# Patient Record
Sex: Female | Born: 1940 | Race: White | Hispanic: No
Health system: Southern US, Community
[De-identification: ages and names within clinical notes are randomized; demographics above are authoritative.]

## PROBLEM LIST (undated history)

## (undated) DIAGNOSIS — T4145XA Adverse effect of unspecified anesthetic, initial encounter: Secondary | ICD-10-CM

## (undated) DIAGNOSIS — I4891 Unspecified atrial fibrillation: Secondary | ICD-10-CM

## (undated) DIAGNOSIS — F419 Anxiety disorder, unspecified: Secondary | ICD-10-CM

## (undated) DIAGNOSIS — I10 Essential (primary) hypertension: Secondary | ICD-10-CM

## (undated) DIAGNOSIS — E538 Deficiency of other specified B group vitamins: Secondary | ICD-10-CM

## (undated) DIAGNOSIS — E785 Hyperlipidemia, unspecified: Secondary | ICD-10-CM

## (undated) DIAGNOSIS — E039 Hypothyroidism, unspecified: Secondary | ICD-10-CM

## (undated) DIAGNOSIS — F329 Major depressive disorder, single episode, unspecified: Secondary | ICD-10-CM

## (undated) DIAGNOSIS — T8859XA Other complications of anesthesia, initial encounter: Secondary | ICD-10-CM

## (undated) DIAGNOSIS — K219 Gastro-esophageal reflux disease without esophagitis: Secondary | ICD-10-CM

## (undated) DIAGNOSIS — G4730 Sleep apnea, unspecified: Secondary | ICD-10-CM

## (undated) DIAGNOSIS — F32A Depression, unspecified: Secondary | ICD-10-CM

## (undated) DIAGNOSIS — J069 Acute upper respiratory infection, unspecified: Secondary | ICD-10-CM

## (undated) DIAGNOSIS — I509 Heart failure, unspecified: Secondary | ICD-10-CM

## (undated) DIAGNOSIS — E271 Primary adrenocortical insufficiency: Secondary | ICD-10-CM

## (undated) DIAGNOSIS — G43909 Migraine, unspecified, not intractable, without status migrainosus: Secondary | ICD-10-CM

## (undated) DIAGNOSIS — M1990 Unspecified osteoarthritis, unspecified site: Secondary | ICD-10-CM

## (undated) DIAGNOSIS — R002 Palpitations: Secondary | ICD-10-CM

## (undated) DIAGNOSIS — I2510 Atherosclerotic heart disease of native coronary artery without angina pectoris: Secondary | ICD-10-CM

## (undated) DIAGNOSIS — R0602 Shortness of breath: Secondary | ICD-10-CM

## (undated) DIAGNOSIS — N390 Urinary tract infection, site not specified: Secondary | ICD-10-CM

## (undated) DIAGNOSIS — Z933 Colostomy status: Secondary | ICD-10-CM

## (undated) DIAGNOSIS — J42 Unspecified chronic bronchitis: Secondary | ICD-10-CM

## (undated) DIAGNOSIS — M5416 Radiculopathy, lumbar region: Secondary | ICD-10-CM

## (undated) DIAGNOSIS — M549 Dorsalgia, unspecified: Secondary | ICD-10-CM

## (undated) DIAGNOSIS — E559 Vitamin D deficiency, unspecified: Secondary | ICD-10-CM

## (undated) HISTORY — PX: TONSILLECTOMY: SUR1361

## (undated) HISTORY — DX: Unspecified osteoarthritis, unspecified site: M19.90

## (undated) HISTORY — PX: JOINT REPLACEMENT: SHX530

## (undated) HISTORY — PX: REVISION TOTAL KNEE ARTHROPLASTY: SUR1280

## (undated) HISTORY — PX: TOTAL KNEE ARTHROPLASTY: SHX125

## (undated) HISTORY — PX: CATARACT EXTRACTION, BILATERAL: SHX1313

## (undated) HISTORY — DX: Deficiency of other specified B group vitamins: E53.8

## (undated) HISTORY — DX: Unspecified atrial fibrillation: I48.91

## (undated) HISTORY — PX: CARPAL TUNNEL RELEASE: SHX101

## (undated) HISTORY — DX: Heart failure, unspecified: I50.9

## (undated) HISTORY — DX: Vitamin D deficiency, unspecified: E55.9

## (undated) HISTORY — PX: LAPAROSCOPIC CHOLECYSTECTOMY: SUR755

## (undated) HISTORY — DX: Dorsalgia, unspecified: M54.9

## (undated) HISTORY — DX: Radiculopathy, lumbar region: M54.16

## (undated) HISTORY — DX: Palpitations: R00.2

## (undated) HISTORY — PX: KNEE ARTHROSCOPY: SHX127

## (undated) HISTORY — DX: Shortness of breath: R06.02

## (undated) HISTORY — PX: COLON SURGERY: SHX602

## (undated) HISTORY — PX: BACK SURGERY: SHX140

## (undated) HISTORY — PX: DIAGNOSTIC LAPAROSCOPY: SUR761

## (undated) HISTORY — PX: DILATION AND CURETTAGE OF UTERUS: SHX78

---

## 1978-04-03 HISTORY — PX: ANTERIOR LUMBAR FUSION: SHX1170

## 1997-12-03 ENCOUNTER — Ambulatory Visit (HOSPITAL_COMMUNITY): Admission: RE | Admit: 1997-12-03 | Discharge: 1997-12-03 | Payer: Self-pay | Admitting: Orthopedic Surgery

## 1998-05-26 ENCOUNTER — Ambulatory Visit (HOSPITAL_COMMUNITY): Admission: RE | Admit: 1998-05-26 | Discharge: 1998-05-26 | Payer: Self-pay | Admitting: Cardiology

## 1998-05-26 ENCOUNTER — Encounter: Payer: Self-pay | Admitting: Cardiology

## 1998-10-14 ENCOUNTER — Ambulatory Visit (HOSPITAL_BASED_OUTPATIENT_CLINIC_OR_DEPARTMENT_OTHER): Admission: RE | Admit: 1998-10-14 | Discharge: 1998-10-14 | Payer: Self-pay | Admitting: Orthopedic Surgery

## 1999-05-10 ENCOUNTER — Ambulatory Visit (HOSPITAL_BASED_OUTPATIENT_CLINIC_OR_DEPARTMENT_OTHER): Admission: RE | Admit: 1999-05-10 | Discharge: 1999-05-10 | Payer: Self-pay | Admitting: Orthopedic Surgery

## 1999-05-11 ENCOUNTER — Encounter: Payer: Self-pay | Admitting: Cardiology

## 1999-05-11 ENCOUNTER — Encounter: Admission: RE | Admit: 1999-05-11 | Discharge: 1999-05-11 | Payer: Self-pay | Admitting: Cardiology

## 1999-06-20 ENCOUNTER — Other Ambulatory Visit: Admission: RE | Admit: 1999-06-20 | Discharge: 1999-06-20 | Payer: Self-pay | Admitting: Obstetrics and Gynecology

## 1999-06-27 ENCOUNTER — Ambulatory Visit (HOSPITAL_COMMUNITY): Admission: RE | Admit: 1999-06-27 | Discharge: 1999-06-27 | Payer: Self-pay | Admitting: Neurosurgery

## 1999-06-27 ENCOUNTER — Encounter: Payer: Self-pay | Admitting: Neurosurgery

## 2000-06-26 ENCOUNTER — Other Ambulatory Visit: Admission: RE | Admit: 2000-06-26 | Discharge: 2000-06-26 | Payer: Self-pay | Admitting: Obstetrics and Gynecology

## 2000-07-04 ENCOUNTER — Ambulatory Visit (HOSPITAL_COMMUNITY): Admission: RE | Admit: 2000-07-04 | Discharge: 2000-07-04 | Payer: Self-pay | Admitting: Obstetrics and Gynecology

## 2000-07-04 ENCOUNTER — Encounter: Payer: Self-pay | Admitting: Obstetrics and Gynecology

## 2000-07-10 ENCOUNTER — Ambulatory Visit (HOSPITAL_BASED_OUTPATIENT_CLINIC_OR_DEPARTMENT_OTHER): Admission: RE | Admit: 2000-07-10 | Discharge: 2000-07-10 | Payer: Self-pay | Admitting: Plastic Surgery

## 2000-07-10 ENCOUNTER — Encounter (INDEPENDENT_AMBULATORY_CARE_PROVIDER_SITE_OTHER): Payer: Self-pay | Admitting: Specialist

## 2001-07-17 ENCOUNTER — Other Ambulatory Visit: Admission: RE | Admit: 2001-07-17 | Discharge: 2001-07-17 | Payer: Self-pay | Admitting: Obstetrics and Gynecology

## 2001-09-08 ENCOUNTER — Emergency Department (HOSPITAL_COMMUNITY): Admission: EM | Admit: 2001-09-08 | Discharge: 2001-09-08 | Payer: Self-pay

## 2002-12-17 ENCOUNTER — Encounter: Payer: Self-pay | Admitting: Specialist

## 2002-12-19 ENCOUNTER — Inpatient Hospital Stay (HOSPITAL_COMMUNITY): Admission: RE | Admit: 2002-12-19 | Discharge: 2002-12-25 | Payer: Self-pay | Admitting: Specialist

## 2002-12-21 ENCOUNTER — Encounter: Payer: Self-pay | Admitting: Specialist

## 2003-12-28 ENCOUNTER — Encounter: Admission: RE | Admit: 2003-12-28 | Discharge: 2003-12-28 | Payer: Self-pay | Admitting: Internal Medicine

## 2004-10-18 ENCOUNTER — Ambulatory Visit (HOSPITAL_COMMUNITY): Admission: RE | Admit: 2004-10-18 | Discharge: 2004-10-18 | Payer: Self-pay | Admitting: Orthopedic Surgery

## 2004-10-18 ENCOUNTER — Ambulatory Visit (HOSPITAL_BASED_OUTPATIENT_CLINIC_OR_DEPARTMENT_OTHER): Admission: RE | Admit: 2004-10-18 | Discharge: 2004-10-19 | Payer: Self-pay | Admitting: Orthopedic Surgery

## 2005-04-14 ENCOUNTER — Inpatient Hospital Stay (HOSPITAL_COMMUNITY): Admission: RE | Admit: 2005-04-14 | Discharge: 2005-04-19 | Payer: Self-pay | Admitting: Orthopedic Surgery

## 2005-04-14 ENCOUNTER — Encounter (INDEPENDENT_AMBULATORY_CARE_PROVIDER_SITE_OTHER): Payer: Self-pay | Admitting: Specialist

## 2005-04-28 ENCOUNTER — Ambulatory Visit (HOSPITAL_COMMUNITY): Admission: RE | Admit: 2005-04-28 | Discharge: 2005-04-28 | Payer: Self-pay | Admitting: Internal Medicine

## 2005-05-23 ENCOUNTER — Ambulatory Visit (HOSPITAL_COMMUNITY): Admission: RE | Admit: 2005-05-23 | Discharge: 2005-05-23 | Payer: Self-pay | Admitting: Internal Medicine

## 2005-11-22 ENCOUNTER — Ambulatory Visit (HOSPITAL_COMMUNITY): Admission: RE | Admit: 2005-11-22 | Discharge: 2005-11-22 | Payer: Self-pay | Admitting: Neurosurgery

## 2006-03-07 ENCOUNTER — Emergency Department (HOSPITAL_COMMUNITY): Admission: EM | Admit: 2006-03-07 | Discharge: 2006-03-07 | Payer: Self-pay | Admitting: Emergency Medicine

## 2007-01-16 ENCOUNTER — Ambulatory Visit: Payer: Self-pay | Admitting: Gastroenterology

## 2007-01-30 ENCOUNTER — Ambulatory Visit: Payer: Self-pay | Admitting: Gastroenterology

## 2007-01-30 ENCOUNTER — Encounter: Payer: Self-pay | Admitting: Gastroenterology

## 2007-03-22 ENCOUNTER — Encounter
Admission: RE | Admit: 2007-03-22 | Discharge: 2007-03-22 | Payer: Self-pay | Admitting: Physical Medicine and Rehabilitation

## 2007-04-30 ENCOUNTER — Encounter: Admission: RE | Admit: 2007-04-30 | Discharge: 2007-04-30 | Payer: Self-pay | Admitting: Endocrinology

## 2007-05-30 ENCOUNTER — Encounter: Admission: RE | Admit: 2007-05-30 | Discharge: 2007-05-30 | Payer: Self-pay | Admitting: Internal Medicine

## 2008-10-21 ENCOUNTER — Inpatient Hospital Stay (HOSPITAL_COMMUNITY): Admission: RE | Admit: 2008-10-21 | Discharge: 2008-10-26 | Payer: Self-pay | Admitting: Orthopedic Surgery

## 2008-11-17 DIAGNOSIS — I10 Essential (primary) hypertension: Secondary | ICD-10-CM | POA: Insufficient documentation

## 2008-11-17 DIAGNOSIS — E785 Hyperlipidemia, unspecified: Secondary | ICD-10-CM | POA: Insufficient documentation

## 2008-11-18 ENCOUNTER — Ambulatory Visit: Payer: Self-pay | Admitting: Critical Care Medicine

## 2008-11-18 DIAGNOSIS — R918 Other nonspecific abnormal finding of lung field: Secondary | ICD-10-CM | POA: Insufficient documentation

## 2008-11-18 DIAGNOSIS — M1990 Unspecified osteoarthritis, unspecified site: Secondary | ICD-10-CM | POA: Insufficient documentation

## 2008-11-22 ENCOUNTER — Encounter: Payer: Self-pay | Admitting: Critical Care Medicine

## 2009-07-31 ENCOUNTER — Emergency Department (HOSPITAL_COMMUNITY): Admission: EM | Admit: 2009-07-31 | Discharge: 2009-07-31 | Payer: Self-pay | Admitting: Family Medicine

## 2009-08-25 ENCOUNTER — Ambulatory Visit (HOSPITAL_COMMUNITY): Admission: RE | Admit: 2009-08-25 | Discharge: 2009-08-26 | Payer: Self-pay | Admitting: Orthopedic Surgery

## 2009-11-07 ENCOUNTER — Emergency Department (HOSPITAL_COMMUNITY): Admission: EM | Admit: 2009-11-07 | Discharge: 2009-11-07 | Payer: Self-pay | Admitting: Emergency Medicine

## 2009-11-07 ENCOUNTER — Emergency Department (HOSPITAL_COMMUNITY): Admission: EM | Admit: 2009-11-07 | Discharge: 2009-11-07 | Payer: Self-pay | Admitting: Family Medicine

## 2009-11-29 ENCOUNTER — Encounter: Payer: Self-pay | Admitting: Gastroenterology

## 2010-05-03 NOTE — Letter (Signed)
Summary: Colonoscopy Letter  [facility]  [address] [place], [place] [address]   Phone: [phone]  Fax: [phone]      November 29, 2009 MRN: [id number]   [name] [address] [place], [place]  [address]   Dear Ms. [name],   According to your medical record, it is time for you to schedule a Colonoscopy. The American Cancer Society recommends this procedure as a method to detect early colon cancer. Patients with a family history of colon cancer, or a personal history of colon polyps or inflammatory bowel disease are at increased risk.  This letter has been generated based on the recommendations made at the time of your procedure. If you feel that in your particular situation this may no longer apply, please contact our office.  Please call our office at [phone] to schedule this appointment or to update your records at your earliest convenience.  Thank you for cooperating with [place] to provide you with the very best care possible.   Sincerely,  Rachael Fee, M.D.  [facility] [phone]

## 2010-06-17 LAB — CBC
HCT: 37 % (ref 36.0–46.0)
MCV: 88.7 fL (ref 78.0–100.0)
Platelets: 223 10*3/uL (ref 150–400)
RBC: 4.17 MIL/uL (ref 3.87–5.11)
RDW: 12.5 % (ref 11.5–15.5)
WBC: 7.8 10*3/uL (ref 4.0–10.5)

## 2010-06-17 LAB — URINALYSIS, ROUTINE W REFLEX MICROSCOPIC
Bilirubin Urine: NEGATIVE
Glucose, UA: NEGATIVE mg/dL
Hgb urine dipstick: NEGATIVE
Ketones, ur: NEGATIVE mg/dL
Nitrite: NEGATIVE
Protein, ur: NEGATIVE mg/dL
Specific Gravity, Urine: 1.02 (ref 1.005–1.030)
Urobilinogen, UA: 1 mg/dL (ref 0.0–1.0)
pH: 6 (ref 5.0–8.0)

## 2010-06-17 LAB — DIFFERENTIAL
Basophils Absolute: 0 10*3/uL (ref 0.0–0.1)
Basophils Relative: 0 % (ref 0–1)
Eosinophils Absolute: 0.3 10*3/uL (ref 0.0–0.7)
Monocytes Absolute: 0.8 10*3/uL (ref 0.1–1.0)

## 2010-06-17 LAB — URINE MICROSCOPIC-ADD ON

## 2010-06-17 LAB — POCT CARDIAC MARKERS
CKMB, poc: <1 ng/mL — ABNORMAL LOW (ref 1.0–8.0)
Myoglobin, poc: 38.6 ng/mL (ref 12–200)
Troponin i, poc: <0.05 ng/mL (ref 0.00–0.09)

## 2010-06-17 LAB — COMPREHENSIVE METABOLIC PANEL
ALT: 20 U/L (ref 0–35)
Alkaline Phosphatase: 74 U/L (ref 39–117)
CO2: 25 mEq/L (ref 19–32)
Calcium: 9.3 mg/dL (ref 8.4–10.5)
Glucose, Bld: 105 mg/dL — ABNORMAL HIGH (ref 70–99)
Potassium: 4 mEq/L (ref 3.5–5.1)

## 2010-06-20 LAB — COMPREHENSIVE METABOLIC PANEL
Albumin: 3.4 g/dL — ABNORMAL LOW (ref 3.5–5.2)
BUN: 11 mg/dL (ref 6–23)
Creatinine, Ser: 0.78 mg/dL (ref 0.4–1.2)
Total Bilirubin: 0.6 mg/dL (ref 0.3–1.2)
Total Protein: 6.5 g/dL (ref 6.0–8.3)

## 2010-06-20 LAB — URINALYSIS, ROUTINE W REFLEX MICROSCOPIC
Bilirubin Urine: NEGATIVE
Hgb urine dipstick: NEGATIVE
Ketones, ur: NEGATIVE mg/dL
Protein, ur: NEGATIVE mg/dL
Urobilinogen, UA: 1 mg/dL (ref 0.0–1.0)
pH: 5 (ref 5.0–8.0)

## 2010-06-20 LAB — CBC
HCT: 37.5 % (ref 36.0–46.0)
MCV: 92.8 fL (ref 78.0–100.0)
Platelets: 253 10*3/uL (ref 150–400)
RDW: 12.8 % (ref 11.5–15.5)

## 2010-06-20 LAB — PROTIME-INR
INR: 1.01 (ref 0.00–1.49)
Prothrombin Time: 13.2 seconds (ref 11.6–15.2)

## 2010-06-20 LAB — APTT: aPTT: 26 seconds (ref 24–37)

## 2010-06-21 LAB — POCT I-STAT, CHEM 8
BUN: 11 mg/dL (ref 6–23)
Calcium, Ion: 1.09 mmol/L — ABNORMAL LOW (ref 1.12–1.32)
Chloride: 99 mEq/L (ref 96–112)
Potassium: 4 mEq/L (ref 3.5–5.1)
Sodium: 135 mEq/L (ref 135–145)

## 2010-06-21 LAB — CBC
Hemoglobin: 13.8 g/dL (ref 12.0–15.0)
RBC: 4.25 MIL/uL (ref 3.87–5.11)
RDW: 12.1 % (ref 11.5–15.5)

## 2010-06-21 LAB — DIFFERENTIAL
Basophils Absolute: 0 10*3/uL (ref 0.0–0.1)
Lymphocytes Relative: 14 % (ref 12–46)
Monocytes Absolute: 1.1 10*3/uL — ABNORMAL HIGH (ref 0.1–1.0)
Neutro Abs: 7.6 10*3/uL (ref 1.7–7.7)
Neutrophils Relative %: 75 % (ref 43–77)

## 2010-07-10 LAB — CBC
HCT: 30.2 % — ABNORMAL LOW (ref 36.0–46.0)
HCT: 32.3 % — ABNORMAL LOW (ref 36.0–46.0)
Hemoglobin: 10.3 g/dL — ABNORMAL LOW (ref 12.0–15.0)
Hemoglobin: 11.1 g/dL — ABNORMAL LOW (ref 12.0–15.0)
Hemoglobin: 13.5 g/dL (ref 12.0–15.0)
MCHC: 34.1 g/dL (ref 30.0–36.0)
MCHC: 35 g/dL (ref 30.0–36.0)
MCHC: 34.5 g/dL (ref 30.0–36.0)
MCV: 93 fL (ref 78.0–100.0)
MCV: 93.1 fL (ref 78.0–100.0)
MCV: 93.4 fL (ref 78.0–100.0)
Platelets: 214 10*3/uL (ref 150–400)
Platelets: 243 10*3/uL (ref 150–400)
Platelets: 260 10*3/uL (ref 150–400)
RBC: 3.23 MIL/uL — ABNORMAL LOW (ref 3.87–5.11)
RDW: 12.4 % (ref 11.5–15.5)
RDW: 12.5 % (ref 11.5–15.5)
RDW: 12.3 % (ref 11.5–15.5)
WBC: 15.9 10*3/uL — ABNORMAL HIGH (ref 4.0–10.5)

## 2010-07-10 LAB — URINE MICROSCOPIC-ADD ON

## 2010-07-10 LAB — COMPREHENSIVE METABOLIC PANEL
ALT: 20 U/L (ref 0–35)
AST: 26 U/L (ref 0–37)
Albumin: 3.6 g/dL (ref 3.5–5.2)
Alkaline Phosphatase: 84 U/L (ref 39–117)
BUN: 11 mg/dL (ref 6–23)
Calcium: 9.1 mg/dL (ref 8.4–10.5)
GFR calc Af Amer: >60 mL/min (ref >60)
GFR calc non Af Amer: >60 mL/min (ref >60)
Potassium: 3.5 mEq/L (ref 3.5–5.1)
Sodium: 139 mEq/L (ref 135–145)
Total Bilirubin: 0.7 mg/dL (ref 0.3–1.2)
Total Protein: 6.6 g/dL (ref 6.0–8.3)

## 2010-07-10 LAB — URINALYSIS, ROUTINE W REFLEX MICROSCOPIC
Bilirubin Urine: NEGATIVE
Glucose, UA: NEGATIVE mg/dL
Nitrite: NEGATIVE
Protein, ur: NEGATIVE mg/dL
Specific Gravity, Urine: 1.013 (ref 1.005–1.030)
Specific Gravity, Urine: 1.017 (ref 1.005–1.030)
Urobilinogen, UA: 0.2 mg/dL (ref 0.0–1.0)
pH: 6 (ref 5.0–8.0)
pH: 5.5 (ref 5.0–8.0)

## 2010-07-10 LAB — GLUCOSE, CAPILLARY: Glucose-Capillary: 157 mg/dL — ABNORMAL HIGH (ref 70–99)

## 2010-07-10 LAB — BASIC METABOLIC PANEL
BUN: 11 mg/dL (ref 6–23)
CO2: 26 mEq/L (ref 19–32)
CO2: 27 mEq/L (ref 19–32)
Calcium: 8.3 mg/dL — ABNORMAL LOW (ref 8.4–10.5)
Chloride: 103 mEq/L (ref 96–112)
Chloride: 106 mEq/L (ref 96–112)
Creatinine, Ser: 0.63 mg/dL (ref 0.4–1.2)
GFR calc Af Amer: >60 mL/min (ref >60)
Glucose, Bld: 179 mg/dL — ABNORMAL HIGH (ref 70–99)
Potassium: 4.1 mEq/L (ref 3.5–5.1)
Sodium: 137 mEq/L (ref 135–145)
Sodium: 140 mEq/L (ref 135–145)

## 2010-07-10 LAB — CARDIAC PANEL(CRET KIN+CKTOT+MB+TROPI): Relative Index: 1.7 (ref 0.0–2.5)

## 2010-07-10 LAB — PROTIME-INR
INR: 1.5 (ref 0.00–1.49)
INR: 2.1 — ABNORMAL HIGH (ref 0.00–1.49)
Prothrombin Time: 18.6 seconds — ABNORMAL HIGH (ref 11.6–15.2)
Prothrombin Time: 24.1 seconds — ABNORMAL HIGH (ref 11.6–15.2)
Prothrombin Time: 13 seconds (ref 11.6–15.2)

## 2010-07-10 LAB — URINE CULTURE
Colony Count: NO GROWTH
Special Requests: NEGATIVE

## 2010-07-10 LAB — TYPE AND SCREEN
ABO/RH(D): B POS
Antibody Screen: NEGATIVE

## 2010-08-16 NOTE — Op Note (Signed)
[name], [name]              ACCOUNT NO.:  [id number]   MEDICAL RECORD NO.:  [id number]          PATIENT TYPE:  INP   LOCATION:  0009                         FACILITY:  [facility]   PHYSICIAN:  Ollen Gross, M.D.    DATE OF BIRTH:  [birth year]   DATE OF PROCEDURE:  10/21/2008  DATE OF DISCHARGE:                               OPERATIVE REPORT   PREOPERATIVE DIAGNOSIS:  Osteoarthritis, left knee.   POSTOPERATIVE DIAGNOSIS:  Osteoarthritis, left knee.   PROCEDURE:  Left total knee arthroplasty.   SURGEON:  Aluisio.   ASSISTANT:  Avel Peace PA-C.   ANESTHESIA:  Spinal.   ESTIMATED BLOOD LOSS:  Minimal.   DRAINS:  None.   TOURNIQUET TIME:  26 minutes at 3 mmHg.   COMPLICATIONS:  None.   CONDITION:  Stable to recovery.   BRIEF CLINICAL NOTE:  [name] is a 70 year old female with end-  stage arthritis, left knee, progressively worsening pain and  dysfunction.  She has failed nonoperative management, including  injections and presents for total knee arthroplasty.   PROCEDURE IN DETAIL:  After successful administration of spinal  anesthetic, a tourniquet is placed on her left thigh, and her left lower  extremity is prepped and draped in usual sterile fashion.  The extremity  is wrapped in Esmarch, knee flexed, tourniquet inflated to 300 mmHg.  Midline incision was made with a 10 blade through the subcutaneous  tissue to the level of the extensor mechanism.  A fresh blade is used to  make a medial parapatellar arthrotomy.  Soft tissue on the proximal  medial tibia is subperiosteally elevated to the joint line with the  knife into the semimembranosus bursa with a Cobb elevator.  Soft tissue  laterally is elevated with attention being paid to avoid the patellar  tendon on the tibial tubercle.  The patella subluxed laterally, knee  flexed 90 degrees, and ACL and PCL removed.  A drill was used to create  a starting hole in the distal femur, and the canal was thoroughly  irrigated.  A 5-degree left valgus alignment guide is placed.  Referencing off the posterior condyles, rotations were marked, and a  black pinned to remove 10 mm off the distal femur.  Distal femoral  resection is made with an oscillating saw.  Sizing blocks were placed  and size 3 is most appropriate.  Rotation is marked at the epicondylar  axis.  A Size 3 cutting block is placed, and the anterior, posterior,  and chamfer cuts are made.   The tibia is subluxed forward and menisci are removed.  Extramedullary  tibial alignment guide is placed, referencing proximally at the medial  aspect of the tibial tubercle and distally along the second metatarsal  axis and tibial crest.  Blocks pinned to remove 2 mm off the more  deficient medial side.  Tibial resection is made with an oscillating  saw.  Size 3 is the most appropriate tibial component.  Proximal tibia  is prepared with a modular drill and keel punch for the size 3.  Femoral  preparation is completed with the intercondylar cut.  Size 3 mobile bearing tibial trial and size 3 posterior stabilized  femoral trial, and a 10 mm posterior stabilized rotating platform insert  trial are placed.  With a 10, full extension is achieved with excellent  varus-valgus and anterior-posterior balance throughout full range of  motion.  Patella was everted, and thickness measured to be 23 mm.  Freehand resection is taken at 13 mm, a 38 template is placed, lug holes  were drilled, trial patella was placed, and it tracks normally.  Osteophytes are removed off the posterior femur with a trial in place.  All trials are removed, and cut bone surfaces are prepared with  pulsatile lavage.  The cement is mixed and once ready for implantation,  the size 3 mobile bearing tibial tray,size 3 posterior stabilized femur,  and 38 patella are cemented into place.  The patella was held a clamp.  Trial 12.5-mm inserts placed, the knee held in full extension.  All   extruded cement removed.  When the cement was fully hardened, then the  permanent 12.5 mm posterior stabilized rotating platform insert was  placed into the tibial tray.  Please note that the 12.5 was the trial  size that was also most stable.  Once the permanent one was in, we  thoroughly irrigated the joint with saline solution and then injected  FloSeal in the posterior capsule, mediolateral gutters, and  suprapatellar area.  Moist sponge is placed, and tourniquet released for  a total tourniquet time of 26 minutes.  The sponge is held for 2 minutes  and removed.  Minimal bleeding is encountered.  The bleeding that is  encountered is stopped with electrocautery.  The wound is again  irrigated to remove the FloSeal, and then the arthrotomy closed with  interrupted #1 PDS.  Flexion against gravity is 140 degrees.  Subcu is  closed with interrupted 2-0 Vicryl, and subcuticular running 4-0  Monocryl.  The incision is cleaned and dried and Steri-Strips and a  bulky sterile dressing were applied.  She was then placed into a knee  immobilizer, awakened, and transported to recovery in stable condition.      Ollen Gross, M.D.  Electronically Signed     FA/MEDQ  D:  10/21/2008  T:  10/21/2008  Job:  [id number]

## 2010-08-16 NOTE — Consult Note (Signed)
[name], Scott              ACCOUNT NO.:  [id number]   MEDICAL RECORD NO.:  [id number]          PATIENT TYPE:  INP   LOCATION:  1616                         FACILITY:  [facility]   PHYSICIAN:  Francisca December, M.D.  DATE OF BIRTH:  1941/03/18   DATE OF CONSULTATION:  10/21/2008  DATE OF DISCHARGE:                                 CONSULTATION   REFERRING PHYSICIAN:  Ollen Gross, M.D.   REASON FOR CONSULTATION:  Episode of bradycardia.   FINDINGS:  1. Episode of hypotension, bradycardia 3 hours following spinal      anesthesia, etiology uncertain.  2. No associated symptoms with above, no hypoxia.  3. Status post TKR today.  4. 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  5. Pituitary microadenoma.  6. Hypertension.  7. Hyperlipidemia.  8. Osteoarthritis.  9. Osteopenia.  10.History of atrial bigeminy  11.Normal pharmacologic stress Cardiolite performed March 2010.   RECOMMENDATIONS:  1. Agree with your measures thus far which include saline bolus and      transfer to telemetry.  2. Cycle cardiac enzymes.  3. Agree with stress dose steroids.  4. Any further episodes especially involving any chest discomfort or      hypoxemia would recommend CT angiogram.   FINDINGS:  [name] is a pleasant 70 year old woman who underwent  today a left TKR which was uncomplicated and had an unremarkable  recovery in PACU.  After transfer to the ward while on O2 saturation  monitor she was noted to drop her heart rate into the 30s and then  became briefly hypotensive with systolic blood pressure in the 80s.  This episode lasted approximately 10-15 minutes.  She was given IV  saline bolus.  At the time of my evaluation now 90 minutes later, she  has normal heart rate and blood pressure.  The patient denied any  associated symptoms with this, specifically any chest discomfort,  shortness of breath, diaphoresis, nausea or visual changes.  She feels  fine  currently.  Her anesthetic from her knee surgery has yet to  completely wear off.   MEDICATIONS AS AN OUTPATIENT:  1. Aspirin 81 mg p.o. daily.  2. Flexeril 10 mg p.o. daily.  3. Diovan 80 mg p.o. daily.  4. Metoprolol 25 mg p.o. b.i.d.  5. Prozac 20 mg p.o. daily.  6. Fosamax 70 mg p.o. every week.  7. Simvastatin 40 mg p.o. daily.  8. Prilosec 20 mg p.o. daily.  9. Vitamin D 50,000 units weekly.   PHYSICAL EXAMINATION:  Blood pressure currently 94/55, pulse is 68 and  regular, respiratory rate 16, temperature 97.7.  GENERAL:  This is a well-appearing comfortable, supine Caucasian female  in no distress.  HEENT:  Unremarkable.  NECK:  Supple without thyromegaly or masses.  Carotid upstrokes are  normal without bruit.  There is no JVD.  CHEST:  Clear with adequate excursion.  HEART:  Has an irregularly irregular rhythm in the form of a bigeminal  pattern.  No murmur, click or rub is noted.  ABDOMEN:  Mildly obese, soft, nontender.  No midline pulsatile mass.  Bowel sounds present in all quadrants.  The left leg is in the postop  apparatus.  The right leg is without edema and has intact distal pulses.  NEUROLOGIC:  Exam is nonfocal.   Electrocardiogram shows sinus rhythm with atrial bigeminy and  nonspecific ST-T wave abnormality.   COMMENTS:  The etiology of this episode is unclear but probably related  to slow resolution of her spinal anesthetic.  I do not feel further  evaluation other than obtaining cardiac enzymes would be appropriate  given her lack of any associated symptoms and no hypoxemia.      Francisca December, M.D.  Electronically Signed     JHE/MEDQ  D:  10/21/2008  T:  10/21/2008  Job:  [id number]   cc:   Ollen Gross, M.D.  Fax: [phone]   Colleen Can. Deborah Chalk, M.D.  Fax: [phone]

## 2010-08-19 NOTE — Discharge Summary (Signed)
NAMEMARVELYN, Scott              ACCOUNT NO.:  [id number]   MEDICAL RECORD NO.:  [id number]          PATIENT TYPE:  INP   LOCATION:  1516                         [facility]ACILITY:  [facility]   PHYSICIAN:  Ollen Gross, M.D.    DATE OF BIRTH:  1940/12/14   DATE OF ADMISSION:  04/14/2005  DATE OF DISCHARGE:  04/19/2005                                 DISCHARGE SUMMARY   DISCHARGE DIAGNOSIS:  Painful, stiff right total knee arthroplasty.   OTHER DIAGNOSES:  1.  Central adrenal insufficiency.  2.  Hypertension.  3.  Fibromyalgia.  4.  Arthritis.   CONSULTS:  Dr. Guerry Bruin, internal medicine.   PROCEDURE:  Right total knee arthroplasty, April 14, 2005.   HISTORY OF PRESENT ILLNESS:  [name] is a 70 year old female who  originally underwent a right total knee arthroplasty approximately two years  prior to the present admission.  She has had progressively worsening pain  and stiffness in this right knee.  She has worsening condition over the past  year or so.  Bone scan was suggestive of loosening versus possible  infection.  Lab work was negative for infection.  She presents now for  revision versus resection arthroplasty of her right total knee.   HOSPITAL COURSE:  [name] was admitted via the operating room on  April 14, 2005, at which time she underwent a revision right total knee  arthroplasty.  Intraoperatively, her Gram's stain was negative, and multiple  frozen sections showed no evidence of acute inflammation.  She underwent the  procedure under general anesthesia with a postop Marcaine pain pump.  She  did extremely well with the operation.  Postoperatively, it was noted that  she was hypotensive with systolics running approximately 90 and diastolics  were running 40-50.  She has had this problem with previous surgeries.  She  was fluid resuscitated, and Dr. Wylene Simmer was consulted.  He felt that it was  possible that this may have been adrenal insufficiency  causing it.  Her  blood pressure medications were held.  On postoperative day #1, her pressure  was doing better.  She was still borderline low but not as low as the  initial postoperative night.  She had a hemoglobin of 10.3, and BMET was  within normal limits.  She was kept on bedrest and just bed to chair the  first day.   On postoperative day #2, the pressure was starting to normalize.  Hemoglobin  was 8.9.  She was starting to feel better as of postoperative day #2.  Her  PCA pump and Marcaine pain pump were discontinued.  She was placed on  hydrocortisone by Dr. Wylene Simmer, and she started to respond to that.   By postoperative day #3, blood pressure was 120/70.  She is kept on  steroids.  She was doing well with her physical therapy at this time.  Hemoglobin had leveled off at 8.6.  It was felt that she did not need a  transfusion.   By April 18, 2005, she was participating actively with physical therapy  and ready for discharge by April 19, 2005.  Her hemoglobin at that time  was 8.4 with a normal BMET and INR of 1.8.  She was ambulating over 120 feet  under supervised condition with the ability to go up and down two stairs.  She was cleared medically for discharge by Dr. Wylene Simmer.  She was  subsequently discharged home on April 19, 2005.   DISPOSITION:  Discharged home on April 19, 2005.  Tolerating a regular  diet.  In stable condition.  She is discharged home on Coumadin, Percocet,  Robaxin, and her preoperative medications.   FOLLOW UP:  In two weeks in the office with Dr. Despina Hick.  She will have home  health PT and home health RN for pro times.  A Coumadin will be in order for  three weeks postoperatively with an INR level to be monitored between 2-3.  We will see her back in two weeks.      Ollen Gross, M.D.  Electronically Signed     FA/MEDQ  D:  06/05/2005  T:  06/06/2005  Job:  [id number]   cc:   Gaspar Garbe, M.D.  Fax: [phone]

## 2010-08-19 NOTE — Op Note (Signed)
NAMECLAUDETTA, [name]              ACCOUNT NO.:  [id number]   MEDICAL RECORD NO.:  [id number]          PATIENT TYPE:  AMB   LOCATION:  DSC                          FACILITY:  [facility]   PHYSICIAN:  Leonides Grills, M.D.     DATE OF BIRTH:  12/31/1940   DATE OF PROCEDURE:  10/18/2004  DATE OF DISCHARGE:                                 OPERATIVE REPORT   PREOPERATIVE DIAGNOSIS:  Left great toe metatarsophalangeal joint arthritis.   POSTOPERATIVE DIAGNOSIS:  Left great toe metatarsophalangeal joint  arthritis.   OPERATION PERFORMED:  1.  Left great toe metatarsophalangeal joint fusion.  2.  Local bone graft.  3.  Stress x-rays, left foot.   SURGEON:  Leonides Grills, M.D.   ANESTHESIA:  General.   COMPLICATIONS:  None.   DISPOSITION:  Stable to PR.   INDICATIONS FOR PROCEDURE:  The patient is a 70 year old female who has had  longstanding left great toe pain that was interfering with her life to the  point where she could not do what she wants to do.  The patient  has  consented for the above procedure.  All risks which include infection,  neurovascular injury, nonunion, malunion, hardware irritation, hardware  failure, persistent pain, worsening pain, stiffness, arthritis and prolonged  recovery were all explained, questions were encouraged and answered.   DESCRIPTION OF PROCEDURE:  The patient was brought to the operating room and  placed in supine position after adequate general endotracheal tube  anesthesia was administered as well as Ancef 1 g IV piggyback.  The left  lower extremity was then prepped and draped in a sterile manner over a  proximally placed thigh tourniquet. The limb was gravity exsanguinated.  The  tourniquet was elevated at 290 mmHg.  A longitudinal incision over the  previous medial incision was then made.  Dissection was carried down through  the skin and hemostasis was obtained.  Neurovascular structures were  identified both superiorly and inferiorly and  protected throughout the case.  Capsulotomy was then made.  The first MTP was then entered.  There was  minimal if any cartilage left and this was curetted out with the curette and  a curved quarter inch osteotome.  The joint was then copiously irrigated  with normal saline and multiple 2 mm drill holes were placed on either side  of the joint.  The metatarsophalangeal joint was then provisionally fixed  with a 2.0 K-wire, allowing the proximal phalanx to be parallel to the  weightbearing surface of the floor.  This was then verified under C-arm  guidance in the AP and lateral planes to be in excellent position.  We then  placed two 3.5 mm full threaded cortical lag screws in a criss-cross manner  and this had excellent purchase and maintenance of the correction.  Graft  obtained through spurs around the joint as well as from drill bits were used  as local bone graft and placed back into the metatarsophalangeal joint using  a bur and placing stress-strain relieving bone graft along the joint surface  and joint line.  Final stress x-rays were obtained  in AP and lateral planes.  This showed no gross motion across the arthrodesis site.  Fixation and  proper position as well as toe is in excellent  alignment as well.  Prior to bone graft placement, the area was copiously  irrigated with normal saline.  Capsule was closed with 3-0 Vicryl.  The skin  was closed with 4-0 nylon.  Sterile dressing was applied.  A modified Jones  dressing was applied.  The patient was stable to the PR.       PB/MEDQ  D:  10/18/2004  T:  10/18/2004  Job:  [id number]

## 2010-08-19 NOTE — Op Note (Signed)
NAME:  KATIE, FARAONE                        ACCOUNT NO.:  [id number]   MEDICAL RECORD NO.:  [id number]                   PATIENT TYPE:  INP   LOCATION:  2550                                 FACILITY:  [facility]   PHYSICIAN:  Erasmo Leventhal, M.D.         DATE OF BIRTH:  03-11-1941   DATE OF PROCEDURE:  12/19/2002  DATE OF DISCHARGE:                                 OPERATIVE REPORT   PREOPERATIVE DIAGNOSIS:  Right knee end-stage osteoarthritis.   POSTOPERATIVE DIAGNOSIS:  Right knee end-stage osteoarthritis.   PROCEDURE:  Right total knee arthroplasty.   SURGEON:  Ellwood Dense, M.D.   ASSISTANT:  Lajoyce Corners, M.D.   ANESTHESIA:  General with femoral nerve block.   ESTIMATED BLOOD LOSS:  Less than 50 mL.   DRAINS:  Two medium Hemovac.   COMPLICATIONS:  None.   TOURNIQUET TIME:  1 hour 50 minutes at 350 mmHg.   IMPLANTS:  Osteonics components, all cemented, Scorpio, posterior-  stabilized, size 7 femur, size 7 tibia, 12 mm flex insert with a 26 mm  polyethylene patella.   OPERATIVE DETAILS:  The patient was counseled in the holding area.  The  correct size was identified.  An IV was started and antibiotics were given.  Taken to the OR, placed in the supine position, general anesthesia, Foley  catheter placed utilizing sterile technique by OR circulating nurse.   The right knee was examined, a 5 degree flexion contracture, flexion to 120  degrees.  Elevated, prepped with Duraprep, and all draped in a sterile  fashion.  All bony prominences were properly padded and neurovascular  structures were __________ off.  The right lower extremity was exsanguinated  with an Esmarch and the tourniquet inflated to 350 mmHg.  A straight midline  incision made through the skin and subcutaneous tissue, small veins  electrocoagulated.  Medial and lateral patellar soft tissue flaps were  developed.  A medial parapatellar arthrotomy was performed and proximally a  soft tissue release  was done at the appropriate level.  The patella was  everted and the knee was flexed.  End-stage arthritic change, bone-against-  bone changes.  The cruciate ligaments were resected, starter holes made in  the distal femur, the canal was irrigated, intramedullary canal finder was  placed.  We chose a 5 degree valgus cut of the right knee and took a 10 mm  cut off the distal femur.  The distal femur was found to be a size #7.  Rotational marks were made and it was cut to fit a size #7.  The medial and  lateral tibial eminence was resected, medial and lateral menisci were  removed, geniculate vessels were coagulated.  The proximal tibia was found  to be a size #7.  A starting hole was made, the step reamer was utilized,  the intramedullary rod was gently placed.  We chose a 0 degree slope and  took an 8 mm cut off  the proximal tibia.  Posteromedial and posterolateral  femoral osteophytes were removed under direct visualization.  The patella  was reamed to appropriate depth, locking holes were made, and excess bone  was removed to fit a size #26.  A delta keel was performed in the standard  fashion on the tibial plateau.  At this time the knee was irrigated with  pulsatile lavage copiously while the cement was properly prepared.  Utilizing Modern cement technique, all components were cemented in place, a  size 7 tibia, size 7 femur, with a 26 mm patella.  I also noted that the  femoral component had been notched properly prior to this.  After the cement  had cured with a size trial of 10, we then went to a 12 mm flex insert with  excellent range of motion and well-balanced in flexion and extension.  Patellar tracking was not lateral but tilting laterally.  A modified lateral  release was performed, giving anatomic tilt and more tracking.  At this time  the trial was removed, excess cement was removed, all components were  irrigated, and then a final component, the size 12 mm flex tibial insert  was  applied, posterior-stabilized.   Bone wax placed over the exposed bony surfaces.  Two medium Hemovac drains  were placed.   The knee was closed with layers, the arthrotomy with Vicryl, subcu Vicryl,  skin closed with subcuticular Monocryl suture.  Each layer thoroughly  irrigated with antibiotic solution during the closure.  A sterile  compressive dressing applied.  Tourniquet was deflated, normal pulses in the  foot and ankle at the end of the case.  An ice pack and knee immobilizer was  applied.  She had excellent pulses in the foot and ankle at the end of the  case.  She was given another gram of Ancef intravenously as the tourniquet  was deflated.   At this point in time Dr. Jean Rosenthal administered a femoral nerve block.  The  patient was then awakened.  She was taken out of the operating room in  stable condition.  Sponge and needle count were correct.  There were no  complications.                                               Erasmo Leventhal, M.D.    RAC/MEDQ  D:  12/19/2002  T:  12/20/2002  Job:  [id number]

## 2010-08-19 NOTE — Consult Note (Signed)
NAME:  [name], [name]                        ACCOUNT NO.:  [id number]   MEDICAL RECORD NO.:  [id number]                   PATIENT TYPE:  INP   LOCATION:  3306                                 FACILITY:  [facility]   PHYSICIAN:  Colleen Can. Deborah Chalk, M.D.            DATE OF BIRTH:  1941/04/01   DATE OF CONSULTATION:  12/20/2002  DATE OF DISCHARGE:                                   CONSULTATION   HISTORY OF PRESENT ILLNESS:  Thank you very much for asking me to see Abigail  .Scott for postoperative hypotension. She had right knee replacement on  December 19, 2002. During the operation she was hypotensive and required  Neo-Synephrine. She had persistent hypotension postoperatively in spite of  having albumin  as well as 2000 mL of Crystalloid lactated Ringer's. Her  preoperative hemoglobin was in the low to mid 30s and fell to the 27 range  postoperatively. She did not have any  chest pain or shortness of breath,  but because of the persistent hypotension and what was interpreted  as PACs,  she was referred for evaluation.   Her cardiac history includes a history of hypertensive heart disease. She  had a 2D echocardiogram  done in November 2003 which showed concentric left  ventricular hypertrophy with prominence of the septum and disproportionate  upper septal hypertrophy without obstruction. She had left atrial  enlargement and essentially normal valvular function. Her ejection fraction  was 78% at the time of her  stress Cardiolite study done with pharmacologic  stress on December 17, 2002. She did have a small  left ventricular cavity  at that point in time. She  had normal perfusion. She has had a Holter  monitor in the past which has shown frequent PACs and occasional  to  frequent PVCs.   PAST MEDICAL HISTORY:  1. Her past medical history is mainly remarkable for her osteoarthritis.  2. Cholecystectomy in 1992.  3. Incisional hernia in 1993.  4. Arthroscopy on the right knee in  1988.  5. History of right foot surgery.  6. She has been hospitalized for child birth x1.  7. Hypertension since age 74.  8. History of fibromyalgia.   ALLERGIES:  SULFA CAUSES  VAGINITIS.   FAMILY HISTORY:  Her father  died at age 32 of MI. Her mother died at age 7  of hypertension and stroke. There are no brothers or sisters. She has 1 son  who is alive and well.   SOCIAL HISTORY:  She is a retired Runner, broadcasting/film/video. Her husband is an Airline pilot. She  does not use tobacco or alcohol. She lives at home with her husband.   REVIEW OF SYSTEMS:  She has had some psychosocial stressors  in her life  but basically has been doing well with that. She sees Dr. Waynard Edwards for medical  care.   MEDICATIONS:  1. She has been on 5 mg of Ziac a day.  2. Flexeril.  3. Prozac 20 mg a day.  4. Diovan 80 mg a day.  5. Celebrex.   PHYSICAL EXAMINATION:  GENERAL:  Currently she is somewhat pale but she is  alert. Peripheral pulses are excellent including  pedal pulses. She is warm  and dry. Her weight on December 15, 2002, was 191-1/2 pounds.  VITAL SIGNS:  Her blood pressure in the recovery  area was 80/50, heart rate  80.  HEENT:  Negative.  NECK:  Supple, no jugular venous distention.  LUNGS:  Clear.  HEART:  Systolic outflow murmur.  ABDOMEN:  Soft, nontender.  EXTREMITIES:  Without edema.   LABORATORY DATA:  Her EKG shows sinus rhythm with nonspecific  ST and T-wave  changes. A portable  2D echocardiogram was performed by me. It showed  excellent systolic wall motion  but a small  hyperdynamic left ventricular  cavity. There did appear to be left ventricular hypertrophy. It was  technically difficult, but then limited somewhat because of a nontach echo  done as an  emergent basis.   Hematocrit 27.   IMPRESSION:  1. Hypotension, postoperatively, probably related to vasodilatation or     possibly volume depletion.  2. Hypertensive heart disease with recent  normal Adenosine Cardiolite study      but with small  concentric hypertrophied left ventricle.  3. Postoperative from a total right knee.   PLAN:  Will expand her  volume with transfusion and will try to get her  hematocrit  above  30. We will draw cardiac enzymes, but I am almost certain  that  they will be negative. I think as anesthesia wears off, we will see  improvement in blood pressure. We probably can reinstitute blood pressure  medications early in the postoperative course.                                                Colleen Can. Deborah Chalk, M.D.    SNT/MEDQ  D:  12/20/2002  T:  12/21/2002  Job:  [id number]   cc:   Erasmo Leventhal, M.D.  [address]  [place]  [place] [address]  Fax: [phone]   Tera Mater. Evlyn Kanner, M.D.  [address]  [place]  [place] [address]  Fax: [phone]

## 2010-08-19 NOTE — Op Note (Signed)
[name], [name]              ACCOUNT NO.:  [id number]   MEDICAL RECORD NO.:  [id number]          PATIENT TYPE:  INP   LOCATION:  1516                         FACILITY:  [facility]   PHYSICIAN:  Ollen Gross, M.D.    DATE OF BIRTH:  [birth year]   DATE OF PROCEDURE:  04/14/2005  DATE OF DISCHARGE:                                 OPERATIVE REPORT   PREOPERATIVE DIAGNOSIS:  Painful stiff right total knee.   POSTOPERATIVE DIAGNOSIS:  Painful stiff right total knee.   PROCEDURE:  Right total knee arthroplasty revision.   SURGEON:  Dr. Lequita Halt   ASSISTANT:  Avel Peace, PA-C   ANESTHESIA:  General with postop Marcaine pain pump.   ESTIMATED BLOOD LOSS:  100.   DRAINS:  Hemovac x1.   TOURNIQUET TIME:  Up 60 at 300 mmHg, down 10 minutes and then up an  additional 30 minutes at 300 mmHg.   COMPLICATIONS:  None.   CONDITION:  Stable to recovery.   BRIEF CLINICAL NOTE:  [name] is a 70 year old female, had a right  total knee arthroplasty done approximately 2 years ago with Dr. Thomasena Edis.  She has had progressively increasing pain and stiffness.  She has not had  signs of infection.  There is concern about loosening given her pain and  worsening dysfunction.  She has had extensive therapy without much  improvement.  She presents now for revision versus resection arthroplasty.   PROCEDURE IN DETAIL:  After successful administration of general anesthetic,  a tourniquet is placed high on the right thigh and right lower extremity  prepped and draped in the usual sterile fashion.  Extremity is wrapped in  Esmarch, knee flexed, tourniquet inflated 300 mmHg.  Midline incision is  made with a 10 blade through subcutaneous tissue to the level of the  extensor mechanism.  Fresh blade is used make a medial parapatellar  arthrotomy.  Soft tissue over the proximal and medial tibia is  subperiosteally elevated to the joint line with a knife and into the  semimembranosus bursa with a Cobb  elevator.  Soft tissue laterally is  elevated with attention being paid to avoiding the patellar tendon on the  tibial tubercle.  We excised a tremendous amount of scar tissue from  underneath the quadriceps tendon and under the patella tendon.  When I open  the joint, there is minimal fluid, and we sent that for stat Gram stain  which was negative.  In addition, I sent some of the synovium for frozen  section which was negative and then some inflamed-appearing tissue from  around the tibia which was negative.  Once the scar is excised, then I  removed the tibial polyethylene, and there was some evidence of some wear  around the post.  There was some pitting in the polyethylene around the post  and also posterior and central.  There was no delamination of the  polyethylene.  I removed the polyethylene and then using an osteotome,  disrupted the interface between the femoral component and bone removed  without with minimal if any bone loss.  On the tibial side,  by disrupting  the interface anteriorly with an oscillating saw and just placing osteotome  underneath the component, and it was easily removed, consistent with the  component being loose.  We then removed the cement from both canals and  thoroughly irrigated both canals.  On the femoral side, I reamed up to 18 mm  with good press fit on the tibial side and went to 13 mm with 13 mm cemented  stem.   I placed the 13 mm reamer intramedullary on the tibial side and put the  intramedullary cutting guide.  Removed about 2 mm to 3 mm from the tibial  surface so as to get a fresh bony surface.  We then prepared proximally with  the proximal reamer and then used the keel punch.  I removed the tibial  tray.  On the femoral side, we had the 18 mm medullary reamer and placed the  5-degree right valgus alignment guide over that.  We removed about 2 mm of  bone off the medial side and had to go up to a 4 mm slot on the lateral side  and thus  used a 4 mm distal augment with the component.  I then placed AP  cutting block for a size 3 which was the most appropriate size.  With a 15  mm spacer block, we marked the rotation to create a rectangular flexion gap.  The stem was in the +2 position to effectively raise the stem and lower the  anterior flange of the component so it would rest on the anterior cortex of  the femur.  Posteriorly, we would have to use 4 mm augments medial and  lateral to contact bone.  After this the cuts are made, then we placed the  intercondylar revision block to make the intercondylar cut.  All the stem  reamers are removed then.   The tibial trial was placed which is a size 2.5 with a 13 x 60 stem  extension and on the femoral side, we had the size 3 posterior stabilized  femur with a 75 x 18 stem extension 4 mm distal lateral augment and 4 mm  medial and lateral posterior augments.  The trials have excellent fit.  We  went up to a 15 mm insert, and she had excellent stability in full extension  down through 120 degrees of flexion.  On the patellar side, there was  minimal if any wear of the patella.  There was a lot of overhanging  osteophyte which was all removed.  All the overhanging soft tissues removed.  We essentially did a patellaplasty to remove all that.  The patella then is  tracking normally when the knee is flexed.  I then let the tourniquet down  for a total time of 60 minutes.  We kept it down for 10 minutes while the  components were assembled on the back table.  After 10 minutes, we rewrapped  the leg in Esmarch and reinflated to 300 mmHg.  The cement restrictor size  is a 5, and we placed a size 5 to the appropriate depth in the tibial canal.  The bone is then copiously irrigated with pulsatile lavage and the cement  mixed.  Once ready for implantation, the tibial component is cemented both  in the tibial canal as well as on the cut bone surface.  On the femoral side, we just cemented  distally and had a Press-Fit stem.  Trial 15 insert  is placed and knee held in  full extension, all extruded cement removed.  When the cement is fully hardened, then the permanent 15 mm posterior  stabilized rotating platform insert is placed in the tibial tray.  The wound  is copiously irrigated with saline solution and the extensor mechanism  closed over a Hemovac drain with interrupted #1 PDS.  Flexion against  gravity is 125 degrees.  Tourniquet is released for the second tourniquet  time of 30 minutes.  Subcu os closed with interrupted 2-0 Vicryl and the  skin with staples.  The catheter for the Marcaine pain pump was placed, and  the pump is initiated.  The Hemovac is hooked to suction.  A bulky sterile  dressing is applied. and she is placed into a knee immobilizer, awakened,  and transported to recovery in stable condition.      Ollen Gross, M.D.  Electronically Signed     FA/MEDQ  D:  04/14/2005  T:  04/15/2005  Job:  [id number]

## 2010-08-19 NOTE — Consult Note (Signed)
NAMEKESSIE, CROSTON              ACCOUNT NO.:  [id number]   MEDICAL RECORD NO.:  [id number]          PATIENT TYPE:  INP   LOCATION:  1516                         FACILITY:  [facility]   PHYSICIAN:  Gaspar Garbe, M.D.DATE OF BIRTH:  06-Jul-1940   DATE OF CONSULTATION:  04/14/2005  DATE OF DISCHARGE:                                   CONSULTATION   REFERRED BY:  Ollen Gross, M.D.   HISTORY OF PRESENT ILLNESS:  The patient is a 70 year old white female with  a history of hyperlipidemia, hypertension who was admitted by Dr. Ollen Gross for purpose of a right knee replacement revision. Of note, the  patient had had a hypotensive episode that occurred following right knee  revision in September 2004. I was asked to follow patient medically to try  to ensure that this does not happen. The patient required pressors at the  prior time. The patient has been seen in the office by Dr. Waynard Edwards on January  8 and no adjustments were made in her medications. The patient is currently  being seen in the post anesthesia care unit and currently waking up. She has  had one episode of nausea and vomiting and her blood pressure per her  anesthesia record is shown to be normotensive. Blood pressure is up slightly  secondary to her nausea. She is unable to give a full history as she is not  quite fully aware at this point.   ALLERGIES:  SULFA, VIOXX.   MEDICATIONS:  1.  Flexeril 10 mg q.h.s.  2.  Prozac 20 mg p.o. daily.  3.  Diovan 80 mg daily.  4.  Aspirin 81 mg daily.  5.  Crestor 10 mg daily.  6.  Metoprolol 25 mg b.i.d.  7.  Valium 5 mg p.r.n.   PAST MEDICAL HISTORY:  1.  Hypertension.  2.  Hyperlipidemia.  3.  Apparent glucose intolerance. Last A1c done this week was 6.0 and was      treated with diet and exercise.  4.  Fibromyalgia.  5.  Osteopenia.  6.  Gastroesophageal reflux disease.   PAST SURGICAL HISTORY:  Cholecystectomy in 1992, incisional hernia in 1993,  arthroscopy of  her right knee in 1988, right knee replacement September of  2004, revision today as noted. Left first toe fusion earlier this year,  carpal tunnel surgery in early 2000.   SOCIAL HISTORY:  The patient lives in [place] with her husband. 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   FAMILY HISTORY:  Mother died age 79 of a CVA, father died age 82 of an MI,  she is an only child.   REVIEW OF SYSTEMS:  Not currently obtainable but nausea and vomiting is  currently noted.   ADVANCED DIRECTIVES:  The patient is a full code.   PHYSICAL EXAMINATION:  VITAL SIGNS:  Pulse 105, blood pressure 137/76, 92%  on mask.  GENERAL:  The patient is somewhat nauseated.  HEENT:  Normocephalic, atraumatic. PERRL. EOMI. ENT within normal limits.  NECK:  Supple, no lymphadenopathy, JVD or bruit.  HEART:  Tachycardic, no murmur appreciated.  LUNGS:  Clear to auscultation bilaterally.  ABDOMEN:  Bowel sounds normoactive.  EXTREMITIES:  The patient's status post right knee revision and has normal  movement and sensation in her right lower extremity.  NEUROLOGIC:  The patient is mildly obtunded, as noted above.   Chest x-ray currently pending postoperatively. Prior testing, the patient  had a negative Cardiolite by [facility] in January 2006.   LABS PRIOR TO ADMISSION:  White count 7.9, hemoglobin 13.4, hematocrit 39.9,  platelets 270, BUN and creatinine 22 and 0.9 respectively. Sodium is 146,  potassium 4.9. LFTs within normal limits.   ASSESSMENT/PLAN:  1.  Status post right knee revision, pain management and anticoagulation per      orthopedics and mobility per them as well.  2.  Hypertension. Will set parameters for holding her blood pressure      medicines. If her blood pressure is above 140 in the morning, she will      receive her morning medicine and if not these will be held. If her blood      pressure drops below 110, she has instructions to receive  500 mL of      normal saline as a bolus and if she maintains under this level to call      MD.  If persists, will pursue an adrenal workup.  3.  Hyperlipidemia.  Will continue her Crestor 10 mg a day.  4.  Depression. Continue her Prozac 20 mg a day.   Thank you for allowing me to consult on this patient. I will continue to  follow her while she is in house.      Gaspar Garbe, M.D.  Electronically Signed     RWT/MEDQ  D:  04/14/2005  T:  04/16/2005  Job:  [id number]   cc:   Ollen Gross, M.D.  Fax: [phone]   Mark A. Perini, M.D.  Fax: [phone]

## 2010-08-19 NOTE — Op Note (Signed)
[facility]  Patient:    [name], [name]                     MRN: [id number] Proc. Date: 05/10/99 Adm. Date:  [id number] Disc. Date: [id number] Attending:  Susa Day CC:         Rana Snare. Judie Grieve, M.D.             Katy Fitch Sypher, Montez Hageman., M.D. x 2                           Operative Report  PREOPERATIVE DIAGNOSIS:  Entrapment neuropathy, median nerve, right carpal tunnel.  POSTOPERATIVE DIAGNOSIS:  Entrapment neuropathy, median nerve, right carpal tunnel.  OPERATION:  Release of right transverse carpal ligament.  SURGEON:  Katy Fitch. Naaman Plummer., M.D.  ASSISTANT:  Rhoderick Moody, P.A.  ANESTHESIA:  General by LMA.  SUPERVISING ANESTHESIOLOGIST:  Cliffton Asters. Ivin Booty, M.D.  INDICATIONS:  The patient is a 70 year old woman who has had longstanding numbness in her hands, dating back to 21.  Clinical examination suggested significant bilateral carpal tunnel syndrome.  Electrodiagnostic studies confirmed severe right carpal tunnel syndrome and moderate left carpal tunnel syndrome.  After informed consent, she is brought to the operating room at this time for release of the right transverse carpal ligament.  DESCRIPTION OF PROCEDURE:  The patient was brought to the operating room and placed in the supine position on the operating table.  Following induction of general anesthesia by LMA, the right arm was prepped with Betadine soap and solution, and sterilely draped.  Following exsanguination of the limb with an Esmarch bandage, arterial tourniquet was inflated to 220 mmHg.  The procedure commenced with a short incision in the line of the ring finger in the palm.  Subcutaneous tissues were  carefully divided in the middle of the palmar fascia.  This was split longitudinally with the common extensor branch of the median nerve.  This was followed back to the transverse carpal ligament which was carefully isolated from the median  nerve.  The ligament was released on its ulnar border extending into the distal forearm.  This widely opened the carpal canal.  No masses or other predicaments were noted.  Bleeding points along the margin of the released transverse carpal ligament were electrocauterized with bipolar current followed by repair of the skin with intradermal 3-0 Prolene suture.  A compressive dressing applied, and a volar plaster splint with the hand and wrist in 5 degrees of dorsiflexion. DD:  05/10/99 TD:  05/10/99 Job: [id number]

## 2010-08-19 NOTE — H&P (Signed)
[name], [name]              ACCOUNT NO.:  [id number]   MEDICAL RECORD NO.:  [id number]          PATIENT TYPE:  INP   LOCATION:  NA                           FACILITY:  [facility]   PHYSICIAN:  Ollen Gross, M.D.    DATE OF BIRTH:  1940-10-08   DATE OF ADMISSION:  04/14/2005  DATE OF DISCHARGE:                                HISTORY & PHYSICAL   DATE OF OFFICE VISIT HISTORY AND PHYSICAL:  April 11, 2005.   CHIEF COMPLAINT:  Right knee pain.   HISTORY OF PRESENT ILLNESS:  The patient is a 70 year old female who has  been evaluated by Dr. Lequita Halt for ongoing right knee pain.  She had a total  knee done a little over 2 years ago by Dr. Thomasena Edis.  She never had a good  pain free interval following this surgery.  It has progressively gotten  worse over time.  She has undergone aspiration and fluid work-up for  infection, which has been negative.  She had a bone scan this past June of  2006 which did show some loosening.  It was of note that she had a bout of  hypotension following immediately after the surgery requiring intensive care  unit placement for several days.  She was unable to get started on her  therapy until several days after the surgery once she was feeling better.  She did state she developed some stiffness.  She has been seen by Dr. Charlann Boxer  for a second opinion, who recommended a revision, and she was followed back  up by Dr. Lequita Halt for consultation.  She unfortunately has had increased  pain recently.  There is some concern about infection.  It is felt that she  would possibly require full revision versus resection arthroplasty.  Multiple intraoperative cultures and frozen sections will be taken at the  time of the procedure, what has been recommended.  If there is any  indication of infection, Dr. Lequita Halt will proceed with a 2-stage procedure;  otherwise, may go ahead with the 1-stage full revision.  Risks and benefits  have been discussed, and the patient is  subsequently admitted to surgery.   ALLERGIES:  1.  SULFA causes a vaginal infection.  2.  PERCOCET causes hallucinations.  3.  Food allergy of SHRIMP.   INTOLERANCES:  1.  ETHRANE makes her nauseated and vomit.  2.  SERATOL makes her nauseated and vomit.   CURRENT MEDICATIONS:  1.  Flexeril 10 mg daily.  2.  Prozac 20 mg daily.  3.  Diovan 80 mg daily.  4.  Crestor 10 mg daily.  5.  Aspirin 81 mg daily; stopped prior to surgery.  6.  Metoprolol half tablet twice a day.   PAST MEDICAL HISTORY:  1.  Hypertension.  2.  Fibromyalgia.  3.  Osteoarthritis.   PAST SURGICAL HISTORY:  1.  Anterior lumbar fusion.  2.  Gallbladder surgery.  3.  Arthroscopic procedures x2.  4.  Shoulder surgery.  5.  Bilateral great toe surgeries.  6.  Great toe fusion.  7.  Right total knee replacement in September 2004.  8.  Wrist surgery.  9.  Umbilical hernia procedure.  10. Fifth toe surgery.  11. Right carpal tunnel release.   SOCIAL HISTORY:  Married.  Retired Runner, broadcasting/film/video.  Nonsmoker.  Social intake of  alcohol, just twice a month.  One child.  Her husband will be assisting with  care after surgery.  She lives in a 2-story home with 13 steps.   FAMILY HISTORY:  Father deceased at age 59 with heart disease.  Mother  deceased at age 14 with hypertension and stroke.   REVIEW OF SYSTEMS:  GENERAL:  No fevers, chills or night sweats.  NEUROLOGIC:  No seizures, syncope or paralysis.  RESPIRATORY:  No shortness  of breath, productive cough or hemoptysis.  CARDIOVASCULAR:  No chest pain,  angina or orthopnea.  GI:  No nausea, vomiting, diarrhea or constipation.  GU:  No dysuria, hematuria or discharge.  MUSCULOSKELETAL:  Right knee as  found in the history of present illness.   PHYSICAL EXAMINATION:  VITAL SIGNS:  Pulse 88, respirations 12, blood  pressure 146/78.  GENERAL:  A 70 year old white female, well-nourished, well-developed, in no  acute distress, alert and oriented and cooperative.   Very pleasant at the  time of my examination.  She is accompanied by her husband.  She is an  excellent historian.  She is mildly anxious.  HEENT:  Normocephalic and atraumatic.  Pupils equal, round and reactive.  Noted to wear glasses.  Extraocular movements are intact.  NECK:  Supple.  No carotid bruits.  CHEST:  Clear anterior and posterior chest walls.  No rhonchi, rales or  wheezing.  HEART:  She does have a regular irregular rhythm with an S1, S2 noted with  an added ectopic possible PVC beat.  ABDOMEN:  Soft, slightly round, nontender.  Bowel sounds present.  RECTAL/BREASTS/GENITALIA:  Not done.  Not pertinent to present illness.  EXTREMITIES:  Right knee with some slight swelling.  Small intra-articular  effusion.  Range of motion 5 to 90 degrees.  Global tenderness.  There were  2-3 mm of play, about 70 degrees of flexion, and minimal extension.   IMPRESSION:  1.  Right knee pain with loose right total knee arthroplasty.  2.  Hypertension.  3.  Hyperthyroidism.  4.  Fibromyalgia.  5.  Postoperative hypotensive episode requiring ICU in September 2004      (previous right total hip arthroplasty).   PLAN:  The patient admitted to [facility] to undergo a revision  right total knee arthroplasty versus a resection arthroplasty, total knee.  The final surgical plan will be determined intraoperatively with multiple  intraoperative cultures and frozen sections.  The surgery will be performed  by Dr. Ollen Gross.  Her medical doctor is Dr. Waynard Edwards, and her  cardiologist is Dr. Deborah Chalk.  Both will be notified of the room number on  admission and be consulted to assist with medical management of the patient  postoperatively.      Alexzandrew L. Julien Girt, P.A.      Ollen Gross, M.D.  Electronically Signed    ALP/MEDQ  D:  04/13/2005  T:  04/14/2005  Job:  [id number]   cc:   Loraine Leriche A. Perini, M.D. Fax: [phone]   Colleen Can. Deborah Chalk, M.D.  Fax: [phone]

## 2010-08-19 NOTE — H&P (Signed)
NAME:  [name], [name]                        ACCOUNT NO.:  [id number]   MEDICAL RECORD NO.:  [id number]                   PATIENT TYPE:  INP   LOCATION:  NA                                   FACILITY:  [facility]   PHYSICIAN:  R. Valma Cava, M.D.             DATE OF BIRTH:  [birth year]   DATE OF ADMISSION:  12/19/2002  DATE OF DISCHARGE:                                HISTORY & PHYSICAL   CHIEF COMPLAINT:  Right knee osteoarthritis.   HISTORY OF PRESENT ILLNESS:  This is a 70 year old female with a long  history of osteoarthritis of the right knee.  She has been treated  conservatively with steroid injections, Synvisc injections, physical  therapy, but is still having breakthrough pain.  Her x-rays show end-stage  osteoarthritis.  After discussion of treatment options, risks and benefits,  she is now scheduled for a total knee replacement of her right knee.  Questions are invited and answered, and she wishes to proceed with surgery,  and surgery is to go ahead as scheduled.  She has received medical clearance  from Dr. Waynard Edwards her medical doctor, and her cardiologist is Dr. Deborah Chalk.   ALLERGIES:  1. PERCOCET which causes hallucinations.  2. SHELLFISH.  3. ETHRANE.  4. __________.   CURRENT MEDICATIONS:  1. Prozac 20 mg daily.  2. Crestor 10 mg daily.  3. Diovan 80 mg daily.  4. Ziac 2.5/6.25 p.o. daily.   PAST SURGICAL HISTORY:  1. Anterior lumbar fusion.  2. Right knee arthroscopy.  3. Right shoulder scope.  4. Cholecystectomy.  5. Right great toe and left great toe surgery.  6. Left wrist surgery.   PAST MEDICAL HISTORY:  1. Hypertension.  2. 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   REVIEW OF SYSTEMS:  CENTRAL NERVOUS SYSTEM:  Negative for headache, blurry  vision, dizziness.  PULMONARY:  Negative for shortness of breath, PND, or  orthopnea.  CARDIOVASCULAR:  Positive for a regularly irregular heartbeat  and hypertension.   GASTROINTESTINAL:  Negative for ulcers or hepatitis.  GENITOURINARY:  Negative for urinary tract difficulty.  MUSCULOSKELETAL:  Positive in HPI.   SOCIAL HISTORY:  The patient is married, she is retired, she does not smoke  or drink.   FAMILY HISTORY:  Positive for coronary artery disease, hypertension, and  CVA.   PHYSICAL EXAMINATION:  VITAL SIGNS:  BP 140/78, respirations 18, pulse 76  regularly irregular.  GENERAL:  This is a well-developed, well-nourished lady in no acute  distress.  HEENT:  Head is normocephalic.  Nose patent.  Ears patent.  Pupils equal,  round, reactive to light.  Throat without injection.  NECK:  Supple without adenopathy.  Carotids 2+ with a 1-/6 bruit in the  right carotid that the patient has known about for quite some time, and is  being followed conservatively.  CHEST:  Clear to auscultation.  No rales or rhonchi.  Respirations 18.  HEART:  Regularly irregular beat at 76 bpm without murmur.  ABDOMEN:  Soft with active bowel sounds, no masses or organomegaly.  NEUROLOGICAL:  The patient is alert and oriented to time, place, and person.  Cranial nerves II-XII grossly intact.  EXTREMITIES:  Right knee with a 5 degree flexion contracture and further  flexion to 125 degrees.  She has trace effusion.  Dorsalis pedis and  posterior tibialis pulses are 2+.  Sensation and circulation are grossly  intact.   DIAGNOSTIC STUDIES:  X-rays show osteoarthritis of the right knee.   IMPRESSION:  End-stage osteoarthritis of the right knee.   PLAN:  Total knee arthroplasty of the right knee.     Thyra Breed, P.A.-C                Benny Lennert, M.D.    SJC/MEDQ  D:  12/10/2002  T:  12/10/2002  Job:  [id number]

## 2010-08-19 NOTE — Discharge Summary (Signed)
NAME:  [name], [name]                        ACCOUNT NO.:  [id number]   MEDICAL RECORD NO.:  [id number]                   PATIENT TYPE:  INP   LOCATION:  5013                                 FACILITY:  [facility]   PHYSICIAN:  Erasmo Leventhal, M.D.         DATE OF BIRTH:  [birth year]   DATE OF ADMISSION:  12/19/2002  DATE OF DISCHARGE:  12/25/2002                                 DISCHARGE SUMMARY   ADMISSION DIAGNOSIS:  End-stage osteoarthritis, right knee.   DISCHARGE DIAGNOSIS:  End-stage osteoarthritis, right knee.   OPERATION:  Total knee arthroplasty, right knee.   BRIEF HISTORY:  This 70 year old lady with history of osteoarthritis of the  right knee with failure of conservative treatment of injections of Synvisc  and BP.  After discussion of treatment options, the patient is now scheduled  for total knee arthroplasty of the right knee.  Surgery risk and benefits  and alternatives were discussed in detail with the patient.  Surgery will go  ahead as scheduled.   LABORATORY DATA:  CBC  within normal limits.  PT and PTT  within normal  limits.  CMET  within normal limits with the exception of elevated glucose  at 109.   HOSPITAL COURSE:  The patient tolerated the operative procedure well.  Due  to multiple medical problems, the patient had a medical consult obtained for  followup through hospitalization.  On the first postoperative day, the  patient was stable.  Her pain was moderate.  Blood pressure was slightly low  and was being managed by the medicine service.  She was started on CPM and  physical therapy, and continued to monitor blood pressure.   On the second postoperative day, the patient was alert and oriented. She was  using PCA for pain control.  She was doing CPM and bed-to-chair transfer.  She had some pain in her left upper arm and states that her muscle was sore  then trying to use it. She had no shoulder pain or elbow pain and no neck  pain.  No previous  neck or shoulder problems.  She was tender over biceps  tendon at the muscle belly.  No ecchymosis or edema.  Good strength in  biceps and triceps.  Good grip strength.  No numbness or tingling.  Vital  signs were stable and temperature to a maximum of 99.0.  Drain was removed.  Calves were nontender.  Negative Homan's sign was noted.  Hemoglobin and  hematocrit were stable at 10.4 and 29.8, and an x-ray of the arm was ordered  to rule out any bony abnormality.  Plans were made to discontinue Foley and  PCA in the a.m.  She was transferred from the monitored unit to 5000.   On the third postoperative day, she was feeling better.  Her knee was  healing pretty good.  Her left arm had decreased pain and increased range of  motion of the shoulder.  Her  vital signs were stable.  She was afebrile.  Hemoglobin and hematocrit were s table at 10.7 and 30.4.  BMET  within  normal limits with exception of glucose at 165.  PT was 15.7, INR 1.4.  Lungs were clear.  Bowel sounds were sluggish.  Heart sounds were normal.  Calves were nontender.  Dressing was changed.  There was slight redness  proximally.  Otherwise, no evidence of acute infection.   On the fourth postoperative day, the patient was feeling a lot better.  Vital signs were stable.  She was afebrile.  Dressing was changed.  Wound  was benign.  No calf tenderness.  Shoulder was feeling better.  The patient  was up with physical therapy without difficulty, and discharge planning was  made for Thursday.   On September 22, the patient was feeling better, still somewhat sore in her  shoulder.  Vital signs were stable.  She was afebrile.  PT 18.8 with INR  1.9.  Her lungs were clear.  Blood pressure 118/68 and stable.  Dressings  were changed.  The wounds were benign.  There was no calf tenderness.  Due  to her continued mild discomfort in her shoulder, she was injected in her  shoulder with a Xylocaine and Kenalog mixture without difficulty.   She will  continue physical therapy and occupational therapy.   On September 23, feeling good with shoulder pain ablated with the injection.  Vital signs stable.  Wounds clear.  No calf tenderness.  Ambulating well in  therapy with full shoulder range of motion.  The patient was subsequently  discharged home for followup in the office.   CONDITION ON DISCHARGE:  Improved.   DISCHARGE MEDICATIONS:  1. Robaxin 500 mg 1 p.o. q.8h. p.r.n. spasm.  2. Coumadin per pharmacy protocol.  3. Vicodin 1 q.4-6h. p.r.n. pain.   FOLLOW UP:  In the office in two weeks.   DISCHARGE INSTRUCTIONS:  She is to do the home physical therapy and call the  office if any problems or questions arise.      Jaquelyn Bitter. Chabon, P.A.                   Erasmo Leventhal, M.D.    SJC/MEDQ  D:  03/26/2003  T:  03/27/2003  Job:  [id number]

## 2010-08-19 NOTE — Discharge Summary (Signed)
NAMEMARCUS, [name]              ACCOUNT NO.:  [id number]   MEDICAL RECORD NO.:  [id number]          PATIENT TYPE:  INP   LOCATION:  1603                         FACILITY:  [facility]   PHYSICIAN:  Ollen Gross, M.D.    DATE OF BIRTH:  [birth year]   DATE OF ADMISSION:  10/21/2008  DATE OF DISCHARGE:  10/26/2008                               DISCHARGE SUMMARY   ADMITTING DIAGNOSES:  1. Osteoarthritis of the left knee.  2. Anxiety.  3. Hypertension.  4. Hyperlipidemia.  5. History of glucose intolerance.  6. Fibromyalgia.  7. Osteopenia.  8. Gastroesophageal reflux disease.  9. History of irregular heart rate (atrial bigeminy).  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   DISCHARGE DIAGNOSES:  1. Osteoarthritis left knee, status post left total knee replacement      arthroplasty.  2. Postoperative hypotension, improved.  3. Postoperative bradycardia, transient.  4. Anxiety.  5. Hypertension.  6. Hyperlipidemia.  7. History of glucose intolerance.  8. Fibromyalgia.  9. Osteopenia.  10.Gastroesophageal reflux disease.  11.History of irregular heart rate (atrial bigeminy).  12.Pituitary microadenoma.   PROCEDURE:  October 21, 2008, left total knee arthroplasty.  Surgeon:  Dr.  Lequita Halt.  Assistant:  Alexzandrew L. Perkins, P.A.C. Anesthesia was  spinal (no Duramorph).  Tourniquet time:  26 minutes.   CONSULTANTS:  Cardiology, Dr. Amil Amen covering for Dr. Deborah Chalk.   BRIEF HISTORY:  [name] is a 70 year old female with end-stage  arthritis of the left knee, progressive worsening pain and dysfunction.  Failed nonoperative management, including injections, and now presented  for total knee arthroplasty.   LABORATORY DATA:  Preoperative CBC showed hemoglobin 13.5, hematocrit of  39.3, white cell count 7.6, platelets 260.  Postop hemoglobin 11.1, then  10.3 came back up, last noted H and H 10.6 and 30.2.  PT/PTT preop 13  and 26, respectively.  INR 1.  Serial protimes followed per Coumadin  protocol.  Last noted PT/INR 22.4 and 1.9.  Chem panel on admission:  Slightly elevated glucose 149.  Remaining chem panel within normal  limits.  Serial BMETs followed.  Electrolytes remained within normal  limits.  Did have two sets cardiac enzymes on October 21, 2008 and October 22, 2008.  First set:  CK 91, CK-MB 1.7, troponin 0.02.  Second set:  CK  normal at 121, CK-MB normal at 2, relative index normal at 1, troponin  normal at 0.01.  Preop UA did show a large leukocyte esterase, few  epithelials, 11-20 white cells, few bacteria.  Follow-up UA was negative  on October 23, 2008, and no growth on the urine.  Blood group type B  positive.   EKG:  EKG date October 22, 2008:  Sinus rhythm, marked __________, with  premature ventricular complexes, nonspecific T-wave abnormality.  No  significant change since last tracing.  Confirmed by Dr. Nicki Guadalajara.   CHEST X-RAY:  Chest x-ray on October 15, 2008.  No acute cardiopulmonary  disease.   HOSPITAL COURSE:  The patient was admitted to [facility] and  taken to O.R. and underwent the above-stated procedure without  complication.  The patient tolerated the  procedure well and later  transferred to the recovery room and then to the orthopedic floor.  Later that evening, around 6:15, I received a call concerning the  patient's pulse rate and pressure.  Pressure had dropped quite low into  the 80s and 90s, and her pulse rate had dropped, transiently dropped  down into the 30s for a short period time.  The patient had a history of  irregular heart rate, atrial bigeminy.  She had also had a pituitary  adenoma, and her endocrinologist had recommend doing a steroid stress  dose.  She did receive Solu-Cortef perioperatively and was put on a  prednisone taper.  The patient's son was in the room and was there  during the episode.  She was interviewed and remained alert and  oriented.  She denied any syncopal or presyncopal episodes.  No nausea  or  vomiting.  No chest pain or shortness of breath.  When she was  evaluated, the pulse was already back up in the 70s.  It appeared to be  a transient episode, but due to her history with hypertension,  hyperlipidemia, and several cardiac risk factors, we got a cardiology  consult.  The patient was seen by Dr. Amil Amen later that evening,  covering for Dr. Deborah Chalk.  It was felt that more than likely this brief  episode was possibly a vagal episode.  Continued with the fluids, and  transferred the patient to telemetry.  Did do cardiac enzymes and two  sets of cardiac markers which were negative.  Did recommend if she  developed any significant hypoxia would consider CT.  She did pretty  well through the night.  By the morning of day #1, the patient's  pressure had remained stable, and her pulse was stable.  She did have a  history of PACs, and she was seen by Dr. Deborah Chalk the following morning,  felt to be probably a vagal episode postoperatively, and was resulting  in the bradycardia and hypotension.  She was doing well, and her  pressure was stabilized.  On the follow-up EKG the next morning, on October 22, 2008, the rate had improved.  She was moved back up to sixth floor.  She did have a preop urinary tract infection, which was treated  preoperatively.  We did have a recheck of the urine, and it was clear,  with no growth on the urine culture.  She was initially slow to progress  with physical therapy, only pivoting and taking a few steps the first 2  days.  By day #2, the dressing had been changed, and the incision was  looking well.  She was feeling a little bit better by day #2 and  continued PT.  We were going to arrange for home CPM at patient request,  felt she would require a couple days more of therapy, since she was just  starting out.  By day #3 and day #4, she started moving a little bit  better, walking about 60 and 70 feet.  Did not have any further episodes  with the  bradycardia.  She continued to progress well.  She did remain  on her steroid taper while she was there.  Each day, she showed  improvement up until October 26, 2008, when she was walking over 100 feet.  Pain was under better control.  She had been weaned off the PCA  initially and over to p.o. medications and tolerating medications.  By  October 26, 2008, she was  meeting her goals and then discharged home.   DISCHARGE PLAN:  The patient was discharged home on October 26, 2008.   DISCHARGE DIAGNOSES:  Please see above.   DISCHARGE MEDICATIONS:  1. Norco.  2. Robaxin.  3. Colace.  4. MiraLax .  5.   DIET:  Heart-healthy diet.   FOLLOWUP:  In 2 weeks.   ACTIVITY:  Total knee protocol.  She is weightbearing as tolerated, with  home health PT and home health nursing.   DISPOSITION:  Home.   CONDITION UPON DISCHARGE:  Improving.      Alexzandrew L. Perkins, P.A.C.      Ollen Gross, M.D.  Electronically Signed    ALP/MEDQ  D:  11/26/2008  T:  11/26/2008  Job:  [id number]   cc:   Colleen Can. Deborah Chalk, M.D.  Fax: [phone]   Dorisann Frames, M.D.  Fax: [phone]   Redge Gainer. Perini, M.D.  Fax: [phone]   Francisca December, M.D.  Fax: [phone]

## 2010-08-19 NOTE — H&P (Signed)
NAMECOTINA, FREEDMAN              ACCOUNT NO.:  [id number]   MEDICAL RECORD NO.:  [id number]          PATIENT TYPE:  INP   LOCATION:  NA                           FACILITY:  [facility]   PHYSICIAN:  Ollen Gross, M.D.    DATE OF BIRTH:  Oct 29, 1940   DATE OF ADMISSION:  10/21/2008  DATE OF DISCHARGE:                              HISTORY & PHYSICAL   CHIEF COMPLAINT:  Left knee pain.   HISTORY OF PRESENT ILLNESS:  The patient is a 70 year old female who has  seen by Dr. Lequita Halt for ongoing left knee pain.  She has known end-stage  arthritis.  She had been treated conservatively in the past including  injections, recently undergone Synvisc back in February, unfortunately,  did not get any substantial benefit.  She is at a point where she would  like to have something done.  Risks and benefits have been discussed.  She elected to proceed with surgery.  The patient states she has been  seen by Dr. Talmage Nap recently, and Dr. Talmage Nap recommended low-dose  hydrocortisone at the beginning and at the end of surgery.  Risks and  benefits have been discussed, she elects to proceed with surgery.   ALLERGIES:  SULFA.   CURRENT MEDICATIONS:  1. Aspirin 81 mg.  2. Flexeril 10 mg.  3. Diovan 80 mg.  4. Metacarpal 50 mg.  5. Prozac 20 mg.  6. Fosamax 70 mg.  7. Zocor 40 mg.  8. Prilosec.  9. Vitamin D.   PAST MEDICAL HISTORY:  1. Anxiety.  2. Hypertension.  3. Hyperlipidemia.  4. History of glucose intolerance.  5. Fibromyalgia.  6. Osteopenia.  7. Gastroesophageal reflux disease.  8. History of an irregular heart rate.   PAST SURGICAL HISTORY:  1. Anterior lumbar fusion.  2. Right total knee replacement.  3. Revision right total knee replacement.  4. Gallbladder surgery.  5. Carpal tunnel surgery on the left.  6. Left wrist surgery.  7. Right shoulder surgery.  8. Right and left great toe surgery.   FAMILY HISTORY:  Father deceased age 45 with heart attack.  Mother  deceased age 33  with history of stroke.   SOCIAL HISTORY:  Widowed, retired Runner, broadcasting/film/video.  Nonsmoker.  No alcohol.  Lives alone.  She does have a caregiver lined up.  She does have a  living will healthcare power of attorney.   REVIEW OF SYSTEMS:  GENERAL:  No fevers, chills or night sweats.  NEUROLOGICAL:  No seizures, syncope or paralysis.  RESPIRATORY:  No  shortness of breath, productive cough or hemoptysis.  CARDIOVASCULAR:  No chest pain or orthopnea.  GI:  No nausea, vomiting, diarrhea or  constipation.  GU:  No dysuria or hematuria.  MUSCULOSKELETAL:  Joint  pain.   PHYSICAL EXAMINATION:  VITAL SIGNS:  Pulse 64, respirations 12, blood  pressure 112/60.  GENERAL:  A 70 year old white female, well-nourished, well-developed,  short-statured, overweight, no acute distress.  She is alert and  cooperative, pleasant.  HEENT: Normocephalic, atraumatic.  Pupils are reactive.  Oropharynx  clear.  EOMs intact.  NECK:  Supple.  CHEST:  Clear.  HEART:  Regular rate and rhythm.  No murmur, S1 ST noted.  ABDOMEN:  Soft, nontender.  Bowel sounds present.  RECTAL/BREASTS/GENITALIA:  Not done, not pertinent to present illness.  EXTREMITIES:  Left knee moderate effusion, tender more medial.  No  lateral pain.  No instability.   IMPRESSION:  Osteoarthritis left knee.   PLAN:  The patient admitted to [facility] to undergo a left  total knee replacement arthroplasty.  Surgery will be performed by Ollen Gross.      Alexzandrew L. Perkins, P.A.C.      Ollen Gross, M.D.  Electronically Signed    ALP/MEDQ  D:  10/20/2008  T:  10/21/2008  Job:  [id number]   cc:   Loraine Leriche A. Perini, M.D.  Fax: [phone]   Dorisann Frames, M.D.  Fax: [phone]   Colleen Can. Deborah Chalk, M.D.  Fax: [phone]

## 2010-09-13 ENCOUNTER — Emergency Department (HOSPITAL_COMMUNITY)
Admission: EM | Admit: 2010-09-13 | Discharge: 2010-09-13 | Disposition: A | Discharge status: Home / Self Care | Payer: Medicare Other | Attending: Emergency Medicine | Admitting: Emergency Medicine

## 2010-09-13 ENCOUNTER — Emergency Department (HOSPITAL_COMMUNITY): Payer: Medicare Other

## 2010-09-13 DIAGNOSIS — Z7982 Long term (current) use of aspirin: Secondary | ICD-10-CM | POA: Insufficient documentation

## 2010-09-13 DIAGNOSIS — M1990 Unspecified osteoarthritis, unspecified site: Secondary | ICD-10-CM | POA: Insufficient documentation

## 2010-09-13 DIAGNOSIS — R911 Solitary pulmonary nodule: Secondary | ICD-10-CM | POA: Insufficient documentation

## 2010-09-13 DIAGNOSIS — E2749 Other adrenocortical insufficiency: Secondary | ICD-10-CM | POA: Insufficient documentation

## 2010-09-13 DIAGNOSIS — K5732 Diverticulitis of large intestine without perforation or abscess without bleeding: Secondary | ICD-10-CM | POA: Insufficient documentation

## 2010-09-13 DIAGNOSIS — Z79899 Other long term (current) drug therapy: Secondary | ICD-10-CM | POA: Insufficient documentation

## 2010-09-13 DIAGNOSIS — I10 Essential (primary) hypertension: Secondary | ICD-10-CM | POA: Insufficient documentation

## 2010-09-13 LAB — URINALYSIS, ROUTINE W REFLEX MICROSCOPIC
Bilirubin Urine: NEGATIVE
Glucose, UA: NEGATIVE mg/dL
Hgb urine dipstick: NEGATIVE
Ketones, ur: NEGATIVE mg/dL
Nitrite: NEGATIVE
pH: 5.5 (ref 5.0–8.0)

## 2010-09-13 LAB — CBC
MCH: 30 pg (ref 26.0–34.0)
MCV: 89.4 fL (ref 78.0–100.0)
Platelets: 251 10*3/uL (ref 150–400)
RBC: 4.24 MIL/uL (ref 3.87–5.11)

## 2010-09-13 LAB — BASIC METABOLIC PANEL
BUN: 18 mg/dL (ref 6–23)
Chloride: 100 mEq/L (ref 96–112)
Creatinine, Ser: 0.62 mg/dL (ref 0.4–1.2)
GFR calc Af Amer: >60 mL/min (ref >60)
Glucose, Bld: 103 mg/dL — ABNORMAL HIGH (ref 70–99)

## 2010-09-13 LAB — DIFFERENTIAL
Basophils Relative: 0 % (ref 0–1)
Eosinophils Absolute: 0 10*3/uL (ref 0.0–0.7)
Lymphocytes Relative: 19 % (ref 12–46)
Monocytes Relative: 13 % — ABNORMAL HIGH (ref 3–12)
Neutro Abs: 9.8 10*3/uL — ABNORMAL HIGH (ref 1.7–7.7)
Neutrophils Relative %: 68 % (ref 43–77)

## 2010-09-13 LAB — URINE MICROSCOPIC-ADD ON

## 2010-09-13 MED ORDER — IOHEXOL 300 MG/ML  SOLN
80.0000 mL | Freq: Once | INTRAMUSCULAR | Status: AC | PRN
  Administered 2010-09-13: 80 mL via INTRAVENOUS

## 2010-09-15 LAB — URINE CULTURE
Culture  Setup Time: 201206130142
Special Requests: 10722667

## 2010-09-16 ENCOUNTER — Other Ambulatory Visit: Payer: Self-pay | Admitting: Internal Medicine

## 2010-09-16 DIAGNOSIS — IMO0002 Reserved for concepts with insufficient information to code with codable children: Secondary | ICD-10-CM

## 2010-09-21 ENCOUNTER — Ambulatory Visit
Admission: RE | Admit: 2010-09-21 | Discharge: 2010-09-21 | Disposition: A | Discharge status: Home / Self Care | Payer: Medicare Other | Source: Ambulatory Visit | Attending: Internal Medicine | Admitting: Internal Medicine

## 2010-09-21 DIAGNOSIS — IMO0002 Reserved for concepts with insufficient information to code with codable children: Secondary | ICD-10-CM

## 2010-09-21 MED ORDER — GADOBENATE DIMEGLUMINE 529 MG/ML IV SOLN
18.0000 mL | Freq: Once | INTRAVENOUS | Status: AC | PRN
  Administered 2010-09-21: 18 mL via INTRAVENOUS

## 2011-02-17 ENCOUNTER — Telehealth: Payer: Self-pay

## 2011-02-17 NOTE — Telephone Encounter (Signed)
Pt. Stated PCP refills her medications

## 2011-04-04 HISTORY — PX: CARDIAC CATHETERIZATION: SHX172

## 2011-06-21 ENCOUNTER — Other Ambulatory Visit: Payer: Self-pay | Admitting: Interventional Cardiology

## 2011-06-23 ENCOUNTER — Inpatient Hospital Stay (HOSPITAL_COMMUNITY)
Admission: AD | Admit: 2011-06-23 | Discharge: 2011-06-30 | DRG: 234 | Disposition: A | Discharge status: Home / Self Care | Payer: Medicare Other | Source: Ambulatory Visit | Attending: Thoracic Surgery (Cardiothoracic Vascular Surgery) | Admitting: Thoracic Surgery (Cardiothoracic Vascular Surgery)

## 2011-06-23 ENCOUNTER — Encounter (HOSPITAL_COMMUNITY): Payer: Self-pay | Admitting: *Deleted

## 2011-06-23 ENCOUNTER — Inpatient Hospital Stay (HOSPITAL_BASED_OUTPATIENT_CLINIC_OR_DEPARTMENT_OTHER)
Admission: RE | Admit: 2011-06-23 | Discharge: 2011-06-23 | Disposition: A | Discharge status: Hospice | Payer: Medicare Other | Source: Ambulatory Visit | Attending: Interventional Cardiology | Admitting: Interventional Cardiology

## 2011-06-23 ENCOUNTER — Other Ambulatory Visit: Payer: Self-pay

## 2011-06-23 ENCOUNTER — Inpatient Hospital Stay (HOSPITAL_COMMUNITY): Payer: Medicare Other

## 2011-06-23 ENCOUNTER — Encounter (HOSPITAL_BASED_OUTPATIENT_CLINIC_OR_DEPARTMENT_OTHER)
Admission: RE | Disposition: A | Discharge status: Hospice | Payer: Self-pay | Source: Ambulatory Visit | Attending: Interventional Cardiology

## 2011-06-23 DIAGNOSIS — R911 Solitary pulmonary nodule: Secondary | ICD-10-CM | POA: Diagnosis present

## 2011-06-23 DIAGNOSIS — E2749 Other adrenocortical insufficiency: Secondary | ICD-10-CM | POA: Insufficient documentation

## 2011-06-23 DIAGNOSIS — D72829 Elevated white blood cell count, unspecified: Secondary | ICD-10-CM | POA: Diagnosis present

## 2011-06-23 DIAGNOSIS — E8779 Other fluid overload: Secondary | ICD-10-CM | POA: Diagnosis not present

## 2011-06-23 DIAGNOSIS — F3289 Other specified depressive episodes: Secondary | ICD-10-CM | POA: Diagnosis present

## 2011-06-23 DIAGNOSIS — F329 Major depressive disorder, single episode, unspecified: Secondary | ICD-10-CM | POA: Diagnosis present

## 2011-06-23 DIAGNOSIS — Z951 Presence of aortocoronary bypass graft: Secondary | ICD-10-CM

## 2011-06-23 DIAGNOSIS — E785 Hyperlipidemia, unspecified: Secondary | ICD-10-CM | POA: Diagnosis present

## 2011-06-23 DIAGNOSIS — E669 Obesity, unspecified: Secondary | ICD-10-CM | POA: Insufficient documentation

## 2011-06-23 DIAGNOSIS — T380X5A Adverse effect of glucocorticoids and synthetic analogues, initial encounter: Secondary | ICD-10-CM | POA: Diagnosis present

## 2011-06-23 DIAGNOSIS — I498 Other specified cardiac arrhythmias: Secondary | ICD-10-CM | POA: Diagnosis not present

## 2011-06-23 DIAGNOSIS — Z01812 Encounter for preprocedural laboratory examination: Secondary | ICD-10-CM

## 2011-06-23 DIAGNOSIS — I2510 Atherosclerotic heart disease of native coronary artery without angina pectoris: Secondary | ICD-10-CM

## 2011-06-23 DIAGNOSIS — I10 Essential (primary) hypertension: Secondary | ICD-10-CM | POA: Diagnosis present

## 2011-06-23 DIAGNOSIS — E039 Hypothyroidism, unspecified: Secondary | ICD-10-CM | POA: Diagnosis present

## 2011-06-23 DIAGNOSIS — F411 Generalized anxiety disorder: Secondary | ICD-10-CM | POA: Diagnosis present

## 2011-06-23 DIAGNOSIS — I519 Heart disease, unspecified: Secondary | ICD-10-CM | POA: Diagnosis not present

## 2011-06-23 DIAGNOSIS — Z880 Allergy status to penicillin: Secondary | ICD-10-CM

## 2011-06-23 DIAGNOSIS — D62 Acute posthemorrhagic anemia: Secondary | ICD-10-CM | POA: Diagnosis not present

## 2011-06-23 DIAGNOSIS — K219 Gastro-esophageal reflux disease without esophagitis: Secondary | ICD-10-CM | POA: Diagnosis present

## 2011-06-23 DIAGNOSIS — Z886 Allergy status to analgesic agent status: Secondary | ICD-10-CM

## 2011-06-23 DIAGNOSIS — Z7982 Long term (current) use of aspirin: Secondary | ICD-10-CM

## 2011-06-23 HISTORY — DX: Unspecified osteoarthritis, unspecified site: M19.90

## 2011-06-23 HISTORY — DX: Essential (primary) hypertension: I10

## 2011-06-23 HISTORY — DX: Depression, unspecified: F32.A

## 2011-06-23 HISTORY — DX: Atherosclerotic heart disease of native coronary artery without angina pectoris: I25.10

## 2011-06-23 HISTORY — DX: Other complications of anesthesia, initial encounter: T88.59XA

## 2011-06-23 HISTORY — DX: Acute upper respiratory infection, unspecified: J06.9

## 2011-06-23 HISTORY — DX: Major depressive disorder, single episode, unspecified: F32.9

## 2011-06-23 HISTORY — DX: Hypothyroidism, unspecified: E03.9

## 2011-06-23 HISTORY — DX: Gastro-esophageal reflux disease without esophagitis: K21.9

## 2011-06-23 HISTORY — DX: Anxiety disorder, unspecified: F41.9

## 2011-06-23 HISTORY — DX: Adverse effect of unspecified anesthetic, initial encounter: T41.45XA

## 2011-06-23 SURGERY — JV LEFT HEART CATHETERIZATION WITH CORONARY ANGIOGRAM
Anesthesia: Moderate Sedation

## 2011-06-23 MED ORDER — SODIUM CHLORIDE 0.9 % IJ SOLN: 3.0000 mL | Freq: Two times a day (BID) | INTRAMUSCULAR | Status: DC

## 2011-06-23 MED ORDER — SODIUM CHLORIDE 0.9 % IV SOLN
INTRAVENOUS | Status: DC
  Administered 2011-06-23 – 2011-06-24 (×4): via INTRAVENOUS

## 2011-06-23 MED ORDER — OXYCODONE-ACETAMINOPHEN 5-325 MG PO TABS: 1.0000 | ORAL_TABLET | ORAL | Status: DC | PRN

## 2011-06-23 MED ORDER — SODIUM CHLORIDE 0.9 % IJ SOLN: 3.0000 mL | INTRAMUSCULAR | Status: DC | PRN

## 2011-06-23 MED ORDER — EZETIMIBE 10 MG PO TABS
10.0000 mg | ORAL_TABLET | ORAL | Status: DC
  Filled 2011-06-23: qty 1

## 2011-06-23 MED ORDER — HEPARIN (PORCINE) IN NACL 100-0.45 UNIT/ML-% IJ SOLN
1200.0000 [IU]/h | INTRAMUSCULAR | Status: DC
  Administered 2011-06-23: 1050 [IU]/h via INTRAVENOUS
  Administered 2011-06-24 – 2011-06-25 (×2): 1200 [IU]/h via INTRAVENOUS
  Filled 2011-06-23 (×4): qty 250

## 2011-06-23 MED ORDER — HYDROCORTISONE 10 MG PO TABS
10.0000 mg | ORAL_TABLET | Freq: Every day | ORAL | Status: DC
  Administered 2011-06-24: 10 mg via ORAL
  Filled 2011-06-23: qty 1

## 2011-06-23 MED ORDER — FLUOXETINE HCL 20 MG PO CAPS
20.0000 mg | ORAL_CAPSULE | Freq: Every morning | ORAL | Status: DC
  Administered 2011-06-24 – 2011-06-25 (×2): 20 mg via ORAL
  Filled 2011-06-23 (×3): qty 1

## 2011-06-23 MED ORDER — NITROGLYCERIN IN D5W 200-5 MCG/ML-% IV SOLN
6.0000 ug/min | INTRAVENOUS | Status: DC
  Administered 2011-06-23: 3 ug/min via INTRAVENOUS
  Filled 2011-06-23: qty 250

## 2011-06-23 MED ORDER — ACETAMINOPHEN 325 MG PO TABS: 650.0000 mg | ORAL_TABLET | ORAL | Status: DC | PRN

## 2011-06-23 MED ORDER — ALPRAZOLAM 0.25 MG PO TABS
0.2500 mg | ORAL_TABLET | ORAL | Status: DC | PRN
  Administered 2011-06-23: 0.25 mg via ORAL
  Filled 2011-06-23: qty 1

## 2011-06-23 MED ORDER — SODIUM CHLORIDE 0.9 % IV SOLN: 250.0000 mL | INTRAVENOUS | Status: DC | PRN

## 2011-06-23 MED ORDER — ONDANSETRON HCL 4 MG/2ML IJ SOLN: 4.0000 mg | Freq: Four times a day (QID) | INTRAMUSCULAR | Status: DC | PRN

## 2011-06-23 MED ORDER — SODIUM CHLORIDE 0.9 % IV SOLN
INTRAVENOUS | Status: DC
  Administered 2011-06-23: 07:00:00 via INTRAVENOUS

## 2011-06-23 MED ORDER — ZOLPIDEM TARTRATE 5 MG PO TABS: 5.0000 mg | ORAL_TABLET | Freq: Every evening | ORAL | Status: DC | PRN

## 2011-06-23 MED ORDER — NEBIVOLOL HCL 2.5 MG PO TABS
2.5000 mg | ORAL_TABLET | Freq: Every day | ORAL | Status: DC
  Administered 2011-06-23 – 2011-06-25 (×3): 2.5 mg via ORAL
  Filled 2011-06-23 (×4): qty 1

## 2011-06-23 MED ORDER — ASPIRIN 81 MG PO CHEW
324.0000 mg | CHEWABLE_TABLET | ORAL | Status: AC
  Administered 2011-06-23: 324 mg via ORAL

## 2011-06-23 MED ORDER — ATORVASTATIN CALCIUM 20 MG PO TABS
20.0000 mg | ORAL_TABLET | Freq: Every day | ORAL | Status: DC
  Administered 2011-06-23 – 2011-06-25 (×3): 20 mg via ORAL
  Filled 2011-06-23 (×4): qty 1

## 2011-06-23 MED ORDER — ASPIRIN EC 81 MG PO TBEC
81.0000 mg | DELAYED_RELEASE_TABLET | Freq: Every morning | ORAL | Status: DC
  Administered 2011-06-24 – 2011-06-25 (×2): 81 mg via ORAL
  Filled 2011-06-23 (×3): qty 1

## 2011-06-23 MED ORDER — VITAMIN D (ERGOCALCIFEROL) 1.25 MG (50000 UNIT) PO CAPS: 50000.0000 [IU] | ORAL_CAPSULE | ORAL | Status: DC

## 2011-06-23 MED ORDER — IRBESARTAN 150 MG PO TABS
150.0000 mg | ORAL_TABLET | Freq: Every day | ORAL | Status: DC
  Administered 2011-06-24 – 2011-06-25 (×2): 150 mg via ORAL
  Filled 2011-06-23 (×3): qty 1

## 2011-06-23 MED ORDER — ALBUTEROL SULFATE (5 MG/ML) 0.5% IN NEBU
2.5000 mg | INHALATION_SOLUTION | Freq: Once | RESPIRATORY_TRACT | Status: AC
  Administered 2011-06-23: 2.5 mg via RESPIRATORY_TRACT

## 2011-06-23 MED ORDER — DIAZEPAM 5 MG PO TABS
5.0000 mg | ORAL_TABLET | ORAL | Status: AC
  Administered 2011-06-23: 5 mg via ORAL

## 2011-06-23 MED ORDER — PANTOPRAZOLE SODIUM 40 MG PO TBEC
40.0000 mg | DELAYED_RELEASE_TABLET | Freq: Every day | ORAL | Status: DC
  Administered 2011-06-23 – 2011-06-24 (×2): 40 mg via ORAL
  Filled 2011-06-23 (×2): qty 1

## 2011-06-23 MED ORDER — DOXEPIN HCL 10 MG PO CAPS
10.0000 mg | ORAL_CAPSULE | Freq: Every evening | ORAL | Status: DC
  Administered 2011-06-23 – 2011-06-25 (×3): 10 mg via ORAL
  Filled 2011-06-23 (×4): qty 1

## 2011-06-23 NOTE — OR Nursing (Signed)
Tegaderm dressing applied, site level 0, bedrest begins at 0850

## 2011-06-23 NOTE — H&P (Signed)
The patient has a mildly abnormal Cardiolite study from approximately 6 months ago. In that time frame chest discomfort episodes have increased and become more severe and additionally she has developed jaw discomfort. This study is being done to rule out coronary artery disease. Since the decision to perform this study and the dictated H&P (please see scanned documents) no changes have occurred at her exam is unchanged.

## 2011-06-23 NOTE — H&P (Signed)
Office Visit     Patient: [name], [name] Provider: Verdis Prime, MD  DOB: 09/20/1940 Age: 71 Y Sex: Female Date: 06/21/2011  Phone: [phone]   Address: [address] [place], [id number]  Pcp: Rodrigo Ran, MD        Subjective:     CC:    1. FOLLOW UP. 2. dizziness,SOB,chest and back discomfort x 2 months----see previous telephone encounters.        HPI:  General:  There is a history of chest pain, mid sternal radiating to back, accociated with dyspnea and arm heaviness. This is progressive since the last OV and abnormal nuclear study 6 months ago. She is now also having episode of jaw discomfort associated with the other complaints.   The other complaint is extreme fatigue and dizziness nearly continuously. She wonders if it is med related..        Medical History: Hypertension, Hyperlipidemia, PVC's, Hypothyroidism, Addison's Disease, Obesity.        Surgical History: Lumbar fusion, anterior , Right knee replacement 2006, Right knee replacement 2009, Cholecystectomy , Left Carpel tunnel , Bone spur removal, multiple , feet and shoulders , Left knee replacement 2010.        Family History: Father: deceased 67 yrs Heart / MI Mother: deceased 43 yrs CVA        Social History:  General:  History of smoking  cigarettes: Never smoked no Smoking.  no Tobacco Exposure.  no Alcohol.  no Exercise.  Occupation: unemployed, retired.  Education: yes.  Marital Status: single, widowed.  Children: 1, son (s).        Medications: Prilosec 20 MG Capsule Delayed Release 1 capsule Once a day, Prozac 20 MG Capsule 1 capsule in the morning Once a day, Levothyroxine Sodium 25 MCG Tablet 2 tablets Once a day, Hydrocortisone 10 MG Tablet 1 tablet twice a day, Vitamin B Complex Capsule as directed , Diovan 160 MG Tablet 1 tablet Once a day, Nitroglycerin 0.4 mg 0.4 mg tablet 1 tablet as directed as directed prn chest pain, Aspir-81 81 MG Tablet Delayed Release 1 tablet Once a  day, Bystolic 5 MG Tablet 1/2 tablet Once a day, Zetia 10 MG Tablet 1 tablet twice a week followed by Dr Waynard Edwards, Livalo 4 mg Tablet 1 tab once a week, Medication List reviewed and reconciled with the patient       Allergies: Percocet, sulfa.       Objective:     Vitals: Wt 205, Wt change 3 lb, Ht 64.5, BMI 34.64, Pulse sitting 84, BP sitting 160/92.       Examination:  Cardiology Exam:  GENERAL APPEARANCE: pleasant, NAD, comfortable, obese, female, pleasant.  HEENT: normal.  CAROTID UPSTROKE: no bruit, upstrokes intact.  JVD: flat.  HEART: regular rate and rhythm, normal S1S2, no rub, no gallop, or click.  LUNGS: clear to auscultation, no wheezing/rhonchi/rales.  ABDOMEN: soft, non-tender, no hepatomegaly, no masses palpated.  EXTREMITIES: no leg edema.  PERIPHERAL PULSES: 2+, bilateral.  NEUROLOGIC: grossly intact, cranial nerves intact, gait WNL.  MOOD: normal.        Assessment:     Assessment:  1. Angina pectoris - 413.9 (Primary)  2. Abnormal nuclear stress test - 794.39  3. Benign essential HTN - 401.1  4. Dizziness - 780.4  5. Elevated fasting lipid profile - 272.4    Plan:     1. Angina pectoris Continue Bystolic Tablet, 5 MG, 1/2 tablet, Orally, Once a day ; Continue Aspir-81 Tablet Delayed  Release, 81 MG, 1 tablet, Orally, Once a day ; Continue Nitroglycerin 0.4 mg tablet, 0.4 mg, 1 tablet as directed, SL, as directed prn chest pain .  LAB: Comp Metabolic Panel    GLUCOSE 105  70-99 - mg/dL H   BUN 13  4-09 - mg/dL    CREATININE 8.11  9.14-7.82 - mg/dl    eGFR (NON-AFRICAN AMERICAN) 77 >60 - calc    eGFR (AFRICAN AMERICAN) 93 >60 - calc    SODIUM 142  136-145 - mmol/L    POTASSIUM 4.5  3.5-5.5 - mmol/L    CHLORIDE 104  98-107 - mmol/L    C02 30  22-32 - mg/dL    ANION GAP 95.6 2.1-30.8 - mmol/L    CALCIUM 10.0  8.6-10.3 - mg/dL    T PROTEIN 7.1  6.5-7.8 - g/dL    ALBUMIN 4.1  4.6-9.6 - g/dL    T.BILI 0.6  0.3-1.0 - mg/dL    ALP 73  29-528 - U/L     AST 22  0-39 - U/L    ALT 20  0-52 - U/L     LAB: CBC with Diff    WBC 10.1 4.0-11.0 - K/ul    RBC 4.41 4.20-5.40 - M/uL    HGB 14.1 12.0-16.0 - g/dL    HCT 41.3 24.4-01.0 - %    MCH 31.9 27.0-33.0 - pg    MPV 7.8 7.5-10.7 - fL    MCV 93.0 81.0-99.0 - fL    MCHC 34.3 32.0-36.0 - g/dL    RDW 27.2 53.6-64.4 - %    PLT 252 150-400 - K/uL    NEUT % 63.2 43.3-71.9 - %    LYMPH% 24.5 16.8-43.5 - %    MONO % 9.9 4.6-12.4 - %    EOS % 1.5 0.0-7.8 - %    BASO % 0.9 0.0-1.0 - %    NEUT # 6.3 1.9-7.2 - K/uL    LYMPH# 2.50 1.10-2.70 - K/uL    MONO # 1.0 0.3-0.8 - K/uL H   EOS # 0.2 0.0-0.6 - K/uL    BASO # 0.1 0.0-0.1 - K/uL     LAB: PT (Prothrombin Time) ([id number])     Romano,Tamara 06/21/2011 05:02:16 PM > specimen rejected by [facility]-testing peformed by Riki Rusk    The patient was counseled to undergo heart catheterization including coronary arteriography and hemodynamic recordings in the heart and lungs. The nature of the procedure, attendant risks, alternatives, and questions were discussed in detail. Risks including but not limited to bleeding, stroke, death, heart attack, allergy, renal failure, arterial insufficiency, and vessel perforation were discussed in detail. The patient accepted the procedure and is willing to proceed.       2. Benign essential HTN Continue Diovan Tablet, 160 MG, 1 tablet, Orally, Once a day ; Continue Bystolic Tablet, 5 MG, 1/2 tablet, Orally, Once a day .  Diagnostic Imaging:EKG NSR with no abn., Boehler,Eileen 06/21/2011 11:27:25 AM > Lovette Merta 06/21/2011 12:05:46 PM >       3. Elevated fasting lipid profile Continue Zetia Tablet, 10 MG, 1 tablet, Orally, twice a week followed by Dr Waynard Edwards ; Continue Livalo 4 mg Tablet, 1 tab, Orally, once a week .       4. Others Continue Prilosec Capsule Delayed Release, 20 MG, 1 capsule, Orally, Once a day ; Continue Prozac Capsule, 20 MG, 1 capsule in the morning, Orally, Once a day ; Continue Levothyroxine Sodium Tablet,  25 MCG, 2 tablets, Orally, Once a day ;  Continue Hydrocortisone Tablet, 10 MG, 1 tablet, Orally, twice a day ; Continue Vitamin B Complex Capsule, as directed, Orally .        Immunizations:        Labs:        Procedure Codes: 16109 EKG I AND R, 80053 ECL COMP METABOLIC PANEL, 85025 ECL CBC PLATELET DIFF, 60454 BLOOD COLLECTION ROUTINE VENIPUNCTURE       Preventive:        Provider: Verdis Prime, MD  Patient: [name], [name] DOB: 27-Mar-1941 Date: 06/21/2011

## 2011-06-23 NOTE — Consult Note (Signed)
Reason for Consult:3 vessel CAD  Referring Physician: Henry Smith, MD  [name] is an 71 y.o. female.  HPI: 71 yo with multiple cardiac risk factors who presents with cc/o chest pain. Pain not necessarily exertional. Discomfort in mid chest radiates to back, and jaw pain. SOB with 1 flight of stairs. 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  Past Medical History   Diagnosis  Date   .  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 last knee surgery dr. allusio gave large dose of hydrocortisal and avoided the hypotensive side effectas of addison's disease.   .  PONV (postoperative nausea and vomiting)    .  Coronary artery disease    .  Angina    .  Hypertension    .  Dysrhythmia    .  Shortness of breath    .  Recurrent upper respiratory infection (URI)    .  Hypothyroidism    .  GERD (gastroesophageal reflux disease)    .  Arthritis    .  Anxiety    .  Depression     Past Surgical History   Procedure  Date   .  Back surgery    .  Tonsillectomy    .  Diagnostic laparoscopy    .  Cardiac catheterization    .  Eye surgery    .  Dilation and curettage of uterus    .  Joint replacement    .  Cholecystectomy     History reviewed. No pertinent family history.  Social History: does not have a smoking history on file. She does not have any smokeless tobacco history on file. She reports that she does not drink alcohol or use illicit drugs.  Allergies:  Allergies   Allergen  Reactions   .  Percocet (Oxycodone-Acetaminophen)      Hallucination   .  Sulfa Antibiotics  Itching     Vaginal itching    Medications:  I have reviewed the patient's current medications.  Prior to Admission:  Prescriptions prior to admission   Medication  Sig  Dispense  Refill   .  aspirin EC 81 MG tablet  Take 81 mg by mouth every morning.     .  doxepin (SINEQUAN) 10 MG capsule  Take 10 mg by mouth every evening.     .  ezetimibe (ZETIA) 10 MG tablet  Take 10  mg by mouth 2 (two) times a week. Tuesdays and Thursday     .  FLUoxetine (PROZAC) 20 MG capsule  Take 20 mg by mouth every morning.     .  hydrocortisone (CORTEF) 5 MG tablet  Take 10 mg by mouth daily.     .  nebivolol (BYSTOLIC) 5 MG tablet  Take 2.5 mg by mouth at bedtime.     .  omeprazole (PRILOSEC) 20 MG capsule  Take 20 mg by mouth every morning.     .  Pitavastatin Calcium (LIVALO) 4 MG TABS  Take 1 tablet by mouth daily.     .  valsartan (DIOVAN) 160 MG tablet  Take 160 mg by mouth at bedtime.     .  Vitamin D, Ergocalciferol, (DRISDOL) 50000 UNITS CAPS  Take 50,000 Units by mouth every 7 (seven) days. On wednesdays      No results found for this or any previous visit (from the past 48 hour(s)).  No results found.  Review of Systems  Constitutional:   Positive for malaise/fatigue.  Trouble with low BP with anesthesia due to Addison's  Respiratory: Positive for shortness of breath (walking up stairs).  Cardiovascular: Positive for chest pain and leg swelling. Negative for orthopnea and claudication.  Varicosities bilateral  Gastrointestinal: Negative.  Genitourinary: Negative.  Musculoskeletal: Positive for joint pain.  2 R TKR  1L TKR  Skin: Negative.  Neurological: Negative.  Endo/Heme/Allergies: Does not bruise/bleed easily.  All other systems reviewed and are negative.   Blood pressure 116/54, pulse 65, temperature 98.1 F (36.7 Scott), temperature source Oral, resp. rate 20, height 5' 4" (1.626 m), weight 197 lb 1.5 oz (89.4 kg), SpO2 97.00%.  Physical Exam  Vitals reviewed.  Constitutional: She is oriented to person, place, and time. She appears well-developed and well-nourished. No distress.  obese  HENT:  Head: Normocephalic and atraumatic.  Eyes: EOM are normal. Pupils are equal, round, and reactive to light.  Neck: Normal range of motion. No JVD present. No thyromegaly present.  Cardiovascular: Normal rate, regular rhythm, normal heart sounds and intact distal  pulses. Exam reveals no gallop and no friction rub.  No murmur heard.  Respiratory: Effort normal and breath sounds normal. No respiratory distress. She has no wheezes. She has no rales.  GI: Soft. There is no tenderness.  Musculoskeletal: She exhibits no edema.  Lymphadenopathy:  She has no cervical adenopathy.  Neurological: She is alert and oriented to person, place, and time. No cranial nerve deficit.  Skin: Skin is warm and dry.   Assessment/Plan:  71 yo WF with 3 vessel CAD and angina. CABG indicated for survival benefit and relief of symptoms.  I have discussed with the patient the general nature of the procedure, need for general anesthesia,and incisions to be used. I have discussed the expected hospital stay, overall recovery and short and long term outcomes. She understands the risks include but are not limited to death, stroke, MI, DVT/PE, bleeding, possible need for transfusion, infections, other organ system dysfunction including respiratory, renal, or GI complications. She understands and accepts these risks and agrees to proceed.  For OR Monday 1st case  [name]  06/23/2011, 8:46 PM   

## 2011-06-23 NOTE — OR Nursing (Signed)
Dr Smith at bedside to discuss results and treatment plan with pt and family 

## 2011-06-23 NOTE — Progress Notes (Signed)
ANTICOAGULATION CONSULT NOTE - Follow Up Consult  Pharmacy Consult for Heparin Indication: 3V CAD while awaiting CABG  Patient Measurements: Height: 5\' 4"  (162.6 cm) Weight: 197 lb 1.5 oz (89.4 kg) IBW/kg (Calculated) : 54.7  Heparin Dosing Weight: 75 kg  Labs:  Basename 06/23/11 2157  HGB --  HCT --  PLT --  APTT --  LABPROT --  INR --  HEPARINUNFRC 0.20*  CREATININE --  CKTOTAL --  CKMB --  TROPONINI --   Estimated Creatinine Clearance: 68.8 ml/min (by C-G formula based on Cr of 0.62).  Assessment: 71 y.o. F on heparin for 3V CAD while awaiting CABG plans with a SUBtherapeutic heparin level this evening (HL 0.2, goal of 0.3-0.7). Level may continue to trend up slightly as not quite as steady state so will not be too aggressive with dose increases. No s/sx of bleeding noted per discussion with nurse.   Goal of Therapy:  Heparin level 0.3-0.7 units/ml   Plan:  1. Increase heparin drip rate to 1200 units/hr (12 ml/hr) 2. Will continue to monitor for any signs/symptoms of bleeding and will follow up with heparin level in 8 hours    Georgina Pillion, PharmD, BCPS Clinical Pharmacist Pager: [phone] 06/23/2011 10:51 PM

## 2011-06-23 NOTE — Progress Notes (Signed)
ANTICOAGULATION CONSULT NOTE - Initial Consult  Pharmacy Consult for Heparin Indication: Post-cath  Allergies  Allergen Reactions  . Percocet (Oxycodone-Acetaminophen)     Hallucination  . Sulfa Antibiotics Itching    Vaginal itching    Patient Measurements:   Heparin Dosing Weight: 75 kg  Vital Signs: Temp: 98.1 F (36.7 C) (03/22 1226) BP: 116/54 mmHg (03/22 1226) Pulse Rate: 65  (03/22 1226)  Labs: No results found for this basename: HGB:2,HCT:3,PLT:3,APTT:3,LABPROT:3,INR:3,HEPARINUNFRC:3,CREATININE:3,CKTOTAL:3,CKMB:3,TROPONINI:3 in the last 72 hours The CrCl is unknown because both a height and weight (above a minimum accepted value) are required for this calculation.  Medical History: No past medical history on file.  Medications:  Prescriptions prior to admission  Medication Sig Dispense Refill  . aspirin EC 81 MG tablet Take 81 mg by mouth every morning.      [name] doxepin (SINEQUAN) 10 MG capsule Take 10 mg by mouth every evening.      . ezetimibe (ZETIA) 10 MG tablet Take 10 mg by mouth 2 (two) times a week. Tuesdays and Thursday      . FLUoxetine (PROZAC) 20 MG capsule Take 20 mg by mouth every morning.      . hydrocortisone (CORTEF) 5 MG tablet Take 10 mg by mouth daily.      . nebivolol (BYSTOLIC) 5 MG tablet Take 2.5 mg by mouth at bedtime.      [name] omeprazole (PRILOSEC) 20 MG capsule Take 20 mg by mouth every morning.      . Pitavastatin Calcium (LIVALO) 4 MG TABS Take 1 tablet by mouth daily.      . valsartan (DIOVAN) 160 MG tablet Take 160 mg by mouth at bedtime.      . Vitamin D, Ergocalciferol, (DRISDOL) 50000 UNITS CAPS Take 50,000 Units by mouth every 7 (seven) days. On wednesdays        Assessment: Radiating CP, dyspnea, arm heaviness. Cath shows severe 3V CAD. Normal LV function. TCTS consult. Start IV heparin 8 hrs after sheath removed (0809 this am)  Cards: HTN, HLD, PVCs  Endo: Hypothyroid, Addison's dz  GI/Nutrition: obesity  Goal of Therapy:    Heparin level 0.3-0.7 units/ml   Plan:  At 1610, start Iv heparin (no bolus) at 1050 units/hr. Check heparin level 6 hrs later and daily.  Merilynn Finland, [employer] 06/23/2011,2:47 PM

## 2011-06-23 NOTE — CV Procedure (Signed)
     Diagnostic Cardiac Catheterization Report  [name]  71 y.o.  female [birth year]  Procedure Date: 06/23/2011  Referring Physician: Rodrigo Ran, MD  Primary Cardiologist::  Wayne Both, III, MD   PROCEDURE:  Left heart catheterization with selective coronary angiography, left ventriculogram.  INDICATIONS:  Refractory angina with a mildly abnormal nuclear stress test 6 months ago. The study is being done to define coronary anatomy for  The risks, benefits, and details of the procedure were explained to the patient.  The patient verbalized understanding and wanted to proceed.  Informed written consent was obtained.  PROCEDURE TECHNIQUE:  After Xylocaine anesthesia a 4 French sheath was placed in the right femoral artery with a single anterior needle wall stick.   Coronary angiography was done using a 4 [place] JR, JL, and A2 MP catheter.  Left ventriculography was done using a A2 MP catheter.    CONTRAST:  Total of 60 cc.  COMPLICATIONS:  None.    HEMODYNAMICS:  Aortic pressure was 114/54 mm mercury; LV pressure was 114/9 mmHg; LVEDP 9 mm mercury.  There was no gradient between the left ventricle and aorta.    ANGIOGRAPHIC DATA:   The left main coronary artery is heavily calcified but widely patent.  The left anterior descending artery is heavily calcified, poor to [place], and with greater than 90% proximal stenosis followed by segmental 50-70% stenosis.  The left circumflex artery is 99% proximal stenosis followed by 90% mid stenosis before the vessel bifurcates.  The right coronary artery is proximal eccentric 50-70% stenosis.  LEFT VENTRICULOGRAM:  Left ventricular angiogram was done in the 30 RAO projection and revealed normal left ventricular wall motion and systolic function with an estimated ejection fraction of 65 %.   IMPRESSIONS:  1. Severe three-vessel coronary disease with heavy calcification throughout the proximal to mid LAD territory.  2. Normal left  ventricular function.  3. Recurring episodes of rest pain consistent with angina. Her angina is bilateral jaw pain as well as chest pressure radiating through to her back.   RECOMMENDATION:    1. IV heparin  2. IV nitroglycerin  3. TCTS consultation for coronary bypass surgery.  4. Will be higher than usual risk due to adrenal insufficiency.[name]

## 2011-06-23 NOTE — OR Nursing (Signed)
Meal served 

## 2011-06-23 NOTE — OR Nursing (Signed)
Report called to Inetta Fermo, RN on [phone]

## 2011-06-24 DIAGNOSIS — Z01810 Encounter for preprocedural cardiovascular examination: Secondary | ICD-10-CM

## 2011-06-24 DIAGNOSIS — I2510 Atherosclerotic heart disease of native coronary artery without angina pectoris: Principal | ICD-10-CM

## 2011-06-24 LAB — CBC
Hemoglobin: 11.7 g/dL — ABNORMAL LOW (ref 12.0–15.0)
MCV: 91.6 fL (ref 78.0–100.0)
Platelets: 209 10*3/uL (ref 150–400)
RBC: 3.81 MIL/uL — ABNORMAL LOW (ref 3.87–5.11)
WBC: 7.3 10*3/uL (ref 4.0–10.5)

## 2011-06-24 MED ORDER — HYDROCORTISONE 10 MG PO TABS
10.0000 mg | ORAL_TABLET | Freq: Two times a day (BID) | ORAL | Status: DC
  Administered 2011-06-24 – 2011-06-25 (×3): 10 mg via ORAL
  Filled 2011-06-24 (×5): qty 1

## 2011-06-24 MED ORDER — DOCUSATE SODIUM 100 MG PO CAPS
100.0000 mg | ORAL_CAPSULE | Freq: Two times a day (BID) | ORAL | Status: DC | PRN
  Filled 2011-06-24: qty 1

## 2011-06-24 NOTE — Progress Notes (Signed)
ANTICOAGULATION CONSULT NOTE - Follow Up Consult  Pharmacy Consult for Heparin Indication: 3V CAD while awaiting CABG  Allergies  Allergen Reactions  . Percocet (Oxycodone-Acetaminophen)     Hallucination  . Sulfa Antibiotics Itching    Vaginal itching    Patient Measurements: Height: 5\' 4"  (162.6 cm) Weight: 197 lb 1.5 oz (89.4 kg) IBW/kg (Calculated) : 54.7  Heparin Dosing Weight:   Vital Signs: Temp: 97.1 F (36.2 C) (03/23 0500) Temp src: Oral (03/23 0500) BP: 131/76 mmHg (03/23 0500) Pulse Rate: 52  (03/23 0500)  Labs:  Basename 06/24/11 0800 06/23/11 2157  HGB 11.7* --  HCT 34.9* --  PLT 209 --  APTT -- --  LABPROT -- --  INR -- --  HEPARINUNFRC 0.39 0.20*  CREATININE -- --  CKTOTAL -- --  CKMB -- --  TROPONINI -- --   Estimated Creatinine Clearance: 68.8 ml/min (by C-G formula based on Cr of 0.62).    Assessment: Heparin level is 0.39 on 1200 units/hr. Goal HL is 0.3=0.7. Heparin level is within desired range. Goal of Therapy:  Heparin level 0.3-0.7 units/ml   Plan:  Heparin level is therapeutic. No change in rate. Will f/u AM level and adjust as needed.  [name], Pharm D  [name] 06/24/2011,9:15 AM

## 2011-06-24 NOTE — Progress Notes (Signed)
  Patient Name: [name]      SUBJECTIVE: Admitted for chest pain and abnormal Myoview. Underwent catheterization demonstrating severe three-vessel disease including proximal LAD. She is referred to vascular surgery for bypass consideration. She .was seen in consultation by Dr. Dorris Fetch and bypass is anticipated on Monday   Some mild chest pain  Past Medical History  Diagnosis Date  . 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  last knee surgery dr. Berton Lan gave large dose of hydrocortisal and avoided the hypotensive side effectas of addison's disease.  [name] PONV (postoperative nausea and vomiting)   . Coronary artery disease   . Angina   . Hypertension   . Dysrhythmia   . Shortness of breath   . Recurrent upper respiratory infection (URI)   . Hypothyroidism   . GERD (gastroesophageal reflux disease)   . Arthritis   . Anxiety   . Depression     PHYSICAL EXAM Filed Vitals:   06/23/11 2045 06/23/11 2208 06/24/11 0129 06/24/11 0500  BP: 148/82 116/53 132/62 131/76  Pulse: 80 58  52  Temp: 97.7 F (36.5 C)   97.1 F (36.2 C)  TempSrc: Oral   Oral  Resp: 19   18  Height:      Weight:      SpO2: 93%   91%   Well developed and nourished in no acute distress HENT normal Neck supple with JVP-flat Clear Regular rate and rhythm, no murmurs or gallops Abd-soft with active BS No Clubbing cyanosis edema Skin-warm and dry A & Oriented  Grossly normal sensory and motor function   TELEMETRY: Reviewed telemetry pt in *nsr     Intake/Output Summary (Last 24 hours) at 06/24/11 0934 Last data filed at 06/23/11 1300  Gross per 24 hour  Intake    240 ml  Output      0 ml  Net    240 ml    LABS: Basic Metabolic Panel: No results found for this basename: NA:7,K:7,CL:7,CO2:7,GLUCOSE:7,BUN:7,CREATININE:7,CALCIUM:2,MG:2,PHOS:2 in the last 168 hours Cardiac Enzymes: No results found for this basename:  CKTOTAL:3,CKMB:3,CKMBINDEX:3,TROPONINI:3 in the last 72 hours CBC:  Lab 06/24/11 0800  WBC 7.3  NEUTROABS --  HGB 11.7*  HCT 34.9*  MCV 91.6  PLT 209     ASSESSMENT AND PLAN:  CAD  3v  For CABG monday  Signed, Sherryl Manges MD  06/24/2011

## 2011-06-24 NOTE — Progress Notes (Signed)
VASCULAR LAB PRELIMINARY  PRELIMINARY  PRELIMINARY  PRELIMINARY  Pre-op Cardiac Surgery  Carotid Findings:  Bilateral: No evidence of hemodynamically significant internal carotid artery stenosis.  Right:  Vertebral artery  demonstrates the "bunny sign" waveform.  Left: Vertebral artery flow is antegrade.    Upper Extremity Right Left  Brachial Pressures    Radial Waveforms    Ulnar Waveforms    Palmar Arch (Allen's Test)     Findings:      Lower  Extremity Right Left  Dorsalis Pedis    Anterior Tibial    Posterior Tibial    Ankle/Brachial Indices Bilateral palpable pulses.      Findings:  Bilateral palpable pulses.    Terance Hart, RVT 06/24/2011, 10:57 AM

## 2011-06-25 DIAGNOSIS — I2510 Atherosclerotic heart disease of native coronary artery without angina pectoris: Secondary | ICD-10-CM

## 2011-06-25 LAB — BLOOD GAS, ARTERIAL
Acid-base deficit: 1.9 mmol/L (ref 0.0–2.0)
TCO2: 23 mmol/L (ref 0–100)
pCO2 arterial: 34.8 mmHg — ABNORMAL LOW (ref 35.0–45.0)

## 2011-06-25 LAB — URINALYSIS, ROUTINE W REFLEX MICROSCOPIC
Glucose, UA: NEGATIVE mg/dL
Ketones, ur: NEGATIVE mg/dL
Leukocytes, UA: NEGATIVE
Protein, ur: NEGATIVE mg/dL

## 2011-06-25 LAB — BASIC METABOLIC PANEL
Calcium: 8.7 mg/dL (ref 8.4–10.5)
Creatinine, Ser: 0.7 mg/dL (ref 0.50–1.10)
GFR calc Af Amer: >90 mL/min (ref >90)
GFR calc non Af Amer: 85 mL/min — ABNORMAL LOW (ref >90)

## 2011-06-25 LAB — ABO/RH: ABO/RH(D): B POS

## 2011-06-25 LAB — PROTIME-INR: Prothrombin Time: 13.6 seconds (ref 11.6–15.2)

## 2011-06-25 LAB — HEPARIN LEVEL (UNFRACTIONATED): Heparin Unfractionated: 0.61 IU/mL (ref 0.30–0.70)

## 2011-06-25 LAB — CBC
HCT: 34 % — ABNORMAL LOW (ref 36.0–46.0)
Hemoglobin: 11.3 g/dL — ABNORMAL LOW (ref 12.0–15.0)
MCH: 30.1 pg (ref 26.0–34.0)
RBC: 3.75 MIL/uL — ABNORMAL LOW (ref 3.87–5.11)

## 2011-06-25 MED ORDER — DEXTROSE 5 % IV SOLN
750.0000 mg | INTRAVENOUS | Status: DC
  Filled 2011-06-25: qty 750

## 2011-06-25 MED ORDER — MAGNESIUM SULFATE 50 % IJ SOLN
40.0000 meq | INTRAMUSCULAR | Status: DC
  Filled 2011-06-25: qty 10

## 2011-06-25 MED ORDER — POTASSIUM CHLORIDE 2 MEQ/ML IV SOLN
80.0000 meq | INTRAVENOUS | Status: DC
  Filled 2011-06-25 (×2): qty 40

## 2011-06-25 MED ORDER — DEXTROSE 5 % IV SOLN
1.5000 g | INTRAVENOUS | Status: AC
  Administered 2011-06-26: 1.5 g via INTRAVENOUS
  Administered 2011-06-26: .75 g via INTRAVENOUS
  Filled 2011-06-25: qty 1.5

## 2011-06-25 MED ORDER — METOPROLOL TARTRATE 12.5 MG HALF TABLET
12.5000 mg | ORAL_TABLET | Freq: Once | ORAL | Status: DC
  Filled 2011-06-25: qty 1

## 2011-06-25 MED ORDER — VANCOMYCIN HCL 1000 MG IV SOLR
1500.0000 mg | INTRAVENOUS | Status: AC
  Administered 2011-06-26: 1500 mg via INTRAVENOUS
  Filled 2011-06-25: qty 1500

## 2011-06-25 MED ORDER — CHLORHEXIDINE GLUCONATE 4 % EX LIQD
60.0000 mL | Freq: Once | CUTANEOUS | Status: DC
  Filled 2011-06-25: qty 60

## 2011-06-25 MED ORDER — SODIUM CHLORIDE 0.9 % IV SOLN
0.1000 ug/kg/h | INTRAVENOUS | Status: DC
  Filled 2011-06-25: qty 4

## 2011-06-25 MED ORDER — SODIUM CHLORIDE 0.9 % IV SOLN
INTRAVENOUS | Status: AC
  Administered 2011-06-26: 1.7 [IU]/h via INTRAVENOUS
  Filled 2011-06-25: qty 1

## 2011-06-25 MED ORDER — BISACODYL 5 MG PO TBEC
5.0000 mg | DELAYED_RELEASE_TABLET | Freq: Once | ORAL | Status: DC
  Filled 2011-06-25: qty 1

## 2011-06-25 MED ORDER — SODIUM CHLORIDE 0.9 % IV SOLN
INTRAVENOUS | Status: AC
  Administered 2011-06-26: 69 mL/h via INTRAVENOUS
  Filled 2011-06-25: qty 40

## 2011-06-25 MED ORDER — DOPAMINE-DEXTROSE 3.2-5 MG/ML-% IV SOLN
2.0000 ug/kg/min | INTRAVENOUS | Status: DC
Start: 2011-06-26 — End: 2011-06-26
  Filled 2011-06-25: qty 250

## 2011-06-25 MED ORDER — CHLORHEXIDINE GLUCONATE 4 % EX LIQD: 60.0000 mL | Freq: Once | CUTANEOUS | Status: DC

## 2011-06-25 MED ORDER — VERAPAMIL HCL 2.5 MG/ML IV SOLN
INTRAVENOUS | Status: AC
  Administered 2011-06-26: 09:00:00
  Filled 2011-06-25 (×2): qty 2.5

## 2011-06-25 MED ORDER — NITROGLYCERIN IN D5W 200-5 MCG/ML-% IV SOLN
2.0000 ug/min | INTRAVENOUS | Status: DC
  Filled 2011-06-25: qty 250

## 2011-06-25 MED ORDER — TEMAZEPAM 15 MG PO CAPS: 15.0000 mg | ORAL_CAPSULE | Freq: Once | ORAL | Status: AC | PRN

## 2011-06-25 MED ORDER — CHLORHEXIDINE GLUCONATE 4 % EX LIQD
60.0000 mL | Freq: Once | CUTANEOUS | Status: AC
  Administered 2011-06-26: 4 via TOPICAL
  Filled 2011-06-25: qty 60

## 2011-06-25 MED ORDER — PHENYLEPHRINE HCL 10 MG/ML IJ SOLN
30.0000 ug/min | INTRAMUSCULAR | Status: DC
  Filled 2011-06-25: qty 2

## 2011-06-25 MED ORDER — DIAZEPAM 5 MG PO TABS
5.0000 mg | ORAL_TABLET | Freq: Once | ORAL | Status: AC
  Administered 2011-06-26: 5 mg via ORAL
  Filled 2011-06-25: qty 1

## 2011-06-25 MED ORDER — HYDROCORTISONE SOD SUCCINATE 100 MG IJ SOLR
100.0000 mg | Freq: Once | INTRAMUSCULAR | Status: AC
  Administered 2011-06-26: 100 mg via INTRAVENOUS
  Filled 2011-06-25: qty 2

## 2011-06-25 MED ORDER — EPINEPHRINE HCL 1 MG/ML IJ SOLN
0.5000 ug/min | INTRAMUSCULAR | Status: DC
  Filled 2011-06-25: qty 4

## 2011-06-25 NOTE — Progress Notes (Signed)
Pre-op Cardiac Surgery  Carotid Findings:    Upper Extremity Right Left  Brachial Pressures 155T 141T  Radial Waveforms T T  Ulnar Waveforms T T  Palmar Arch (Allen's Test) Doppler signal remains normal with radial compression and obliterates with ulnar compression Doppler signal remains normal with radial compression and obliterates with ulnar compression   Findings:      Lower  Extremity Right Left  Dorsalis Pedis    Anterior Tibial    Posterior Tibial    Ankle/Brachial Indices      Findings:

## 2011-06-25 NOTE — Progress Notes (Signed)
ANTICOAGULATION CONSULT NOTE - Follow Up Consult  Pharmacy Consult for heparin Indication: 3V CAD awaiting CABG on 3/25  Allergies  Allergen Reactions  . Percocet (Oxycodone-Acetaminophen)     Hallucination  . Sulfa Antibiotics Itching    Vaginal itching    Patient Measurements: Height: 5\' 4"  (162.6 cm) Weight: 197 lb 1.5 oz (89.4 kg) IBW/kg (Calculated) : 54.7  Heparin Dosing Weight:   Vital Signs: Temp: 98 F (36.7 C) (03/24 0500) BP: 117/72 mmHg (03/24 0500) Pulse Rate: 50  (03/24 0500)  Labs:  Basename 06/25/11 0630 06/24/11 0800 06/23/11 2157  HGB 11.3* 11.7* --  HCT 34.0* 34.9* --  PLT 205 209 --  APTT 92* -- --  LABPROT -- -- --  INR -- -- --  HEPARINUNFRC 0.61 0.39 0.20*  CREATININE 0.70 -- --  CKTOTAL -- -- --  CKMB -- -- --  TROPONINI -- -- --   Estimated Creatinine Clearance: 68.8 ml/min (by C-G formula based on Cr of 0.7).   Assessment: Heparin level is 0.61 today (goal 0.3-0.7). Plan is for pt to have CABG on 3/25 for 3V CAD. Goal of Therapy:  Heparin level 0.3-0.7 units/ml   Plan:  Heparin level is in desired range. No change in rate needed. Pt for CABG in AM.  [name] 06/25/2011,11:09 AM

## 2011-06-25 NOTE — Progress Notes (Signed)
  Patient Name: [name]      SUBJECTIVE: Admitted for chest pain and abnormal Myoview. Underwent catheterization demonstrating severe three-vessel disease including proximal LAD. She is referred to vascular surgery for bypass consideration. She .was seen in consultation by Dr. Dorris Fetch and bypass is anticipated on Monday    The patient denies SOB,   edema or palpitations. She continues w just mild discomfort   Past Medical History  Diagnosis Date  . 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  last knee surgery dr. Berton Lan gave large dose of hydrocortisal and avoided the hypotensive side effectas of addison's disease.  [name] PONV (postoperative nausea and vomiting)   . Coronary artery disease   . Angina   . Hypertension   . Dysrhythmia   . Shortness of breath   . Recurrent upper respiratory infection (URI)   . Hypothyroidism   . GERD (gastroesophageal reflux disease)   . Arthritis   . Anxiety   . Depression     PHYSICAL EXAM Filed Vitals:   06/24/11 0500 06/24/11 1335 06/24/11 2100 06/25/11 0500  BP: 131/76 134/68 118/62 117/72  Pulse: 52 60 63 50  Temp: 97.1 F (36.2 C) 97.6 F (36.4 C) 98.2 F (36.8 C) 98 F (36.7 C)  TempSrc: Oral     Resp: 18 20 18 20   Height:      Weight:      SpO2: 91% 97% 94% 96%   Well developed and nourished in no acute distress HENT normal Neck supple with JVP-flat Clear Regular rate and rhythm, no murmurs or gallops Abd-soft with active BS No Clubbing cyanosis edema Skin-warm and dry A & Oriented  Grossly normal sensory and motor function   TELEMETRY: Reviewed telemetry pt in *nsr     Intake/Output Summary (Last 24 hours) at 06/25/11 0957 Last data filed at 06/25/11 0700  Gross per 24 hour  Intake    240 ml  Output      0 ml  Net    240 ml    LABS: Basic Metabolic Panel:  Lab 06/25/11 1610  NA 141  K 3.7  CL 109  CO2 26  GLUCOSE 112*  BUN 11  CREATININE 0.70    CALCIUM 8.7  MG --  PHOS --   Cardiac Enzymes: No results found for this basename: CKTOTAL:3,CKMB:3,CKMBINDEX:3,TROPONINI:3 in the last 72 hours CBC:  Lab 06/25/11 0630 06/24/11 0800  WBC 6.8 7.3  NEUTROABS -- --  HGB 11.3* 11.7*  HCT 34.0* 34.9*  MCV 90.7 91.6  PLT 205 209     ASSESSMENT AND PLAN:  CAD  3v  For CABG monday decreae IV fluids Signed, Sherryl Manges MD  06/25/2011

## 2011-06-25 NOTE — Progress Notes (Signed)
Hand hygeine , washing and gelling discussed with pt especially when handling her surgical wound. Ancil Linsey RN

## 2011-06-25 NOTE — Progress Notes (Signed)
Pt [facility] education videos seen today, Coughing/ deep breathing exercises taught with more than adequate return demo. Use of IS and its importance discussed and practiced correctly. Wound splinting . Discussed  With return demo correctly. Use of Pain med as well as ambulation to progress pt discussed. Ancil Linsey RN

## 2011-06-25 NOTE — Progress Notes (Signed)
   CARDIOTHORACIC SURGERY PROGRESS NOTE  Subjective: No complaints.  No chest pain today.  Feels well.  Objective: Vital signs in last 24 hours: Temp:  [97.6 F (36.4 C)-98.2 F (36.8 C)] 98 F (36.7 C) (03/24 0500) Pulse Rate:  [50-63] 50  (03/24 0500) Cardiac Rhythm:  [-] Normal sinus rhythm (03/23 1930) Resp:  [18-20] 20  (03/24 0500) BP: (117-134)/(62-72) 117/72 mmHg (03/24 0500) SpO2:  [94 %-97 %] 96 % (03/24 0500)  Physical Exam:  Rhythm:   sinus  Breath sounds: clear  Heart sounds:  RRR  Incisions:  n/a  Abdomen:  soft  Extremities:  warm   Intake/Output from previous day: 03/23 0701 - 03/24 0700 In: 240 [P.O.:240] Out: -  Intake/Output this shift:    Lab Results:  Basename 06/25/11 0630 06/24/11 0800  WBC 6.8 7.3  HGB 11.3* 11.7*  HCT 34.0* 34.9*  PLT 205 209   BMET:  Basename 06/25/11 0630  NA 141  K 3.7  CL 109  CO2 26  GLUCOSE 112*  BUN 11  CREATININE 0.70  CALCIUM 8.7    CBG (last 3)  No results found for this basename: GLUCAP:3 in the last 72 hours PT/INR:  No results found for this basename: LABPROT,INR in the last 72 hours  CXR:  n/a  Assessment/Plan:  Clinically stable For OR tomorrow Will give IV solucortef on induction  Esmond Hinch H 06/25/2011 1:24 PM

## 2011-06-26 ENCOUNTER — Encounter (HOSPITAL_COMMUNITY)
Admission: AD | Disposition: A | Discharge status: Home / Self Care | Payer: Self-pay | Source: Ambulatory Visit | Attending: Thoracic Surgery (Cardiothoracic Vascular Surgery)

## 2011-06-26 ENCOUNTER — Inpatient Hospital Stay (HOSPITAL_COMMUNITY): Payer: Medicare Other | Admitting: Certified Registered Nurse Anesthetist

## 2011-06-26 ENCOUNTER — Other Ambulatory Visit: Payer: Self-pay

## 2011-06-26 ENCOUNTER — Inpatient Hospital Stay (HOSPITAL_COMMUNITY): Payer: Medicare Other

## 2011-06-26 ENCOUNTER — Encounter (HOSPITAL_COMMUNITY): Payer: Self-pay | Admitting: Certified Registered Nurse Anesthetist

## 2011-06-26 DIAGNOSIS — I2510 Atherosclerotic heart disease of native coronary artery without angina pectoris: Secondary | ICD-10-CM

## 2011-06-26 HISTORY — PX: CORONARY ARTERY BYPASS GRAFT: SHX141

## 2011-06-26 LAB — POCT I-STAT 4, (NA,K, GLUC, HGB,HCT)
Glucose, Bld: 135 mg/dL — ABNORMAL HIGH (ref 70–99)
Glucose, Bld: 142 mg/dL — ABNORMAL HIGH (ref 70–99)
HCT: 29 % — ABNORMAL LOW (ref 36.0–46.0)
HCT: 22 % — ABNORMAL LOW (ref 36.0–46.0)
HCT: 27 % — ABNORMAL LOW (ref 36.0–46.0)
Hemoglobin: 10.2 g/dL — ABNORMAL LOW (ref 12.0–15.0)
Hemoglobin: 7.5 g/dL — ABNORMAL LOW (ref 12.0–15.0)
Potassium: 3.5 mEq/L (ref 3.5–5.1)
Potassium: 4.3 mEq/L (ref 3.5–5.1)
Potassium: 3.6 mEq/L (ref 3.5–5.1)
Sodium: 141 mEq/L (ref 135–145)
Sodium: 135 mEq/L (ref 135–145)
Sodium: 139 mEq/L (ref 135–145)

## 2011-06-26 LAB — POCT I-STAT 3, ART BLOOD GAS (G3+)
Acid-base deficit: 4 mmol/L — ABNORMAL HIGH (ref 0.0–2.0)
Acid-base deficit: 1 mmol/L (ref 0.0–2.0)
Bicarbonate: 25.8 mEq/L — ABNORMAL HIGH (ref 20.0–24.0)
O2 Saturation: 100 %
O2 Saturation: 100 %
O2 Saturation: 93 %
O2 Saturation: 95 %
O2 Saturation: 97 %
O2 Saturation: 95 %
Patient temperature: 35.5
Patient temperature: 37.2
Patient temperature: 37.4
Patient temperature: 37
Patient temperature: 36.9
TCO2: 27 mmol/L (ref 0–100)
TCO2: 27 mmol/L (ref 0–100)
TCO2: 26 mmol/L (ref 0–100)
TCO2: 24 mmol/L (ref 0–100)
TCO2: 27 mmol/L (ref 0–100)
pCO2 arterial: 42.3 mmHg (ref 35.0–45.0)
pCO2 arterial: 34.7 mmHg — ABNORMAL LOW (ref 35.0–45.0)
pCO2 arterial: 55 mmHg — ABNORMAL HIGH (ref 35.0–45.0)
pH, Arterial: 7.393 (ref 7.350–7.400)
pH, Arterial: 7.306 — ABNORMAL LOW (ref 7.350–7.400)
pH, Arterial: 7.393 (ref 7.350–7.400)
pH, Arterial: 7.278 — ABNORMAL LOW (ref 7.350–7.400)
pH, Arterial: 7.274 — ABNORMAL LOW (ref 7.350–7.400)
pO2, Arterial: 284 mmHg — ABNORMAL HIGH (ref 80.0–100.0)

## 2011-06-26 LAB — CBC
HCT: 31 % — ABNORMAL LOW (ref 36.0–46.0)
MCHC: 34.1 g/dL (ref 30.0–36.0)
MCHC: 34.2 g/dL (ref 30.0–36.0)
MCV: 89.2 fL (ref 78.0–100.0)
MCV: 89.6 fL (ref 78.0–100.0)
Platelets: 140 10*3/uL — ABNORMAL LOW (ref 150–400)
RDW: 12.5 % (ref 11.5–15.5)
RDW: 12.6 % (ref 11.5–15.5)
WBC: 14.1 10*3/uL — ABNORMAL HIGH (ref 4.0–10.5)

## 2011-06-26 LAB — GLUCOSE, CAPILLARY
Glucose-Capillary: 109 mg/dL — ABNORMAL HIGH (ref 70–99)
Glucose-Capillary: 114 mg/dL — ABNORMAL HIGH (ref 70–99)
Glucose-Capillary: 133 mg/dL — ABNORMAL HIGH (ref 70–99)
Glucose-Capillary: 120 mg/dL — ABNORMAL HIGH (ref 70–99)
Glucose-Capillary: 109 mg/dL — ABNORMAL HIGH (ref 70–99)

## 2011-06-26 LAB — POCT I-STAT, CHEM 8
Calcium, Ion: 1.28 mmol/L (ref 1.12–1.32)
Creatinine, Ser: 0.7 mg/dL (ref 0.50–1.10)
Hemoglobin: 10.2 g/dL — ABNORMAL LOW (ref 12.0–15.0)
Sodium: 144 mEq/L (ref 135–145)
TCO2: 26 mmol/L (ref 0–100)

## 2011-06-26 LAB — APTT: aPTT: 29 seconds (ref 24–37)

## 2011-06-26 LAB — HEMOGLOBIN AND HEMATOCRIT, BLOOD: Hemoglobin: 7.6 g/dL — ABNORMAL LOW (ref 12.0–15.0)

## 2011-06-26 SURGERY — CORONARY ARTERY BYPASS GRAFTING (CABG)
Anesthesia: General | Site: Chest

## 2011-06-26 MED ORDER — SODIUM CHLORIDE 0.9 % IJ SOLN: 3.0000 mL | INTRAMUSCULAR | Status: DC | PRN

## 2011-06-26 MED ORDER — ONDANSETRON HCL 4 MG/2ML IJ SOLN
4.0000 mg | Freq: Four times a day (QID) | INTRAMUSCULAR | Status: DC | PRN
  Administered 2011-06-26 – 2011-06-28 (×2): 4 mg via INTRAVENOUS
  Filled 2011-06-26 (×2): qty 2

## 2011-06-26 MED ORDER — VANCOMYCIN HCL 1000 MG IV SOLR
1000.0000 mg | Freq: Once | INTRAVENOUS | Status: AC
Start: 2011-06-26 — End: 2011-06-26
  Administered 2011-06-26: 1000 mg via INTRAVENOUS
  Filled 2011-06-26: qty 1000

## 2011-06-26 MED ORDER — ACETAMINOPHEN 160 MG/5ML PO SOLN: 975.0000 mg | Freq: Four times a day (QID) | ORAL | Status: DC

## 2011-06-26 MED ORDER — METOPROLOL TARTRATE 25 MG/10 ML ORAL SUSPENSION
12.5000 mg | Freq: Two times a day (BID) | ORAL | Status: DC
  Filled 2011-06-26 (×5): qty 5

## 2011-06-26 MED ORDER — MAGNESIUM SULFATE 40 MG/ML IJ SOLN
4.0000 g | Freq: Once | INTRAMUSCULAR | Status: AC
  Administered 2011-06-26: 4 g via INTRAVENOUS
  Filled 2011-06-26: qty 100

## 2011-06-26 MED ORDER — ATORVASTATIN CALCIUM 20 MG PO TABS
20.0000 mg | ORAL_TABLET | Freq: Every day | ORAL | Status: DC
  Administered 2011-06-27 – 2011-06-29 (×3): 20 mg via ORAL
  Filled 2011-06-26 (×4): qty 1

## 2011-06-26 MED ORDER — POTASSIUM CHLORIDE 10 MEQ/50ML IV SOLN
10.0000 meq | Freq: Once | INTRAVENOUS | Status: AC
  Administered 2011-06-26: 10 meq via INTRAVENOUS

## 2011-06-26 MED ORDER — HEPARIN SODIUM (PORCINE) 1000 UNIT/ML IJ SOLN
INTRAMUSCULAR | Status: DC | PRN
  Administered 2011-06-26: 2000 [IU] via INTRAVENOUS
  Administered 2011-06-26: 27000 [IU] via INTRAVENOUS

## 2011-06-26 MED ORDER — ASPIRIN EC 325 MG PO TBEC
325.0000 mg | DELAYED_RELEASE_TABLET | Freq: Every day | ORAL | Status: DC
  Filled 2011-06-26: qty 1

## 2011-06-26 MED ORDER — ALBUMIN HUMAN 5 % IV SOLN
250.0000 mL | INTRAVENOUS | Status: AC | PRN
  Administered 2011-06-26 (×3): 250 mL via INTRAVENOUS
  Filled 2011-06-26: qty 250

## 2011-06-26 MED ORDER — ACETAMINOPHEN 650 MG RE SUPP
650.0000 mg | RECTAL | Status: AC
  Administered 2011-06-26: 650 mg via RECTAL

## 2011-06-26 MED ORDER — LACTATED RINGERS IV SOLN: INTRAVENOUS | Status: DC

## 2011-06-26 MED ORDER — TRAMADOL HCL 50 MG PO TABS
100.0000 mg | ORAL_TABLET | Freq: Four times a day (QID) | ORAL | Status: DC | PRN
  Administered 2011-06-27 (×2): 100 mg via ORAL
  Filled 2011-06-26 (×2): qty 2

## 2011-06-26 MED ORDER — DEXTROSE 5 % IV SOLN
INTRAVENOUS | Status: DC | PRN
  Administered 2011-06-26 (×2): via INTRAVENOUS

## 2011-06-26 MED ORDER — METOPROLOL TARTRATE 1 MG/ML IV SOLN: 2.5000 mg | INTRAVENOUS | Status: DC | PRN

## 2011-06-26 MED ORDER — METOPROLOL TARTRATE 12.5 MG HALF TABLET
12.5000 mg | ORAL_TABLET | Freq: Two times a day (BID) | ORAL | Status: DC
  Filled 2011-06-26 (×5): qty 1

## 2011-06-26 MED ORDER — SODIUM CHLORIDE 0.9 % IV SOLN
200.0000 ug | INTRAVENOUS | Status: DC | PRN
  Administered 2011-06-26: 0.2 ug/kg/h via INTRAVENOUS

## 2011-06-26 MED ORDER — POTASSIUM CHLORIDE 10 MEQ/50ML IV SOLN
10.0000 meq | INTRAVENOUS | Status: AC
  Administered 2011-06-26 (×3): 10 meq via INTRAVENOUS

## 2011-06-26 MED ORDER — OXYCODONE HCL 5 MG PO TABS: 5.0000 mg | ORAL_TABLET | ORAL | Status: DC | PRN

## 2011-06-26 MED ORDER — BISACODYL 5 MG PO TBEC
10.0000 mg | DELAYED_RELEASE_TABLET | Freq: Every day | ORAL | Status: DC
  Administered 2011-06-27 – 2011-06-30 (×3): 10 mg via ORAL
  Filled 2011-06-26 (×3): qty 2

## 2011-06-26 MED ORDER — NITROGLYCERIN IN D5W 200-5 MCG/ML-% IV SOLN: 0.0000 ug/min | INTRAVENOUS | Status: DC

## 2011-06-26 MED ORDER — MORPHINE SULFATE 2 MG/ML IJ SOLN
1.0000 mg | INTRAMUSCULAR | Status: AC | PRN
  Administered 2011-06-26 (×4): 1 mg via INTRAVENOUS
  Filled 2011-06-26 (×2): qty 1

## 2011-06-26 MED ORDER — SODIUM CHLORIDE 0.9 % IV SOLN
INTRAVENOUS | Status: DC
  Filled 2011-06-26: qty 1

## 2011-06-26 MED ORDER — SODIUM CHLORIDE 0.9 % IJ SOLN
3.0000 mL | Freq: Two times a day (BID) | INTRAMUSCULAR | Status: DC
  Administered 2011-06-27 – 2011-06-28 (×3): 3 mL via INTRAVENOUS

## 2011-06-26 MED ORDER — PROTAMINE SULFATE 10 MG/ML IV SOLN
INTRAVENOUS | Status: DC | PRN
  Administered 2011-06-26: 10 mg via INTRAVENOUS
  Administered 2011-06-26: 290 mg via INTRAVENOUS

## 2011-06-26 MED ORDER — DOCUSATE SODIUM 100 MG PO CAPS
200.0000 mg | ORAL_CAPSULE | Freq: Every day | ORAL | Status: DC
  Administered 2011-06-27 – 2011-06-30 (×4): 200 mg via ORAL
  Filled 2011-06-26 (×4): qty 2

## 2011-06-26 MED ORDER — PHENYLEPHRINE HCL 10 MG/ML IJ SOLN
20.0000 mg | INTRAVENOUS | Status: DC | PRN
  Administered 2011-06-26: 50 ug/min via INTRAVENOUS

## 2011-06-26 MED ORDER — 0.9 % SODIUM CHLORIDE (POUR BTL) OPTIME
TOPICAL | Status: DC | PRN
  Administered 2011-06-26: 5000 mL

## 2011-06-26 MED ORDER — DEXTROSE 5 % IV SOLN
1.5000 g | Freq: Two times a day (BID) | INTRAVENOUS | Status: AC
  Administered 2011-06-26 – 2011-06-28 (×4): 1.5 g via INTRAVENOUS
  Filled 2011-06-26 (×4): qty 1.5

## 2011-06-26 MED ORDER — DEXTROSE 5 % IV SOLN
0.0000 ug/min | INTRAVENOUS | Status: DC
  Filled 2011-06-26: qty 2

## 2011-06-26 MED ORDER — AMINOCAPROIC ACID 250 MG/ML IV SOLN: INTRAVENOUS | Status: DC | PRN

## 2011-06-26 MED ORDER — METOPROLOL TARTRATE 25 MG/10 ML ORAL SUSPENSION
12.5000 mg | Freq: Two times a day (BID) | ORAL | Status: DC
  Filled 2011-06-26: qty 5

## 2011-06-26 MED ORDER — SODIUM CHLORIDE 0.9 % IV SOLN
200.0000 ug | INTRAVENOUS | Status: DC | PRN
  Administered 2011-06-26 (×2): 30 ug via INTRAVENOUS

## 2011-06-26 MED ORDER — PANTOPRAZOLE SODIUM 40 MG PO TBEC
40.0000 mg | DELAYED_RELEASE_TABLET | Freq: Every day | ORAL | Status: DC
  Administered 2011-06-28 – 2011-06-30 (×3): 40 mg via ORAL
  Filled 2011-06-26 (×3): qty 1

## 2011-06-26 MED ORDER — FLUOXETINE HCL 20 MG PO CAPS
20.0000 mg | ORAL_CAPSULE | Freq: Every morning | ORAL | Status: DC
  Administered 2011-06-27 – 2011-06-30 (×4): 20 mg via ORAL
  Filled 2011-06-26 (×4): qty 1

## 2011-06-26 MED ORDER — MIDAZOLAM HCL 2 MG/2ML IJ SOLN: 2.0000 mg | INTRAMUSCULAR | Status: DC | PRN

## 2011-06-26 MED ORDER — METOPROLOL TARTRATE 12.5 MG HALF TABLET
12.5000 mg | ORAL_TABLET | Freq: Two times a day (BID) | ORAL | Status: DC
Start: 2011-06-26 — End: 2011-06-26
  Filled 2011-06-26: qty 1

## 2011-06-26 MED ORDER — LACTATED RINGERS IV SOLN: 500.0000 mL | Freq: Once | INTRAVENOUS | Status: AC | PRN

## 2011-06-26 MED ORDER — CALCIUM CHLORIDE 10 % IV SOLN
1.0000 g | Freq: Once | INTRAVENOUS | Status: AC
  Administered 2011-06-26: 1 g via INTRAVENOUS
  Filled 2011-06-26: qty 10

## 2011-06-26 MED ORDER — MIDAZOLAM HCL 5 MG/5ML IJ SOLN
INTRAMUSCULAR | Status: DC | PRN
  Administered 2011-06-26: 1 mg via INTRAVENOUS
  Administered 2011-06-26: 2 mg via INTRAVENOUS
  Administered 2011-06-26: 1 mg via INTRAVENOUS
  Administered 2011-06-26 (×4): 2 mg via INTRAVENOUS

## 2011-06-26 MED ORDER — INSULIN REGULAR BOLUS VIA INFUSION
0.0000 [IU] | Freq: Three times a day (TID) | INTRAVENOUS | Status: DC
  Filled 2011-06-26: qty 10

## 2011-06-26 MED ORDER — ACETAMINOPHEN 500 MG PO TABS: 1000.0000 mg | ORAL_TABLET | Freq: Four times a day (QID) | ORAL | Status: DC

## 2011-06-26 MED ORDER — SODIUM CHLORIDE 0.9 % IV SOLN
INTRAVENOUS | Status: DC | PRN
  Administered 2011-06-26: 08:00:00 via INTRAVENOUS

## 2011-06-26 MED ORDER — SODIUM CHLORIDE 0.9 % IV SOLN: 250.0000 mL | INTRAVENOUS | Status: DC

## 2011-06-26 MED ORDER — SODIUM CHLORIDE 0.9 % IV SOLN: INTRAVENOUS | Status: DC

## 2011-06-26 MED ORDER — BISACODYL 10 MG RE SUPP: 10.0000 mg | Freq: Every day | RECTAL | Status: DC

## 2011-06-26 MED ORDER — SODIUM CHLORIDE 0.45 % IV SOLN: INTRAVENOUS | Status: DC

## 2011-06-26 MED ORDER — TRAMADOL HCL 50 MG PO TABS
50.0000 mg | ORAL_TABLET | Freq: Four times a day (QID) | ORAL | Status: DC | PRN
  Administered 2011-06-28: 50 mg via ORAL
  Filled 2011-06-26: qty 1

## 2011-06-26 MED ORDER — FAMOTIDINE IN NACL 20-0.9 MG/50ML-% IV SOLN
20.0000 mg | Freq: Two times a day (BID) | INTRAVENOUS | Status: DC
  Administered 2011-06-26: 20 mg via INTRAVENOUS

## 2011-06-26 MED ORDER — EPHEDRINE SULFATE 50 MG/ML IJ SOLN
INTRAMUSCULAR | Status: DC | PRN
  Administered 2011-06-26: 10 mg via INTRAVENOUS

## 2011-06-26 MED ORDER — ASPIRIN 81 MG PO CHEW: 324.0000 mg | CHEWABLE_TABLET | Freq: Every day | ORAL | Status: DC

## 2011-06-26 MED ORDER — ACETAMINOPHEN 160 MG/5ML PO SOLN
975.0000 mg | Freq: Four times a day (QID) | ORAL | Status: DC
  Filled 2011-06-26: qty 40.6

## 2011-06-26 MED ORDER — ACETAMINOPHEN 160 MG/5ML PO SOLN: 650.0000 mg | ORAL | Status: AC

## 2011-06-26 MED ORDER — ROCURONIUM BROMIDE 100 MG/10ML IV SOLN
INTRAVENOUS | Status: DC | PRN
  Administered 2011-06-26: 10 mg via INTRAVENOUS
  Administered 2011-06-26: 20 mg via INTRAVENOUS
  Administered 2011-06-26 (×2): 50 mg via INTRAVENOUS

## 2011-06-26 MED ORDER — ACETAMINOPHEN 500 MG PO TABS
1000.0000 mg | ORAL_TABLET | Freq: Four times a day (QID) | ORAL | Status: DC
  Administered 2011-06-27 – 2011-06-30 (×10): 1000 mg via ORAL
  Filled 2011-06-26 (×19): qty 2

## 2011-06-26 MED ORDER — SODIUM CHLORIDE 0.9 % IJ SOLN
OROMUCOSAL | Status: DC | PRN
  Administered 2011-06-26: 10:00:00 via TOPICAL

## 2011-06-26 MED ORDER — PROPOFOL 10 MG/ML IV EMUL
INTRAVENOUS | Status: DC | PRN
  Administered 2011-06-26: 30 mg via INTRAVENOUS
  Administered 2011-06-26: 50 mg via INTRAVENOUS

## 2011-06-26 MED ORDER — HYDROCORTISONE SOD SUCCINATE 100 MG IJ SOLR
100.0000 mg | Freq: Four times a day (QID) | INTRAMUSCULAR | Status: DC
  Administered 2011-06-26 – 2011-06-27 (×3): 100 mg via INTRAVENOUS
  Filled 2011-06-26 (×7): qty 2

## 2011-06-26 MED ORDER — SODIUM CHLORIDE 0.9 % IV SOLN
0.1000 ug/kg/h | INTRAVENOUS | Status: DC
  Administered 2011-06-26: 0.2 ug/kg/h via INTRAVENOUS
  Filled 2011-06-26: qty 2

## 2011-06-26 MED ORDER — FENTANYL CITRATE 0.05 MG/ML IJ SOLN
INTRAMUSCULAR | Status: DC | PRN
  Administered 2011-06-26: 250 ug via INTRAVENOUS
  Administered 2011-06-26: 50 ug via INTRAVENOUS
  Administered 2011-06-26: 250 ug via INTRAVENOUS
  Administered 2011-06-26: 100 ug via INTRAVENOUS
  Administered 2011-06-26: 50 ug via INTRAVENOUS
  Administered 2011-06-26 (×2): 250 ug via INTRAVENOUS
  Administered 2011-06-26: 50 ug via INTRAVENOUS
  Administered 2011-06-26: 250 ug via INTRAVENOUS

## 2011-06-26 MED ORDER — MORPHINE SULFATE 4 MG/ML IJ SOLN
2.0000 mg | INTRAMUSCULAR | Status: DC | PRN
  Administered 2011-06-27: 2 mg via INTRAVENOUS
  Filled 2011-06-26: qty 1

## 2011-06-26 MED ORDER — EZETIMIBE 10 MG PO TABS
10.0000 mg | ORAL_TABLET | ORAL | Status: DC
  Administered 2011-06-29: 10 mg via ORAL
  Filled 2011-06-26 (×2): qty 1

## 2011-06-26 MED ORDER — LACTATED RINGERS IV SOLN
INTRAVENOUS | Status: DC | PRN
  Administered 2011-06-26: 07:00:00 via INTRAVENOUS

## 2011-06-26 MED FILL — Heparin Sodium (Porcine) Inj 1000 Unit/ML: INTRAMUSCULAR | Qty: 10 | Status: AC

## 2011-06-26 MED FILL — Lactated Ringer's Solution: INTRAVENOUS | Qty: 500 | Status: AC

## 2011-06-26 MED FILL — Nitroglycerin IV Soln 5 MG/ML: INTRAVENOUS | Qty: 10 | Status: AC

## 2011-06-26 MED FILL — Potassium Chloride Inj 2 mEq/ML: INTRAVENOUS | Qty: 40 | Status: AC

## 2011-06-26 MED FILL — Magnesium Sulfate Inj 50%: INTRAMUSCULAR | Qty: 10 | Status: AC

## 2011-06-26 MED FILL — Verapamil HCl IV Soln 2.5 MG/ML: INTRAVENOUS | Qty: 4 | Status: AC

## 2011-06-26 SURGICAL SUPPLY — 96 items
ADAPTER CARDIO PERF ANTE/RETRO (ADAPTER) IMPLANT
ADPR PRFSN 84XANTGRD RTRGD (ADAPTER)
APPLIER CLIP 9.375 SM OPEN (CLIP) ×2
APR CLP SM 9.3 20 MLT OPN (CLIP) ×1
ATTRACTOMAT 16X20 MAGNETIC DRP (DRAPES) ×2 IMPLANT
BAG DECANTER FOR FLEXI CONT (MISCELLANEOUS) ×2 IMPLANT
BANDAGE ELASTIC 4 VELCRO ST LF (GAUZE/BANDAGES/DRESSINGS) ×2 IMPLANT
BANDAGE ELASTIC 6 VELCRO ST LF (GAUZE/BANDAGES/DRESSINGS) ×2 IMPLANT
BANDAGE GAUZE ELAST BULKY 4 IN (GAUZE/BANDAGES/DRESSINGS) ×2 IMPLANT
BASKET HEART (ORDER IN 25'S) (MISCELLANEOUS) ×1
BASKET HEART (ORDER IN 25S) (MISCELLANEOUS) ×1 IMPLANT
BLADE SAW STERNAL (BLADE) ×2 IMPLANT
BLADE SURG 11 STRL SS (BLADE) ×2 IMPLANT
CANISTER SUCTION 2500CC (MISCELLANEOUS) ×2 IMPLANT
CANN PRFSN .5XCNCT 15X34-48 (MISCELLANEOUS) ×1
CANNULA GUNDRY RCSP 15FR (MISCELLANEOUS) IMPLANT
CANNULA PRFSN .5XCNCT 15X34-48 (MISCELLANEOUS) ×1 IMPLANT
CANNULA VEN 2 STAGE (MISCELLANEOUS) ×2
CATH CPB KIT HENDRICKSON (MISCELLANEOUS) ×2 IMPLANT
CATH ROBINSON RED A/P 18FR (CATHETERS) ×4 IMPLANT
CATH THORACIC 36FR (CATHETERS) ×2 IMPLANT
CATH THORACIC 36FR RT ANG (CATHETERS) IMPLANT
CLIP APPLIE 9.375 SM OPEN (CLIP) ×1 IMPLANT
CLIP FOGARTY SPRING 6M (CLIP) IMPLANT
CLIP TI MEDIUM 24 (CLIP) IMPLANT
CLIP TI WIDE RED SMALL 24 (CLIP) ×4 IMPLANT
CLOTH BEACON ORANGE TIMEOUT ST (SAFETY) ×2 IMPLANT
CONN Y 3/8X3/8X3/8  BEN (MISCELLANEOUS)
CONN Y 3/8X3/8X3/8 BEN (MISCELLANEOUS) IMPLANT
COVER SURGICAL LIGHT HANDLE (MISCELLANEOUS) ×4 IMPLANT
CRADLE DONUT ADULT HEAD (MISCELLANEOUS) ×2 IMPLANT
DRAPE CARDIOVASCULAR INCISE (DRAPES) ×2
DRAPE SLUSH/WARMER DISC (DRAPES) IMPLANT
DRAPE SRG 135X102X78XABS (DRAPES) ×1 IMPLANT
DRSG COVADERM 4X14 (GAUZE/BANDAGES/DRESSINGS) ×2 IMPLANT
ELECT PAD GROUND ADT 9 (MISCELLANEOUS) IMPLANT
ELECT REM PT RETURN 9FT ADLT (ELECTROSURGICAL) ×4
ELECTRODE REM PT RTRN 9FT ADLT (ELECTROSURGICAL) ×2 IMPLANT
GLOVE BIO SURGEON STRL SZ 6 (GLOVE) ×2 IMPLANT
GLOVE BIO SURGEON STRL SZ 6.5 (GLOVE) ×4 IMPLANT
GLOVE BIO SURGEON STRL SZ7.5 (GLOVE) ×2 IMPLANT
GLOVE EUDERMIC 7 POWDERFREE (GLOVE) ×6 IMPLANT
GOWN PREVENTION PLUS XLARGE (GOWN DISPOSABLE) ×10 IMPLANT
GOWN STRL NON-REIN LRG LVL3 (GOWN DISPOSABLE) ×6 IMPLANT
HEMOSTAT POWDER SURGIFOAM 1G (HEMOSTASIS) ×6 IMPLANT
HEMOSTAT SURGICEL 2X14 (HEMOSTASIS) ×2 IMPLANT
INSERT FOGARTY XLG (MISCELLANEOUS) IMPLANT
KIT BASIN OR (CUSTOM PROCEDURE TRAY) ×2 IMPLANT
KIT PAIN CUSTOM (MISCELLANEOUS) IMPLANT
KIT ROOM TURNOVER OR (KITS) ×2 IMPLANT
KIT SUCTION CATH 14FR (SUCTIONS) ×4 IMPLANT
KIT VASOVIEW 6 PRO VH 2400 (KITS) ×2 IMPLANT
MARKER GRAFT CORONARY BYPASS (MISCELLANEOUS) ×6 IMPLANT
NS IRRIG 1000ML POUR BTL (IV SOLUTION) ×10 IMPLANT
PACK OPEN HEART (CUSTOM PROCEDURE TRAY) ×2 IMPLANT
PAD ARMBOARD 7.5X6 YLW CONV (MISCELLANEOUS) ×4 IMPLANT
PENCIL BUTTON HOLSTER BLD 10FT (ELECTRODE) ×2 IMPLANT
PUNCH AORTIC ROTATE 4.0MM (MISCELLANEOUS) IMPLANT
PUNCH AORTIC ROTATE 4.5MM 8IN (MISCELLANEOUS) ×2 IMPLANT
PUNCH AORTIC ROTATE 5MM 8IN (MISCELLANEOUS) IMPLANT
SET CARDIOPLEGIA MPS 5001102 (MISCELLANEOUS) ×2 IMPLANT
SPONGE GAUZE 4X4 12PLY (GAUZE/BANDAGES/DRESSINGS) ×8 IMPLANT
SUT MNCRL AB 4-0 PS2 18 (SUTURE) IMPLANT
SUT PROLENE 3 0 SH DA (SUTURE) ×2 IMPLANT
SUT PROLENE 4 0 RB 1 (SUTURE)
SUT PROLENE 4 0 SH DA (SUTURE) ×6 IMPLANT
SUT PROLENE 4-0 RB1 .5 CRCL 36 (SUTURE) IMPLANT
SUT PROLENE 5 0 C 1 36 (SUTURE) ×2 IMPLANT
SUT PROLENE 6 0 C 1 30 (SUTURE) ×6 IMPLANT
SUT PROLENE 7 0 BV 1 (SUTURE) ×6 IMPLANT
SUT PROLENE 7 0 BV1 MDA (SUTURE) ×2 IMPLANT
SUT PROLENE 8 0 BV175 6 (SUTURE) ×2 IMPLANT
SUT SILK  1 MH (SUTURE) ×1
SUT SILK 1 MH (SUTURE) ×1 IMPLANT
SUT STEEL 6MS V (SUTURE) ×2 IMPLANT
SUT STEEL STERNAL CCS#1 18IN (SUTURE) IMPLANT
SUT STEEL SZ 6 DBL 3X14 BALL (SUTURE) ×2 IMPLANT
SUT TEM PAC WIRE 2 0 SH (SUTURE) ×2 IMPLANT
SUT VIC AB 1 CTX 36 (SUTURE) ×4
SUT VIC AB 1 CTX36XBRD ANBCTR (SUTURE) ×2 IMPLANT
SUT VIC AB 2-0 CT1 27 (SUTURE) ×2
SUT VIC AB 2-0 CT1 TAPERPNT 27 (SUTURE) ×1 IMPLANT
SUT VIC AB 2-0 CTX 27 (SUTURE) IMPLANT
SUT VIC AB 3-0 SH 27 (SUTURE)
SUT VIC AB 3-0 SH 27X BRD (SUTURE) IMPLANT
SUT VIC AB 3-0 X1 27 (SUTURE) ×2 IMPLANT
SUT VICRYL 4-0 PS2 18IN ABS (SUTURE) IMPLANT
SUTURE E-PAK OPEN HEART (SUTURE) ×2 IMPLANT
SYSTEM SAHARA CHEST DRAIN ATS (WOUND CARE) ×2 IMPLANT
TAPE CLOTH SURG 4X10 WHT LF (GAUZE/BANDAGES/DRESSINGS) ×4 IMPLANT
TOWEL OR 17X24 6PK STRL BLUE (TOWEL DISPOSABLE) ×4 IMPLANT
TOWEL OR 17X26 10 PK STRL BLUE (TOWEL DISPOSABLE) ×6 IMPLANT
TRAY FOLEY IC TEMP SENS 14FR (CATHETERS) ×2 IMPLANT
TUBING INSUFFLATION 10FT LAP (TUBING) ×2 IMPLANT
UNDERPAD 30X30 INCONTINENT (UNDERPADS AND DIAPERS) ×2 IMPLANT
WATER STERILE IRR 1000ML POUR (IV SOLUTION) ×4 IMPLANT

## 2011-06-26 NOTE — Interval H&P Note (Signed)
History and Physical Interval Note:  06/26/2011 7:30 AM  [name]  has presented today for surgery, with the diagnosis of CAD  The various methods of treatment have been discussed with the patient and family. After consideration of risks, benefits and other options for treatment, the patient has consented to  Procedure(s) (LRB): CORONARY ARTERY BYPASS GRAFTING (CABG) (N/A) as a surgical intervention .  The patients' history has been reviewed, patient examined, no change in status, stable for surgery.  I have reviewed the patients' chart and labs.  Questions were answered to the patient's satisfaction.     Claudette Wermuth C

## 2011-06-26 NOTE — Progress Notes (Signed)
   CARE MANAGEMENT NOTE 06/26/2011  Patient:  [name], [name]   Account Number:  [id number]  Date Initiated:  06/26/2011  Documentation initiated by:  [facility]  Subjective/Objective Assessment:   CABG x3 on 06-26-11.  Lives alone - family has arranged 24 hour coverage.     Action/Plan:   PTA, PT LIVES ALONE.  SON STATES HE HAS ARRANGED 24HR CARE FOR PT AT DISCHARGE. WILL FOLLOW FOR HOME NEEDS AS PT PROGRESSES.   Anticipated DC Date:  07/03/2011   Anticipated DC Plan:  HOME W HOME HEALTH SERVICES      DC Planning Services  CM consult      Choice offered to / List presented to:             Status of service:  In process, will continue to follow Medicare Important Message given?   (If response is "NO", the following Medicare IM given date fields will be blank) Date Medicare IM given:   Date Additional Medicare IM given:    Discharge Disposition:    Per UR Regulation:  Reviewed for med. necessity/level of care/duration of stay  If discussed at Long Length of Stay Meetings, dates discussed:    Comments:    Phone #[phone]

## 2011-06-26 NOTE — H&P (Signed)
Reason for Consult:3 vessel CAD  Referring Physician: Verdis Prime, MD  [name] is an 71 y.o. female.  HPI: 71 yo with multiple cardiac risk factors who presents with cc/o chest pain. Pain not necessarily exertional. Discomfort in mid chest radiates to back, and jaw pain. SOB with 1 flight of stairs. 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  Past Medical History   Diagnosis  Date   .  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 last knee surgery dr. Berton Lan gave large dose of hydrocortisal and avoided the hypotensive side effectas of addison's disease.   [name]  PONV (postoperative nausea and vomiting)    .  Coronary artery disease    .  Angina    .  Hypertension    .  Dysrhythmia    .  Shortness of breath    .  Recurrent upper respiratory infection (URI)    .  Hypothyroidism    .  GERD (gastroesophageal reflux disease)    .  Arthritis    .  Anxiety    .  Depression     Past Surgical History   Procedure  Date   .  Back surgery    .  Tonsillectomy    .  Diagnostic laparoscopy    .  Cardiac catheterization    .  Eye surgery    .  Dilation and curettage of uterus    .  Joint replacement    .  Cholecystectomy     History reviewed. No pertinent family history.  Social History: does not have a smoking history on file. She does not have any smokeless tobacco history on file. She reports that she does not drink alcohol or use illicit drugs.  Allergies:  Allergies   Allergen  Reactions   .  Percocet (Oxycodone-Acetaminophen)      Hallucination   .  Sulfa Antibiotics  Itching     Vaginal itching    Medications:  I have reviewed the patient's current medications.  Prior to Admission:  Prescriptions prior to admission   Medication  Sig  Dispense  Refill   .  aspirin EC 81 MG tablet  Take 81 mg by mouth every morning.     [name]  doxepin (SINEQUAN) 10 MG capsule  Take 10 mg by mouth every evening.     .  ezetimibe (ZETIA) 10 MG tablet  Take 10  mg by mouth 2 (two) times a week. Tuesdays and Thursday     .  FLUoxetine (PROZAC) 20 MG capsule  Take 20 mg by mouth every morning.     .  hydrocortisone (CORTEF) 5 MG tablet  Take 10 mg by mouth daily.     .  nebivolol (BYSTOLIC) 5 MG tablet  Take 2.5 mg by mouth at bedtime.     [name]  omeprazole (PRILOSEC) 20 MG capsule  Take 20 mg by mouth every morning.     .  Pitavastatin Calcium (LIVALO) 4 MG TABS  Take 1 tablet by mouth daily.     .  valsartan (DIOVAN) 160 MG tablet  Take 160 mg by mouth at bedtime.     .  Vitamin D, Ergocalciferol, (DRISDOL) 50000 UNITS CAPS  Take 50,000 Units by mouth every 7 (seven) days. On wednesdays      No results found for this or any previous visit (from the past 48 hour(s)).  No results found.  Review of Systems  Constitutional:  Positive for malaise/fatigue.  Trouble with low BP with anesthesia due to Addison's  Respiratory: Positive for shortness of breath (walking up stairs).  Cardiovascular: Positive for chest pain and leg swelling. Negative for orthopnea and claudication.  Varicosities bilateral  Gastrointestinal: Negative.  Genitourinary: Negative.  Musculoskeletal: Positive for joint pain.  2 R TKR  1L TKR  Skin: Negative.  Neurological: Negative.  Endo/Heme/Allergies: Does not bruise/bleed easily.  All other systems reviewed and are negative.   Blood pressure 116/54, pulse 65, temperature 98.1 F (36.7 C), temperature source Oral, resp. rate 20, height 5\' 4"  (1.626 m), weight 197 lb 1.5 oz (89.4 kg), SpO2 97.00%.  Physical Exam  Vitals reviewed.  Constitutional: She is oriented to person, place, and time. She appears well-developed and well-nourished. No distress.  obese  HENT:  Head: Normocephalic and atraumatic.  Eyes: EOM are normal. Pupils are equal, round, and reactive to light.  Neck: Normal range of motion. No JVD present. No thyromegaly present.  Cardiovascular: Normal rate, regular rhythm, normal heart sounds and intact distal  pulses. Exam reveals no gallop and no friction rub.  No murmur heard.  Respiratory: Effort normal and breath sounds normal. No respiratory distress. She has no wheezes. She has no rales.  GI: Soft. There is no tenderness.  Musculoskeletal: She exhibits no edema.  Lymphadenopathy:  She has no cervical adenopathy.  Neurological: She is alert and oriented to person, place, and time. No cranial nerve deficit.  Skin: Skin is warm and dry.   Assessment/Plan:  71 yo WF with 3 vessel CAD and angina. CABG indicated for survival benefit and relief of symptoms.  I have discussed with the patient the general nature of the procedure, need for general anesthesia,and incisions to be used. I have discussed the expected hospital stay, overall recovery and short and long term outcomes. She understands the risks include but are not limited to death, stroke, MI, DVT/PE, bleeding, possible need for transfusion, infections, other organ system dysfunction including respiratory, renal, or GI complications. She understands and accepts these risks and agrees to proceed.  For OR Monday 1st case  Shuntel Fishburn C  06/23/2011, 8:46 PM

## 2011-06-26 NOTE — Anesthesia Procedure Notes (Addendum)
Procedure Name: Intubation Date/Time: 06/26/2011 7:55 AM Performed by: Nicholos Johns Pre-anesthesia Checklist: Emergency Drugs available, Suction available, Patient being monitored, Patient identified and Timeout performed Patient Re-evaluated:Patient Re-evaluated prior to inductionOxygen Delivery Method: Circle system utilized Preoxygenation: Pre-oxygenation with 100% oxygen Intubation Type: IV induction Ventilation: Mask ventilation with difficulty and Oral airway inserted - appropriate to patient size Laryngoscope Size: Mac and 3 Grade View: Grade I Tube type: Oral Tube size: 8.0 mm Number of attempts: 1 Airway Equipment and Method: Stylet Placement Confirmation: ETT inserted through vocal cords under direct vision,  breath sounds checked- equal and bilateral and positive ETCO2 Secured at: 21 cm Tube secured with: Tape Dental Injury: Teeth and Oropharynx as per pre-operative assessment    Performed by: Lonny Eisen, Shahidah S

## 2011-06-26 NOTE — Transfer of Care (Signed)
Immediate Anesthesia Transfer of Care Note  Patient: [name]  Procedure(s) Performed: Procedure(s) (LRB): CORONARY ARTERY BYPASS GRAFTING (CABG) (N/A)  Patient Location: PACU and SICU  Anesthesia Type: General  Level of Consciousness: sedated and Patient remains intubated per anesthesia plan  Airway & Oxygen Therapy: Patient remains intubated per anesthesia plan and Patient placed on Ventilator (see vital sign flow sheet for setting)  Post-op Assessment: Report given to PACU RN and Post -op Vital signs reviewed and stable  Post vital signs: Reviewed and stable  Complications: No apparent anesthesia complications

## 2011-06-26 NOTE — Anesthesia Postprocedure Evaluation (Signed)
  Anesthesia Post-op Note  Patient: [name]  Procedure(s) Performed: Procedure(s) (LRB): CORONARY ARTERY BYPASS GRAFTING (CABG) (N/A)  Patient Location: PACU and SICU  Anesthesia Type: General  Level of Consciousness: sedated  Airway and Oxygen Therapy: Patient Spontanous Breathing, Patient remains intubated per anesthesia plan and Patient placed on Ventilator (see vital sign flow sheet for setting)  Post-op Pain: none  Post-op Assessment: Post-op Vital signs reviewed, Patient's Cardiovascular Status Stable, Respiratory Function Stable, Patent Airway, No signs of Nausea or vomiting and Pain level controlled  Post-op Vital Signs: Reviewed and stable, remains ventilated  Complications: No apparent anesthesia complications

## 2011-06-26 NOTE — Preoperative (Signed)
Beta Blockers   Reason not to administer Beta Blockers:Hold  beta blocker due to Bradycardia (HR less than 50 bpm) 

## 2011-06-26 NOTE — Procedures (Signed)
Extubation Procedure Note  Patient Details:   Name: [name] DOB: 1940-08-18 MRN: [id number]   Airway Documentation:    Pt extubated to 4L [place] at 1855. No stridor noted. BBS dim. Pt able to vocalize, NIF-20, VC 700. Positive cuff leak  Evaluation  O2 sats: stable throughout and currently acceptable Complications: No apparent complications Patient did tolerate procedure well. Bilateral Breath Sounds: Diminished     Christie Beckers 06/26/2011, 6:59 PM

## 2011-06-26 NOTE — Brief Op Note (Addendum)
                   [address]            [place] [address]          [phone]    06/23/2011 - 06/26/2011  10:50 AM  PATIENT:  [name]  71 y.o. female  PRE-OPERATIVE DIAGNOSIS:  CAD  POST-OPERATIVE DIAGNOSIS:  Same  PROCEDURE:  Procedure(s): CORONARY ARTERY BYPASS GRAFTING (CABG)x3(SVG-OM; SVG-RCA; LIMA-LAD) SURGEON:  Surgeon(s): Loreli Slot, MD  PHYSICIAN ASSISTANT: WAYNE GOLD PA-C  ANESTHESIA:   general  PATIENT CONDITION:  ICU - intubated and hemodynamically stable.  PRE-OPERATIVE WEIGHT: 89kg  COMPLICATIONS: NO KNOWN    XC 53 min CPB 88 min  Good targets and conduits

## 2011-06-26 NOTE — Progress Notes (Signed)
UR Completed.  Kizzi Overbey Jane [phone] 06/26/2011  

## 2011-06-26 NOTE — Anesthesia Preprocedure Evaluation (Addendum)
Anesthesia Evaluation  Patient identified by MRN, date of birth, ID band Patient awake    Reviewed: Allergy & Precautions, H&P , NPO status , Patient's Chart, lab work & pertinent test results, reviewed documented beta blocker date and time   History of Anesthesia Complications (+) PONV  Airway Mallampati: II TM Distance: >3 FB Neck ROM: Full  Mouth opening: Limited Mouth Opening  Dental No notable dental hx. (+) Teeth Intact and Dental Advisory Given   Pulmonary neg pulmonary ROS, shortness of breath, Recent URI , Resolved,  breath sounds clear to auscultation  Pulmonary exam normal       Cardiovascular hypertension (held this am for HR<60), Pt. on medications and Pt. on home beta blockers + angina + CAD (cath: 90% LAD, 90% cx, 70% RCA, normal LVF) + dysrhythmias Rhythm:Regular Rate:Normal     Neuro/Psych Anxiety Depression negative neurological ROS     GI/Hepatic Neg liver ROS, GERD-  Medicated and Controlled,  Endo/Other  Hypothyroidism (on replacement) Morbid obesity? Addison's disease- treated with prednisone  Renal/GU negative Renal ROS     Musculoskeletal  (+) Arthritis -, Osteoarthritis,    Abdominal (+) + obese,   Peds  Hematology negative hematology ROS (+)   Anesthesia Other Findings   Reproductive/Obstetrics                        Anesthesia Physical Anesthesia Plan  ASA: III  Anesthesia Plan: General   Post-op Pain Management:    Induction: Intravenous  Airway Management Planned: Oral ETT  Additional Equipment: Arterial line, CVP and PA Cath  Intra-op Plan:   Post-operative Plan: Post-operative intubation/ventilation  Informed Consent: I have reviewed the patients History and Physical, chart, labs and discussed the procedure including the risks, benefits and alternatives for the proposed anesthesia with the patient or authorized representative who has indicated his/her  understanding and acceptance.   Dental advisory given  Plan Discussed with: CRNA and Surgeon  Anesthesia Plan Comments: (Plan routine monitors, A line, PA cath, GETA with post op analgesia)       Anesthesia Quick Evaluation

## 2011-06-26 NOTE — Op Note (Signed)
[name], ATILANO              ACCOUNT NO.:  [id number]  MEDICAL RECORD NO.:  [id number]  LOCATION:  2302                         FACILITY:  [facility]  PHYSICIAN:  Salvatore Decent. Dorris Fetch, M.D.DATE OF BIRTH:  21-Jan-1941  DATE OF PROCEDURE:  06/26/2011 DATE OF DISCHARGE:                              OPERATIVE REPORT   PREOPERATIVE DIAGNOSES:  Severe three-vessel coronary artery disease with unstable angina.  POSTOPERATIVE DIAGNOSES:  Severe three-vessel coronary artery disease with unstable angina.  PROCEDURE:  Median sternotomy, extracorporeal circulation, coronary artery bypass grafting x3 (left internal mammary artery to LAD, saphenous vein graft to obtuse marginal, saphenous vein graft to distal right coronary).  SURGEON:  Salvatore Decent. Dorris Fetch, M.D.  ASSISTANT:  Rowe Clack, PA-C.  ANESTHESIA:  General.  FINDINGS:  Good quality targets, good quality conduits, weaned from bypass without difficulty.  CLINICAL NOTE:  [name] is a 71 year old woman who presented with progressive angina.  At cardiac catheterization, she was found to have severe three-vessel coronary disease.  She was advised to undergo coronary artery bypass grafting.  The indications, risks, benefits, and alternatives were discussed in detail with the patient.  She understood and accepted the risks and agreed to proceed.  OPERATIVE NOTE:  [name] was brought to the preop holding area on June 26, 2011.  There, the Anesthesia Service placed Swan-Ganz catheter and arterial blood pressure monitoring line.  Intravenous steroids and intravenous antibiotics were administered.  She was taken to the operating room, anesthetized, and intubated.  A Foley catheter was placed.  The chest, abdomen, and legs were prepped and draped in usual sterile fashion.  An incision was made in the medial aspect of the right leg at the level of the knee.  Greater saphenous vein was identified and was harvested from the  right thigh endoscopically, it was a good quality vessel. Simultaneously, a median sternotomy was performed and the left internal mammary artery was harvested using standard technique.  2000 units of heparin was administered during the vessel harvest.  After harvesting the conduits, the remainder of full heparin dose was given.  The pericardium was opened.  The ascending aorta was inspected. There was no palpable atherosclerotic disease.  The aorta was cannulated via concentric 2-0 Ethibond pledgeted pursestring sutures.  A dual-stage venous cannula was placed via pursestring suture in the right atrial appendage.  Cardiopulmonary bypass was instituted and the patient was cooled to 32 degrees Celsius.  Flows were maintained per protocol. Anticoagulation was monitored with ACT measurement.  The coronary arteries were inspected and anastomotic sites were chosen.  The conduits were inspected and cut to length.  A foam pad was placed in the pericardium to insulate the heart and protect the left phrenic nerve.  A temperature probe was placed in myocardial septum and a cardioplegic cannula was placed in the ascending aorta.  The aorta was crossclamped.  The left ventricle was emptied via aortic root vent.  Cardiac arrest then was achieved with combination of cold antegrade blood cardioplegia and topical iced saline.  1 L of cardioplegia was administered.  Myocardial septal temperature fell to 9 degrees Celsius.  The following distal anastomoses were performed.  First, a reversed saphenous vein  graft was placed end-to-side to the distal right coronary.  This was grafted just beyond its bifurcation in the acute marginal branch.  It was a 1.5-mm, good quality target.  The vein was anastomosed end-to-side with a running 7 Prolene suture.  There was good flow through the graft and good hemostasis with cardioplegia administration.  Next, a reversed saphenous vein graft was placed end-to-side to  the obtuse marginal.  This was a large bifurcating vessel, had a tight stenosis proximally.  The vessel was of good quality at the site of the anastomosis.  The vein graft was anastomosed end-to-side with running 7 Prolene suture.  Again, there was good flow through the graft and good hemostasis.  Next, the left internal mammary artery was brought through a window in the pericardium.  The distal end was beveled.  It was then anastomosed end-to-side to the distal LAD.  Both the mammary and LAD were 2 mm, good quality target vessels.  The anastomosis was performed with a running 8- 0 Prolene suture in an end-to-side fashion.  At the completion of the mammary to LAD anastomosis, the bulldog clamps were removed.  Immediate and rapid septal rewarming was noted.  The bulldog clamp was replaced and the mammary pedicle was tacked to the epicardial surface of the heart with 6-0 Prolene sutures.  Additional cardioplegia was administered.  The vein grafts were cut to length.  The proximal vein graft anastomoses then were performed to 4.5 mm punch aortotomies with running 6-0 Prolene sutures.  At completion of final proximal anastomosis, the patient was placed in Trendelenburg position.  Lidocaine was administered.  The bulldog clamp was again removed from the left mammary artery.  The aortic root was de-aired and the aortic crossclamp was removed.  Total crossclamp time was 53 minutes.  The patient required a single defibrillation with 20 joules then was in sinus rhythm thereafter.  While rewarming was completed, all proximal and distal anastomoses were inspected for hemostasis.  Epicardial pacing wires were placed on the right ventricle and right atrium.  When the patient was rewarmed to a core temperature of 37 degrees Celsius, she was weaned from cardiopulmonary bypass on the first attempt without difficulty.  The total bypass time was 88 minutes.  Initial cardiac index was greater than 2  L/minute/meter squared.  She then remained hemodynamically stable throughout the post-bypass period.  A test dose of protamine was administered and was well tolerated.  The atrial and aortic cannulae were removed.  The remainder of the protamine was administered without incident.  The chest was irrigated with warm saline.  Hemostasis was achieved.  A left pleural and single mediastinal chest tubes were placed through a separate subcostal incisions.  The pericardium was closed with interrupted 3-0 silk sutures.  It came together easily without tension.  The sternum was closed with combination of single and double heavy-gauge stainless steel wires.  The pectoralis fascia, subcutaneous tissue, and skin were closed in standard fashion. All sponge, needle, instrument counts were correct at the end of the procedure.  The patient was taken from the operating room to the Surgical Intensive Care Unit in good condition.     Salvatore Decent Dorris Fetch, M.D.     SCH/MEDQ  D:  06/26/2011  T:  06/26/2011  Job:  [id number]

## 2011-06-26 NOTE — Plan of Care (Signed)
Problem: Phase II Progression Outcomes Goal: Patient extubated within - Outcome: Completed/Met Date Met:  06/26/11 6-12 hours

## 2011-06-26 NOTE — Progress Notes (Signed)
Patient ID: [name], female   DOB: Dec 24, 1940, 72 y.o.   MRN: [id number]   Filed Vitals:   06/26/11 1730 06/26/11 1745 06/26/11 1800 06/26/11 1859  BP: 85/56  92/59 141/86  Pulse: 91 91 91 91  Temp: 99.1 F (37.3 C) 99.1 F (37.3 C) 99.1 F (37.3 C) 99.3 F (37.4 C)  TempSrc:      Resp: 15 12 15 12   Height:      Weight:      SpO2: 96% 97% 98% 98%   CI=2.4 Extubated Urine output good  CT output low  CBC    Component Value Date/Time   WBC 14.1* 06/26/2011 1242   RBC 3.52* 06/26/2011 1242   HGB 9.2* 06/26/2011 1243   HCT 27.0* 06/26/2011 1243   PLT 140* 06/26/2011 1242   MCV 89.2 06/26/2011 1242   MCH 30.4 06/26/2011 1242   MCHC 34.1 06/26/2011 1242   RDW 12.5 06/26/2011 1242   LYMPHSABS 2.8 09/13/2010 1220   MONOABS 1.9* 09/13/2010 1220   EOSABS 0.0 09/13/2010 1220   BASOSABS 0.0 09/13/2010 1220    BMET    Component Value Date/Time   NA 139 06/26/2011 1243   K 3.6 06/26/2011 1243   CL 109 06/25/2011 0630   CO2 26 06/25/2011 0630   GLUCOSE 142* 06/26/2011 1243   BUN 11 06/25/2011 0630   CREATININE 0.70 06/25/2011 0630   CALCIUM 8.7 06/25/2011 0630   GFRNONAA 85* 06/25/2011 0630   GFRAA >90 06/25/2011 0630    A/P: Stable postop course.

## 2011-06-27 ENCOUNTER — Inpatient Hospital Stay (HOSPITAL_COMMUNITY): Payer: Medicare Other

## 2011-06-27 ENCOUNTER — Encounter (HOSPITAL_COMMUNITY): Payer: Self-pay | Admitting: Thoracic Surgery (Cardiothoracic Vascular Surgery)

## 2011-06-27 LAB — GLUCOSE, CAPILLARY
Glucose-Capillary: 149 mg/dL — ABNORMAL HIGH (ref 70–99)
Glucose-Capillary: 150 mg/dL — ABNORMAL HIGH (ref 70–99)
Glucose-Capillary: 151 mg/dL — ABNORMAL HIGH (ref 70–99)
Glucose-Capillary: 177 mg/dL — ABNORMAL HIGH (ref 70–99)

## 2011-06-27 LAB — CBC
HCT: 27.2 % — ABNORMAL LOW (ref 36.0–46.0)
Hemoglobin: 10 g/dL — ABNORMAL LOW (ref 12.0–15.0)
Hemoglobin: 9.1 g/dL — ABNORMAL LOW (ref 12.0–15.0)
MCH: 31.2 pg (ref 26.0–34.0)
MCH: 31.4 pg (ref 26.0–34.0)
MCHC: 34.4 g/dL (ref 30.0–36.0)
MCHC: 34 g/dL (ref 30.0–36.0)
MCV: 91.3 fL (ref 78.0–100.0)
Platelets: 154 10*3/uL (ref 150–400)
Platelets: 135 10*3/uL — ABNORMAL LOW (ref 150–400)
RBC: 2.98 MIL/uL — ABNORMAL LOW (ref 3.87–5.11)
RDW: 12.7 % (ref 11.5–15.5)
RDW: 12.8 % (ref 11.5–15.5)
RDW: 12.9 % (ref 11.5–15.5)
WBC: 16.8 10*3/uL — ABNORMAL HIGH (ref 4.0–10.5)

## 2011-06-27 LAB — POCT I-STAT, CHEM 8
Chloride: 99 mEq/L (ref 96–112)
Glucose, Bld: 201 mg/dL — ABNORMAL HIGH (ref 70–99)
HCT: 26 % — ABNORMAL LOW (ref 36.0–46.0)
Potassium: 3.7 mEq/L (ref 3.5–5.1)

## 2011-06-27 LAB — MAGNESIUM: Magnesium: 1.9 mg/dL (ref 1.5–2.5)

## 2011-06-27 LAB — BASIC METABOLIC PANEL
Calcium: 8.7 mg/dL (ref 8.4–10.5)
GFR calc non Af Amer: 88 mL/min — ABNORMAL LOW (ref >90)
Glucose, Bld: 150 mg/dL — ABNORMAL HIGH (ref 70–99)
Sodium: 137 mEq/L (ref 135–145)

## 2011-06-27 LAB — CREATININE, SERUM
Creatinine, Ser: 0.74 mg/dL (ref 0.50–1.10)
GFR calc Af Amer: >90 mL/min (ref >90)
GFR calc non Af Amer: 87 mL/min — ABNORMAL LOW (ref >90)
GFR calc non Af Amer: 83 mL/min — ABNORMAL LOW (ref >90)

## 2011-06-27 MED ORDER — POTASSIUM CHLORIDE 10 MEQ/50ML IV SOLN
10.0000 meq | INTRAVENOUS | Status: AC | PRN
  Administered 2011-06-27 – 2011-06-28 (×3): 10 meq via INTRAVENOUS
  Filled 2011-06-27: qty 50

## 2011-06-27 MED ORDER — INSULIN ASPART 100 UNIT/ML ~~LOC~~ SOLN: 0.0000 [IU] | SUBCUTANEOUS | Status: DC

## 2011-06-27 MED ORDER — INSULIN GLARGINE 100 UNIT/ML ~~LOC~~ SOLN
40.0000 [IU] | Freq: Every day | SUBCUTANEOUS | Status: DC
  Administered 2011-06-27: 40 [IU] via SUBCUTANEOUS

## 2011-06-27 MED ORDER — POTASSIUM CHLORIDE 10 MEQ/50ML IV SOLN
10.0000 meq | INTRAVENOUS | Status: AC
  Administered 2011-06-27 (×2): 10 meq via INTRAVENOUS
  Filled 2011-06-27 (×2): qty 50

## 2011-06-27 MED ORDER — FUROSEMIDE 10 MG/ML IJ SOLN
40.0000 mg | Freq: Once | INTRAMUSCULAR | Status: AC
  Administered 2011-06-27: 40 mg via INTRAVENOUS
  Filled 2011-06-27: qty 4

## 2011-06-27 MED ORDER — ASPIRIN EC 81 MG PO TBEC
81.0000 mg | DELAYED_RELEASE_TABLET | Freq: Every day | ORAL | Status: DC
  Administered 2011-06-27 – 2011-06-30 (×4): 81 mg via ORAL
  Filled 2011-06-27 (×4): qty 1

## 2011-06-27 MED ORDER — DOXEPIN HCL 10 MG PO CAPS
10.0000 mg | ORAL_CAPSULE | Freq: Every day | ORAL | Status: DC
  Administered 2011-06-27 – 2011-06-29 (×3): 10 mg via ORAL
  Filled 2011-06-27 (×4): qty 1

## 2011-06-27 MED ORDER — ENOXAPARIN SODIUM 40 MG/0.4ML ~~LOC~~ SOLN
40.0000 mg | Freq: Every day | SUBCUTANEOUS | Status: DC
  Administered 2011-06-27 – 2011-06-29 (×3): 40 mg via SUBCUTANEOUS
  Filled 2011-06-27 (×4): qty 0.4

## 2011-06-27 MED ORDER — INSULIN ASPART 100 UNIT/ML ~~LOC~~ SOLN
0.0000 [IU] | SUBCUTANEOUS | Status: DC
  Administered 2011-06-27 (×3): 2 [IU] via SUBCUTANEOUS
  Administered 2011-06-27: 4 [IU] via SUBCUTANEOUS
  Administered 2011-06-28 (×2): 2 [IU] via SUBCUTANEOUS

## 2011-06-27 MED ORDER — INSULIN ASPART 100 UNIT/ML ~~LOC~~ SOLN
4.0000 [IU] | Freq: Three times a day (TID) | SUBCUTANEOUS | Status: DC
  Administered 2011-06-27 (×2): 4 [IU] via SUBCUTANEOUS

## 2011-06-27 MED ORDER — HYDROCORTISONE SOD SUCCINATE 100 MG IJ SOLR
50.0000 mg | Freq: Four times a day (QID) | INTRAMUSCULAR | Status: DC
  Administered 2011-06-27 – 2011-06-28 (×4): 50 mg via INTRAVENOUS
  Filled 2011-06-27 (×8): qty 1

## 2011-06-27 MED ORDER — INSULIN ASPART 100 UNIT/ML ~~LOC~~ SOLN
0.0000 [IU] | SUBCUTANEOUS | Status: AC
  Administered 2011-06-27 (×3): 2 [IU] via SUBCUTANEOUS

## 2011-06-27 NOTE — Progress Notes (Signed)
1 Day Post-Op Procedure(s) (LRB): CORONARY ARTERY BYPASS GRAFTING (CABG) (N/A) Subjective: Feels well, mild incisional discomfort  Objective: Vital signs in last 24 hours: Temp:  [95.5 F (35.3 C)-99.3 F (37.4 C)] 98.6 F (37 C) (03/26 0730) Pulse Rate:  [80-91] 91  (03/26 0730) Cardiac Rhythm:  [-] A-V Sequential paced (03/26 0800) Resp:  [12-23] 14  (03/26 0730) BP: (74-141)/(47-86) 103/64 mmHg (03/26 0700) SpO2:  [93 %-100 %] 96 % (03/26 0730) Arterial Line BP: (93-149)/(47-74) 127/54 mmHg (03/26 0800) FiO2 (%):  [39.8 %-50.2 %] 39.9 % (03/25 1845) Weight:  [213 lb 10 oz (96.9 kg)] 213 lb 10 oz (96.9 kg) (03/26 0600)  Hemodynamic parameters for last 24 hours: PAP: (23-51)/(14-33) 40/22 mmHg CO:  [3 L/min-7.5 L/min] 7.5 L/min CI:  [1.6 L/min/m2-3.8 L/min/m2] 3.8 L/min/m2  Intake/Output from previous day: 03/25 0701 - 03/26 0700 In: 6131.8 [P.O.:300; I.V.:4099.8; Blood:452; NG/GT:30; IV Piggyback:1250] Out: 6115 [Urine:4180; Emesis/NG output:50; Blood:1535; Chest Tube:350] Intake/Output this shift: Total I/O In: 111.3 [P.O.:50; I.V.:61.3] Out: -   General appearance: alert and no distress Neurologic: intact Heart: regular rate and rhythm Lungs: diminished breath sounds bibasilar Abdomen: normal findings: soft, non-tender  Lab Results:  Basename 06/27/11 0330 06/26/11 2029 06/26/11 2025  WBC 19.3* -- 16.5*  HGB 10.0* 10.2* --  HCT 29.1* 30.0* --  PLT 152 -- 149*   BMET:  Basename 06/27/11 0330 06/26/11 2029 06/25/11 0630  NA 137 144 --  K 4.3 4.3 --  CL 104 106 --  CO2 24 -- 26  GLUCOSE 150* 135* --  BUN 9 8 --  CREATININE 0.62 0.70 --  CALCIUM 8.7 -- 8.7    PT/INR:  Basename 06/26/11 1242  LABPROT 17.6*  INR 1.42   ABG    Component Value Date/Time   PHART 7.274* 06/26/2011 2211   HCO3 25.5* 06/26/2011 2211   TCO2 27 06/26/2011 2211   ACIDBASEDEF 2.0 06/26/2011 2211   O2SAT 95.0 06/26/2011 2211   CBG (last 3)   Basename 06/27/11 0731 06/27/11  0550 06/27/11 0333  GLUCAP 150* 149* 131*    Assessment/Plan: S/P Procedure(s) (LRB): CORONARY ARTERY BYPASS GRAFTING (CABG) (N/A) - CV- stable, SR @ 60, d/c pacer, low dose beta blocker RESP- pulmonary toilet,IS RENAL- lytes, creatinine OK, diurese Anemia secondary to ABL - follow Leukocytosis- likely due to steroids Hyperglycemia- well controlled   LOS: 4 days    Zakeria Kulzer C 06/27/2011

## 2011-06-27 NOTE — Progress Notes (Signed)
[facility] BRIEF SICU PROGRESS NOTE  1 Day Post-Op  S/P Procedure(s) (LRB): CORONARY ARTERY BYPASS GRAFTING (CABG) (N/A)   Stable day BP stable off Neo NSR UOP adequate  Plan: Continue current plan  Sanjiv Castorena H 06/27/2011 10:28 PM

## 2011-06-27 NOTE — Progress Notes (Signed)
UR Completed.  Luz Burcher Jane [phone] 06/27/2011  

## 2011-06-28 ENCOUNTER — Inpatient Hospital Stay (HOSPITAL_COMMUNITY): Payer: Medicare Other

## 2011-06-28 LAB — CBC
MCH: 31.5 pg (ref 26.0–34.0)
MCHC: 33.9 g/dL (ref 30.0–36.0)
MCV: 92.9 fL (ref 78.0–100.0)
Platelets: 122 10*3/uL — ABNORMAL LOW (ref 150–400)
RDW: 13 % (ref 11.5–15.5)

## 2011-06-28 LAB — GLUCOSE, CAPILLARY
Glucose-Capillary: 118 mg/dL — ABNORMAL HIGH (ref 70–99)
Glucose-Capillary: 123 mg/dL — ABNORMAL HIGH (ref 70–99)

## 2011-06-28 LAB — BASIC METABOLIC PANEL
CO2: 29 mEq/L (ref 19–32)
Calcium: 8.4 mg/dL (ref 8.4–10.5)
Creatinine, Ser: 0.66 mg/dL (ref 0.50–1.10)
GFR calc non Af Amer: 86 mL/min — ABNORMAL LOW (ref >90)
Glucose, Bld: 124 mg/dL — ABNORMAL HIGH (ref 70–99)
Sodium: 138 mEq/L (ref 135–145)

## 2011-06-28 MED ORDER — SODIUM CHLORIDE 0.9 % IJ SOLN: 3.0000 mL | INTRAMUSCULAR | Status: DC | PRN

## 2011-06-28 MED ORDER — FUROSEMIDE 40 MG PO TABS
40.0000 mg | ORAL_TABLET | Freq: Every day | ORAL | Status: DC
  Administered 2011-06-28 – 2011-06-30 (×3): 40 mg via ORAL
  Filled 2011-06-28 (×3): qty 1

## 2011-06-28 MED ORDER — SODIUM CHLORIDE 0.9 % IJ SOLN
3.0000 mL | Freq: Two times a day (BID) | INTRAMUSCULAR | Status: DC
  Administered 2011-06-28 – 2011-06-29 (×3): 3 mL via INTRAVENOUS

## 2011-06-28 MED ORDER — GUAIFENESIN-DM 100-10 MG/5ML PO SYRP: 15.0000 mL | ORAL_SOLUTION | ORAL | Status: DC | PRN

## 2011-06-28 MED ORDER — ALPRAZOLAM 0.25 MG PO TABS: 0.2500 mg | ORAL_TABLET | Freq: Four times a day (QID) | ORAL | Status: DC | PRN

## 2011-06-28 MED ORDER — POTASSIUM CHLORIDE CRYS ER 20 MEQ PO TBCR
20.0000 meq | EXTENDED_RELEASE_TABLET | Freq: Every day | ORAL | Status: DC
  Administered 2011-06-28 – 2011-06-29 (×2): 20 meq via ORAL
  Filled 2011-06-28 (×3): qty 1

## 2011-06-28 MED ORDER — MOVING RIGHT ALONG BOOK
Freq: Once | Status: AC
  Administered 2011-06-28: 15:00:00
  Filled 2011-06-28: qty 1

## 2011-06-28 MED ORDER — SODIUM CHLORIDE 0.9 % IV SOLN: 250.0000 mL | INTRAVENOUS | Status: DC | PRN

## 2011-06-28 MED ORDER — HYDROCORTISONE SOD SUCCINATE 100 MG IJ SOLR
50.0000 mg | Freq: Three times a day (TID) | INTRAMUSCULAR | Status: DC
  Administered 2011-06-28 – 2011-06-29 (×3): 50 mg via INTRAVENOUS
  Filled 2011-06-28 (×6): qty 1

## 2011-06-28 MED ORDER — INSULIN ASPART 100 UNIT/ML ~~LOC~~ SOLN
0.0000 [IU] | Freq: Three times a day (TID) | SUBCUTANEOUS | Status: DC
  Administered 2011-06-28: 4 [IU] via SUBCUTANEOUS
  Administered 2011-06-29 (×2): 2 [IU] via SUBCUTANEOUS

## 2011-06-28 MED ORDER — INSULIN ASPART 100 UNIT/ML ~~LOC~~ SOLN
3.0000 [IU] | Freq: Three times a day (TID) | SUBCUTANEOUS | Status: DC
  Administered 2011-06-28 – 2011-06-30 (×4): 3 [IU] via SUBCUTANEOUS

## 2011-06-28 MED ORDER — NEBIVOLOL HCL 2.5 MG PO TABS
2.5000 mg | ORAL_TABLET | Freq: Every day | ORAL | Status: DC
  Administered 2011-06-28 – 2011-06-29 (×2): 2.5 mg via ORAL
  Filled 2011-06-28 (×3): qty 1

## 2011-06-28 MED ORDER — HYDROCORTISONE 10 MG PO TABS
10.0000 mg | ORAL_TABLET | Freq: Every day | ORAL | Status: DC
  Administered 2011-06-29 – 2011-06-30 (×2): 10 mg via ORAL
  Filled 2011-06-28 (×2): qty 1

## 2011-06-28 MED ORDER — INSULIN GLARGINE 100 UNIT/ML ~~LOC~~ SOLN
30.0000 [IU] | Freq: Every day | SUBCUTANEOUS | Status: DC
  Administered 2011-06-28 – 2011-06-30 (×3): 30 [IU] via SUBCUTANEOUS

## 2011-06-28 MED ORDER — ZOLPIDEM TARTRATE 5 MG PO TABS: 5.0000 mg | ORAL_TABLET | Freq: Every evening | ORAL | Status: DC | PRN

## 2011-06-28 NOTE — Progress Notes (Signed)
CARDIAC REHAB PHASE I   PRE:  Rate/Rhythm: 66SR  BP:  Supine: 107/50  Sitting:   Standing:    SaO2: 93%RA  MODE:  Ambulation: 200 ft   POST:  Rate/Rhythem: 76  BP:  Supine: 144/57  Sitting:   Standing:    SaO2: 90-94%RA 1425-1450 Pt walked 200 ft on RA with rolling walker and asst x 1. Tolerated well. Did not feel as if she could go farther due to knees weak. Back to bed. Call light in reach.  [name]

## 2011-06-28 NOTE — Progress Notes (Signed)
Pt ambulated 550 ft with walker. Pt tolerated walk well. O2 Sats remained greater than or equal to 90%. Pt returned to bed. V/S stable and call bell within reach. [name] 9:10 PM 06/28/2011

## 2011-06-28 NOTE — Progress Notes (Signed)
The patient is status post coronary bypass surgery and doing well at this time. No arrhythmias thus far in her postoperative course.

## 2011-06-28 NOTE — Progress Notes (Signed)
Inpatient Diabetes Program Recommendations  AACE/ADA: New Consensus Statement on Inpatient Glycemic Control (2009)  Target Ranges:  Prepandial:   less than 140 mg/dL      Peak postprandial:   less than 180 mg/dL (1-2 hours)      Critically ill patients:  140 - 180 mg/dL   Reason for Visit: Results for [name], [name] (MRN [id number]) as of 06/28/2011 10:31  Ref. Range 06/27/2011 05:50 06/27/2011 07:31 06/27/2011 12:07 06/27/2011 15:46 06/27/2011 19:24  Glucose-Capillary Latest Range: 70-99 mg/dL 409 (H) 811 (H) 914 (H) 151 (H) 177 (H)   No history of diabetes noted.  Patient is on IV Solucortef.  Inpatient Diabetes Program Recommendations Correction (SSI): Please restart Novolog correction sensitive tid wc. HgbA1C: Check A1C to determine prehospitalization glycemic control.  Note: Will follow.

## 2011-06-28 NOTE — Progress Notes (Signed)
2 Days Post-Op Procedure(s) (LRB): CORONARY ARTERY BYPASS GRAFTING (CABG) (N/A) Subjective: Feels well. Some mild incisional pain. No nausea  Objective: Vital signs in last 24 hours: Temp:  [97.9 F (36.6 C)-99.3 F (37.4 C)] 97.9 F (36.6 C) (03/27 0737) Pulse Rate:  [55-107] 56  (03/27 0700) Cardiac Rhythm:  [-] Normal sinus rhythm (03/27 0400) Resp:  [14-23] 15  (03/27 0700) BP: (88-120)/(39-59) 109/51 mmHg (03/27 0700) SpO2:  [88 %-100 %] 95 % (03/27 0700) Arterial Line BP: (114-156)/(38-57) 142/44 mmHg (03/26 1900) Weight:  [214 lb 4.6 oz (97.2 kg)] 214 lb 4.6 oz (97.2 kg) (03/27 0600)  Hemodynamic parameters for last 24 hours: PAP: (27-48)/(18-28) 29/19 mmHg  Intake/Output from previous day: 03/26 0701 - 03/27 0700 In: 2027.9 [P.O.:1200; I.V.:527.9; IV Piggyback:300] Out: 2330 [Urine:2330] Intake/Output this shift:    General appearance: alert and no distress Neurologic: intact Heart: regular rate and rhythm Lungs: diminished breath sounds bibasilar Abdomen: normal findings: soft, non-tender  Lab Results:  Basename 06/28/11 0445 06/27/11 1829 06/27/11 1700  WBC 13.5* -- 16.7*  HGB 8.4* 8.8* --  HCT 24.8* 26.0* --  PLT 122* -- 135*   BMET:  Basename 06/28/11 0445 06/27/11 1829 06/27/11 0330  NA 138 137 --  K 4.3 3.7 --  CL 104 99 --  CO2 29 -- 24  GLUCOSE 124* 201* --  BUN 11 11 --  CREATININE 0.66 0.70 --  CALCIUM 8.4 -- 8.7    PT/INR:  Basename 06/26/11 1242  LABPROT 17.6*  INR 1.42   ABG    Component Value Date/Time   PHART 7.274* 06/26/2011 2211   HCO3 25.5* 06/26/2011 2211   TCO2 24 06/27/2011 1829   ACIDBASEDEF 2.0 06/26/2011 2211   O2SAT 95.0 06/26/2011 2211   CBG (last 3)   Basename 06/27/11 2340 06/27/11 1924 06/27/11 1546  GLUCAP 123* 177* 151*    Assessment/Plan: S/P Procedure(s) (LRB): CORONARY ARTERY BYPASS GRAFTING (CABG) (N/A) Plan for transfer to step-down: see transfer orders CV- stable, relatively bradycardic which is her  baseline RESP -bibasilar platelike atelectasis on CXR- IS RENAL- lytes, creatinine OK- continue diuresis Leukocytosis- improving, likely secondary to stress steroids Addison's- steroids Ambulate   LOS: 5 days    Braylee Bosher C 06/28/2011

## 2011-06-29 ENCOUNTER — Encounter (HOSPITAL_COMMUNITY): Payer: Self-pay | Admitting: Dietician

## 2011-06-29 LAB — CBC
MCH: 31 pg (ref 26.0–34.0)
MCHC: 32.8 g/dL (ref 30.0–36.0)
RDW: 12.9 % (ref 11.5–15.5)

## 2011-06-29 LAB — BASIC METABOLIC PANEL
BUN: 17 mg/dL (ref 6–23)
Calcium: 8.8 mg/dL (ref 8.4–10.5)
GFR calc Af Amer: >90 mL/min (ref >90)
GFR calc non Af Amer: 85 mL/min — ABNORMAL LOW (ref >90)
Glucose, Bld: 109 mg/dL — ABNORMAL HIGH (ref 70–99)
Potassium: 4.7 mEq/L (ref 3.5–5.1)
Sodium: 145 mEq/L (ref 135–145)

## 2011-06-29 LAB — TYPE AND SCREEN
ABO/RH(D): B POS
Antibody Screen: NEGATIVE
Unit division: 0
Unit division: 0
Unit division: 0

## 2011-06-29 LAB — GLUCOSE, CAPILLARY
Glucose-Capillary: 136 mg/dL — ABNORMAL HIGH (ref 70–99)
Glucose-Capillary: 119 mg/dL — ABNORMAL HIGH (ref 70–99)

## 2011-06-29 MED ORDER — TRAMADOL HCL 50 MG PO TABS: 100.0000 mg | ORAL_TABLET | Freq: Four times a day (QID) | ORAL | Status: DC | PRN

## 2011-06-29 MED ORDER — POTASSIUM CHLORIDE CRYS ER 20 MEQ PO TBCR: 20.0000 meq | EXTENDED_RELEASE_TABLET | Freq: Every day | ORAL | Status: DC

## 2011-06-29 MED ORDER — FUROSEMIDE 40 MG PO TABS: 40.0000 mg | ORAL_TABLET | Freq: Every day | ORAL | Status: DC

## 2011-06-29 NOTE — Progress Notes (Signed)
   CARE MANAGEMENT NOTE 06/29/2011  Patient:  [name], [name]   Account Number:  [id number]  Date Initiated:  06/26/2011  Documentation initiated by:  [facility]  Subjective/Objective Assessment:   CABG x3 on 06-26-11.  Lives alone - family has arranged 24 hour coverage.     Action/Plan:   PTA, PT LIVES ALONE.  SON STATES HE HAS ARRANGED 24HR CARE FOR PT AT DISCHARGE. WILL FOLLOW FOR HOME NEEDS AS PT PROGRESSES.   Anticipated DC Date:  07/03/2011   Anticipated DC Plan:  HOME W HOME HEALTH SERVICES      DC Planning Services  CM consult      Choice offered to / List presented to:     DME arranged  WALKER - ROLLING  TUB BENCH  BEDSIDE COMMODE  OTHER - SEE COMMENT        HH arranged  HH-4 NURSE'S AIDE      HH agency  [facility]   Status of service:  In process, will continue to follow Medicare Important Message given?   (If response is "NO", the following Medicare IM given date fields will be blank) Date Medicare IM given:   Date Additional Medicare IM given:    Discharge Disposition:    Per UR Regulation:  Reviewed for med. necessity/level of care/duration of stay  If discussed at Long Length of Stay Meetings, dates discussed:    Comments:  06/29/11 Abigail Teuscher,[name],[name] [age] CONFIRMED 24HR CARE AT DC WITH SON BRIAN:  HE HAS ARRANGED FOR 24HR SITTER/NURSE AIDE FOR 1ST 2-3 WEEKS POST DC WITH [facility]  PT HAS RW, TUB BENCH, BSC AND LIFT CHAIR TO BE DELIVERED.  PT/SON DENY ANY ADDITIONAL NEEDS AND WILL NOTIFY THIS CASE MANAGER SHOULD THEY NEED ANYTHING ELSE FOR HOME. Phone #[phone]

## 2011-06-29 NOTE — Progress Notes (Addendum)
[address]            [place] [address]          [phone]     3 Days Post-Op  Procedure(s) (LRB): CORONARY ARTERY BYPASS GRAFTING (CABG) (N/A) Subjective: Feels well  Objective  Telemetry SR , SBrady/PAC's  Temp:  [97.3 F (36.3 C)-97.8 F (36.6 C)] 97.6 F (36.4 C) (03/28 0409) Pulse Rate:  [61-76] 62  (03/28 0409) Resp:  [15-23] 16  (03/28 0409) BP: (93-125)/(47-56) 112/51 mmHg (03/28 0409) SpO2:  [91 %-99 %] 92 % (03/28 0409) Weight:  [212 lb 6.4 oz (96.344 kg)] 212 lb 6.4 oz (96.344 kg) (03/28 0409)   Intake/Output Summary (Last 24 hours) at 06/29/11 0816 Last data filed at 06/29/11 0420  Gross per 24 hour  Intake    610 ml  Output    790 ml  Net   -180 ml       General appearance: alert, cooperative and no distress Heart: regular rate and rhythm and S1, S2 normal Lungs: clear to auscultation bilaterally Abdomen: benign exam Extremities: no edema Wound: incisions healing well without signs of infection  Lab Results:  Basename 06/29/11 0615 06/28/11 0445 06/27/11 1700 06/27/11 0330  NA 145 138 -- --  K 4.7 4.3 -- --  CL 107 104 -- --  CO2 32 29 -- --  GLUCOSE 109* 124* -- --  BUN 17 11 -- --  CREATININE 0.70 0.66 -- --  CALCIUM 8.8 8.4 -- --  MG -- -- 1.9 2.2  PHOS -- -- -- --   No results found for this basename: AST:2,ALT:2,ALKPHOS:2,BILITOT:2,PROT:2,ALBUMIN:2 in the last 72 hours No results found for this basename: LIPASE:2,AMYLASE:2 in the last 72 hours  Basename 06/29/11 0615 06/28/11 0445  WBC 11.2* 13.5*  NEUTROABS -- --  HGB 8.7* 8.4*  HCT 26.5* 24.8*  MCV 94.3 92.9  PLT 161 122*   No results found for this basename: CKTOTAL:4,CKMB:4,TROPONINI:4 in the last 72 hours No components found with this basename: POCBNP:3 No results found for this basename: DDIMER in the last 72 hours No results found for this basename: HGBA1C in the last 72 hours No results found for this basename:  CHOL,HDL,LDLCALC,TRIG,CHOLHDL in the last 72 hours No results found for this basename: TSH,T4TOTAL,FREET3,T3FREE,THYROIDAB in the last 72 hours No results found for this basename: VITAMINB12,FOLATE,FERRITIN,TIBC,IRON,RETICCTPCT in the last 72 hours  Medications: Scheduled    . acetaminophen  1,000 mg Oral Q6H   Or  . acetaminophen (TYLENOL) oral liquid 160 mg/5 mL  975 mg Per Tube Q6H  . aspirin EC  81 mg Oral Daily  . atorvastatin  20 mg Oral q1800  . bisacodyl  10 mg Oral Daily   Or  . bisacodyl  10 mg Rectal Daily  . cefUROXime (ZINACEF)  IV  1.5 g Intravenous Q12H  . docusate sodium  200 mg Oral Daily  . doxepin  10 mg Oral QHS  . enoxaparin  40 mg Subcutaneous QHS  . ezetimibe  10 mg Oral 2 times weekly  . FLUoxetine  20 mg Oral q morning - 10a  . furosemide  40 mg Oral Daily  . hydrocortisone  10 mg Oral Daily  . hydrocortisone sod succinate (SOLU-CORTEF) injection  50 mg Intravenous Q8H  . insulin aspart  0-24 Units Subcutaneous TID AC & HS  . insulin aspart  3 Units Subcutaneous TID WC  .  insulin glargine  30 Units Subcutaneous Daily  . moving right along book   Does not apply Once  . nebivolol  2.5 mg Oral QHS  . pantoprazole  40 mg Oral Q1200  . potassium chloride  20 mEq Oral Daily  . sodium chloride  3 mL Intravenous Q12H  . DISCONTD: hydrocortisone sod succinate (SOLU-CORTEF) injection  50 mg Intravenous Q6H  . DISCONTD: insulin aspart  0-24 Units Subcutaneous Q4H  . DISCONTD: insulin aspart  4 Units Subcutaneous TID WC  . DISCONTD: insulin glargine  40 Units Subcutaneous Daily  . DISCONTD: metoprolol tartrate  12.5 mg Per Tube BID  . DISCONTD: metoprolol tartrate  12.5 mg Oral BID  . DISCONTD: sodium chloride  3 mL Intravenous Q12H     Radiology/Studies:  Dg Chest Portable 1 View In Am  06/28/2011  *RADIOLOGY REPORT*  Clinical Data: Postop CABG, chest tubes removed  PORTABLE CHEST - 1 VIEW  Comparison: 06/27/2011  Findings: Interval removal of the left chest  tube and mediastinal drain.  No pneumothorax is seen.  Interval removal of the right IJ Swan-Ganz catheter.  Stable right IJ venous sheath.  Pulmonary vascular congestion without frank interstitial edema. Left lower lobe opacity, likely a combination of atelectasis and small pleural effusion.  Mild cardiomegaly. Postsurgical changes related to prior CABG.  IMPRESSION: Interval removal of the left chest tube and mediastinal drain.  No pneumothorax is seen.  Mild cardiomegaly and pulmonary vascular congestion without frank interstitial edema.  Left lower lobe opacity, likely a combination of atelectasis and small left pleural effusion.  Original Report Authenticated By: Charline Bills, M.D.    INR: Will add last result for INR, ABG once components are confirmed Will add last 4 CBG results once components are confirmed  Assessment/Plan: S/P Procedure(s) (LRB): CORONARY ARTERY BYPASS GRAFTING (CABG) (N/A)   1. Doing well 2. CBG's controlled, will need to stop Lantus when off IV steroids 3. Labs stable, H/H improved 4 cont diuresis, wt up 7 kg from preop 5. Monitor rhythm on current beta blocker dose 6. Poss home in 1-2 days  LOS: 6 days    GOLD,WAYNE E 3/28/20138:16 AM    She feels really well this evening, wants to go home in AM  Will plan for d/c tomorrow as long as she has had a BM

## 2011-06-29 NOTE — Progress Notes (Signed)
Patient ambulated 425ft with rolling walker without difficulty.  Had to take one standing rest break but otherwise tolerated well. Encouraged one more walk before end of day.  Will continue to monitor. Nickolas Madrid

## 2011-06-29 NOTE — Discharge Instructions (Signed)
Endoscopic Saphenous Vein Harvesting A procedure called saphenous vein harvesting is done as part of some heart surgeries. Blood vessels that carry blood to the heart can become blocked. When that happens, a surgeon can create a detour (bypass) around the blocked area. To do this, the surgeon uses a healthy blood vessel from someplace else in your body. The vein that runs along the inside of your leg (greater saphenous vein) is often used. It goes from your ankle to your groin. Saphenous vein harvesting is the procedure that is done to take part of this vein from your leg. For an endoscopic procedure, only very small cuts (incisions) are needed. The surgeon uses a tiny tool (endoscope) to find and take out part of the vein.  LET YOUR CAREGIVER KNOW ABOUT:  Allergies to food or medicine.   Medicines taken, including vitamins, herbs, eyedrops, over-the-counter medicines, and creams.   Use of steroids (by mouth or creams).   Previous problems with anesthetics or numbing medicines.   History of bleeding problems or blood clots.   Previous surgery.   Problems with your leg veins, such as painful or enlarged (varicose) veins.   Other health problems, including diabetes, history of wound problems, and kidney problems.   Possibility of pregnancy, if this applies.  RISKS AND COMPLICATIONS Saphenous vein harvesting is done as a part of heart surgery.The heart surgery may have risks. However, there are usually few problems with the vein harvesting part of surgery. This is especially true with an endoscopic procedure. However, problems can occur, such as:  Bleeding in the leg.   Bleeding under the skin.   Infection.   Leg pain.   Leg swelling.   Nerve damage.  Some people are more likely than others to have problems with saphenous vein harvesting. They include:  Women.   People with diabetes.   People who are overweight.   People who smoke.   People who have had a blood vessel  disease in their legs.   People who have varicose leg veins.  BEFORE THE PROCEDURE  A medical evaluation will be done. This may include an ultrasound exam to make sure your saphenous vein is healthy.   If you smoke, quit a few weeks before your surgery.   A week before the procedure, stop taking drugs that can cause bleeding during and after your surgery. This includes aspirin, nonsteroidal anti-inflammatory drugs, ibuprofen, and naproxen. Also stop taking vitamin E.   If you take blood thinners, ask your surgeon when you should stop taking them before surgery.   The day before the surgery, eat only a light dinner. Do not eat or drink anything after midnight. Ask your caregiver if it is okay to take any needed medicines with a sip of water.   Arrive at least 1 hour before the surgery, or as directed by your surgeon.  PROCEDURE Saphenous vein harvesting will be done at the very beginning of your heart surgery. It is usually done while your heart surgery is being started.  Small monitors will be put on your body. They are used to check your heart, blood pressure, and oxygen level.   You will be given an intravenous line (IV). A needle will be put in your arm or hand. It is hooked to a plastic tube. Medicine will flow directly into your body through the IV.   You will be given medicine that makes you sleep (general anesthetic).   A tube will be put in your throat to help  you breathe during the surgery. It will also be used to give you anesthetic medicine during the surgery.   A soft tube (catheter) may be put in your bladder to drain urine during and after the surgery.   Your leg will be cleaned with a germ-killing (antiseptic) solution.   When you are asleep, 1 to 3 incisions will be made on the inside of your leg. The endoscope will be put into these incisions. The endoscope is a long, thin tube with a tiny camera on the end. The surgeon will use this tool to find a segment of vein that  will work for the bypass. This segment is cut out.   Clips, ties, or electric current (cautery) may be used to seal off the vein. These methods stop the bleeding. Other veins will take over for the one that is removed to keep your leg healthy.   The surgeon will close the incisions. Clips or small stitches (sutures) may be used.   A drain may be placed under the skin of your leg. It looks like a long, thin tube, and it allows fluids to drain out of your leg after the surgery.   Your leg will probably be wrapped in an elastic bandage or stocking.   The vein that was harvested will be used in your heart surgery.  AFTER THE PROCEDURE  When your heart surgery is over, you will probably be taken directly to the intensive care unit (ICU). You will continue to use a breathing machine (ventilator) and get fluids through the IV for a while.   When no more fluid drains from your leg, the drain in your leg will be taken out.   You will be able to go home once you are recovered from your heart surgery. Most people stay in the hospital for at least 4 days. You may spend 1 to several days in an intensive care area. Then you may spend several days in a regular hospital room.  Document Released: 11/30/2010 Document Revised: 03/09/2011 Document Reviewed: 11/30/2010 [facility] Patient Information 2012 [place], [place].Coronary Artery Bypass Grafting Care After Refer to this sheet in the next few weeks. These instructions provide you with information on caring for yourself after your procedure. Your caregiver may also give you more specific instructions. Your treatment has been planned according to current medical practices, but problems sometimes occur. Call your caregiver if you have any problems or questions after your procedure.  Recovery from open heart surgery will be different for everyone. Some people feel well after 3 or 4 weeks, while for others it takes longer. After heart surgery, it may be normal  to:  Not have an appetite, feel nauseated by the smell of food, or only want to eat a small amount.   Be constipated because of changes in your diet, activity, and medicines. Eat foods high in fiber. Add fresh fruits and vegetables to your diet. Stool softeners may be helpful.   Feel sad or unhappy. You may be frustrated or cranky. You may have good days and bad days. Do not give up. Talk to your caregiver if you do not feel better.   Feel weakness and fatigue. You many need physical therapy or cardiac rehabilitation to get your strength back.   Develop an irregular heartbeat called atrial fibrillation. Symptoms of atrial fibrillation are a fast, irregular heartbeat or feelings of fluttery heartbeats, shortness of breath, low blood pressure, and dizziness. If these symptoms develop, see your caregiver right away.  MEDICATION  Have a list of all the medicines you will be taking when you leave the hospital. For every medicine, know the following:   Name.   Exact dose.   Time of day to be taken.   How often it should be taken.   Why you are taking it.   Ask which medicines should or should not be taken together. If you take more than one heart medicine, ask if it is okay to take them together. Some heart medicines should not be taken at the same time because they may lower your blood pressure too much.   Narcotic pain medicine can cause constipation. Eat fresh fruits and vegetables. Add fiber to your diet. Stool softener medicine may help relieve constipation.   Keep a copy of your medicines with you at all times.   Do not add or stop taking any medicine until you check with your caregiver.   Medicines can have side effects. Call your caregiver who prescribed the medicine if you:   Start throwing up, have diarrhea, or have stomach pain.   Feel dizzy or lightheaded when you stand up.   Feel your heart is skipping beats or is beating too fast or too slow.   Develop a rash.    Notice unusual bruising or bleeding.  HOME CARE INSTRUCTIONS  After heart surgery, it is important to learn how to take your pulse. Have your caregiver show you how to take your pulse.   Use your incentive spirometer. Ask your caregiver how long after surgery you need to use it.  Care of your chest incision  Tell your caregiver right away if you notice clicking in your chest (sternum).   Support your chest with a pillow or your arms when you take deep breaths and cough.   Follow your caregiver's instructions about when you can bathe or swim.   Protect your incision from sunlight during the first year to keep the scar from getting dark.   Tell your caregiver if you notice:   Increased tenderness of your incision.   Increased redness or swelling around your incision.   Drainage or pus from your incision.  Care of your leg incision(s)  Avoid crossing your legs.   Avoid sitting for long periods of time. Change positions every half hour.   Elevate your leg(s) when you are sitting.   Check your leg(s) daily for swelling. Check the incisions for redness or drainage.   Wear your elastic stockings as told by your caregiver. Take them off at bedtime.  Diet  Diet is very important to heart health.   Eat plenty of fresh fruits and vegetables. Meats should be lean cut. Avoid canned, processed, and fried foods.   Talk to a dietician. They can teach you how to make healthy food and drink choices.  Weight  Weigh yourself every day. This is important because it helps to know if you are retaining fluid that may make your heart and lungs work harder.   Use the same scale each time.   Weigh yourself every morning at the same time. You should do this after you go to the bathroom, but before you eat breakfast.   Your weight will be more accurate if you do not wear any clothes.   Record your weight.   Tell your caregiver if you have gained 2 pounds or more overnight.   Activity Stop any activity at once if you have chest pain, shortness of breath, irregular heartbeats, or dizziness. Get help right  away if you have any of these symptoms.  Bathing.  Avoid soaking in a bath or hot tub until your incisions are healed.   Rest. You need a balance of rest and activity.   Exercise. Exercise per your caregiver's advice. You may need physical therapy or cardiac rehabilitation to help strengthen your muscles and build your endurance.   Climbing stairs. Unless your caregiver tells you not to climb stairs, go up stairs slowly and rest if you tire. Do not pull yourself up by the handrail.   Driving a car. Follow your caregiver's advice on when you may drive. You may ride as a passenger at any time. When traveling for long periods of time in a car, get out of the car and walk around for a few minutes every 2 hours.   Lifting. Avoid lifting, pushing, or pulling anything heavier than 10 pounds for 6 weeks after surgery or as told by your caregiver.   Returning to work. Check with your caregiver. People heal at different rates. Most people will be able to go back to work 6 to 12 weeks after surgery.   Sexual activity. You may resume sexual relations as told by your caregiver.  SEEK MEDICAL CARE IF:  Any of your incisions are red, painful, or have any type of drainage coming from them.   You have an oral temperature above 102 F (38.9 C).   You have ankle or leg swelling.   You have pain in your legs.   You have weight gain of 2 or more pounds a day.   You feel dizzy or lightheaded when you stand up.  SEEK IMMEDIATE MEDICAL CARE IF:  You have angina or chest pain that goes to your jaw or arms. Call your local emergency services right away.   You have shortness of breath at rest or with activity.   You have a fast or irregular heartbeat (arrhythmia).   There is a "clicking" in your sternum when you move.   You have numbness or weakness in your arms or legs.   MAKE SURE YOU:  Understand these instructions.   Will watch your condition.   Will get help right away if you are not doing well or get worse.  Document Released: 10/07/2004 Document Revised: 03/09/2011 Document Reviewed: 05/25/2010 [facility] Patient Information 2012 [place], [place].

## 2011-06-29 NOTE — Progress Notes (Signed)
Pt was exposed to her husband's second hand smoke for years however her husband died 3 years age. Pt herself has never smoked. Discussed effects of second hand smoke and advised her to stay away from it. Pt verbalizes understanding.

## 2011-06-29 NOTE — Progress Notes (Signed)
Patient ambulating independently in hallway with family, tolerating well. [name]

## 2011-06-29 NOTE — Progress Notes (Signed)
CARDIAC REHAB PHASE I   PRE:  Rate/Rhythm: 62 SR  BP:  Supine:   Sitting: 100/50  Standing:    SaO2: 90 RA  MODE:  Ambulation: 350 ft   POST:  Rate/Rhythem: 87 SR  BP:  Supine:   Sitting: 118/58  Standing:    SaO2: 95 RA [id number] Assisted X 1 and used walker to ambulate. Gait steady with walker. VS stable. Increased distance today. Back to recliner after walk with call light in reach. Discussed Outpt. CRP with pt, she agrees to referral to [place].  Beatrix Fetters

## 2011-06-30 DIAGNOSIS — Z951 Presence of aortocoronary bypass graft: Secondary | ICD-10-CM

## 2011-06-30 LAB — GLUCOSE, CAPILLARY: Glucose-Capillary: 88 mg/dL (ref 70–99)

## 2011-06-30 NOTE — Discharge Summary (Signed)
Physician Discharge Summary  Patient ID: [name] MRN: [id number] DOB/AGE: October 19, 1940 71 y.o.  Admit date: 06/23/2011 Discharge date: 06/30/2011  Admission Diagnoses:  Patient Active Problem List  Diagnoses  . HYPERLIPIDEMIA  . HYPERTENSION  . PULMONARY NODULE  . OSTEOARTHRITIS  . CAD (coronary artery disease), native coronary artery    Discharge Diagnoses:   Patient Active Problem List  Diagnoses  . HYPERLIPIDEMIA  . HYPERTENSION  . PULMONARY NODULE  . OSTEOARTHRITIS  . CAD (coronary artery disease), native coronary artery  . S/P CABG x 3    Discharged Condition: good  Hospital Course:   [name] is a 71 yo female with a remote history of an abnormal Cardiolite study.  She has continued to experience of chest discomfort that were becoming more severe with radiation to her jaw.  She presented to Dr. Katrinka Blazing for further evaluation who felt she would benefit from cardiac catheterization to rule out coronary disease.  This was performed on 06/23/2011 which confirmed a preserved EF and severe three vessel Coronary Artery Disease.  She was subsequently admitted for evaluation by Cardiothoracic surgery for possible CABG procedure.  Dr. Dorris Fetch evaluated the patient and felt she would benefit from CABG procedure for survival benefit.  The benefits and risks of the procedure were explained to the patient and she was willing to proceed with CABG procedure.  [name] was taken to the OR on 06/26/2011 and underwent CABG x3 utilizing LIMA to LAD, RSVG to OM, and SVG to PCA and endoscopic saphenous vein harvest of the RLE.  She tolerated the procedure well and was taken to the surgical ICU, intubated but in stable condition.  POD #0 the patient was extubated without difficulty.  POD #1 the patient was doing well.  Her external pacemaker was discontinued.  The patients chest tubes and mediastinal drains were removed without difficulty.  POD #2 the patient was medically stable.   She was transferred to the stepdown unit.  Her CXR revealed some atelectatic changes and possible small left pleural effusion.  There was no evidence of pneumothorax.  POD #3 patient developed bursts of SVT, was monitored.  POD #4 the patient is doing well.  Her chest tube sutures and EPW were removed without difficulty.  Patient is in NSR controlled well with Nebivolol.  She is medically stable at this time and will be discharged home today.  She is to follow up with Dr. Dorris Fetch in 3 weeks with a CXR.  She is also to follow up with Dr. Katrinka Blazing with Cardiology in 2 weeks time.  Disposition: Home   Discharge Orders    Future Appointments: Provider: Department: Dept Phone: Center:   07/26/2011 10:30 AM Loreli Slot, MD [facility] [phone] [facility]     Medication List  As of 06/30/2011 10:07 AM   STOP taking these medications         valsartan 160 MG tablet      Vitamin D (Ergocalciferol) 50000 UNITS Caps         TAKE these medications         aspirin EC 81 MG tablet   Take 81 mg by mouth every morning.      doxepin 10 MG capsule   Commonly known as: SINEQUAN   Take 10 mg by mouth every evening.      ezetimibe 10 MG tablet   Commonly known as: ZETIA   Take 10 mg by mouth 2 (two) times a week. Tuesdays and Thursday  FLUoxetine 20 MG capsule   Commonly known as: PROZAC   Take 20 mg by mouth every morning.      furosemide 40 MG tablet   Commonly known as: LASIX   Take 1 tablet (40 mg total) by mouth daily.      hydrocortisone 5 MG tablet   Commonly known as: CORTEF   Take 10 mg by mouth daily.      LIVALO 4 MG Tabs   Generic drug: Pitavastatin Calcium   Take 1 tablet by mouth daily.      nebivolol 5 MG tablet   Commonly known as: BYSTOLIC   Take 2.5 mg by mouth at bedtime.      omeprazole 20 MG capsule   Commonly known as: PRILOSEC   Take 20 mg by mouth every morning.      potassium chloride SA 20 MEQ tablet   Commonly known as: K-DUR,KLOR-CON   Take  1 tablet (20 mEq total) by mouth daily.      traMADol 50 MG tablet   Commonly known as: ULTRAM   Take 2 tablets (100 mg total) by mouth every 6 (six) hours as needed.           Follow-up Information    Follow up with Loreli Slot, MD. (April 24 at 10:30 AM; please obtain a chest x-ray and [place] imaging at 9:30 AM in the same office complex)    Contact information:   [address] [place] [address] [phone]       Follow up with Lesleigh Noe, MD. (2 weeks- please call us to obtain appointment)    Contact information:   [address] [place] [address] [place] [address] [phone]          Signed: Lowella Dandy 06/30/2011, 10:07 AM

## 2011-06-30 NOTE — Progress Notes (Signed)
Ed completed. Agrees to CRPII. [id number] Ethelda Chick CES, ACSM

## 2011-06-30 NOTE — Progress Notes (Signed)
EPW dc intact, and per protocol pt tolerated well instructed bedrest for 1 hour and vital sign per protocol began, CT sutures dc intact, benzoin and steri strips applied per order. Will continue to monitor

## 2011-06-30 NOTE — Progress Notes (Signed)
Pt. Discharged 06/30/2011  2:06 PM Discharge instructions reviewed with patient/family. Patient/family verbalized understanding. All Rx's given. Questions answered as needed. Pt. Discharged to home with family/self.  [name]

## 2011-06-30 NOTE — Progress Notes (Signed)
UR Completed.  [name] [phone] 06/30/2011

## 2011-06-30 NOTE — Progress Notes (Addendum)
4 Days Post-Op Procedure(s) (LRB): CORONARY ARTERY BYPASS GRAFTING (CABG) (N/A)  Subjective: Patient with small bowel movement. No complaints and wants to go home.  Objective: Vital signs in last 24 hours: Patient Vitals for the past 24 hrs:  BP Temp Temp src Pulse Resp SpO2 Weight  06/30/11 0400 98/53 mmHg 98.8 F (37.1 C) Oral 63  20  90 % -  06/30/11 0304 - - - - - - 209 lb 3.5 oz (94.9 kg)  06/29/11 1944 128/53 mmHg 98.1 F (36.7 C) Oral 66  16  93 % -  06/29/11 1439 113/66 mmHg 98.6 F (37 C) Oral 64  20  94 % -   Pre op weight  89.4 kg Current Weight  06/30/11 209 lb 3.5 oz (94.9 kg)      Intake/Output from previous day: 03/28 0701 - 03/29 0700 In: 960 [P.O.:960] Out: 350 [Urine:350]   Physical Exam:  Cardiovascular: RRR, no murmurs, gallops, or rubs. Pulmonary: Slightly diminished at bases; no rales, wheezes, or rhonchi. Abdomen: Soft, non tender, bowel sounds present. Extremities: Mild bilateral lower extremity edema. Wounds: Clean and dry.  No erythema or signs of infection.  Lab Results: CBC: Basename 06/29/11 0615 06/28/11 0445  WBC 11.2* 13.5*  HGB 8.7* 8.4*  HCT 26.5* 24.8*  PLT 161 122*   BMET:  Basename 06/29/11 0615 06/28/11 0445  NA 145 138  K 4.7 4.3  CL 107 104  CO2 32 29  GLUCOSE 109* 124*  BUN 17 11  CREATININE 0.70 0.66  CALCIUM 8.8 8.4    PT/INR: No results found for this basename: LABPROT,INR in the last 72 hours ABG:  INR: Will add last result for INR, ABG once components are confirmed Will add last 4 CBG results once components are confirmed  Assessment/Plan:  1. CV - SR.Continue Nebivolol. 2.  Pulmonary - Encourage incentive spirometer. 3. Volume Overload - Continue with diuresis. 4.  Acute blood loss anemia - Last H/H 8.7/26.5 5.Remove EPW and chest tube sutures.   ZIMMERMAN,DONIELLE MPA-C 06/30/2011  Patient seen and examined. Agree with above. Home today

## 2011-07-03 MED FILL — Mannitol IV Soln 20%: INTRAVENOUS | Qty: 500 | Status: AC

## 2011-07-03 MED FILL — Electrolyte-R (PH 7.4) Solution: INTRAVENOUS | Qty: 3000 | Status: AC

## 2011-07-03 MED FILL — Heparin Sodium (Porcine) Inj 1000 Unit/ML: INTRAMUSCULAR | Qty: 10 | Status: AC

## 2011-07-03 MED FILL — Lidocaine HCl IV Inj 20 MG/ML: INTRAVENOUS | Qty: 5 | Status: AC

## 2011-07-03 MED FILL — Sodium Bicarbonate IV Soln 8.4%: INTRAVENOUS | Qty: 50 | Status: AC

## 2011-07-03 MED FILL — Sodium Chloride Irrigation Soln 0.9%: Qty: 3000 | Status: AC

## 2011-07-03 MED FILL — Sodium Chloride IV Soln 0.9%: INTRAVENOUS | Qty: 1000 | Status: AC

## 2011-07-03 MED FILL — Heparin Sodium (Porcine) Inj 1000 Unit/ML: INTRAMUSCULAR | Qty: 30 | Status: AC

## 2011-07-08 ENCOUNTER — Encounter (HOSPITAL_COMMUNITY): Payer: Self-pay | Admitting: *Deleted

## 2011-07-08 ENCOUNTER — Emergency Department (HOSPITAL_COMMUNITY): Payer: Medicare Other

## 2011-07-08 ENCOUNTER — Other Ambulatory Visit: Payer: Self-pay

## 2011-07-08 ENCOUNTER — Inpatient Hospital Stay (HOSPITAL_COMMUNITY)
Admission: EM | Admit: 2011-07-08 | Discharge: 2011-07-09 | DRG: 690 | Disposition: A | Discharge status: Home / Self Care | Payer: Medicare Other | Source: Ambulatory Visit | Attending: Internal Medicine | Admitting: Internal Medicine

## 2011-07-08 DIAGNOSIS — R1110 Vomiting, unspecified: Secondary | ICD-10-CM

## 2011-07-08 DIAGNOSIS — M1990 Unspecified osteoarthritis, unspecified site: Secondary | ICD-10-CM | POA: Diagnosis present

## 2011-07-08 DIAGNOSIS — N390 Urinary tract infection, site not specified: Principal | ICD-10-CM | POA: Diagnosis present

## 2011-07-08 DIAGNOSIS — I2510 Atherosclerotic heart disease of native coronary artery without angina pectoris: Secondary | ICD-10-CM | POA: Diagnosis present

## 2011-07-08 DIAGNOSIS — E876 Hypokalemia: Secondary | ICD-10-CM | POA: Diagnosis present

## 2011-07-08 DIAGNOSIS — E785 Hyperlipidemia, unspecified: Secondary | ICD-10-CM | POA: Diagnosis present

## 2011-07-08 DIAGNOSIS — D72829 Elevated white blood cell count, unspecified: Secondary | ICD-10-CM | POA: Diagnosis present

## 2011-07-08 DIAGNOSIS — I10 Essential (primary) hypertension: Secondary | ICD-10-CM | POA: Diagnosis present

## 2011-07-08 DIAGNOSIS — R112 Nausea with vomiting, unspecified: Secondary | ICD-10-CM | POA: Diagnosis present

## 2011-07-08 DIAGNOSIS — Z951 Presence of aortocoronary bypass graft: Secondary | ICD-10-CM

## 2011-07-08 DIAGNOSIS — T8149XA Infection following a procedure, other surgical site, initial encounter: Secondary | ICD-10-CM | POA: Diagnosis present

## 2011-07-08 HISTORY — DX: Primary adrenocortical insufficiency: E27.1

## 2011-07-08 LAB — DIFFERENTIAL
Eosinophils Absolute: 0.4 10*3/uL (ref 0.0–0.7)
Lymphocytes Relative: 23 % (ref 12–46)
Lymphs Abs: 5.3 10*3/uL — ABNORMAL HIGH (ref 0.7–4.0)
Monocytes Relative: 7 % (ref 3–12)
Neutrophils Relative %: 68 % (ref 43–77)

## 2011-07-08 LAB — CBC
Hemoglobin: 11.8 g/dL — ABNORMAL LOW (ref 12.0–15.0)
MCH: 30.6 pg (ref 26.0–34.0)
MCV: 90.7 fL (ref 78.0–100.0)
Platelets: 578 10*3/uL — ABNORMAL HIGH (ref 150–400)
RBC: 3.86 MIL/uL — ABNORMAL LOW (ref 3.87–5.11)
WBC: 23 10*3/uL — ABNORMAL HIGH (ref 4.0–10.5)

## 2011-07-08 LAB — COMPREHENSIVE METABOLIC PANEL
ALT: 28 U/L (ref 0–35)
Alkaline Phosphatase: 114 U/L (ref 39–117)
BUN: 19 mg/dL (ref 6–23)
CO2: 24 mEq/L (ref 19–32)
GFR calc Af Amer: >90 mL/min (ref >90)
GFR calc non Af Amer: 82 mL/min — ABNORMAL LOW (ref >90)
Glucose, Bld: 132 mg/dL — ABNORMAL HIGH (ref 70–99)
Potassium: 3.4 mEq/L — ABNORMAL LOW (ref 3.5–5.1)
Sodium: 137 mEq/L (ref 135–145)
Total Bilirubin: 0.7 mg/dL (ref 0.3–1.2)

## 2011-07-08 LAB — POCT I-STAT, CHEM 8
BUN: 19 mg/dL (ref 6–23)
Calcium, Ion: 1.18 mmol/L (ref 1.12–1.32)
HCT: 31 % — ABNORMAL LOW (ref 36.0–46.0)
Hemoglobin: 10.5 g/dL — ABNORMAL LOW (ref 12.0–15.0)
TCO2: 27 mmol/L (ref 0–100)

## 2011-07-08 LAB — URINALYSIS, ROUTINE W REFLEX MICROSCOPIC
Nitrite: NEGATIVE
Protein, ur: NEGATIVE mg/dL
Urobilinogen, UA: 1 mg/dL (ref 0.0–1.0)

## 2011-07-08 MED ORDER — POTASSIUM CHLORIDE CRYS ER 20 MEQ PO TBCR
40.0000 meq | EXTENDED_RELEASE_TABLET | Freq: Once | ORAL | Status: AC
  Administered 2011-07-08: 40 meq via ORAL
  Filled 2011-07-08: qty 2

## 2011-07-08 MED ORDER — ONDANSETRON HCL 4 MG/2ML IJ SOLN: 4.0000 mg | Freq: Once | INTRAMUSCULAR | Status: AC

## 2011-07-08 MED ORDER — ONDANSETRON HCL 4 MG/2ML IJ SOLN
INTRAMUSCULAR | Status: AC
  Administered 2011-07-08: 4 mg
  Filled 2011-07-08: qty 2

## 2011-07-08 NOTE — ED Provider Notes (Signed)
History     CSN: [id number]  Arrival date & time 07/08/11  1906   First MD Initiated Contact with Patient 07/08/11 2010      Chief Complaint  Patient presents with  . Emesis    (Consider location/radiation/quality/duration/timing/severity/associated sxs/prior treatment) HPI Patient developed temperature to approximately 100 and vomiting 3 times onset this evening. Vomited yellowish material. Denies other complaint denies pain denies cough denies urinary symptoms no treatment prior to coming here nothing makes symptoms better or worse Past Medical History  Diagnosis Date  . 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  last knee surgery dr. Berton Lan gave large dose of hydrocortisal and avoided the hypotensive side effectas of addison's disease.  [name] PONV (postoperative nausea and vomiting)   . Coronary artery disease   . Angina   . Hypertension   . Dysrhythmia   . Shortness of breath   . Recurrent upper respiratory infection (URI)   . Hypothyroidism   . GERD (gastroesophageal reflux disease)   . Arthritis   . Anxiety   . Depression     Past Surgical History  Procedure Date  . Back surgery   . Tonsillectomy   . Diagnostic laparoscopy   . Cardiac catheterization   . Eye surgery   . Dilation and curettage of uterus   . Joint replacement   . Cholecystectomy   . Coronary artery bypass graft 06/26/2011    Procedure: CORONARY ARTERY BYPASS GRAFTING (CABG);  Surgeon: Loreli Slot, MD;  Location: [facility] OR;  Service: Open Heart Surgery;  Laterality: N/A;  times using Greater Saphenous Vein Graft harvested endoscopically from right leg    History reviewed. No pertinent family history.  History  Substance Use Topics  . Smoking status: Never Smoker   . Smokeless tobacco: Not on file   Comment: pt exposed to second hand smoke for years.  . Alcohol Use: No    OB History    Grav Para Term Preterm Abortions TAB SAB Ect Mult Living                    Review of Systems  Unable to perform ROS Constitutional: Positive for fatigue.  Gastrointestinal: Positive for nausea and vomiting.  All other systems reviewed and are negative.    Allergies  Percocet and Sulfa antibiotics  Home Medications   Current Outpatient Rx  Name Route Sig Dispense Refill  . ASPIRIN EC 81 MG PO TBEC Oral Take 81 mg by mouth every morning.    [name] FLUOXETINE HCL 20 MG PO CAPS Oral Take 20 mg by mouth every morning.    [name] HYDROCORTISONE 5 MG PO TABS Oral Take 5 mg by mouth 2 (two) times daily.     [name] LEVOTHYROXINE SODIUM 50 MCG PO TABS Oral Take 50 mcg by mouth daily.    . NEBIVOLOL HCL 5 MG PO TABS Oral Take 5 mg by mouth at bedtime.     . OMEPRAZOLE 20 MG PO CPDR Oral Take 20 mg by mouth every morning.    [name] PROBIOTIC FORMULA PO Oral Take 1 capsule by mouth daily as needed. For upset stomach    . TRAMADOL HCL 50 MG PO TABS Oral Take 50 mg by mouth every 6 (six) hours as needed. For pain    . EZETIMIBE 10 MG PO TABS Oral Take 10 mg by mouth 2 (two) times a week. Tuesdays and Thursday      BP 112/71  Pulse 108  Temp(Src) 98.3 F (36.8 C) (Oral)  Resp 18  Ht 5\' 5"  (1.651 m)  Wt 190 lb (86.183 kg)  BMI 31.62 kg/m2  SpO2 96%  Physical Exam  Nursing note and vitals reviewed. Constitutional: She appears well-developed and well-nourished.  HENT:  Head: Normocephalic and atraumatic.  Eyes: Conjunctivae are normal. Pupils are equal, round, and reactive to light.  Neck: Neck supple. No tracheal deviation present. No thyromegaly present.  Cardiovascular: Normal rate and regular rhythm.   No murmur heard. Pulmonary/Chest: Effort normal and breath sounds normal.       Well-healing sternotomy wound no drainage. There are 2 slitlike surgical wounds at the epigastrium which nurse reports had foul-smelling yellowish drainage on the bandage. After being cleaned by the nurse wounds are clean appearing well-healing patient is nontender  Abdominal:  Soft. Bowel sounds are normal. She exhibits no distension. There is no tenderness.  Musculoskeletal: Normal range of motion. She exhibits no edema and no tenderness.  Neurological: She is alert. Coordination normal.  Skin: Skin is warm and dry. No rash noted.  Psychiatric: She has a normal mood and affect.    ED Course  Procedures (including critical care time) Results for orders placed during the hospital encounter of 07/08/11  CBC      Component Value Range   WBC 23.0 (*) 4.0 - 10.5 (K/uL)   RBC 3.86 (*) 3.87 - 5.11 (MIL/uL)   Hemoglobin 11.8 (*) 12.0 - 15.0 (g/dL)   HCT 78.4 (*) 69.6 - 46.0 (%)   MCV 90.7  78.0 - 100.0 (fL)   MCH 30.6  26.0 - 34.0 (pg)   MCHC 33.7  30.0 - 36.0 (g/dL)   RDW 29.5  28.4 - 13.2 (%)   Platelets 578 (*) 150 - 400 (K/uL)  DIFFERENTIAL      Component Value Range   Neutrophils Relative 68  43 - 77 (%)   Neutro Abs 15.6 (*) 1.7 - 7.7 (K/uL)   Lymphocytes Relative 23  12 - 46 (%)   Lymphs Abs 5.3 (*) 0.7 - 4.0 (K/uL)   Monocytes Relative 7  3 - 12 (%)   Monocytes Absolute 1.6 (*) 0.1 - 1.0 (K/uL)   Eosinophils Relative 2  0 - 5 (%)   Eosinophils Absolute 0.4  0.0 - 0.7 (K/uL)   Basophils Relative 0  0 - 1 (%)   Basophils Absolute 0.0  0.0 - 0.1 (K/uL)  COMPREHENSIVE METABOLIC PANEL      Component Value Range   Sodium 137  135 - 145 (mEq/L)   Potassium 3.4 (*) 3.5 - 5.1 (mEq/L)   Chloride 97  96 - 112 (mEq/L)   CO2 24  19 - 32 (mEq/L)   Glucose, Bld 132 (*) 70 - 99 (mg/dL)   BUN 19  6 - 23 (mg/dL)   Creatinine, Ser 4.40  0.50 - 1.10 (mg/dL)   Calcium 9.5  8.4 - 10.2 (mg/dL)   Total Protein 7.5  6.0 - 8.3 (g/dL)   Albumin 3.7  3.5 - 5.2 (g/dL)   AST 29  0 - 37 (U/L)   ALT 28  0 - 35 (U/L)   Alkaline Phosphatase 114  39 - 117 (U/L)   Total Bilirubin 0.7  0.3 - 1.2 (mg/dL)   GFR calc non Af Amer 82 (*) >90 (mL/min)   GFR calc Af Amer >90  >90 (mL/min)  URINALYSIS, ROUTINE W REFLEX MICROSCOPIC      Component Value Range   Color, Urine AMBER  (*)  YELLOW    APPearance CLOUDY (*) CLEAR    Specific Gravity, Urine 1.023  1.005 - 1.030    pH 5.5  5.0 - 8.0    Glucose, UA NEGATIVE  NEGATIVE (mg/dL)   Hgb urine dipstick SMALL (*) NEGATIVE    Bilirubin Urine SMALL (*) NEGATIVE    Ketones, ur NEGATIVE  NEGATIVE (mg/dL)   Protein, ur NEGATIVE  NEGATIVE (mg/dL)   Urobilinogen, UA 1.0  0.0 - 1.0 (mg/dL)   Nitrite NEGATIVE  NEGATIVE    Leukocytes, UA SMALL (*) NEGATIVE   URINE MICROSCOPIC-ADD ON      Component Value Range   Squamous Epithelial / LPF MANY (*) RARE    WBC, UA 3-6  <3 (WBC/hpf)   RBC / HPF 0-2  <3 (RBC/hpf)   Bacteria, UA FEW (*) RARE    Dg Chest 2 View  07/08/2011  *RADIOLOGY REPORT*  Clinical Data: Fever, recent CABG  CHEST - 2 VIEW  Comparison: 06/28/2011  Findings: Lungs are essentially clear. No pleural effusion or pneumothorax.  Heart is top normal in size. Postsurgical changes related to prior CABG.  Degenerative changes of the visualized thoracolumbar spine.  Cholecystectomy clips.  IMPRESSION: No evidence of acute cardiopulmonary disease.  Original Report Authenticated By: Charline Bills, M.D.   Dg Chest Portable 1 View In Am  06/28/2011  *RADIOLOGY REPORT*  Clinical Data: Postop CABG, chest tubes removed  PORTABLE CHEST - 1 VIEW  Comparison: 06/27/2011  Findings: Interval removal of the left chest tube and mediastinal drain.  No pneumothorax is seen.  Interval removal of the right IJ Swan-Ganz catheter.  Stable right IJ venous sheath.  Pulmonary vascular congestion without frank interstitial edema. Left lower lobe opacity, likely a combination of atelectasis and small pleural effusion.  Mild cardiomegaly. Postsurgical changes related to prior CABG.  IMPRESSION: Interval removal of the left chest tube and mediastinal drain.  No pneumothorax is seen.  Mild cardiomegaly and pulmonary vascular congestion without frank interstitial edema.  Left lower lobe opacity, likely a combination of atelectasis and small left pleural  effusion.  Original Report Authenticated By: Charline Bills, M.D.   Dg Chest Portable 1 View In Am  06/27/2011  *RADIOLOGY REPORT*  Clinical Data: Post CABG, chest tube  PORTABLE CHEST - 1 VIEW  Comparison: None.  Findings: Endotracheal nasogastric tube removed. Swan-Ganz catheter, tip at pulmonary artery bifurcation. Mediastinal drain and left thoracostomy tube remain. Enlargement of cardiac silhouette post CABG. Persistent atelectasis left lower lobe. Remaining lungs clear. No pneumothorax.  IMPRESSION: No significant change.  Original Report Authenticated By: Lollie Marrow, M.D.   Dg Chest Portable 1 View  06/26/2011  *RADIOLOGY REPORT*  Clinical Data: Postop CABG.  PORTABLE CHEST - 1 VIEW  Comparison: 06/21/2011  Findings: The patient is now postop.  Patient has had median sternotomy and CABG.  Endotracheal tube is in place with tip 2.6 cm above carina.  Nasogastric tube is in place with tip beyond the esophagogastric junction and off the film.  Bilateral chest tubes are in place.  Swan-Ganz catheter is in place, tip coiled back upon itself overlying the level of the left pulmonary arteries.  Heart is enlarged.  There is perihilar pulmonary vascular congestion.  No evidence for pneumothorax.  Minimal left base atelectasis.  IMPRESSION:  1.  Postoperative appearance. 2.  Swan-Ganz catheter tip coiled back upon itself overlying the region of the left pulmonary artery. 3.  No evidence for pneumothorax. 4.  Left lower lobe atelectasis.  Original Report Authenticated By:  Patterson Hammersmith, M.D.     Labs Reviewed  CBC  DIFFERENTIAL  COMPREHENSIVE METABOLIC PANEL   No results found.   No diagnosis found.  Patient feels much improved after treatment with intravenous Zofran, after having vomited yellowish material in the emergency department. At 11:15 PM she remains comfortable and feels well MDM  Concern that patient is recent postop for CABG, leukocytosis and reported drainage from surgical  wounds the one looks clean appearing Spoke with Dr. Laural Benes plan 23 hour observation Diagnosis #1 vomiting #2 leukocytosis #3 hypokalemia        Doug Sou, MD 07/08/11 2326

## 2011-07-08 NOTE — ED Notes (Addendum)
Pt started being nauseated this evening around 1800, vomited 2times already, shoulder, jaw and Gen body pain. Pt stated had low great temp of 99.9 at home. Pt had old mid chest incision from two week ago. S/P open heart surgery.  Post external pacemaker site  Left one with greenish discharge, right side with some yellowish discharge.

## 2011-07-08 NOTE — ED Notes (Signed)
Pt c/o nv sl  Elevated temp and not feeliong well since yesterday.  She vomited a small amount enroute here. Elevated heart rate. .  She had a cabg march 25th.  Alert no pain

## 2011-07-08 NOTE — ED Notes (Signed)
Pt updated on admission to the hosp.

## 2011-07-08 NOTE — H&P (Addendum)
History and Physical Examination  Date: 07/08/2011  Patient name: [name] Medical record number: [id number] Date of birth: [birth year] Age: 71 y.o. Gender: female PCP: No primary provider on file.  Attending physician: Lorane Gell, MD  Chief Complaint:  Chief Complaint  Patient presents with  . Emesis     History of Present Illness: [name] is an 71 y.o. female with DM, HTN, hypothyroidism CAD s/p recent CABG presented to ER today complaining of nausea and low grade fever.  She reports that she developed temperature to approximately 100 and vomiting 3 times onset this evening. Vomited yellowish material. She reported some greenish foul smelling draining from chest wounds.  She says the steristrips had appeared dirty but she had been told to let them fall off on their own and she did not bother them.  She denies other complaint denies pain denies cough denies urinary symptoms no treatment prior to coming here nothing makes symptoms better or worse.  She vomited a large amount in the ED and received Zofran IV.  This helped her symptoms tremendously.   The nausea and vomiting improved.  Of note, her WBC count has risen to 23000 since she was discharged from the hospital.  I spoke with her surgeon and she will be started on antibiotics.  Pt denies cough, chest pain, diarrhea, and denies urinary symptoms.  She does report that her heart rate has been elevated for the last 2 days.     Past Medical History Past Medical History  Diagnosis Date  . 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  last knee surgery dr. Berton Lan gave large dose of hydrocortisal and avoided the hypotensive side effectas of addison's disease.  [name] PONV (postoperative nausea and vomiting)   . Coronary artery disease   . Angina   . Hypertension   . Dysrhythmia   . Shortness of breath   . Recurrent upper respiratory infection (URI)   . Hypothyroidism   . GERD  (gastroesophageal reflux disease)   . Arthritis   . Anxiety   . Depression        Addison's Disease - pt taking steroids daily   Past Surgical History Past Surgical History  Procedure Date  . Back surgery   . Tonsillectomy   . Diagnostic laparoscopy   . Cardiac catheterization   . Eye surgery   . Dilation and curettage of uterus   . Joint replacement   . Cholecystectomy   . Coronary artery bypass graft 06/26/2011    Procedure: CORONARY ARTERY BYPASS GRAFTING (CABG);  Surgeon: Loreli Slot, MD;  Location: [facility] OR;  Service: Open Heart Surgery;  Laterality: N/A;  times using Greater Saphenous Vein Graft harvested endoscopically from right leg    Home Meds: Prior to Admission medications   Medication Sig Start Date End Date Taking? Authorizing Provider  aspirin EC 81 MG tablet Take 81 mg by mouth every morning.   Yes Historical Provider, MD  FLUoxetine (PROZAC) 20 MG capsule Take 20 mg by mouth every morning.   Yes Historical Provider, MD  hydrocortisone (CORTEF) 5 MG tablet Take 5 mg by mouth 2 (two) times daily.    Yes Historical Provider, MD  levothyroxine (SYNTHROID, LEVOTHROID) 50 MCG tablet Take 50 mcg by mouth daily.   Yes Historical Provider, MD  nebivolol (BYSTOLIC) 5 MG tablet Take 5 mg by mouth at bedtime.    Yes Historical Provider, MD  omeprazole (PRILOSEC) 20 MG  capsule Take 20 mg by mouth every morning.   Yes Historical Provider, MD  Probiotic Product (PROBIOTIC FORMULA PO) Take 1 capsule by mouth daily as needed. For upset stomach   Yes Historical Provider, MD  traMADol (ULTRAM) 50 MG tablet Take 50 mg by mouth every 6 (six) hours as needed. For pain   Yes Historical Provider, MD  ezetimibe (ZETIA) 10 MG tablet Take 10 mg by mouth 2 (two) times a week. Tuesdays and Thursday    Historical Provider, MD    Allergies: Percocet and Sulfa antibiotics  Social History:  History   Social History  . Marital Status: Widowed    Spouse Name: N/A    Number of Children:  N/A  . Years of Education: N/A   Occupational History  . Not on file.   Social History Main Topics  . Smoking status: Never Smoker   . Smokeless tobacco: Not on file   Comment: pt exposed to second hand smoke for years.  . Alcohol Use: No  . Drug Use: No  . Sexually Active: No   Other Topics Concern  . Not on file   Social History Narrative  . No narrative on file   Family History: History reviewed. No pertinent family history.  Review of Systems: Pertinent items are noted in HPI. All other systems reviewed and reported as negative.   Physical Exam: Blood pressure 104/68, pulse 96, temperature 98.3 F (36.8 C), temperature source Oral, resp. rate 25, height 5\' 5"  (1.651 m), weight 86.183 kg (190 lb), SpO2 95.00%. Constitutional: She appears well-developed and well-nourished.  HENT:  Head: Normocephalic and atraumatic.  Eyes: Conjunctivae are normal. Pupils are equal, round, and reactive to light.  Neck: Neck supple. No tracheal deviation present. No thyromegaly present.  Cardiovascular: Normal rate and regular rhythm.  No murmur heard.  Pulmonary/Chest: Effort normal and breath sounds normal. Healing sternotomy wound minimal drainage. There are 2 small surgical wounds near sternum which nurse reports had foul-smelling yellowish drainage on the bandage. After being cleaned by the nurse wounds are clean appearing and nontender  Abdominal: Soft. Bowel sounds are normal. Nontender, no masses Musculoskeletal: Normal range of motion. She exhibits no edema and no tenderness.  Neurological: She is alert. Coordination normal.  Skin: Skin is warm and dry. No rash noted.  Psychiatric: She has a normal mood and affect.    Lab  And Imaging results:  Results for orders placed during the hospital encounter of 07/08/11 (from the past 24 hour(s))  CBC     Status: Abnormal   Collection Time   07/08/11  7:59 PM      Component Value Range   WBC 23.0 (*) 4.0 - 10.5 (K/uL)   RBC 3.86 (*)  3.87 - 5.11 (MIL/uL)   Hemoglobin 11.8 (*) 12.0 - 15.0 (g/dL)   HCT 16.1 (*) 09.6 - 46.0 (%)   MCV 90.7  78.0 - 100.0 (fL)   MCH 30.6  26.0 - 34.0 (pg)   MCHC 33.7  30.0 - 36.0 (g/dL)   RDW 04.5  40.9 - 81.1 (%)   Platelets 578 (*) 150 - 400 (K/uL)  DIFFERENTIAL     Status: Abnormal   Collection Time   07/08/11  7:59 PM      Component Value Range   Neutrophils Relative 68  43 - 77 (%)   Neutro Abs 15.6 (*) 1.7 - 7.7 (K/uL)   Lymphocytes Relative 23  12 - 46 (%)   Lymphs Abs 5.3 (*) 0.7 -  4.0 (K/uL)   Monocytes Relative 7  3 - 12 (%)   Monocytes Absolute 1.6 (*) 0.1 - 1.0 (K/uL)   Eosinophils Relative 2  0 - 5 (%)   Eosinophils Absolute 0.4  0.0 - 0.7 (K/uL)   Basophils Relative 0  0 - 1 (%)   Basophils Absolute 0.0  0.0 - 0.1 (K/uL)  COMPREHENSIVE METABOLIC PANEL     Status: Abnormal   Collection Time   07/08/11  7:59 PM      Component Value Range   Sodium 137  135 - 145 (mEq/L)   Potassium 3.4 (*) 3.5 - 5.1 (mEq/L)   Chloride 97  96 - 112 (mEq/L)   CO2 24  19 - 32 (mEq/L)   Glucose, Bld 132 (*) 70 - 99 (mg/dL)   BUN 19  6 - 23 (mg/dL)   Creatinine, Ser 4.54  0.50 - 1.10 (mg/dL)   Calcium 9.5  8.4 - 09.8 (mg/dL)   Total Protein 7.5  6.0 - 8.3 (g/dL)   Albumin 3.7  3.5 - 5.2 (g/dL)   AST 29  0 - 37 (U/L)   ALT 28  0 - 35 (U/L)   Alkaline Phosphatase 114  39 - 117 (U/L)   Total Bilirubin 0.7  0.3 - 1.2 (mg/dL)   GFR calc non Af Amer 82 (*) >90 (mL/min)   GFR calc Af Amer >90  >90 (mL/min)  URINALYSIS, ROUTINE W REFLEX MICROSCOPIC     Status: Abnormal   Collection Time   07/08/11  8:50 PM      Component Value Range   Color, Urine AMBER (*) YELLOW    APPearance CLOUDY (*) CLEAR    Specific Gravity, Urine 1.023  1.005 - 1.030    pH 5.5  5.0 - 8.0    Glucose, UA NEGATIVE  NEGATIVE (mg/dL)   Hgb urine dipstick SMALL (*) NEGATIVE    Bilirubin Urine SMALL (*) NEGATIVE    Ketones, ur NEGATIVE  NEGATIVE (mg/dL)   Protein, ur NEGATIVE  NEGATIVE (mg/dL)   Urobilinogen, UA 1.0  0.0  - 1.0 (mg/dL)   Nitrite NEGATIVE  NEGATIVE    Leukocytes, UA SMALL (*) NEGATIVE   URINE MICROSCOPIC-ADD ON     Status: Abnormal   Collection Time   07/08/11  8:50 PM      Component Value Range   Squamous Epithelial / LPF MANY (*) RARE    WBC, UA 3-6  <3 (WBC/hpf)   RBC / HPF 0-2  <3 (RBC/hpf)   Bacteria, UA FEW (*) RARE    EKG Results:  Orders placed during the hospital encounter of 07/08/11  . ED EKG  . ED EKG     Impression   HYPERLIPIDEMIA  HYPERTENSION  OSTEOARTHRITIS  CAD (coronary artery disease), native coronary artery  Recently post-op S/P CABG x 3  Leukocytosis, unspecified  Hypokalemia  Wound infection after surgery  Nausea and vomiting  DM type 2  Plan  Admit to 2000, I spoke with her surgeon who will see her, start IVF, IV antibiotics, replace potassium, check mg, culture urine (pt not with symptoms of UTI) monitor blood glucose, provide medications for nausea as needed.  Pt has Addison's Disease and taking daily steroids which may be affecting her leukocytosis, will monitor, Please see orders.  Follow hospital course.    Standley Dakins MD [facility] [place], [place] [phone] 07/08/2011, 11:25 PM

## 2011-07-08 NOTE — ED Notes (Signed)
Return from xray

## 2011-07-08 NOTE — ED Notes (Signed)
Patient transported to X-ray 

## 2011-07-08 NOTE — ED Notes (Addendum)
Denies any pain or needs at this time

## 2011-07-09 DIAGNOSIS — N390 Urinary tract infection, site not specified: Secondary | ICD-10-CM | POA: Diagnosis present

## 2011-07-09 HISTORY — DX: Urinary tract infection, site not specified: N39.0

## 2011-07-09 LAB — COMPREHENSIVE METABOLIC PANEL
CO2: 29 mEq/L (ref 19–32)
Calcium: 9.1 mg/dL (ref 8.4–10.5)
Creatinine, Ser: 0.76 mg/dL (ref 0.50–1.10)
GFR calc Af Amer: >90 mL/min (ref >90)
GFR calc non Af Amer: 82 mL/min — ABNORMAL LOW (ref >90)
Glucose, Bld: 119 mg/dL — ABNORMAL HIGH (ref 70–99)

## 2011-07-09 LAB — CBC
Hemoglobin: 10.1 g/dL — ABNORMAL LOW (ref 12.0–15.0)
MCH: 30.2 pg (ref 26.0–34.0)
MCV: 91.6 fL (ref 78.0–100.0)
Platelets: 444 10*3/uL — ABNORMAL HIGH (ref 150–400)
RBC: 3.34 MIL/uL — ABNORMAL LOW (ref 3.87–5.11)

## 2011-07-09 LAB — DIFFERENTIAL
Basophils Absolute: 0 10*3/uL (ref 0.0–0.1)
Basophils Relative: 0 % (ref 0–1)
Neutro Abs: 8.3 10*3/uL — ABNORMAL HIGH (ref 1.7–7.7)
Neutrophils Relative %: 70 % (ref 43–77)

## 2011-07-09 LAB — MAGNESIUM: Magnesium: 2.1 mg/dL (ref 1.5–2.5)

## 2011-07-09 MED ORDER — ONDANSETRON HCL 4 MG PO TABS: 4.0000 mg | ORAL_TABLET | Freq: Four times a day (QID) | ORAL | Status: DC | PRN

## 2011-07-09 MED ORDER — NEBIVOLOL HCL 5 MG PO TABS
5.0000 mg | ORAL_TABLET | Freq: Every day | ORAL | Status: DC
  Administered 2011-07-09: 5 mg via ORAL
  Filled 2011-07-09 (×2): qty 1

## 2011-07-09 MED ORDER — POTASSIUM CHLORIDE IN NACL 20-0.9 MEQ/L-% IV SOLN
INTRAVENOUS | Status: DC
  Administered 2011-07-09: 02:00:00 via INTRAVENOUS
  Filled 2011-07-09 (×2): qty 1000

## 2011-07-09 MED ORDER — LEVOFLOXACIN IN D5W 750 MG/150ML IV SOLN
750.0000 mg | Freq: Every day | INTRAVENOUS | Status: DC
  Administered 2011-07-09: 750 mg via INTRAVENOUS
  Filled 2011-07-09 (×2): qty 150

## 2011-07-09 MED ORDER — PANTOPRAZOLE SODIUM 40 MG PO TBEC
40.0000 mg | DELAYED_RELEASE_TABLET | Freq: Every day | ORAL | Status: DC
  Administered 2011-07-09: 40 mg via ORAL
  Filled 2011-07-09: qty 1

## 2011-07-09 MED ORDER — LEVOTHYROXINE SODIUM 50 MCG PO TABS
50.0000 ug | ORAL_TABLET | Freq: Every day | ORAL | Status: DC
  Administered 2011-07-09: 50 ug via ORAL
  Filled 2011-07-09 (×2): qty 1

## 2011-07-09 MED ORDER — ACETAMINOPHEN 325 MG PO TABS: 650.0000 mg | ORAL_TABLET | Freq: Four times a day (QID) | ORAL | Status: DC | PRN

## 2011-07-09 MED ORDER — TRAMADOL HCL 50 MG PO TABS: 50.0000 mg | ORAL_TABLET | Freq: Four times a day (QID) | ORAL | Status: DC | PRN

## 2011-07-09 MED ORDER — FLUOXETINE HCL 20 MG PO CAPS
20.0000 mg | ORAL_CAPSULE | Freq: Every morning | ORAL | Status: DC
  Administered 2011-07-09: 20 mg via ORAL
  Filled 2011-07-09: qty 1

## 2011-07-09 MED ORDER — EZETIMIBE 10 MG PO TABS
10.0000 mg | ORAL_TABLET | ORAL | Status: DC
  Filled 2011-07-09: qty 1

## 2011-07-09 MED ORDER — LEVOFLOXACIN 750 MG PO TABS: 750.0000 mg | ORAL_TABLET | Freq: Every day | ORAL | Status: AC

## 2011-07-09 MED ORDER — BISACODYL 5 MG PO TBEC: 5.0000 mg | DELAYED_RELEASE_TABLET | Freq: Every day | ORAL | Status: DC | PRN

## 2011-07-09 MED ORDER — ASPIRIN EC 81 MG PO TBEC
81.0000 mg | DELAYED_RELEASE_TABLET | Freq: Every morning | ORAL | Status: DC
  Administered 2011-07-09: 81 mg via ORAL
  Filled 2011-07-09: qty 1

## 2011-07-09 MED ORDER — ALUM & MAG HYDROXIDE-SIMETH 200-200-20 MG/5ML PO SUSP: 30.0000 mL | Freq: Four times a day (QID) | ORAL | Status: DC | PRN

## 2011-07-09 MED ORDER — HYDROCORTISONE 5 MG PO TABS
5.0000 mg | ORAL_TABLET | Freq: Two times a day (BID) | ORAL | Status: DC
  Administered 2011-07-09 (×2): 5 mg via ORAL
  Filled 2011-07-09 (×3): qty 1

## 2011-07-09 MED ORDER — ACETAMINOPHEN 650 MG RE SUPP: 650.0000 mg | Freq: Four times a day (QID) | RECTAL | Status: DC | PRN

## 2011-07-09 MED ORDER — ONDANSETRON HCL 4 MG/2ML IJ SOLN: 4.0000 mg | Freq: Four times a day (QID) | INTRAMUSCULAR | Status: DC | PRN

## 2011-07-09 NOTE — Progress Notes (Signed)
Subjective: No new events Wants to go home     Physical Exam: Blood pressure 116/68, pulse 77, temperature 97.7 F (36.5 C), temperature source Oral, resp. rate 20, height 5' 4.5" (1.638 m), weight 88.5 kg (195 lb 1.7 oz), SpO2 96.00%. Patient Vitals for the past 24 hrs:  BP Temp Temp src Pulse Resp SpO2 Height Weight  07/09/11 0500 116/68 mmHg 97.7 F (36.5 C) Oral 77  20  96 % - -  07/09/11 0028 118/71 mmHg 97.3 F (36.3 C) Oral 92  20  97 % 5' 4.5" (1.638 m) 88.5 kg (195 lb 1.7 oz)  07/08/11 2300 119/79 mmHg - - - 16  - - -  07/08/11 2200 104/68 mmHg - - - 25  - - -  07/08/11 2045 119/81 mmHg - - 96  16  95 % - -  07/08/11 2030 127/75 mmHg - - 96  18  97 % - -  07/08/11 2015 119/70 mmHg - - 97  18  94 % - -  07/08/11 2000 159/98 mmHg - - 109  23  97 % - -  07/08/11 1956 112/71 mmHg 98.3 F (36.8 C) - - - - 5\' 5"  (1.651 m) 86.183 kg (190 lb)  07/08/11 1910 115/70 mmHg 98 F (36.7 C) Oral 108  18  96 % - -   Alert and oriented x3 Cvs: rrr Rs: ctab    Investigations:  No results found for this or any previous visit (from the past 240 hour(s)).   Basic Metabolic Panel:  Basename 07/09/11 0630 07/08/11 2339 07/08/11 1959  NA 139 139 --  K 5.0 3.9 --  CL 104 101 --  CO2 29 -- 24  GLUCOSE 119* 137* --  BUN 15 19 --  CREATININE 0.76 0.70 --  CALCIUM 9.1 -- 9.5  MG 2.1 -- --  PHOS -- -- --   Liver Function Tests:  [facility] 07/09/11 0630 07/08/11 1959  AST 22 29  ALT 22 28  ALKPHOS 93 114  BILITOT 0.9 0.7  PROT 6.3 7.5  ALBUMIN 3.0* 3.7     CBC:  Basename 07/09/11 0630 07/08/11 2339 07/08/11 1959  WBC 11.9* -- 23.0*  NEUTROABS 8.3* -- 15.6*  HGB 10.1* 10.5* --  HCT 30.6* 31.0* --  MCV 91.6 -- 90.7  PLT 444* -- 578*    Dg Chest 2 View  07/08/2011  *RADIOLOGY REPORT*  Clinical Data: Fever, recent CABG  CHEST - 2 VIEW  Comparison: 06/28/2011  Findings: Lungs are essentially clear. No pleural effusion or pneumothorax.  Heart is top normal in size.  Postsurgical changes related to prior CABG.  Degenerative changes of the visualized thoracolumbar spine.  Cholecystectomy clips.  IMPRESSION: No evidence of acute cardiopulmonary disease.  Original Report Authenticated By: Charline Bills, M.D.      Medications:  Scheduled:   . aspirin EC  81 mg Oral q morning - 10a  . ezetimibe  10 mg Oral 2 times weekly  . FLUoxetine  20 mg Oral q morning - 10a  . hydrocortisone  5 mg Oral BID  . levofloxacin (LEVAQUIN) IV  750 mg Intravenous QHS  . levothyroxine  50 mcg Oral QAC breakfast  . nebivolol  5 mg Oral QHS  . ondansetron      . ondansetron (ZOFRAN) IV  4 mg Intravenous Once  . pantoprazole  40 mg Oral Q1200  . potassium chloride  40 mEq Oral Once   Continuous:   . DISCONTD: 0.9 % NaCl with  KCl 20 mEq / L 75 mL/hr at 07/09/11 0203   VHQ:[id number], acetaminophen, alum & mag hydroxide-simeth, bisacodyl, ondansetron (ZOFRAN) IV, ondansetron, traMADol  Impression:  Principal Problem:  *UTI (lower urinary tract infection) Active Problems:  HYPERLIPIDEMIA  HYPERTENSION  OSTEOARTHRITIS  CAD (coronary artery disease), native coronary artery  S/P CABG x 3  Leukocytosis, unspecified  Hypokalemia  Nausea and vomiting     Plan: Cont current abx      LOS: 1 day   Abigail Teo, MD Pager: [phone] 07/09/2011, 10:20 AM

## 2011-07-09 NOTE — Progress Notes (Signed)
71 yo WF well known to me after CABG on 3/25. Presented to ED last PM with c/o nausea, vomiting, low grade fever.  71 yo with history of Addison's disease who had CABG x 3 on 3/25. Her postoperative course was unremarkable. She had a little SVT, but no sustained arrhythmias. She had a prior history of hypotension postoperatively, but that was not an issue after her CABG. She was discharged on POD 4.  Yesterday she noted early in the day that her HR was higher than normal. I advised her to increase her bystolic. Around 6:30 she developed nausea and shoulder and jaw discomfort. She was advised to come to ED. She then had several episodes of vomitting. No diarrhea. Her WBC was elevated. She also noted thick greenish drainage at chest tube sites. She was admitted, hydrated and started on antibiotics.   She felt immediately better after vomiting subsided and this AM feels well.  Past Medical History  Diagnosis Date  . 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  last knee surgery dr. Berton Lan gave large dose of hydrocortisal and avoided the hypotensive side effectas of addison's disease.  [name] PONV (postoperative nausea and vomiting)   . Coronary artery disease   . Angina   . Hypertension   . Dysrhythmia   . Shortness of breath   . Recurrent upper respiratory infection (URI)   . Hypothyroidism   . GERD (gastroesophageal reflux disease)   . Arthritis   . Anxiety   . Depression   . Addison's disease    Past Surgical History  Procedure Date  . Back surgery   . Tonsillectomy   . Diagnostic laparoscopy   . Cardiac catheterization   . Eye surgery   . Dilation and curettage of uterus   . Joint replacement   . Cholecystectomy   . Coronary artery bypass graft 06/26/2011    Procedure: CORONARY ARTERY BYPASS GRAFTING (CABG);  Surgeon: Loreli Slot, MD;  Location: [facility] OR;  Service: Open Heart Surgery;  Laterality: N/A;  times using Greater Saphenous  Vein Graft harvested endoscopically from right leg   Scheduled Meds:   . aspirin EC  81 mg Oral q morning - 10a  . ezetimibe  10 mg Oral 2 times weekly  . FLUoxetine  20 mg Oral q morning - 10a  . hydrocortisone  5 mg Oral BID  . levofloxacin (LEVAQUIN) IV  750 mg Intravenous QHS  . levothyroxine  50 mcg Oral QAC breakfast  . nebivolol  5 mg Oral QHS  . ondansetron      . ondansetron (ZOFRAN) IV  4 mg Intravenous Once  . pantoprazole  40 mg Oral Q1200  . potassium chloride  40 mEq Oral Once   Continuous Infusions:   . 0.9 % NaCl with KCl 20 mEq / L 75 mL/hr at 07/09/11 0203   PRN Meds:.acetaminophen, acetaminophen, alum & mag hydroxide-simeth, bisacodyl, ondansetron (ZOFRAN) IV, ondansetron, traMADol  On exam BP 116/68  Pulse 77  Temp(Src) 97.7 F (36.5 C) (Oral)  Resp 20  Ht 5' 4.5" (1.638 m)  Wt 195 lb 1.7 oz (88.5 kg)  BMI 32.97 kg/m2  SpO2 96% Gen - in NAD Cardiac RRR, normal S1 and S2, no rub Lungs- clear, equal bilaterally Sternum stable Sternal incision clean and dry, no erythema or draiinage CT sites partially open, dry, no erythema Abdomen soft, nontender, + BS Ext- right leg incision intact, ecchymosis Right thigh  CBC  Component Value Date/Time   WBC 11.9* 07/09/2011 0630   RBC 3.34* 07/09/2011 0630   HGB 10.1* 07/09/2011 0630   HCT 30.6* 07/09/2011 0630   PLT 444* 07/09/2011 0630   MCV 91.6 07/09/2011 0630   MCH 30.2 07/09/2011 0630   MCHC 33.0 07/09/2011 0630   RDW 13.0 07/09/2011 0630   LYMPHSABS 2.3 07/09/2011 0630   MONOABS 1.0 07/09/2011 0630   EOSABS 0.3 07/09/2011 0630   BASOSABS 0.0 07/09/2011 0630    CMP     Component Value Date/Time   NA 139 07/09/2011 0630   K 5.0 07/09/2011 0630   CL 104 07/09/2011 0630   CO2 29 07/09/2011 0630   GLUCOSE 119* 07/09/2011 0630   BUN 15 07/09/2011 0630   CREATININE 0.76 07/09/2011 0630   CALCIUM 9.1 07/09/2011 0630   PROT 6.3 07/09/2011 0630   ALBUMIN 3.0* 07/09/2011 0630   AST 22 07/09/2011 0630   ALT 22 07/09/2011 0630   ALKPHOS 93  07/09/2011 0630   BILITOT 0.9 07/09/2011 0630   GFRNONAA 82* 07/09/2011 0630   GFRAA >90 07/09/2011 0630   UA cloudy, small leuk esterase, 3-6 WBC and few bacteria  CXR- postop changes, good aeration  EKG- sinus tachy, NSST  IMPRESSION- 71 yo WF s/p recent CABG presents with nausea, vomiting and low grade temp. She was started on empiric levaquin. She feels much better this morning. It is still unclear what the source of N/V was. She does have a slightly + U/A, but her symptoms seem out of proportion to that. She also had some exudate at CT sites but they do not appear infected. Her other wounds are fine. Her WBC has dropped dramatically with one dose of levaquin.  RECOMMENDATIONS- Not much to add at this point. I would observe her to make sure she can tolerate PO intake. Would treat her UTI as is being done. Culture if she spikes. A wound culture was sent but I wouldn't put too much stock in the result as what she describes is a typical appearance of the chest tube sites in this time frame.

## 2011-07-09 NOTE — Discharge Summary (Signed)
Patient ID: [name] MRN: [id number] DOB/AGE: July 21, 1940 71 y.o. Primary Care Physician:PERINI,MARK A, MD, MD Admit date: 07/08/2011 Discharge date: 07/09/2011    Discharge Diagnoses:   Principal Problem:  *UTI (lower urinary tract infection) Active Problems:  HYPERLIPIDEMIA  HYPERTENSION  OSTEOARTHRITIS  CAD (coronary artery disease), native coronary artery  S/P CABG x 3  Leukocytosis, unspecified  Hypokalemia  Nausea and vomiting   Medication List  As of 07/09/2011  5:33 PM   START taking these medications         levofloxacin 750 MG tablet   Commonly known as: LEVAQUIN   Take 1 tablet (750 mg total) by mouth daily.         CONTINUE taking these medications         aspirin EC 81 MG tablet      ezetimibe 10 MG tablet   Commonly known as: ZETIA      FLUoxetine 20 MG capsule   Commonly known as: PROZAC      hydrocortisone 5 MG tablet   Commonly known as: CORTEF      levothyroxine 50 MCG tablet   Commonly known as: SYNTHROID, LEVOTHROID      nebivolol 5 MG tablet   Commonly known as: BYSTOLIC      omeprazole 20 MG capsule   Commonly known as: PRILOSEC      PROBIOTIC FORMULA PO      traMADol 50 MG tablet   Commonly known as: ULTRAM          Where to get your medications    These are the prescriptions that you need to pick up.   You may get these medications from any pharmacy.         levofloxacin 750 MG tablet            Discharged Condition:good   Consults:Dr. Dorris Fetch  Significant Diagnostic Studies: Dg Chest 2 View  07/08/2011  *RADIOLOGY REPORT*  Clinical Data: Fever, recent CABG  CHEST - 2 VIEW  Comparison: 06/28/2011  Findings: Lungs are essentially clear. No pleural effusion or pneumothorax.  Heart is top normal in size. Postsurgical changes related to prior CABG.  Degenerative changes of the visualized thoracolumbar spine.  Cholecystectomy clips.  IMPRESSION: No evidence of acute cardiopulmonary disease.  Original Report  Authenticated By: Charline Bills, M.D.   Dg Chest Portable 1 View In Am  06/28/2011  *RADIOLOGY REPORT*  Clinical Data: Postop CABG, chest tubes removed  PORTABLE CHEST - 1 VIEW  Comparison: 06/27/2011  Findings: Interval removal of the left chest tube and mediastinal drain.  No pneumothorax is seen.  Interval removal of the right IJ Swan-Ganz catheter.  Stable right IJ venous sheath.  Pulmonary vascular congestion without frank interstitial edema. Left lower lobe opacity, likely a combination of atelectasis and small pleural effusion.  Mild cardiomegaly. Postsurgical changes related to prior CABG.  IMPRESSION: Interval removal of the left chest tube and mediastinal drain.  No pneumothorax is seen.  Mild cardiomegaly and pulmonary vascular congestion without frank interstitial edema.  Left lower lobe opacity, likely a combination of atelectasis and small left pleural effusion.  Original Report Authenticated By: Charline Bills, M.D.   Dg Chest Portable 1 View In Am  06/27/2011  *RADIOLOGY REPORT*  Clinical Data: Post CABG, chest tube  PORTABLE CHEST - 1 VIEW  Comparison: None.  Findings: Endotracheal nasogastric tube removed. Swan-Ganz catheter, tip at pulmonary artery bifurcation. Mediastinal drain and left thoracostomy tube remain. Enlargement of cardiac silhouette post CABG. Persistent atelectasis  left lower lobe. Remaining lungs clear. No pneumothorax.  IMPRESSION: No significant change.  Original Report Authenticated By: Lollie Marrow, M.D.   Dg Chest Portable 1 View  06/26/2011  *RADIOLOGY REPORT*  Clinical Data: Postop CABG.  PORTABLE CHEST - 1 VIEW  Comparison: 06/21/2011  Findings: The patient is now postop.  Patient has had median sternotomy and CABG.  Endotracheal tube is in place with tip 2.6 cm above carina.  Nasogastric tube is in place with tip beyond the esophagogastric junction and off the film.  Bilateral chest tubes are in place.  Swan-Ganz catheter is in place, tip coiled back upon  itself overlying the level of the left pulmonary arteries.  Heart is enlarged.  There is perihilar pulmonary vascular congestion.  No evidence for pneumothorax.  Minimal left base atelectasis.  IMPRESSION:  1.  Postoperative appearance. 2.  Swan-Ganz catheter tip coiled back upon itself overlying the region of the left pulmonary artery. 3.  No evidence for pneumothorax. 4.  Left lower lobe atelectasis.  Original Report Authenticated By: Patterson Hammersmith, M.D.    Lab Results: Results for orders placed during the hospital encounter of 07/08/11 (from the past 48 hour(s))  CBC     Status: Abnormal   Collection Time   07/08/11  7:59 PM      Component Value Range Comment   WBC 23.0 (*) 4.0 - 10.5 (K/uL)    RBC 3.86 (*) 3.87 - 5.11 (MIL/uL)    Hemoglobin 11.8 (*) 12.0 - 15.0 (g/dL)    HCT 16.1 (*) 09.6 - 46.0 (%)    MCV 90.7  78.0 - 100.0 (fL)    MCH 30.6  26.0 - 34.0 (pg)    MCHC 33.7  30.0 - 36.0 (g/dL)    RDW 04.5  40.9 - 81.1 (%)    Platelets 578 (*) 150 - 400 (K/uL)   DIFFERENTIAL     Status: Abnormal   Collection Time   07/08/11  7:59 PM      Component Value Range Comment   Neutrophils Relative 68  43 - 77 (%)    Neutro Abs 15.6 (*) 1.7 - 7.7 (K/uL)    Lymphocytes Relative 23  12 - 46 (%)    Lymphs Abs 5.3 (*) 0.7 - 4.0 (K/uL)    Monocytes Relative 7  3 - 12 (%)    Monocytes Absolute 1.6 (*) 0.1 - 1.0 (K/uL)    Eosinophils Relative 2  0 - 5 (%)    Eosinophils Absolute 0.4  0.0 - 0.7 (K/uL)    Basophils Relative 0  0 - 1 (%)    Basophils Absolute 0.0  0.0 - 0.1 (K/uL)   COMPREHENSIVE METABOLIC PANEL     Status: Abnormal   Collection Time   07/08/11  7:59 PM      Component Value Range Comment   Sodium 137  135 - 145 (mEq/L)    Potassium 3.4 (*) 3.5 - 5.1 (mEq/L)    Chloride 97  96 - 112 (mEq/L)    CO2 24  19 - 32 (mEq/L)    Glucose, Bld 132 (*) 70 - 99 (mg/dL)    BUN 19  6 - 23 (mg/dL)    Creatinine, Ser 9.14  0.50 - 1.10 (mg/dL)    Calcium 9.5  8.4 - 10.5 (mg/dL)    Total  Protein 7.5  6.0 - 8.3 (g/dL)    Albumin 3.7  3.5 - 5.2 (g/dL)    AST 29  0 - 37 (U/L)  ALT 28  0 - 35 (U/L)    Alkaline Phosphatase 114  39 - 117 (U/L)    Total Bilirubin 0.7  0.3 - 1.2 (mg/dL)    GFR calc non Af Amer 82 (*) >90 (mL/min)    GFR calc Af Amer >90  >90 (mL/min)   URINALYSIS, ROUTINE W REFLEX MICROSCOPIC     Status: Abnormal   Collection Time   07/08/11  8:50 PM      Component Value Range Comment   Color, Urine AMBER (*) YELLOW  BIOCHEMICALS MAY BE AFFECTED BY COLOR   APPearance CLOUDY (*) CLEAR     Specific Gravity, Urine 1.023  1.005 - 1.030     pH 5.5  5.0 - 8.0     Glucose, UA NEGATIVE  NEGATIVE (mg/dL)    Hgb urine dipstick SMALL (*) NEGATIVE     Bilirubin Urine SMALL (*) NEGATIVE     Ketones, ur NEGATIVE  NEGATIVE (mg/dL)    Protein, ur NEGATIVE  NEGATIVE (mg/dL)    Urobilinogen, UA 1.0  0.0 - 1.0 (mg/dL)    Nitrite NEGATIVE  NEGATIVE     Leukocytes, UA SMALL (*) NEGATIVE    URINE MICROSCOPIC-ADD ON     Status: Abnormal   Collection Time   07/08/11  8:50 PM      Component Value Range Comment   Squamous Epithelial / LPF MANY (*) RARE     WBC, UA 3-6  <3 (WBC/hpf)    RBC / HPF 0-2  <3 (RBC/hpf)    Bacteria, UA FEW (*) RARE    POCT I-STAT, CHEM 8     Status: Abnormal   Collection Time   07/08/11 11:39 PM      Component Value Range Comment   Sodium 139  135 - 145 (mEq/L)    Potassium 3.9  3.5 - 5.1 (mEq/L)    Chloride 101  96 - 112 (mEq/L)    BUN 19  6 - 23 (mg/dL)    Creatinine, Ser 1.61  0.50 - 1.10 (mg/dL)    Glucose, Bld 096 (*) 70 - 99 (mg/dL)    Calcium, Ion 0.45  1.12 - 1.32 (mmol/L)    TCO2 27  0 - 100 (mmol/L)    Hemoglobin 10.5 (*) 12.0 - 15.0 (g/dL)    HCT 40.9 (*) 81.1 - 46.0 (%)   CBC     Status: Abnormal   Collection Time   07/09/11  6:30 AM      Component Value Range Comment   WBC 11.9 (*) 4.0 - 10.5 (K/uL)    RBC 3.34 (*) 3.87 - 5.11 (MIL/uL)    Hemoglobin 10.1 (*) 12.0 - 15.0 (g/dL)    HCT 91.4 (*) 78.2 - 46.0 (%)    MCV 91.6  78.0 -  100.0 (fL)    MCH 30.2  26.0 - 34.0 (pg)    MCHC 33.0  30.0 - 36.0 (g/dL)    RDW 95.6  21.3 - 08.6 (%)    Platelets 444 (*) 150 - 400 (K/uL) DELTA CHECK NOTED  COMPREHENSIVE METABOLIC PANEL     Status: Abnormal   Collection Time   07/09/11  6:30 AM      Component Value Range Comment   Sodium 139  135 - 145 (mEq/L)    Potassium 5.0  3.5 - 5.1 (mEq/L) NO VISIBLE HEMOLYSIS   Chloride 104  96 - 112 (mEq/L)    CO2 29  19 - 32 (mEq/L)    Glucose, Bld 119 (*) 70 -  99 (mg/dL)    BUN 15  6 - 23 (mg/dL)    Creatinine, Ser 1.61  0.50 - 1.10 (mg/dL)    Calcium 9.1  8.4 - 10.5 (mg/dL)    Total Protein 6.3  6.0 - 8.3 (g/dL)    Albumin 3.0 (*) 3.5 - 5.2 (g/dL)    AST 22  0 - 37 (U/L)    ALT 22  0 - 35 (U/L)    Alkaline Phosphatase 93  39 - 117 (U/L)    Total Bilirubin 0.9  0.3 - 1.2 (mg/dL)    GFR calc non Af Amer 82 (*) >90 (mL/min)    GFR calc Af Amer >90  >90 (mL/min)   MAGNESIUM     Status: Normal   Collection Time   07/09/11  6:30 AM      Component Value Range Comment   Magnesium 2.1  1.5 - 2.5 (mg/dL)   DIFFERENTIAL     Status: Abnormal   Collection Time   07/09/11  6:30 AM      Component Value Range Comment   Neutrophils Relative 70  43 - 77 (%)    Neutro Abs 8.3 (*) 1.7 - 7.7 (K/uL)    Lymphocytes Relative 20  12 - 46 (%)    Lymphs Abs 2.3  0.7 - 4.0 (K/uL)    Monocytes Relative 8  3 - 12 (%)    Monocytes Absolute 1.0  0.1 - 1.0 (K/uL)    Eosinophils Relative 3  0 - 5 (%)    Eosinophils Absolute 0.3  0.0 - 0.7 (K/uL)    Basophils Relative 0  0 - 1 (%)    Basophils Absolute 0.0  0.0 - 0.1 (K/uL)    No results found for this or any previous visit (from the past 240 hour(s)).   Hospital Course:  71 yo woman with recent CABG, admitted from the ED on 07/08/11 with emesis and leukocytosis. She was given antiemetics and iv fluids and started on empiric abx for presumed UTI. Also her chest wound drainage was sent for culture. By the next day she felt much better and after 12 more hours of  observation, given stability , no fever, reduction in leukocytosis we decided to Dc home on po levaquin and close f/u with Dr. Waynard Edwards  Discharge Exam: Blood pressure 102/66, pulse 82, temperature 97.8 F (36.6 C), temperature source Oral, resp. rate 20, height 5' 4.5" (1.638 m), weight 88.5 kg (195 lb 1.7 oz), SpO2 99.00%. Alert and oriented x3 CVS: rrr RS: CTAB  Abdomen : soft, NT    Disposition: home    Discharge Orders    Future Appointments: Provider: Department: Dept Phone: Center:   07/26/2011 10:30 AM Loreli Slot, MD [facility] [phone] [facility]   07/27/2011 8:00 AM [facility] [phone] None   07/31/2011 9:45 AM [facility] [phone] None   08/02/2011 9:45 AM [facility] [phone] None   08/04/2011 9:45 AM [facility] [phone] None   08/07/2011 9:45 AM [facility] [phone] None   08/09/2011 9:45 AM [facility] [phone] None   08/11/2011 9:45 AM [facility] [phone] None   08/14/2011 9:45 AM [facility] [phone] None   08/16/2011 9:45 AM [facility] [phone] None   08/18/2011 9:45 AM [facility] [phone] None   08/21/2011 9:45 AM [facility]  [facility] [phone] None   08/23/2011 9:45 AM [facility] [phone] None   08/25/2011 9:45 AM [facility] [phone] None   08/30/2011 9:45 AM [facility] [phone] None   09/01/2011 9:45 AM [facility] [phone] None   09/04/2011 9:45 AM [facility] [phone] None   09/06/2011 9:45 AM [facility] [phone] None   09/08/2011 9:45 AM [facility] [phone] None   09/11/2011 9:45 AM [facility] [phone] None   09/13/2011 9:45 AM [facility] [phone] None   09/15/2011 9:45 AM [facility] [phone] None   09/18/2011 9:45 AM [facility] [phone] None   09/20/2011 9:45 AM [facility] [phone] None   09/22/2011 9:45 AM [facility] [phone] None   09/25/2011 9:45 AM [facility] [phone] None   09/27/2011 9:45 AM [facility] [phone] None   09/29/2011 9:45 AM [facility] [phone] None   10/02/2011 9:45 AM [facility] [phone] None   10/04/2011 9:45 AM [facility] [phone] None   10/06/2011 9:45 AM [facility] [phone] None   10/09/2011 9:45 AM [facility] [phone] None   10/11/2011 9:45 AM [facility] [phone] None   10/13/2011 9:45 AM [facility] [phone] None   10/16/2011 9:45 AM [facility] [phone] None   10/18/2011 9:45 AM [facility] [phone] None   10/20/2011 9:45 AM [facility] [phone] None   10/23/2011 9:45 AM [facility] [phone] None   10/25/2011 9:45 AM [facility] [phone] None   10/27/2011 9:45 AM [facility] [phone] None   10/30/2011 9:45 AM [facility] [phone] None   11/01/2011 9:45 AM [facility] [phone] None   11/03/2011 9:45 AM [facility] [phone] None     Future Orders Please Complete By Expires   Diet - low sodium heart healthy       Increase activity slowly         Follow-up Information    Schedule an appointment as soon as possible for a visit with Ezequiel Kayser, MD.   Contact information:   [id number] [facility] [place] [address] [phone]          Signed: Lonia Blood 07/09/2011, 5:33 PM

## 2011-07-09 NOTE — ED Notes (Signed)
updated about room #

## 2011-07-10 ENCOUNTER — Telehealth: Payer: Self-pay | Admitting: Internal Medicine

## 2011-07-11 ENCOUNTER — Telehealth: Payer: Self-pay | Admitting: Internal Medicine

## 2011-07-11 LAB — WOUND CULTURE: Gram Stain: NONE SEEN

## 2011-07-11 NOTE — Telephone Encounter (Signed)
Culture without growth

## 2011-07-11 NOTE — Telephone Encounter (Signed)
Culture without significant growth

## 2011-07-20 ENCOUNTER — Other Ambulatory Visit: Payer: Self-pay | Admitting: Thoracic Surgery (Cardiothoracic Vascular Surgery)

## 2011-07-20 DIAGNOSIS — I2510 Atherosclerotic heart disease of native coronary artery without angina pectoris: Secondary | ICD-10-CM

## 2011-07-26 ENCOUNTER — Ambulatory Visit (INDEPENDENT_AMBULATORY_CARE_PROVIDER_SITE_OTHER): Payer: Self-pay | Admitting: Thoracic Surgery (Cardiothoracic Vascular Surgery)

## 2011-07-26 ENCOUNTER — Ambulatory Visit
Admission: RE | Admit: 2011-07-26 | Discharge: 2011-07-26 | Disposition: A | Discharge status: Home / Self Care | Payer: Medicare Other | Source: Ambulatory Visit | Attending: Thoracic Surgery (Cardiothoracic Vascular Surgery) | Admitting: Thoracic Surgery (Cardiothoracic Vascular Surgery)

## 2011-07-26 ENCOUNTER — Encounter: Payer: Self-pay | Admitting: Thoracic Surgery (Cardiothoracic Vascular Surgery)

## 2011-07-26 VITALS — BP 127/78 | HR 75 | Resp 16 | Ht 64.0 in | Wt 197.0 lb

## 2011-07-26 DIAGNOSIS — I2510 Atherosclerotic heart disease of native coronary artery without angina pectoris: Secondary | ICD-10-CM

## 2011-07-26 DIAGNOSIS — Z951 Presence of aortocoronary bypass graft: Secondary | ICD-10-CM

## 2011-07-26 NOTE — Progress Notes (Signed)
  HPI:  [name] returns for routine postoperative follow-up having undergone CABG x 3 on 06/26/11. Her postoperative recovery while in the hospital was unremarkable. Since hospital discharge she says she has continued to do well. She has taken NTG twice for what sound like surgery related pains. Sharp left sided pain along mammary bed. No SOB or edema.  Past Medical History  Diagnosis Date  . 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  last knee surgery dr. Berton Scott gave large dose of hydrocortisal and avoided the hypotensive side effectas of addison's disease.  [name] PONV (postoperative nausea and vomiting)   . Coronary artery disease   . Angina   . Hypertension   . Dysrhythmia   . Shortness of breath   . Recurrent upper respiratory infection (URI)   . Hypothyroidism   . GERD (gastroesophageal reflux disease)   . Arthritis   . Anxiety   . Depression   . Addison's disease     Current Outpatient Prescriptions  Medication Sig Dispense Refill  . aspirin EC 81 MG tablet Take 81 mg by mouth every morning.      . ezetimibe (ZETIA) 10 MG tablet Take 10 mg by mouth 2 (two) times a week. Tuesdays and Thursday      . FLUoxetine (PROZAC) 20 MG capsule Take 20 mg by mouth every morning.      . hydrocortisone (CORTEF) 5 MG tablet Take 5 mg by mouth 2 (two) times daily.       [name] levothyroxine (SYNTHROID, LEVOTHROID) 50 MCG tablet Take 50 mcg by mouth daily.      . nebivolol (BYSTOLIC) 5 MG tablet Take 5 mg by mouth at bedtime.       [name] omeprazole (PRILOSEC) 20 MG capsule Take 20 mg by mouth every morning.      . Pitavastatin Calcium 4 MG TABS Take by mouth 1 day or 1 dose.      . Probiotic Product (PROBIOTIC FORMULA PO) Take 1 capsule by mouth daily as needed. For upset stomach      . traMADol (ULTRAM) 50 MG tablet Take 50 mg by mouth every 6 (six) hours as needed. For pain      . vitamin B-12 (CYANOCOBALAMIN) 1000 MCG tablet Take 1,200 mcg by mouth  daily.      . Vitamin D, Ergocalciferol, (DRISDOL) 50000 UNITS CAPS Take 50,000 Units by mouth.        Physical Exam BP 127/78  Pulse 75  Resp 16  Ht 5\' 4"  (1.626 m)  Wt 197 lb (89.359 kg)  BMI 33.82 kg/m2  SpO2 94% General- well developed, well nourished in NAD Neuro A & O x 3, no focal deficit' Lungs- clear, equal Cardiac- RRR, no rub or murmur Sternum stable Wounds clean and healing well Ext- no edema  Diagnostic Tests: CXR- no effusions or infiltrates  Impression: 71 yo WF 4 weeks out from CABG x 3. Doing well at this point in time. Will start rehab next week. She is not to lift anything over 10 lbs. For another 2 weeks. She may drive, appropriate precautions discussed. All of her questions were answered.  Plan: She will continue to be followed by Dr. Katrinka Scott. I would be happy to see her back at any time I can be of any further assistance

## 2011-07-27 ENCOUNTER — Encounter (HOSPITAL_COMMUNITY)
Admission: RE | Admit: 2011-07-27 | Discharge: 2011-07-27 | Disposition: A | Discharge status: Home / Self Care | Payer: Medicare Other | Source: Ambulatory Visit | Attending: Interventional Cardiology | Admitting: Interventional Cardiology

## 2011-07-27 DIAGNOSIS — Z7982 Long term (current) use of aspirin: Secondary | ICD-10-CM | POA: Insufficient documentation

## 2011-07-27 DIAGNOSIS — F3289 Other specified depressive episodes: Secondary | ICD-10-CM | POA: Insufficient documentation

## 2011-07-27 DIAGNOSIS — F329 Major depressive disorder, single episode, unspecified: Secondary | ICD-10-CM | POA: Insufficient documentation

## 2011-07-27 DIAGNOSIS — I498 Other specified cardiac arrhythmias: Secondary | ICD-10-CM | POA: Insufficient documentation

## 2011-07-27 DIAGNOSIS — Z886 Allergy status to analgesic agent status: Secondary | ICD-10-CM | POA: Insufficient documentation

## 2011-07-27 DIAGNOSIS — Z880 Allergy status to penicillin: Secondary | ICD-10-CM | POA: Insufficient documentation

## 2011-07-27 DIAGNOSIS — E039 Hypothyroidism, unspecified: Secondary | ICD-10-CM | POA: Insufficient documentation

## 2011-07-27 DIAGNOSIS — E785 Hyperlipidemia, unspecified: Secondary | ICD-10-CM | POA: Insufficient documentation

## 2011-07-27 DIAGNOSIS — Z951 Presence of aortocoronary bypass graft: Secondary | ICD-10-CM | POA: Insufficient documentation

## 2011-07-27 DIAGNOSIS — Z5189 Encounter for other specified aftercare: Secondary | ICD-10-CM | POA: Insufficient documentation

## 2011-07-27 DIAGNOSIS — K219 Gastro-esophageal reflux disease without esophagitis: Secondary | ICD-10-CM | POA: Insufficient documentation

## 2011-07-27 DIAGNOSIS — I2510 Atherosclerotic heart disease of native coronary artery without angina pectoris: Secondary | ICD-10-CM | POA: Insufficient documentation

## 2011-07-27 DIAGNOSIS — I10 Essential (primary) hypertension: Secondary | ICD-10-CM | POA: Insufficient documentation

## 2011-07-27 NOTE — Progress Notes (Signed)
Cardiac Rehab Medication Review by a Pharmacist  Does the patient  feel that his/her medications are working for him/her?  yes  Has the patient been experiencing any side effects to the medications prescribed?  no  Does the patient measure his/her own blood pressure or blood glucose at home?  yes   Does the patient have any problems obtaining medications due to transportation or finances?   no  Understanding of regimen: good Understanding of indications: good Potential of compliance: good    Pharmacist comments: Was having dizziness with bystolic; adjusted dose, but still dizzy.  Dizziness eventually resolved on its own.  Maybe misses a dose about once a month.    [name] 07/27/2011 8:22 AM

## 2011-07-31 ENCOUNTER — Encounter (HOSPITAL_COMMUNITY)
Admission: RE | Admit: 2011-07-31 | Discharge: 2011-07-31 | Disposition: A | Discharge status: Home / Self Care | Payer: Medicare Other | Source: Ambulatory Visit | Attending: Interventional Cardiology | Admitting: Interventional Cardiology

## 2011-07-31 NOTE — Progress Notes (Signed)
Pt started cardiac rehab today.  Pt tolerated light exercise without difficulty. Telemetry rhythm Sinus without ectopy. [name] reported having some soreness at home in her shoulders especially on the right and sometimes in her neck. Evalene thinks the soreness may be from opening and closing her sliding glass door at home. No complains of soreness today at exercise. Tyerra was encouraged to follow up with Dr Waynard Edwards if she continues to have problems with shoulder soreness. Will continue to monitor the patient throughout  the program.

## 2011-08-02 ENCOUNTER — Encounter (HOSPITAL_COMMUNITY)
Admission: RE | Admit: 2011-08-02 | Discharge: 2011-08-02 | Disposition: A | Discharge status: Home / Self Care | Payer: Medicare Other | Source: Ambulatory Visit | Attending: Interventional Cardiology | Admitting: Interventional Cardiology

## 2011-08-02 DIAGNOSIS — F329 Major depressive disorder, single episode, unspecified: Secondary | ICD-10-CM | POA: Insufficient documentation

## 2011-08-02 DIAGNOSIS — I2510 Atherosclerotic heart disease of native coronary artery without angina pectoris: Secondary | ICD-10-CM | POA: Insufficient documentation

## 2011-08-02 DIAGNOSIS — E039 Hypothyroidism, unspecified: Secondary | ICD-10-CM | POA: Insufficient documentation

## 2011-08-02 DIAGNOSIS — Z880 Allergy status to penicillin: Secondary | ICD-10-CM | POA: Insufficient documentation

## 2011-08-02 DIAGNOSIS — Z951 Presence of aortocoronary bypass graft: Secondary | ICD-10-CM | POA: Insufficient documentation

## 2011-08-02 DIAGNOSIS — E785 Hyperlipidemia, unspecified: Secondary | ICD-10-CM | POA: Insufficient documentation

## 2011-08-02 DIAGNOSIS — Z5189 Encounter for other specified aftercare: Secondary | ICD-10-CM | POA: Insufficient documentation

## 2011-08-02 DIAGNOSIS — K219 Gastro-esophageal reflux disease without esophagitis: Secondary | ICD-10-CM | POA: Insufficient documentation

## 2011-08-02 DIAGNOSIS — Z7982 Long term (current) use of aspirin: Secondary | ICD-10-CM | POA: Insufficient documentation

## 2011-08-02 DIAGNOSIS — I10 Essential (primary) hypertension: Secondary | ICD-10-CM | POA: Insufficient documentation

## 2011-08-02 DIAGNOSIS — I498 Other specified cardiac arrhythmias: Secondary | ICD-10-CM | POA: Insufficient documentation

## 2011-08-02 DIAGNOSIS — F3289 Other specified depressive episodes: Secondary | ICD-10-CM | POA: Insufficient documentation

## 2011-08-02 DIAGNOSIS — Z886 Allergy status to analgesic agent status: Secondary | ICD-10-CM | POA: Insufficient documentation

## 2011-08-02 NOTE — Progress Notes (Signed)
[name] 71 y.o. female       Nutrition Screen                                                                    YES  NO Do you live in a nursing home?  X   Do you eat out more than 3 times/week?    X If yes, how many times per week do you eat out?  Do you have food allergies?   X If yes, what are you allergic to?  Have you gained or lost more than 10 lbs without trying?               X If yes, how much weight have you lost and over what time period?  lbs gained or lost over  weeks/month  Do you want to lose weight?    X  If yes, what is a goal weight or amount of weight you would like to lose?  lb  Do you eat alone most of the time?  X    Do you eat less than 2 meals/day?  X If yes, how many meals do you eat?  Do you drink more than 3 alcohol drinks/day?  X If yes, how many drinks per day?  Are you having trouble with constipation? *  X If yes, what are you doing to help relieve constipation?  Do you have financial difficulties with buying food?*    X   Are you experiencing regular nausea/ vomiting?*     X   Do you have a poor appetite? *                                        X   Do you have trouble chewing/swallowing? *   X    Pt with diagnoses of:  X CABG            X GERD          X Dyslipidemia  / HDL< 40 / LDL>70 / High TG      X %  Body fat >goal / Body Mass Index >25 X HTN / BP >120/80       Pt Risk Score   2       Diagnosis Risk Score  25       Total Risk Score   27                         High Risk               X Low Risk    HT: 64.25" Ht Readings from Last 1 Encounters:  07/26/11 5\' 4"  (1.626 m)    WT:   195.5 lb (88.9 kg) Wt Readings from Last 3 Encounters:  07/26/11 197 lb (89.359 kg)  07/09/11 195 lb 1.7 oz (88.5 kg)  06/30/11 209 lb 3.5 oz (94.9 kg)     IBW 55 161%IBW BMI 33.4 43%body fat  Meds reviewed: Probiotic, vitamin B12, vitamin D Past Medical History  Diagnosis Date  . Complication of anesthesia     addison's disease  causes  hypotension after any surgery .  last knee surgery dr. Berton Lan gave large dose of hydrocortisal and avoided the hypotensive side effectas of addison's disease.  [name] PONV (postoperative nausea and vomiting)   . Coronary artery disease   . Angina   . Hypertension   . Dysrhythmia   . Shortness of breath   . Recurrent upper respiratory infection (URI)   . Hypothyroidism   . GERD (gastroesophageal reflux disease)   . Arthritis   . Anxiety   . Depression   . Addison's disease        Activity level: Pt is active  Wt goal: 172-184 lb ( 78.2-83.6 kg) Current tobacco use? No Food/Drug Interaction? No Labs:  Lipid Panel  No results found for this basename: chol, trig, hdl, cholhdl, vldl, ldlcalc   No results found for this basename: HGBA1C  LDL goal: < 100      MI, DM, Carotid or PVD and > 2:      HTN, >72 yo female,  Estimated Daily Nutrition Needs for: ? wt loss  1350-1600 Kcal , Total Fat 35-40gm, Saturated Fat 10-12 gm, Trans Fat 1.3-1.6 gm,  Sodium less than 1500 mg

## 2011-08-02 NOTE — Progress Notes (Signed)
[name] reports her shoulder discomfort is getting better.  No complaints during exercise today.

## 2011-08-04 ENCOUNTER — Encounter (HOSPITAL_COMMUNITY)
Admission: RE | Admit: 2011-08-04 | Discharge: 2011-08-04 | Disposition: A | Discharge status: Home / Self Care | Payer: Medicare Other | Source: Ambulatory Visit | Attending: Interventional Cardiology | Admitting: Interventional Cardiology

## 2011-08-07 ENCOUNTER — Encounter (HOSPITAL_COMMUNITY)
Admission: RE | Admit: 2011-08-07 | Discharge: 2011-08-07 | Disposition: A | Discharge status: Home / Self Care | Payer: Medicare Other | Source: Ambulatory Visit | Attending: Interventional Cardiology | Admitting: Interventional Cardiology

## 2011-08-09 ENCOUNTER — Encounter (HOSPITAL_COMMUNITY)
Admission: RE | Admit: 2011-08-09 | Discharge: 2011-08-09 | Disposition: A | Discharge status: Home / Self Care | Payer: Medicare Other | Source: Ambulatory Visit | Attending: Interventional Cardiology | Admitting: Interventional Cardiology

## 2011-08-11 ENCOUNTER — Encounter (HOSPITAL_COMMUNITY)
Admission: RE | Admit: 2011-08-11 | Discharge: 2011-08-11 | Disposition: A | Discharge status: Home / Self Care | Payer: Medicare Other | Source: Ambulatory Visit | Attending: Interventional Cardiology | Admitting: Interventional Cardiology

## 2011-08-11 NOTE — Progress Notes (Signed)
Reviewed home exercise with pt today.  Pt plans to wal and to return to the [facility] for exercise.  Pt hopes to return to water waling class after rehab.  Reviewed THR, pulse, RPE, sign and symptoms, NTG use, and when to call 911 or MD.  Pt voiced understanding. Fabio Pierce, MA, ACSM RCEP

## 2011-08-14 ENCOUNTER — Encounter (HOSPITAL_COMMUNITY)
Admission: RE | Admit: 2011-08-14 | Discharge: 2011-08-14 | Disposition: A | Discharge status: Home / Self Care | Payer: Medicare Other | Source: Ambulatory Visit | Attending: Interventional Cardiology | Admitting: Interventional Cardiology

## 2011-08-14 NOTE — Progress Notes (Signed)
[name] 71 y.o. female Nutrition Note Spoke with pt.  Nutrition Survey reviewed with pt. Pt is following Step 2 of the Therapeutic Lifestyle Changes diet. Pt consumed 2 servings of fruit and veggies on her 24 hour food survey. Increasing fruit and veggie intake encouraged. Pt reports she is frustrated because she is on oral steroids for her Addison's disease and is unable to lose wt. Pt states she has discussed this issue with her PCP and her Endocrinologist. Pt reports her physical activity is limited due to back and knee issues. Keeping a food journal and calorie counting discussed. Pt expressed understanding.  Nutrition Diagnosis   Food-and nutrition-related knowledge deficit related to lack of exposure to information as related to diagnosis of: ? 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   Pt to attend the Portion Distortion class   Pt to attend the  ? Nutrition I class                         ? Nutrition II class   Continue client-centered nutrition education by RD, as part of interdisciplinary care. Goal(s)   Pt to identify food quantities necessary to achieve: ? wt loss to a goal wt of 172-184 lb (78.2-83.6 kg) at graduation from cardiac rehab.    Pt to describe the benefit of including fruits, vegetables, whole grains, and low-fat dairy products in a heart healthy meal plan. Monitor and Evaluate progress toward nutrition goal with team.

## 2011-08-16 ENCOUNTER — Encounter (HOSPITAL_COMMUNITY)
Admission: RE | Admit: 2011-08-16 | Discharge: 2011-08-16 | Disposition: A | Discharge status: Home / Self Care | Payer: Medicare Other | Source: Ambulatory Visit | Attending: Interventional Cardiology | Admitting: Interventional Cardiology

## 2011-08-16 NOTE — Progress Notes (Signed)
[name] 71 y.o. female Nutrition Note Spoke with pt.  Nutrition Plan and cholesterol goals reviewed with pt. Per discussion with pt, pt is going to try to maintain her wt given chronic use of oral hyrocortisone for her Addison's disease.  Nutrition Diagnosis   Food-and nutrition-related knowledge deficit related to lack of exposure to information as related to diagnosis of: ? CVD   Obesity related to excessive energy intake as evidenced by a BMI of 33.4  Re-Estimated Daily Nutrition Needs for: wt maintenance 1950-2200 Kcal, 65-70 gm fat, 14-17 gm sat fat, 2.1-2.5 gm trans-fat, <1500 mg sodium  Nutrition Intervention   Nutrition Plan and cholesterol goals reviewed with pt.   Pt to attend the Portion Distortion class   Pt to attend the  ? Nutrition I class                         ? Nutrition II class   Continue client-centered nutrition education by RD, as part of interdisciplinary care. Goal(s)   Pt to identify food quantities necessary to achieve: ? wt loss to a goal wt of 172-184 lb (78.2-83.6 kg) at graduation from cardiac rehab.    Pt to describe the benefit of including fruits, vegetables, whole grains, and low-fat dairy products in a heart healthy meal plan. Monitor and Evaluate progress toward nutrition goal with team.

## 2011-08-16 NOTE — Progress Notes (Signed)
Harriett Sine say Dr Waynard Edwards yesterday. Jaela said her Zetia was increased to four times a week. Anzal is continuing to take flexeril as needed for shoulder soreness.

## 2011-08-18 ENCOUNTER — Encounter (HOSPITAL_COMMUNITY)
Admission: RE | Admit: 2011-08-18 | Discharge: 2011-08-18 | Disposition: A | Discharge status: Home / Self Care | Payer: Medicare Other | Source: Ambulatory Visit | Attending: Interventional Cardiology | Admitting: Interventional Cardiology

## 2011-08-21 ENCOUNTER — Encounter (HOSPITAL_COMMUNITY)
Admission: RE | Admit: 2011-08-21 | Discharge: 2011-08-21 | Disposition: A | Discharge status: Home / Self Care | Payer: Medicare Other | Source: Ambulatory Visit | Attending: Interventional Cardiology | Admitting: Interventional Cardiology

## 2011-08-21 NOTE — Progress Notes (Signed)
Luretha Rued 71 y.o. female Nutrition Note Spoke with pt re: ? Calcium supplementation with chronic use of oral hyrocortisone. Pt reports she has taken calcium in the past and is not currently taking a calcium supplement. Pt restarted vitamin D supplement per MD recommendation last week. Pt states she has a Dexa scan scheduled in June and has never been dx with osteopenia/osteoporosis. Pt encouraged to discuss calcium supplementation with her endocrinologist. Pt expressed understanding.  Nutrition Diagnosis   Food-and nutrition-related knowledge deficit related to lack of exposure to information as related to diagnosis of: ? CVD   Obesity related to excessive energy intake as evidenced by a BMI of 33.4  Re-Estimated Daily Nutrition Needs for: wt maintenance 1950-2200 Kcal, 65-70 gm fat, 14-17 gm sat fat, 2.1-2.5 gm trans-fat, <1500 mg sodium  Nutrition Intervention   Pt to discuss calcium supplementation with her endocrinologist.   Continue client-centered nutrition education by RD, as part of interdisciplinary care. Goal(s)   Pt to identify food quantities necessary to achieve: ? wt loss to a goal wt of 172-184 lb (78.2-83.6 kg) at graduation from cardiac rehab.    Pt to describe the benefit of including fruits, vegetables, whole grains, and low-fat dairy products in a heart healthy meal plan. Monitor and Evaluate progress toward nutrition goal with team.

## 2011-08-23 ENCOUNTER — Encounter (HOSPITAL_COMMUNITY)
Admission: RE | Admit: 2011-08-23 | Discharge: 2011-08-23 | Disposition: A | Discharge status: Home / Self Care | Payer: Medicare Other | Source: Ambulatory Visit | Attending: Interventional Cardiology | Admitting: Interventional Cardiology

## 2011-08-25 ENCOUNTER — Encounter (HOSPITAL_COMMUNITY)
Admission: RE | Admit: 2011-08-25 | Discharge: 2011-08-25 | Disposition: A | Discharge status: Home / Self Care | Payer: Medicare Other | Source: Ambulatory Visit | Attending: Interventional Cardiology | Admitting: Interventional Cardiology

## 2011-08-30 ENCOUNTER — Telehealth (HOSPITAL_COMMUNITY): Payer: Self-pay | Admitting: *Deleted

## 2011-08-30 ENCOUNTER — Encounter (HOSPITAL_COMMUNITY)
Admission: RE | Admit: 2011-08-30 | Discharge: 2011-08-30 | Disposition: A | Discharge status: Home / Self Care | Payer: Medicare Other | Source: Ambulatory Visit | Attending: Interventional Cardiology | Admitting: Interventional Cardiology

## 2011-08-30 NOTE — Progress Notes (Signed)
[name] has reported having intermittent indigestion since her open heart surgery.  [name] takes omeprazole once a day. Dr Perini's office called on the patients behalf.  Left message with [name] to call the patient. [name] said she blew some leaves and has some soreness in her right shoulder. [name] ddi light exercise today with minimal  arm movement.  No complaints of indigestion today during exercise. Will continue to monitor the patient throughout  the program.

## 2011-09-01 ENCOUNTER — Encounter (HOSPITAL_COMMUNITY)
Admission: RE | Admit: 2011-09-01 | Discharge: 2011-09-01 | Disposition: A | Discharge status: Home / Self Care | Payer: Medicare Other | Source: Ambulatory Visit | Attending: Interventional Cardiology | Admitting: Interventional Cardiology

## 2011-09-04 ENCOUNTER — Encounter (HOSPITAL_COMMUNITY)
Admission: RE | Admit: 2011-09-04 | Discharge: 2011-09-04 | Disposition: A | Discharge status: Home / Self Care | Payer: Medicare Other | Source: Ambulatory Visit | Attending: Interventional Cardiology | Admitting: Interventional Cardiology

## 2011-09-04 DIAGNOSIS — I10 Essential (primary) hypertension: Secondary | ICD-10-CM | POA: Insufficient documentation

## 2011-09-04 DIAGNOSIS — Z5189 Encounter for other specified aftercare: Secondary | ICD-10-CM | POA: Insufficient documentation

## 2011-09-04 DIAGNOSIS — Z7982 Long term (current) use of aspirin: Secondary | ICD-10-CM | POA: Insufficient documentation

## 2011-09-04 DIAGNOSIS — Z880 Allergy status to penicillin: Secondary | ICD-10-CM | POA: Insufficient documentation

## 2011-09-04 DIAGNOSIS — F3289 Other specified depressive episodes: Secondary | ICD-10-CM | POA: Insufficient documentation

## 2011-09-04 DIAGNOSIS — E039 Hypothyroidism, unspecified: Secondary | ICD-10-CM | POA: Insufficient documentation

## 2011-09-04 DIAGNOSIS — I498 Other specified cardiac arrhythmias: Secondary | ICD-10-CM | POA: Insufficient documentation

## 2011-09-04 DIAGNOSIS — Z951 Presence of aortocoronary bypass graft: Secondary | ICD-10-CM | POA: Insufficient documentation

## 2011-09-04 DIAGNOSIS — I2510 Atherosclerotic heart disease of native coronary artery without angina pectoris: Secondary | ICD-10-CM | POA: Insufficient documentation

## 2011-09-04 DIAGNOSIS — F329 Major depressive disorder, single episode, unspecified: Secondary | ICD-10-CM | POA: Insufficient documentation

## 2011-09-04 DIAGNOSIS — K219 Gastro-esophageal reflux disease without esophagitis: Secondary | ICD-10-CM | POA: Insufficient documentation

## 2011-09-04 DIAGNOSIS — E785 Hyperlipidemia, unspecified: Secondary | ICD-10-CM | POA: Insufficient documentation

## 2011-09-04 DIAGNOSIS — Z886 Allergy status to analgesic agent status: Secondary | ICD-10-CM | POA: Insufficient documentation

## 2011-09-04 NOTE — Progress Notes (Signed)
[name] reported having some intermittent palpitations over the weekend. [name] was noted to have an occasional PAC other wise no other ectopy noted. [name] was asymptomatic today with exercise.  [name] reports that her indigestion is better since she has started taking Zantac over the counter.  Will fax exercise flow sheets for review  To Dr Katrinka Blazing with ECG tracings.  [name] also reports having some soreness in bilateral shoulders and upper chest.  [name] plans to follow up with the surgeon's office. No new orders received from Dr Michaelle Copas office.  Ecg tracings reviewed by Cristopher Peru ANP.

## 2011-09-06 ENCOUNTER — Encounter (HOSPITAL_COMMUNITY)
Admission: RE | Admit: 2011-09-06 | Discharge: 2011-09-06 | Disposition: A | Discharge status: Home / Self Care | Payer: Medicare Other | Source: Ambulatory Visit | Attending: Interventional Cardiology | Admitting: Interventional Cardiology

## 2011-09-06 NOTE — Progress Notes (Signed)
Pt with anxiety about returning back to [facility].  While on the Recumbent bike pt bp increased to 180/78.  BP returned to her normal range after emotional support and reassurrance given to pt. Pt able to continue with exercise with no further elevation in BP. Will continue to offer support and encouragement as needed.

## 2011-09-08 ENCOUNTER — Encounter (HOSPITAL_COMMUNITY)
Admission: RE | Admit: 2011-09-08 | Discharge: 2011-09-08 | Disposition: A | Discharge status: Home / Self Care | Payer: Medicare Other | Source: Ambulatory Visit | Attending: Interventional Cardiology | Admitting: Interventional Cardiology

## 2011-09-11 ENCOUNTER — Encounter (HOSPITAL_COMMUNITY)
Admission: RE | Admit: 2011-09-11 | Discharge: 2011-09-11 | Disposition: A | Discharge status: Home / Self Care | Payer: Medicare Other | Source: Ambulatory Visit | Attending: Interventional Cardiology | Admitting: Interventional Cardiology

## 2011-09-13 ENCOUNTER — Encounter (HOSPITAL_COMMUNITY)
Admission: RE | Admit: 2011-09-13 | Discharge: 2011-09-13 | Disposition: A | Discharge status: Home / Self Care | Payer: Medicare Other | Source: Ambulatory Visit | Attending: Interventional Cardiology | Admitting: Interventional Cardiology

## 2011-09-13 NOTE — Progress Notes (Addendum)
Patient is complaining of having leg cramps and thinks her potassium level is low.  Telemetry rhythm Sinus without ectopy. Dr Michaelle Copas office called and notified . Marjean Donna wants Mrs. Kendle to follow up with her primary care provider.  Harriett Sine spoke with Marchelle Folks at Dr Perini's office. Milaina has an appointment with the PA today at Dr Perini's office today. Will fax exercise flow sheets to Dr Perini's office for review.

## 2011-09-15 ENCOUNTER — Encounter (HOSPITAL_COMMUNITY)
Admission: RE | Admit: 2011-09-15 | Discharge: 2011-09-15 | Disposition: A | Discharge status: Home / Self Care | Payer: Medicare Other | Source: Ambulatory Visit | Attending: Interventional Cardiology | Admitting: Interventional Cardiology

## 2011-09-15 NOTE — Progress Notes (Addendum)
Rachell saw the PA at Dr Perini's office on Wednesday.  Karlen was told her K level was 3.4 .  Dr Waynard Edwards does not want to p[lace the patient on a potasium supplement at this time.  The dietitian  gave the patient a handout on potassium plentiful foods.

## 2011-09-15 NOTE — Progress Notes (Signed)
Nutrition Brief Note Spoke with pt. Pt reports her Potassium 09/14/11 was " 3.4, which is on the low end of normal." Pt requested information re: potassium content of food. Pt educated re: potassium content of various foods. Potassium Handout given. Pt expressed understanding. Continue client-centered nutrition education by RD as part of interdisciplinary care.  Monitor and evaluate progress toward nutrition goal with team.

## 2011-09-18 ENCOUNTER — Encounter (HOSPITAL_COMMUNITY)
Admission: RE | Admit: 2011-09-18 | Discharge: 2011-09-18 | Disposition: A | Discharge status: Home / Self Care | Payer: Medicare Other | Source: Ambulatory Visit | Attending: Interventional Cardiology | Admitting: Interventional Cardiology

## 2011-09-18 NOTE — Progress Notes (Signed)
Pt with chest wall soreness.  Equipment modified for comfort.  Pt able to complete exercise with no additional complaints.

## 2011-09-20 ENCOUNTER — Encounter (HOSPITAL_COMMUNITY)
Admission: RE | Admit: 2011-09-20 | Discharge: 2011-09-20 | Disposition: A | Discharge status: Home / Self Care | Payer: Medicare Other | Source: Ambulatory Visit | Attending: Interventional Cardiology | Admitting: Interventional Cardiology

## 2011-09-22 ENCOUNTER — Encounter (HOSPITAL_COMMUNITY)
Admission: RE | Admit: 2011-09-22 | Discharge: 2011-09-22 | Disposition: A | Discharge status: Home / Self Care | Payer: Medicare Other | Source: Ambulatory Visit | Attending: Interventional Cardiology | Admitting: Interventional Cardiology

## 2011-09-25 ENCOUNTER — Encounter (HOSPITAL_COMMUNITY)
Admission: RE | Admit: 2011-09-25 | Discharge: 2011-09-25 | Disposition: A | Discharge status: Home / Self Care | Payer: Medicare Other | Source: Ambulatory Visit | Attending: Interventional Cardiology | Admitting: Interventional Cardiology

## 2011-09-27 ENCOUNTER — Encounter (HOSPITAL_COMMUNITY)
Admission: RE | Admit: 2011-09-27 | Discharge: 2011-09-27 | Disposition: A | Discharge status: Home / Self Care | Payer: Medicare Other | Source: Ambulatory Visit | Attending: Interventional Cardiology | Admitting: Interventional Cardiology

## 2011-09-29 ENCOUNTER — Encounter (HOSPITAL_COMMUNITY): Payer: Medicare Other

## 2011-10-02 ENCOUNTER — Encounter (HOSPITAL_COMMUNITY)
Admission: RE | Admit: 2011-10-02 | Discharge: 2011-10-02 | Disposition: A | Discharge status: Home / Self Care | Payer: Medicare Other | Source: Ambulatory Visit | Attending: Interventional Cardiology | Admitting: Interventional Cardiology

## 2011-10-02 ENCOUNTER — Telehealth (HOSPITAL_COMMUNITY): Payer: Self-pay | Admitting: *Deleted

## 2011-10-02 DIAGNOSIS — Z951 Presence of aortocoronary bypass graft: Secondary | ICD-10-CM | POA: Insufficient documentation

## 2011-10-02 DIAGNOSIS — F329 Major depressive disorder, single episode, unspecified: Secondary | ICD-10-CM | POA: Insufficient documentation

## 2011-10-02 DIAGNOSIS — F3289 Other specified depressive episodes: Secondary | ICD-10-CM | POA: Insufficient documentation

## 2011-10-02 DIAGNOSIS — Z886 Allergy status to analgesic agent status: Secondary | ICD-10-CM | POA: Insufficient documentation

## 2011-10-02 DIAGNOSIS — Z7982 Long term (current) use of aspirin: Secondary | ICD-10-CM | POA: Insufficient documentation

## 2011-10-02 DIAGNOSIS — K219 Gastro-esophageal reflux disease without esophagitis: Secondary | ICD-10-CM | POA: Insufficient documentation

## 2011-10-02 DIAGNOSIS — E039 Hypothyroidism, unspecified: Secondary | ICD-10-CM | POA: Insufficient documentation

## 2011-10-02 DIAGNOSIS — E785 Hyperlipidemia, unspecified: Secondary | ICD-10-CM | POA: Insufficient documentation

## 2011-10-02 DIAGNOSIS — Z880 Allergy status to penicillin: Secondary | ICD-10-CM | POA: Insufficient documentation

## 2011-10-02 DIAGNOSIS — Z5189 Encounter for other specified aftercare: Secondary | ICD-10-CM | POA: Insufficient documentation

## 2011-10-02 DIAGNOSIS — I2510 Atherosclerotic heart disease of native coronary artery without angina pectoris: Secondary | ICD-10-CM | POA: Insufficient documentation

## 2011-10-02 DIAGNOSIS — I498 Other specified cardiac arrhythmias: Secondary | ICD-10-CM | POA: Insufficient documentation

## 2011-10-02 DIAGNOSIS — I10 Essential (primary) hypertension: Secondary | ICD-10-CM | POA: Insufficient documentation

## 2011-10-02 NOTE — Progress Notes (Signed)
[name] has been complaining of left scapula pain for the past three weeks and intermittent jaw soreness.  Dr Michaelle Copas office called notified.  [name] has an appointment to see Cristopher Peru ANP .  Patient given exercise flow sheets to take to Dr Michaelle Copas office.

## 2011-10-04 ENCOUNTER — Encounter (HOSPITAL_COMMUNITY): Payer: Medicare Other

## 2011-10-06 ENCOUNTER — Encounter (HOSPITAL_COMMUNITY): Payer: Medicare Other

## 2011-10-09 ENCOUNTER — Encounter (HOSPITAL_COMMUNITY): Payer: Medicare Other

## 2011-10-11 ENCOUNTER — Encounter (HOSPITAL_COMMUNITY): Payer: Medicare Other

## 2011-10-13 ENCOUNTER — Encounter (HOSPITAL_COMMUNITY): Payer: Medicare Other

## 2011-10-16 ENCOUNTER — Encounter (HOSPITAL_COMMUNITY)
Admission: RE | Admit: 2011-10-16 | Discharge: 2011-10-16 | Disposition: A | Discharge status: Home / Self Care | Payer: Medicare Other | Source: Ambulatory Visit | Attending: Interventional Cardiology | Admitting: Interventional Cardiology

## 2011-10-17 NOTE — Progress Notes (Signed)
Tigerlily returned to cardiac rehab yesterday per Dr Katrinka Blazing her Imdur was discontinued.  No complaints during exercise yesterday. Will continue to monitor the patient throughout  the program.

## 2011-10-18 ENCOUNTER — Encounter (HOSPITAL_COMMUNITY)
Admission: RE | Admit: 2011-10-18 | Discharge: 2011-10-18 | Disposition: A | Discharge status: Home / Self Care | Payer: Medicare Other | Source: Ambulatory Visit | Attending: Interventional Cardiology | Admitting: Interventional Cardiology

## 2011-10-20 ENCOUNTER — Encounter (HOSPITAL_COMMUNITY)
Admission: RE | Admit: 2011-10-20 | Discharge: 2011-10-20 | Disposition: A | Discharge status: Home / Self Care | Payer: Medicare Other | Source: Ambulatory Visit | Attending: Interventional Cardiology | Admitting: Interventional Cardiology

## 2011-10-23 ENCOUNTER — Encounter (HOSPITAL_COMMUNITY)
Admission: RE | Admit: 2011-10-23 | Discharge: 2011-10-23 | Disposition: A | Discharge status: Home / Self Care | Payer: Medicare Other | Source: Ambulatory Visit | Attending: Interventional Cardiology | Admitting: Interventional Cardiology

## 2011-10-25 ENCOUNTER — Encounter (HOSPITAL_COMMUNITY)
Admission: RE | Admit: 2011-10-25 | Discharge: 2011-10-25 | Disposition: A | Discharge status: Home / Self Care | Payer: Medicare Other | Source: Ambulatory Visit | Attending: Interventional Cardiology | Admitting: Interventional Cardiology

## 2011-10-27 ENCOUNTER — Encounter (HOSPITAL_COMMUNITY)
Admission: RE | Admit: 2011-10-27 | Discharge: 2011-10-27 | Disposition: A | Discharge status: Home / Self Care | Payer: Medicare Other | Source: Ambulatory Visit | Attending: Interventional Cardiology | Admitting: Interventional Cardiology

## 2011-10-30 ENCOUNTER — Encounter (HOSPITAL_COMMUNITY)
Admission: RE | Admit: 2011-10-30 | Discharge: 2011-10-30 | Disposition: A | Discharge status: Home / Self Care | Payer: Medicare Other | Source: Ambulatory Visit | Attending: Interventional Cardiology | Admitting: Interventional Cardiology

## 2011-11-01 ENCOUNTER — Encounter (HOSPITAL_COMMUNITY)
Admission: RE | Admit: 2011-11-01 | Discharge: 2011-11-01 | Disposition: A | Discharge status: Home / Self Care | Payer: Medicare Other | Source: Ambulatory Visit | Attending: Interventional Cardiology | Admitting: Interventional Cardiology

## 2011-11-03 ENCOUNTER — Encounter (HOSPITAL_COMMUNITY)
Admission: RE | Admit: 2011-11-03 | Discharge: 2011-11-03 | Disposition: A | Discharge status: Home / Self Care | Payer: Medicare Other | Source: Ambulatory Visit | Attending: Interventional Cardiology | Admitting: Interventional Cardiology

## 2011-11-03 DIAGNOSIS — F3289 Other specified depressive episodes: Secondary | ICD-10-CM | POA: Insufficient documentation

## 2011-11-03 DIAGNOSIS — Z886 Allergy status to analgesic agent status: Secondary | ICD-10-CM | POA: Insufficient documentation

## 2011-11-03 DIAGNOSIS — K219 Gastro-esophageal reflux disease without esophagitis: Secondary | ICD-10-CM | POA: Insufficient documentation

## 2011-11-03 DIAGNOSIS — I2510 Atherosclerotic heart disease of native coronary artery without angina pectoris: Secondary | ICD-10-CM | POA: Insufficient documentation

## 2011-11-03 DIAGNOSIS — Z7982 Long term (current) use of aspirin: Secondary | ICD-10-CM | POA: Insufficient documentation

## 2011-11-03 DIAGNOSIS — I10 Essential (primary) hypertension: Secondary | ICD-10-CM | POA: Insufficient documentation

## 2011-11-03 DIAGNOSIS — F329 Major depressive disorder, single episode, unspecified: Secondary | ICD-10-CM | POA: Insufficient documentation

## 2011-11-03 DIAGNOSIS — Z5189 Encounter for other specified aftercare: Secondary | ICD-10-CM | POA: Insufficient documentation

## 2011-11-03 DIAGNOSIS — E785 Hyperlipidemia, unspecified: Secondary | ICD-10-CM | POA: Insufficient documentation

## 2011-11-03 DIAGNOSIS — Z880 Allergy status to penicillin: Secondary | ICD-10-CM | POA: Insufficient documentation

## 2011-11-03 DIAGNOSIS — I498 Other specified cardiac arrhythmias: Secondary | ICD-10-CM | POA: Insufficient documentation

## 2011-11-03 DIAGNOSIS — Z951 Presence of aortocoronary bypass graft: Secondary | ICD-10-CM | POA: Insufficient documentation

## 2011-11-03 DIAGNOSIS — E039 Hypothyroidism, unspecified: Secondary | ICD-10-CM | POA: Insufficient documentation

## 2011-11-06 ENCOUNTER — Encounter (HOSPITAL_COMMUNITY)
Admission: RE | Admit: 2011-11-06 | Discharge: 2011-11-06 | Disposition: A | Discharge status: Home / Self Care | Payer: Medicare Other | Source: Ambulatory Visit | Attending: Interventional Cardiology | Admitting: Interventional Cardiology

## 2011-11-06 NOTE — Progress Notes (Signed)
Tinsleigh complained of bilateral knee pain and left arm pain during bike testing today.  Blood pressure and ECG tracing stable.  Sperry's complaints resolved after bike testing complete. Eleen used the bike test bike that she does not usually use. Brittnie also complained of noted a hear rate of 45 and 51 with her blood pressure monitor. Hear rate at cardiac rehab today noted between 75-102 Sinus rhythm, Sinus Tach. Will fax exercise flow sheets to Dr. Michaelle Copas office for review with ECG tracing and exercise flow sheets. Waneda had no complaints upon exit from cardiac rehab. Will continue to monitor the patient throughout  the program. Harriett Sine graduates on Wed and she plans to participate in water aerobics.

## 2011-11-08 ENCOUNTER — Encounter (HOSPITAL_COMMUNITY)
Admission: RE | Admit: 2011-11-08 | Discharge: 2011-11-08 | Disposition: A | Discharge status: Home / Self Care | Payer: Medicare Other | Source: Ambulatory Visit | Attending: Interventional Cardiology | Admitting: Interventional Cardiology

## 2011-11-08 NOTE — Progress Notes (Signed)
[name] 71 y.o. female Nutrition Note Spoke with pt. Pt is graduating today and is now interested in losing wt despite being on hydrocortisone. Pt educated re: wt loss on prednisone and a 1500 kcal diet. Pt again encouraged to keep a food journal or use an on-line food journal (e.g. [employer]). Pt states, "they've told me to keep a food journal with every diet I've ever done." Pt encouraged to exercise most days of the week according to Fabio Pierce, EP, previous home-exercise instructions.   Nutrition Diagnosis   Food-and nutrition-related knowledge deficit related to lack of exposure to information as related to diagnosis of: ? 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 Goal(s)   Pt to identify food quantities necessary to achieve: ? wt loss to a goal wt of 172-184 lb (78.2-83.6 kg) at graduation from cardiac rehab.    Pt to describe the benefit of including fruits, vegetables, whole grains, and low-fat dairy products in a heart healthy meal plan. Monitor and Evaluate progress toward nutrition goal with team.

## 2011-11-10 ENCOUNTER — Encounter (HOSPITAL_COMMUNITY): Payer: Medicare Other

## 2011-11-13 ENCOUNTER — Encounter (HOSPITAL_COMMUNITY): Payer: Medicare Other

## 2011-11-15 ENCOUNTER — Encounter (HOSPITAL_COMMUNITY): Payer: Medicare Other

## 2011-11-17 ENCOUNTER — Encounter (HOSPITAL_COMMUNITY): Payer: Medicare Other

## 2011-11-20 ENCOUNTER — Encounter (HOSPITAL_COMMUNITY): Payer: Medicare Other

## 2011-11-22 ENCOUNTER — Encounter (HOSPITAL_COMMUNITY): Payer: Medicare Other

## 2011-11-24 ENCOUNTER — Encounter (HOSPITAL_COMMUNITY): Payer: Medicare Other

## 2011-11-27 ENCOUNTER — Encounter (HOSPITAL_COMMUNITY): Payer: Medicare Other

## 2011-11-29 ENCOUNTER — Encounter (HOSPITAL_COMMUNITY): Payer: Medicare Other

## 2011-12-01 ENCOUNTER — Encounter (HOSPITAL_COMMUNITY): Payer: Medicare Other

## 2011-12-26 ENCOUNTER — Encounter: Payer: Self-pay | Admitting: Gastroenterology

## 2012-01-11 ENCOUNTER — Other Ambulatory Visit: Payer: Self-pay | Admitting: Internal Medicine

## 2012-01-11 DIAGNOSIS — R911 Solitary pulmonary nodule: Secondary | ICD-10-CM

## 2012-01-15 ENCOUNTER — Ambulatory Visit
Admission: RE | Admit: 2012-01-15 | Discharge: 2012-01-15 | Disposition: A | Discharge status: Home / Self Care | Payer: Medicare Other | Source: Ambulatory Visit | Attending: Internal Medicine | Admitting: Internal Medicine

## 2012-01-15 DIAGNOSIS — R911 Solitary pulmonary nodule: Secondary | ICD-10-CM

## 2012-01-15 MED ORDER — IOHEXOL 300 MG/ML  SOLN: 75.0000 mL | Freq: Once | INTRAMUSCULAR | Status: AC | PRN

## 2012-01-29 ENCOUNTER — Other Ambulatory Visit (HOSPITAL_COMMUNITY): Payer: Self-pay | Admitting: *Deleted

## 2012-01-31 ENCOUNTER — Encounter (HOSPITAL_COMMUNITY): Admission: RE | Admit: 2012-01-31 | Payer: Medicare Other | Source: Ambulatory Visit

## 2012-02-28 ENCOUNTER — Other Ambulatory Visit (HOSPITAL_COMMUNITY): Payer: Self-pay | Admitting: Internal Medicine

## 2012-03-04 ENCOUNTER — Encounter (HOSPITAL_COMMUNITY): Admission: RE | Admit: 2012-03-04 | Payer: Medicare Other | Source: Ambulatory Visit

## 2012-03-15 ENCOUNTER — Encounter (HOSPITAL_COMMUNITY): Payer: Self-pay

## 2012-03-15 ENCOUNTER — Ambulatory Visit (HOSPITAL_COMMUNITY)
Admission: RE | Admit: 2012-03-15 | Discharge: 2012-03-15 | Disposition: A | Discharge status: Home / Self Care | Payer: Medicare Other | Source: Ambulatory Visit | Attending: Internal Medicine | Admitting: Internal Medicine

## 2012-03-15 DIAGNOSIS — M810 Age-related osteoporosis without current pathological fracture: Secondary | ICD-10-CM | POA: Insufficient documentation

## 2012-03-15 MED ORDER — SODIUM CHLORIDE 0.9 % IV SOLN
INTRAVENOUS | Status: AC
Start: 2012-03-15 — End: 2012-03-15
  Administered 2012-03-15: 14:00:00 via INTRAVENOUS

## 2012-03-15 MED ORDER — ZOLEDRONIC ACID 5 MG/100ML IV SOLN
5.0000 mg | Freq: Once | INTRAVENOUS | Status: AC
  Administered 2012-03-15: 5 mg via INTRAVENOUS
  Filled 2012-03-15: qty 100

## 2012-06-05 ENCOUNTER — Encounter: Payer: Self-pay | Admitting: Cardiology

## 2012-07-12 IMAGING — CR DG CHEST 2V
2 series · 2 of 2 positions shown · non-contrast
Comparison: 07/08/2011

CLINICAL DATA: Previous CABG.  Follow-up

CHEST - 2 VIEW

[w chest pa]
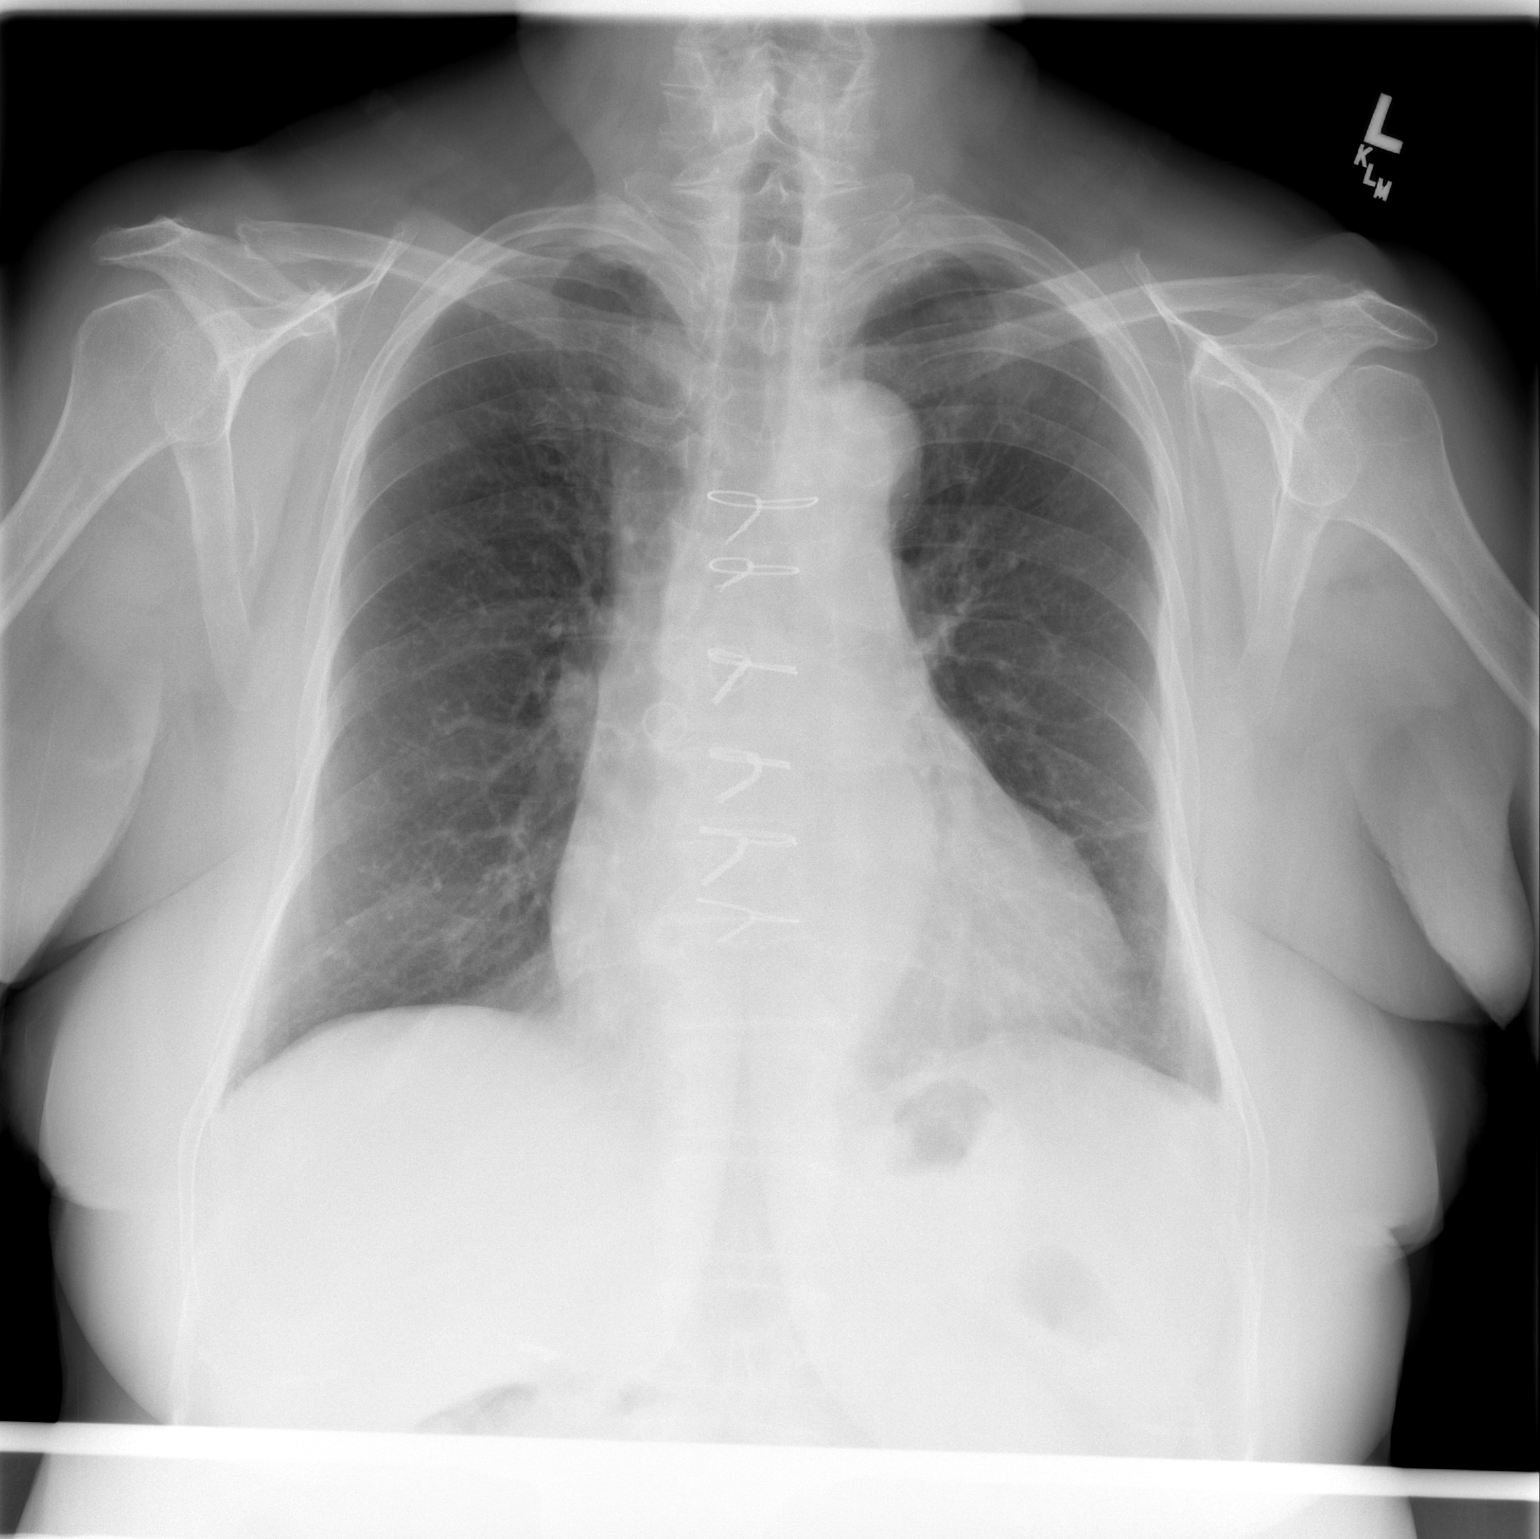

[w chest lat]
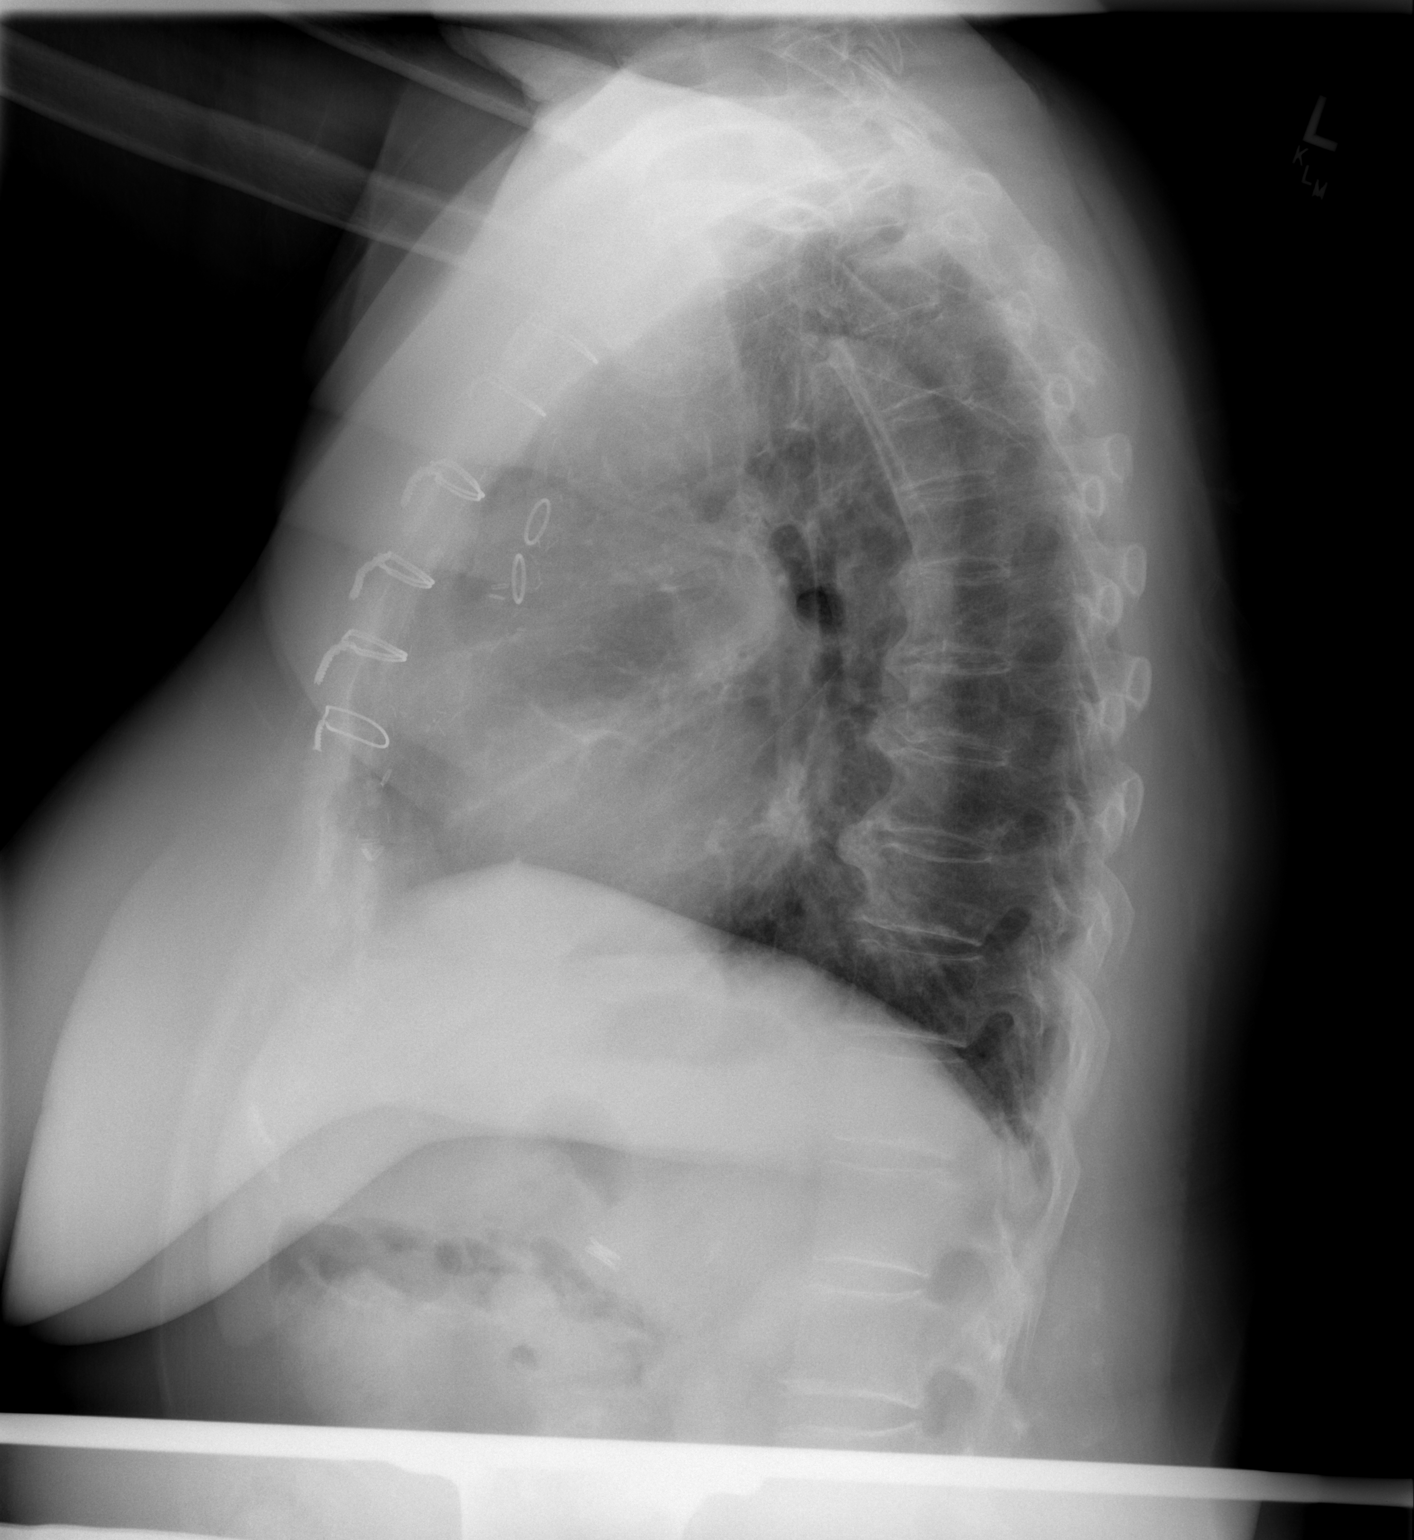

[2 of 2 positions shown; findings below may reference images not displayed]

FINDINGS: There is been previous median sternotomy and CABG.  Heart
size is normal.  The aorta is unfolded.  The lungs are clear.  The
vascularity is normal.  No effusions.  Chronic degenerative change
effects the spine.
IMPRESSION: Previous CABG.  No active disease.

## 2012-10-31 ENCOUNTER — Other Ambulatory Visit: Payer: Self-pay | Admitting: Physical Medicine and Rehabilitation

## 2012-10-31 DIAGNOSIS — M79605 Pain in left leg: Secondary | ICD-10-CM

## 2012-10-31 DIAGNOSIS — R270 Ataxia, unspecified: Secondary | ICD-10-CM

## 2012-10-31 DIAGNOSIS — M79604 Pain in right leg: Secondary | ICD-10-CM

## 2012-10-31 DIAGNOSIS — M545 Low back pain: Secondary | ICD-10-CM

## 2012-10-31 DIAGNOSIS — R479 Unspecified speech disturbances: Secondary | ICD-10-CM

## 2012-11-03 ENCOUNTER — Ambulatory Visit
Admission: RE | Admit: 2012-11-03 | Discharge: 2012-11-03 | Disposition: A | Discharge status: Home / Self Care | Payer: Medicare Other | Source: Ambulatory Visit | Attending: Physical Medicine and Rehabilitation | Admitting: Physical Medicine and Rehabilitation

## 2012-11-03 DIAGNOSIS — R270 Ataxia, unspecified: Secondary | ICD-10-CM

## 2012-11-03 DIAGNOSIS — M545 Low back pain: Secondary | ICD-10-CM

## 2012-11-03 DIAGNOSIS — M79604 Pain in right leg: Secondary | ICD-10-CM

## 2012-11-03 DIAGNOSIS — M79605 Pain in left leg: Secondary | ICD-10-CM

## 2012-11-03 DIAGNOSIS — R479 Unspecified speech disturbances: Secondary | ICD-10-CM

## 2012-11-03 MED ORDER — GADOBENATE DIMEGLUMINE 529 MG/ML IV SOLN
19.0000 mL | Freq: Once | INTRAVENOUS | Status: AC | PRN
  Administered 2012-11-03: 19 mL via INTRAVENOUS

## 2012-11-28 ENCOUNTER — Other Ambulatory Visit: Payer: Self-pay | Admitting: Internal Medicine

## 2012-11-28 DIAGNOSIS — J984 Other disorders of lung: Secondary | ICD-10-CM

## 2013-01-17 ENCOUNTER — Encounter: Payer: Self-pay | Admitting: Interventional Cardiology

## 2013-01-29 ENCOUNTER — Other Ambulatory Visit: Payer: Self-pay | Admitting: Internal Medicine

## 2013-01-29 ENCOUNTER — Other Ambulatory Visit: Payer: Medicare Other

## 2013-01-29 ENCOUNTER — Ambulatory Visit
Admission: RE | Admit: 2013-01-29 | Discharge: 2013-01-29 | Disposition: A | Discharge status: Home / Self Care | Payer: Medicare Other | Source: Ambulatory Visit | Attending: Internal Medicine | Admitting: Internal Medicine

## 2013-01-29 DIAGNOSIS — R911 Solitary pulmonary nodule: Secondary | ICD-10-CM

## 2013-03-13 ENCOUNTER — Ambulatory Visit (HOSPITAL_COMMUNITY)
Admission: RE | Admit: 2013-03-13 | Discharge: 2013-03-13 | Disposition: A | Discharge status: Home / Self Care | Payer: Medicare Other | Source: Ambulatory Visit | Attending: Internal Medicine | Admitting: Internal Medicine

## 2013-03-13 ENCOUNTER — Other Ambulatory Visit (HOSPITAL_COMMUNITY): Payer: Self-pay | Admitting: Internal Medicine

## 2013-03-13 DIAGNOSIS — M810 Age-related osteoporosis without current pathological fracture: Secondary | ICD-10-CM | POA: Insufficient documentation

## 2013-03-13 MED ORDER — ZOLEDRONIC ACID 5 MG/100ML IV SOLN
5.0000 mg | Freq: Once | INTRAVENOUS | Status: AC
  Administered 2013-03-13: 5 mg via INTRAVENOUS
  Filled 2013-03-13: qty 100

## 2013-03-13 MED ORDER — SODIUM CHLORIDE 0.9 % IV SOLN
Freq: Once | INTRAVENOUS | Status: AC
  Administered 2013-03-13: 13:00:00 via INTRAVENOUS

## 2013-03-13 NOTE — Progress Notes (Signed)
Lab work meets criteria for RECLAST infusion. Pt states she does not take calcium supplements but takes dietary calcium and that Dr Waynard Edwards is aware of this and that is what she was doing when lab work was drawn.

## 2013-04-03 ENCOUNTER — Other Ambulatory Visit: Payer: Self-pay | Admitting: *Deleted

## 2013-04-03 DIAGNOSIS — E785 Hyperlipidemia, unspecified: Secondary | ICD-10-CM

## 2013-04-21 ENCOUNTER — Other Ambulatory Visit (INDEPENDENT_AMBULATORY_CARE_PROVIDER_SITE_OTHER): Payer: Medicare Other

## 2013-04-21 DIAGNOSIS — E785 Hyperlipidemia, unspecified: Secondary | ICD-10-CM

## 2013-04-21 LAB — LIPID PANEL
CHOL/HDL RATIO: 3
Cholesterol: 155 mg/dL (ref 0–200)
HDL: 51.5 mg/dL (ref >39.00)
LDL CALC: 88 mg/dL (ref 0–99)
Triglycerides: 79 mg/dL (ref 0.0–149.0)
VLDL: 15.8 mg/dL (ref 0.0–40.0)

## 2013-04-21 LAB — HEPATIC FUNCTION PANEL
ALT: 18 U/L (ref 0–35)
AST: 17 U/L (ref 0–37)
Albumin: 3.5 g/dL (ref 3.5–5.2)
Alkaline Phosphatase: 74 U/L (ref 39–117)
BILIRUBIN DIRECT: 0.1 mg/dL (ref 0.0–0.3)
BILIRUBIN TOTAL: 0.8 mg/dL (ref 0.3–1.2)
Total Protein: 7 g/dL (ref 6.0–8.3)

## 2013-04-23 ENCOUNTER — Telehealth: Payer: Self-pay

## 2013-04-23 DIAGNOSIS — E785 Hyperlipidemia, unspecified: Secondary | ICD-10-CM

## 2013-04-23 NOTE — Telephone Encounter (Signed)
Message copied by Lamar Laundry on Wed Apr 23, 2013  9:26 AM ------      Message from: Daneen Schick      Created: Tue Apr 22, 2013  7:37 AM       At target. Repeat in 1 year ------

## 2013-08-18 ENCOUNTER — Other Ambulatory Visit: Payer: Self-pay | Admitting: Interventional Cardiology

## 2013-09-10 ENCOUNTER — Emergency Department (HOSPITAL_COMMUNITY): Payer: Medicare Other

## 2013-09-10 ENCOUNTER — Encounter (HOSPITAL_COMMUNITY): Payer: Self-pay | Admitting: Emergency Medicine

## 2013-09-10 ENCOUNTER — Emergency Department (HOSPITAL_COMMUNITY)
Admission: EM | Admit: 2013-09-10 | Discharge: 2013-09-10 | Disposition: A | Discharge status: Home / Self Care | Payer: Medicare Other | Attending: Emergency Medicine | Admitting: Emergency Medicine

## 2013-09-10 DIAGNOSIS — Z79899 Other long term (current) drug therapy: Secondary | ICD-10-CM | POA: Insufficient documentation

## 2013-09-10 DIAGNOSIS — R109 Unspecified abdominal pain: Secondary | ICD-10-CM | POA: Insufficient documentation

## 2013-09-10 DIAGNOSIS — F329 Major depressive disorder, single episode, unspecified: Secondary | ICD-10-CM | POA: Insufficient documentation

## 2013-09-10 DIAGNOSIS — E119 Type 2 diabetes mellitus without complications: Secondary | ICD-10-CM | POA: Insufficient documentation

## 2013-09-10 DIAGNOSIS — Z9089 Acquired absence of other organs: Secondary | ICD-10-CM | POA: Insufficient documentation

## 2013-09-10 DIAGNOSIS — Z7982 Long term (current) use of aspirin: Secondary | ICD-10-CM | POA: Insufficient documentation

## 2013-09-10 DIAGNOSIS — K219 Gastro-esophageal reflux disease without esophagitis: Secondary | ICD-10-CM | POA: Insufficient documentation

## 2013-09-10 DIAGNOSIS — Z9889 Other specified postprocedural states: Secondary | ICD-10-CM | POA: Insufficient documentation

## 2013-09-10 DIAGNOSIS — Z8709 Personal history of other diseases of the respiratory system: Secondary | ICD-10-CM | POA: Insufficient documentation

## 2013-09-10 DIAGNOSIS — I10 Essential (primary) hypertension: Secondary | ICD-10-CM | POA: Insufficient documentation

## 2013-09-10 DIAGNOSIS — Z792 Long term (current) use of antibiotics: Secondary | ICD-10-CM | POA: Insufficient documentation

## 2013-09-10 DIAGNOSIS — Z951 Presence of aortocoronary bypass graft: Secondary | ICD-10-CM | POA: Insufficient documentation

## 2013-09-10 DIAGNOSIS — F3289 Other specified depressive episodes: Secondary | ICD-10-CM | POA: Insufficient documentation

## 2013-09-10 DIAGNOSIS — I2510 Atherosclerotic heart disease of native coronary artery without angina pectoris: Secondary | ICD-10-CM | POA: Insufficient documentation

## 2013-09-10 DIAGNOSIS — F411 Generalized anxiety disorder: Secondary | ICD-10-CM | POA: Insufficient documentation

## 2013-09-10 DIAGNOSIS — E039 Hypothyroidism, unspecified: Secondary | ICD-10-CM | POA: Insufficient documentation

## 2013-09-10 LAB — COMPREHENSIVE METABOLIC PANEL
ALT: 12 U/L (ref 0–35)
AST: 17 U/L (ref 0–37)
Albumin: 3.4 g/dL — ABNORMAL LOW (ref 3.5–5.2)
Alkaline Phosphatase: 74 U/L (ref 39–117)
BUN: 20 mg/dL (ref 6–23)
CO2: 30 mEq/L (ref 19–32)
Calcium: 9.2 mg/dL (ref 8.4–10.5)
Chloride: 102 mEq/L (ref 96–112)
Creatinine, Ser: 0.75 mg/dL (ref 0.50–1.10)
GFR calc Af Amer: >90 mL/min (ref >90)
GFR calc non Af Amer: 81 mL/min — ABNORMAL LOW (ref >90)
Glucose, Bld: 96 mg/dL (ref 70–99)
Potassium: 3.4 mEq/L — ABNORMAL LOW (ref 3.7–5.3)
Sodium: 144 mEq/L (ref 137–147)
Total Bilirubin: 0.7 mg/dL (ref 0.3–1.2)
Total Protein: 6.7 g/dL (ref 6.0–8.3)

## 2013-09-10 LAB — URINALYSIS, ROUTINE W REFLEX MICROSCOPIC
Glucose, UA: NEGATIVE mg/dL
Hgb urine dipstick: NEGATIVE
Ketones, ur: 15 mg/dL — AB
Nitrite: NEGATIVE
Protein, ur: NEGATIVE mg/dL
Specific Gravity, Urine: 1.024 (ref 1.005–1.030)
Urobilinogen, UA: 1 mg/dL (ref 0.0–1.0)
pH: 5.5 (ref 5.0–8.0)

## 2013-09-10 LAB — CBC WITH DIFFERENTIAL/PLATELET
Basophils Absolute: 0 10*3/uL (ref 0.0–0.1)
Basophils Relative: 0 % (ref 0–1)
Eosinophils Absolute: 0.2 10*3/uL (ref 0.0–0.7)
Eosinophils Relative: 2 % (ref 0–5)
HCT: 39.5 % (ref 36.0–46.0)
Hemoglobin: 13.2 g/dL (ref 12.0–15.0)
Lymphocytes Relative: 21 % (ref 12–46)
Lymphs Abs: 2.6 10*3/uL (ref 0.7–4.0)
MCH: 30 pg (ref 26.0–34.0)
MCHC: 33.4 g/dL (ref 30.0–36.0)
MCV: 89.8 fL (ref 78.0–100.0)
Monocytes Absolute: 1.3 10*3/uL — ABNORMAL HIGH (ref 0.1–1.0)
Monocytes Relative: 10 % (ref 3–12)
Neutro Abs: 8.3 10*3/uL — ABNORMAL HIGH (ref 1.7–7.7)
Neutrophils Relative %: 67 % (ref 43–77)
Platelets: 240 10*3/uL (ref 150–400)
RBC: 4.4 MIL/uL (ref 3.87–5.11)
RDW: 12.3 % (ref 11.5–15.5)
WBC: 12.4 10*3/uL — ABNORMAL HIGH (ref 4.0–10.5)

## 2013-09-10 LAB — URINE MICROSCOPIC-ADD ON

## 2013-09-10 LAB — I-STAT CG4 LACTIC ACID, ED: Lactic Acid, Venous: 1.4 mmol/L (ref 0.5–2.2)

## 2013-09-10 LAB — LIPASE, BLOOD: Lipase: 30 U/L (ref 11–59)

## 2013-09-10 MED ORDER — CEPHALEXIN 500 MG PO CAPS: 1000.0000 mg | ORAL_CAPSULE | Freq: Two times a day (BID) | ORAL | Status: DC

## 2013-09-10 MED ORDER — SODIUM CHLORIDE 0.9 % IV BOLUS (SEPSIS)
1000.0000 mL | Freq: Once | INTRAVENOUS | Status: AC
  Administered 2013-09-10: 1000 mL via INTRAVENOUS

## 2013-09-10 NOTE — Discharge Instructions (Signed)
Return here as needed.  Testing here they did not show any significant abnormalities other than a urinary infection.  Followup with your primary care Dr.

## 2013-09-10 NOTE — ED Notes (Signed)
Patient reports a history of addisons disease.  She states that when she is stressed she has abdominal cramping which can be severe. States that she had 2 episodes of LLQ abdominal cramping this AM.  States that she takes SL Hyoscyamine, 0.125 mg, which relieves the cramping. She only took 1 this AM.  States that cramping was relieved but she still has some residual soreness

## 2013-09-10 NOTE — ED Notes (Signed)
Patient has a hx of abdominal spasms when under stress.  States that she had spasms X 2 today and took medication which was effective. Patient on Prozac regularly.

## 2013-09-10 NOTE — ED Provider Notes (Signed)
CSN: [id number]     Arrival date & time 09/10/13  1104 History   First MD Initiated Contact with Patient 09/10/13 1118     Chief Complaint  Patient presents with  . Abdominal Pain     (Consider location/radiation/quality/duration/timing/severity/associated sxs/prior Treatment) HPI Patient presents to the emergency department with abdominal spasms that occurred x2 today.  The patient, states she has a long history of diabetes, and is on medications for this.  The patient was at her CPA's office when she had an attack of abdominal spasms.  She states that these are mostly exacerbated by increased stress levels.  The patient, states, that they make her dizzy and feel weak.  Following them.  Patient states that her CPA became concerned and called EMS.  The patient, states, that she's not having any symptoms at this time.  The patient denies chest pain, shortness of breath, headache, blurred vision, weakness, numbness, dizziness, back pain, neck pain, fever, cough, runny nose, sore throat, decreased appetite, dysuria, or syncope.  Patient, states, that she took her medication for her abdominal spasms and it seemed to slowly help. Past Medical History  Diagnosis Date  . 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  last knee surgery dr. Elmyra Ricks gave large dose of hydrocortisal and avoided the hypotensive side effectas of addison's disease.  [name] PONV (postoperative nausea and vomiting)   . Coronary artery disease   . Angina   . Hypertension   . Dysrhythmia   . Shortness of breath   . Recurrent upper respiratory infection (URI)   . Hypothyroidism   . GERD (gastroesophageal reflux disease)   . Arthritis   . Anxiety   . Depression   . Addison's disease    Past Surgical History  Procedure Laterality Date  . Back surgery    . Tonsillectomy    . Diagnostic laparoscopy    . Cardiac catheterization    . Eye surgery    . Dilation and curettage of uterus     . Joint replacement    . Cholecystectomy    . Coronary artery bypass graft  06/26/2011    Procedure: CORONARY ARTERY BYPASS GRAFTING (CABG);  Surgeon: Melrose Nakayama, MD;  Location: [facility];  Service: Open Heart Surgery;  Laterality: N/A;  times using Greater Saphenous Vein Graft harvested endoscopically from right leg   History reviewed. No pertinent family history. History  Substance Use Topics  . Smoking status: Never Smoker   . Smokeless tobacco: Not on file     Comment: pt exposed to second hand smoke for years.  . Alcohol Use: No   OB History   Grav Para Term Preterm Abortions TAB SAB Ect Mult Living                 Review of Systems  All other systems negative except as documented in the HPI. All pertinent positives and negatives as reviewed in the HPI.  Allergies  Percocet and Sulfa antibiotics  Home Medications   Prior to Admission medications   Medication Sig Start Date End Date Taking? Authorizing Provider  amLODipine (NORVASC) 5 MG tablet Take 5 mg by mouth daily.   Yes Historical Provider, MD  aspirin EC 81 MG tablet Take 81 mg by mouth every morning.   Yes Historical Provider, MD  FLUoxetine (PROZAC) 20 MG capsule Take 20 mg by mouth every morning.   Yes Historical Provider, MD  hydrocortisone (CORTEF) 5 MG tablet Take  10 mg by mouth 2 (two) times daily.    Yes Historical Provider, MD  hyoscyamine (LEVSIN, ANASPAZ) 0.125 MG tablet Take 0.125 mg by mouth every 4 (four) hours as needed.   Yes Historical Provider, MD  irbesartan (AVAPRO) 300 MG tablet Take 300 mg by mouth daily.   Yes Historical Provider, MD  levothyroxine (SYNTHROID, LEVOTHROID) 50 MCG tablet Take 50 mcg by mouth daily.   Yes Historical Provider, MD  nebivolol (BYSTOLIC) 5 MG tablet Take 5 mg by mouth at bedtime. Takes 2.5 mg daily   Yes Historical Provider, MD  PRILOSEC OTC 20 MG tablet Take 20 mg by mouth Daily. 07/03/11  Yes Historical Provider, MD  Probiotic Product (PROBIOTIC FORMULA PO) Take  1 capsule by mouth daily as needed. For upset stomach   Yes Historical Provider, MD  rosuvastatin (CRESTOR) 5 MG tablet Take 5 mg by mouth 3 (three) times a week.   Yes Historical Provider, MD  Vitamin D, Ergocalciferol, (DRISDOL) 50000 UNITS CAPS Take 50,000 Units by mouth. Takes on Wednesdays   Yes Historical Provider, MD  cephALEXin (KEFLEX) 500 MG capsule Take 2 capsules (1,000 mg total) by mouth 2 (two) times daily. 09/10/13   Resa Miner Terika Pillard, PA-C  NITROSTAT 0.4 MG SL tablet ONE TABLET UNDER TONGUE AS NEEDED FOR CHEST PAIN AS DIRECTED    Belva Crome III, MD   BP 152/80  Temp(Src) 97.4 F (36.3 C) (Oral)  Resp 17  SpO2 95% Physical Exam  Constitutional: She is oriented to person, place, and time. She appears well-developed and well-nourished. No distress.  HENT:  Head: Normocephalic and atraumatic.  Mouth/Throat: Oropharynx is clear and moist.  Eyes: Pupils are equal, round, and reactive to light.  Neck: Normal range of motion. Neck supple.  Cardiovascular: Normal rate, regular rhythm and normal heart sounds.  Exam reveals no gallop and no friction rub.   No murmur heard. Pulmonary/Chest: Effort normal and breath sounds normal.  Abdominal: Soft. Bowel sounds are normal. She exhibits no distension. There is tenderness. There is no rebound and no guarding.    Neurological: She is alert and oriented to person, place, and time. She exhibits normal muscle tone. Coordination normal.  Skin: Skin is warm and dry. No rash noted. No erythema.    ED Course  Procedures (including critical care time) Labs Review Labs Reviewed  CBC WITH DIFFERENTIAL - Abnormal; Notable for the following:    WBC 12.4 (*)    Neutro Abs 8.3 (*)    Monocytes Absolute 1.3 (*)    All other components within normal limits  COMPREHENSIVE METABOLIC PANEL - Abnormal; Notable for the following:    Potassium 3.4 (*)    Albumin 3.4 (*)    GFR calc non Af Amer 81 (*)    All other components within normal  limits  URINALYSIS, ROUTINE W REFLEX MICROSCOPIC - Abnormal; Notable for the following:    Color, Urine AMBER (*)    APPearance CLOUDY (*)    Bilirubin Urine SMALL (*)    Ketones, ur 15 (*)    Leukocytes, UA MODERATE (*)    All other components within normal limits  URINE MICROSCOPIC-ADD ON - Abnormal; Notable for the following:    Squamous Epithelial / LPF FEW (*)    Bacteria, UA MANY (*)    Crystals CA OXALATE CRYSTALS (*)    All other components within normal limits  URINE CULTURE  LIPASE, BLOOD  I-STAT CG4 LACTIC ACID, ED    Imaging Review Ct  Abdomen Pelvis Wo Contrast  09/10/2013   CLINICAL DATA:  History of Addison's disease.  Abdominal cramping.  EXAM: CT ABDOMEN AND PELVIS WITHOUT CONTRAST  TECHNIQUE: Multidetector CT imaging of the abdomen and pelvis was performed following the standard protocol without IV contrast.  COMPARISON:  09/02/2010  FINDINGS: No pleural effusion identified. Pulmonary nodules in the left lower lobe are stable from previous exam measuring up to 5 mm. Right lower lobe nodules are unchanged measuring up to 6 mm, image 4.  Heart size appears enlarged. Calcifications involving the RCA Coronary artery noted. There is no focal liver abnormality identified. Prior cholecystectomy. No biliary dilatation. Normal appearance of the pancreas. Low-attenuation structure within the spleen measures 8 mm and is unchanged from previous exam. The adrenal glands are both normal. Normal appearance of both kidneys. The urinary bladder appears normal. Uterus and adnexal structures are both unremarkable.  Calcified atherosclerotic disease involves the abdominal aorta. There is no aneurysm. No adenopathy within the abdomen. There is no pelvic or inguinal adenopathy. No abdominal or pelvic ascites. No focal fluid collections identified.  Small hiatal hernia is present. The small bowel loops have a normal course and caliber. No obstruction. The appendix is visualized and appears normal.  Normal appearance of the proximal colon. Multiple distal colonic diverticula are identified. Wall thickening of the sigmoid colon is noted compatible with chronic diverticular disease. No evidence for acute inflammation. No fat stranding or free fluid identified. Indeterminate soft tissue attenuating structure within the distal left pericolic gutter measures 2.3 cm, image 53/ series 201. This was present on previous examination from 09/13/2010 measuring 2 cm.  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 This is most severe at the L4-5 level.  IMPRESSION: 1. No acute findings identified within the abdomen or pelvis. No renal calculi or evidence of obstructive uropathy. 2. 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 3. Soft tissue attenuating structure within the distal left pericolic gutter is minimally increased in size from the previous exam. The minimal change since 09/02/2010 favors a benign indolent process.   Electronically Signed   By: Kerby Moors M.D.   On: 09/10/2013 16:32     EKG Interpretation   Date/Time:  Wednesday September 10 2013 12:16:51 EDT Ventricular Rate:  58 PR Interval:  187 QRS Duration: 120 QT Interval:  464 QTC Calculation: 456 R Axis:   85 Text Interpretation:  Sinus rhythm Nonspecific intraventricular conduction  delay Baseline wander When compared with ECG of 07/08/2011 Rate slower  Confirmed by [facility]  MD, Nunzio Cory [phone]) on 09/10/2013 1:05:55 PM        Patient be treated for a urinary tract infection, but since her symptoms have, resolved.  We'll discharge the patient home.  Patient is advised followup with her primary care Dr. told to return to the emergency department for any worsening in her condition.  Patient is advised of her test results, and all questions were answered.  Patient voices an understanding of the plan I did not feel that the urinary tract infection  as a direct correlation with her symptoms.  I feel that this is related to her chronic abdominal issues  Brent General, PA-C 09/13/13 0008

## 2013-09-11 LAB — URINE CULTURE: Colony Count: 50000

## 2013-09-14 NOTE — ED Provider Notes (Signed)
Medical screening examination/treatment/procedure(s) were performed by non-physician practitioner and as supervising physician I was immediately available for consultation/collaboration.   EKG Interpretation   Date/Time:  Wednesday September 10 2013 12:16:51 EDT Ventricular Rate:  58 PR Interval:  187 QRS Duration: 120 QT Interval:  464 QTC Calculation: 456 R Axis:   85 Text Interpretation:  Sinus rhythm Nonspecific intraventricular conduction  delay Baseline wander When compared with ECG of 07/08/2011 Rate slower  Confirmed by [facility]  MD, Alucard Fearnow ([id number]) on 09/10/2013 1:05:55 PM        Alfonzo Feller, DO 09/14/13 1341

## 2013-11-19 ENCOUNTER — Encounter: Payer: Self-pay | Admitting: Interventional Cardiology

## 2013-12-01 ENCOUNTER — Encounter: Payer: Self-pay | Admitting: Interventional Cardiology

## 2013-12-25 ENCOUNTER — Encounter: Payer: Self-pay | Admitting: Interventional Cardiology

## 2013-12-25 ENCOUNTER — Ambulatory Visit (INDEPENDENT_AMBULATORY_CARE_PROVIDER_SITE_OTHER): Payer: Medicare Other | Admitting: Interventional Cardiology

## 2013-12-25 VITALS — BP 130/78 | HR 79

## 2013-12-25 DIAGNOSIS — I10 Essential (primary) hypertension: Secondary | ICD-10-CM

## 2013-12-25 DIAGNOSIS — E785 Hyperlipidemia, unspecified: Secondary | ICD-10-CM

## 2013-12-25 DIAGNOSIS — Z951 Presence of aortocoronary bypass graft: Secondary | ICD-10-CM

## 2013-12-25 DIAGNOSIS — F411 Generalized anxiety disorder: Secondary | ICD-10-CM

## 2013-12-25 DIAGNOSIS — F419 Anxiety disorder, unspecified: Secondary | ICD-10-CM

## 2013-12-25 NOTE — Patient Instructions (Signed)
Your physician recommends that you continue on your current medications as directed. Please refer to the Current Medication list given to you today.  Call the office if you continue to have jaw and neck pain with activity  Your physician discussed the importance of regular exercise and recommended that you start or continue a regular exercise program for good health.   Your physician wants you to follow-up in: 1 year with Dr.Smith You will receive a reminder letter in the mail two months in advance. If you don't receive a letter, please call our office to schedule the follow-up appointment.

## 2013-12-25 NOTE — Progress Notes (Signed)
Patient ID: [name], female   DOB: 1940/11/25, 73 y.o.   MRN: [id number]    [address], [address], [place]  [address] Phone: [phone] Fax:  [phone]  Date:  12/25/2013   ID:  [name], DOB 12/06/1940, MRN [id number]  PCP:  Jerlyn Ly, MD   ASSESSMENT:  1. Coronary artery disease, with occasional jaw discomfort possibly anginal 2. Depression and anxiety 3. Hypertension 4. Hyperlipidemia  PLAN:  1. Encouraged to be physically active 2. If she continues to have jaw and chest discomfort particularly if it is exertion related, ischemic evaluation may be necessary. 3. Clinical followup in one year 4. I encouraged separation from the stress of her sons live to the extent possible until she feels better.   SUBJECTIVE: [name] is a 73 y.o. female 3 months of severe stress related to her son having severe depression. 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 She was unable to sleep. She feels that the stress is now abating somewhat and her son is doing better. She is not sleeping well. She has not had syncope. Is no peripheral edema. She spent considerable time recounting the stress related to her son, who is edematous, recovering from a psychiatric shutdown. He was institutionalized. This causes her great stress because she fell responsible for his dental practice. She stopped exercising. She was not sleeping well. This during this time she began experiencing jaw and chest discomfort that was fleeting. He would deal is now over and her son is back at work. She still worries about him because he is now and rolled in the impaired dentist program.     Wt Readings from Last 3 Encounters:  03/13/13 197 lb (89.359 kg)  07/26/11 197 lb (89.359 kg)  07/09/11 195 lb 1.7 oz (88.5 kg)     Past Medical History  Diagnosis Date  . 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  last knee surgery dr. Elmyra Ricks gave  large dose of hydrocortisal and avoided the hypotensive side effectas of addison's disease.  [name] PONV (postoperative nausea and vomiting)   . Coronary artery disease   . Angina   . Hypertension   . Dysrhythmia   . Shortness of breath   . Recurrent upper respiratory infection (URI)   . Hypothyroidism   . GERD (gastroesophageal reflux disease)   . Arthritis   . Anxiety   . Depression   . Addison's disease     Current Outpatient Prescriptions  Medication Sig Dispense Refill  . amLODipine (NORVASC) 5 MG tablet Take 5 mg by mouth daily.      [name] aspirin EC 81 MG tablet Take 81 mg by mouth every morning.      [name] FLUoxetine (PROZAC) 20 MG capsule Take 20 mg by mouth every morning.      . hydrocortisone (CORTEF) 5 MG tablet Take 10 mg by mouth 2 (two) times daily.       . irbesartan (AVAPRO) 300 MG tablet Take 300 mg by mouth daily.      [name] levothyroxine (SYNTHROID, LEVOTHROID) 50 MCG tablet Take 50 mcg by mouth daily.      . nebivolol (BYSTOLIC) 5 MG tablet Take 5 mg by mouth at bedtime. Takes 2.5 mg daily      . NITROSTAT 0.4 MG SL tablet ONE TABLET UNDER TONGUE AS NEEDED FOR CHEST PAIN AS DIRECTED  25 tablet  2  . omeprazole (PRILOSEC)  20 MG capsule       . Probiotic Product (PROBIOTIC FORMULA PO) Take 1 capsule by mouth daily as needed. For upset stomach      . Vitamin D, Ergocalciferol, (DRISDOL) 50000 UNITS CAPS Take 50,000 Units by mouth. Takes on Wednesdays       No current facility-administered medications for this visit.    Allergies:    Allergies  Allergen Reactions  . Percocet [Oxycodone-Acetaminophen]     Hallucination  . Sulfa Antibiotics Itching    Vaginal itching    Social History:  The patient  reports that she has never smoked. She does not have any smokeless tobacco history on file. She reports that she does not drink alcohol or use illicit drugs.   ROS:  Please see the history of present illness.   Anxious and tearful   All other systems reviewed and negative.    OBJECTIVE: VS:  BP 130/78  Pulse 79 Well nourished, well developed, in no acute distress, obese HEENT: normal Neck: JVD flat. Carotid bruit absent  Cardiac:  normal S1, S2; RRR; no murmur Lungs:  clear to auscultation bilaterally, no wheezing, rhonchi or rales Abd: soft, nontender, no hepatomegaly Ext: Edema absent. Pulses 2+ Skin: warm and dry Neuro:  CNs 2-12 intact, no focal abnormalities noted  EKG:  Normal sinus rhythm with premature atrial contractions.       Signed, Illene Labrador III, MD 12/25/2013 3:53 PM

## 2014-02-17 ENCOUNTER — Encounter: Payer: Self-pay | Admitting: Interventional Cardiology

## 2014-03-18 ENCOUNTER — Other Ambulatory Visit (HOSPITAL_COMMUNITY): Payer: Self-pay | Admitting: Internal Medicine

## 2014-03-18 ENCOUNTER — Ambulatory Visit (HOSPITAL_COMMUNITY): Admission: RE | Admit: 2014-03-18 | Payer: Medicare Other | Source: Ambulatory Visit

## 2014-03-20 ENCOUNTER — Encounter: Payer: Self-pay | Admitting: Interventional Cardiology

## 2014-04-27 ENCOUNTER — Ambulatory Visit (INDEPENDENT_AMBULATORY_CARE_PROVIDER_SITE_OTHER): Payer: 59 | Admitting: Neurology

## 2014-04-27 ENCOUNTER — Encounter: Payer: Self-pay | Admitting: Neurology

## 2014-04-27 DIAGNOSIS — M5416 Radiculopathy, lumbar region: Secondary | ICD-10-CM

## 2014-04-27 HISTORY — DX: Radiculopathy, lumbar region: M54.16

## 2014-04-27 NOTE — Progress Notes (Signed)
Please refer to EMG and nerve conduction study procedure note. 

## 2014-04-27 NOTE — Procedures (Signed)
     HISTORY:  [name] is a 74 year old patient with a history of lumbosacral spine surgery with fusion at the L4-5 level in the late 1980s with an anterior approach. She has had recent exacerbation in the back pain without radiation down the legs. She is being evaluated for possible nerve root impingement.  NERVE CONDUCTION STUDIES:  Nerve conduction studies were performed on both lower extremities. The distal motor latencies and motor amplitudes for the peroneal and posterior tibial nerves were within normal limits. The nerve conduction velocities for these nerves were also normal. The H reflex latencies were normal. The sensory latencies for the peroneal nerves were within normal limits.   EMG STUDIES:  EMG study was performed on the right lower extremity:  The tibialis anterior muscle reveals 2 to 5K motor units with decreased recruitment. No fibrillations or positive waves were seen. The peroneus tertius muscle reveals 2 to 5K motor units with decreased recruitment. No fibrillations or positive waves were seen. The medial gastrocnemius muscle reveals 1 to 3K motor units with full recruitment. No fibrillations or positive waves were seen. The vastus lateralis muscle reveals 2 to 4K motor units with full recruitment. No fibrillations or positive waves were seen. The iliopsoas muscle reveals 2 to 4K motor units with full recruitment. No fibrillations or positive waves were seen. The biceps femoris muscle (long head) reveals 2 to 4K motor units with full recruitment. No fibrillations or positive waves were seen. The lumbosacral paraspinal muscles were tested at 3 levels, and revealed no abnormalities of insertional activity at the upper and middle levels tested. 2+ positive waves were seen in the lower level. There was good relaxation.  EMG study was performed on the left lower extremity:  The tibialis anterior muscle reveals 2 to 4K motor units with full recruitment. No  fibrillations or positive waves were seen. The peroneus tertius muscle reveals 2 to 5K motor units with decreased recruitment. No fibrillations or positive waves were seen. The medial gastrocnemius muscle reveals 1 to 3K motor units with full recruitment. No fibrillations or positive waves were seen. The vastus lateralis muscle reveals 2 to 4K motor units with full recruitment. No fibrillations or positive waves were seen. The iliopsoas muscle reveals 2 to 4K motor units with full recruitment. No fibrillations or positive waves were seen. The biceps femoris muscle (long head) reveals 2 to 4K motor units with full recruitment. No fibrillations or positive waves were seen. The lumbosacral paraspinal muscles were tested at 3 levels, and revealed no abnormalities of insertional activity at the middle and upper levels tested. 1+ fibrillations and positive waves were seen at the lower level.  There was good relaxation.   IMPRESSION:  Nerve conduction studies done on both lower extremities were within normal limits, without evidence of a peripheral neuropathy. EMG evaluation of the right lower extremity shows chronic stable changes in the leg consistent with a L5 radiculopathy. Similar findings are seen on the EMG of the left lower extremity. There appears be some acute denervation in the lower lumbosacral paraspinal muscles bilaterally, right greater than left. This finding would suggest a lower lumbosacral radiculopathy that is more acute, but of indeterminate level.  Jill Alexanders MD 04/27/2014 2:37 PM  [facility] [address] [place], [place] [address]  Phone [phone] Fax [phone]

## 2014-06-10 ENCOUNTER — Emergency Department (HOSPITAL_COMMUNITY)
Admission: EM | Admit: 2014-06-10 | Discharge: 2014-06-10 | Disposition: A | Discharge status: Home / Self Care | Payer: Medicare Other | Attending: Emergency Medicine | Admitting: Emergency Medicine

## 2014-06-10 ENCOUNTER — Encounter (HOSPITAL_COMMUNITY): Payer: Self-pay | Admitting: *Deleted

## 2014-06-10 DIAGNOSIS — Z951 Presence of aortocoronary bypass graft: Secondary | ICD-10-CM | POA: Insufficient documentation

## 2014-06-10 DIAGNOSIS — E039 Hypothyroidism, unspecified: Secondary | ICD-10-CM | POA: Diagnosis not present

## 2014-06-10 DIAGNOSIS — Z7952 Long term (current) use of systemic steroids: Secondary | ICD-10-CM | POA: Diagnosis not present

## 2014-06-10 DIAGNOSIS — N3000 Acute cystitis without hematuria: Secondary | ICD-10-CM | POA: Insufficient documentation

## 2014-06-10 DIAGNOSIS — M1990 Unspecified osteoarthritis, unspecified site: Secondary | ICD-10-CM | POA: Insufficient documentation

## 2014-06-10 DIAGNOSIS — Z79899 Other long term (current) drug therapy: Secondary | ICD-10-CM | POA: Diagnosis not present

## 2014-06-10 DIAGNOSIS — Z7982 Long term (current) use of aspirin: Secondary | ICD-10-CM | POA: Diagnosis not present

## 2014-06-10 DIAGNOSIS — Z8709 Personal history of other diseases of the respiratory system: Secondary | ICD-10-CM | POA: Insufficient documentation

## 2014-06-10 DIAGNOSIS — I25119 Atherosclerotic heart disease of native coronary artery with unspecified angina pectoris: Secondary | ICD-10-CM | POA: Insufficient documentation

## 2014-06-10 DIAGNOSIS — F329 Major depressive disorder, single episode, unspecified: Secondary | ICD-10-CM | POA: Insufficient documentation

## 2014-06-10 DIAGNOSIS — I10 Essential (primary) hypertension: Secondary | ICD-10-CM | POA: Diagnosis not present

## 2014-06-10 DIAGNOSIS — K219 Gastro-esophageal reflux disease without esophagitis: Secondary | ICD-10-CM | POA: Diagnosis not present

## 2014-06-10 DIAGNOSIS — F419 Anxiety disorder, unspecified: Secondary | ICD-10-CM | POA: Insufficient documentation

## 2014-06-10 DIAGNOSIS — R339 Retention of urine, unspecified: Secondary | ICD-10-CM | POA: Diagnosis present

## 2014-06-10 LAB — BASIC METABOLIC PANEL
ANION GAP: 9 (ref 5–15)
BUN: 20 mg/dL (ref 6–23)
CO2: 31 mmol/L (ref 19–32)
CREATININE: 0.96 mg/dL (ref 0.50–1.10)
Calcium: 9 mg/dL (ref 8.4–10.5)
Chloride: 97 mmol/L (ref 96–112)
GFR calc Af Amer: 65 mL/min — ABNORMAL LOW (ref >90)
GFR calc non Af Amer: 56 mL/min — ABNORMAL LOW (ref >90)
Glucose, Bld: 89 mg/dL (ref 70–99)
Potassium: 3.1 mmol/L — ABNORMAL LOW (ref 3.5–5.1)
Sodium: 137 mmol/L (ref 135–145)

## 2014-06-10 LAB — URINALYSIS, ROUTINE W REFLEX MICROSCOPIC
Glucose, UA: NEGATIVE mg/dL
KETONES UR: NEGATIVE mg/dL
Nitrite: NEGATIVE
Protein, ur: 30 mg/dL — AB
Specific Gravity, Urine: 1.024 (ref 1.005–1.030)
UROBILINOGEN UA: 1 mg/dL (ref 0.0–1.0)
pH: 5 (ref 5.0–8.0)

## 2014-06-10 LAB — CBC WITH DIFFERENTIAL/PLATELET
Basophils Absolute: 0 10*3/uL (ref 0.0–0.1)
Basophils Relative: 0 % (ref 0–1)
EOS ABS: 0.1 10*3/uL (ref 0.0–0.7)
EOS PCT: 1 % (ref 0–5)
HCT: 42.9 % (ref 36.0–46.0)
HEMOGLOBIN: 14.3 g/dL (ref 12.0–15.0)
Lymphocytes Relative: 22 % (ref 12–46)
Lymphs Abs: 2.8 10*3/uL (ref 0.7–4.0)
MCH: 30.9 pg (ref 26.0–34.0)
MCHC: 33.3 g/dL (ref 30.0–36.0)
MCV: 92.7 fL (ref 78.0–100.0)
MONOS PCT: 11 % (ref 3–12)
Monocytes Absolute: 1.4 10*3/uL — ABNORMAL HIGH (ref 0.1–1.0)
NEUTROS PCT: 66 % (ref 43–77)
Neutro Abs: 8.3 10*3/uL — ABNORMAL HIGH (ref 1.7–7.7)
PLATELETS: 297 10*3/uL (ref 150–400)
RBC: 4.63 MIL/uL (ref 3.87–5.11)
RDW: 13.1 % (ref 11.5–15.5)
WBC: 12.5 10*3/uL — ABNORMAL HIGH (ref 4.0–10.5)

## 2014-06-10 LAB — URINE MICROSCOPIC-ADD ON

## 2014-06-10 MED ORDER — CEFTRIAXONE SODIUM 1 G IJ SOLR
1.0000 g | Freq: Once | INTRAMUSCULAR | Status: AC
  Administered 2014-06-10: 1 g via INTRAVENOUS
  Filled 2014-06-10: qty 10

## 2014-06-10 MED ORDER — PHENAZOPYRIDINE HCL 100 MG PO TABS
100.0000 mg | ORAL_TABLET | Freq: Once | ORAL | Status: AC
  Administered 2014-06-10: 100 mg via ORAL
  Filled 2014-06-10: qty 1

## 2014-06-10 MED ORDER — HYDROCORTISONE NA SUCCINATE PF 100 MG IJ SOLR
100.0000 mg | Freq: Once | INTRAMUSCULAR | Status: AC
  Administered 2014-06-10: 100 mg via INTRAVENOUS
  Filled 2014-06-10: qty 2

## 2014-06-10 MED ORDER — SODIUM CHLORIDE 0.9 % IV BOLUS (SEPSIS)
500.0000 mL | Freq: Once | INTRAVENOUS | Status: AC
  Administered 2014-06-10: 500 mL via INTRAVENOUS

## 2014-06-10 MED ORDER — POTASSIUM CHLORIDE CRYS ER 20 MEQ PO TBCR
20.0000 meq | EXTENDED_RELEASE_TABLET | Freq: Once | ORAL | Status: AC
  Administered 2014-06-10: 20 meq via ORAL
  Filled 2014-06-10: qty 1

## 2014-06-10 MED ORDER — PHENAZOPYRIDINE HCL 200 MG PO TABS
200.0000 mg | ORAL_TABLET | Freq: Three times a day (TID) | ORAL | Status: DC
Start: 2014-06-10 — End: 2014-09-01

## 2014-06-10 NOTE — Discharge Instructions (Signed)

## 2014-06-10 NOTE — ED Notes (Signed)
Bed: BP10 Expected date: 06/10/14 Expected time:  Means of arrival:  Comments:

## 2014-06-10 NOTE — ED Notes (Signed)
Bed: [facility] Expected date:  Expected time:  Means of arrival:  Comments: UTI

## 2014-06-10 NOTE — ED Notes (Signed)
Seen by PCP today for urinary tract infection and urinary retention. MD wanted to send for admission. She refused. Had IM abx and went to get nails done. While there had increased abdominal pain and became diaphoretic.

## 2014-06-10 NOTE — ED Provider Notes (Signed)
CSN: [id number]     Arrival date & time 06/10/14  1312 History   First MD Initiated Contact with Patient 06/10/14 1440     Chief Complaint  Patient presents with  . Urinary Retention     The history is provided by the patient. No language interpreter was used.   [name] presents for evaluation of dysuria and urinary frequency. Her symptoms started 4-5 days ago. She reports burning and pain in the pelvic region after voiding. She denies any fevers, abdominal pain, vomiting, diarrhea. Her symptoms worsen considerably today and then she saw her primary care provider, they gave her a shot of antibiotics and a prescription for antibiotics. She presents to the emergency department for evaluation now because she has increased pelvic pain and felt like she might pass out. She denies any vaginal discharge or rashes.symptoms are moderate, intermittent, worsening.  Past Medical History  Diagnosis Date  . 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  last knee surgery dr. Elmyra Ricks gave large dose of hydrocortisal and avoided the hypotensive side effectas of addison's disease.  [name] PONV (postoperative nausea and vomiting)   . Coronary artery disease   . Angina   . Hypertension   . Dysrhythmia   . Shortness of breath   . Recurrent upper respiratory infection (URI)   . Hypothyroidism   . GERD (gastroesophageal reflux disease)   . Arthritis   . Anxiety   . Depression   . Addison's disease   . Lumbar radiculopathy 04/27/2014   Past Surgical History  Procedure Laterality Date  . Back surgery    . Tonsillectomy    . Diagnostic laparoscopy    . Cardiac catheterization    . Eye surgery    . Dilation and curettage of uterus    . Joint replacement    . Cholecystectomy    . Coronary artery bypass graft  06/26/2011    Procedure: CORONARY ARTERY BYPASS GRAFTING (CABG);  Surgeon: Melrose Nakayama, MD;  Location: [place];  Service: Open Heart Surgery;   Laterality: N/A;  times using Greater Saphenous Vein Graft harvested endoscopically from right leg   Family History  Problem Relation Age of Onset  . Heart attack Father    History  Substance Use Topics  . Smoking status: Never Smoker   . Smokeless tobacco: Not on file     Comment: pt exposed to second hand smoke for years.  . Alcohol Use: No   OB History    No data available     Review of Systems  All other systems reviewed and are negative.     Allergies  Percocet and Sulfa antibiotics  Home Medications   Prior to Admission medications   Medication Sig Start Date End Date Taking? Authorizing Provider  amLODipine (NORVASC) 5 MG tablet Take 5 mg by mouth daily.   Yes Historical Provider, MD  aspirin EC 81 MG tablet Take 81 mg by mouth every morning.   Yes Historical Provider, MD  FLUoxetine (PROZAC) 20 MG capsule Take 20 mg by mouth every morning.   Yes Historical Provider, MD  hydrocortisone (CORTEF) 20 MG tablet Take 20 mg by mouth 2 (two) times daily.   Yes Historical Provider, MD  irbesartan (AVAPRO) 300 MG tablet Take 300 mg by mouth daily.   Yes Historical Provider, MD  levothyroxine (SYNTHROID, LEVOTHROID) 50 MCG tablet Take 50 mcg by mouth daily.   Yes Historical Provider, MD  nebivolol (BYSTOLIC) 5  MG tablet Take 2.5 mg by mouth at bedtime. Takes 2.5 mg daily   Yes Historical Provider, MD  NITROSTAT 0.4 MG SL tablet ONE TABLET UNDER TONGUE AS NEEDED FOR CHEST PAIN AS DIRECTED Patient taking differently: 1 tablet under the tongue as needed for chest pain as directed.   Yes Belva Crome, MD  omeprazole (PRILOSEC) 20 MG capsule  11/19/13  Yes Historical Provider, MD  Probiotic Product (PROBIOTIC FORMULA PO) Take 1 capsule by mouth daily as needed. For upset stomach   Yes Historical Provider, MD  Vitamin D, Ergocalciferol, (DRISDOL) 50000 UNITS CAPS Take 50,000 Units by mouth 2 (two) times a week. Takes on Wednesdays   Yes Historical Provider, MD  hydrocortisone  (CORTEF) 5 MG tablet Take 10 mg by mouth 2 (two) times daily.     Historical Provider, MD   BP 127/65 mmHg  Pulse 77  Temp(Src) 97.8 F (36.6 C) (Oral)  Resp 16  SpO2 92% Physical Exam  Constitutional: She is oriented to person, place, and time. She appears well-developed and well-nourished.  HENT:  Head: Normocephalic and atraumatic.  Cardiovascular: Normal rate and regular rhythm.   No murmur heard. Pulmonary/Chest: Effort normal and breath sounds normal. No respiratory distress.  Abdominal: Soft. There is no tenderness. There is no rebound and no guarding.  Genitourinary:  Normal external exam.  Musculoskeletal: She exhibits no edema or tenderness.  Neurological: She is alert and oriented to person, place, and time.  Skin: Skin is warm and dry.  Psychiatric: She has a normal mood and affect. Her behavior is normal.  Nursing note and vitals reviewed.   ED Course  Procedures (including critical care time) Labs Review Labs Reviewed  URINALYSIS, ROUTINE W REFLEX MICROSCOPIC - Abnormal; Notable for the following:    Color, Urine ORANGE (*)    APPearance TURBID (*)    Hgb urine dipstick SMALL (*)    Bilirubin Urine SMALL (*)    Protein, ur 30 (*)    Leukocytes, UA MODERATE (*)    All other components within normal limits  BASIC METABOLIC PANEL - Abnormal; Notable for the following:    Potassium 3.1 (*)    GFR calc non Af Amer 56 (*)    GFR calc Af Amer 65 (*)    All other components within normal limits  CBC WITH DIFFERENTIAL/PLATELET - Abnormal; Notable for the following:    WBC 12.5 (*)    Neutro Abs 8.3 (*)    Monocytes Absolute 1.4 (*)    All other components within normal limits  URINE MICROSCOPIC-ADD ON - Abnormal; Notable for the following:    Squamous Epithelial / LPF MANY (*)    Bacteria, UA MANY (*)    Casts HYALINE CASTS (*)    All other components within normal limits  URINE CULTURE    Imaging Review No results found.   EKG Interpretation None       MDM   Final diagnoses:  None    Patient here for evaluation of dysuria, recently started on antibiotics as an outpatient for urinary tract infection. Bladder scan in the emergency department demonstrated no significant urinary retention.she has a history of Addison's disease and did not take her steroids today, giving a dose of Solu-Cortef. Giving 1 dose of Rocephin in the emergency department as well as some IV fluids. Plan to DC home on her antibiotics that were prescribed by primary care physician, this was Augmentin. Writing for Pyridium for pain. Discussed return precautions and oral fluid  hydration.    Quintella Reichert, MD 06/11/14 [phone]

## 2014-06-12 LAB — URINE CULTURE
Colony Count: NO GROWTH
Culture: NO GROWTH

## 2014-06-16 ENCOUNTER — Other Ambulatory Visit: Payer: Self-pay | Admitting: Internal Medicine

## 2014-06-16 ENCOUNTER — Ambulatory Visit
Admission: RE | Admit: 2014-06-16 | Discharge: 2014-06-16 | Disposition: A | Discharge status: Home / Self Care | Payer: Medicare Other | Source: Ambulatory Visit | Attending: Internal Medicine | Admitting: Internal Medicine

## 2014-06-16 DIAGNOSIS — R1084 Generalized abdominal pain: Secondary | ICD-10-CM

## 2014-06-16 MED ORDER — IOPAMIDOL (ISOVUE-300) INJECTION 61%
125.0000 mL | Freq: Once | INTRAVENOUS | Status: AC | PRN
  Administered 2014-06-16: 125 mL via INTRAVENOUS

## 2014-06-16 MED ORDER — IOHEXOL 300 MG/ML  SOLN
30.0000 mL | Freq: Once | INTRAMUSCULAR | Status: AC | PRN
  Administered 2014-06-16: 30 mL via ORAL

## 2014-07-16 HISTORY — PX: ACHILLES TENDON SURGERY: SHX542

## 2014-09-01 ENCOUNTER — Telehealth: Payer: Self-pay | Admitting: Interventional Cardiology

## 2014-09-01 ENCOUNTER — Emergency Department (HOSPITAL_COMMUNITY): Payer: Medicare Other

## 2014-09-01 ENCOUNTER — Observation Stay (HOSPITAL_COMMUNITY)
Admission: EM | Admit: 2014-09-01 | Discharge: 2014-09-03 | Disposition: A | Discharge status: Home / Self Care | Payer: Medicare Other | Attending: Interventional Cardiology | Admitting: Interventional Cardiology

## 2014-09-01 ENCOUNTER — Encounter (HOSPITAL_COMMUNITY): Payer: Self-pay | Admitting: Physical Medicine and Rehabilitation

## 2014-09-01 DIAGNOSIS — F419 Anxiety disorder, unspecified: Secondary | ICD-10-CM | POA: Insufficient documentation

## 2014-09-01 DIAGNOSIS — E876 Hypokalemia: Secondary | ICD-10-CM | POA: Diagnosis present

## 2014-09-01 DIAGNOSIS — I25110 Atherosclerotic heart disease of native coronary artery with unstable angina pectoris: Principal | ICD-10-CM | POA: Insufficient documentation

## 2014-09-01 DIAGNOSIS — K219 Gastro-esophageal reflux disease without esophagitis: Secondary | ICD-10-CM | POA: Diagnosis not present

## 2014-09-01 DIAGNOSIS — F329 Major depressive disorder, single episode, unspecified: Secondary | ICD-10-CM | POA: Insufficient documentation

## 2014-09-01 DIAGNOSIS — R0609 Other forms of dyspnea: Secondary | ICD-10-CM | POA: Diagnosis not present

## 2014-09-01 DIAGNOSIS — Z79899 Other long term (current) drug therapy: Secondary | ICD-10-CM | POA: Diagnosis not present

## 2014-09-01 DIAGNOSIS — E785 Hyperlipidemia, unspecified: Secondary | ICD-10-CM | POA: Diagnosis present

## 2014-09-01 DIAGNOSIS — Z888 Allergy status to other drugs, medicaments and biological substances status: Secondary | ICD-10-CM | POA: Insufficient documentation

## 2014-09-01 DIAGNOSIS — K921 Melena: Secondary | ICD-10-CM | POA: Diagnosis not present

## 2014-09-01 DIAGNOSIS — I200 Unstable angina: Secondary | ICD-10-CM

## 2014-09-01 DIAGNOSIS — Z9049 Acquired absence of other specified parts of digestive tract: Secondary | ICD-10-CM | POA: Insufficient documentation

## 2014-09-01 DIAGNOSIS — I25118 Atherosclerotic heart disease of native coronary artery with other forms of angina pectoris: Secondary | ICD-10-CM | POA: Diagnosis not present

## 2014-09-01 DIAGNOSIS — Z7982 Long term (current) use of aspirin: Secondary | ICD-10-CM | POA: Diagnosis not present

## 2014-09-01 DIAGNOSIS — Z882 Allergy status to sulfonamides status: Secondary | ICD-10-CM | POA: Diagnosis not present

## 2014-09-01 DIAGNOSIS — I10 Essential (primary) hypertension: Secondary | ICD-10-CM | POA: Diagnosis not present

## 2014-09-01 DIAGNOSIS — E039 Hypothyroidism, unspecified: Secondary | ICD-10-CM | POA: Diagnosis not present

## 2014-09-01 DIAGNOSIS — Z951 Presence of aortocoronary bypass graft: Secondary | ICD-10-CM | POA: Diagnosis not present

## 2014-09-01 DIAGNOSIS — R0600 Dyspnea, unspecified: Secondary | ICD-10-CM

## 2014-09-01 HISTORY — DX: Hyperlipidemia, unspecified: E78.5

## 2014-09-01 LAB — COMPREHENSIVE METABOLIC PANEL
ALT: 18 U/L (ref 14–54)
AST: 21 U/L (ref 15–41)
Albumin: 3.3 g/dL — ABNORMAL LOW (ref 3.5–5.0)
Alkaline Phosphatase: 69 U/L (ref 38–126)
Anion gap: 11 (ref 5–15)
BUN: 15 mg/dL (ref 6–20)
CALCIUM: 9.2 mg/dL (ref 8.9–10.3)
CO2: 25 mmol/L (ref 22–32)
CREATININE: 0.71 mg/dL (ref 0.44–1.00)
Chloride: 105 mmol/L (ref 101–111)
GLUCOSE: 129 mg/dL — AB (ref 65–99)
POTASSIUM: 3.2 mmol/L — AB (ref 3.5–5.1)
SODIUM: 141 mmol/L (ref 135–145)
Total Bilirubin: 1.1 mg/dL (ref 0.3–1.2)
Total Protein: 6.4 g/dL — ABNORMAL LOW (ref 6.5–8.1)

## 2014-09-01 LAB — CBC WITH DIFFERENTIAL/PLATELET
BASOS PCT: 0 % (ref 0–1)
Basophils Absolute: 0 10*3/uL (ref 0.0–0.1)
EOS ABS: 0.2 10*3/uL (ref 0.0–0.7)
Eosinophils Relative: 2 % (ref 0–5)
HEMATOCRIT: 37.8 % (ref 36.0–46.0)
Hemoglobin: 12.9 g/dL (ref 12.0–15.0)
Lymphocytes Relative: 31 % (ref 12–46)
Lymphs Abs: 2.4 10*3/uL (ref 0.7–4.0)
MCH: 30.9 pg (ref 26.0–34.0)
MCHC: 34.1 g/dL (ref 30.0–36.0)
MCV: 90.6 fL (ref 78.0–100.0)
MONO ABS: 0.6 10*3/uL (ref 0.1–1.0)
MONOS PCT: 8 % (ref 3–12)
Neutro Abs: 4.7 10*3/uL (ref 1.7–7.7)
Neutrophils Relative %: 59 % (ref 43–77)
PLATELETS: 270 10*3/uL (ref 150–400)
RBC: 4.17 MIL/uL (ref 3.87–5.11)
RDW: 12.5 % (ref 11.5–15.5)
WBC: 8 10*3/uL (ref 4.0–10.5)

## 2014-09-01 LAB — I-STAT TROPONIN, ED: TROPONIN I, POC: 0 ng/mL (ref 0.00–0.08)

## 2014-09-01 LAB — BRAIN NATRIURETIC PEPTIDE: B Natriuretic Peptide: 302.6 pg/mL — ABNORMAL HIGH (ref 0.0–100.0)

## 2014-09-01 MED ORDER — ONDANSETRON HCL 4 MG/2ML IJ SOLN: 4.0000 mg | Freq: Four times a day (QID) | INTRAMUSCULAR | Status: DC | PRN

## 2014-09-01 MED ORDER — NITROGLYCERIN 0.4 MG SL SUBL: 0.4000 mg | SUBLINGUAL_TABLET | SUBLINGUAL | Status: DC | PRN

## 2014-09-01 MED ORDER — ACETAMINOPHEN 500 MG PO TABS
500.0000 mg | ORAL_TABLET | Freq: Every day | ORAL | Status: DC | PRN
Start: 2014-09-01 — End: 2014-09-03
  Administered 2014-09-01 – 2014-09-02 (×2): 500 mg via ORAL
  Filled 2014-09-01 (×2): qty 1

## 2014-09-01 MED ORDER — AMLODIPINE BESYLATE 5 MG PO TABS
5.0000 mg | ORAL_TABLET | Freq: Once | ORAL | Status: AC
  Administered 2014-09-01: 5 mg via ORAL
  Filled 2014-09-01: qty 1

## 2014-09-01 MED ORDER — NEBIVOLOL HCL 2.5 MG PO TABS
2.5000 mg | ORAL_TABLET | Freq: Every day | ORAL | Status: DC
  Administered 2014-09-02: 2.5 mg via ORAL
  Filled 2014-09-01 (×3): qty 1

## 2014-09-01 MED ORDER — ASPIRIN EC 81 MG PO TBEC: 81.0000 mg | DELAYED_RELEASE_TABLET | Freq: Every morning | ORAL | Status: DC

## 2014-09-01 MED ORDER — ASPIRIN EC 81 MG PO TBEC
81.0000 mg | DELAYED_RELEASE_TABLET | Freq: Every day | ORAL | Status: DC
  Administered 2014-09-02 – 2014-09-03 (×2): 81 mg via ORAL
  Filled 2014-09-01 (×2): qty 1

## 2014-09-01 MED ORDER — POTASSIUM CHLORIDE CRYS ER 20 MEQ PO TBCR
40.0000 meq | EXTENDED_RELEASE_TABLET | Freq: Once | ORAL | Status: AC
  Administered 2014-09-01: 40 meq via ORAL
  Filled 2014-09-01: qty 2

## 2014-09-01 MED ORDER — PANTOPRAZOLE SODIUM 40 MG PO TBEC
40.0000 mg | DELAYED_RELEASE_TABLET | Freq: Every day | ORAL | Status: DC
  Administered 2014-09-01 – 2014-09-03 (×3): 40 mg via ORAL
  Filled 2014-09-01 (×3): qty 1

## 2014-09-01 MED ORDER — LEVOTHYROXINE SODIUM 75 MCG PO TABS
75.0000 ug | ORAL_TABLET | Freq: Every day | ORAL | Status: DC
  Administered 2014-09-02 – 2014-09-03 (×2): 75 ug via ORAL
  Filled 2014-09-01 (×3): qty 1

## 2014-09-01 MED ORDER — IRBESARTAN 300 MG PO TABS
300.0000 mg | ORAL_TABLET | Freq: Once | ORAL | Status: AC
  Administered 2014-09-01: 300 mg via ORAL
  Filled 2014-09-01: qty 1

## 2014-09-01 MED ORDER — HEPARIN SODIUM (PORCINE) 5000 UNIT/ML IJ SOLN
5000.0000 [IU] | Freq: Three times a day (TID) | INTRAMUSCULAR | Status: DC
  Administered 2014-09-01 – 2014-09-03 (×5): 5000 [IU] via SUBCUTANEOUS
  Filled 2014-09-01 (×8): qty 1

## 2014-09-01 MED ORDER — LEVOTHYROXINE SODIUM 75 MCG PO TABS
75.0000 ug | ORAL_TABLET | Freq: Once | ORAL | Status: DC
  Filled 2014-09-01: qty 1

## 2014-09-01 MED ORDER — AMLODIPINE BESYLATE 5 MG PO TABS
5.0000 mg | ORAL_TABLET | Freq: Every day | ORAL | Status: DC
  Administered 2014-09-02 – 2014-09-03 (×2): 5 mg via ORAL
  Filled 2014-09-01 (×3): qty 1

## 2014-09-01 MED ORDER — IRBESARTAN 300 MG PO TABS
300.0000 mg | ORAL_TABLET | Freq: Every day | ORAL | Status: DC
  Administered 2014-09-02 – 2014-09-03 (×2): 300 mg via ORAL
  Filled 2014-09-01 (×3): qty 1

## 2014-09-01 MED ORDER — FUROSEMIDE 10 MG/ML IJ SOLN
40.0000 mg | Freq: Once | INTRAMUSCULAR | Status: AC
  Administered 2014-09-01: 40 mg via INTRAVENOUS
  Filled 2014-09-01: qty 4

## 2014-09-01 MED ORDER — HYDROCORTISONE 20 MG PO TABS
20.0000 mg | ORAL_TABLET | Freq: Once | ORAL | Status: AC
  Administered 2014-09-01: 20 mg via ORAL
  Filled 2014-09-01: qty 1

## 2014-09-01 MED ORDER — FLUOXETINE HCL 20 MG PO CAPS
20.0000 mg | ORAL_CAPSULE | Freq: Once | ORAL | Status: AC
  Administered 2014-09-01: 20 mg via ORAL
  Filled 2014-09-01: qty 1

## 2014-09-01 MED ORDER — ISOSORBIDE MONONITRATE 15 MG HALF TABLET
15.0000 mg | ORAL_TABLET | Freq: Every day | ORAL | Status: DC
  Administered 2014-09-02 – 2014-09-03 (×2): 15 mg via ORAL
  Filled 2014-09-01 (×3): qty 1

## 2014-09-01 MED ORDER — NEBIVOLOL HCL 2.5 MG PO TABS
2.5000 mg | ORAL_TABLET | Freq: Once | ORAL | Status: AC
  Administered 2014-09-01: 2.5 mg via ORAL
  Filled 2014-09-01: qty 1

## 2014-09-01 MED ORDER — FLUOXETINE HCL 20 MG PO CAPS
20.0000 mg | ORAL_CAPSULE | Freq: Every morning | ORAL | Status: DC
  Administered 2014-09-02 – 2014-09-03 (×2): 20 mg via ORAL
  Filled 2014-09-01 (×2): qty 1

## 2014-09-01 NOTE — Telephone Encounter (Signed)
New message  Pt c/o Shortness Of Breath: STAT if SOB developed within the last 24 hours or pt is noticeably SOB on the phone  1. Are you currently SOB (can you hear that pt is SOB on the phone)? yes  2. How long have you been experiencing SOB? cople of months- getting worse and worse  3. Are you SOB when sitting or when up moving around? Moving around is worse recovers after sitting down.  4. Are you currently experiencing any other symptoms? Some sweating, little discomfort, occasionally in chest.

## 2014-09-01 NOTE — ED Notes (Signed)
Pt given [place] sandwich meal and sprite.

## 2014-09-01 NOTE — ED Notes (Signed)
Pt ambulated in hallway with standby assist. Pt O2 remained between 97-100%. Pt noted to be  slightly SOB a few minutes into walking. Pt was assisted back to bed w/o any distress.

## 2014-09-01 NOTE — Telephone Encounter (Signed)
Patient called about SOB. Patient stated she started having symptoms about 2 months ago, but in the last day it has gotten worse. Patient states she gets SOB with minimal activity. Patient's BP and HR are normal. Only time patient is relieved of SOB is when she is laying down. Patient has history of CABG and stated she had a "4th blockage that was left alone due to it being only in the tip."  Consulted [employer] PA (Flex), he recommends patient go to hospital for further evaluation. Patient verbalized understanding.

## 2014-09-01 NOTE — ED Notes (Signed)
MD at bedside. 

## 2014-09-01 NOTE — ED Notes (Signed)
Pt presents to department for evaluation of SOB. Ongoing for several months, reports breathing becomes labored with exertion. Denies pain. Respirations unlabored. Pt is alert and oriented x4.

## 2014-09-01 NOTE — ED Notes (Addendum)
Pt calls out tells this NT "I'm calling out to let ya'll know that i'm going home. I was sent by my cardiologist and there has been nothing done for her." Joe, [name] notified.

## 2014-09-01 NOTE — H&P (Addendum)
Patient ID: [name] MRN: [id number], DOB/AGE: [birth year]   Admit date: 09/01/2014   Primary Physician: Jerlyn Ly, MD Primary Cardiologist: [name]  Pt. Profile:  74 y/o female with h/o CAD s/p CAGB x 3 in 2013 presenting with DOE.   Problem List  Past Medical History  Diagnosis Date  . 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  last knee surgery dr. Elmyra Ricks gave large dose of hydrocortisal and avoided the hypotensive side effectas of addison's disease.  [name] PONV (postoperative nausea and vomiting)   . Coronary artery disease   . Angina   . Hypertension   . Dysrhythmia   . Shortness of breath   . Recurrent upper respiratory infection (URI)   . Hypothyroidism   . GERD (gastroesophageal reflux disease)   . Arthritis   . Anxiety   . Depression   . Addison's disease   . Lumbar radiculopathy 04/27/2014    Past Surgical History  Procedure Laterality Date  . Back surgery    . Tonsillectomy    . Diagnostic laparoscopy    . Cardiac catheterization    . Eye surgery    . Dilation and curettage of uterus    . Joint replacement    . Cholecystectomy    . Coronary artery bypass graft  06/26/2011    Procedure: CORONARY ARTERY BYPASS GRAFTING (CABG);  Surgeon: Melrose Nakayama, MD;  Location: [place];  Service: Open Heart Surgery;  Laterality: N/A;  times using Greater Saphenous Vein Graft harvested endoscopically from right leg     Allergies  Allergies  Allergen Reactions  . Percocet [Oxycodone-Acetaminophen]     Hallucination  . Sulfa Antibiotics Itching    Vaginal itching    HPI  The patient is a 74 y/o female, followed by [name], with a h/o CAD s/p CABG 06/2011( left internal mammary artery to LAD, saphenous vein graft to obtuse marginal, saphenous vein graft to distal right coronary), by Dr. Roxan Hockey. EF was normal at that time with at 65%. Also with a h/o Addison's disease, HTN, HLD and hypothyroidism.  Most recent OV with [name] was 12/25/13 and was felt stable from a CV standpoint.  She presents to the [facility] ED with complaints of worsening DOE. Symptoms have been ongoing for the last 2 months but has gotten worse over the last 1-2 days. She also had DOE in 2013 prior to CABG. She also has exertional jaw pain in 2013 but no recurrent jaw pain recently. She notes DOE with daily activities such as walking up the stairs, climbing inclines sweeping etc. No resting or exertional CP. No LEE, leg pain or prolonged travel. No orthopnea or PND. She reports full medication compliance.   CXR shows no acute processes . BNP mildly elevated at 302. Initial troponin is negative. EKG shows no acute symptoms. BP is moderately elevated in [id number] systolic. SCr is normal at 0.71. Mildly hypokalemia with K at 3.2.     Home Medications  Prior to Admission medications   Medication Sig Start Date End Date Taking? Authorizing Provider  acetaminophen (TYLENOL) 500 MG tablet Take 500 mg by mouth daily as needed for headache.   Yes Historical Provider, MD  amLODipine (NORVASC) 5 MG tablet Take 5 mg by mouth daily.   Yes Historical Provider, MD  aspirin EC 81 MG tablet Take 81 mg by mouth every morning.   Yes Historical Provider, MD  CYANOCOBALAMIN IJ Inject as directed  every 30 (thirty) days.   Yes Historical Provider, MD  FLUoxetine (PROZAC) 20 MG capsule Take 20 mg by mouth every morning.   Yes Historical Provider, MD  hydrocortisone (CORTEF) 10 MG tablet Take 10-20 mg by mouth 2 (two) times daily. 10 mg in the morning and 20 mg in the evening   Yes Historical Provider, MD  irbesartan (AVAPRO) 300 MG tablet Take 300 mg by mouth daily.   Yes Historical Provider, MD  levothyroxine (SYNTHROID, LEVOTHROID) 75 MCG tablet Take 75 mcg by mouth daily. 08/03/14  Yes Historical Provider, MD  nebivolol (BYSTOLIC) 5 MG tablet Take 2.5 mg by mouth at bedtime. Takes 2.5 mg daily   Yes Historical Provider, MD  omeprazole (PRILOSEC) 20  MG capsule Take 20 mg by mouth every morning.  11/19/13  Yes Historical Provider, MD  Probiotic Product (PROBIOTIC FORMULA PO) Take 1 capsule by mouth every morning. For upset stomach   Yes Historical Provider, MD  Vitamin D, Ergocalciferol, (DRISDOL) 50000 UNITS CAPS Take 50,000 Units by mouth 2 (two) times a week. Takes on Tuesdays and Fridays   Yes Historical Provider, MD  NITROSTAT 0.4 MG SL tablet ONE TABLET UNDER TONGUE AS NEEDED FOR CHEST PAIN AS DIRECTED    Belva Crome, MD    Family History  Family History  Problem Relation Age of Onset  . Heart attack Father     Social History  History   Social History  . Marital Status: Widowed    Spouse Name: N/A  . Number of Children: N/A  . Years of Education: N/A   Occupational History  . Not on file.   Social History Main Topics  . Smoking status: Never Smoker   . Smokeless tobacco: Not on file     Comment: pt exposed to second hand smoke for years.  . Alcohol Use: No  . Drug Use: No  . Sexual Activity: No   Other Topics Concern  . Not on file   Social History Narrative     Review of Systems General:  No chills, fever, night sweats or weight changes.  Cardiovascular:  No chest pain, dyspnea on exertion, edema, orthopnea, palpitations, paroxysmal nocturnal dyspnea. Dermatological: No rash, lesions/masses Respiratory: No cough, dyspnea Urologic: No hematuria, dysuria Abdominal:   No nausea, vomiting, diarrhea, bright red blood per rectum, melena, or hematemesis Neurologic:  No visual changes, wkns, changes in mental status. All other systems reviewed and are otherwise negative except as noted above.  Physical Exam  Blood pressure 116/59, pulse 65, temperature 97.8 F (36.6 C), temperature source Oral, resp. rate 20, height 5\' 4"  (1.626 m), weight 203 lb (92.08 kg), SpO2 96 %.  General: Pleasant, NAD Psych: Normal affect. Neuro: Alert and oriented X 3. Moves all extremities spontaneously. HEENT: Normal  Neck:  Supple without bruits mildly elevated . Lungs:  Resp regular and unlabored, CTA. Heart: RRR no s3, s4, or murmurs. Abdomen: Soft, non-tender, non-distended, BS + x 4.  Extremities: No clubbing, cyanosis or edema. DP/PT/Radials 2+ and equal bilaterally.  Labs  Troponin [facility] Test)  Recent Labs  09/01/14 1143  TROPIPOC 0.00   No results for input(s): CKTOTAL, CKMB, TROPONINI in the last 72 hours. Lab Results  Component Value Date   WBC 8.0 09/01/2014   HGB 12.9 09/01/2014   HCT 37.8 09/01/2014   MCV 90.6 09/01/2014   PLT 270 09/01/2014    Recent Labs Lab 09/01/14 1109  NA 141  K 3.2*  CL 105  CO2  25  BUN 15  CREATININE 0.71  CALCIUM 9.2  PROT 6.4*  BILITOT 1.1  ALKPHOS 69  ALT 18  AST 21  GLUCOSE 129*   Lab Results  Component Value Date   CHOL 155 04/21/2013   HDL 51.50 04/21/2013   LDLCALC 88 04/21/2013   TRIG 79.0 04/21/2013   No results found for: DDIMER   Radiology/Studies  Dg Chest 2 View  09/01/2014   CLINICAL DATA:  Shortness of breath for 2 months, worsening over the last week.  EXAM: CHEST  2 VIEW  COMPARISON:  Radiographs 07/26/2011.  CT 01/15/2012.  FINDINGS: There is stable mild cardiac enlargement status post median sternotomy and CABG. The mediastinal contours are stable. The lungs are clear. There is no pleural effusion or pneumothorax. No acute osseous findings seen.  IMPRESSION: Stable postoperative chest.  No acute cardiopulmonary process.   Electronically Signed   By: Richardean Sale M.D.   On: 09/01/2014 12:12    ECG  NSR. No acute abnormalities.     ASSESSMENT AND PLAN  1.  CAD: s/p CABG x 3 in 2013. No recent CP/ jaw pain. ? If DOE is anginal equivalent. Will need repeat LHC to redefine coronary anatomy. Will admit to telemetry. Continue to cycle cardiac enzymes x 3. Continue home meds. ASA, BB and ARB. Will add low dose Imdur. NPO at midnight. Will try to arrange for [facility] tomorrow.  2. DOE: ? If anginal equivalent. See  plan for ischemic eval above. Will also check a 2D echo to reassess LV systolic and diastolic function. Will give a dose of IV Lasix tonight. BNP is mildly elevated at 302. Reassess in the am.   3. HTN: moderately elevated. Continue home meds. Bystolic, amlodipine, irbesartan.   4. HLD: currently not on statin therapy. Check FLP in the am.   5. Hypokalemia: K is 3.2. Will plan to give dose of IV Lasix tonight. Will also give 40 mEQ of K-dur. Repeat BMP in the am.    Signed, Lyda Jester, PA-C 09/01/2014, 4:46 PM   I have seen, examined and evaluated the patient this PM along with Ms. Rosita Fire, [place].  After reviewing all the available data in the chart & discussed with her,  I agree with her findings, examination as well as impression recommendations.   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 She does not currently have is the jaw discomfort that happened just prior to her cardiac catheterization. She has noted progressively worsening exertional dyspnea but has not had the anginal pain. She has had minimal edema and no significant PND or orthopnea.  With progressively worsening exertional dyspnea as an ischemic mediated dyspnea, this would be consistent with unstable angina.  I'm concerned that her progressively worsening dyspnea to the point of bringing her to the emergency room may very well be a ischemic equivalent. The similarity to her previous symptoms is notable and therefore I think the most definitive way to evaluate her is with cardiac catheterization to exclude coronary disease. She has mild BP elevation which  would suggest at least a component of diastolic dysfunction which  could easily be ischemic mediated. Would also evaluate EF and echocardiogram.  Continue current home medications and monitor blood pressure closely. We'll dose once with IV Lasix. She is not currently on a statin will be to gravity that situation based on fasting lipid panel.  Leonie Man, M.D., M.S. Interventional Cardiologist   Pager # [phone]

## 2014-09-01 NOTE — ED Provider Notes (Signed)
CSN: [id number]     Arrival date & time 09/01/14  1051 History   First MD Initiated Contact with Patient 09/01/14 1202     Chief Complaint  Patient presents with  . Shortness of Breath     (Consider location/radiation/quality/duration/timing/severity/associated sxs/prior Treatment) HPI [name] is a 74 year-old female with past medical history of CAD, CABG 06/2011, GERD who presents the ER, shortness of breath. Patient states over the past 2 months she has had a progression of worsening dyspnea on exertion. Patient states that she spoke with her cardiologist, [name] who recommended she come to the ER for further evaluation. Patient states her symptoms are only present on exertion, she states initially she was feeling symptoms while walking down the hall, now states she becomes on walking from her bed to the bathroom. Patient denies having any associated signs and symptoms with her dyspnea, states she denies chest pain, dizziness, weakness, syncope, diaphoresis, nausea, vomiting, abdominal pain. Patient statesShe typically lies flat at night, her symptoms are alleviated with rest. Patient states she experienced shortness of breath and jaw pain just prior to her CABG. patient denies any jaw pain with these episodes of shortness of breath currently.  Past Medical History  Diagnosis Date  . Coronary artery disease   . Angina   . Hypertension   . Dysrhythmia   . Shortness of breath   . Recurrent upper respiratory infection (URI)   . Hypothyroidism   . GERD (gastroesophageal reflux disease)   . Anxiety   . Depression   . Addison's disease   . Lumbar radiculopathy 04/27/2014  . 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  last knee surgery dr. Elmyra Ricks gave large dose of hydrocortisal and avoided the hypotensive side effectas of addison's disease.  [name] PONV (postoperative nausea and vomiting)   . Hyperlipemia   . Arthritis     "everywhere"  .  Chronic lower back pain    Past Surgical History  Procedure Laterality Date  . Back surgery    . Tonsillectomy    . Diagnostic laparoscopy    . Dilation and curettage of uterus    . Joint replacement    . Laparoscopic cholecystectomy    . Carpal tunnel release Right   . Knee arthroscopy Bilateral   . Total knee arthroplasty Bilateral   . Revision total knee arthroplasty Right   . Anterior lumbar fusion  1980    L4-5  . Cataract extraction, bilateral Bilateral   . Cardiac catheterization  2013  . Coronary artery bypass graft  06/26/2011    Procedure: CORONARY ARTERY BYPASS GRAFTING (CABG);  Surgeon: Melrose Nakayama, MD;  Location: [place];  Service: Open Heart Surgery;  Laterality: N/A;  times using Greater Saphenous Vein Graft harvested endoscopically from right leg   Family History  Problem Relation Age of Onset  . Heart attack Father    History  Substance Use Topics  . Smoking status: Passive Smoke Exposure - Never Smoker -- 0.00 packs/day for 0 years  . Smokeless tobacco: Never Used     Comment: .  [name] Alcohol Use: No   OB History    No data available     Review of Systems  Constitutional: Negative for fever.  HENT: Negative for trouble swallowing.   Eyes: Negative for visual disturbance.  Respiratory: Positive for shortness of breath.   Cardiovascular: Negative for chest pain.  Gastrointestinal: Negative for nausea, vomiting and abdominal pain.  Genitourinary: Negative for dysuria.  Musculoskeletal: Negative for neck pain.  Skin: Negative for rash.  Neurological: Negative for dizziness, weakness and numbness.  Psychiatric/Behavioral: Negative.     Allergies  Percocet and Sulfa antibiotics  Home Medications   Prior to Admission medications   Medication Sig Start Date End Date Taking? Authorizing Provider  acetaminophen (TYLENOL) 500 MG tablet Take 500 mg by mouth daily as needed for headache.   Yes Historical Provider, MD  amLODipine (NORVASC) 5 MG tablet  Take 5 mg by mouth daily.   Yes Historical Provider, MD  aspirin EC 81 MG tablet Take 81 mg by mouth every morning.   Yes Historical Provider, MD  CYANOCOBALAMIN IJ Inject as directed every 30 (thirty) days.   Yes Historical Provider, MD  FLUoxetine (PROZAC) 20 MG capsule Take 20 mg by mouth every morning.   Yes Historical Provider, MD  hydrocortisone (CORTEF) 10 MG tablet Take 10-20 mg by mouth 2 (two) times daily. 10 mg in the morning and 20 mg in the evening   Yes Historical Provider, MD  irbesartan (AVAPRO) 300 MG tablet Take 300 mg by mouth daily.   Yes Historical Provider, MD  levothyroxine (SYNTHROID, LEVOTHROID) 75 MCG tablet Take 75 mcg by mouth daily. 08/03/14  Yes Historical Provider, MD  nebivolol (BYSTOLIC) 5 MG tablet Take 2.5 mg by mouth at bedtime. Takes 2.5 mg daily   Yes Historical Provider, MD  omeprazole (PRILOSEC) 20 MG capsule Take 20 mg by mouth every morning.  11/19/13  Yes Historical Provider, MD  Probiotic Product (PROBIOTIC FORMULA PO) Take 1 capsule by mouth every morning. For upset stomach   Yes Historical Provider, MD  Vitamin D, Ergocalciferol, (DRISDOL) 50000 UNITS CAPS Take 50,000 Units by mouth 2 (two) times a week. Takes on Tuesdays and Fridays   Yes Historical Provider, MD  NITROSTAT 0.4 MG SL tablet ONE TABLET UNDER TONGUE AS NEEDED FOR CHEST PAIN AS DIRECTED    Belva Crome, MD   BP 134/56 mmHg  Pulse 60  Temp(Src) 97.9 F (36.6 C) (Oral)  Resp 18  Ht 5\' 4"  (1.626 m)  Wt 202 lb 2.6 oz (91.7 kg)  BMI 34.68 kg/m2  SpO2 96% Physical Exam  Constitutional: She is oriented to person, place, and time. She appears well-developed and well-nourished. No distress.  HENT:  Head: Normocephalic and atraumatic.  Mouth/Throat: Oropharynx is clear and moist. No oropharyngeal exudate.  Eyes: Right eye exhibits no discharge. Left eye exhibits no discharge. No scleral icterus.  Neck: Normal range of motion.  Cardiovascular: Normal rate and regular rhythm.  Exam reveals  gallop and S4.   No murmur heard. Pulmonary/Chest: Effort normal and breath sounds normal. No respiratory distress.  Abdominal: Soft. There is no tenderness.  Musculoskeletal: Normal range of motion. She exhibits no edema or tenderness.  Neurological: She is alert and oriented to person, place, and time. She has normal strength. No cranial nerve deficit or sensory deficit. Coordination normal. GCS eye subscore is 4. GCS verbal subscore is 5. GCS motor subscore is 6.  Patient fully alert, answering questions appropriately in full, clear sentences. Cranial nerves II through XII grossly intact. Motor strength 5 out of 5 in all major muscle groups of upper and lower extremities. Distal sensation intact.   Skin: Skin is warm and dry. No rash noted. She is not diaphoretic.  Psychiatric: She has a normal mood and affect.  Nursing note and vitals reviewed.   ED Course  Procedures (including critical care time) Labs  Review Labs Reviewed  COMPREHENSIVE METABOLIC PANEL - Abnormal; Notable for the following:    Potassium 3.2 (*)    Glucose, Bld 129 (*)    Total Protein 6.4 (*)    Albumin 3.3 (*)    All other components within normal limits  BRAIN NATRIURETIC PEPTIDE - Abnormal; Notable for the following:    B Natriuretic Peptide 302.6 (*)    All other components within normal limits  BASIC METABOLIC PANEL - Abnormal; Notable for the following:    Glucose, Bld 155 (*)    Calcium 8.6 (*)    All other components within normal limits  LIPID PANEL - Abnormal; Notable for the following:    HDL 40 (*)    All other components within normal limits  CBC WITH DIFFERENTIAL/PLATELET  TROPONIN I  TROPONIN I  HEMOGLOBIN AND HEMATOCRIT, BLOOD  OCCULT BLOOD X 1 CARD TO LAB, STOOL  I-STAT TROPOININ, ED    Imaging Review Dg Chest 2 View  09/01/2014   CLINICAL DATA:  Shortness of breath for 2 months, worsening over the last week.  EXAM: CHEST  2 VIEW  COMPARISON:  Radiographs 07/26/2011.  CT 01/15/2012.   FINDINGS: There is stable mild cardiac enlargement status post median sternotomy and CABG. The mediastinal contours are stable. The lungs are clear. There is no pleural effusion or pneumothorax. No acute osseous findings seen.  IMPRESSION: Stable postoperative chest.  No acute cardiopulmonary process.   Electronically Signed   By: Richardean Sale M.D.   On: 09/01/2014 12:12     EKG Interpretation   Date/Time:  Tuesday Sep 01 2014 10:55:27 EDT Ventricular Rate:  72 PR Interval:  176 QRS Duration: 106 QT Interval:  396 QTC Calculation: 433 R Axis:   79 Text Interpretation:  Normal sinus rhythm Incomplete right bundle branch  block Nonspecific ST and T wave abnormality Abnormal ECG No significant  change since last tracing Confirmed by Debby Freiberg [phone]) on  09/01/2014 1:29:42 PM      MDM   Final diagnoses:  DOE (dyspnea on exertion)    Patient here with dyspnea on exertion over the past 2 months. Workup here reveals elevated BNP in light of dyspnea on exertion with mild bibasilar at crackles, mild pedal edema. Concern for new onset of CHF. Patient ambulated in the hall without hypoxia noted. Patient maintaining O2 saturation over 95%. Spoke with Dr. Ellyn Hack with cardiology in consultation patient's case, Dr. Ellyn Hack states cardiology will come to assess patient in the ED for admission.  The patient appears reasonably stabilized for admission considering the current resources, flow, and capabilities available in the ED at this time, and I doubt any other [facility] requiring further screening and/or treatment in the ED prior to admission.  Signed,  Dahlia Bailiff, PA-C 3:29 PM  Patient seen and discussed with Dr. Debby Freiberg, MD  Dahlia Bailiff, PA-C 09/02/14 1529  Debby Freiberg, MD 09/05/14 [phone]

## 2014-09-02 ENCOUNTER — Inpatient Hospital Stay (HOSPITAL_BASED_OUTPATIENT_CLINIC_OR_DEPARTMENT_OTHER): Payer: Medicare Other

## 2014-09-02 ENCOUNTER — Encounter (HOSPITAL_COMMUNITY)
Admission: EM | Disposition: A | Discharge status: Home / Self Care | Payer: Medicare Other | Source: Home / Self Care | Attending: Emergency Medicine

## 2014-09-02 DIAGNOSIS — I25110 Atherosclerotic heart disease of native coronary artery with unstable angina pectoris: Secondary | ICD-10-CM | POA: Diagnosis not present

## 2014-09-02 DIAGNOSIS — E876 Hypokalemia: Secondary | ICD-10-CM | POA: Diagnosis not present

## 2014-09-02 DIAGNOSIS — R0600 Dyspnea, unspecified: Secondary | ICD-10-CM

## 2014-09-02 DIAGNOSIS — R0609 Other forms of dyspnea: Secondary | ICD-10-CM | POA: Diagnosis not present

## 2014-09-02 DIAGNOSIS — E785 Hyperlipidemia, unspecified: Secondary | ICD-10-CM | POA: Diagnosis not present

## 2014-09-02 HISTORY — PX: CARDIAC CATHETERIZATION: SHX172

## 2014-09-02 LAB — LIPID PANEL
CHOLESTEROL: 100 mg/dL (ref 0–200)
HDL: 40 mg/dL — ABNORMAL LOW (ref >40)
LDL Cholesterol: 44 mg/dL (ref 0–99)
Total CHOL/HDL Ratio: 2.5 RATIO
Triglycerides: 78 mg/dL (ref <150)
VLDL: 16 mg/dL (ref 0–40)

## 2014-09-02 LAB — TROPONIN I
Troponin I: <0.03 ng/mL (ref <0.031)
Troponin I: <0.03 ng/mL (ref <0.031)

## 2014-09-02 LAB — BASIC METABOLIC PANEL
Anion gap: 9 (ref 5–15)
BUN: 12 mg/dL (ref 6–20)
CALCIUM: 8.6 mg/dL — AB (ref 8.9–10.3)
CO2: 28 mmol/L (ref 22–32)
CREATININE: 0.82 mg/dL (ref 0.44–1.00)
Chloride: 102 mmol/L (ref 101–111)
GFR calc Af Amer: >60 mL/min (ref >60)
Glucose, Bld: 155 mg/dL — ABNORMAL HIGH (ref 65–99)
POTASSIUM: 3.7 mmol/L (ref 3.5–5.1)
Sodium: 139 mmol/L (ref 135–145)

## 2014-09-02 LAB — HEMOGLOBIN AND HEMATOCRIT, BLOOD
HCT: 37.2 % (ref 36.0–46.0)
Hemoglobin: 12.7 g/dL (ref 12.0–15.0)

## 2014-09-02 LAB — OCCULT BLOOD X 1 CARD TO LAB, STOOL: Fecal Occult Bld: NEGATIVE

## 2014-09-02 SURGERY — LEFT HEART CATH AND CORONARY ANGIOGRAPHY
Anesthesia: LOCAL

## 2014-09-02 MED ORDER — SODIUM CHLORIDE 0.9 % IJ SOLN: 3.0000 mL | Freq: Two times a day (BID) | INTRAMUSCULAR | Status: DC

## 2014-09-02 MED ORDER — PERFLUTREN LIPID MICROSPHERE
1.0000 mL | INTRAVENOUS | Status: AC | PRN
  Administered 2014-09-02: 2 mL via INTRAVENOUS
  Filled 2014-09-02: qty 10

## 2014-09-02 MED ORDER — HEPARIN SODIUM (PORCINE) 1000 UNIT/ML IJ SOLN
INTRAMUSCULAR | Status: AC
  Filled 2014-09-02: qty 1

## 2014-09-02 MED ORDER — SODIUM CHLORIDE 0.9 % IV SOLN
250.0000 mL | INTRAVENOUS | Status: DC | PRN
Start: 2014-09-02 — End: 2014-09-02

## 2014-09-02 MED ORDER — SODIUM CHLORIDE 0.9 % IV SOLN: 250.0000 mL | INTRAVENOUS | Status: DC | PRN

## 2014-09-02 MED ORDER — SODIUM CHLORIDE 0.9 % IJ SOLN: 3.0000 mL | INTRAMUSCULAR | Status: DC | PRN

## 2014-09-02 MED ORDER — SODIUM CHLORIDE 0.9 % WEIGHT BASED INFUSION
1.0000 mL/kg/h | INTRAVENOUS | Status: AC
  Administered 2014-09-02: 1 mL/kg/h via INTRAVENOUS

## 2014-09-02 MED ORDER — LIDOCAINE HCL (PF) 1 % IJ SOLN
INTRAMUSCULAR | Status: AC
  Filled 2014-09-02: qty 30

## 2014-09-02 MED ORDER — HEPARIN (PORCINE) IN NACL 2-0.9 UNIT/ML-% IJ SOLN
INTRAMUSCULAR | Status: AC
  Filled 2014-09-02: qty 1500

## 2014-09-02 MED ORDER — ASPIRIN 81 MG PO CHEW: 81.0000 mg | CHEWABLE_TABLET | ORAL | Status: DC

## 2014-09-02 MED ORDER — IOHEXOL 350 MG/ML SOLN
INTRAVENOUS | Status: DC | PRN
  Administered 2014-09-02: 100 mL via INTRAVENOUS

## 2014-09-02 MED ORDER — VERAPAMIL HCL 2.5 MG/ML IV SOLN
INTRAVENOUS | Status: DC | PRN
  Administered 2014-09-02: 16:00:00 via INTRA_ARTERIAL

## 2014-09-02 MED ORDER — FENTANYL CITRATE (PF) 100 MCG/2ML IJ SOLN
INTRAMUSCULAR | Status: AC
  Filled 2014-09-02: qty 2

## 2014-09-02 MED ORDER — FENTANYL CITRATE (PF) 100 MCG/2ML IJ SOLN
INTRAMUSCULAR | Status: DC | PRN
  Administered 2014-09-02: 50 ug via INTRAVENOUS

## 2014-09-02 MED ORDER — MIDAZOLAM HCL 2 MG/2ML IJ SOLN
INTRAMUSCULAR | Status: AC
  Filled 2014-09-02: qty 2

## 2014-09-02 MED ORDER — ALPRAZOLAM 0.25 MG PO TABS
0.2500 mg | ORAL_TABLET | Freq: Three times a day (TID) | ORAL | Status: DC | PRN
Start: 2014-09-02 — End: 2014-09-03

## 2014-09-02 MED ORDER — VERAPAMIL HCL 2.5 MG/ML IV SOLN
INTRAVENOUS | Status: AC
Start: 2014-09-02 — End: 2014-09-02
  Filled 2014-09-02: qty 2

## 2014-09-02 MED ORDER — MIDAZOLAM HCL 2 MG/2ML IJ SOLN
INTRAMUSCULAR | Status: DC | PRN
  Administered 2014-09-02: 1 mg via INTRAVENOUS

## 2014-09-02 MED ORDER — SODIUM CHLORIDE 0.9 % IV SOLN
INTRAVENOUS | Status: DC
Start: 2014-09-03 — End: 2014-09-02

## 2014-09-02 MED ORDER — SODIUM CHLORIDE 0.9 % IJ SOLN
3.0000 mL | Freq: Two times a day (BID) | INTRAMUSCULAR | Status: DC
  Administered 2014-09-02: 3 mL via INTRAVENOUS

## 2014-09-02 MED ORDER — HEPARIN SODIUM (PORCINE) 1000 UNIT/ML IJ SOLN
INTRAMUSCULAR | Status: DC | PRN
  Administered 2014-09-02: 5000 [IU] via INTRAVENOUS

## 2014-09-02 MED ORDER — NITROGLYCERIN 1 MG/10 ML FOR IR/CATH LAB
INTRA_ARTERIAL | Status: AC
  Filled 2014-09-02: qty 10

## 2014-09-02 SURGICAL SUPPLY — 13 items
CATH INFINITI 5 FR IM (CATHETERS) ×2 IMPLANT
CATH OPTITORQUE TIG 4.0 5F (CATHETERS) ×2 IMPLANT
DEVICE RAD COMP TR BAND LRG (VASCULAR PRODUCTS) ×2 IMPLANT
GLIDESHEATH SLEND SS 6F .021 (SHEATH) ×2 IMPLANT
KIT HEART LEFT (KITS) ×2 IMPLANT
NEEDLE PERC ENTRY 21G 2.5CM (NEEDLE) ×2 IMPLANT
PACK CARDIAC CATHETERIZATION (CUSTOM PROCEDURE TRAY) ×2 IMPLANT
SYR MEDRAD MARK V 150ML (SYRINGE) ×2 IMPLANT
TRANSDUCER W/STOPCOCK (MISCELLANEOUS) ×2 IMPLANT
TUBING CIL FLEX 10 FLL-RA (TUBING) ×2 IMPLANT
WIRE HI TORQ VERSACORE-J 145CM (WIRE) ×2 IMPLANT
WIRE MICROINTRODUCER 60CM (WIRE) ×2 IMPLANT
WIRE SAFE-T 1.5MM-J .035X260CM (WIRE) ×2 IMPLANT

## 2014-09-02 NOTE — Progress Notes (Signed)
Patient Profile: 74 y/o female with h/o CAD s/p CAGB x 3 in 2013 presenting with similar signs and symptoms of exertional dyspnea that she had prior to her CABG.  Subjective: No resting dyspnea. Unable to gage response to dose of IV Lasix because she has not ambulated much since admission. Denies chest/ jaw pain. She reports diarrhea x 4 followed by hematochezia x 1. Denies straining. No h/o hemorrhoids. She now is very anxious and asking for something for her anxiety.   Objective: Vital signs in last 24 hours: Temp:  [97.8 F (36.6 C)-98.1 F (36.7 C)] 97.9 F (36.6 C) (06/01 0352) Pulse Rate:  [56-71] 60 (06/01 0352) Resp:  [11-23] 18 (06/01 0352) BP: (116-174)/(40-81) 126/54 mmHg (06/01 0352) SpO2:  [92 %-97 %] 96 % (06/01 0352) Weight:  [203 lb (92.08 kg)-207 lb 3.7 oz (94 kg)] 207 lb 3.7 oz (94 kg) (05/31 2143) Last BM Date: 09/01/14  Intake/Output from previous day:   Intake/Output this shift:    Medications Current Facility-Administered Medications  Medication Dose Route Frequency Provider Last Rate Last Dose  . acetaminophen (TYLENOL) tablet 500 mg  500 mg Oral Daily PRN Consuelo Pandy, PA-C   500 mg at 09/01/14 2301  . amLODipine (NORVASC) tablet 5 mg  5 mg Oral Daily Brittainy Erie Noe, PA-C   5 mg at 09/01/14 2245  . aspirin EC tablet 81 mg  81 mg Oral Daily Brittainy Erie Noe, PA-C      . FLUoxetine (PROZAC) capsule 20 mg  20 mg Oral q morning - 10a Brittainy M Simmons, PA-C      . heparin injection 5,000 Units  5,000 Units Subcutaneous 3 times per day Consuelo Pandy, PA-C   5,000 Units at 09/02/14 9983  . irbesartan (AVAPRO) tablet 300 mg  300 mg Oral Daily Consuelo Pandy, PA-C   300 mg at 09/01/14 2243  . isosorbide mononitrate (IMDUR) 24 hr tablet 15 mg  15 mg Oral Daily Brittainy Erie Noe, PA-C   15 mg at 09/01/14 2250  . levothyroxine (SYNTHROID, LEVOTHROID) tablet 75 mcg  75 mcg Oral Q breakfast Consuelo Pandy, PA-C   75 mcg at 09/02/14  3825  . levothyroxine (SYNTHROID, LEVOTHROID) tablet 75 mcg  75 mcg Oral Once Ellwood Dense, MD   75 mcg at 09/01/14 1958  . nebivolol (BYSTOLIC) tablet 2.5 mg  2.5 mg Oral QHS Brittainy Erie Noe, PA-C   2.5 mg at 09/01/14 2245  . nitroGLYCERIN (NITROSTAT) SL tablet 0.4 mg  0.4 mg Sublingual Q5 min PRN Brittainy M Simmons, PA-C      . ondansetron [facility]) injection 4 mg  4 mg Intravenous Q6H PRN Brittainy M Simmons, PA-C      . pantoprazole (PROTONIX) EC tablet 40 mg  40 mg Oral Daily Brittainy Erie Noe, PA-C   40 mg at 09/01/14 2247    PE: General appearance: alert, cooperative and no distress Neck: no carotid bruit and no JVD Lungs: clear to auscultation bilaterally Heart: regular rate and rhythm, S1, S2 normal, no murmur, click, rub or gallop Extremities: no LEE Pulses: 2+ and symmetric Skin: warm and dry Neurologic: Grossly normal  Lab Results:   Recent Labs  09/01/14 1109  WBC 8.0  HGB 12.9  HCT 37.8  PLT 270   BMET  Recent Labs  09/01/14 1109 09/02/14 0346  NA 141 139  K 3.2* 3.7  CL 105 102  CO2 25 28  GLUCOSE 129* 155*  BUN 15 12  CREATININE 0.71 0.82  CALCIUM 9.2 8.6*   PT/INR No results for input(s): LABPROT, INR in the last 72 hours. 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 CAD: s/p CABG x 3 in 2013. No recent CP/ jaw pain. ? If DOE is anginal equivalent. Cardiac enzymes are negative. Plan is for [facility]  for definitive assessment of coronary anatomy.  Continue home meds. ASA, BB, ARB and imdur.   2. DOE: ? If anginal equivalent.  See plan for ischemic eval above. Will also check a 2D echo to reassess LV systolic and diastolic function. Results pending.  BNP is mildly elevated at 302. She was given IV Lasix x 1 last PM. Unable to gage response as she has not ambulated much.   3. HTN: well controlled today at 126/54. Continue home meds. Bystolic, amlodipine, irbesartan.   4. H/O HLD: currently not on statin therapy. However, FLP shows LDL is well controlled and at goal of <70 at 44 mg/dL.   5. Hypokalemia: resolved after supplementation with K-Dur. K is 3.7 today.   6. Hematochezia: in the setting of diarrhea x 4. Now abdominal pain. Now with rectal discomfort ? If hemorrhoid related. Will recheck H/H. Continue Protonix. Monitor for recurrence.   7. Anxiety: PRN xanax.    LOS: 1 day    Brittainy M. Ladoris Gene 09/02/2014 10:35 AM  Patient seen, examined. Available data reviewed. Agree with findings, assessment, and plan as outlined by Lyda Jester, PA-C. Note of Dr. Ellyn Hack reviewed with concern of progressive dyspnea as an unstable angina equivalent. This has been similar to her previous ischemic symptoms. Plans for cardiac catheterization and possible angioplasty/stenting today. Reviewed risks and indications with the patient and her friend who is at the bedside. They understand and agree to proceed. The patient has diarrhea and frequent BMs associated with anxiety. She had some rectal bleeding this morning with her fourth bowel movement. Agree that this is probably related to irritation or hemorrhoidal bleeding. Will repeat a CBC and monitor symptoms. Further disposition pending cardiac catheterization and echocardiogram results.  Sherren Mocha, M.D. 09/02/2014 11:54 AM

## 2014-09-02 NOTE — Progress Notes (Signed)
Rectal - heme negative brown stool. No obvious hemorrhoids or lesion noted. H/H just now being drawn  Kerin Ransom PA-C 09/02/2014 12:09 PM

## 2014-09-02 NOTE — Progress Notes (Signed)
Pt off unit to cath lab. Francis Gaines Jamesen Stahnke RN.

## 2014-09-02 NOTE — Interval H&P Note (Signed)
History and Physical Interval Note:  09/02/2014 3:52 PM  [name]  has presented today for surgery, with the diagnosis of unstable angina  The various methods of treatment have been discussed with the patient and family. After consideration of risks, benefits and other options for treatment, the patient has consented to  Procedure(s): Left Heart Cath and Coronary Angiography (N/A) as a surgical intervention .  The patient's history has been reviewed, patient examined, no change in status, stable for surgery.  I have reviewed the patient's chart and labs.  Questions were answered to the patient's satisfaction.     Kathlyn Sacramento

## 2014-09-02 NOTE — Progress Notes (Signed)
Pt TR band removed at 1840 and clean dry sterile dsg applied to left hand per protocol; post TR band removal protocol followed; pt VSS; left radial remain level 0 with not s/s hematoma, bruising or active bleeding. Reported off to incoming RN. Francis Gaines Deannah Rossi RN.

## 2014-09-02 NOTE — H&P (View-Only) (Signed)
Patient Profile: 74 y/o female with h/o CAD s/p CAGB x 3 in 2013 presenting with similar signs and symptoms of exertional dyspnea that she had prior to her CABG.  Subjective: No resting dyspnea. Unable to gage response to dose of IV Lasix because she has not ambulated much since admission. Denies chest/ jaw pain. She reports diarrhea x 4 followed by hematochezia x 1. Denies straining. No h/o hemorrhoids. She now is very anxious and asking for something for her anxiety.   Objective: Vital signs in last 24 hours: Temp:  [97.8 F (36.6 C)-98.1 F (36.7 C)] 97.9 F (36.6 C) (06/01 0352) Pulse Rate:  [56-71] 60 (06/01 0352) Resp:  [11-23] 18 (06/01 0352) BP: (116-174)/(40-81) 126/54 mmHg (06/01 0352) SpO2:  [92 %-97 %] 96 % (06/01 0352) Weight:  [203 lb (92.08 kg)-207 lb 3.7 oz (94 kg)] 207 lb 3.7 oz (94 kg) (05/31 2143) Last BM Date: 09/01/14  Intake/Output from previous day:   Intake/Output this shift:    Medications Current Facility-Administered Medications  Medication Dose Route Frequency Provider Last Rate Last Dose  . acetaminophen (TYLENOL) tablet 500 mg  500 mg Oral Daily PRN Consuelo Pandy, PA-C   500 mg at 09/01/14 2301  . amLODipine (NORVASC) tablet 5 mg  5 mg Oral Daily Brittainy Erie Noe, PA-C   5 mg at 09/01/14 2245  . aspirin EC tablet 81 mg  81 mg Oral Daily Brittainy Erie Noe, PA-C      . FLUoxetine (PROZAC) capsule 20 mg  20 mg Oral q morning - 10a Brittainy M Simmons, PA-C      . heparin injection 5,000 Units  5,000 Units Subcutaneous 3 times per day Consuelo Pandy, PA-C   5,000 Units at 09/02/14 4431  . irbesartan (AVAPRO) tablet 300 mg  300 mg Oral Daily Consuelo Pandy, PA-C   300 mg at 09/01/14 2243  . isosorbide mononitrate (IMDUR) 24 hr tablet 15 mg  15 mg Oral Daily Brittainy Erie Noe, PA-C   15 mg at 09/01/14 2250  . levothyroxine (SYNTHROID, LEVOTHROID) tablet 75 mcg  75 mcg Oral Q breakfast Consuelo Pandy, PA-C   75 mcg at 09/02/14  5400  . levothyroxine (SYNTHROID, LEVOTHROID) tablet 75 mcg  75 mcg Oral Once Ellwood Dense, MD   75 mcg at 09/01/14 1958  . nebivolol (BYSTOLIC) tablet 2.5 mg  2.5 mg Oral QHS Brittainy Erie Noe, PA-C   2.5 mg at 09/01/14 2245  . nitroGLYCERIN (NITROSTAT) SL tablet 0.4 mg  0.4 mg Sublingual Q5 min PRN Brittainy M Simmons, PA-C      . ondansetron [facility]) injection 4 mg  4 mg Intravenous Q6H PRN Brittainy M Simmons, PA-C      . pantoprazole (PROTONIX) EC tablet 40 mg  40 mg Oral Daily Brittainy Erie Noe, PA-C   40 mg at 09/01/14 2247    PE: General appearance: alert, cooperative and no distress Neck: no carotid bruit and no JVD Lungs: clear to auscultation bilaterally Heart: regular rate and rhythm, S1, S2 normal, no murmur, click, rub or gallop Extremities: no LEE Pulses: 2+ and symmetric Skin: warm and dry Neurologic: Grossly normal  Lab Results:   Recent Labs  09/01/14 1109  WBC 8.0  HGB 12.9  HCT 37.8  PLT 270   BMET  Recent Labs  09/01/14 1109 09/02/14 0346  NA 141 139  K 3.2* 3.7  CL 105 102  CO2 25 28  GLUCOSE 129* 155*  BUN 15 12  CREATININE 0.71 0.82  CALCIUM 9.2 8.6*   PT/INR No results for input(s): LABPROT, INR in the last 72 hours. 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 CAD: s/p CABG x 3 in 2013. No recent CP/ jaw pain. ? If DOE is anginal equivalent. Cardiac enzymes are negative. Plan is for [facility]  for definitive assessment of coronary anatomy.  Continue home meds. ASA, BB, ARB and imdur.   2. DOE: ? If anginal equivalent.  See plan for ischemic eval above. Will also check a 2D echo to reassess LV systolic and diastolic function. Results pending.  BNP is mildly elevated at 302. She was given IV Lasix x 1 last PM. Unable to gage response as she has not ambulated much.   3. HTN: well controlled today at 126/54. Continue home meds. Bystolic, amlodipine, irbesartan.   4. H/O HLD: currently not on statin therapy. However, FLP shows LDL is well controlled and at goal of <70 at 44 mg/dL.   5. Hypokalemia: resolved after supplementation with K-Dur. K is 3.7 today.   6. Hematochezia: in the setting of diarrhea x 4. Now abdominal pain. Now with rectal discomfort ? If hemorrhoid related. Will recheck H/H. Continue Protonix. Monitor for recurrence.   7. Anxiety: PRN xanax.    LOS: 1 day    Brittainy M. Ladoris Gene 09/02/2014 10:35 AM  Patient seen, examined. Available data reviewed. Agree with findings, assessment, and plan as outlined by Lyda Jester, PA-C. Note of Dr. Ellyn Hack reviewed with concern of progressive dyspnea as an unstable angina equivalent. This has been similar to her previous ischemic symptoms. Plans for cardiac catheterization and possible angioplasty/stenting today. Reviewed risks and indications with the patient and her friend who is at the bedside. They understand and agree to proceed. The patient has diarrhea and frequent BMs associated with anxiety. She had some rectal bleeding this morning with her fourth bowel movement. Agree that this is probably related to irritation or hemorrhoidal bleeding. Will repeat a CBC and monitor symptoms. Further disposition pending cardiac catheterization and echocardiogram results.  Sherren Mocha, M.D. 09/02/2014 11:54 AM

## 2014-09-02 NOTE — Progress Notes (Signed)
Echocardiogram 2D Echocardiogram has been performed.  [name] 09/02/2014, 11:15 AM

## 2014-09-02 NOTE — Progress Notes (Addendum)
Pt arrived back to the unit from cath lab 1650. VSS; telemetry reapplied and verified; Left radial has 12cc air per report; site level 0, no active bleeding, bruising or hematoma noted; pt educated on elevating and not bending or twisting hand which pt voices understanding and denies any questions. Will closely monitor pt per protocol. Francis Gaines Wilver Tignor RN.

## 2014-09-03 ENCOUNTER — Encounter (HOSPITAL_COMMUNITY): Payer: Self-pay | Admitting: Cardiovascular Disease

## 2014-09-03 DIAGNOSIS — R0609 Other forms of dyspnea: Secondary | ICD-10-CM | POA: Diagnosis not present

## 2014-09-03 DIAGNOSIS — K921 Melena: Secondary | ICD-10-CM | POA: Diagnosis not present

## 2014-09-03 MED ORDER — ISOSORBIDE MONONITRATE ER 30 MG PO TB24: 15.0000 mg | ORAL_TABLET | Freq: Every day | ORAL | Status: DC

## 2014-09-03 MED FILL — Heparin Sodium (Porcine) 2 Unit/ML in Sodium Chloride 0.9%: INTRAMUSCULAR | Qty: 1500 | Status: AC

## 2014-09-03 MED FILL — Lidocaine HCl Local Preservative Free (PF) Inj 1%: INTRAMUSCULAR | Qty: 30 | Status: AC

## 2014-09-03 NOTE — Progress Notes (Signed)
    Subjective:  No chest pain. No c/o this am.  Objective:  Vital Signs in the last 24 hours: Temp:  [97.7 F (36.5 C)-98.2 F (36.8 C)] 97.9 F (36.6 C) (06/02 0423) Pulse Rate:  [0-76] 51 (06/02 0423) Resp:  [0-26] 18 (06/02 0423) BP: (88-134)/(47-77) 113/55 mmHg (06/02 0423) SpO2:  [0 %-100 %] 94 % (06/02 0423) Weight:  [202 lb 2.6 oz (91.7 kg)] 202 lb 2.6 oz (91.7 kg) (06/01 1301)  Intake/Output from previous day:    Physical Exam: Pt is alert and oriented, NAD HEENT: normal Neck: JVP - normal, carotids 2+= without bruits Lungs: CTA bilaterally CV: RRR without murmur or gallop Abd: soft, NT, Positive BS, no hepatomegaly Ext: no C/C/E, distal pulses intact and equal, left radial site clear Skin: warm/dry no rash   Lab Results:  Recent Labs  09/01/14 1109 09/02/14 1029  WBC 8.0  --   HGB 12.9 12.7  PLT 270  --     Recent Labs  09/01/14 1109 09/02/14 0346  NA 141 139  K 3.2* 3.7  CL 105 102  CO2 25 28  GLUCOSE 129* 155*  BUN 15 12  CREATININE 0.71 0.82    Recent Labs  09/02/14 0346 09/02/14 0839  TROPONINI <0.03 <0.03    Cardiac Studies: 2D Echo: Study Conclusions  - Left ventricle: The cavity size was normal. Wall thickness was normal. Systolic function was normal. The estimated ejection fraction was in the range of 60% to 65%. Wall motion was normal; there were no regional wall motion abnormalities. Doppler parameters are consistent with abnormal left ventricular relaxation (grade 1 diastolic dysfunction). - Left atrium: The atrium was mildly dilated.  Assessment/Plan:  1. CAD s/p CABG - patent grafts, continue medical management.   2. DOE: NYHA Class 3 symptoms. While 2D echo showed no major abnormalities, I think she may benefit from adding lasix 20 mg daily. Otherwise continue current med Rx.   3. HTN, controlled. Continue current Rx.  Dispo: home today. FU with Dr Joylene Draft and Dr Tamala Julian as outpatient.    Sherren Mocha, M.D. 09/03/2014, 8:18 AM

## 2014-09-03 NOTE — Progress Notes (Signed)
Discharge instructions reviewed with patient and she verbalized understanding of all orders and instructions. All questioned fully answered. She will call me if any problems arise. Alfredo Bach RN BSN 09/03/2014 10:46 AM

## 2014-09-03 NOTE — Discharge Instructions (Signed)
Radial Site Care °Refer to this sheet in the next few weeks. These instructions provide you with information on caring for yourself after your procedure. Your caregiver may also give you more specific instructions. Your treatment has been planned according to current medical practices, but problems sometimes occur. Call your caregiver if you have any problems or questions after your procedure. °HOME CARE INSTRUCTIONS °· You may shower the day after the procedure. Remove the bandage (dressing) and gently wash the site with plain soap and water. Gently pat the site dry. °· Do not apply powder or lotion to the site. °· Do not submerge the affected site in water for 3 to 5 days. °· Inspect the site at least twice daily. °· Do not flex or bend the affected arm for 24 hours. °· No lifting over 5 pounds (2.3 kg) for 5 days after your procedure. °· Do not drive home if you are discharged the same day of the procedure. Have someone else drive you. °· You may drive 24 hours after the procedure unless otherwise instructed by your caregiver. °· Do not operate machinery or power tools for 24 hours. °· A responsible adult should be with you for the first 24 hours after you arrive home. °What to expect: °· Any bruising will usually fade within 1 to 2 weeks. °· Blood that collects in the tissue (hematoma) may be painful to the touch. It should usually decrease in size and tenderness within 1 to 2 weeks. °SEEK IMMEDIATE MEDICAL CARE IF: °· You have unusual pain at the radial site. °· You have redness, warmth, swelling, or pain at the radial site. °· You have drainage (other than a small amount of blood on the dressing). °· You have chills. °· You have a fever or persistent symptoms for more than 72 hours. °· You have a fever and your symptoms suddenly get worse. °· Your arm becomes pale, cool, tingly, or numb. °· You have heavy bleeding from the site. Hold pressure on the site. °Document Released: 04/22/2010 Document Revised:  06/12/2011 Document Reviewed: 04/22/2010 °ExitCare® Patient Information ©2015 [employer]. This information is not intended to replace advice given to you by your health care provider. Make sure you discuss any questions you have with your health care provider. ° °

## 2014-09-03 NOTE — Discharge Summary (Signed)
Patient ID: [name],  MRN: [id number], DOB/AGE: April 10, 1940 74 y.o.  Admit date: 09/01/2014 Discharge date: 09/03/2014  Primary Care Provider: Jerlyn Ly, MD Primary Cardiologist: Dr Tamala Julian  Discharge Diagnoses Principal Problem:   DOE (dyspnea on exertion) Active Problems:   S/P CABG x 3 2013- patent grafts 09/03/14   Essential hypertension   Hyperlipidemia with target LDL less than 70   Hypokalemia   Hematochezia-(Hgb stable)    Procedures:  Coronary angiogram 09/02/14                         Echocardiogram 09/02/14   Hospital Course:  74 y/o female, followed by [name], with a h/o CABG 06/2011( LIMA to LAD, SVG to OM, SVG to RCA), by Dr. Roxan Hockey. EF was normal at that time- 65%. In addition pt has a h/o Addison's disease, HTN, HLD and hypothyroidism. Her most recent OV with [name] was 12/25/13 and was felt stable from a CV standpoint.  She presented to the [facility] ED 09/01/14 with complaints of worsening DOE. Her symptoms had been ongoing for the last 2 months but had gotten worse over the previous 1-2 days. She also had DOE in 2013 prior to her CABG. She also had exertional jaw pain in 2013 but no recurrent jaw pain recently. She notes DOE with daily activities such as walking up the stairs, climbing inclines sweeping etc. No resting or exertional CP. No LEE, leg pain or prolonged travel. No orthopnea or PND. She reports full medication compliance.   CXR showed no acute processes . BNP was mildly elevated at 302. Troponin was negative. EKG showed no acute symptoms. Her BP was moderately elevated in [id number] systolic. SCr was normal at 0.71 and she was noted to be mldly hypokalemia with K at 3.2.    She was admitted with the concern that her DOE was an anginal equivalent.  She was given one dose of IV Lasix and her K+ was replaced. On 09/02/14 she had an episode of hematochezia. She denied any history of hemorrhoids or prior GI bleeding. She has had a remote colonoscopy with  polypectomy but has not seen GI in years. Her Hgb remained stable. Rectal exam revealed light brown stool hemacult negative. On 09/02/14 she underwent coronary and graft angiogram which revealed patent grafts. Echo revealed normal LV and RV function with grade 1 diastolic dysfunction. She was seen by Dr Burt Knack the morning of June 2nd and felt to be stable for discharge. Low dose Imdur was added for possible small vessel CAD. She will follow up with Dr Tamala Julian or an APP in 2 weeks.   Discharge Vitals:  Blood pressure 113/55, pulse 51, temperature 97.9 F (36.6 C), temperature source Oral, resp. rate 18, height 5\' 4"  (1.626 m), weight 202 lb 2.6 oz (91.7 kg), SpO2 94 %.    Labs: Results for orders placed or performed during the hospital encounter of 09/01/14 (from the past 24 hour(s))  Hemoglobin and hematocrit, blood     Status: None   Collection Time: 09/02/14 10:29 AM  Result Value Ref Range   Hemoglobin 12.7 12.0 - 15.0 g/dL   HCT 37.2 36.0 - 46.0 %  Occult blood card to lab, stool Provider will collect     Status: None   Collection Time: 09/02/14 12:15 PM  Result Value Ref Range   Fecal Occult Bld NEGATIVE NEGATIVE    Disposition:  Follow-up Information    Follow up with  Sinclair Grooms, MD.   Specialty:  Cardiology   Why:  office will contact you   Contact information:   [address] [place] [address] [phone]       Call Jerlyn Ly, MD.   Specialty:  Internal Medicine   Why:  call for follow up   Contact information:   [place] [address] [phone]       Discharge Medications:    Medication List    TAKE these medications        acetaminophen 500 MG tablet  Commonly known as:  TYLENOL  Take 500 mg by mouth daily as needed for headache.     amLODipine 5 MG tablet  Commonly known as:  NORVASC  Take 5 mg by mouth daily.     aspirin EC 81 MG tablet  Take 81 mg by mouth every morning.     CYANOCOBALAMIN IJ    Inject as directed every 30 (thirty) days.     FLUoxetine 20 MG capsule  Commonly known as:  PROZAC  Take 20 mg by mouth every morning.     hydrocortisone 10 MG tablet  Commonly known as:  CORTEF  Take 10-20 mg by mouth 2 (two) times daily. 10 mg in the morning and 20 mg in the evening     irbesartan 300 MG tablet  Commonly known as:  AVAPRO  Take 300 mg by mouth daily.     isosorbide mononitrate 30 MG 24 hr tablet  Commonly known as:  IMDUR  Take 0.5 tablets (15 mg total) by mouth daily.     levothyroxine 75 MCG tablet  Commonly known as:  SYNTHROID, LEVOTHROID  Take 75 mcg by mouth daily.     nebivolol 5 MG tablet  Commonly known as:  BYSTOLIC  Take 2.5 mg by mouth at bedtime. Takes 2.5 mg daily     NITROSTAT 0.4 MG SL tablet  Generic drug:  nitroGLYCERIN  ONE TABLET UNDER TONGUE AS NEEDED FOR CHEST PAIN AS DIRECTED     omeprazole 20 MG capsule  Commonly known as:  PRILOSEC  Take 20 mg by mouth every morning.     PROBIOTIC FORMULA PO  Take 1 capsule by mouth every morning. For upset stomach     Vitamin D (Ergocalciferol) 50000 UNITS Caps capsule  Commonly known as:  DRISDOL  Take 50,000 Units by mouth 2 (two) times a week. Takes on Tuesdays and Fridays         Duration of Discharge Encounter: Greater than 30 minutes including physician time.  Angelena Form PA-C 09/03/2014 8:43 AM

## 2014-09-04 ENCOUNTER — Telehealth: Payer: Self-pay | Admitting: Cardiovascular Disease

## 2014-09-04 ENCOUNTER — Other Ambulatory Visit: Payer: Self-pay | Admitting: Interventional Cardiology

## 2014-09-04 ENCOUNTER — Telehealth: Payer: Self-pay | Admitting: Interventional Cardiology

## 2014-09-04 DIAGNOSIS — R0602 Shortness of breath: Secondary | ICD-10-CM

## 2014-09-04 MED ORDER — FUROSEMIDE 20 MG PO TABS: 20.0000 mg | ORAL_TABLET | Freq: Every day | ORAL | Status: DC

## 2014-09-04 NOTE — Telephone Encounter (Signed)
Order placed for pulmonary referral.  

## 2014-09-04 NOTE — Telephone Encounter (Signed)
New message          Pt states that when she saw Burt Knack in the hosp he recommended she go and see her pulmonologist   Pulmonology needs a referral to be placed in [facility] if this is true

## 2014-09-04 NOTE — Telephone Encounter (Signed)
Yes - thx. Referral is for shortness of breath.

## 2014-09-04 NOTE — Telephone Encounter (Signed)
Dr Emelda Fear rounded on this patient this week during her hospitalization. It looks like the pt has contacted [facility] and is scheduled for an appointment on 09/07/14. [facility] is contacting our office because the pt needs a referral.  I reviewed discharge summary and did not see any mention of the pt needing to see Pulmonary. Did you want the pt to see pulmonary for SOB?

## 2014-09-04 NOTE — Telephone Encounter (Signed)
Cath note states pt may benefit from adding lasix 20 mg daily. This was not ordered at discharge.  Will forward to Dr. Burt Knack to see if pt should be on lasix

## 2014-09-04 NOTE — Telephone Encounter (Signed)
Reviewed with Dr. Burt Knack and pt should take Lasix 20 mg daily. Prescription sent to [facility]. Pt notified

## 2014-09-04 NOTE — Telephone Encounter (Signed)
New message      Pt had a cath.  Pt thinks she is to have a fluid pill but nothing was called in.  Please call in fluid pill to [facility]

## 2014-09-07 ENCOUNTER — Ambulatory Visit (INDEPENDENT_AMBULATORY_CARE_PROVIDER_SITE_OTHER): Payer: Medicare Other | Admitting: Pulmonary Disease

## 2014-09-07 ENCOUNTER — Encounter: Payer: Self-pay | Admitting: Pulmonary Disease

## 2014-09-07 VITALS — BP 102/60 | HR 66 | Temp 97.0°F | Ht 64.5 in | Wt 204.8 lb

## 2014-09-07 DIAGNOSIS — Z951 Presence of aortocoronary bypass graft: Secondary | ICD-10-CM

## 2014-09-07 DIAGNOSIS — R0609 Other forms of dyspnea: Secondary | ICD-10-CM

## 2014-09-07 DIAGNOSIS — M150 Primary generalized (osteo)arthritis: Secondary | ICD-10-CM

## 2014-09-07 DIAGNOSIS — M159 Polyosteoarthritis, unspecified: Secondary | ICD-10-CM

## 2014-09-07 DIAGNOSIS — R0600 Dyspnea, unspecified: Secondary | ICD-10-CM

## 2014-09-07 DIAGNOSIS — M5416 Radiculopathy, lumbar region: Secondary | ICD-10-CM

## 2014-09-07 DIAGNOSIS — R918 Other nonspecific abnormal finding of lung field: Secondary | ICD-10-CM

## 2014-09-07 DIAGNOSIS — M8949 Other hypertrophic osteoarthropathy, multiple sites: Secondary | ICD-10-CM

## 2014-09-07 MED ORDER — CLONAZEPAM 0.5 MG PO TABS: 0.2500 mg | ORAL_TABLET | Freq: Two times a day (BID) | ORAL | Status: DC | PRN

## 2014-09-07 NOTE — Patient Instructions (Signed)
Latausha- it was great meeting you today...  Your recent CXR, Spirometry breathing test, and ambulatory oxygen saturation test> All show that your lungs are in pretty good shape...  The previous tiny lung nodules have been followed on serial CT scans and proven benign...  Your shortness of breath w/ activity is certainly multifactorial w/ mult factors contributing including> Your CAD & CABG surg, sedentary lifestyle due to your back pain & severe osteoarthritis, weight gain/ overweight & "chest wall" factors...  As we discussed>  You need to work on weight reduction!!! You need to gradually increase your exercise program (gettin back into cardiac rehab is a great idea) From the pulmonary standpoint- we gave you an INCENTIVE SPIROMETER to use as a "lung exerciser" & concentrate on good deep breaths... We can try a "chest wall muscle relaxer" over the next month or so- KLONOPIN 0.5mg  twice daily (ok to cut it in half if necessary)  Call for any questions...  Let's plan a follow up visit in 4-6weeks, sooner if needed for problems.Marland KitchenMarland Kitchen

## 2014-09-07 NOTE — Progress Notes (Signed)
Subjective:     Patient ID: [name], female   DOB: [birth year], 74 y.o.   MRN: [id number]  HPI      74 y/o WF, referred by DrPerini for a pulmonary evaluation to try & sort out the cause of her dyspnea> [name] saw DrWright in 2010 w 2 tiny left pulm nodules- no change serially & felt to be benign;  She presents on this occas w/ 5mo hx SOB, worse over the last several weeks, notes DOE walking room to room/ on stairs/ etc;  She thinks this is similar to her cardiac presentation previously but repeat cardiac eval recently was neg;  She had CABG in 2013 followed by Cardiac Rehab and did very well but by her own admission she has been sedentary over the last 35yrs due to "major back problems" evaluated by DrWillis & DrDalton-Bethea, on TENS & looking into nerve stim; also had 3 different TKRs and a toltal of 10 joint surgeries...  Her current symptoms include SOB- "I can't talk & walk due to giving out", notes trouble getting the air "IN", can't get a DB, doesn't feel satisfied breathing (a sensation that is relieved transiently by a deep sigh breath); note that review of old notes from 2010 indicates that pt was c/o SOB at rest & w/ activity at that time... Current Meds> no pulmonary meds;  She has hx CAD- s/p 3vessel CABG 2013, DM, HL, etc (note from DrPerrini reviewed)> on Repatha, Imdur, Bystolic, Amlodipine, Avapro, Omep, Levothy, Prozac...      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       EXAM showed Afeb, VSS, O2sat=95% on RA; Wt=205#, 5'4"Tall, BMI=34-35;  HEENT- neg, mallampati2;   Chest- clear w/o w/r/r;  Heart- RR, med stern scar, w/o m/r/g;  Abd- obese, panniculus, soft/neg;  Ext- VI w/o c/c/e...  Last CT Chest 01/29/13 showed stable cardiomeg, mild biapical pleuroparenchymal scarring, all prev noted noncalcif nodules are stable w/o change, no new lesions, no adenopathy, degen changes in Tspine...   CXR 09/01/14 showed stable mild cardiomeg, s/p median sternotomy, lungs clear, DJD Tspine, NAD...   Spirometry 09/07/14 showed FVC=1.95 (70%), FEV1=1.47 (71%), %1sec=76, mid-flows are sl reduced at 66% predicted... This is c/w mild small airways dis & poss mild superimposed restriction...  Ambulatory oxygen saturation test 09/07/14> O2sat=95% on RA w/ pulse=69/min;  She walked 3 laps in the office w/ lowest O2sat=91% w/ pulse=93/min...   LABS 6/16:  Chems- ok x BS=155;  CBC- wnl;    IMP/PLAN>>  [name] is a 74 y/o woman w/ multisys dis- cardiac, ortho, chr back pain;  From the pulmonary standpoint she is a never smoker w/o hx pulmonary dis w/ an essentially clear CXR (s/p CABG), adeq oxygenation, and PFTs w/ poss mild restriction at most;  Her dyspnea is most likely multifactorial in etiology- w/ a type of "chest wall musc spasm" suggested by her history;  In this regard I have suggested a trial of KLONOPIN 0.5mg  bid, and the use of an incentive spirometer as a lung exerciser... All questions were answered and she is requested to f/u  in 4-6wks.    Past Medical History  Diagnosis Date  . Coronary artery disease   . Angina   . Hypertension   . Dysrhythmia   . Shortness of breath   . Recurrent upper respiratory infection (URI)   . Hypothyroidism   . GERD (gastroesophageal reflux disease)   . Anxiety   . Depression   . Addison's disease   . Lumbar radiculopathy 04/27/2014  . 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  last knee surgery dr. Elmyra Ricks gave large dose of hydrocortisal and avoided the hypotensive side effectas of  addison's disease.  [name] PONV (postoperative nausea and vomiting)   . Hyperlipemia   . Arthritis     "everywhere"  . Chronic lower back pain     Past Surgical History  Procedure Laterality Date  . Back surgery    . Tonsillectomy    . Diagnostic laparoscopy    . Dilation and curettage of uterus    . Joint replacement    . Laparoscopic cholecystectomy    . Carpal tunnel release Right   . Knee arthroscopy Bilateral   . Total knee arthroplasty Bilateral   . Revision total knee arthroplasty Right   . Anterior lumbar fusion  1980    L4-5  . Cataract extraction, bilateral Bilateral   . Cardiac catheterization  2013  . Coronary artery bypass graft  06/26/2011    Procedure: CORONARY ARTERY BYPASS GRAFTING (CABG);  Surgeon: Melrose Nakayama, MD;  Location: [facility];  Service: Open Heart Surgery;  Laterality: N/A;  times using Greater Saphenous Vein Graft harvested endoscopically from right leg  . Cardiac catheterization N/A 09/02/2014    Procedure: Left Heart Cath and Coronary Angiography;  Surgeon: Wellington Hampshire, MD;  Location: [facility];  Service: Cardiovascular;  Laterality: N/A;    Outpatient Encounter Prescriptions as of 09/07/2014  Medication Sig  . acetaminophen (TYLENOL) 500 MG tablet Take 500 mg by mouth daily as needed for headache.  [name] amLODipine (NORVASC) 5 MG tablet Take 5 mg by mouth daily.  [name] aspirin EC 81 MG tablet Take 81 mg by mouth every morning.  [name] CYANOCOBALAMIN IJ Inject as directed every 30 (thirty) days.  [name] FLUoxetine (PROZAC) 20 MG capsule Take 20 mg by mouth every morning.  . furosemide (LASIX) 20 MG tablet Take 1 tablet (20 mg total) by mouth daily.  . hydrocortisone (CORTEF) 10 MG tablet Take 10-20 mg by mouth 2 (two) times daily. 20 mg in the morning and 20 mg in the evening  . irbesartan (AVAPRO) 300 MG tablet Take 300 mg by mouth daily.  . isosorbide mononitrate (IMDUR) 30 MG 24 hr tablet Take 0.5 tablets (15 mg total) by mouth daily.  [name] levothyroxine  (SYNTHROID, LEVOTHROID) 75 MCG tablet Take 75 mcg by mouth daily.  . nebivolol (BYSTOLIC) 5 MG tablet Take 2.5 mg by mouth at bedtime. Takes 2.5 mg daily  . NITROSTAT 0.4 MG SL tablet ONE TABLET UNDER TONGUE AS NEEDED FOR CHEST PAIN AS DIRECTED  . omeprazole (PRILOSEC) 20 MG capsule Take 20 mg by mouth every morning.   . Probiotic Product (PROBIOTIC FORMULA PO) Take 1 capsule by mouth every morning. For upset stomach  . Vitamin D, Ergocalciferol, (DRISDOL) 50000 UNITS CAPS Take 50,000 Units by mouth 2 (two) times a week. Takes on Tuesdays and Fridays    Allergies  Allergen Reactions  . Percocet [Oxycodone-Acetaminophen]     Hallucination  . Sulfa  Antibiotics Itching    Vaginal itching    Immunization History  Administered Date(s) Administered  . Influenza Split 01/01/2014  . Pneumococcal-Unspecified 04/03/2012    Family History  Problem Relation Age of Onset  . Heart attack Father     History   Social History  . Marital Status: Widowed    Spouse Name: N/A  . Number of Children: N/A  . Years of Education: N/A   Occupational History  . Retired    Social History Main Topics  . Smoking status: Passive Smoke Exposure - Never Smoker -- 0.00 packs/day for 0 years  . Smokeless tobacco: Never Used  . Alcohol Use: No  . Drug Use: No  . Sexual Activity: No   Other Topics Concern  . Not on file   Social History Narrative    Current Medications, Allergies, Past Medical History, Past Surgical History, Family History, and Social History were reviewed in [employer] record.   Review of Systems            All symptoms NEG except where BOLDED >>  Constitutional:  F/C/S, fatigue, anorexia, unexpected weight change. HEENT:  HA, visual changes, hearing loss, earache, nasal symptoms, sore throat, mouth sores, hoarseness. Resp:  cough, sputum, hemoptysis; SOB, tightness, wheezing. Cardio:  CP, palpit, DOE, orthopnea, edema. GI:  N/V/D/C, blood in stool;  reflux, abd pain, distention, gas. GU:  dysuria, freq, urgency, hematuria, flank pain, voiding difficulty. MS:  joint pain, swelling, tenderness, decr ROM; neck pain, back pain, etc. Neuro:  HA, tremors, seizures, dizziness, syncope, weakness, numbness, gait abn. Skin:  suspicious lesions or skin rash. Heme:  adenopathy, bruising, bleeding. Psyche:  confusion, agitation, sleep disturbance, hallucinations, anxiety, depression suicidal.   Objective:   Physical Exam      Vital Signs:  Reviewed...  General:  WD, overweight, 74 y/o WF in NAD; alert & oriented; pleasant & cooperative... HEENT:  [place]/AT; Conjunctiva- pink, Sclera- nonicteric, EOM-wnl, PERRLA, EACs-clear, TMs-wnl; NOSE-clear; THROAT-clear & wnl. Neck:  Supple w/ full ROM; no JVD; normal carotid impulses w/o bruits; no thyromegaly or nodules palpated; no lymphadenopathy. Chest:  Clear to P & A; without wheezes, rales, or rhonchi heard; median sternotomy scar... Heart:  Regular Rhythm; norm S1 & S2 without murmurs, rubs, or gallops detected. Abdomen:  Obese,soft & nontender- no guarding or rebound; normal bowel sounds; no organomegaly or masses palpated. Ext:  +arthritic changes & TKRs; no varicose veins, +venous insuffic, tr edema;  Pulses intact w/o bruits. Neuro:  No focal neuro deficits; gait normal & balance OK. Derm:  No lesions noted; no rash etc. Lymph:  No cervical, supraclavicular, axillary, or inguinal adenopathy palpated.   Assessment:      IMP>>  Dyspnea -- multifactorial CAD- s/p 3 vessel CABG 2013 Obesity Severe DJD -- s/p 3 TKRs and 10 joint surgeries Chronic LBP -- eval by DrWillis & DrDalton-Bethea Mult medical problems -- DM, HL, others... Sedentary  PLAN>>  Cyril is a 75 y/o woman w/ multisys dis- cardiac, ortho, chr back pain;  From the pulmonary standpoint she is a never smoker w/o hx pulmonary dis w/ an essentially clear CXR (s/p CABG), adeq oxygenation, and PFTs w/ poss mild restriction at most;  Her  dyspnea is most likely multifactorial in etiology- w/ a type of "chest wall musc spasm" suggested by her history;  In this regard I have suggested a trial of KLONOPIN 0.5mg  bid, and the use of an incentive spirometer as a lung exerciser... All questions were answered and  she is requested to f/u in 4-6wks      Plan:     Patient's Medications  New Prescriptions   CLONAZEPAM (KLONOPIN) 0.5 MG TABLET    Take 0.5-1 tablets (0.25-0.5 mg total) by mouth 2 (two) times daily for shortness of breath...  Previous Medications   ACETAMINOPHEN (TYLENOL) 500 MG TABLET    Take 500 mg by mouth daily as needed for headache.   AMLODIPINE (NORVASC) 5 MG TABLET    Take 5 mg by mouth daily.   ASPIRIN EC 81 MG TABLET    Take 81 mg by mouth every morning.   CYANOCOBALAMIN IJ    Inject as directed every 30 (thirty) days.   FLUOXETINE (PROZAC) 20 MG CAPSULE    Take 20 mg by mouth every morning.   FUROSEMIDE (LASIX) 20 MG TABLET    Take 1 tablet (20 mg total) by mouth daily.   HYDROCORTISONE (CORTEF) 10 MG TABLET    Take 10-20 mg by mouth 2 (two) times daily. 20 mg in the morning and 20 mg in the evening   IRBESARTAN (AVAPRO) 300 MG TABLET    Take 300 mg by mouth daily.   ISOSORBIDE MONONITRATE (IMDUR) 30 MG 24 HR TABLET    Take 0.5 tablets (15 mg total) by mouth daily.   LEVOTHYROXINE (SYNTHROID, LEVOTHROID) 75 MCG TABLET    Take 75 mcg by mouth daily.   NEBIVOLOL (BYSTOLIC) 5 MG TABLET    Take 2.5 mg by mouth at bedtime. Takes 2.5 mg daily   NITROSTAT 0.4 MG SL TABLET    ONE TABLET UNDER TONGUE AS NEEDED FOR CHEST PAIN AS DIRECTED   OMEPRAZOLE (PRILOSEC) 20 MG CAPSULE    Take 20 mg by mouth every morning.    PROBIOTIC PRODUCT (PROBIOTIC FORMULA PO)    Take 1 capsule by mouth every morning. For upset stomach   VITAMIN D, ERGOCALCIFEROL, (DRISDOL) 50000 UNITS CAPS    Take 50,000 Units by mouth 2 (two) times a week. Takes on Tuesdays and Fridays  Modified Medications   No medications on file  Discontinued Medications    No medications on file

## 2014-09-30 ENCOUNTER — Encounter: Payer: Self-pay | Admitting: Cardiology

## 2014-09-30 ENCOUNTER — Ambulatory Visit (INDEPENDENT_AMBULATORY_CARE_PROVIDER_SITE_OTHER): Payer: Medicare Other | Admitting: Cardiology

## 2014-09-30 VITALS — BP 102/50 | HR 60 | Ht 64.0 in | Wt 205.0 lb

## 2014-09-30 DIAGNOSIS — E271 Primary adrenocortical insufficiency: Secondary | ICD-10-CM

## 2014-09-30 DIAGNOSIS — G8929 Other chronic pain: Secondary | ICD-10-CM | POA: Insufficient documentation

## 2014-09-30 DIAGNOSIS — R0609 Other forms of dyspnea: Secondary | ICD-10-CM | POA: Diagnosis not present

## 2014-09-30 DIAGNOSIS — M549 Dorsalgia, unspecified: Secondary | ICD-10-CM

## 2014-09-30 DIAGNOSIS — Z951 Presence of aortocoronary bypass graft: Secondary | ICD-10-CM

## 2014-09-30 DIAGNOSIS — I10 Essential (primary) hypertension: Secondary | ICD-10-CM | POA: Diagnosis not present

## 2014-09-30 DIAGNOSIS — R0600 Dyspnea, unspecified: Secondary | ICD-10-CM

## 2014-09-30 DIAGNOSIS — E669 Obesity, unspecified: Secondary | ICD-10-CM

## 2014-09-30 NOTE — Progress Notes (Signed)
09/30/2014 [name]   July 14, 1940  [id number]  Primary Physician Jerlyn Ly, MD Primary Cardiologist: Dr Tamala Julian  HPI:  74 y/o obese female, followed by [name], with a h/o CABG 06/2011( LIMA to LAD, SVG to OM, SVG to RCA), by Dr. Roxan Hockey. EF was normal at that time- 65%. In addition pt has a h/o Addison's disease, on chronic steroids, HTN, HLD, chronic back pain, and hypothyroidism. Her most recent OV with [name] was 12/25/13 and was felt stable from a CV standpoint.  She presented to the [facility] ED 09/01/14 with complaints of worsening DOE. CXR showed no acute processes . BNP was mildly elevated at 302. Troponin was negative. EKG showed no acute symptoms. Her BP was moderately elevated in [id number] systolic. She was admitted with the concern that her DOE was an anginal equivalent. She was given one dose of IV Lasix and her K+ was replaced. On 09/02/14 she had an episode of hematochezia. She denied any history of hemorrhoids or prior GI bleeding. She has had a remote colonoscopy with polypectomy but has not seen GI in years. Her Hgb remained stable. Rectal exam revealed light brown stool hemacult negative. On 09/02/14 she underwent coronary and graft angiogram which revealed patent grafts. Echo revealed normal LV and RV function with grade 1 diastolic dysfunction.   She is seen in the office today for follow up. She says her DOE is better, she attributes it to the dose of Lasix she received. She saw Dr Lenna Gilford who feels her DOE is multifactorial. The pat also has chronic back pain and is limited in what physical activity she can do. She tells me she is awaiting approval from her insurance for an implantable nerve stimulator device and I encouraged her to go through with this if recommended by her back doctor. I suggested she wait til after this is done before starting cardiac rehab.    Current Outpatient Prescriptions  Medication Sig Dispense Refill  . acetaminophen (TYLENOL) 500 MG tablet  Take 500 mg by mouth daily as needed for headache.    [name] amLODipine (NORVASC) 5 MG tablet Take 5 mg by mouth daily.    [name] aspirin EC 81 MG tablet Take 81 mg by mouth every morning.    . clonazePAM (KLONOPIN) 0.5 MG tablet Take 0.5-1 tablets (0.25-0.5 mg total) by mouth 2 (two) times daily as needed for anxiety. 60 tablet 3  . CYANOCOBALAMIN IJ Inject as directed every 30 (thirty) days.    [name] FLUoxetine (PROZAC) 20 MG capsule Take 20 mg by mouth every morning.    . furosemide (LASIX) 20 MG tablet Take 1 tablet (20 mg total) by mouth daily. 30 tablet 3  . hydrocortisone (CORTEF) 10 MG tablet Take 10-20 mg by mouth 2 (two) times daily. 20 mg in the morning and 20 mg in the evening    . hydrocortisone (CORTEF) 20 MG tablet Take 20 mg by mouth 2 (two) times daily.     . hyoscyamine (LEVSIN SL) 0.125 MG SL tablet Take 0.125 mg by mouth every 4 (four) hours as needed for cramping.     . irbesartan (AVAPRO) 300 MG tablet Take 300 mg by mouth daily.    . isosorbide mononitrate (IMDUR) 30 MG 24 hr tablet Take 0.5 tablets (15 mg total) by mouth daily. 30 tablet 5  . levothyroxine (SYNTHROID, LEVOTHROID) 75 MCG tablet Take 75 mcg by mouth daily.    . nebivolol (BYSTOLIC) 5 MG tablet Take 2.5 mg by mouth  at bedtime. Takes 2.5 mg daily    . NITROSTAT 0.4 MG SL tablet ONE TABLET UNDER TONGUE AS NEEDED FOR CHEST PAIN AS DIRECTED 25 tablet 3  . omeprazole (PRILOSEC) 20 MG capsule Take 20 mg by mouth every morning.     . Probiotic Product (PROBIOTIC FORMULA PO) Take 1 capsule by mouth every morning. For upset stomach    . Vitamin D, Ergocalciferol, (DRISDOL) 50000 UNITS CAPS Take 50,000 Units by mouth 2 (two) times a week. Takes on Tuesdays and Fridays     No current facility-administered medications for this visit.    Allergies  Allergen Reactions  . Percocet [Oxycodone-Acetaminophen]     Hallucination  . Sulfa Antibiotics Itching    Vaginal itching    History   Social History  . Marital Status: Widowed     Spouse Name: N/A  . Number of Children: N/A  . Years of Education: N/A   Occupational History  . Retired    Social History Main Topics  . Smoking status: Passive Smoke Exposure - Never Smoker -- 0.00 packs/day for 0 years  . Smokeless tobacco: Never Used  . Alcohol Use: No  . Drug Use: No  . Sexual Activity: No   Other Topics Concern  . Not on file   Social History Narrative     Review of Systems: General: negative for chills, fever, night sweats or weight changes.  Cardiovascular: negative for chest pain, dyspnea on exertion, edema, orthopnea, palpitations, paroxysmal nocturnal dyspnea or shortness of breath Dermatological: negative for rash Respiratory: negative for cough or wheezing Urologic: negative for hematuria Abdominal: negative for nausea, vomiting, diarrhea, bright red blood per rectum, melena, or hematemesis Neurologic: negative for visual changes, syncope, or dizziness All other systems reviewed and are otherwise negative except as noted above.    Blood pressure 102/50, pulse 60, height 5\' 4"  (1.626 m), weight 205 lb (92.987 kg), SpO2 97 %.  General appearance: alert, cooperative and no distress Neck: no carotid bruit and no JVD Lungs: clear to auscultation bilaterally Heart: regular rate and rhythm Extremities: no edema   ASSESSMENT AND PLAN:   S/P CABG x 3 2013- patent grafts 09/03/14 Presenting symptom was DOE  DOE (dyspnea on exertion) Recently seen by Dr Lenna Gilford, felt to be multifactorial  Essential hypertension Controlled, repeat B/P by me 110/60  Addison disease Chronic steroids  Chronic back pain Pt says she qualifies for an implantable nerve stimulator. She is limited in her activity, unable to shop for groceries without a riding cart.  Obesity-BMI 35 Unable to loose any significant wgt on chronic steroids, and unable to exercise secondary to back pain   PLAN  Same cardiac Rx, f/u Dr Tamala Julian in 3 months.   Kerin Ransom K  PA-C 09/30/2014 12:14 PM

## 2014-09-30 NOTE — Assessment & Plan Note (Signed)
Pt says she qualifies for an implantable nerve stimulator. She is limited in her activity, unable to shop for groceries without a riding cart.

## 2014-09-30 NOTE — Assessment & Plan Note (Signed)
Controlled, repeat B/P by me 110/60

## 2014-09-30 NOTE — Assessment & Plan Note (Signed)
Unable to loose any significant wgt on chronic steroids, and unable to exercise secondary to back pain

## 2014-09-30 NOTE — Assessment & Plan Note (Signed)
Chronic steroids 

## 2014-09-30 NOTE — Patient Instructions (Signed)
Medication Instructions:  Your physician recommends that you continue on your current medications as directed. Please refer to the Current Medication list given to you today.  Follow-Up: Follow up in 3 months with [name].  You will receive a letter in the mail 2 months before you are due.  Please call us when you receive this letter to schedule your follow up appointment.  Thank you for choosing [facility]!!

## 2014-09-30 NOTE — Assessment & Plan Note (Signed)
Recently seen by Dr Lenna Gilford, felt to be multifactorial

## 2014-09-30 NOTE — Assessment & Plan Note (Signed)
Presenting symptom was DOE

## 2014-10-01 ENCOUNTER — Encounter: Payer: Self-pay | Admitting: Gastroenterology

## 2014-10-19 ENCOUNTER — Ambulatory Visit (INDEPENDENT_AMBULATORY_CARE_PROVIDER_SITE_OTHER): Payer: Medicare Other | Admitting: Pulmonary Disease

## 2014-10-19 ENCOUNTER — Encounter: Payer: Self-pay | Admitting: Pulmonary Disease

## 2014-10-19 VITALS — BP 102/60 | HR 92 | Temp 97.0°F | Wt 203.0 lb

## 2014-10-19 DIAGNOSIS — I10 Essential (primary) hypertension: Secondary | ICD-10-CM | POA: Diagnosis not present

## 2014-10-19 DIAGNOSIS — G8929 Other chronic pain: Secondary | ICD-10-CM

## 2014-10-19 DIAGNOSIS — Z951 Presence of aortocoronary bypass graft: Secondary | ICD-10-CM

## 2014-10-19 DIAGNOSIS — R0609 Other forms of dyspnea: Secondary | ICD-10-CM | POA: Diagnosis not present

## 2014-10-19 DIAGNOSIS — M549 Dorsalgia, unspecified: Secondary | ICD-10-CM

## 2014-10-19 DIAGNOSIS — R0600 Dyspnea, unspecified: Secondary | ICD-10-CM

## 2014-10-19 DIAGNOSIS — M159 Polyosteoarthritis, unspecified: Secondary | ICD-10-CM

## 2014-10-19 DIAGNOSIS — M8949 Other hypertrophic osteoarthropathy, multiple sites: Secondary | ICD-10-CM

## 2014-10-19 DIAGNOSIS — M5416 Radiculopathy, lumbar region: Secondary | ICD-10-CM

## 2014-10-19 DIAGNOSIS — R918 Other nonspecific abnormal finding of lung field: Secondary | ICD-10-CM | POA: Diagnosis not present

## 2014-10-19 DIAGNOSIS — M150 Primary generalized (osteo)arthritis: Secondary | ICD-10-CM

## 2014-10-19 NOTE — Patient Instructions (Signed)
Today we updated your med list in our EPIC system...    Continue your current medications the same...  Continue the Klonopin 0.5mg  1/2 to 1 tab twice daily as we discussed...  Call for any questions...  Let's plan a follow up visit in 3-63mo, sooner if needed for problems.Marland KitchenMarland Kitchen

## 2014-10-19 NOTE — Progress Notes (Signed)
Subjective:     Patient ID: [name], female   DOB: [birth year], 74 y.o.   MRN: [id number]  HPI ~  September 07, 2014: Initial pulmonary consult w/ SN>        89 y/o WF, referred by DrPerini for a pulmonary evaluation to try & sort out the cause of her dyspnea> [name] saw DrWright in 2010 w 2 tiny left pulm nodules- no change serially & felt to be benign;  She presents on this occas w/ 60mo hx SOB, worse over the last several weeks, notes DOE walking room to room/ on stairs/ etc;  She thinks this is similar to her cardiac presentation previously but repeat cardiac eval recently was neg;  She had CABG in 2013 followed by Cardiac Rehab and did very well but by her own admission she has been sedentary over the last 40yrs due to "major back problems" evaluated by DrWillis & DrDalton-Bethea, on TENS & looking into nerve stim; also had 3 different TKRs and a toltal of 10 joint surgeries...  Her current symptoms include SOB- "I can't talk & walk due to giving out", notes trouble getting the air "IN", can't get a DB, doesn't feel satisfied breathing (a sensation that is relieved transiently by a deep sigh breath); note that review of old notes from 2010 indicates that pt was c/o SOB at rest & w/ activity at that time... Current Meds> no pulmonary meds;  She has hx CAD- s/p 3vessel CABG 2013, DM, HL, etc (note from DrPerrini reviewed)> on Repatha, Imdur, Bystolic, Amlodipine, Avapro, Omep, Levothy, Prozac...      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       EXAM showed Afeb, VSS, O2sat=95% on RA; Wt=205#,  5'4"Tall, BMI=34-35;  HEENT- neg, mallampati2;  Chest- clear w/o w/r/r;  Heart- RR, med stern scar, w/o m/r/g;  Abd- obese, panniculus, soft/neg;  Ext- VI w/o c/c/e...  Last CT Chest 01/29/13 showed stable cardiomeg, mild biapical pleuroparenchymal scarring, all prev noted noncalcif nodules are stable w/o change, no new lesions, no adenopathy, degen changes in Tspine...   CXR 09/01/14 showed stable mild cardiomeg, s/p median sternotomy, lungs clear, DJD Tspine, NAD...   Spirometry 09/07/14 showed FVC=1.95 (70%), FEV1=1.47 (71%), %1sec=76, mid-flows are sl reduced at 66% predicted... This is c/w mild small airways dis & poss mild superimposed restriction...  Ambulatory oxygen saturation test 09/07/14> O2sat=95% on RA w/ pulse=69/min;  She walked 3 laps in the office w/ lowest O2sat=91% w/ pulse=93/min...   LABS 6/16:  Chems- ok x BS=155;  CBC- wnl;    IMP/PLAN>>  [name] is a 74 y/o woman w/ multisys dis- cardiac, ortho, chr back pain;  From the pulmonary standpoint she is a never smoker w/o hx pulmonary dis w/ an essentially clear CXR (s/p CABG), adeq oxygenation, and PFTs w/ poss mild restriction at most;  Her dyspnea is most likely multifactorial in etiology- w/ a type of "chest wall musc spasm" suggested by her history;  In this regard I have suggested a trial of KLONOPIN 0.5mg  bid, and the use of an incentive spirometer as a  lung exerciser... All questions were answered and she is requested to f/u in 4-6wks.  ~  October 19, 2014:  59mo ROV & [name] reports a rapid response to the Klonopin therapy w/ resolution of her dyspnea & she is feeling better overall; she has been able to wean down her dose in the interval- now taking 1/2 tab in the AM and 1 tab Qhs to help her rest (rests better & wakes feeling more energy etc);  She has checked into cardiac rehab for an exercise program & she will pursue this once she gets her nerve stimulator implanted in her back for her severe LBP (per Dr.Dalton-Bethea and NS  DrKilpatrick in [place])...     EXAM reveals Afeb, VSS, O2sat=96% on RA;  HEENT- neg;  Chest- clear;  Heart- RR w/o m/r/g... IMP/PLAN>>  We discussed slowing weaning the Klonopin as tolerated; she will call for any questions or problems; plan ROV in 3-73mo...    Past Medical History  Diagnosis Date  . Coronary artery disease   . Angina   . Hypertension   . Dysrhythmia   . Shortness of breath   . Recurrent upper respiratory infection (URI)   . Hypothyroidism   . GERD (gastroesophageal reflux disease)   . Anxiety   . Depression   . Addison's disease   . Lumbar radiculopathy 04/27/2014  . 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  last knee surgery dr. Elmyra Ricks gave large dose of hydrocortisal and avoided the hypotensive side effectas of addison's disease.  [name] PONV (postoperative nausea and vomiting)   . Hyperlipemia   . Arthritis     "everywhere"  . Chronic lower back pain     Past Surgical History  Procedure Laterality Date  . Back surgery    . Tonsillectomy    . Diagnostic laparoscopy    . Dilation and curettage of uterus    . Joint replacement    . Laparoscopic cholecystectomy    . Carpal tunnel release Right   . Knee arthroscopy Bilateral   . Total knee arthroplasty Bilateral   . Revision total knee arthroplasty Right   . Anterior lumbar fusion  1980    L4-5  . Cataract extraction, bilateral Bilateral   . Cardiac catheterization  2013  . Coronary artery bypass graft  06/26/2011    Procedure: CORONARY ARTERY BYPASS GRAFTING (CABG);  Surgeon: Melrose Nakayama, MD;  Location: [facility];  Service: Open Heart Surgery;  Laterality: N/A;  times using Greater Saphenous Vein Graft harvested endoscopically from right leg  . Cardiac catheterization N/A 09/02/2014    Procedure: Left Heart Cath and Coronary Angiography;  Surgeon: Wellington Hampshire, MD;  Location: [facility];  Service: Cardiovascular;  Laterality: N/A;    Outpatient  Encounter Prescriptions as of 10/19/2014  Medication Sig  . acetaminophen (TYLENOL) 500 MG tablet Take 500 mg by mouth daily as needed for headache.  [name] amLODipine (NORVASC) 5 MG tablet Take 5 mg by mouth daily.  [name] aspirin EC 81 MG tablet Take 81 mg by mouth every morning.  . clonazePAM (KLONOPIN) 0.5 MG tablet Take 0.5-1 tablets (0.25-0.5 mg total) by mouth 2 (two) times daily as needed for anxiety.  . CYANOCOBALAMIN IJ Inject as directed every 30 (thirty) days.  [name] FLUoxetine (PROZAC) 20 MG capsule Take 20 mg by mouth every morning.  . furosemide (LASIX) 20 MG tablet Take 1 tablet (20 mg total) by mouth daily.  . hydrocortisone (  CORTEF) 20 MG tablet Take 20 mg by mouth 2 (two) times daily.   . hyoscyamine (LEVSIN SL) 0.125 MG SL tablet Take 0.125 mg by mouth every 4 (four) hours as needed for cramping.   . irbesartan (AVAPRO) 300 MG tablet Take 300 mg by mouth daily.  . isosorbide mononitrate (IMDUR) 30 MG 24 hr tablet Take 0.5 tablets (15 mg total) by mouth daily.  [name] levothyroxine (SYNTHROID, LEVOTHROID) 75 MCG tablet Take 75 mcg by mouth daily.  . nebivolol (BYSTOLIC) 5 MG tablet Take 2.5 mg by mouth at bedtime. Takes 2.5 mg daily  . NITROSTAT 0.4 MG SL tablet ONE TABLET UNDER TONGUE AS NEEDED FOR CHEST PAIN AS DIRECTED  . omeprazole (PRILOSEC) 20 MG capsule Take 20 mg by mouth every morning.   . Probiotic Product (PROBIOTIC FORMULA PO) Take 1 capsule by mouth every morning. For upset stomach  . Vitamin D, Ergocalciferol, (DRISDOL) 50000 UNITS CAPS Take 50,000 Units by mouth 2 (two) times a week. Takes on Tuesdays and Fridays  . hydrocortisone (CORTEF) 10 MG tablet Take 10-20 mg by mouth 2 (two) times daily. 20 mg in the morning and 20 mg in the evening   No facility-administered encounter medications on file as of 10/19/2014.    Allergies  Allergen Reactions  . Percocet [Oxycodone-Acetaminophen]     Hallucination  . Sulfa Antibiotics Itching    Vaginal itching    Immunization History   Administered Date(s) Administered  . Influenza Split 01/01/2014  . Pneumococcal-Unspecified 04/03/2012    Current Medications, Allergies, Past Medical History, Past Surgical History, Family History, and Social History were reviewed in [employer] record.   Review of Systems            All symptoms NEG except where BOLDED >>  Constitutional:  F/C/S, fatigue, anorexia, unexpected weight change. HEENT:  HA, visual changes, hearing loss, earache, nasal symptoms, sore throat, mouth sores, hoarseness. Resp:  cough, sputum, hemoptysis; SOB, tightness, wheezing. Cardio:  CP, palpit, DOE, orthopnea, edema. GI:  N/V/D/C, blood in stool; reflux, abd pain, distention, gas. GU:  dysuria, freq, urgency, hematuria, flank pain, voiding difficulty. MS:  joint pain, swelling, tenderness, decr ROM; neck pain, back pain, etc. Neuro:  HA, tremors, seizures, dizziness, syncope, weakness, numbness, gait abn. Skin:  suspicious lesions or skin rash. Heme:  adenopathy, bruising, bleeding. Psyche:  confusion, agitation, sleep disturbance, hallucinations, anxiety, depression suicidal.   Objective:   Physical Exam      Vital Signs:  Reviewed...  General:  WD, overweight, 74 y/o WF in NAD; alert & oriented; pleasant & cooperative... HEENT:  [place]/AT; Conjunctiva- pink, Sclera- nonicteric, EOM-wnl, PERRLA, EACs-clear, TMs-wnl; NOSE-clear; THROAT-clear & wnl. Neck:  Supple w/ full ROM; no JVD; normal carotid impulses w/o bruits; no thyromegaly or nodules palpated; no lymphadenopathy. Chest:  Clear to P & A; without wheezes, rales, or rhonchi heard; median sternotomy scar... Heart:  Regular Rhythm; norm S1 & S2 without murmurs, rubs, or gallops detected. Abdomen:  Obese,soft & nontender- no guarding or rebound; normal bowel sounds; no organomegaly or masses palpated. Ext:  +arthritic changes & TKRs; no varicose veins, +venous insuffic, tr edema;  Pulses intact w/o bruits. Neuro:  No focal  neuro deficits; gait normal & balance OK. Derm:  No lesions noted; no rash etc. Lymph:  No cervical, supraclavicular, axillary, or inguinal adenopathy palpated.   Assessment:      IMP>>  Dyspnea -- multifactorial => much improved on Klonopin 0.5mg  bid, wean dose slowly... 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 Sedentary -- needs incr exercise, she will re-start cardiac rehab once she has had her neuro-pain-stim implants.  PLAN>>  We discussed slowing weaning the Klonopin as tolerated; she will call for any questions or problems; plan ROV in 3-66mo     Plan:       Patient's Medications  New Prescriptions   No medications on file  Previous Medications   ACETAMINOPHEN (TYLENOL) 500 MG TABLET    Take 500 mg by mouth daily as needed for headache.   AMLODIPINE (NORVASC) 5 MG TABLET    Take 5 mg by mouth daily.   ASPIRIN EC 81 MG TABLET    Take 81 mg by mouth every morning.   CLONAZEPAM (KLONOPIN) 0.5 MG TABLET    Take 0.5-1 tablets (0.25-0.5 mg total) by mouth 2 (two) times daily as needed for anxiety.   CYANOCOBALAMIN IJ    Inject as directed every 30 (thirty) days.   FLUOXETINE (PROZAC) 20 MG CAPSULE    Take 20 mg by mouth every morning.   FUROSEMIDE (LASIX) 20 MG TABLET    Take 1 tablet (20 mg total) by mouth daily.   HYDROCORTISONE (CORTEF) 10 MG TABLET    Take 10-20 mg by mouth 2 (two) times daily. 20 mg in the morning and 20 mg in the evening   HYDROCORTISONE (CORTEF) 20 MG TABLET    Take 20 mg by mouth 2 (two) times daily.    HYOSCYAMINE (LEVSIN SL) 0.125 MG SL TABLET    Take 0.125 mg by mouth every 4 (four) hours as needed for cramping.    IRBESARTAN (AVAPRO) 300 MG TABLET    Take 300 mg by mouth daily.   ISOSORBIDE MONONITRATE (IMDUR) 30 MG 24 HR TABLET    Take 0.5 tablets (15 mg total) by mouth daily.   LEVOTHYROXINE (SYNTHROID,  LEVOTHROID) 75 MCG TABLET    Take 75 mcg by mouth daily.   NEBIVOLOL (BYSTOLIC) 5 MG TABLET    Take 2.5 mg by mouth at bedtime. Takes 2.5 mg daily   NITROSTAT 0.4 MG SL TABLET    ONE TABLET UNDER TONGUE AS NEEDED FOR CHEST PAIN AS DIRECTED   OMEPRAZOLE (PRILOSEC) 20 MG CAPSULE    Take 20 mg by mouth every morning.    PROBIOTIC PRODUCT (PROBIOTIC FORMULA PO)    Take 1 capsule by mouth every morning. For upset stomach   VITAMIN D, ERGOCALCIFEROL, (DRISDOL) 50000 UNITS CAPS    Take 50,000 Units by mouth 2 (two) times a week. Takes on Tuesdays and Fridays  Modified Medications   No medications on file  Discontinued Medications   No medications on file

## 2014-11-02 ENCOUNTER — Ambulatory Visit (AMBULATORY_SURGERY_CENTER): Payer: Self-pay

## 2014-11-02 VITALS — Ht 64.5 in | Wt 206.2 lb

## 2014-11-02 DIAGNOSIS — Z860100 Personal history of colon polyps, unspecified: Secondary | ICD-10-CM

## 2014-11-02 NOTE — Progress Notes (Signed)
No allergies to eggs or soy No home oxygen No past problems with anesthesia EXCEPT ADDISON'S DZ CAUSES LOW B/P LAST KNEE SURGERY DR Maureen Ralphs GAVE LARGE VOLUMES OF HYDROCORTISOL AND AVOIDED LOW B/P, PONV No diet/weight loss meds  Refused emmi instructions

## 2014-11-09 DIAGNOSIS — M5136 Other intervertebral disc degeneration, lumbar region: Secondary | ICD-10-CM | POA: Insufficient documentation

## 2014-11-09 DIAGNOSIS — G8929 Other chronic pain: Secondary | ICD-10-CM | POA: Insufficient documentation

## 2014-11-09 DIAGNOSIS — M545 Low back pain, unspecified: Secondary | ICD-10-CM | POA: Insufficient documentation

## 2014-11-16 ENCOUNTER — Encounter: Payer: Self-pay | Admitting: Gastroenterology

## 2014-11-16 ENCOUNTER — Ambulatory Visit (AMBULATORY_SURGERY_CENTER): Payer: Medicare Other | Admitting: Gastroenterology

## 2014-11-16 VITALS — BP 133/61 | HR 52 | Temp 98.6°F | Resp 26 | Ht 64.0 in | Wt 206.0 lb

## 2014-11-16 DIAGNOSIS — D123 Benign neoplasm of transverse colon: Secondary | ICD-10-CM | POA: Diagnosis not present

## 2014-11-16 DIAGNOSIS — D125 Benign neoplasm of sigmoid colon: Secondary | ICD-10-CM | POA: Diagnosis not present

## 2014-11-16 DIAGNOSIS — Z86010 Personal history of colonic polyps: Secondary | ICD-10-CM

## 2014-11-16 MED ORDER — SODIUM CHLORIDE 0.9 % IV SOLN: 500.0000 mL | INTRAVENOUS | Status: DC

## 2014-11-16 NOTE — Progress Notes (Signed)
Called to room to assist during endoscopic procedure.  Patient ID and intended procedure confirmed with present staff. Received instructions for my participation in the procedure from the performing physician.  

## 2014-11-16 NOTE — Op Note (Signed)
[facility]  [address]. [place], [address]   COLONOSCOPY PROCEDURE REPORT  PATIENT: [name], [name]  MR#: [id number] BIRTHDATE: Jul 18, 1940 , 61  yrs. old GENDER: female ENDOSCOPIST: Milus Banister, MD PROCEDURE DATE:  11/16/2014 PROCEDURE:   Colonoscopy, surveillance and Colonoscopy with snare polypectomy First Screening Colonoscopy - Avg.  risk and is 50 yrs.  old or older - No.  Prior Negative Screening - Now for repeat screening. N/A  History of Adenoma - Now for follow-up colonoscopy & has been > or = to 3 yrs.  Yes hx of adenoma.  Has been 3 or more years since last colonoscopy.  Polyps removed today? Yes ASA CLASS:   Class II INDICATIONS:Surveillance due to prior colonic neoplasia and Colonoscopy 2008, several small TAs, was recommended to have repeat colonoscopy in 3 years. MEDICATIONS: Monitord anesthesia care and Propofol 250 mg IV DESCRIPTION OF PROCEDURE:   After the risks benefits and alternatives of the procedure were thoroughly explained, informed consent was obtained.  The digital rectal exam revealed no abnormalities of the rectum.   The [id number]  endoscope was introduced through the anus and advanced to the cecum, which was identified by both the appendix and ileocecal valve. No adverse events experienced.   The quality of the prep was excellent.  The instrument was then slowly withdrawn as the colon was fully examined. Estimated blood loss is zero unless otherwise noted in this procedure report.  COLON FINDINGS: Three sessile polyps ranging between 3-71mm in size were found in the transverse colon, sigmoid colon, and at the hepatic flexure.  Polypectomies were performed with a cold snare. The resection was complete, the polyp tissue was completely retrieved and sent to histology.   There was mild diverticulosis noted in the left colon.   The examination was otherwise normal. Retroflexed views revealed no abnormalities. The time  to cecum = 3.9 Withdrawal time = 14.3   The scope was withdrawn and the procedure completed. COMPLICATIONS: There were no immediate complications.  ENDOSCOPIC IMPRESSION: 1.   Three sessile polyps ranging between 3-55mm in size were found in the transverse colon, sigmoid colon, and at the hepatic flexure; polypectomies were performed with a cold snare 2.   Mild diverticulosis was noted in the left colon 3.   The examination was otherwise normal  RECOMMENDATIONS: If the polyp(s) removed today are proven to be adenomatous (pre-cancerous) polyps, you will need a colonoscopy in 3-5 years. Otherwise you should continue to follow colorectal cancer screening guidelines for "routine risk" patients with a colonoscopy in 10 years.  You will receive a letter within 1-2 weeks with the results of your biopsy as well as final recommendations.  Please call my office if you have not received a letter after 3 weeks.  eSigned:  Milus Banister, MD 11/16/2014 1:44 PM   cc: Dr. Joylene Draft

## 2014-11-16 NOTE — Patient Instructions (Signed)
Discharge instructions given. Handouts on polyps and diverticulosis. Resume previous medications. YOU HAD AN ENDOSCOPIC PROCEDURE TODAY AT THE [facility]:   Refer to the procedure report that was given to you for any specific questions about what was found during the examination.  If the procedure report does not answer your questions, please call your gastroenterologist to clarify.  If you requested that your care partner not be given the details of your procedure findings, then the procedure report has been included in a sealed envelope for you to review at your convenience later.  YOU SHOULD EXPECT: Some feelings of bloating in the abdomen. Passage of more gas than usual.  Walking can help get rid of the air that was put into your GI tract during the procedure and reduce the bloating. If you had a lower endoscopy (such as a colonoscopy or flexible sigmoidoscopy) you may notice spotting of blood in your stool or on the toilet paper. If you underwent a bowel prep for your procedure, you may not have a normal bowel movement for a few days.  Please Note:  You might notice some irritation and congestion in your nose or some drainage.  This is from the oxygen used during your procedure.  There is no need for concern and it should clear up in a day or so.  SYMPTOMS TO REPORT IMMEDIATELY:   Following lower endoscopy (colonoscopy or flexible sigmoidoscopy):  Excessive amounts of blood in the stool  Significant tenderness or worsening of abdominal pains  Swelling of the abdomen that is new, acute  Fever of 100F or higher   For urgent or emergent issues, a gastroenterologist can be reached at any hour by calling [phone].   DIET: Your first meal following the procedure should be a small meal and then it is ok to progress to your normal diet. Heavy or fried foods are harder to digest and may make you feel nauseous or bloated.  Likewise, meals heavy in dairy and vegetables can  increase bloating.  Drink plenty of fluids but you should avoid alcoholic beverages for 24 hours.  ACTIVITY:  You should plan to take it easy for the rest of today and you should NOT DRIVE or use heavy machinery until tomorrow (because of the sedation medicines used during the test).    FOLLOW UP: Our staff will call the number listed on your records the next business day following your procedure to check on you and address any questions or concerns that you may have regarding the information given to you following your procedure. If we do not reach you, we will leave a message.  However, if you are feeling well and you are not experiencing any problems, there is no need to return our call.  We will assume that you have returned to your regular daily activities without incident.  If any biopsies were taken you will be contacted by phone or by letter within the next 1-3 weeks.  Please call us at [phone] if you have not heard about the biopsies in 3 weeks.    SIGNATURES/CONFIDENTIALITY: You and/or your care partner have signed paperwork which will be entered into your electronic medical record.  These signatures attest to the fact that that the information above on your After Visit Summary has been reviewed and is understood.  Full responsibility of the confidentiality of this discharge information lies with you and/or your care-partner. 

## 2014-11-16 NOTE — Progress Notes (Signed)
Report to PACU, RN, vss, BBS= Clear.  

## 2014-11-17 ENCOUNTER — Telehealth: Payer: Self-pay | Admitting: *Deleted

## 2014-11-17 NOTE — Telephone Encounter (Signed)
  Follow up Call-  Call back number 11/16/2014  Post procedure Call Back phone  # [phone]  Permission to leave phone message Yes     Patient questions:  Do you have a fever, pain , or abdominal swelling? No. Pain Score  0 *  Have you tolerated food without any problems? Yes.    Have you been able to return to your normal activities? Yes.    Do you have any questions about your discharge instructions: Diet   No. Medications  No. Follow up visit  No.  Do you have questions or concerns about your Care? No.  Actions: * If pain score is 4 or above: No action needed, pain <4.

## 2014-11-23 ENCOUNTER — Encounter: Payer: Self-pay | Admitting: Gastroenterology

## 2014-12-22 DIAGNOSIS — Z09 Encounter for follow-up examination after completed treatment for conditions other than malignant neoplasm: Secondary | ICD-10-CM | POA: Insufficient documentation

## 2015-01-01 ENCOUNTER — Other Ambulatory Visit: Payer: Self-pay

## 2015-01-01 ENCOUNTER — Ambulatory Visit (INDEPENDENT_AMBULATORY_CARE_PROVIDER_SITE_OTHER): Payer: Medicare Other | Admitting: Interventional Cardiology

## 2015-01-01 ENCOUNTER — Encounter: Payer: Self-pay | Admitting: Interventional Cardiology

## 2015-01-01 VITALS — HR 73 | Ht 64.5 in | Wt 203.8 lb

## 2015-01-01 DIAGNOSIS — G8929 Other chronic pain: Secondary | ICD-10-CM

## 2015-01-01 DIAGNOSIS — E785 Hyperlipidemia, unspecified: Secondary | ICD-10-CM | POA: Diagnosis not present

## 2015-01-01 DIAGNOSIS — Z951 Presence of aortocoronary bypass graft: Secondary | ICD-10-CM

## 2015-01-01 DIAGNOSIS — I10 Essential (primary) hypertension: Secondary | ICD-10-CM | POA: Diagnosis not present

## 2015-01-01 DIAGNOSIS — M549 Dorsalgia, unspecified: Secondary | ICD-10-CM

## 2015-01-01 DIAGNOSIS — R0602 Shortness of breath: Secondary | ICD-10-CM

## 2015-01-01 NOTE — Patient Instructions (Signed)
Medication Instructions:  Your physician has recommended you make the following change in your medication:  STOP Isosorbide    Labwork: None ordered  Testing/Procedures: None ordered  Follow-Up: Your physician wants you to follow-up in: 6 months with Dr.Smith You will receive a reminder letter in the mail two months in advance. If you don't receive a letter, please call our office to schedule the follow-up appointment.   Any Other Special Instructions Will Be Listed Below (If Applicable).

## 2015-01-01 NOTE — Progress Notes (Signed)
Cardiology Office Note   Date:  01/01/2015   ID:  Abigail, Scott Feb 17, 1941, MRN [id number]  PCP:  Jerlyn Ly, MD  Cardiologist:  Sinclair Grooms, MD   Chief Complaint  Patient presents with  . Coronary Artery Disease  . Shortness of Breath      History of Present Illness: [name] is a 74 y.o. female who presents for CAD, restrictive lung disease, previous coronary bypass grafting, hypertension, anxiety disorder, chronic lumbar disc disease, and dysrhythmia.  The patient had bypass surgery in March 2013. She was admitted to the hospital in June 2016 with dyspnea and was felt to have a component of diastolic heart failure. Coronary angiography was performed and did not demonstrate any evidence of bypass graft occlusion. She was sent home on a medical regimen of long-acting nitrates and loop diuretic therapy. She has had no recurrence of chest discomfort and she is decrease the diuretic regimen to 20 mg of furosemide daily. She denies orthopnea. She has occasional palpitations when lying. She has not had syncope. There is no peripheral edema.    Past Medical History  Diagnosis Date  . Coronary artery disease   . Angina   . Hypertension   . Dysrhythmia   . Shortness of breath   . Recurrent upper respiratory infection (URI)   . Hypothyroidism   . GERD (gastroesophageal reflux disease)   . Anxiety   . Depression   . Addison's disease   . Lumbar radiculopathy 04/27/2014  . 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  last knee surgery dr. Elmyra Ricks gave large dose of hydrocortisal and avoided the hypotensive side effectas of addison's disease.  [name] PONV (postoperative nausea and vomiting)   . Hyperlipemia   . Arthritis     "everywhere"  . Chronic lower back pain     Past Surgical History  Procedure Laterality Date  . Back surgery    . Tonsillectomy    . Diagnostic laparoscopy    . Dilation and curettage of  uterus    . Joint replacement    . Laparoscopic cholecystectomy    . Carpal tunnel release Right   . Knee arthroscopy Bilateral   . Total knee arthroplasty Bilateral   . Revision total knee arthroplasty Right   . Anterior lumbar fusion  1980    L4-5  . Cataract extraction, bilateral Bilateral   . Cardiac catheterization  2013  . Coronary artery bypass graft  06/26/2011    Procedure: CORONARY ARTERY BYPASS GRAFTING (CABG);  Surgeon: Melrose Nakayama, MD;  Location: [facility];  Service: Open Heart Surgery;  Laterality: N/A;  times using Greater Saphenous Vein Graft harvested endoscopically from right leg  . Cardiac catheterization N/A 09/02/2014    Procedure: Left Heart Cath and Coronary Angiography;  Surgeon: Wellington Hampshire, MD;  Location: [facility];  Service: Cardiovascular;  Laterality: N/A;  . Achilles tendon surgery  07/16/14    left ankle, bone spur removed     Current Outpatient Prescriptions  Medication Sig Dispense Refill  . acetaminophen (TYLENOL) 500 MG tablet Take 500 mg by mouth daily as needed for headache.    [name] amLODipine (NORVASC) 5 MG tablet Take 2.5 mg by mouth daily.     [name] aspirin EC 81 MG tablet Take 81 mg by mouth every morning.    . clonazePAM (KLONOPIN) 0.5 MG tablet Take 0.5-1 tablets (0.25-0.5 mg total) by mouth 2 (two)  times daily as needed for anxiety. 60 tablet 3  . CYANOCOBALAMIN IJ Inject as directed every 30 (thirty) days.    [name] FLUoxetine (PROZAC) 20 MG capsule Take 20 mg by mouth every morning.    . furosemide (LASIX) 20 MG tablet Take 1 tablet (20 mg total) by mouth daily. 30 tablet 3  . hydrocortisone (CORTEF) 20 MG tablet Take 10 mg by mouth in the AM and take 20 mg by mouth in the PM    . irbesartan (AVAPRO) 300 MG tablet Take 300 mg by mouth daily.    [name] levothyroxine (SYNTHROID, LEVOTHROID) 75 MCG tablet Take 75 mcg by mouth daily.    . nebivolol (BYSTOLIC) 5 MG tablet Take 2.5 mg by mouth at bedtime. Takes 2.5 mg daily    . NITROSTAT 0.4 MG  SL tablet ONE TABLET UNDER TONGUE AS NEEDED FOR CHEST PAIN AS DIRECTED 25 tablet 3  . omeprazole (PRILOSEC) 20 MG capsule Take 20 mg by mouth every morning.     . Probiotic Product (PROBIOTIC FORMULA PO) Take 1 capsule by mouth every morning. For upset stomach    . Vitamin D, Ergocalciferol, (DRISDOL) 50000 UNITS CAPS Take 50,000 Units by mouth 2 (two) times a week. Takes on Tuesdays and Fridays     No current facility-administered medications for this visit.    Allergies:   Percocet; Tizanidine; Sulfa antibiotics; and Sulfacetamide sodium    Social History:  The patient  reports that she has been passively smoking.  She has never used smokeless tobacco. She reports that she does not drink alcohol or use illicit drugs.   Family History:  The patient's family history includes Heart attack in her father; Hypertension in her maternal grandfather and mother; Stroke in her mother. There is no history of Colon cancer.    ROS:  Please see the history of present illness.   Otherwise, review of systems are positive for severe lower back discomfort, decreased memory since surgery, occasional leg swelling, anxiety, and snoring..   All other systems are reviewed and negative.    PHYSICAL EXAM: VS:  Pulse 73  Ht 5' 4.5" (1.638 m)  Wt 92.443 kg (203 lb 12.8 oz)  BMI 34.45 kg/m2  SpO2 93% , BMI Body mass index is 34.45 kg/(m^2). GEN: Well nourished, well developed, in no acute distress HEENT: normal Neck: no JVD, carotid bruits, or masses Cardiac: RRR.  There is no murmur, rub, or gallop. There is no edema. Respiratory:  clear to auscultation bilaterally, normal work of breathing. GI: soft, nontender, nondistended, + BS MS: no deformity or atrophy Skin: warm and dry, no rash Neuro:  Strength and sensation are intact Psych: euthymic mood, full affect   EKG:  EKG is not ordered today.    Recent Labs: 09/01/2014: ALT 18; B Natriuretic Peptide 302.6*; Platelets 270 09/02/2014: BUN 12;  Creatinine, Ser 0.82; Hemoglobin 12.7; Potassium 3.7; Sodium 139    Lipid Panel    Component Value Date/Time   CHOL 100 09/02/2014 0346   TRIG 78 09/02/2014 0346   HDL 40* 09/02/2014 0346   CHOLHDL 2.5 09/02/2014 0346   VLDL 16 09/02/2014 0346   LDLCALC 44 09/02/2014 0346      Wt Readings from Last 3 Encounters:  01/01/15 92.443 kg (203 lb 12.8 oz)  11/16/14 93.441 kg (206 lb)  11/02/14 93.532 kg (206 lb 3.2 oz)      Other studies Reviewed: Additional studies/ records that were reviewed today include: Reviewed the discharge summary, laboratory data, from  the June 2016 hospital admission. Reviewed recent pulmonary notes and evaluation by Dr. Arta Silence.. The findings include the opinion that dyspnea is multifactorial. This is per Dr Nicki Reaper noted elsewhere..    ASSESSMENT AND PLAN:  1. S/P CABG x 3 2013- patent grafts 09/03/14 Denies angina  2. Essential hypertension Controlled  3. Hyperlipidemia On therapy  4. SOB (shortness of breath) Multifactorial and improved more by Klonopin than anything else. Per Dr. Jeannine Kitten  5. Chronic back pain Continues to be her major issue preventing physical activity    Current medicines are reviewed at length with the patient today.  The patient has the following concerns regarding medicines: None.  The following changes/actions have been instituted:    Discontinue isosorbide as it is not adding any value to her current medical regimen. Her grafts were patent.  Continue with the current low-dose diuretic therapy to prevent any component of volume overload.  Labs/ tests ordered today include:  No orders of the defined types were placed in this encounter.     Disposition:   FU with HS in 6 months  Signed, Sinclair Grooms, MD  01/01/2015 5:30 PM    [address], [place], [place]  [address] Phone: [phone]; Fax: [phone]

## 2015-02-16 ENCOUNTER — Other Ambulatory Visit: Payer: Self-pay | Admitting: Cardiovascular Disease

## 2015-02-17 ENCOUNTER — Encounter: Payer: Self-pay | Admitting: Pulmonary Disease

## 2015-02-17 ENCOUNTER — Ambulatory Visit (INDEPENDENT_AMBULATORY_CARE_PROVIDER_SITE_OTHER): Payer: Medicare Other | Admitting: Pulmonary Disease

## 2015-02-17 VITALS — BP 120/74 | HR 60 | Temp 97.7°F | Wt 203.0 lb

## 2015-02-17 DIAGNOSIS — G8929 Other chronic pain: Secondary | ICD-10-CM

## 2015-02-17 DIAGNOSIS — M549 Dorsalgia, unspecified: Secondary | ICD-10-CM

## 2015-02-17 DIAGNOSIS — R918 Other nonspecific abnormal finding of lung field: Secondary | ICD-10-CM | POA: Diagnosis not present

## 2015-02-17 DIAGNOSIS — R0683 Snoring: Secondary | ICD-10-CM | POA: Diagnosis not present

## 2015-02-17 DIAGNOSIS — Z951 Presence of aortocoronary bypass graft: Secondary | ICD-10-CM | POA: Diagnosis not present

## 2015-02-17 DIAGNOSIS — M159 Polyosteoarthritis, unspecified: Secondary | ICD-10-CM

## 2015-02-17 DIAGNOSIS — M8949 Other hypertrophic osteoarthropathy, multiple sites: Secondary | ICD-10-CM

## 2015-02-17 DIAGNOSIS — R0600 Dyspnea, unspecified: Secondary | ICD-10-CM

## 2015-02-17 DIAGNOSIS — R0609 Other forms of dyspnea: Secondary | ICD-10-CM

## 2015-02-17 DIAGNOSIS — E669 Obesity, unspecified: Secondary | ICD-10-CM

## 2015-02-17 DIAGNOSIS — M150 Primary generalized (osteo)arthritis: Secondary | ICD-10-CM

## 2015-02-17 NOTE — Patient Instructions (Signed)
Today we updated your med list in our EPIC system...    Continue your current medications the same...  We discussed your snoring & several options for further eval...    Please let me know if your want to proceed w/ further eval...  Call for any questions...  Let's plan a follow up visit in 70mo, sooner if needed for problems.Marland KitchenMarland Kitchen

## 2015-02-17 NOTE — Progress Notes (Signed)
Subjective:     Patient ID: [name], female   DOB: 05-09-1940, 74 y.o.   MRN: [id number]  HPI ~  September 07, 2014: Initial pulmonary consult w/ SN>        89 y/o WF, referred by DrPerini for a pulmonary evaluation to try & sort out the cause of her dyspnea> [name] saw DrWright in 2010 w 2 tiny left pulm nodules- no change serially & felt to be benign;  She presents on this occas w/ 62mo hx SOB, worse over the last several weeks, notes DOE walking room to room/ on stairs/ etc;  She thinks this is similar to her cardiac presentation previously but repeat cardiac eval recently was neg;  She had CABG in 2013 followed by Cardiac Rehab and did very well but by her own admission she has been sedentary over the last 75yrs due to "major back problems" evaluated by DrWillis & DrDalton-Bethea, on TENS & looking into nerve stim; also had 3 different TKRs and a toltal of 10 joint surgeries...  Her current symptoms include SOB- "I can't talk & walk due to giving out", notes trouble getting the air "IN", can't get a DB, doesn't feel satisfied breathing (a sensation that is relieved transiently by a deep sigh breath); note that review of old notes from 2010 indicates that pt was c/o SOB at rest & w/ activity at that time... Current Meds> no pulmonary meds;  She has hx CAD- s/p 3vessel CABG 2013, DM, HL, etc (note from DrPerrini reviewed)> on Repatha, Imdur, Bystolic, Amlodipine, Avapro, Omep, Levothy, Prozac...      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      EXAM showed Afeb, VSS, O2sat=95% on RA; Wt=205#,  5'4"Tall, BMI=34-35;  HEENT- neg, mallampati2;  Chest- clear w/o w/r/r;  Heart- RR, med stern scar, w/o m/r/g;  Abd- obese, panniculus, soft/neg;  Ext- VI w/o c/c/e...  Last CT Chest 01/29/13 showed stable cardiomeg, mild biapical pleuroparenchymal scarring, all prev noted noncalcif nodules are stable w/o change, no new lesions, no adenopathy, degen changes in Tspine...   CXR 09/01/14 showed stable mild cardiomeg, s/p median sternotomy, lungs clear, DJD Tspine, NAD...   Spirometry 09/07/14 showed FVC=1.95 (70%), FEV1=1.47 (71%), %1sec=76, mid-flows are sl reduced at 66% predicted... This is c/w mild small airways dis & poss mild superimposed restriction...  Ambulatory oxygen saturation test 09/07/14> O2sat=95% on RA w/ pulse=69/min;  She walked 3 laps in the office w/ lowest O2sat=91% w/ pulse=93/min...   LABS 6/16:  Chems- ok x BS=155;  CBC- wnl;   IMP/PLAN>>  [name] is a 74 y/o woman w/ multisys dis- cardiac, ortho, chr back pain;  From the pulmonary standpoint she is a never smoker w/o hx pulmonary dis w/ an essentially clear CXR (s/p CABG), adeq oxygenation, and PFTs w/ poss mild restriction at most;  Her dyspnea is most likely multifactorial in etiology- w/ a type of "chest wall musc spasm" suggested by her history;  In this regard I have suggested a trial of KLONOPIN 0.5mg  bid, and the use of an incentive spirometer as a lung exerciser.Marland KitchenMarland Kitchen  All questions were answered and she is requested to f/u in 4-6wks.   ~  October 19, 2014:  32mo ROV w/ SN>  Deta reports a rapid response to the Klonopin therapy w/ resolution of her dyspnea & she is feeling better overall; she has been able to wean down her dose in the interval- now taking 1/2 tab in the AM and 1 tab Qhs to help her rest (rests better & wakes feeling more energy etc);  She has checked into cardiac rehab for an exercise program & she will pursue this once she gets her nerve stimulator implanted in her back for her severe LBP (per Dr.Dalton-Bethea and [place]  DrKilpatrick in [place])...     EXAM reveals Afeb, VSS, O2sat=96% on RA;  HEENT- neg;  Chest- clear;  Heart- RR w/o m/r/g... IMP/PLAN>>  We discussed slowing weaning the Klonopin as tolerated; she will call for any questions or problems; plan ROV in 3-23mo...   ~  February 17, 2015:  68mo ROV w/ SN>  [name] continues to do well on the Klonopin- now down to 1/2 tab Qhs and occas 1/2 tab in AM if needed;  She notes sl SOB w/ exertion but denies cough, sput, hemoptysis, CP, etc;  She tells me that she had back surg 12/2014 by DrKilpatrick ([place]) in [place] w/ a neurostimulator implant, this device is controlled by remote & appears to be very successful, really helping her chr back pain;  She had a cardiac f/u w/ DrHSmith 09/2014- HBP, CAD, s/p CABG 0000000, diastolic CHF, dysrhythmia; cath 09/2014 showed patent grafts & she continues to do well; she tells me that she is anxious to start back into cardiac rehab after 1st of the yr...     [name] mentioned prob w/ snoring (& dry mouth in the AM) to me today- she denies OSA & knows a good bit about this since her husb was on CPAP for yrs (he passed away 8 yrs ago); her CC is snoring (knows this is a prob x yrs & recently underscored when on a trip w/ her girlfriends & her snoring was an issue for the group); she actually got good relief w/ breathe-right nasal strips but she is upset because the new strips will NOT stick to her nose;  We discussed several options to consider> ask Derm if the have a good adhesive, consider dental appliance OTC or from oral surg, consider home sleep study to check for OSA & perhaps even going w/ CPAP for the snoring (ie- pvt pay if there is no OSA on the study)...     EXAM reveals Afeb, VSS, O2sat=96% on RA;  HEENT- neg;  Chest- clear;  Heart- RR w/o m/r/g; Ext- VI w/o c/c/e... IMP/PLAN>>  [name] continues to do well on the Klonopin, no inhalers needed; meds were adjusted by DrSmith (off Isosorbide), she is looking forward to starting cardiac rehab again...  We discussed ROV in 62mo.    Past Medical History  Diagnosis Date  . Coronary artery disease   . Angina   . Hypertension   . Dysrhythmia   . Shortness of breath   . Recurrent upper respiratory infection (URI)   . Hypothyroidism   . GERD (gastroesophageal reflux disease)   . Anxiety   . Depression   . Addison's disease ([place])   . Lumbar radiculopathy 04/27/2014  . 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  last knee surgery dr. Elmyra Ricks gave large dose of hydrocortisal  and avoided the hypotensive side effectas of addison's disease.  [name] PONV (postoperative nausea and vomiting)   . Hyperlipemia   . Arthritis     "everywhere"  . Chronic lower back pain     Past Surgical History  Procedure Laterality Date  . Back surgery    . Tonsillectomy    . Diagnostic laparoscopy    . Dilation and curettage of uterus    . Joint replacement    . Laparoscopic cholecystectomy    . Carpal tunnel release Right   . Knee arthroscopy Bilateral   . Total knee arthroplasty Bilateral   . Revision total knee arthroplasty Right   . Anterior lumbar fusion  1980    L4-5  . Cataract extraction, bilateral Bilateral   . Cardiac catheterization  2013  . Coronary artery bypass graft  06/26/2011    Procedure: CORONARY ARTERY BYPASS GRAFTING (CABG);  Surgeon: Melrose Nakayama, MD;  Location: [facility];  Service: Open Heart Surgery;  Laterality: N/A;  times using Greater Saphenous Vein Graft harvested endoscopically from right leg  . Cardiac catheterization N/A 09/02/2014    Procedure: Left Heart Cath and Coronary Angiography;  Surgeon: Wellington Hampshire, MD;  Location: [facility];  Service: Cardiovascular;  Laterality: N/A;  . Achilles tendon surgery  07/16/14    left ankle, bone spur removed    Outpatient Encounter Prescriptions as of 02/17/2015  Medication Sig  . acetaminophen (TYLENOL) 500 MG tablet Take 500 mg by mouth daily as needed for headache.  [name]  amLODipine (NORVASC) 5 MG tablet Take 2.5 mg by mouth daily.   [name] aspirin EC 81 MG tablet Take 81 mg by mouth every morning.  . clonazePAM (KLONOPIN) 0.5 MG tablet Take 0.5-1 tablets (0.25-0.5 mg total) by mouth 2 (two) times daily as needed for anxiety.  . CYANOCOBALAMIN IJ Inject as directed every 30 (thirty) days.  [name] FLUoxetine (PROZAC) 20 MG capsule Take 20 mg by mouth every morning.  . hydrocortisone (CORTEF) 20 MG tablet Take 10 mg by mouth in the AM and take 20 mg by mouth in the PM  . irbesartan (AVAPRO) 300 MG tablet Take 300 mg by mouth daily.  [name] levothyroxine (SYNTHROID, LEVOTHROID) 75 MCG tablet Take 75 mcg by mouth daily.  . nebivolol (BYSTOLIC) 5 MG tablet Take 2.5 mg by mouth at bedtime. Takes 2.5 mg daily  . NITROSTAT 0.4 MG SL tablet ONE TABLET UNDER TONGUE AS NEEDED FOR CHEST PAIN AS DIRECTED  . omeprazole (PRILOSEC) 20 MG capsule Take 20 mg by mouth every morning.   . Probiotic Product (PROBIOTIC FORMULA PO) Take 1 capsule by mouth every morning. For upset stomach  . Vitamin D, Ergocalciferol, (DRISDOL) 50000 UNITS CAPS Take 50,000 Units by mouth 2 (two) times a week. Takes on Tuesdays and Fridays  .  furosemide (LASIX) 20 MG tablet Take 1 tablet (20 mg total) by mouth daily.    Allergies  Allergen Reactions  . Percocet [Oxycodone-Acetaminophen]     Hallucination  . Tizanidine     unknown  . Sulfa Antibiotics Itching    Vaginal itching  . Sulfacetamide Sodium Itching    Vaginal itching    Immunization History  Administered Date(s) Administered  . Influenza Split 01/01/2014  . Influenza-Unspecified 01/17/2015  . Pneumococcal-Unspecified 04/03/2012    Current Medications, Allergies, Past Medical History, Past Surgical History, Family History, and Social History were reviewed in [employer] record.   Review of Systems  All symptoms NEG except where BOLDED >>  Constitutional:  F/C/S, fatigue, anorexia, unexpected weight  change. HEENT:  HA, visual changes, hearing loss, earache, nasal symptoms, sore throat, mouth sores, hoarseness. Resp:  cough, sputum, hemoptysis; SOB, tightness, wheezing. Cardio:  CP, palpit, DOE, orthopnea, edema. GI:  N/V/D/C, blood in stool; reflux, abd pain, distention, gas. GU:  dysuria, freq, urgency, hematuria, flank pain, voiding difficulty. MS:  joint pain, swelling, tenderness, decr ROM; neck pain, back pain, etc. Neuro:  HA, tremors, seizures, dizziness, syncope, weakness, numbness, gait abn. Skin:  suspicious lesions or skin rash. Heme:  adenopathy, bruising, bleeding. Psyche:  confusion, agitation, sleep disturbance, hallucinations, anxiety, depression suicidal.   Objective:   Physical Exam      Vital Signs:  Reviewed...  General:  WD, overweight, 74 y/o WF in NAD; alert & oriented; pleasant & cooperative... HEENT:  [place]/AT; Conjunctiva- pink, Sclera- nonicteric, EOM-wnl, PERRLA, EACs-clear, TMs-wnl; NOSE-clear; THROAT-clear & wnl. Neck:  Supple w/ fair ROM; no JVD; normal carotid impulses w/o bruits; no thyromegaly or nodules palpated; no lymphadenopathy. Chest:  Clear to P & A; without wheezes, rales, or rhonchi heard; median sternotomy scar... Heart:  Regular Rhythm; norm S1 & S2 without murmurs, rubs, or gallops detected. Abdomen:  Obese,soft & nontender- no guarding or rebound; normal bowel sounds; no organomegaly or masses palpated. Ext:  +arthritic changes & TKRs; no varicose veins, +venous insuffic, tr edema;  Pulses intact w/o bruits. Neuro:  No focal neuro deficits; gait normal & balance OK. Derm:  No lesions noted; no rash etc. Lymph:  No cervical, supraclavicular, axillary, or inguinal adenopathy palpated.   Assessment:      IMP>>  Dyspnea -- multifactorial => much improved on Klonopin 0.5mg  & dose weaned slowly... HBP, CAD- s/p 3 vessel CABG 0000000, DiastolicCHF> followed by DrHSmith... Obesity> we reviewed diet, exercise, wt reduction strategies... 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 Sedentary -- needs incr exercise, she will re-start cardiac rehab in Jan2017...  PLAN>>  7/18>  We discussed slowing weaning the Klonopin as tolerated; she will call for any questions or problems; plan ROV in 3-103mo 11/16>  [name] continues to do well on the Klonopin, no inhalers needed; meds were adjusted by DrSmith (off Isosorbide), she is looking forward to starting cardiac rehab again... We discussed ROV in 79mo     Plan:       Patient's Medications  New Prescriptions   No medications on file  Previous Medications   ACETAMINOPHEN (TYLENOL) 500 MG TABLET    Take 500 mg by mouth daily as needed for headache.   AMLODIPINE (NORVASC) 5 MG TABLET    Take 2.5 mg by mouth daily.    ASPIRIN EC 81 MG TABLET    Take 81 mg by mouth every morning.   CLONAZEPAM (KLONOPIN) 0.5 MG TABLET    Take 0.5-1 tablets (0.25-0.5 mg total) by mouth 2 (two) times daily as needed for anxiety.   CYANOCOBALAMIN IJ    Inject as directed every 30 (thirty) days.   FLUOXETINE (PROZAC) 20 MG CAPSULE    Take 20 mg by mouth every morning.   HYDROCORTISONE (CORTEF) 20 MG TABLET    Take 10 mg by mouth in the AM and take 20 mg by mouth in the PM   IRBESARTAN (AVAPRO) 300 MG TABLET    Take 300 mg by mouth daily.  LEVOTHYROXINE (SYNTHROID, LEVOTHROID) 75 MCG TABLET    Take 75 mcg by mouth daily.   NEBIVOLOL (BYSTOLIC) 5 MG TABLET    Take 2.5 mg by mouth at bedtime. Takes 2.5 mg daily   NITROSTAT 0.4 MG SL TABLET    ONE TABLET UNDER TONGUE AS NEEDED FOR CHEST PAIN AS DIRECTED   OMEPRAZOLE (PRILOSEC) 20 MG CAPSULE    Take 20 mg by mouth every morning.    PROBIOTIC PRODUCT (PROBIOTIC FORMULA PO)    Take 1 capsule by mouth every morning. For upset stomach   VITAMIN D, ERGOCALCIFEROL, (DRISDOL) 50000 UNITS CAPS    Take 50,000 Units by mouth 2 (two) times a  week. Takes on Tuesdays and Fridays  Modified Medications   Modified Medication Previous Medication   FUROSEMIDE (LASIX) 20 MG TABLET furosemide (LASIX) 20 MG tablet      Take 1 tablet (20 mg total) by mouth daily.    Take 1 tablet (20 mg total) by mouth daily.  Discontinued Medications   No medications on file

## 2015-02-19 ENCOUNTER — Ambulatory Visit: Payer: Medicare Other | Admitting: Pulmonary Disease

## 2015-04-12 ENCOUNTER — Other Ambulatory Visit: Payer: Self-pay | Admitting: Pulmonary Disease

## 2015-04-12 NOTE — Telephone Encounter (Signed)
Refill request received for Clonazepam. Please advise. Thank you.

## 2015-05-05 ENCOUNTER — Ambulatory Visit
Admission: RE | Admit: 2015-05-05 | Discharge: 2015-05-05 | Disposition: A | Discharge status: Home / Self Care | Payer: Medicare Other | Source: Ambulatory Visit | Attending: Internal Medicine | Admitting: Internal Medicine

## 2015-05-05 ENCOUNTER — Other Ambulatory Visit: Payer: Self-pay | Admitting: Internal Medicine

## 2015-05-05 DIAGNOSIS — R079 Chest pain, unspecified: Secondary | ICD-10-CM

## 2015-05-05 MED ORDER — IOPAMIDOL (ISOVUE-370) INJECTION 76%
100.0000 mL | Freq: Once | INTRAVENOUS | Status: AC | PRN
Start: 2015-05-05 — End: 2015-05-05
  Administered 2015-05-05: 100 mL via INTRAVENOUS

## 2015-05-25 ENCOUNTER — Other Ambulatory Visit: Payer: Self-pay | Admitting: Physical Medicine and Rehabilitation

## 2015-05-25 DIAGNOSIS — M546 Pain in thoracic spine: Secondary | ICD-10-CM

## 2015-05-25 DIAGNOSIS — M5134 Other intervertebral disc degeneration, thoracic region: Secondary | ICD-10-CM

## 2015-06-03 ENCOUNTER — Ambulatory Visit
Admission: RE | Admit: 2015-06-03 | Discharge: 2015-06-03 | Disposition: A | Discharge status: Home / Self Care | Payer: Medicare Other | Source: Ambulatory Visit | Attending: Physical Medicine and Rehabilitation | Admitting: Physical Medicine and Rehabilitation

## 2015-06-03 DIAGNOSIS — M546 Pain in thoracic spine: Secondary | ICD-10-CM

## 2015-06-03 DIAGNOSIS — M5134 Other intervertebral disc degeneration, thoracic region: Secondary | ICD-10-CM

## 2015-07-23 ENCOUNTER — Other Ambulatory Visit: Payer: Self-pay | Admitting: Internal Medicine

## 2015-07-23 DIAGNOSIS — Z981 Arthrodesis status: Secondary | ICD-10-CM

## 2015-07-23 DIAGNOSIS — M5136 Other intervertebral disc degeneration, lumbar region: Secondary | ICD-10-CM

## 2015-07-23 DIAGNOSIS — M5416 Radiculopathy, lumbar region: Secondary | ICD-10-CM

## 2015-08-02 ENCOUNTER — Ambulatory Visit
Admission: RE | Admit: 2015-08-02 | Discharge: 2015-08-02 | Disposition: A | Discharge status: Home / Self Care | Payer: Medicare Other | Source: Ambulatory Visit | Attending: Internal Medicine | Admitting: Internal Medicine

## 2015-08-02 DIAGNOSIS — M5136 Other intervertebral disc degeneration, lumbar region: Secondary | ICD-10-CM

## 2015-08-02 DIAGNOSIS — Z981 Arthrodesis status: Secondary | ICD-10-CM

## 2015-08-02 DIAGNOSIS — M5416 Radiculopathy, lumbar region: Secondary | ICD-10-CM

## 2015-08-10 ENCOUNTER — Encounter: Payer: Self-pay | Admitting: Interventional Cardiology

## 2015-08-18 ENCOUNTER — Ambulatory Visit: Payer: Medicare Other | Admitting: Pulmonary Disease

## 2015-09-02 ENCOUNTER — Encounter: Payer: Self-pay | Admitting: Interventional Cardiology

## 2015-09-02 ENCOUNTER — Ambulatory Visit (INDEPENDENT_AMBULATORY_CARE_PROVIDER_SITE_OTHER): Payer: Medicare Other | Admitting: Interventional Cardiology

## 2015-09-02 VITALS — BP 100/66 | HR 74 | Ht 64.5 in | Wt 199.8 lb

## 2015-09-02 DIAGNOSIS — Z951 Presence of aortocoronary bypass graft: Secondary | ICD-10-CM

## 2015-09-02 DIAGNOSIS — I481 Persistent atrial fibrillation: Secondary | ICD-10-CM | POA: Diagnosis not present

## 2015-09-02 DIAGNOSIS — I4819 Other persistent atrial fibrillation: Secondary | ICD-10-CM

## 2015-09-02 DIAGNOSIS — I10 Essential (primary) hypertension: Secondary | ICD-10-CM

## 2015-09-02 DIAGNOSIS — E785 Hyperlipidemia, unspecified: Secondary | ICD-10-CM

## 2015-09-02 MED ORDER — APIXABAN 5 MG PO TABS: 5.0000 mg | ORAL_TABLET | Freq: Two times a day (BID) | ORAL | Status: DC

## 2015-09-02 NOTE — Patient Instructions (Addendum)
Medication Instructions:  Your physician has recommended you make the following change in your medication:  1) STOP Amlodipine 2 )START Eliquis 5mg  twice daily. An Rx has been sent to your pharmacy   Labwork: Your physician recommends that you return for lab work (bmet) sameday as your echo   Testing/Procedures: Your physician has recommended that you wear a holter monitor. Holter monitors are medical devices that record the heart's electrical activity. Doctors most often use these monitors to diagnose arrhythmias. Arrhythmias are problems with the speed or rhythm of the heartbeat. The monitor is a small, portable device. You can wear one while you do your normal daily activities. This is usually used to diagnose what is causing palpitations/syncope (passing out).  Your physician has requested that you have an echocardiogram. Echocardiography is a painless test that uses sound waves to create images of your heart. It provides your doctor with information about the size and shape of your heart and how well your heart's chambers and valves are working. This procedure takes approximately one hour. There are no restrictions for this procedure.    Follow-Up: You have an appointment scheduled on 10/06/15 @ 10:15am with Dr.Smith  Any Other Special Instructions Will Be Listed Below (If Applicable).     If you need a refill on your cardiac medications before your next appointment, please call your pharmacy.

## 2015-09-02 NOTE — Progress Notes (Signed)
Cardiology Office Note    Date:  09/02/2015   ID:  [name], [name] 1940-12-12, MRN [id number]  PCP:  Jerlyn Ly, MD  Cardiologist: Sinclair Grooms, MD   Chief Complaint  Patient presents with  . Coronary Artery Disease  . Congestive Heart Failure    History of Present Illness:  [name] is a 75 y.o. female coronary artery disease with bypass surgery including LIMA to LAD, SVG to OM, and SVG to RCA 2013, hypertension, hyperlipidemia, history of intermittent shortness of breath, and chronic diastolic heart failure.  The patient complains of several months of extreme exertional fatigue. There is also dyspnea on exertion. She feels that something is wrong and wants [place] to be able to help her begin to feel better. She is not having chest discomfort or interscapular pain as she did prior to bypass surgery. She denies orthopnea or lower extremity swelling. She has not had palpitations.  Past Medical History  Diagnosis Date  . Coronary artery disease   . Angina   . Hypertension   . Dysrhythmia   . Shortness of breath   . Recurrent upper respiratory infection (URI)   . Hypothyroidism   . GERD (gastroesophageal reflux disease)   . Anxiety   . Depression   . Addison's disease ([place])   . Lumbar radiculopathy 04/27/2014  . 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  last knee surgery dr. Elmyra Ricks gave large dose of hydrocortisal and avoided the hypotensive side effectas of addison's disease.  [name] PONV (postoperative nausea and vomiting)   . Hyperlipemia   . Arthritis     "everywhere"  . Chronic lower back pain     Past Surgical History  Procedure Laterality Date  . Back surgery    . Tonsillectomy    . Diagnostic laparoscopy    . Dilation and curettage of uterus    . Joint replacement    . Laparoscopic cholecystectomy    . Carpal tunnel release Right   . Knee arthroscopy Bilateral   . Total knee arthroplasty Bilateral     . Revision total knee arthroplasty Right   . Anterior lumbar fusion  1980    L4-5  . Cataract extraction, bilateral Bilateral   . Cardiac catheterization  2013  . Coronary artery bypass graft  06/26/2011    Procedure: CORONARY ARTERY BYPASS GRAFTING (CABG);  Surgeon: Melrose Nakayama, MD;  Location: [facility];  Service: Open Heart Surgery;  Laterality: N/A;  times using Greater Saphenous Vein Graft harvested endoscopically from right leg  . Cardiac catheterization N/A 09/02/2014    Procedure: Left Heart Cath and Coronary Angiography;  Surgeon: Wellington Hampshire, MD;  Location: [facility];  Service: Cardiovascular;  Laterality: N/A;  . Achilles tendon surgery  07/16/14    left ankle, bone spur removed    Current Medications: Outpatient Prescriptions Prior to Visit  Medication Sig Dispense Refill  . acetaminophen (TYLENOL) 500 MG tablet Take 500 mg by mouth daily as needed for headache.    [name] amLODipine (NORVASC) 5 MG tablet Take 2.5 mg by mouth daily.     [name] aspirin EC 81 MG tablet Take 81 mg by mouth every morning.    . clonazePAM (KLONOPIN) 0.5 MG tablet Take 0.5-1 tablets (0.25-0.5 mg total) by mouth 2 (two) times daily as needed for anxiety. 60 tablet 3  . CYANOCOBALAMIN IJ Inject as directed every 30 (thirty) days.    [name]  FLUoxetine (PROZAC) 20 MG capsule Take 20 mg by mouth every morning.    . furosemide (LASIX) 20 MG tablet Take 1 tablet (20 mg total) by mouth daily. 30 tablet 9  . irbesartan (AVAPRO) 300 MG tablet Take 300 mg by mouth daily.    [name] levothyroxine (SYNTHROID, LEVOTHROID) 75 MCG tablet Take 75 mcg by mouth daily.    . nebivolol (BYSTOLIC) 5 MG tablet Take 2.5 mg by mouth at bedtime. Takes 2.5 mg daily    . NITROSTAT 0.4 MG SL tablet ONE TABLET UNDER TONGUE AS NEEDED FOR CHEST PAIN AS DIRECTED 25 tablet 3  . omeprazole (PRILOSEC) 20 MG capsule Take 20 mg by mouth every morning.     . Probiotic Product (PROBIOTIC FORMULA PO) Take 1 capsule by mouth every morning. For  upset stomach    . Vitamin D, Ergocalciferol, (DRISDOL) 50000 UNITS CAPS Take 50,000 Units by mouth 2 (two) times a week. Takes on Tuesdays and Fridays    . hydrocortisone (CORTEF) 20 MG tablet Take 10 mg by mouth in the AM and take 20 mg by mouth in the PM     No facility-administered medications prior to visit.     Allergies:   Percocet; Tizanidine; Sulfa antibiotics; and Sulfacetamide sodium   Social History   Social History  . Marital Status: Widowed    Spouse Name: N/A  . Number of Children: N/A  . Years of Education: N/A   Occupational History  . Retired    Social History Main Topics  . Smoking status: Passive Smoke Exposure - Never Smoker -- 0.00 packs/day for 0 years  . Smokeless tobacco: Never Used  . Alcohol Use: No  . Drug Use: No  . Sexual Activity: No   Other Topics Concern  . None   Social History Narrative     Family History:  The patient's family history includes Heart attack in her father; Hypertension in her maternal grandfather and mother; Stroke in her mother. There is no history of Colon cancer.   ROS:   Please see the history of present illness.    Shortness of breath with physical activity, lower back discomfort, dizziness, easy bruising, loud snoring, stating that friends will not go on trips with her anymore or sleep in the same room because of snoring.  All other systems reviewed and are negative.   PHYSICAL EXAM:   VS:  BP 100/66 mmHg  Pulse 74  Ht 5' 4.5" (1.638 m)  Wt 199 lb 12.8 oz (90.629 kg)  BMI 33.78 kg/m2   GEN: Well nourished, well developed, in no acute distress HEENT: normal Neck: no JVD, carotid bruits, or masses Cardiac: IIRR; no murmurs, rubs, or gallops,no edema.  Respiratory:  clear to auscultation bilaterally, normal work of breathing GI: soft, nontender, nondistended, + BS MS: no deformity or atrophy Skin: warm and dry, no rash Neuro:  Alert and Oriented x 3, Strength and sensation are intact Psych: euthymic mood,  full affect  Wt Readings from Last 3 Encounters:  09/02/15 199 lb 12.8 oz (90.629 kg)  08/02/15 196 lb (88.905 kg)  02/17/15 203 lb (92.08 kg)      Studies/Labs Reviewed:   EKG:  EKG  Atrial fibrillation with controlled ventricular response at 74 bpm. Otherwise no significant abnormality is noted other than incomplete right bundle.  Recent Labs: No results found for requested labs within last 365 days.   Lipid Panel    Component Value Date/Time   CHOL 100 09/02/2014 0346  TRIG 78 09/02/2014 0346   HDL 40* 09/02/2014 0346   CHOLHDL 2.5 09/02/2014 0346   VLDL 16 09/02/2014 0346   LDLCALC 44 09/02/2014 0346    Additional studies/ records that were reviewed today include:  Echocardiogram 09/02/14 Study Conclusions  - Left ventricle: The cavity size was normal. Wall thickness was  normal. Systolic function was normal. The estimated ejection  fraction was in the range of 60% to 65%. Wall motion was normal;  there were no regional wall motion abnormalities. Doppler  parameters are consistent with abnormal left ventricular  relaxation (grade 1 diastolic dysfunction). - Left atrium: The atrium was mildly dilated.    ASSESSMENT:    1. Persistent atrial fibrillation ([place])   2. S/P CABG x 3 2013- patent grafts 09/03/14   3. Essential hypertension   4. Hyperlipidemia      PLAN:  In order of problems listed above:  1. New atrial fibrillation of unknown duration. I assume that her exertional fatigue and dyspnea are related to this problem. No prior history of atrial fibrillation is detected in reviewing her chart.This patients CHA2DS2-VASc Score and unadjusted Ischemic Stroke Rate (% per year) is equal to 7.2 % stroke rate/year from a score of 5. She will need anticoagulation, Eliquis 5 mg twice a day, 2-D Doppler echocardiogram, 48 hour Holter monitor, and clinical follow-up in 3 weeks. Because of her symptoms she will likely need electrical cardioversion. After  anticoagulation for 3 weeks will consider starting either Tikosyn as an inpatient or amiodarone prior to planned electrical cardioversion. Will ultimately need a sleep study performed as it sounds as though she may have sleep apnea. She denies any symptoms that would suggest angina pectoris. Her grafts were noted to be patent less than a year ago. Blood pressure is relatively low, possibly related to loss of atrial kick. Discontinue amlodipine 2.5 mg per day. Not evaluated.     Medication Adjustments/Labs and Tests Ordered: Current medicines are reviewed at length with the patient today.  Concerns regarding medicines are outlined above.  Medication changes, Labs and Tests ordered today are listed in the Patient Instructions below. There are no Patient Instructions on file for this visit.   Signed, Sinclair Grooms, MD  09/02/2015 5:03 PM    [facility] [address], [place], [place]  [address] Phone: [phone]; Fax: [phone]

## 2015-09-03 ENCOUNTER — Telehealth: Payer: Self-pay

## 2015-09-03 NOTE — Telephone Encounter (Signed)
Eliquis has been approved by [facility]. Good through 04/02/2016. PA- [id number].

## 2015-09-03 NOTE — Telephone Encounter (Signed)
Prior auth for Eliquis 5 mg sent to [facility]. 

## 2015-09-15 ENCOUNTER — Encounter: Payer: Self-pay | Admitting: Pulmonary Disease

## 2015-09-15 ENCOUNTER — Ambulatory Visit (INDEPENDENT_AMBULATORY_CARE_PROVIDER_SITE_OTHER): Payer: Medicare Other | Admitting: Pulmonary Disease

## 2015-09-15 VITALS — BP 124/78 | HR 81 | Ht 64.5 in | Wt 199.4 lb

## 2015-09-15 DIAGNOSIS — Z951 Presence of aortocoronary bypass graft: Secondary | ICD-10-CM

## 2015-09-15 DIAGNOSIS — E669 Obesity, unspecified: Secondary | ICD-10-CM | POA: Diagnosis not present

## 2015-09-15 DIAGNOSIS — R0683 Snoring: Secondary | ICD-10-CM | POA: Diagnosis not present

## 2015-09-15 DIAGNOSIS — I480 Paroxysmal atrial fibrillation: Secondary | ICD-10-CM

## 2015-09-15 DIAGNOSIS — R918 Other nonspecific abnormal finding of lung field: Secondary | ICD-10-CM | POA: Diagnosis not present

## 2015-09-15 DIAGNOSIS — R0609 Other forms of dyspnea: Secondary | ICD-10-CM

## 2015-09-15 DIAGNOSIS — R0600 Dyspnea, unspecified: Secondary | ICD-10-CM

## 2015-09-15 DIAGNOSIS — I4891 Unspecified atrial fibrillation: Secondary | ICD-10-CM | POA: Insufficient documentation

## 2015-09-15 NOTE — Patient Instructions (Signed)
Today we updated your med list in our EPIC system...    Continue your current medications the same...  Continue the work up for your newly diagnosed AFib by DrHSmith...  Give the oral appliance prescribed by DrLutens a chance to see if it helps your snoring...    The next step would be a formal Sleep Study...  Call for any questions...  Let's plan a follow up visit in 32mo, sooner if needed for problems.Marland KitchenMarland Kitchen

## 2015-09-16 ENCOUNTER — Encounter: Payer: Self-pay | Admitting: Pulmonary Disease

## 2015-09-16 NOTE — Progress Notes (Signed)
Subjective:     Patient ID: [name], female   DOB: Aug 29, 1940, 75 y.o.   MRN: [id number]  HPI 75 y/o WF followed for dyspnea felt to be multifactorial & she is improved on Klonopin rx...  ~  September 07, 2014: Initial pulmonary consult w/ SN>        30 y/o WF, referred by DrPerini for a pulmonary evaluation to try & sort out the cause of her dyspnea> [name] saw DrWright in 2010 w 2 tiny left pulm nodules- no change serially & felt to be benign;  She presents on this occas w/ 9mo hx SOB, worse over the last several weeks, notes DOE walking room to room/ on stairs/ etc;  She thinks this is similar to her cardiac presentation previously but repeat cardiac eval recently was neg;  She had CABG in 2013 followed by Cardiac Rehab and did very well but by her own admission she has been sedentary over the last 82yrs due to "major back problems" evaluated by DrWillis & DrDalton-Bethea, on TENS & looking into nerve stim; also had 3 different TKRs and a toltal of 10 joint surgeries...  Her current symptoms include SOB- "I can't talk & walk due to giving out", notes trouble getting the air "IN", can't get a DB, doesn't feel satisfied breathing (a sensation that is relieved transiently by a deep sigh breath); note that review of old notes from 2010 indicates that pt was c/o SOB at rest & w/ activity at that time... Current Meds> no pulmonary meds;  She has hx CAD- s/p 3vessel CABG 2013, DM, HL, etc (note from DrPerrini reviewed)> on Repatha, Imdur, Bystolic, Amlodipine, Avapro, Omep, Levothy, Prozac...      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      EXAM showed Afeb, VSS, O2sat=95% on RA; Wt=205#, 5'4"Tall, BMI=34-35;  HEENT- neg, mallampati2;  Chest- clear w/o w/r/r;  Heart- RR, med stern scar, w/o m/r/g;  Abd- obese, panniculus, soft/neg;  Ext- VI w/o c/c/e...  Last CT Chest 01/29/13 showed stable cardiomeg, mild biapical pleuroparenchymal scarring, all prev noted noncalcif nodules are stable w/o change, no new lesions, no adenopathy, degen changes in Tspine...   Cath 09/2014 w/ 3 vessel CAD and patent grafts...  CXR 09/01/14 showed stable mild cardiomeg, s/p median sternotomy, lungs clear, DJD Tspine, NAD...   Spirometry 09/07/14 showed FVC=1.95 (70%), FEV1=1.47 (71%), %1sec=76, mid-flows are sl reduced at 66% predicted... This is c/w mild small airways dis & poss mild superimposed restriction...  Ambulatory oxygen saturation test 09/07/14> O2sat=95% on RA w/ pulse=69/min;  She walked 3 laps in the office w/ lowest O2sat=91% w/ pulse=93/min...   LABS 6/16:  Chems- ok x BS=155;  CBC- wnl;   IMP/PLAN>>  [name] is a 75 y/o woman w/ multisys dis- cardiac, ortho, chr back pain;  From the pulmonary standpoint she is a never smoker w/o hx pulmonary dis w/ an essentially clear CXR (s/p CABG), adeq oxygenation, and PFTs w/ poss mild restriction at most;  Her dyspnea is most likely multifactorial in etiology- w/ a type of "chest wall musc spasm"  suggested by her history;  In this regard I have suggested a trial of KLONOPIN 0.5mg  bid, and the use of an incentive spirometer as a lung exerciser... All questions were answered and she is requested to f/u in 4-6wks.   ~  October 19, 2014:  59mo ROV w/ SN>  [name] reports a rapid response to the Klonopin therapy w/ resolution of her dyspnea & she is feeling better overall; she has been able to wean down her dose in the interval- now taking 1/2 tab in the AM and 1 tab Qhs to help her rest (rests better & wakes feeling more energy etc);  She has checked into cardiac rehab for  an exercise program & she will pursue this once she gets her nerve stimulator implanted in her back for her severe LBP (per Dr.Dalton-Bethea and [place] DrKilpatrick in [place])...     EXAM reveals Afeb, VSS, O2sat=96% on RA;  HEENT- neg;  Chest- clear;  Heart- RR w/o m/r/g... IMP/PLAN>>  We discussed slowing weaning the Klonopin as tolerated; she will call for any questions or problems; plan ROV in 3-44mo...   ~  February 17, 2015:  39mo ROV w/ SN>  [name] continues to do well on the Klonopin- now down to 1/2 tab Qhs and occas 1/2 tab in AM if needed;  She notes sl SOB w/ exertion but denies cough, sput, hemoptysis, CP, etc;  She tells me that she had back surg 12/2014 by DrKilpatrick ([place]) in [place] w/ a neurostimulator implant, this device is controlled by remote & appears to be very successful, really helping her chr back pain;  She had a cardiac f/u w/ DrHSmith 09/2014- HBP, CAD, s/p CABG 0000000, diastolic CHF, dysrhythmia; cath 09/2014 showed patent grafts & she continues to do well; she tells me that she is anxious to start back into cardiac rehab after 1st of the yr...     [name] mentioned prob w/ snoring (& dry mouth in the AM) to me today- she denies OSA & knows a good bit about this since her husb was on CPAP for yrs (he passed away 8 yrs ago); her CC is snoring (knows this is a prob x yrs & recently underscored when on a trip w/ her girlfriends & her snoring was an issue for the group); she actually got good relief w/ breathe-right nasal strips but she is upset because the new strips will NOT stick to her nose;  We discussed several options to consider> ask Derm if the have a good adhesive, consider dental appliance OTC or from oral surg, consider home sleep study to check for OSA & perhaps even going w/ CPAP for the snoring (ie- pvt pay if there is no OSA on the study)...     EXAM reveals Afeb, VSS, O2sat=96% on RA;  HEENT- neg;  Chest- clear;  Heart- RR w/o m/r/g; Ext- VI w/o c/c/e... IMP/PLAN>>  [name] continues to do  well on the Klonopin, no inhalers needed; meds were adjusted by DrSmith (off Isosorbide), she is looking forward to starting cardiac rehab again... We discussed ROV in 50mo.   ~  September 15, 2015:  43mo ROV w/ SN>  [name] developed incr SOB last month & saw DrSmith w/ new AFib w/ controlled VR and he started Eliquis5Bid, and plans 2DEcho & 48H holter monitor (pending) followed by Amio vs Tikosyn and cardioversion;  He also mentioned the need for a Sleep Study (see above mention of snoring) & we discussed poss PSG testing but she tells  me that her periodontist DrLutens has made her an appliance that she wants to try first & she will let us know if & when she is readt to proceed to sleep testing... We reviewed the following medical problems during today's office visit >>     Dyspnea>  multifactorial => much improved on Klonopin 0.5mg Bid but still too sedentary (encouraged exercise program)    Cardiac issues>  HBP, HL, CAD- s/p 3 vessel CABG 0000000, DiastolicCHF, Atrial Fib (new 08/2015)> followed by DrHSmith on [id number], [id number], Bystolic5, [id number] (off prev Avapro/ Amlodipine); BP=124/78 and she notes some palpit but denies CP, dizzy, syncope, edema...    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  CT Angio Chest 05/05/15 per DrPerini> showed NEG for PE, heart size at upper lim of norm, atherosclerotic changes in Ao, no adenopathy, a few scat pulm nodules (all <49mm and unchanged from scan 2013), min basilar scarring, no abdominal findings etc...  XRays & MRI mof Tspine & Lspine in 2017>  Multifactorial sp stenosis at L3-4, and borderline impingement at C7-T1 and T10-11 due to  spondylosis and DDD, neurostimulator leads at T5-6 to T7...  IMP/PLAN>>  [name] is asked to continue the Klonopin rx;  She is under stress (son is a Pharmacist, community in [place] w/ recent abd wall hernia repair w/ complications) & this med will help w/ her dyspnea and the anxiety;  DrSmith is in the process of evaluating & treating her newly diagnosed AFib;  She wants to wait on a sleep eval & try DrLutens oral appliance first;  We plan recheck in 6 months...     Past Medical History  Diagnosis Date  . Coronary artery disease   . Angina   . Hypertension   . Dysrhythmia   . Shortness of breath   . Recurrent upper respiratory infection (URI)   . Hypothyroidism   . GERD (gastroesophageal reflux disease)   . Anxiety   . Depression   . Addison's disease ([place])   . Lumbar radiculopathy 04/27/2014  . 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  last knee surgery dr. Elmyra Ricks gave large dose of hydrocortisal and avoided the hypotensive side effectas of addison's disease.  [name] PONV (postoperative nausea and vomiting)   . Hyperlipemia   . Arthritis     "everywhere"  . Chronic lower back pain     Past Surgical History  Procedure Laterality Date  . Back surgery    . Tonsillectomy    . Diagnostic laparoscopy    . Dilation and curettage of uterus    . Joint replacement    . Laparoscopic cholecystectomy    . Carpal tunnel release Right   . Knee arthroscopy Bilateral   . Total knee arthroplasty Bilateral   . Revision total knee arthroplasty Right   . Anterior lumbar fusion  1980    L4-5  . Cataract extraction, bilateral Bilateral   . Cardiac catheterization  2013  . Coronary artery bypass graft  06/26/2011    Procedure: CORONARY ARTERY BYPASS GRAFTING (CABG);  Surgeon: Melrose Nakayama, MD;  Location: [facility];  Service:  Open Heart Surgery;  Laterality: N/A;  times using Greater Saphenous Vein Graft harvested endoscopically from right leg  . Cardiac  catheterization N/A 09/02/2014    Procedure: Left Heart Cath and Coronary Angiography;  Surgeon: Wellington Hampshire, MD;  Location: [facility];  Service: Cardiovascular;  Laterality: N/A;  . Achilles tendon surgery  07/16/14    left ankle, bone spur removed    Outpatient Encounter Prescriptions as of 09/15/2015  Medication Sig  . acetaminophen (TYLENOL) 500 MG tablet Take 500 mg by mouth daily as needed for headache.  [name] apixaban (ELIQUIS) 5 MG TABS tablet Take 1 tablet (5 mg total) by mouth 2 (two) times daily.  [name] aspirin EC 81 MG tablet Take 81 mg by mouth every morning.  . clonazePAM (KLONOPIN) 0.5 MG tablet Take 0.5-1 tablets (0.25-0.5 mg total) by mouth 2 (two) times daily as needed for anxiety.  . CYANOCOBALAMIN IJ Inject as directed every 30 (thirty) days.  [name] FLUoxetine (PROZAC) 20 MG capsule Take 20 mg by mouth every morning.  . furosemide (LASIX) 20 MG tablet Take 1 tablet (20 mg total) by mouth daily.  . hydrocortisone (CORTEF) 20 MG tablet Take 20 mg by mouth 2 (two) times daily.  [name] levothyroxine (SYNTHROID, LEVOTHROID) 75 MCG tablet Take 75 mcg by mouth daily.  . nebivolol (BYSTOLIC) 5 MG tablet Take 2.5 mg by mouth at bedtime. Takes 2.5 mg daily  . NITROSTAT 0.4 MG SL tablet ONE TABLET UNDER TONGUE AS NEEDED FOR CHEST PAIN AS DIRECTED  . omeprazole (PRILOSEC) 20 MG capsule Take 20 mg by mouth every morning.   . Probiotic Product (PROBIOTIC FORMULA PO) Take 1 capsule by mouth every morning. For upset stomach  . Vitamin D, Ergocalciferol, (DRISDOL) 50000 UNITS CAPS Take 50,000 Units by mouth 2 (two) times a week. Takes on Tuesdays and Fridays  . [DISCONTINUED] irbesartan (AVAPRO) 300 MG tablet Take 300 mg by mouth daily. Reported on 09/15/2015   No facility-administered encounter medications on file as of 09/15/2015.    Allergies  Allergen Reactions  . Percocet [Oxycodone-Acetaminophen]     Hallucination  . Tizanidine     unknown  . Sulfa Antibiotics Itching    Vaginal  itching  . Sulfacetamide Sodium Itching    Vaginal itching    Immunization History  Administered Date(s) Administered  . Influenza Split 01/01/2014  . Influenza-Unspecified 01/17/2015  . Pneumococcal-Unspecified 04/03/2012    Current Medications, Allergies, Past Medical History, Past Surgical History, Family History, and Social History were reviewed in [employer] record.   Review of Systems            All symptoms NEG except where BOLDED >>  Constitutional:  F/C/S, fatigue, anorexia, unexpected weight change. HEENT:  HA, visual changes, hearing loss, earache, nasal symptoms, sore throat, mouth sores, hoarseness. Resp:  cough, sputum, hemoptysis; SOB, tightness, wheezing. Cardio:  CP, palpit, DOE, orthopnea, edema. GI:  N/V/D/C, blood in stool; reflux, abd pain, distention, gas. GU:  dysuria, freq, urgency, hematuria, flank pain, voiding difficulty. MS:  joint pain, swelling, tenderness, decr ROM; neck pain, back pain, etc. Neuro:  HA, tremors, seizures, dizziness, syncope, weakness, numbness, gait abn. Skin:  suspicious lesions or skin rash. Heme:  adenopathy, bruising, bleeding. Psyche:  confusion, agitation, sleep disturbance, hallucinations, anxiety, depression suicidal.   Objective:   Physical Exam      Vital Signs:  Reviewed...  General:  WD, overweight, 75 y/o WF in NAD; alert & oriented; pleasant & cooperative... HEENT:  [place]/AT; Conjunctiva- pink, Sclera- nonicteric, EOM-wnl, PERRLA, EACs-clear, TMs-wnl; NOSE-clear; THROAT-clear & wnl. Neck:  Supple w/ fair ROM; no JVD; normal carotid impulses w/o bruits; no thyromegaly or nodules palpated; no lymphadenopathy. Chest:  Clear to P & A; without wheezes, rales, or rhonchi heard; median sternotomy scar... Heart:  irregular Rhythm; norm S1 & S2 without murmurs, rubs, or gallops detected. Abdomen:  Obese,soft & nontender- no guarding or rebound; normal bowel sounds; no organomegaly or masses  palpated. Ext:  +arthritic changes & TKRs; no varicose veins, +venous insuffic, tr edema;  Pulses intact w/o bruits. Neuro:  No focal neuro deficits; gait normal & balance OK. Derm:  No lesions noted; no rash etc. Lymph:  No cervical, supraclavicular, axillary, or inguinal adenopathy palpated.   Assessment:      IMP>>  Dyspnea -- multifactorial => much improved on Klonopin 0.5mg  & dose weaned slowly... HBP, CAD- s/p 3 vessel CABG 0000000, DiastolicCHF> followed by DrHSmith... Obesity> we reviewed diet, exercise, wt reduction strategies... Severe DJD -- s/p 3 TKRs and 10 joint surgeries Chronic LBP -- eval by DrWillis & DrDalton-Bethea => s/p implanted neurostim in back 12/2014 Mult medical problems -- DM, HL, others... Sedentary -- needs incr exercise, she will re-start cardiac rehab in Jan2017...  PLAN>>  7/18>  We discussed slowing weaning the Klonopin as tolerated; she will call for any questions or problems; plan ROV in 3-24mo 11/16>  [name] continues to do well on the Klonopin, no inhalers needed; meds were adjusted by DrSmith (off Isosorbide), she is looking forward to starting cardiac rehab again... 09/15/15>   [name] is asked to continue the Klonopin rx;  She is under stress (son is a Pharmacist, community in [place] w/ recent abd wall hernia repair w/ complications) & this med will help w/ her dyspnea and the anxiety;  DrSmith is in the process of evaluating & treating her newly diagnosed AFib;  She wants to wait on a sleep eval & try DrLutens oral appliance first;  We plan recheck in 6 months     Plan:       Patient's Medications  New Prescriptions   No medications on file  Previous Medications   ACETAMINOPHEN (TYLENOL) 500 MG TABLET    Take 500 mg by mouth daily as needed for headache.   APIXABAN (ELIQUIS) 5 MG TABS TABLET    Take 1 tablet (5 mg total) by mouth 2 (two) times daily.   ASPIRIN EC 81 MG TABLET    Take 81 mg by mouth every morning.   CLONAZEPAM (KLONOPIN) 0.5 MG TABLET    Take  0.5-1 tablets (0.25-0.5 mg total) by mouth 2 (two) times daily as needed for anxiety.   CYANOCOBALAMIN IJ    Inject as directed every 30 (thirty) days.   FLUOXETINE (PROZAC) 20 MG CAPSULE    Take 20 mg by mouth every morning.   FUROSEMIDE (LASIX) 20 MG TABLET    Take 1 tablet (20 mg total) by mouth daily.   HYDROCORTISONE (CORTEF) 20 MG TABLET    Take 20 mg by mouth 2 (two) times daily.   LEVOTHYROXINE (SYNTHROID, LEVOTHROID) 75 MCG TABLET    Take 75 mcg by mouth daily.   NEBIVOLOL (BYSTOLIC) 5 MG TABLET    Take 2.5 mg by mouth at bedtime. Takes 2.5 mg daily   NITROSTAT 0.4 MG SL TABLET    ONE TABLET UNDER TONGUE AS NEEDED FOR CHEST PAIN AS DIRECTED   OMEPRAZOLE (PRILOSEC) 20 MG CAPSULE    Take 20 mg by mouth  every morning.    PROBIOTIC PRODUCT (PROBIOTIC FORMULA PO)    Take 1 capsule by mouth every morning. For upset stomach   VITAMIN D, ERGOCALCIFEROL, (DRISDOL) 50000 UNITS CAPS    Take 50,000 Units by mouth 2 (two) times a week. Takes on Tuesdays and Fridays  Modified Medications   No medications on file  Discontinued Medications   IRBESARTAN (AVAPRO) 300 MG TABLET    Take 300 mg by mouth daily. Reported on 09/15/2015

## 2015-09-20 ENCOUNTER — Other Ambulatory Visit: Payer: Self-pay

## 2015-09-20 ENCOUNTER — Ambulatory Visit (HOSPITAL_COMMUNITY): Payer: Medicare Other | Attending: Internal Medicine

## 2015-09-20 ENCOUNTER — Ambulatory Visit (INDEPENDENT_AMBULATORY_CARE_PROVIDER_SITE_OTHER): Payer: Medicare Other

## 2015-09-20 DIAGNOSIS — Z6833 Body mass index (BMI) 33.0-33.9, adult: Secondary | ICD-10-CM | POA: Insufficient documentation

## 2015-09-20 DIAGNOSIS — I110 Hypertensive heart disease with heart failure: Secondary | ICD-10-CM | POA: Diagnosis not present

## 2015-09-20 DIAGNOSIS — I481 Persistent atrial fibrillation: Secondary | ICD-10-CM | POA: Diagnosis not present

## 2015-09-20 DIAGNOSIS — I2510 Atherosclerotic heart disease of native coronary artery without angina pectoris: Secondary | ICD-10-CM | POA: Diagnosis not present

## 2015-09-20 DIAGNOSIS — E669 Obesity, unspecified: Secondary | ICD-10-CM | POA: Insufficient documentation

## 2015-09-20 DIAGNOSIS — I4819 Other persistent atrial fibrillation: Secondary | ICD-10-CM

## 2015-09-20 DIAGNOSIS — I4891 Unspecified atrial fibrillation: Secondary | ICD-10-CM | POA: Diagnosis present

## 2015-09-20 DIAGNOSIS — E785 Hyperlipidemia, unspecified: Secondary | ICD-10-CM | POA: Diagnosis not present

## 2015-09-20 DIAGNOSIS — I340 Nonrheumatic mitral (valve) insufficiency: Secondary | ICD-10-CM | POA: Diagnosis not present

## 2015-09-20 DIAGNOSIS — I509 Heart failure, unspecified: Secondary | ICD-10-CM | POA: Insufficient documentation

## 2015-09-20 LAB — ECHOCARDIOGRAM COMPLETE
CHL CUP DOP CALC LVOT VTI: 21 cm
E decel time: 169 msec
FS: 31 % (ref 28–44)
IV/PV OW: 0.99
LA diam end sys: 46 mm
LA vol index: 21.4 mL/m2
LA vol: 41.9 mL
LADIAMINDEX: 2.35 cm/m2
LASIZE: 46 mm
LAVOLA4C: 38 mL
LV PW d: 16.1 mm — AB (ref 0.6–1.1)
LVOT area: 3.46 cm2
LVOT diameter: 21 mm
LVOTPV: 92.1 cm/s
LVOTSV: 73 mL
MV Dec: 169
MV Peak grad: 4 mmHg
MVPKAVEL: 31.5 m/s
MVPKEVEL: 102 m/s
TAPSE: 18.6 mm

## 2015-09-22 ENCOUNTER — Telehealth: Payer: Self-pay

## 2015-09-22 NOTE — Telephone Encounter (Signed)
The pt came by the office to drop off her holter monitor. While here she asked to speak to a nurse concerning her dizziness, fatigue and SOB.  According to her last OV notes on 6/1 with Dr Tamala Julian she has new atrial fibrillation and was having theses s/s at that [place]. Dr Tamala Julian ordered a holter monitor and placed the pt on Eliquis 5 mg BID. She has a f/u scheduled with Dr Tamala Julian on 7/5.  I advised the pt that we have not received the results of her holter monitor yet and that her s/s are the reason that Dr Tamala Julian ordered her monitor and put her on Eliquis. She is advised to be very careful as she ambulates, avoid driving, and to call us if her s/s worsen and that if the office is closed to call 911 or have someone drive her to the ER. She verbalized understanding and thanked me for talking with her.

## 2015-09-23 ENCOUNTER — Other Ambulatory Visit: Payer: Medicare Other

## 2015-09-23 ENCOUNTER — Telehealth: Payer: Self-pay | Admitting: Interventional Cardiology

## 2015-09-23 NOTE — Telephone Encounter (Signed)
Spoke with [name]. She reports she has had dizziness for 3-4 weeks. Saw [name] and she states she was told dizziness is related to atrial fib.  She turned monitor in yesterday. [name] has appt with [name] on 7/5 and is wondering if she needs sooner appt.  I told her once monitor reviewed by [name] we would contact her and arrange earlier appt if [name] felt she needed sooner appt. [name] is concerned about heart rate of 96.  I assured her this heart rate was OK.  [name] was to have blood work today but forgot appt.  She will come in tomorrow for lab work. I spoke with [facility] in monitor room and they have not received monitor report yet.  Usually takes 48-72 hours.

## 2015-09-23 NOTE — Telephone Encounter (Signed)
NEw Message  Pt stated that her dizziness has been getting worse over the last week or so- recorded BP today 150/81 and stated that her HR has been around 96. Pt requested to speak w/ rN. Please call back and discuss.

## 2015-09-24 ENCOUNTER — Ambulatory Visit (INDEPENDENT_AMBULATORY_CARE_PROVIDER_SITE_OTHER): Payer: Medicare Other | Admitting: Cardiology

## 2015-09-24 ENCOUNTER — Encounter: Payer: Self-pay | Admitting: Cardiology

## 2015-09-24 ENCOUNTER — Other Ambulatory Visit (INDEPENDENT_AMBULATORY_CARE_PROVIDER_SITE_OTHER): Payer: Medicare Other

## 2015-09-24 ENCOUNTER — Telehealth: Payer: Self-pay | Admitting: *Deleted

## 2015-09-24 VITALS — BP 161/115 | HR 82 | Resp 16 | Ht 64.5 in | Wt 198.4 lb

## 2015-09-24 DIAGNOSIS — I10 Essential (primary) hypertension: Secondary | ICD-10-CM | POA: Diagnosis not present

## 2015-09-24 DIAGNOSIS — I4819 Other persistent atrial fibrillation: Secondary | ICD-10-CM

## 2015-09-24 DIAGNOSIS — I481 Persistent atrial fibrillation: Secondary | ICD-10-CM

## 2015-09-24 DIAGNOSIS — R0600 Dyspnea, unspecified: Secondary | ICD-10-CM

## 2015-09-24 DIAGNOSIS — Z951 Presence of aortocoronary bypass graft: Secondary | ICD-10-CM | POA: Diagnosis not present

## 2015-09-24 DIAGNOSIS — R0609 Other forms of dyspnea: Secondary | ICD-10-CM | POA: Diagnosis not present

## 2015-09-24 LAB — BASIC METABOLIC PANEL
BUN: 13 mg/dL (ref 7–25)
CO2: 29 mmol/L (ref 20–31)
Calcium: 8.9 mg/dL (ref 8.6–10.4)
Chloride: 104 mmol/L (ref 98–110)
Creat: 0.78 mg/dL (ref 0.60–0.93)
Glucose, Bld: 146 mg/dL — ABNORMAL HIGH (ref 65–99)
POTASSIUM: 4.1 mmol/L (ref 3.5–5.3)
SODIUM: 141 mmol/L (ref 135–146)

## 2015-09-24 MED ORDER — AMLODIPINE BESYLATE 2.5 MG PO TABS: 2.5000 mg | ORAL_TABLET | Freq: Every day | ORAL | Status: DC

## 2015-09-24 MED ORDER — AMLODIPINE BESYLATE 5 MG PO TABS: 5.0000 mg | ORAL_TABLET | Freq: Every day | ORAL | Status: DC

## 2015-09-24 NOTE — Telephone Encounter (Signed)
Pt reported for her lab appt today, then became a walk-in, with on-going complaints of sob, fatigue, dizziness, DOE, and high BP.  Pt requested to talk too see Dr Tamala Julian sooner than 7/5, for on-going complaints.  Did take pts BP and noted it at 174/100 and HR 92-110 irregular 95 % RA, and Resp- 18 Pt has newly diagnosed afib and just started Eliquis per Dr Tamala Julian.  Pts amlodipine 2.5 mg was d/c'ed at her OV with Dr Tamala Julian on 6/2 , due to hypotension.  Spoke with the Flex Provider [name] NP, and she advises to have the pt added to her schedule, and will see her now for further eval and medication adjustments.  Pt added to Laura's schedule at 1 pm. Will forward this note to Dr Tamala Julian and covering CMA to make them aware of this.

## 2015-09-24 NOTE — Progress Notes (Signed)
Cardiology Office Note   Date:  09/24/2015   ID:  Mkenna, Saar [birth year], MRN [id number]  PCP:  Jerlyn Ly, MD  Cardiologist:  [name]    Chief Complaint  Patient presents with  . Shortness of Breath      History of Present Illness: [name] is a 75 y.o. female who presents for SOB and weakness and now with elevated BP.      She has a hx. of coronary artery disease with bypass surgery including LIMA to LAD, SVG to OM, and SVG to RCA 2013, hypertension, hyperlipidemia, history of intermittent shortness of breath, and chronic diastolic heart failure.  On last visit she was found to be in a fib rate controlled and borderline hypotensive.  Her amlodipine was stopped and she was placed on Eliquis with plans for DCCV and possibly adding tikosyn.    Today she had complaints of fast HR and and HTN when she brought back her holter monitor.  She brings a list of of her BP readings and they have gradually increased since seen in the office.  Today 160/115.  She is SOB with this.  No chest pain.  No SOB at rest.  Some dizziness.    She notes initially she felt great then this week started feeling bad.  She attributed it to the eliquis, explained that most likely he BP came up, so she felt better but now it is too high increasing the pressure.       Her Holter results not yet processed.        Past Medical History  Diagnosis Date  . Coronary artery disease   . Angina   . Hypertension   . Dysrhythmia   . Shortness of breath   . Recurrent upper respiratory infection (URI)   . Hypothyroidism   . GERD (gastroesophageal reflux disease)   . Anxiety   . Depression   . Addison's disease ([place])   . Lumbar radiculopathy 04/27/2014  . 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  last knee surgery dr. Elmyra Ricks gave large dose of hydrocortisal and avoided the hypotensive side effectas of addison's disease.  [name] PONV (postoperative  nausea and vomiting)   . Hyperlipemia   . Arthritis     "everywhere"  . Chronic lower back pain     Past Surgical History  Procedure Laterality Date  . Back surgery    . Tonsillectomy    . Diagnostic laparoscopy    . Dilation and curettage of uterus    . Joint replacement    . Laparoscopic cholecystectomy    . Carpal tunnel release Right   . Knee arthroscopy Bilateral   . Total knee arthroplasty Bilateral   . Revision total knee arthroplasty Right   . Anterior lumbar fusion  1980    L4-5  . Cataract extraction, bilateral Bilateral   . Cardiac catheterization  2013  . Coronary artery bypass graft  06/26/2011    Procedure: CORONARY ARTERY BYPASS GRAFTING (CABG);  Surgeon: Melrose Nakayama, MD;  Location: [facility];  Service: Open Heart Surgery;  Laterality: N/A;  times using Greater Saphenous Vein Graft harvested endoscopically from right leg  . Cardiac catheterization N/A 09/02/2014    Procedure: Left Heart Cath and Coronary Angiography;  Surgeon: Wellington Hampshire, MD;  Location: [facility];  Service: Cardiovascular;  Laterality: N/A;  . Achilles tendon surgery  07/16/14    left ankle, bone  spur removed  . Nerve stimulator of back  12/10/15     Current Outpatient Prescriptions  Medication Sig Dispense Refill  . acetaminophen (TYLENOL) 500 MG tablet Take 500 mg by mouth daily as needed for headache.    [name] apixaban (ELIQUIS) 5 MG TABS tablet Take 1 tablet (5 mg total) by mouth 2 (two) times daily. 60 tablet 11  . aspirin EC 81 MG tablet Take 81 mg by mouth every morning.    . clonazePAM (KLONOPIN) 0.5 MG tablet Take 0.5-1 tablets (0.25-0.5 mg total) by mouth 2 (two) times daily as needed for anxiety. 60 tablet 3  . CYANOCOBALAMIN IJ Inject as directed every 30 (thirty) days.    [name] FLUoxetine (PROZAC) 20 MG capsule Take 20 mg by mouth every morning.    . furosemide (LASIX) 20 MG tablet Take 20 mg by mouth as needed for fluid or edema.    . hydrocortisone (CORTEF) 20 MG tablet  Take 20 mg by mouth 2 (two) times daily.    . hyoscyamine (LEVSIN SL) 0.125 MG SL tablet Take 0.125 mg by mouth daily.     . irbesartan (AVAPRO) 300 MG tablet Take 300 mg by mouth daily.     [name] levothyroxine (SYNTHROID, LEVOTHROID) 75 MCG tablet Take 75 mcg by mouth daily.    . nebivolol (BYSTOLIC) 5 MG tablet Take 2.5 mg by mouth at bedtime. Takes 2.5 mg daily    . nitroGLYCERIN (NITROSTAT) 0.4 MG SL tablet Place 0.4 mg under the tongue every 5 (five) minutes as needed for chest pain (3 DOSES MAX FOR CHEST PAIN).    [name] omeprazole (PRILOSEC) 20 MG capsule Take 20 mg by mouth every morning.     . Probiotic Product (PROBIOTIC FORMULA PO) Take 1 capsule by mouth every morning. For upset stomach    . Vitamin D, Ergocalciferol, (DRISDOL) 50000 UNITS CAPS Take 50,000 Units by mouth 2 (two) times a week. Takes on Tuesdays and Fridays     No current facility-administered medications for this visit.    Allergies:   Tizanidine; Percocet; Sulfa antibiotics; and Sulfacetamide sodium    Social History:  The patient  reports that she has been passively smoking.  She has never used smokeless tobacco. She reports that she does not drink alcohol or use illicit drugs.   Family History:  The patient's family history includes Heart attack in her father; Hypertension in her maternal grandfather and mother; Stroke in her mother. There is no history of Colon cancer.    ROS:  General:no colds or fevers, no weight changes Skin:no rashes or ulcers HEENT:no blurred vision, no congestion CV:see HPI PUL:see HPI GI:no diarrhea constipation or melena, no indigestion GU:no hematuria, no dysuria MS:no joint pain, no claudication Neuro:no syncope, + lightheadedness Endo:no diabetes, + thyroid disease  Wt Readings from Last 3 Encounters:  09/24/15 198 lb 6.4 oz (89.994 kg)  09/15/15 199 lb 6.4 oz (90.447 kg)  09/02/15 199 lb 12.8 oz (90.629 kg)     PHYSICAL EXAM: VS:  BP 161/115 mmHg  Pulse 82  Resp 16  Ht 5'  4.5" (1.638 m)  Wt 198 lb 6.4 oz (89.994 kg)  BMI 33.54 kg/m2  SpO2 95% , BMI Body mass index is 33.54 kg/(m^2). General:Pleasant affect, NAD Skin:Warm and dry, brisk capillary refill HEENT:normocephalic, sclera clear, mucus membranes moist Neck:supple, no JVD, no bruits  Heart:Irreg irreg without murmur, gallup, rub or click Lungs:clear without rales, rhonchi, or wheezes [id number], non tender, + BS, do not palpate liver spleen  or masses Ext:no lower ext edema, 2+ pedal pulses, 2+ radial pulses Neuro:alert and oriented X 3, MAE, follows commands, + facial symmetry    EKG:  EKG is ordered today. The ekg ordered today demonstrates a fib rate controlled at 82.  No acute changes,    Recent Labs: No results found for requested labs within last 365 days.    Lipid Panel    Component Value Date/Time   CHOL 100 09/02/2014 0346   TRIG 78 09/02/2014 0346   HDL 40* 09/02/2014 0346   CHOLHDL 2.5 09/02/2014 0346   VLDL 16 09/02/2014 0346   LDLCALC 44 09/02/2014 0346       Other studies Reviewed: Additional studies/ records that were reviewed today include:  ECHO 09/20/15  Study Conclusions  - Left ventricle: The cavity size was normal. Wall thickness was  increased in a pattern of moderate LVH. Systolic function was  vigorous. The estimated ejection fraction was in the range of 65%  to 70%. - Mitral valve: There was mild regurgitation. - Pulmonary arteries: PA peak pressure: 35 mm Hg (S).  -------------------------------------------------------------------   ASSESSMENT AND PLAN:  1.  A fib, now on Eliquis, and mostly rate controlled though I do not have holter results.     2. HTN- I believe her symptoms are related to her uncontrolled HTN today.  Will resume amlodipine at previous dose.  She tells me she was taking whole pill which would be 5 mg.  She will resume and call over the weekend if BP remains elevated and would consider increasing amlodipine.   I did not  increase BB as rate was 82 and at times slower and did not have holter results back but that too is an option.  She will return on Monday for BP check and then follow up with [name] in July as scheduled to arrange DCCV.     3. CAD with hx of CABG 2013.  No chest pain.   4. LVH    Current medicines are reviewed with the patient today.  The patient Has no concerns regarding medicines.  The following changes have been made:  See above Labs/ tests ordered today include:see above  Disposition:   FU:  see above  Signed, [name], NP  09/24/2015 1:59 PM    [facility], [address] [place], [place] Phone: [phone]; Fax: [phone]

## 2015-09-24 NOTE — Patient Instructions (Addendum)
Medication Instructions:   START TAKING  5 MG ONCE A DAY OF AMLODIPINE   If you need a refill on your cardiac medications before your next appointment, please call your pharmacy.  Labwork:  NONE ORDER TODAY    Testing/Procedures:  NONE ORDER TODAY    Follow-Up: ON Monday  WITH NURSE VISIT OR PHARM D FOR BLOOD PRESSURE CHECK    Any Other Special Instructions Will Be Listed Below (If Applicable).

## 2015-09-26 NOTE — Telephone Encounter (Signed)
Will discuss at OV

## 2015-09-27 ENCOUNTER — Encounter: Payer: Medicare Other | Admitting: Pharmacist

## 2015-09-27 NOTE — Progress Notes (Signed)
Patient ID: [name]                 DOB: 1940/04/20                      MRN: [id number]     HPI: [name] is a 75 y.o. female referred by [name], NP to HTN clinic. PMH is significant for CAD s/p CABG in 2013, HTN, HLD, diastolic HF (LVEF [id number]), and afib. Pt was diagnosed with afib 3 weeks ago and amlodipine was discontinued d/t hypotension at OV with [name]. Pt was then seen last week in flex clinic d/t complaints of SOB, fatigue, dizziness, DOE, and elevated BP. Home BP was up to 174/100 at home. Amlodipine 5mg  was restarted 3 days ago. Pt presents for BP f/u.  Current HTN meds: amlodipine 5mg  daily, Lasix 20mg  prn, nebivolol 2.5mg  daily Previously tried:  BP goal: < 150/23mmHg  Family History:   Social History:   Diet:   Exercise:   Home BP readings:   Wt Readings from Last 3 Encounters:  09/24/15 198 lb 6.4 oz (89.994 kg)  09/15/15 199 lb 6.4 oz (90.447 kg)  09/02/15 199 lb 12.8 oz (90.629 kg)   BP Readings from Last 3 Encounters:  09/24/15 161/115  09/15/15 124/78  09/02/15 100/66   Pulse Readings from Last 3 Encounters:  09/24/15 82  09/15/15 81  09/02/15 74    Renal function: Estimated Creatinine Clearance: 65.6 mL/min (by C-G formula based on Cr of 0.78).  Past Medical History  Diagnosis Date  . Coronary artery disease   . Angina   . Hypertension   . Dysrhythmia   . Shortness of breath   . Recurrent upper respiratory infection (URI)   . Hypothyroidism   . GERD (gastroesophageal reflux disease)   . Anxiety   . Depression   . Addison's disease ([name])   . Lumbar radiculopathy 04/27/2014  . 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  last knee surgery dr. Elmyra Ricks gave large dose of hydrocortisal and avoided the hypotensive side effectas of addison's disease.  [name] PONV (postoperative nausea and vomiting)   . Hyperlipemia   . Arthritis     "everywhere"  . Chronic lower back pain      Current Outpatient Prescriptions on File Prior to Visit  Medication Sig Dispense Refill  . acetaminophen (TYLENOL) 500 MG tablet Take 500 mg by mouth daily as needed for headache.    [name] amLODipine (NORVASC) 5 MG tablet Take 1 tablet (5 mg total) by mouth daily. 30 tablet 6  . apixaban (ELIQUIS) 5 MG TABS tablet Take 1 tablet (5 mg total) by mouth 2 (two) times daily. 60 tablet 11  . aspirin EC 81 MG tablet Take 81 mg by mouth every morning.    . clonazePAM (KLONOPIN) 0.5 MG tablet Take 0.5-1 tablets (0.25-0.5 mg total) by mouth 2 (two) times daily as needed for anxiety. 60 tablet 3  . CYANOCOBALAMIN IJ Inject as directed every 30 (thirty) days.    [name] FLUoxetine (PROZAC) 20 MG capsule Take 20 mg by mouth every morning.    . furosemide (LASIX) 20 MG tablet Take 20 mg by mouth as needed for fluid or edema.    . hydrocortisone (CORTEF) 20 MG tablet Take 20 mg by mouth 2 (two) times daily.    . hyoscyamine (LEVSIN SL) 0.125 MG SL tablet Take 0.125 mg by mouth daily.     [name]  irbesartan (AVAPRO) 300 MG tablet Take 300 mg by mouth daily.     [name] levothyroxine (SYNTHROID, LEVOTHROID) 75 MCG tablet Take 75 mcg by mouth daily.    . nebivolol (BYSTOLIC) 5 MG tablet Take 2.5 mg by mouth at bedtime. Takes 2.5 mg daily    . nitroGLYCERIN (NITROSTAT) 0.4 MG SL tablet Place 0.4 mg under the tongue every 5 (five) minutes as needed for chest pain (3 DOSES MAX FOR CHEST PAIN).    [name] omeprazole (PRILOSEC) 20 MG capsule Take 20 mg by mouth every morning.     . Probiotic Product (PROBIOTIC FORMULA PO) Take 1 capsule by mouth every morning. For upset stomach    . Vitamin D, Ergocalciferol, (DRISDOL) 50000 UNITS CAPS Take 50,000 Units by mouth 2 (two) times a week. Takes on Tuesdays and Fridays     No current facility-administered medications on file prior to visit.    Allergies  Allergen Reactions  . Tizanidine Other (See Comments)    HALLUCINATIONS  . Percocet [Oxycodone-Acetaminophen] Other (See Comments)     Hallucination  . Sulfa Antibiotics Itching    Vaginal itching  . Sulfacetamide Sodium Itching    Vaginal itching     Assessment/Plan:  1. HTN - Inc bystolic if sx afib related, otherwise inc amlodipine  Pt has follow up with [name] in 1 week.   This encounter was created in error - please disregard.

## 2015-10-05 NOTE — Progress Notes (Signed)
Cardiology Office Note    Date:  10/06/2015   ID:  [name], [name] 1940/06/16, MRN [id number]  PCP:  Jerlyn Ly, MD  Cardiologist: Sinclair Grooms, MD   Chief Complaint  Patient presents with  . Follow-up    History of Present Illness:  [name] is a 75 y.o. female for f/u of atrial fibrillation, coronary artery disease with bypass surgery including LIMA to LAD, SVG to OM, and SVG to RCA 2013, hypertension, hyperlipidemia, history of intermittent shortness of breath, and chronic diastolic heart failure.  Recently, amlodipine was resumed due to elevated BP. She has not felt well due to AF causing dyspnea.After resuming amlodipine, she has begun to feel better. She does get dizziness shortly after taking the a.m. dose of medications which includes Avapro, amlodipine, and Bystolic. She denies palpitations. Since starting Eliquis one month ago, there is been no bleeding complications.  Past Medical History  Diagnosis Date  . Coronary artery disease   . Angina   . Hypertension   . Dysrhythmia   . Shortness of breath   . Recurrent upper respiratory infection (URI)   . Hypothyroidism   . GERD (gastroesophageal reflux disease)   . Anxiety   . Depression   . Addison's disease ([place])   . Lumbar radiculopathy 04/27/2014  . 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  last knee surgery dr. Elmyra Ricks gave large dose of hydrocortisal and avoided the hypotensive side effectas of addison's disease.  [name] PONV (postoperative nausea and vomiting)   . Hyperlipemia   . Arthritis     "everywhere"  . Chronic lower back pain     Past Surgical History  Procedure Laterality Date  . Back surgery    . Tonsillectomy    . Diagnostic laparoscopy    . Dilation and curettage of uterus    . Joint replacement    . Laparoscopic cholecystectomy    . Carpal tunnel release Right   . Knee arthroscopy Bilateral   . Total knee arthroplasty Bilateral    . Revision total knee arthroplasty Right   . Anterior lumbar fusion  1980    L4-5  . Cataract extraction, bilateral Bilateral   . Cardiac catheterization  2013  . Coronary artery bypass graft  06/26/2011    Procedure: CORONARY ARTERY BYPASS GRAFTING (CABG);  Surgeon: Melrose Nakayama, MD;  Location: [facility];  Service: Open Heart Surgery;  Laterality: N/A;  times using Greater Saphenous Vein Graft harvested endoscopically from right leg  . Cardiac catheterization N/A 09/02/2014    Procedure: Left Heart Cath and Coronary Angiography;  Surgeon: Wellington Hampshire, MD;  Location: [facility];  Service: Cardiovascular;  Laterality: N/A;  . Achilles tendon surgery  07/16/14    left ankle, bone spur removed  . Nerve stimulator of back  12/10/15    Current Medications: Outpatient Prescriptions Prior to Visit  Medication Sig Dispense Refill  . acetaminophen (TYLENOL) 500 MG tablet Take 500 mg by mouth daily as needed for headache.    [name] amLODipine (NORVASC) 5 MG tablet Take 1 tablet (5 mg total) by mouth daily. 30 tablet 6  . apixaban (ELIQUIS) 5 MG TABS tablet Take 1 tablet (5 mg total) by mouth 2 (two) times daily. 60 tablet 11  . aspirin EC 81 MG tablet Take 81 mg by mouth every morning.    . clonazePAM (KLONOPIN) 0.5 MG tablet Take 0.5-1 tablets (0.25-0.5 mg total)  by mouth 2 (two) times daily as needed for anxiety. 60 tablet 3  . CYANOCOBALAMIN IJ Inject as directed every 30 (thirty) days.    [name] FLUoxetine (PROZAC) 20 MG capsule Take 20 mg by mouth every morning.    . furosemide (LASIX) 20 MG tablet Take 20 mg by mouth as needed for fluid or edema.    . hydrocortisone (CORTEF) 20 MG tablet Take 20 mg by mouth 2 (two) times daily.    . hyoscyamine (LEVSIN SL) 0.125 MG SL tablet Take 0.125 mg by mouth daily.     . irbesartan (AVAPRO) 300 MG tablet Take 300 mg by mouth daily.     [name] levothyroxine (SYNTHROID, LEVOTHROID) 75 MCG tablet Take 75 mcg by mouth daily.    . nebivolol (BYSTOLIC) 5 MG  tablet Take 2.5 mg by mouth at bedtime. Takes 2.5 mg daily    . nitroGLYCERIN (NITROSTAT) 0.4 MG SL tablet Place 0.4 mg under the tongue every 5 (five) minutes as needed for chest pain (3 DOSES MAX FOR CHEST PAIN).    [name] omeprazole (PRILOSEC) 20 MG capsule Take 20 mg by mouth every morning.     . Probiotic Product (PROBIOTIC FORMULA PO) Take 1 capsule by mouth every morning. For upset stomach    . Vitamin D, Ergocalciferol, (DRISDOL) 50000 UNITS CAPS Take 50,000 Units by mouth 2 (two) times a week. Takes on Tuesdays and Fridays     No facility-administered medications prior to visit.     Allergies:   Tizanidine; Percocet; Sulfa antibiotics; and Sulfacetamide sodium   Social History   Social History  . Marital Status: Widowed    Spouse Name: N/A  . Number of Children: N/A  . Years of Education: N/A   Occupational History  . Retired    Social History Main Topics  . Smoking status: Passive Smoke Exposure - Never Smoker -- 0.00 packs/day for 0 years  . Smokeless tobacco: Never Used  . Alcohol Use: No  . Drug Use: No  . Sexual Activity: No   Other Topics Concern  . None   Social History Narrative     Family History:  The patient's family history includes Heart attack in her father; Hypertension in her maternal grandfather and mother; Stroke in her mother. There is no history of Colon cancer.   ROS:   Please see the history of present illness.    Abdominal pain, diarrhea, dizziness, easy bruising.  All other systems reviewed and are negative.   PHYSICAL EXAM:   VS:  BP 150/80 mmHg  Pulse 77  Ht 5' 4.5" (1.638 m)  Wt 197 lb 12.8 oz (89.721 kg)  BMI 33.44 kg/m2   GEN: Well nourished, well developed, in no acute distress HEENT: normal Neck: no JVD, carotid bruits, or masses Cardiac: IIRR; no murmurs, rubs, or gallops,no edema  Respiratory:  clear to auscultation bilaterally, normal work of breathing GI: soft, nontender, nondistended, + BS MS: no deformity or  atrophy Skin: warm and dry, no rash Neuro:  Alert and Oriented x 3, Strength and sensation are intact Psych: euthymic mood, full affect  Wt Readings from Last 3 Encounters:  10/06/15 197 lb 12.8 oz (89.721 kg)  09/24/15 198 lb 6.4 oz (89.994 kg)  09/15/15 199 lb 6.4 oz (90.447 kg)      Studies/Labs Reviewed:   EKG:  EKG  Atrial fibrillation with controlled rate of 77 bpm.  Recent Labs: 09/24/2015: BUN 13; Creat 0.78; Potassium 4.1; Sodium 141   Lipid Panel  Component Value Date/Time   CHOL 100 09/02/2014 0346   TRIG 78 09/02/2014 0346   HDL 40* 09/02/2014 0346   CHOLHDL 2.5 09/02/2014 0346   VLDL 16 09/02/2014 0346   LDLCALC 44 09/02/2014 0346    Additional studies/ records that were reviewed today include:  Holter monitor performed on 09/20/15 revealed persistent atrial fibrillation with rate control.  ECHOCARDIOGRAM: 09/20/15  Study Conclusions  - Left ventricle: The cavity size was normal. Wall thickness was  increased in a pattern of moderate LVH. Systolic function was  vigorous. The estimated ejection fraction was in the range of 65%  to 70%. - Mitral valve: There was mild regurgitation. - Pulmonary arteries: PA peak pressure: 35 mm Hg (S). -Normal left atrial size  ASSESSMENT:    1. Persistent atrial fibrillation ([place])   2. Essential hypertension   3. Addison disease ([place])   4. S/P CABG x 3 2013- patent grafts 09/03/14   5. Hyperlipidemia with target LDL less than 70      PLAN:  In order of problems listed above:  1. The patient has persistent atrial fibrillation. With rate control, she has begun to feel better. Dyspnea is not as severe. There is no chest discomfort. She has not had syncope. Energy levels have markedly improved.              We will plan to start the patient on amiodarone 200 mg twice a day. She needs to continue this       therapy for at least 2 weeks prior to cardioversion. Cardioversion will be set for 2 weeks from now.  Amiodarone  dose should be decreased after successful cardioversion to 200 mg per day. The nature of the procedure including risks of CVA, and mechanical injury were discussed in detail and accepted by the patient. 2. BP control is variable. Relatively high today but she has not yet had her a.m. medications. We may have to further intensified antihypertensive therapy depending upon blood pressure recordings. 3. No bleeding complications on anticoagulation therapy. This therapy will be continued as listed. 4. Amiodarone 200 mg twice a day is started today. She will need at least 2 weeks of twice a day therapy prior to cardioversion. At cardioversion we will decrease amiodarone to 200 mg daily.Bystolic will likely be discontinued.    Medication Adjustments/Labs and Tests Ordered: Current medicines are reviewed at length with the patient today.  Concerns regarding medicines are outlined above.  Medication changes, Labs and Tests ordered today are listed in the Patient Instructions below. Patient Instructions  Medication Instructions:  Your physician has recommended you make the following change in your medication:  1) START Amiodarone 200mg  twice daily. Starting the day of your procedure reduce to 200mg  once daily. An Rx has been sent to your pharmacy.  2)Take your last dose of Bystolic on 0000000 the day before your procedure   Labwork: Your physician recommends that you return for lab work on 10/21/15 (bmet)   Testing/Procedures: Your physician has recommended that you have a Cardioversion (DCCV). Electrical Cardioversion uses a jolt of electricity to your heart either through paddles or wired patches attached to your chest. This is a controlled, usually prescheduled, procedure. Defibrillation is done under light anesthesia in the hospital, and you usually go home the day of the procedure. This is done to get your heart back into a normal rhythm. You are not awake for the procedure. Please see the instruction  sheet given to you today. (Scheduled on 10/22/15 @  1 pm) please follow the instructions on your instruction letter   Follow-Up: You have a EKG nurse visit scheduled on 10/21/15 @11am   You have an appointment scheduled with Dr.Smith on 11/09/15 @ 11:30am  Any Other Special Instructions Will Be Listed Below (If Applicable).     If you need a refill on your cardiac medications before your next appointment, please call your pharmacy.       Signed, Sinclair Grooms, MD  10/06/2015 11:10 AM    [facility] [place], [place], [place]  [address] Phone: [phone]; Fax: [phone]

## 2015-10-06 ENCOUNTER — Encounter: Payer: Self-pay | Admitting: Interventional Cardiology

## 2015-10-06 ENCOUNTER — Other Ambulatory Visit: Payer: Self-pay | Admitting: Interventional Cardiology

## 2015-10-06 ENCOUNTER — Encounter (INDEPENDENT_AMBULATORY_CARE_PROVIDER_SITE_OTHER): Payer: Self-pay

## 2015-10-06 ENCOUNTER — Ambulatory Visit (INDEPENDENT_AMBULATORY_CARE_PROVIDER_SITE_OTHER): Payer: Medicare Other | Admitting: Interventional Cardiology

## 2015-10-06 VITALS — BP 150/80 | HR 77 | Ht 64.5 in | Wt 197.8 lb

## 2015-10-06 DIAGNOSIS — Z79899 Other long term (current) drug therapy: Secondary | ICD-10-CM | POA: Insufficient documentation

## 2015-10-06 DIAGNOSIS — Z7901 Long term (current) use of anticoagulants: Secondary | ICD-10-CM | POA: Insufficient documentation

## 2015-10-06 DIAGNOSIS — I10 Essential (primary) hypertension: Secondary | ICD-10-CM

## 2015-10-06 DIAGNOSIS — I4819 Other persistent atrial fibrillation: Secondary | ICD-10-CM

## 2015-10-06 DIAGNOSIS — I481 Persistent atrial fibrillation: Secondary | ICD-10-CM

## 2015-10-06 DIAGNOSIS — Z951 Presence of aortocoronary bypass graft: Secondary | ICD-10-CM

## 2015-10-06 MED ORDER — AMIODARONE HCL 200 MG PO TABS: ORAL_TABLET | ORAL | Status: DC

## 2015-10-06 NOTE — Patient Instructions (Addendum)
Medication Instructions:  Your physician has recommended you make the following change in your medication:  1) START Amiodarone 200mg  twice daily. Starting the day of your procedure reduce to 200mg  once daily. An Rx has been sent to your pharmacy.  2)Take your last dose of Bystolic on 0000000 the day before your procedure   Labwork: Your physician recommends that you return for lab work on 10/21/15 (bmet)   Testing/Procedures: Your physician has recommended that you have a Cardioversion (DCCV). Electrical Cardioversion uses a jolt of electricity to your heart either through paddles or wired patches attached to your chest. This is a controlled, usually prescheduled, procedure. Defibrillation is done under light anesthesia in the hospital, and you usually go home the day of the procedure. This is done to get your heart back into a normal rhythm. You are not awake for the procedure. Please see the instruction sheet given to you today. (Scheduled on 10/22/15 @ 1 pm) please follow the instructions on your instruction letter   Follow-Up: You have a EKG nurse visit scheduled on 10/21/15 @11am   You have an appointment scheduled with Dr.Smith on 11/09/15 @ 11:30am  Any Other Special Instructions Will Be Listed Below (If Applicable).     If you need a refill on your cardiac medications before your next appointment, please call your pharmacy.

## 2015-10-08 ENCOUNTER — Ambulatory Visit (INDEPENDENT_AMBULATORY_CARE_PROVIDER_SITE_OTHER): Payer: Medicare Other | Admitting: Gastroenterology

## 2015-10-08 ENCOUNTER — Encounter: Payer: Self-pay | Admitting: Gastroenterology

## 2015-10-08 ENCOUNTER — Other Ambulatory Visit (INDEPENDENT_AMBULATORY_CARE_PROVIDER_SITE_OTHER): Payer: Medicare Other

## 2015-10-08 VITALS — BP 124/76 | HR 80 | Ht 64.0 in | Wt 199.0 lb

## 2015-10-08 DIAGNOSIS — R197 Diarrhea, unspecified: Secondary | ICD-10-CM

## 2015-10-08 LAB — COMPREHENSIVE METABOLIC PANEL
ALT: 17 U/L (ref 0–35)
AST: 17 U/L (ref 0–37)
Albumin: 3.6 g/dL (ref 3.5–5.2)
Alkaline Phosphatase: 65 U/L (ref 39–117)
BILIRUBIN TOTAL: 0.9 mg/dL (ref 0.2–1.2)
BUN: 13 mg/dL (ref 6–23)
CO2: 30 meq/L (ref 19–32)
Calcium: 8.8 mg/dL (ref 8.4–10.5)
Chloride: 105 mEq/L (ref 96–112)
Creatinine, Ser: 0.79 mg/dL (ref 0.40–1.20)
GFR: 75.13 mL/min (ref >60.00)
GLUCOSE: 150 mg/dL — AB (ref 70–99)
POTASSIUM: 3.3 meq/L — AB (ref 3.5–5.1)
SODIUM: 142 meq/L (ref 135–145)
Total Protein: 6.3 g/dL (ref 6.0–8.3)

## 2015-10-08 LAB — CBC WITH DIFFERENTIAL/PLATELET
BASOS ABS: 0.1 10*3/uL (ref 0.0–0.1)
Basophils Relative: 0.5 % (ref 0.0–3.0)
EOS PCT: 1.3 % (ref 0.0–5.0)
Eosinophils Absolute: 0.1 10*3/uL (ref 0.0–0.7)
HCT: 40.6 % (ref 36.0–46.0)
Hemoglobin: 13.4 g/dL (ref 12.0–15.0)
LYMPHS ABS: 4.1 10*3/uL — AB (ref 0.7–4.0)
LYMPHS PCT: 35.4 % (ref 12.0–46.0)
MCHC: 33 g/dL (ref 30.0–36.0)
MCV: 88.6 fl (ref 78.0–100.0)
MONO ABS: 0.9 10*3/uL (ref 0.1–1.0)
MONOS PCT: 8.1 % (ref 3.0–12.0)
NEUTROS ABS: 6.3 10*3/uL (ref 1.4–7.7)
NEUTROS PCT: 54.7 % (ref 43.0–77.0)
PLATELETS: 304 10*3/uL (ref 150.0–400.0)
RBC: 4.58 Mil/uL (ref 3.87–5.11)
RDW: 13.2 % (ref 11.5–15.5)
WBC: 11.5 10*3/uL — AB (ref 4.0–10.5)

## 2015-10-08 LAB — SEDIMENTATION RATE: Sed Rate: 15 mm/hr (ref 0–30)

## 2015-10-08 MED ORDER — METRONIDAZOLE 250 MG PO TABS: 250.0000 mg | ORAL_TABLET | Freq: Three times a day (TID) | ORAL | Status: DC

## 2015-10-08 NOTE — Progress Notes (Signed)
Review of pertinent gastrointestinal problems: 1. Personal history of precancerous colon polyps; Colonoscopy 2008, several small TAs, was recommended to have repeat colonoscopy in 3 years.  Colonoscopy 11/2014 Dr. Ardis Hughs found three subCM polyps; one was TA, two were SSPolyps; she was recommended to have recall colonoscopy at 3 year interval.    HPI: This is a   very pleasant 75 year old woman  who was referred to me by Crist Infante, MD  to evaluate  diarrhea, abdominal pain  Chief complaint is diarrhea, abdominal pain  Stool testing at PCP office last week: C diff toxin negative, ova and parasites negative  Many years ago she starting having abd pains, severe, followed by diarrhea (about 1-2 times per year).  Has been going on 2-3 time per day for 2 months now.  No fevers or chills.  No rectal bleeding.    She was on antibiotics this past January, February for some reason and then also put on antibiotics for a cat bite 2-3 months ago (after bowel issues started).  Her weight has been stable for many months, years.  Review of systems: Pertinent positive and negative review of systems were noted in the above HPI section. Complete review of systems was performed and was otherwise normal.   Past Medical History  Diagnosis Date  . Coronary artery disease   . Angina   . Hypertension   . Dysrhythmia   . Shortness of breath   . Recurrent upper respiratory infection (URI)   . Hypothyroidism   . GERD (gastroesophageal reflux disease)   . Anxiety   . Depression   . Addison's disease ([place])   . Lumbar radiculopathy 04/27/2014  . 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  last knee surgery dr. Elmyra Ricks gave large dose of hydrocortisal and avoided the hypotensive side effectas of addison's disease.  [name] PONV (postoperative nausea and vomiting)   . Hyperlipemia   . Arthritis     "everywhere"  . Chronic lower back pain   . Atrial fibrillation [facility])      Past Surgical History  Procedure Laterality Date  . Back surgery    . Tonsillectomy    . Diagnostic laparoscopy    . Dilation and curettage of uterus    . Joint replacement    . Laparoscopic cholecystectomy    . Carpal tunnel release Right   . Knee arthroscopy Bilateral   . Total knee arthroplasty Bilateral   . Revision total knee arthroplasty Right   . Anterior lumbar fusion  1980    L4-5  . Cataract extraction, bilateral Bilateral   . Cardiac catheterization  2013  . Coronary artery bypass graft  06/26/2011    Procedure: CORONARY ARTERY BYPASS GRAFTING (CABG);  Surgeon: Melrose Nakayama, MD;  Location: [place];  Service: Open Heart Surgery;  Laterality: N/A;  times using Greater Saphenous Vein Graft harvested endoscopically from right leg  . Cardiac catheterization N/A 09/02/2014    Procedure: Left Heart Cath and Coronary Angiography;  Surgeon: Wellington Hampshire, MD;  Location: [place] [facility];  Service: Cardiovascular;  Laterality: N/A;  . Achilles tendon surgery  07/16/14    left ankle, bone spur removed  . Nerve stimulator of back  12/10/15    Current Outpatient Prescriptions  Medication Sig Dispense Refill  . acetaminophen (TYLENOL) 500 MG tablet Take 500 mg by mouth daily as needed for headache.    [name] amiodarone (PACERONE) 200 MG tablet Take 1 tablet twice  daily. Starting the day of your procedure reduce to 1 tablet daily 60 tablet 5  . amLODipine (NORVASC) 5 MG tablet Take 1 tablet (5 mg total) by mouth daily. 30 tablet 6  . apixaban (ELIQUIS) 5 MG TABS tablet Take 1 tablet (5 mg total) by mouth 2 (two) times daily. 60 tablet 11  . aspirin EC 81 MG tablet Take 81 mg by mouth every morning.    . clonazePAM (KLONOPIN) 0.5 MG tablet Take 0.5-1 tablets (0.25-0.5 mg total) by mouth 2 (two) times daily as needed for anxiety. 60 tablet 3  . CYANOCOBALAMIN IJ Inject as directed every 30 (thirty) days.    [name] FLUoxetine (PROZAC) 20 MG capsule Take 20 mg by mouth every morning.    .  furosemide (LASIX) 20 MG tablet Take 20 mg by mouth as needed for fluid or edema.    . hydrocortisone (CORTEF) 20 MG tablet Take 20 mg by mouth 2 (two) times daily.    . hyoscyamine (LEVSIN SL) 0.125 MG SL tablet Take 0.125 mg by mouth daily.     . irbesartan (AVAPRO) 300 MG tablet Take 300 mg by mouth daily.     [name] levothyroxine (SYNTHROID, LEVOTHROID) 75 MCG tablet Take 75 mcg by mouth daily.    . nebivolol (BYSTOLIC) 5 MG tablet Take 2.5 mg by mouth at bedtime. Takes 2.5 mg daily    . nitroGLYCERIN (NITROSTAT) 0.4 MG SL tablet Place 0.4 mg under the tongue every 5 (five) minutes as needed for chest pain (3 DOSES MAX FOR CHEST PAIN).    [name] omeprazole (PRILOSEC) 20 MG capsule Take 20 mg by mouth every morning.     . Probiotic Product (PROBIOTIC FORMULA PO) Take 1 capsule by mouth every morning. For upset stomach    . Vitamin D, Ergocalciferol, (DRISDOL) 50000 UNITS CAPS Take 50,000 Units by mouth 2 (two) times a week. Takes on Tuesdays and Fridays     No current facility-administered medications for this visit.    Allergies as of 10/08/2015 - Review Complete 10/08/2015  Allergen Reaction Noted  . Zanaflex [tizanidine] Other (See Comments) 01/01/2015  . Percocet [oxycodone-acetaminophen] Other (See Comments) 06/23/2011  . Sulfa antibiotics Itching 06/23/2011  . Sulfacetamide sodium Itching 01/01/2015    Family History  Problem Relation Age of Onset  . Heart attack Father   . Hypertension Mother   . Stroke Mother   . Hypertension Maternal Grandfather   . Colon cancer Neg Hx     Social History   Social History  . Marital Status: Widowed    Spouse Name: N/A  . Number of Children: N/A  . Years of Education: N/A   Occupational History  . Retired    Social History Main Topics  . Smoking status: Passive Smoke Exposure - Never Smoker -- 0.00 packs/day for 0 years  . Smokeless tobacco: Never Used  . Alcohol Use: No  . Drug Use: No  . Sexual Activity: No   Other Topics Concern   . Not on file   Social History Narrative     Physical Exam: Ht 5\' 4"  (1.626 m)  Wt 199 lb (90.266 kg)  BMI 34.14 kg/m2 Constitutional: generally well-appearing Psychiatric: alert and oriented x3 Eyes: extraocular movements intact Mouth: oral pharynx moist, no lesions Neck: supple no lymphadenopathy Cardiovascular: heart regular rate and rhythm Lungs: clear to auscultation bilaterally Abdomen: soft, nontender, nondistended, no obvious ascites, no peritoneal signs, normal bowel sounds Extremities: no lower extremity edema bilaterally Skin: no lesions on visible extremities  Assessment and plan: 75 y.o. female with  2 months of watery diarrhea, intermittent abdominal pain  I'm suspicious that she has underlying infection. Stool studies including C. difficile toxin and ova and parasites by her primary care physician's office recently were negative but I'm going to repeat those as well as add some other stool testing and blood work including CBC, complete metabolic profile, sedimentation rate. Going to start her empirically on Flagyl 250 mg one pill 3 times daily for 2 week course for now. I also instructed her to start taking Imodium one pill every morning shortly after waking for now.   Owens Loffler, MD [facility] 10/08/2015, 10:58 AM  Cc: Crist Infante, MD

## 2015-10-08 NOTE — Patient Instructions (Addendum)
You will have labs checked today in the basement lab.  Please head down after you check out with the front desk  (cbc, cmet, esr, stool for C. Diff by PCR and by Toxin, routine stool culture, stool for ova and parasites). For now start taking a single imodium pill once daily. Empiric antibiotics (flagyl 25m pill, take one pill three times daily; 2 week prescription).

## 2015-10-11 ENCOUNTER — Telehealth: Payer: Self-pay | Admitting: Interventional Cardiology

## 2015-10-11 NOTE — Telephone Encounter (Signed)
Mrs. [name] is calling because her GI doctor prescribe her Metronidzole 250 mg  and is wanting to know should she take this medication? She is schedule to have a procedure on the 21st with [name] . Please call    Thanks

## 2015-10-11 NOTE — Telephone Encounter (Signed)
Returned pt call. Adv her that I have spoken with our pharmacist [facility] it is ok for her to take metroNIDAZOLE along with her existing cardiac medications. Pt voiced appreciation for the cal back

## 2015-10-20 ENCOUNTER — Telehealth: Payer: Self-pay | Admitting: Interventional Cardiology

## 2015-10-20 NOTE — Telephone Encounter (Signed)
New message   Request for surgical clearance:  1. What type of surgery is being performed? Shock heart back into rhythm   2. When is this surgery scheduled? 7.21.2017   3. Are there any medications that need to be held prior to surgery and how long? No - patient would like hydrocodone  50 mg prior to procedure.  4. Name of physician performing surgery? Dr. Chalmers Cater  5. What is your office phone and fax number? [phone] Joylene Igo [phone]

## 2015-10-20 NOTE — Telephone Encounter (Signed)
Spoke with [name] a nurse at [facility] office after receiving the message below. Not sure what they are requesting, pain medication is not indicated for a dccv. [name] is unable to provide clarity. Adv her that I will contact the patient directly for more info.  Spoke with [name]. [name] sts that she has Addison disease. She has been told by Dr.Balan that she should always be given Hydrocortisone 50mg  prior to any procedure or surgery. [name] sts that her physicians have always administered an injection prior to any procedure or sx, [name] sts that she will need this prior to her dccv scheduled on 7/21. Adv [name] that Dr.smith is not in the office this afternoon. I will give him the message. I will give the [name] a call back if any issues. [name] agreeable with plan and verbalized understanding.

## 2015-10-20 NOTE — Telephone Encounter (Signed)
I dont thjnk this is needed but she should clear with Dr. Chalmers Cater,.

## 2015-10-21 ENCOUNTER — Other Ambulatory Visit: Payer: Self-pay | Admitting: Gastroenterology

## 2015-10-21 ENCOUNTER — Other Ambulatory Visit (INDEPENDENT_AMBULATORY_CARE_PROVIDER_SITE_OTHER): Payer: Medicare Other | Admitting: *Deleted

## 2015-10-21 ENCOUNTER — Ambulatory Visit (INDEPENDENT_AMBULATORY_CARE_PROVIDER_SITE_OTHER): Payer: Medicare Other | Admitting: Interventional Cardiology

## 2015-10-21 ENCOUNTER — Encounter: Payer: Self-pay | Admitting: Interventional Cardiology

## 2015-10-21 ENCOUNTER — Other Ambulatory Visit: Payer: Medicare Other

## 2015-10-21 ENCOUNTER — Other Ambulatory Visit: Payer: Self-pay | Admitting: Interventional Cardiology

## 2015-10-21 DIAGNOSIS — I4819 Other persistent atrial fibrillation: Secondary | ICD-10-CM

## 2015-10-21 DIAGNOSIS — I4891 Unspecified atrial fibrillation: Secondary | ICD-10-CM | POA: Diagnosis not present

## 2015-10-21 DIAGNOSIS — I481 Persistent atrial fibrillation: Secondary | ICD-10-CM

## 2015-10-21 DIAGNOSIS — R197 Diarrhea, unspecified: Secondary | ICD-10-CM

## 2015-10-21 DIAGNOSIS — E271 Primary adrenocortical insufficiency: Secondary | ICD-10-CM

## 2015-10-21 LAB — BASIC METABOLIC PANEL
BUN: 16 mg/dL (ref 7–25)
CHLORIDE: 103 mmol/L (ref 98–110)
CO2: 26 mmol/L (ref 20–31)
CREATININE: 0.87 mg/dL (ref 0.60–0.93)
Calcium: 8.5 mg/dL — ABNORMAL LOW (ref 8.6–10.4)
Glucose, Bld: 154 mg/dL — ABNORMAL HIGH (ref 65–99)
POTASSIUM: 3.4 mmol/L — AB (ref 3.5–5.3)
Sodium: 140 mmol/L (ref 135–146)

## 2015-10-21 NOTE — Telephone Encounter (Signed)
Dr.Smith has spoken with Dr.Balan directly and will write the request int to the pt's dccv orders

## 2015-10-21 NOTE — Progress Notes (Signed)
1.) Reason for visit: EKG  2.) Name of MD requesting visit: DR  SMITH  3.) H&P:A FIB     4.) ROS related to problem: DIZZINESS   5.) Assessment and plan per MD:  PRE PROCEDURE  EKG  PT  SCHEDULED FOR  DCCV  TOM PER DR  SMITH PROCEED  AS PLANNED

## 2015-10-21 NOTE — Addendum Note (Signed)
Addended by: Devra Dopp E on: 10/21/2015 11:01 AM   Modules accepted: Level of Service

## 2015-10-22 ENCOUNTER — Ambulatory Visit (HOSPITAL_COMMUNITY): Payer: Medicare Other | Admitting: Anesthesiology

## 2015-10-22 ENCOUNTER — Encounter (HOSPITAL_COMMUNITY): Payer: Self-pay | Admitting: *Deleted

## 2015-10-22 ENCOUNTER — Ambulatory Visit (HOSPITAL_COMMUNITY)
Admission: RE | Admit: 2015-10-22 | Discharge: 2015-10-22 | Disposition: A | Discharge status: Home / Self Care | Payer: Medicare Other | Source: Ambulatory Visit | Attending: Cardiovascular Disease | Admitting: Cardiovascular Disease

## 2015-10-22 ENCOUNTER — Encounter (HOSPITAL_COMMUNITY)
Admission: RE | Disposition: A | Discharge status: Home / Self Care | Payer: Self-pay | Source: Ambulatory Visit | Attending: Cardiovascular Disease

## 2015-10-22 DIAGNOSIS — I481 Persistent atrial fibrillation: Secondary | ICD-10-CM | POA: Diagnosis not present

## 2015-10-22 DIAGNOSIS — E271 Primary adrenocortical insufficiency: Secondary | ICD-10-CM | POA: Diagnosis not present

## 2015-10-22 DIAGNOSIS — Z951 Presence of aortocoronary bypass graft: Secondary | ICD-10-CM | POA: Diagnosis not present

## 2015-10-22 DIAGNOSIS — E785 Hyperlipidemia, unspecified: Secondary | ICD-10-CM | POA: Diagnosis not present

## 2015-10-22 DIAGNOSIS — Z7901 Long term (current) use of anticoagulants: Secondary | ICD-10-CM | POA: Diagnosis not present

## 2015-10-22 DIAGNOSIS — Z96653 Presence of artificial knee joint, bilateral: Secondary | ICD-10-CM | POA: Diagnosis not present

## 2015-10-22 DIAGNOSIS — I2510 Atherosclerotic heart disease of native coronary artery without angina pectoris: Secondary | ICD-10-CM | POA: Insufficient documentation

## 2015-10-22 DIAGNOSIS — Z8249 Family history of ischemic heart disease and other diseases of the circulatory system: Secondary | ICD-10-CM | POA: Diagnosis not present

## 2015-10-22 DIAGNOSIS — Z7982 Long term (current) use of aspirin: Secondary | ICD-10-CM | POA: Diagnosis not present

## 2015-10-22 DIAGNOSIS — I110 Hypertensive heart disease with heart failure: Secondary | ICD-10-CM | POA: Insufficient documentation

## 2015-10-22 DIAGNOSIS — I4891 Unspecified atrial fibrillation: Secondary | ICD-10-CM | POA: Diagnosis present

## 2015-10-22 DIAGNOSIS — I4819 Other persistent atrial fibrillation: Secondary | ICD-10-CM | POA: Insufficient documentation

## 2015-10-22 DIAGNOSIS — I5032 Chronic diastolic (congestive) heart failure: Secondary | ICD-10-CM | POA: Diagnosis not present

## 2015-10-22 DIAGNOSIS — Z7722 Contact with and (suspected) exposure to environmental tobacco smoke (acute) (chronic): Secondary | ICD-10-CM | POA: Insufficient documentation

## 2015-10-22 HISTORY — PX: CARDIOVERSION: SHX1299

## 2015-10-22 LAB — C. DIFFICILE GDH AND TOXIN A/B
C. DIFF TOXIN A/B: NOT DETECTED
C. difficile GDH: NOT DETECTED

## 2015-10-22 LAB — CLOSTRIDIUM DIFFICILE BY PCR: CDIFFPCR: NOT DETECTED

## 2015-10-22 SURGERY — CARDIOVERSION
Anesthesia: Monitor Anesthesia Care

## 2015-10-22 MED ORDER — SODIUM CHLORIDE 0.9% FLUSH: 3.0000 mL | INTRAVENOUS | Status: DC | PRN

## 2015-10-22 MED ORDER — PROPOFOL 10 MG/ML IV BOLUS
INTRAVENOUS | Status: DC | PRN
  Administered 2015-10-22: 50 mg via INTRAVENOUS

## 2015-10-22 MED ORDER — SODIUM CHLORIDE 0.9 % IV SOLN: 250.0000 mL | INTRAVENOUS | Status: DC

## 2015-10-22 MED ORDER — SODIUM CHLORIDE 0.9% FLUSH: 3.0000 mL | Freq: Two times a day (BID) | INTRAVENOUS | Status: DC

## 2015-10-22 MED ORDER — HYDROCORTISONE NA SUCCINATE PF 100 MG IJ SOLR
50.0000 mg | Freq: Once | INTRAMUSCULAR | Status: AC
  Administered 2015-10-22: 50 mg via INTRAVENOUS
  Filled 2015-10-22: qty 1

## 2015-10-22 MED ORDER — LIDOCAINE HCL (CARDIAC) 20 MG/ML IV SOLN
INTRAVENOUS | Status: DC | PRN
  Administered 2015-10-22: 3 mL via INTRATRACHEAL

## 2015-10-22 NOTE — Interval H&P Note (Signed)
History and Physical Interval Note:  10/22/2015 1:02 PM  [name]  has presented today for surgery, with the diagnosis of afib  The various methods of treatment have been discussed with the patient and family. After consideration of risks, benefits and other options for treatment, the patient has consented to  Procedure(s): CARDIOVERSION (N/A) as a surgical intervention .  The patient's history has been reviewed, patient examined, no change in status, stable for surgery.  I have reviewed the patient's chart and labs.  Questions were answered to the patient's satisfaction.     [name]

## 2015-10-22 NOTE — Interval H&P Note (Signed)
History and Physical Interval Note:  10/22/2015 1:03 PM  [name]  has presented today for surgery, with the diagnosis of afib  The various methods of treatment have been discussed with the patient and family. After consideration of risks, benefits and other options for treatment, the patient has consented to  Procedure(s): CARDIOVERSION (N/A) as a surgical intervention .  The patient's history has been reviewed, patient examined, no change in status, stable for surgery.  I have reviewed the patient's chart and labs.  Questions were answered to the patient's satisfaction.     [name]

## 2015-10-22 NOTE — Discharge Instructions (Signed)
Electrical Cardioversion, Care After °Refer to this sheet in the next few weeks. These instructions provide you with information on caring for yourself after your procedure. Your health care provider may also give you more specific instructions. Your treatment has been planned according to current medical practices, but problems sometimes occur. Call your health care provider if you have any problems or questions after your procedure. °WHAT TO EXPECT AFTER THE PROCEDURE °After your procedure, it is typical to have the following sensations: °· Some redness on the skin where the shocks were delivered. If this is tender, a sunburn lotion or hydrocortisone cream may help. °· Possible return of an abnormal heart rhythm within hours or days after the procedure. °HOME CARE INSTRUCTIONS °· Take medicines only as directed by your health care provider. Be sure you understand how and when to take your medicine. °· Learn how to feel your pulse and check it often. °· Limit your activity for 48 hours after the procedure or as directed by your health care provider. °· Avoid or minimize caffeine and other stimulants as directed by your health care provider. °SEEK MEDICAL CARE IF: °· You feel like your heart is beating too fast or your pulse is not regular. °· You have any questions about your medicines. °· You have bleeding that will not stop. °SEEK IMMEDIATE MEDICAL CARE IF: °· You are dizzy or feel faint. °· It is hard to breathe or you feel short of breath. °· There is a change in discomfort in your chest. °· Your speech is slurred or you have trouble moving an arm or leg on one side of your body. °· You get a serious muscle cramp that does not go away. °· Your fingers or toes turn cold or blue. °  °This information is not intended to replace advice given to you by your health care provider. Make sure you discuss any questions you have with your health care provider. °  °Document Released: 01/08/2013 Document Revised: 04/10/2014  Document Reviewed: 01/08/2013 °Elsevier Interactive Patient Education ©2016 [employer] ° °

## 2015-10-22 NOTE — Transfer of Care (Signed)
Immediate Anesthesia Transfer of Care Note  Patient: [name]  Procedure(s) Performed: Procedure(s): CARDIOVERSION (N/A)  Patient Location: PACU  Anesthesia Type:MAC  Level of Consciousness: awake and alert   Airway & Oxygen Therapy: Patient Spontanous Breathing and Patient connected to nasal cannula oxygen  Post-op Assessment: Report given to RN, Post -op Vital signs reviewed and stable and Patient moving all extremities  Post vital signs: Reviewed and stable  Last Vitals:  Filed Vitals:   10/22/15 1310 10/22/15 1320  BP: 117/58 139/53  Pulse: 29 30  Temp:    Resp: 12 15    Last Pain: There were no vitals filed for this visit.       Complications: No apparent anesthesia complications

## 2015-10-22 NOTE — Anesthesia Postprocedure Evaluation (Signed)
Anesthesia Post Note  Patient: [name]  Procedure(s) Performed: Procedure(s) (LRB): CARDIOVERSION (N/A)  Patient location during evaluation: Endoscopy Anesthesia Type: MAC Level of consciousness: awake and alert Pain management: pain level controlled Vital Signs Assessment: post-procedure vital signs reviewed and stable Respiratory status: spontaneous breathing, nonlabored ventilation, respiratory function stable and patient connected to nasal cannula oxygen Cardiovascular status: stable and blood pressure returned to baseline Anesthetic complications: no    Last Vitals:  Filed Vitals:   10/22/15 1310 10/22/15 1320  BP: 117/58 139/53  Pulse: 29 30  Temp:    Resp: 12 15    Last Pain: There were no vitals filed for this visit.               Catalina Gravel

## 2015-10-22 NOTE — H&P (View-Only) (Signed)
Cardiology Office Note    Date:  10/06/2015   ID:  [name], [name] December 30, 1940, MRN [id number]  PCP:  Jerlyn Ly, MD  Cardiologist: Sinclair Grooms, MD   Chief Complaint  Patient presents with  . Follow-up    History of Present Illness:  [name] is a 75 y.o. female for f/u of atrial fibrillation, coronary artery disease with bypass surgery including LIMA to LAD, SVG to OM, and SVG to RCA 2013, hypertension, hyperlipidemia, history of intermittent shortness of breath, and chronic diastolic heart failure.  Recently, amlodipine was resumed due to elevated BP. She has not felt well due to AF causing dyspnea.After resuming amlodipine, she has begun to feel better. She does get dizziness shortly after taking the a.m. dose of medications which includes Avapro, amlodipine, and Bystolic. She denies palpitations. Since starting Eliquis one month ago, there is been no bleeding complications.  Past Medical History  Diagnosis Date  . Coronary artery disease   . Angina   . Hypertension   . Dysrhythmia   . Shortness of breath   . Recurrent upper respiratory infection (URI)   . Hypothyroidism   . GERD (gastroesophageal reflux disease)   . Anxiety   . Depression   . Addison's disease ([place])   . Lumbar radiculopathy 04/27/2014  . 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  last knee surgery dr. Elmyra Ricks gave large dose of hydrocortisal and avoided the hypotensive side effectas of addison's disease.  [name] PONV (postoperative nausea and vomiting)   . Hyperlipemia   . Arthritis     "everywhere"  . Chronic lower back pain     Past Surgical History  Procedure Laterality Date  . Back surgery    . Tonsillectomy    . Diagnostic laparoscopy    . Dilation and curettage of uterus    . Joint replacement    . Laparoscopic cholecystectomy    . Carpal tunnel release Right   . Knee arthroscopy Bilateral   . Total knee arthroplasty Bilateral    . Revision total knee arthroplasty Right   . Anterior lumbar fusion  1980    L4-5  . Cataract extraction, bilateral Bilateral   . Cardiac catheterization  2013  . Coronary artery bypass graft  06/26/2011    Procedure: CORONARY ARTERY BYPASS GRAFTING (CABG);  Surgeon: Melrose Nakayama, MD;  Location: [facility];  Service: Open Heart Surgery;  Laterality: N/A;  times using Greater Saphenous Vein Graft harvested endoscopically from right leg  . Cardiac catheterization N/A 09/02/2014    Procedure: Left Heart Cath and Coronary Angiography;  Surgeon: Wellington Hampshire, MD;  Location: [facility];  Service: Cardiovascular;  Laterality: N/A;  . Achilles tendon surgery  07/16/14    left ankle, bone spur removed  . Nerve stimulator of back  12/10/15    Current Medications: Outpatient Prescriptions Prior to Visit  Medication Sig Dispense Refill  . acetaminophen (TYLENOL) 500 MG tablet Take 500 mg by mouth daily as needed for headache.    [name] amLODipine (NORVASC) 5 MG tablet Take 1 tablet (5 mg total) by mouth daily. 30 tablet 6  . apixaban (ELIQUIS) 5 MG TABS tablet Take 1 tablet (5 mg total) by mouth 2 (two) times daily. 60 tablet 11  . aspirin EC 81 MG tablet Take 81 mg by mouth every morning.    . clonazePAM (KLONOPIN) 0.5 MG tablet Take 0.5-1 tablets (0.25-0.5 mg total)  by mouth 2 (two) times daily as needed for anxiety. 60 tablet 3  . CYANOCOBALAMIN IJ Inject as directed every 30 (thirty) days.    [name] FLUoxetine (PROZAC) 20 MG capsule Take 20 mg by mouth every morning.    . furosemide (LASIX) 20 MG tablet Take 20 mg by mouth as needed for fluid or edema.    . hydrocortisone (CORTEF) 20 MG tablet Take 20 mg by mouth 2 (two) times daily.    . hyoscyamine (LEVSIN SL) 0.125 MG SL tablet Take 0.125 mg by mouth daily.     . irbesartan (AVAPRO) 300 MG tablet Take 300 mg by mouth daily.     [name] levothyroxine (SYNTHROID, LEVOTHROID) 75 MCG tablet Take 75 mcg by mouth daily.    . nebivolol (BYSTOLIC) 5 MG  tablet Take 2.5 mg by mouth at bedtime. Takes 2.5 mg daily    . nitroGLYCERIN (NITROSTAT) 0.4 MG SL tablet Place 0.4 mg under the tongue every 5 (five) minutes as needed for chest pain (3 DOSES MAX FOR CHEST PAIN).    [name] omeprazole (PRILOSEC) 20 MG capsule Take 20 mg by mouth every morning.     . Probiotic Product (PROBIOTIC FORMULA PO) Take 1 capsule by mouth every morning. For upset stomach    . Vitamin D, Ergocalciferol, (DRISDOL) 50000 UNITS CAPS Take 50,000 Units by mouth 2 (two) times a week. Takes on Tuesdays and Fridays     No facility-administered medications prior to visit.     Allergies:   Tizanidine; Percocet; Sulfa antibiotics; and Sulfacetamide sodium   Social History   Social History  . Marital Status: Widowed    Spouse Name: N/A  . Number of Children: N/A  . Years of Education: N/A   Occupational History  . Retired    Social History Main Topics  . Smoking status: Passive Smoke Exposure - Never Smoker -- 0.00 packs/day for 0 years  . Smokeless tobacco: Never Used  . Alcohol Use: No  . Drug Use: No  . Sexual Activity: No   Other Topics Concern  . None   Social History Narrative     Family History:  The patient's family history includes Heart attack in her father; Hypertension in her maternal grandfather and mother; Stroke in her mother. There is no history of Colon cancer.   ROS:   Please see the history of present illness.    Abdominal pain, diarrhea, dizziness, easy bruising.  All other systems reviewed and are negative.   PHYSICAL EXAM:   VS:  BP 150/80 mmHg  Pulse 77  Ht 5' 4.5" (1.638 m)  Wt 197 lb 12.8 oz (89.721 kg)  BMI 33.44 kg/m2   GEN: Well nourished, well developed, in no acute distress HEENT: normal Neck: no JVD, carotid bruits, or masses Cardiac: IIRR; no murmurs, rubs, or gallops,no edema  Respiratory:  clear to auscultation bilaterally, normal work of breathing GI: soft, nontender, nondistended, + BS MS: no deformity or  atrophy Skin: warm and dry, no rash Neuro:  Alert and Oriented x 3, Strength and sensation are intact Psych: euthymic mood, full affect  Wt Readings from Last 3 Encounters:  10/06/15 197 lb 12.8 oz (89.721 kg)  09/24/15 198 lb 6.4 oz (89.994 kg)  09/15/15 199 lb 6.4 oz (90.447 kg)      Studies/Labs Reviewed:   EKG:  EKG  Atrial fibrillation with controlled rate of 77 bpm.  Recent Labs: 09/24/2015: BUN 13; Creat 0.78; Potassium 4.1; Sodium 141   Lipid Panel  Component Value Date/Time   CHOL 100 09/02/2014 0346   TRIG 78 09/02/2014 0346   HDL 40* 09/02/2014 0346   CHOLHDL 2.5 09/02/2014 0346   VLDL 16 09/02/2014 0346   LDLCALC 44 09/02/2014 0346    Additional studies/ records that were reviewed today include:  Holter monitor performed on 09/20/15 revealed persistent atrial fibrillation with rate control.  ECHOCARDIOGRAM: 09/20/15  Study Conclusions  - Left ventricle: The cavity size was normal. Wall thickness was  increased in a pattern of moderate LVH. Systolic function was  vigorous. The estimated ejection fraction was in the range of 65%  to 70%. - Mitral valve: There was mild regurgitation. - Pulmonary arteries: PA peak pressure: 35 mm Hg (S). -Normal left atrial size  ASSESSMENT:    1. Persistent atrial fibrillation ([place])   2. Essential hypertension   3. Addison disease ([place])   4. S/P CABG x 3 2013- patent grafts 09/03/14   5. Hyperlipidemia with target LDL less than 70      PLAN:  In order of problems listed above:  1. The patient has persistent atrial fibrillation. With rate control, she has begun to feel better. Dyspnea is not as severe. There is no chest discomfort. She has not had syncope. Energy levels have markedly improved.              We will plan to start the patient on amiodarone 200 mg twice a day. She needs to continue this       therapy for at least 2 weeks prior to cardioversion. Cardioversion will be set for 2 weeks from now.  Amiodarone  dose should be decreased after successful cardioversion to 200 mg per day. The nature of the procedure including risks of CVA, and mechanical injury were discussed in detail and accepted by the patient. 2. BP control is variable. Relatively high today but she has not yet had her a.m. medications. We may have to further intensified antihypertensive therapy depending upon blood pressure recordings. 3. No bleeding complications on anticoagulation therapy. This therapy will be continued as listed. 4. Amiodarone 200 mg twice a day is started today. She will need at least 2 weeks of twice a day therapy prior to cardioversion. At cardioversion we will decrease amiodarone to 200 mg daily.Bystolic will likely be discontinued.    Medication Adjustments/Labs and Tests Ordered: Current medicines are reviewed at length with the patient today.  Concerns regarding medicines are outlined above.  Medication changes, Labs and Tests ordered today are listed in the Patient Instructions below. Patient Instructions  Medication Instructions:  Your physician has recommended you make the following change in your medication:  1) START Amiodarone 200mg  twice daily. Starting the day of your procedure reduce to 200mg  once daily. An Rx has been sent to your pharmacy.  2)Take your last dose of Bystolic on 0000000 the day before your procedure   Labwork: Your physician recommends that you return for lab work on 10/21/15 (bmet)   Testing/Procedures: Your physician has recommended that you have a Cardioversion (DCCV). Electrical Cardioversion uses a jolt of electricity to your heart either through paddles or wired patches attached to your chest. This is a controlled, usually prescheduled, procedure. Defibrillation is done under light anesthesia in the hospital, and you usually go home the day of the procedure. This is done to get your heart back into a normal rhythm. You are not awake for the procedure. Please see the instruction  sheet given to you today. (Scheduled on 10/22/15 @  1 pm) please follow the instructions on your instruction letter   Follow-Up: You have a EKG nurse visit scheduled on 10/21/15 @11am   You have an appointment scheduled with Dr.Zakiah Beckerman on 11/09/15 @ 11:30am  Any Other Special Instructions Will Be Listed Below (If Applicable).     If you need a refill on your cardiac medications before your next appointment, please call your pharmacy.       Signed, Sinclair Grooms, MD  10/06/2015 11:10 AM    [facility] [address], [place], [place]  [address] Phone: [phone]; Fax: [phone]

## 2015-10-22 NOTE — Op Note (Signed)
Procedure: Electrical Cardioversion Indications:  Atrial Fibrillation  Procedure Details:  Consent: Risks of procedure as well as the alternatives and risks of each were explained to the (patient/caregiver).  Consent for procedure obtained.  Time Out: Verified patient identification, verified procedure, site/side was marked, verified correct patient position, special equipment/implants available, medications/allergies/relevent history reviewed, required imaging and test results available.  Performed  Patient placed on cardiac monitor, pulse oximetry, supplemental oxygen as necessary.  Sedation given: IV propofol, Dr. Gifford Shave Pacer pads placed anterior and posterior chest.  Cardioverted 1 time(s).  Cardioversion with synchronized biphasic 120J shock.  Evaluation: Findings: Post procedure EKG shows: NSR with atrial bigeminy Complications: None Patient did tolerate procedure well.  Time Spent Directly with the Patient:  30 minutes   Neshia Mckenzie 10/22/2015, 12:57 PM

## 2015-10-22 NOTE — Anesthesia Preprocedure Evaluation (Addendum)
Anesthesia Evaluation  Patient identified by MRN, date of birth, ID band Patient awake    Reviewed: Allergy & Precautions, NPO status , Patient's Chart, lab work & pertinent test results  History of Anesthesia Complications (+) PONV and history of anesthetic complications  Airway Mallampati: II  TM Distance: >3 FB Neck ROM: Full    Dental  (+) Teeth Intact, Dental Advisory Given   Pulmonary sleep apnea ,    Pulmonary exam normal breath sounds clear to auscultation       Cardiovascular hypertension, Pt. on medications (-) angina+ CAD, + CABG and + DOE  + dysrhythmias Atrial Fibrillation  Rhythm:Irregular Rate:Abnormal  Echo 09/20/15: Study Conclusions  - Left ventricle: The cavity size was normal. Wall thickness was increased in a pattern of moderate LVH. Systolic function was vigorous. The estimated ejection fraction was in the range of 65% to 70%. - Mitral valve: There was mild regurgitation. - Pulmonary arteries: PA peak pressure: 35 mm Hg (S).   Neuro/Psych PSYCHIATRIC DISORDERS Anxiety Depression  Neuromuscular disease    GI/Hepatic Neg liver ROS, GERD  Medicated and Controlled,  Endo/Other  Hypothyroidism Addison's disease  Renal/GU negative Renal ROS     Musculoskeletal  (+) Arthritis ,   Abdominal   Peds  Hematology  (+) Blood dyscrasia, anemia , Obesity    Anesthesia Other Findings Day of surgery medications reviewed with the patient.  Reproductive/Obstetrics                           Anesthesia Physical Anesthesia Plan  ASA: III  Anesthesia Plan: MAC   Post-op Pain Management:    Induction: Intravenous  Airway Management Planned: Nasal Cannula  Additional Equipment:   Intra-op Plan:   Post-operative Plan:   Informed Consent: I have reviewed the patients History and Physical, chart, labs and discussed the procedure including the risks, benefits and alternatives for  the proposed anesthesia with the patient or authorized representative who has indicated his/her understanding and acceptance.   Dental advisory given  Plan Discussed with: CRNA and Anesthesiologist  Anesthesia Plan Comments: (Discussed risks/benefits/alternatives to MAC sedation including need for ventilatory support, hypotension, need for conversion to general anesthesia.  All patient questions answered.  Patient/guardian wishes to proceed.)        Anesthesia Quick Evaluation

## 2015-10-23 ENCOUNTER — Encounter (HOSPITAL_COMMUNITY): Payer: Self-pay | Admitting: Cardiovascular Disease

## 2015-10-25 LAB — STOOL CULTURE

## 2015-10-25 LAB — OVA AND PARASITE EXAMINATION: OP: NONE SEEN

## 2015-11-08 NOTE — [place] Notes (Signed)
Cardiology Office Note    Date:  11/09/2015   ID:  [name], [name] 02-Dec-1940, MRN [id number]  PCP:  Jerlyn Ly, MD  Cardiologist: Sinclair Grooms, MD   Chief Complaint  Patient presents with  . Atrial Fibrillation    History of Present Illness:  [name] is a 75 y.o. female follow-up of coronary artery disease with prior bypass grafting, atrial fibrillation resulting in acute diastolic heart failure, hypertension, chronic anticoagulation therapy withEliquis and hyperlipidemia.  Feels much better since the electrocardioversion which was performed 10/22/2015. Exertional tolerance has markedly improved. Dyspnea has resolved. She denies orthopnea and is no peripheral edema. Amiodarone is been decreased to 200 mg daily. She does not want to remain on amiodarone because it caused her husband have significant complications. We discussed the benefits of amiodarone with reference to maintaining sinus rhythm.  Past Medical History:  Diagnosis Date  . Addison's disease ([place])   . Angina   . Anxiety   . Arthritis    "everywhere"  . Atrial fibrillation ([place])   . Chronic lower back pain   . 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  last knee surgery dr. Elmyra Ricks gave large dose of hydrocortisal and avoided the hypotensive side effectas of addison's disease.  . Coronary artery disease   . Depression   . Dysrhythmia   . GERD (gastroesophageal reflux disease)   . Hyperlipemia   . Hypertension   . Hypothyroidism   . Lumbar radiculopathy 04/27/2014  . PONV (postoperative nausea and vomiting)   . Recurrent upper respiratory infection (URI)   . Shortness of breath     Past Surgical History:  Procedure Laterality Date  . ACHILLES TENDON SURGERY  07/16/14   left ankle, bone spur removed  . ANTERIOR LUMBAR FUSION  1980   L4-5  . BACK SURGERY    . CARDIAC CATHETERIZATION  2013  . CARDIAC CATHETERIZATION N/A 09/02/2014   Procedure:  Left Heart Cath and Coronary Angiography;  Surgeon: Wellington Hampshire, MD;  Location: [facility];  Service: Cardiovascular;  Laterality: N/A;  . CARDIOVERSION N/A 10/22/2015   Procedure: CARDIOVERSION;  Surgeon: Sanda Klein, MD;  Location: [facility];  Service: Cardiovascular;  Laterality: N/A;  . CARPAL TUNNEL RELEASE Right   . CATARACT EXTRACTION, BILATERAL Bilateral   . CORONARY ARTERY BYPASS GRAFT  06/26/2011   Procedure: CORONARY ARTERY BYPASS GRAFTING (CABG);  Surgeon: Melrose Nakayama, MD;  Location: [facility];  Service: Open Heart Surgery;  Laterality: N/A;  times using Greater Saphenous Vein Graft harvested endoscopically from right leg  . DIAGNOSTIC LAPAROSCOPY    . DILATION AND CURETTAGE OF UTERUS    . JOINT REPLACEMENT    . KNEE ARTHROSCOPY Bilateral   . LAPAROSCOPIC CHOLECYSTECTOMY    . Nerve stimulator of back  12/10/15  . REVISION TOTAL KNEE ARTHROPLASTY Right   . TONSILLECTOMY    . TOTAL KNEE ARTHROPLASTY Bilateral     Current Medications: Outpatient Medications Prior to Visit  Medication Sig Dispense Refill  . acetaminophen (TYLENOL) 500 MG tablet Take 500 mg by mouth daily as needed for headache.    [name] amLODipine (NORVASC) 5 MG tablet Take 1 tablet (5 mg total) by mouth daily. 30 tablet 6  . apixaban (ELIQUIS) 5 MG TABS tablet Take 1 tablet (5 mg total) by mouth 2 (two) times daily. 60 tablet 11  . aspirin EC 81 MG tablet Take 81 mg by mouth every  morning.    . clonazePAM (KLONOPIN) 0.5 MG tablet Take 0.5-1 tablets (0.25-0.5 mg total) by mouth 2 (two) times daily as needed for anxiety. 60 tablet 3  . CYANOCOBALAMIN IJ Inject as directed every 30 (thirty) days.    [name] FLUoxetine (PROZAC) 20 MG capsule Take 20 mg by mouth every morning.    . furosemide (LASIX) 20 MG tablet Take 20 mg by mouth as needed for fluid or edema.    . hydrocortisone (CORTEF) 20 MG tablet Take 20 mg by mouth 2 (two) times daily.    . hyoscyamine (LEVSIN SL) 0.125 MG SL tablet Take 0.125 mg  by mouth daily.     . irbesartan (AVAPRO) 300 MG tablet Take 300 mg by mouth daily.     [name] levothyroxine (SYNTHROID, LEVOTHROID) 88 MCG tablet Take 88 mcg by mouth daily before breakfast.    . metroNIDAZOLE (FLAGYL) 250 MG tablet Take 1 tablet (250 mg total) by mouth 3 (three) times daily. 42 tablet 0  . Multiple Vitamin (MULTIVITAMIN WITH MINERALS) TABS tablet Take 1 tablet by mouth daily.    . nebivolol (BYSTOLIC) 5 MG tablet Take 2.5 mg by mouth at bedtime. Takes 2.5 mg daily    . nitroGLYCERIN (NITROSTAT) 0.4 MG SL tablet Place 0.4 mg under the tongue every 5 (five) minutes as needed for chest pain (3 DOSES MAX FOR CHEST PAIN).    [name] omega-3 acid ethyl esters (LOVAZA) 1 g capsule Take 1 g by mouth daily.    [name] omeprazole (PRILOSEC) 20 MG capsule Take 20 mg by mouth every morning.     . Probiotic Product (PROBIOTIC FORMULA PO) Take 1 capsule by mouth every morning. For upset stomach    . Vitamin D, Ergocalciferol, (DRISDOL) 50000 UNITS CAPS Take 50,000 Units by mouth 2 (two) times a week. Takes on Tuesdays and Fridays    . amiodarone (PACERONE) 200 MG tablet Take 1 tablet twice daily. Starting the day of your procedure reduce to 1 tablet daily 60 tablet 5   No facility-administered medications prior to visit.      Allergies:   Zanaflex [tizanidine]; Percocet [oxycodone-acetaminophen]; Sulfa antibiotics; and Sulfacetamide sodium   Social History   Social History  . Marital status: Widowed    Spouse name: N/A  . Number of children: N/A  . Years of education: N/A   Occupational History  . Retired    Social History Main Topics  . Smoking status: Passive Smoke Exposure - Never Smoker    Packs/day: 0.00    Years: 0.00  . Smokeless tobacco: Never Used  . Alcohol use No  . Drug use: No  . Sexual activity: No   Other Topics Concern  . None   Social History Narrative  . None     Family History:  The patient's family history includes Heart attack in her father; Hypertension in her  maternal grandfather and mother; Stroke in her mother.   ROS:   Please see the history of present illness.    Easy bruising but otherwise no complaints.  All other systems reviewed and are negative.   PHYSICAL EXAM:   VS:  BP 122/78   Pulse 62   Ht 5' 4.5" (1.638 m)   Wt 200 lb 9.6 oz (91 kg)   BMI 33.90 kg/m    GEN: Well nourished, well developed, in no acute distress  HEENT: normal  Neck: no JVD, carotid bruits, or masses Cardiac: RRR; no murmurs, rubs, or gallops,no edema  Respiratory:  clear  to auscultation bilaterally, normal work of breathing GI: soft, nontender, nondistended, + BS MS: no deformity or atrophy  Skin: warm and dry, no rash Neuro:  Alert and Oriented x 3, Strength and sensation are intact Psych: euthymic mood, full affect  Wt Readings from Last 3 Encounters:  11/09/15 200 lb 9.6 oz (91 kg)  10/22/15 199 lb (90.3 kg)  10/08/15 199 lb (90.3 kg)      Studies/Labs Reviewed:   EKG:  EKG  Normal sinus rhythm with atrial bigeminy. Left atrial abnormality.  Recent Labs: 10/08/2015: ALT 17; Hemoglobin 13.4; Platelets 304.0 10/21/2015: BUN 16; Creat 0.87; Potassium 3.4; Sodium 140   Lipid Panel    Component Value Date/Time   CHOL 100 09/02/2014 0346   TRIG 78 09/02/2014 0346   HDL 40 (L) 09/02/2014 0346   CHOLHDL 2.5 09/02/2014 0346   VLDL 16 09/02/2014 0346   LDLCALC 44 09/02/2014 0346    Additional studies/ records that were reviewed today include:  No new data    ASSESSMENT:    1. Essential hypertension   2. Persistent atrial fibrillation ([place])   3. S/P CABG x 3 2013- patent grafts 09/03/14   4. Hyperlipidemia with target LDL less than 70   5. On amiodarone therapy   6. Chronic anticoagulation      PLAN:  In order of problems listed above:  1. Adequate control. Low salt diet encouraged. 2. Rhythm is controlled on amiodarone 200 mg daily. We will discontinue amiodarone at the end of October. She will have an office follow-up area today's  EKG demonstrates sinus rhythm with atrial bigeminy. 3. Asymptomatic 4. She does not want to stay on amiodarone because of her husband's prior experience with the medication. I encouraged her to stay on amiodarone for a total of 3-4 months and then we will discontinue the medication and see how she does. 5. Will need an definite anticoagulation therapy.    Medication Adjustments/Labs and Tests Ordered: Current medicines are reviewed at length with the patient today.  Concerns regarding medicines are outlined above.  Medication changes, Labs and Tests ordered today are listed in the Patient Instructions below. Patient Instructions  Medication Instructions:  Your physician has recommended you make the following change in your medication:  STOP Amiodarone the end of October 2017   Labwork: None ordered  Testing/Procedures: None ordered  Follow-Up: Your physician wants you to follow-up in: December 2017 You will receive a reminder letter in the mail two months in advance. If you don't receive a letter, please call our office to schedule the follow-up appointment.   Any Other Special Instructions Will Be Listed Below (If Applicable).     If you need a refill on your cardiac medications before your next appointment, please call your pharmacy.      Signed, Sinclair Grooms, MD  11/09/2015 1:16 PM    [facility] [address], [place], [place]  [address] Phone: [phone]; Fax: [phone]

## 2015-11-09 ENCOUNTER — Ambulatory Visit (INDEPENDENT_AMBULATORY_CARE_PROVIDER_SITE_OTHER): Payer: Medicare Other | Admitting: Interventional Cardiology

## 2015-11-09 ENCOUNTER — Encounter: Payer: Self-pay | Admitting: Interventional Cardiology

## 2015-11-09 VITALS — BP 122/78 | HR 62 | Ht 64.5 in | Wt 200.6 lb

## 2015-11-09 DIAGNOSIS — E785 Hyperlipidemia, unspecified: Secondary | ICD-10-CM | POA: Diagnosis not present

## 2015-11-09 DIAGNOSIS — Z951 Presence of aortocoronary bypass graft: Secondary | ICD-10-CM

## 2015-11-09 DIAGNOSIS — I481 Persistent atrial fibrillation: Secondary | ICD-10-CM | POA: Diagnosis not present

## 2015-11-09 DIAGNOSIS — I10 Essential (primary) hypertension: Secondary | ICD-10-CM | POA: Diagnosis not present

## 2015-11-09 DIAGNOSIS — Z79899 Other long term (current) drug therapy: Secondary | ICD-10-CM

## 2015-11-09 DIAGNOSIS — I4819 Other persistent atrial fibrillation: Secondary | ICD-10-CM

## 2015-11-09 DIAGNOSIS — Z7901 Long term (current) use of anticoagulants: Secondary | ICD-10-CM

## 2015-11-09 NOTE — Patient Instructions (Addendum)
Medication Instructions:  Your physician has recommended you make the following change in your medication:  STOP Amiodarone the end of October 2017   Labwork: None ordered  Testing/Procedures: None ordered  Follow-Up: Your physician wants you to follow-up in: December 2017 You will receive a reminder letter in the mail two months in advance. If you don't receive a letter, please call our office to schedule the follow-up appointment.   Any Other Special Instructions Will Be Listed Below (If Applicable).     If you need a refill on your cardiac medications before your next appointment, please call your pharmacy.

## 2015-12-02 ENCOUNTER — Telehealth: Payer: Self-pay | Admitting: Interventional Cardiology

## 2015-12-02 NOTE — Telephone Encounter (Signed)
Needs to com in for ECG to insure NSR.

## 2015-12-02 NOTE — Telephone Encounter (Signed)
Returned call to patient.She stated for the past 2 weeks she has been having intermittent irregular heart beat.Stated feels like heart flip flops.Stated when she had AFib she never felt any irreg beats.Stated this is new.Stated she feels ok no sob,no chest pain.Stated she drinks a lot of tea.Advised to decrease caffeine.Advised I will send message to [place] for advice.

## 2015-12-02 NOTE — Telephone Encounter (Signed)
New message      Pt states she has AFIB and had a shock to get the heart in rhythm. She is experiencing a new indescribable feeling now and wants to advice as to what to do. Please call.

## 2015-12-03 ENCOUNTER — Ambulatory Visit (INDEPENDENT_AMBULATORY_CARE_PROVIDER_SITE_OTHER): Payer: Medicare Other

## 2015-12-03 DIAGNOSIS — I481 Persistent atrial fibrillation: Secondary | ICD-10-CM | POA: Diagnosis not present

## 2015-12-03 DIAGNOSIS — I4819 Other persistent atrial fibrillation: Secondary | ICD-10-CM

## 2015-12-03 NOTE — Patient Instructions (Signed)
1.) Reason for visit: EKG  2.) Name of MD requesting visit: Dr.Henry Tamala Julian  3.) H&P: Spoke to patient 12/02/15 complaining of irregular heart beat feels like " flip flops ".  4.) ROS related to problem: Dr.Smith advised needs EKG   5.) Assessment and plan per MD: DOD Dr.Kelly reviewed EKG revealed sinus rhythm rate 50.Continue same medication.Keep appointment as planned with Dr.Smith.

## 2015-12-03 NOTE — Telephone Encounter (Signed)
Spoke to patient.Dr.Smith advised needs a EKG to make sure she is in sinus rhythm.EKG appointment scheduled this afternoon at [facility] office.

## 2015-12-03 NOTE — Telephone Encounter (Signed)
F/U  Returning a call from yesterday from [place].

## 2015-12-03 NOTE — Telephone Encounter (Signed)
Returned call to patient no answer.LMTC. 

## 2015-12-04 NOTE — Progress Notes (Signed)
ECG reviewed and no AF noted. Tracing shows prolonged QT. Please get BMET and magnesium level.

## 2015-12-07 ENCOUNTER — Telehealth: Payer: Self-pay

## 2015-12-07 ENCOUNTER — Other Ambulatory Visit: Payer: Self-pay

## 2015-12-07 ENCOUNTER — Other Ambulatory Visit: Payer: Medicare Other | Admitting: *Deleted

## 2015-12-07 DIAGNOSIS — I4819 Other persistent atrial fibrillation: Secondary | ICD-10-CM

## 2015-12-07 DIAGNOSIS — I10 Essential (primary) hypertension: Secondary | ICD-10-CM

## 2015-12-07 LAB — BASIC METABOLIC PANEL
BUN: 12 mg/dL (ref 7–25)
CALCIUM: 8.5 mg/dL — AB (ref 8.6–10.4)
CO2: 30 mmol/L (ref 20–31)
Chloride: 105 mmol/L (ref 98–110)
Creat: 0.8 mg/dL (ref 0.60–0.93)
GLUCOSE: 108 mg/dL — AB (ref 65–99)
Potassium: 3.6 mmol/L (ref 3.5–5.3)
Sodium: 143 mmol/L (ref 135–146)

## 2015-12-07 LAB — MAGNESIUM: MAGNESIUM: 1.8 mg/dL (ref 1.5–2.5)

## 2015-12-07 NOTE — Telephone Encounter (Signed)
Spoke to patient Dr.Smith reviewed EKG done 12/03/15 revealed no AFib,prolonged QT.Advised bmet,magnesium.Pt will have done today.

## 2015-12-08 ENCOUNTER — Telehealth: Payer: Self-pay

## 2015-12-08 MED ORDER — POTASSIUM CHLORIDE CRYS ER 20 MEQ PO TBCR: 20.0000 meq | EXTENDED_RELEASE_TABLET | Freq: Every day | ORAL | 11 refills | Status: DC

## 2015-12-08 NOTE — Telephone Encounter (Signed)
-----   Message from Belva Crome, MD sent at 12/07/2015  9:10 PM EDT ----- Please start K-Dur 20 meq daily This may decrease the palpitations.

## 2015-12-08 NOTE — Telephone Encounter (Signed)
Pt aware of lab results and Dr.Smith's recommendation with verbal understanding. Please start K-Dur 20 meq daily This may decrease the palpitations. Pt is agreeable Rx sent to pt pharmacy.

## 2015-12-10 HISTORY — PX: SPINAL CORD STIMULATOR IMPLANT: SHX2422

## 2016-02-02 ENCOUNTER — Emergency Department (HOSPITAL_COMMUNITY)
Admission: EM | Admit: 2016-02-02 | Discharge: 2016-02-02 | Disposition: A | Discharge status: Home / Self Care | Payer: Medicare Other | Attending: Emergency Medicine | Admitting: Emergency Medicine

## 2016-02-02 ENCOUNTER — Encounter (HOSPITAL_COMMUNITY): Payer: Self-pay | Admitting: Emergency Medicine

## 2016-02-02 ENCOUNTER — Emergency Department (HOSPITAL_COMMUNITY): Payer: Medicare Other

## 2016-02-02 DIAGNOSIS — Z79899 Other long term (current) drug therapy: Secondary | ICD-10-CM | POA: Insufficient documentation

## 2016-02-02 DIAGNOSIS — I10 Essential (primary) hypertension: Secondary | ICD-10-CM | POA: Diagnosis not present

## 2016-02-02 DIAGNOSIS — W1839XA Other fall on same level, initial encounter: Secondary | ICD-10-CM | POA: Insufficient documentation

## 2016-02-02 DIAGNOSIS — Z7982 Long term (current) use of aspirin: Secondary | ICD-10-CM | POA: Diagnosis not present

## 2016-02-02 DIAGNOSIS — E039 Hypothyroidism, unspecified: Secondary | ICD-10-CM | POA: Diagnosis not present

## 2016-02-02 DIAGNOSIS — Y9389 Activity, other specified: Secondary | ICD-10-CM | POA: Insufficient documentation

## 2016-02-02 DIAGNOSIS — Y999 Unspecified external cause status: Secondary | ICD-10-CM | POA: Insufficient documentation

## 2016-02-02 DIAGNOSIS — Z7722 Contact with and (suspected) exposure to environmental tobacco smoke (acute) (chronic): Secondary | ICD-10-CM | POA: Diagnosis not present

## 2016-02-02 DIAGNOSIS — I2510 Atherosclerotic heart disease of native coronary artery without angina pectoris: Secondary | ICD-10-CM | POA: Insufficient documentation

## 2016-02-02 DIAGNOSIS — S299XXA Unspecified injury of thorax, initial encounter: Secondary | ICD-10-CM | POA: Diagnosis present

## 2016-02-02 DIAGNOSIS — Y929 Unspecified place or not applicable: Secondary | ICD-10-CM | POA: Insufficient documentation

## 2016-02-02 DIAGNOSIS — S20212A Contusion of left front wall of thorax, initial encounter: Secondary | ICD-10-CM

## 2016-02-02 MED ORDER — HYDROCODONE-ACETAMINOPHEN 5-325 MG PO TABS: 1.0000 | ORAL_TABLET | Freq: Four times a day (QID) | ORAL | 0 refills | Status: DC | PRN

## 2016-02-02 NOTE — ED Notes (Signed)
Educated patient on the use of spirometer.  Patient demonstrated use.  Patient verbalized understanding.  Patient dressing and will escort patient to lobby.

## 2016-02-02 NOTE — ED Notes (Signed)
Patient d/c'd self care.  F/U and medications reviewed, use of spirometer demonstrated by patient. Patient verbalized understanding of d/c instructions.

## 2016-02-02 NOTE — ED Provider Notes (Signed)
[facility] DEPT Provider Note   CSN: [id number] Arrival date & time: 02/02/16  1153     History   Chief Complaint Chief Complaint  Patient presents with  . ribs pain    left     HPI [name] is a 75 y.o. female.  75 year old female with past medical history below who presents with left rib pain. One week ago, the patient was sitting at her table and reached to pick up something. She lost her balance and fell, striking her left side. She did not seek immediate medical attention at that time but did today because of persistent left rib pain under her left breast. She denies any head injury, loss of consciousness, or other injury. She states that she is not short of breath but it does hurt when she takes a deep breath in. No other complaints.   The history is provided by the patient.    Past Medical History:  Diagnosis Date  . Addison's disease ([place])   . Angina   . Anxiety   . Arthritis    "everywhere"  . Atrial fibrillation ([place])   . Chronic lower back pain   . 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  last knee surgery dr. Elmyra Ricks gave large dose of hydrocortisal and avoided the hypotensive side effectas of addison's disease.  . Coronary artery disease   . Depression   . Dysrhythmia   . GERD (gastroesophageal reflux disease)   . Hyperlipemia   . Hypertension   . Hypothyroidism   . Lumbar radiculopathy 04/27/2014  . PONV (postoperative nausea and vomiting)   . Recurrent upper respiratory infection (URI)   . Shortness of breath     Patient Active Problem List   Diagnosis Date Noted  . Persistent atrial fibrillation ([place])   . Chronic anticoagulation 10/06/2015  . On amiodarone therapy 10/06/2015  . Atrial fibrillation ([place]) 09/15/2015  . Snoring 02/17/2015  . Addison disease ([place]) 09/30/2014  . Chronic back pain 09/30/2014  . Obesity-BMI 35 09/30/2014  . Hematochezia-(Hgb stable) 09/03/2014  . DOE  (dyspnea on exertion) 09/01/2014  . Lumbar radiculopathy 04/27/2014  . UTI (lower urinary tract infection) 07/09/2011  . Leukocytosis, unspecified 07/08/2011  . Hypokalemia 07/08/2011  . Nausea and vomiting 07/08/2011  . 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 Multiple pulmonary nodules determined by computed tomography of lung 11/18/2008  . Osteoarthritis 11/18/2008  . Hyperlipidemia with target LDL less than 70 11/17/2008  . Essential hypertension 11/17/2008    Past Surgical History:  Procedure Laterality Date  . ACHILLES TENDON SURGERY  07/16/14   left ankle, bone spur removed  . ANTERIOR LUMBAR FUSION  1980   L4-5  . BACK SURGERY    . CARDIAC CATHETERIZATION  2013  . CARDIAC CATHETERIZATION N/A 09/02/2014   Procedure: Left Heart Cath and Coronary Angiography;  Surgeon: Wellington Hampshire, MD;  Location: [facility];  Service: Cardiovascular;  Laterality: N/A;  . CARDIOVERSION N/A 10/22/2015   Procedure: CARDIOVERSION;  Surgeon: Sanda Klein, MD;  Location: [facility];  Service: Cardiovascular;  Laterality: N/A;  . CARPAL TUNNEL RELEASE Right   . CATARACT EXTRACTION, BILATERAL Bilateral   . CORONARY ARTERY BYPASS GRAFT  06/26/2011   Procedure: CORONARY ARTERY BYPASS GRAFTING (CABG);  Surgeon: Melrose Nakayama, MD;  Location: [facility];  Service: Open Heart Surgery;  Laterality: N/A;  times using Greater Saphenous Vein Graft harvested endoscopically from right leg  .  DIAGNOSTIC LAPAROSCOPY    . DILATION AND CURETTAGE OF UTERUS    . JOINT REPLACEMENT    . KNEE ARTHROSCOPY Bilateral   . LAPAROSCOPIC CHOLECYSTECTOMY    . Nerve stimulator of back  12/10/15  . REVISION TOTAL KNEE ARTHROPLASTY Right   . TONSILLECTOMY    . TOTAL KNEE ARTHROPLASTY Bilateral     OB History    No data available       Home Medications    Prior to Admission medications   Medication Sig Start Date End Date Taking? Authorizing Provider  acetaminophen (TYLENOL) 500 MG tablet Take  500 mg by mouth daily as needed for headache.    Historical Provider, MD  amiodarone (PACERONE) 200 MG tablet Take 200 mg by mouth daily. 10/06/15   Historical Provider, MD  amLODipine (NORVASC) 5 MG tablet Take 1 tablet (5 mg total) by mouth daily. 09/24/15   Isaiah Serge, NP  apixaban (ELIQUIS) 5 MG TABS tablet Take 1 tablet (5 mg total) by mouth 2 (two) times daily. 09/02/15   Belva Crome, MD  aspirin EC 81 MG tablet Take 81 mg by mouth every morning.    Historical Provider, MD  clonazePAM (KLONOPIN) 0.5 MG tablet Take 0.5-1 tablets (0.25-0.5 mg total) by mouth 2 (two) times daily as needed for anxiety. 09/07/14   Noralee Space, MD  CYANOCOBALAMIN IJ Inject as directed every 30 (thirty) days.    Historical Provider, MD  FLUoxetine (PROZAC) 20 MG capsule Take 20 mg by mouth every morning.    Historical Provider, MD  furosemide (LASIX) 20 MG tablet Take 20 mg by mouth as needed for fluid or edema.    Historical Provider, MD  HYDROcodone-acetaminophen (NORCO/VICODIN) 5-325 MG tablet Take 1-2 tablets by mouth every 6 (six) hours as needed for severe pain. 02/02/16   Sharlett Iles, MD  hydrocortisone (CORTEF) 20 MG tablet Take 20 mg by mouth 2 (two) times daily.    Historical Provider, MD  hyoscyamine (LEVSIN SL) 0.125 MG SL tablet Take 0.125 mg by mouth daily.  09/03/15   Historical Provider, MD  irbesartan (AVAPRO) 300 MG tablet Take 300 mg by mouth daily.  09/17/15   Historical Provider, MD  levothyroxine (SYNTHROID, LEVOTHROID) 88 MCG tablet Take 88 mcg by mouth daily before breakfast.    Historical Provider, MD  metroNIDAZOLE (FLAGYL) 250 MG tablet Take 1 tablet (250 mg total) by mouth 3 (three) times daily. 10/08/15   Milus Banister, MD  Multiple Vitamin (MULTIVITAMIN WITH MINERALS) TABS tablet Take 1 tablet by mouth daily.    Historical Provider, MD  nebivolol (BYSTOLIC) 5 MG tablet Take 2.5 mg by mouth at bedtime. Takes 2.5 mg daily    Historical Provider, MD  nitroGLYCERIN (NITROSTAT) 0.4 MG  SL tablet Place 0.4 mg under the tongue every 5 (five) minutes as needed for chest pain (3 DOSES MAX FOR CHEST PAIN).    Historical Provider, MD  omega-3 acid ethyl esters (LOVAZA) 1 g capsule Take 1 g by mouth daily.    Historical Provider, MD  omeprazole (PRILOSEC) 20 MG capsule Take 20 mg by mouth every morning.  11/19/13   Historical Provider, MD  potassium chloride SA (K-DUR,KLOR-CON) 20 MEQ tablet Take 1 tablet (20 mEq total) by mouth daily. 12/08/15   Belva Crome, MD  Probiotic Product (PROBIOTIC FORMULA PO) Take 1 capsule by mouth every morning. For upset stomach    Historical Provider, MD  Vitamin D, Ergocalciferol, (DRISDOL) 50000 UNITS CAPS Take 50,000  Units by mouth 2 (two) times a week. Takes on Tuesdays and Fridays    Historical Provider, MD    Family History Family History  Problem Relation Age of Onset  . Heart attack Father   . Hypertension Mother   . Stroke Mother   . Hypertension Maternal Grandfather   . Colon cancer Neg Hx     Social History Social History  Substance Use Topics  . Smoking status: Passive Smoke Exposure - Never Smoker    Packs/day: 0.00    Years: 0.00  . Smokeless tobacco: Never Used  . Alcohol use No     Allergies   Zanaflex [tizanidine]; Percocet [oxycodone-acetaminophen]; Sulfa antibiotics; and Sulfacetamide sodium   Review of Systems Review of Systems 10 Systems reviewed and are negative for acute change except as noted in the HPI.   Physical Exam Updated Vital Signs BP 150/72   Pulse 75   Temp 98.4 F (36.9 C)   Resp 18   SpO2 99%   Physical Exam  Constitutional: She is oriented to person, place, and time. She appears well-developed and well-nourished. No distress.  HENT:  Head: Normocephalic and atraumatic.  Moist mucous membranes  Eyes: Conjunctivae are normal. Pupils are equal, round, and reactive to light.  Neck: Neck supple.  Cardiovascular: Normal rate, regular rhythm and normal heart sounds.   No murmur  heard. Pulmonary/Chest: Effort normal and breath sounds normal. She exhibits tenderness.  Mild tenderness under L breast along anterior to lateral lower ribs, no crepitus or bruising  Abdominal: Soft. Bowel sounds are normal. She exhibits no distension. There is no tenderness.  Musculoskeletal: She exhibits edema (trace BLE).  Neurological: She is alert and oriented to person, place, and time.  Fluent speech  Skin: Skin is warm and dry.  No ecchymoses  Psychiatric: She has a normal mood and affect. Judgment normal.  Nursing note and vitals reviewed.    ED Treatments / Results  Labs (all labs ordered are listed, but only abnormal results are displayed) Labs Reviewed - No data to display  EKG  EKG Interpretation None       Radiology Dg Chest 2 View  Result Date: 02/02/2016 CLINICAL DATA:  Left rib cage pain.  Fall 1 week ago. EXAM: CHEST  2 VIEW COMPARISON:  Multiple exams, including 09/01/2014 FINDINGS: Prior CABG. Atherosclerotic aortic arch. Mild enlargement of the cardiopericardial silhouette. No edema. Dorsal column stimulator projects over the T6- T7 level within the posterior spinal canal. Thoracic spondylosis. No edema. Linear subsegmental atelectasis or scarring in the lower lobes. No pneumothorax and no pleural effusion. Suspected prior right Mumford procedure. IMPRESSION: 1. Linear subsegmental atelectasis or scarring in the lower lobes. 2. Stable mild enlargement of the cardiopericardial silhouette, without edema. Prior CABG 3. Atherosclerotic aortic arch. Electronically Signed   By: Van Clines M.D.   On: 02/02/2016 12:41    Procedures Procedures (including critical care time)  Medications Ordered in ED Medications - No data to display   Initial Impression / Assessment and Plan / ED Course  I have reviewed the triage vital signs and the nursing notes.  Pertinent imaging results that were available during my care of the patient were reviewed by me and  considered in my medical decision making (see chart for details).  Clinical Course   PT presents w/ ongoing L rib pain after fall 1 week ago. Well appearing w/ reassuring VS on exam. O2 98-99% on RA. She had no crepitus or bruising on exam. No extremity or  abdominal pain. CXR shows no acute findings, specifically no hemo/pneumothorax. Given that the patient is 1 week out from her injuries w/ a reassuring CXR, I do not feel she needs further imaging at this point as she denies SOB or other problems. Discussed importance of pain control and incentive spirometry as well as f/u w/ PCP if sx do not improve. Reviewed return precautions. Patient voiced understanding of plan and was discharged in satisfactory condition.  Final Clinical Impressions(s) / ED Diagnoses   Final diagnoses:  Rib contusion, left, initial encounter    New Prescriptions New Prescriptions   HYDROCODONE-ACETAMINOPHEN (NORCO/VICODIN) 5-325 MG TABLET    Take 1-2 tablets by mouth every 6 (six) hours as needed for severe pain.     Sharlett Iles, MD 02/02/16 [phone]

## 2016-02-02 NOTE — ED Triage Notes (Signed)
Pt reports fall x 1 week, did not seek medical care then. Pt co right ribs cage pain. No bruising noted. No new onset of shortness of breath. denies lon nor head injury. Pt reports tripped and fell , no dizziness nor weakness prior to fall.

## 2016-02-28 ENCOUNTER — Encounter: Payer: Self-pay | Admitting: Interventional Cardiology

## 2016-03-06 ENCOUNTER — Encounter: Payer: Self-pay | Admitting: Interventional Cardiology

## 2016-03-06 ENCOUNTER — Ambulatory Visit (INDEPENDENT_AMBULATORY_CARE_PROVIDER_SITE_OTHER): Payer: Medicare Other | Admitting: Interventional Cardiology

## 2016-03-06 VITALS — BP 132/76 | HR 65 | Ht 64.5 in | Wt 202.8 lb

## 2016-03-06 DIAGNOSIS — R2689 Other abnormalities of gait and mobility: Secondary | ICD-10-CM

## 2016-03-06 DIAGNOSIS — Z79899 Other long term (current) drug therapy: Secondary | ICD-10-CM

## 2016-03-06 DIAGNOSIS — R0683 Snoring: Secondary | ICD-10-CM

## 2016-03-06 DIAGNOSIS — I4819 Other persistent atrial fibrillation: Secondary | ICD-10-CM

## 2016-03-06 DIAGNOSIS — I10 Essential (primary) hypertension: Secondary | ICD-10-CM | POA: Diagnosis not present

## 2016-03-06 DIAGNOSIS — Z951 Presence of aortocoronary bypass graft: Secondary | ICD-10-CM

## 2016-03-06 DIAGNOSIS — I481 Persistent atrial fibrillation: Secondary | ICD-10-CM | POA: Diagnosis not present

## 2016-03-06 DIAGNOSIS — Z7901 Long term (current) use of anticoagulants: Secondary | ICD-10-CM

## 2016-03-06 DIAGNOSIS — R0609 Other forms of dyspnea: Secondary | ICD-10-CM

## 2016-03-06 DIAGNOSIS — E785 Hyperlipidemia, unspecified: Secondary | ICD-10-CM

## 2016-03-06 DIAGNOSIS — R0600 Dyspnea, unspecified: Secondary | ICD-10-CM

## 2016-03-06 NOTE — Patient Instructions (Signed)
Medication Instructions:  1) DECREASE your Aspirin to 81mg  on Monday, Wednesday and Friday only  Labwork: None  Testing/Procedures: None  Follow-Up: Your physician wants you to follow-up in: 6 months with [name] with an EKG. You will receive a reminder letter in the mail two months in advance. If you don't receive a letter, please call our office to schedule the follow-up appointment.   Any Other Special Instructions Will Be Listed Below (If Applicable).  Please let us know if you continue to have issues with falling or if you feel like you are in atrial fibrillation.    If you need a refill on your cardiac medications before your next appointment, please call your pharmacy.

## 2016-03-06 NOTE — Progress Notes (Signed)
Cardiology Office Note    Date:  03/06/2016   ID:  Marim, Klopp Apr 20, 1940, MRN [id number]  PCP:  Jerlyn Ly, MD  Cardiologist: Sinclair Grooms, MD   Chief Complaint  Patient presents with  . Atrial Fibrillation    History of Present Illness:  [name] is a 75 y.o. female follow-up of coronary artery disease with prior bypass grafting, atrial fibrillation resulting in acute diastolic heart failure, hypertension, chronic anticoagulation therapy with Eliquis and hyperlipidemia.Amiodarone therapy was used transiently but discontinued because of the patient's concern for possible complications.  She is returning today for follow-up after discontinuation of amiodarone. She has not had episodes of arrhythmia to suggest atrial fibrillation. She is concerned because she has had 2 falls. One occurred while walking up a flight of stairs. The other episode occurred when she tumbled out of the chair reaching to pick up a cup that had fallen on the floor. She denies loss of consciousness with either episode. She denies anginal quality chest discomfort.  Past Medical History:  Diagnosis Date  . Addison's disease ([place])   . Angina   . Anxiety   . Arthritis    "everywhere"  . Atrial fibrillation ([name])   . Chronic lower back pain   . 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  last knee surgery dr. Elmyra Ricks gave large dose of hydrocortisal and avoided the hypotensive side effectas of addison's disease.  . Coronary artery disease   . Depression   . Dysrhythmia   . GERD (gastroesophageal reflux disease)   . Hyperlipemia   . Hypertension   . Hypothyroidism   . Lumbar radiculopathy 04/27/2014  . PONV (postoperative nausea and vomiting)   . Recurrent upper respiratory infection (URI)   . Shortness of breath     Past Surgical History:  Procedure Laterality Date  . ACHILLES TENDON SURGERY  07/16/14   left ankle, bone spur removed    . ANTERIOR LUMBAR FUSION  1980   L4-5  . BACK SURGERY    . CARDIAC CATHETERIZATION  2013  . CARDIAC CATHETERIZATION N/A 09/02/2014   Procedure: Left Heart Cath and Coronary Angiography;  Surgeon: Wellington Hampshire, MD;  Location: [facility];  Service: Cardiovascular;  Laterality: N/A;  . CARDIOVERSION N/A 10/22/2015   Procedure: CARDIOVERSION;  Surgeon: Sanda Klein, MD;  Location: [facility];  Service: Cardiovascular;  Laterality: N/A;  . CARPAL TUNNEL RELEASE Right   . CATARACT EXTRACTION, BILATERAL Bilateral   . CORONARY ARTERY BYPASS GRAFT  06/26/2011   Procedure: CORONARY ARTERY BYPASS GRAFTING (CABG);  Surgeon: Melrose Nakayama, MD;  Location: [facility];  Service: Open Heart Surgery;  Laterality: N/A;  times using Greater Saphenous Vein Graft harvested endoscopically from right leg  . DIAGNOSTIC LAPAROSCOPY    . DILATION AND CURETTAGE OF UTERUS    . JOINT REPLACEMENT    . KNEE ARTHROSCOPY Bilateral   . LAPAROSCOPIC CHOLECYSTECTOMY    . Nerve stimulator of back  12/10/15  . REVISION TOTAL KNEE ARTHROPLASTY Right   . TONSILLECTOMY    . TOTAL KNEE ARTHROPLASTY Bilateral     Current Medications: Outpatient Medications Prior to Visit  Medication Sig Dispense Refill  . acetaminophen (TYLENOL) 500 MG tablet Take 500 mg by mouth daily as needed for headache.    [name] amLODipine (NORVASC) 5 MG tablet Take 1 tablet (5 mg total) by mouth daily. 30 tablet 6  . apixaban (ELIQUIS) 5 MG  TABS tablet Take 1 tablet (5 mg total) by mouth 2 (two) times daily. 60 tablet 11  . aspirin EC 81 MG tablet Take 81 mg by mouth every Monday, Wednesday, and Friday.    . clonazePAM (KLONOPIN) 0.5 MG tablet Take 0.5-1 tablets (0.25-0.5 mg total) by mouth 2 (two) times daily as needed for anxiety. 60 tablet 3  . CYANOCOBALAMIN IJ Inject as directed every 30 (thirty) days.    [name] FLUoxetine (PROZAC) 20 MG capsule Take 20 mg by mouth every morning.    . furosemide (LASIX) 20 MG tablet Take 20 mg by mouth as  needed for fluid or edema.    [name] HYDROcodone-acetaminophen (NORCO/VICODIN) 5-325 MG tablet Take 1-2 tablets by mouth every 6 (six) hours as needed for severe pain. 12 tablet 0  . hydrocortisone (CORTEF) 20 MG tablet Take 20 mg by mouth 2 (two) times daily.    . irbesartan (AVAPRO) 300 MG tablet Take 300 mg by mouth daily.     [name] levothyroxine (SYNTHROID, LEVOTHROID) 88 MCG tablet Take 88 mcg by mouth daily before breakfast.    . Multiple Vitamin (MULTIVITAMIN WITH MINERALS) TABS tablet Take 1 tablet by mouth daily.    . nebivolol (BYSTOLIC) 5 MG tablet Take 2.5 mg by mouth at bedtime. Takes 2.5 mg daily    . nitroGLYCERIN (NITROSTAT) 0.4 MG SL tablet Place 0.4 mg under the tongue every 5 (five) minutes as needed for chest pain (3 DOSES MAX FOR CHEST PAIN).    [name] omega-3 acid ethyl esters (LOVAZA) 1 g capsule Take 1 g by mouth daily.    [name] omeprazole (PRILOSEC) 20 MG capsule Take 20 mg by mouth every morning.     . potassium chloride SA (K-DUR,KLOR-CON) 20 MEQ tablet Take 1 tablet (20 mEq total) by mouth daily. 30 tablet 11  . Probiotic Product (PROBIOTIC FORMULA PO) Take 1 capsule by mouth every morning. For upset stomach    . Vitamin D, Ergocalciferol, (DRISDOL) 50000 UNITS CAPS Take 50,000 Units by mouth 2 (two) times a week. Takes on Tuesdays and Fridays    . amiodarone (PACERONE) 200 MG tablet Take 200 mg by mouth daily.    . hyoscyamine (LEVSIN SL) 0.125 MG SL tablet Take 0.125 mg by mouth daily.     . metroNIDAZOLE (FLAGYL) 250 MG tablet Take 1 tablet (250 mg total) by mouth 3 (three) times daily. 42 tablet 0   No facility-administered medications prior to visit.      Allergies:   Zanaflex [tizanidine]; Percocet [oxycodone-acetaminophen]; Sulfa antibiotics; and Sulfacetamide sodium   Social History   Social History  . Marital status: Widowed    Spouse name: N/A  . Number of children: N/A  . Years of education: N/A   Occupational History  . Retired    Social History Main Topics  .  Smoking status: Passive Smoke Exposure - Never Smoker    Packs/day: 0.00    Years: 0.00  . Smokeless tobacco: Never Used  . Alcohol use No  . Drug use: No  . Sexual activity: No   Other Topics Concern  . None   Social History Narrative  . None     Family History:  The patient's family history includes Heart attack in her father; Hypertension in her maternal grandfather and mother; Stroke in her mother.   ROS:   Please see the history of present illness.    Illness in her left lower rib cage related to a recent fall. She feels that there is  difficulty with balance.  All other systems reviewed and are negative.   PHYSICAL EXAM:   VS:  BP 132/76 (BP Location: Left Arm)   Pulse 65   Ht 5' 4.5" (1.638 m)   Wt 202 lb 12.8 oz (92 kg)   BMI 34.27 kg/m    GEN: Well nourished, well developed, in no acute distress  HEENT: normal  Neck: no JVD, carotid bruits, or masses Cardiac: IRR; no murmurs, rubs, or gallops,no edema  Respiratory:  clear to auscultation bilaterally, normal work of breathing GI: soft, nontender, nondistended, + BS MS: no deformity or atrophy  Skin: warm and dry, no rash Neuro:  Alert and Oriented x 3, Strength and sensation are intact Psych: euthymic mood, full affect  Wt Readings from Last 3 Encounters:  03/06/16 202 lb 12.8 oz (92 kg)  11/09/15 200 lb 9.6 oz (91 kg)  10/22/15 199 lb (90.3 kg)      Studies/Labs Reviewed:   EKG:  EKG  Not repeated  Recent Labs: 10/08/2015: ALT 17; Hemoglobin 13.4; Platelets 304.0 12/07/2015: BUN 12; Creat 0.80; Magnesium 1.8; Potassium 3.6; Sodium 143   Lipid Panel    Component Value Date/Time   CHOL 100 09/02/2014 0346   TRIG 78 09/02/2014 0346   HDL 40 (L) 09/02/2014 0346   CHOLHDL 2.5 09/02/2014 0346   VLDL 16 09/02/2014 0346   LDLCALC 44 09/02/2014 0346    Additional studies/ records that were reviewed today include:  A data    ASSESSMENT:    1. Persistent atrial fibrillation ([place])   2. Essential  hypertension   3. S/P CABG x 3 2013- patent grafts 09/03/14   4. On amiodarone therapy   5. Snoring   6. Hyperlipidemia with target LDL less than 70   7. Chronic anticoagulation   8. DOE (dyspnea on exertion)   9. Imbalance      PLAN:  In order of problems listed above:  1. Intervening sinus rhythm. Today on auscultation there is clear group beating as she may be an atrial or ventricular bigeminy. She has no symptoms to suggest recurrence of atrial fibrillation. 2. I did check for orthostasis. Resting sitting blood pressure 150/75 mmHg with heart rate of 68 bpm. Standing blood pressure 160/70 with a heart rate of 72 bpm. 3. Without recurrent angina. Since she is on anticoagulation therapy,  Eliquis, I will decrease aspirin to 81 mg on Monday Wednesday and Friday. 4. Amiodarone has been discontinued because of her concern about the possibility of toxicity. She did not have any problems on amiodarone and we could go back to this medication in the future. 5. Not addressed.  6. Continue statin therapy. 7. Continue Eliquis but will decrease aspirin to 3 times per week. 8. His complaint is chronic and has not significantly improved since bypass surgery. 75. [place] neurological, if she continues to complain of this problem we will consider referral to neurology.     Medication Adjustments/Labs and Tests Ordered: Current medicines are reviewed at length with the patient today.  Concerns regarding medicines are outlined above.  Medication changes, Labs and Tests ordered today are listed in the Patient Instructions below. Patient Instructions  Medication Instructions:  1) DECREASE your Aspirin to 81mg  on Monday, Wednesday and Friday only  Labwork: None  Testing/Procedures: None  Follow-Up: Your physician wants you to follow-up in: 6 months with [name] with an EKG. You will receive a reminder letter in the mail two months in advance. If you don't receive a letter,  please call our office  to schedule the follow-up appointment.   Any Other Special Instructions Will Be Listed Below (If Applicable).  Please let us know if you continue to have issues with falling or if you feel like you are in atrial fibrillation.    If you need a refill on your cardiac medications before your next appointment, please call your pharmacy.      Signed, Sinclair Grooms, MD  03/06/2016 10:50 AM    [facility] [address], [place], [place]  [address] Phone: [phone]; Fax: [phone]

## 2016-03-16 ENCOUNTER — Encounter: Payer: Self-pay | Admitting: Pulmonary Disease

## 2016-03-16 ENCOUNTER — Ambulatory Visit (INDEPENDENT_AMBULATORY_CARE_PROVIDER_SITE_OTHER): Payer: Medicare Other | Admitting: Pulmonary Disease

## 2016-03-16 VITALS — BP 114/70 | HR 60 | Temp 97.2°F | Ht 64.65 in | Wt 205.2 lb

## 2016-03-16 DIAGNOSIS — E6609 Other obesity due to excess calories: Secondary | ICD-10-CM

## 2016-03-16 DIAGNOSIS — G8929 Other chronic pain: Secondary | ICD-10-CM

## 2016-03-16 DIAGNOSIS — Z7901 Long term (current) use of anticoagulants: Secondary | ICD-10-CM

## 2016-03-16 DIAGNOSIS — R0683 Snoring: Secondary | ICD-10-CM

## 2016-03-16 DIAGNOSIS — M549 Dorsalgia, unspecified: Secondary | ICD-10-CM

## 2016-03-16 DIAGNOSIS — I25709 Atherosclerosis of coronary artery bypass graft(s), unspecified, with unspecified angina pectoris: Secondary | ICD-10-CM | POA: Insufficient documentation

## 2016-03-16 DIAGNOSIS — M150 Primary generalized (osteo)arthritis: Secondary | ICD-10-CM

## 2016-03-16 DIAGNOSIS — Z6834 Body mass index (BMI) 34.0-34.9, adult: Secondary | ICD-10-CM

## 2016-03-16 DIAGNOSIS — M159 Polyosteoarthritis, unspecified: Secondary | ICD-10-CM

## 2016-03-16 DIAGNOSIS — R0609 Other forms of dyspnea: Secondary | ICD-10-CM

## 2016-03-16 DIAGNOSIS — R938 Abnormal findings on diagnostic imaging of other specified body structures: Secondary | ICD-10-CM

## 2016-03-16 DIAGNOSIS — R9389 Abnormal findings on diagnostic imaging of other specified body structures: Secondary | ICD-10-CM

## 2016-03-16 DIAGNOSIS — R0600 Dyspnea, unspecified: Secondary | ICD-10-CM

## 2016-03-16 DIAGNOSIS — I2510 Atherosclerotic heart disease of native coronary artery without angina pectoris: Secondary | ICD-10-CM

## 2016-03-16 DIAGNOSIS — M8949 Other hypertrophic osteoarthropathy, multiple sites: Secondary | ICD-10-CM

## 2016-03-16 DIAGNOSIS — E66811 Obesity, class 1: Secondary | ICD-10-CM

## 2016-03-16 DIAGNOSIS — E271 Primary adrenocortical insufficiency: Secondary | ICD-10-CM

## 2016-03-16 DIAGNOSIS — I480 Paroxysmal atrial fibrillation: Secondary | ICD-10-CM

## 2016-03-16 DIAGNOSIS — I10 Essential (primary) hypertension: Secondary | ICD-10-CM

## 2016-03-16 DIAGNOSIS — Z951 Presence of aortocoronary bypass graft: Secondary | ICD-10-CM

## 2016-03-16 NOTE — Patient Instructions (Signed)
Today we updated your med list in our EPIC system...    Continue your current medications the same...  Do the best you can w/ an exercise program...  Call for any questions...  Let's plan a follow up visit in 40mo, sooner if needed for breathing problems.Marland KitchenMarland Kitchen

## 2016-03-16 NOTE — Progress Notes (Signed)
Subjective:     Patient ID: [name], female   DOB: December 25, 1940, 75 y.o.   MRN: [id number]  HPI 75 y/o WF followed for dyspnea felt to be multifactorial & she is improved on Klonopin rx...  ~  September 07, 2014: Initial pulmonary consult w/ SN>        46 y/o WF, referred by DrPerini for a pulmonary evaluation to try & sort out the cause of her dyspnea> [name] saw DrWright in 2010 w 2 tiny left pulm nodules- no change serially & felt to be benign;  She presents on this occas w/ 75mo hx SOB, worse over the last several weeks, notes DOE walking room to room/ on stairs/ etc;  She thinks this is similar to her cardiac presentation previously but repeat cardiac eval recently was neg;  She had CABG in 2013 followed by Cardiac Rehab and did very well but by her own admission she has been sedentary over the last 60yrs due to "major back problems" evaluated by DrWillis & DrDalton-Bethea, on TENS & looking into nerve stim; also had 3 different TKRs and a toltal of 10 joint surgeries...  Her current symptoms include SOB- "I can't talk & walk due to giving out", notes trouble getting the air "IN", can't get a DB, doesn't feel satisfied breathing (a sensation that is relieved transiently by a deep sigh breath); note that review of old notes from 2010 indicates that pt was c/o SOB at rest & w/ activity at that time... Current Meds> no pulmonary meds;  She has hx CAD- s/p 3vessel CABG 2013, DM, HL, etc (note from DrPerrini reviewed)> on Repatha, Imdur, Bystolic, Amlodipine, Avapro, Omep, Levothy, Prozac...      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      EXAM showed Afeb, VSS, O2sat=95% on RA; Wt=205#, 5'4"Tall, BMI=34-35;  HEENT- neg, mallampati2;  Chest- clear w/o w/r/r;  Heart- RR, med stern scar, w/o m/r/g;  Abd- obese, panniculus, soft/neg;  Ext- VI w/o c/c/e...  Last CT Chest 01/29/13 showed stable cardiomeg, mild biapical pleuroparenchymal scarring, all prev noted noncalcif nodules are stable w/o change, no new lesions, no adenopathy, degen changes in Tspine...   Cath 09/2014 w/ 3 vessel CAD and patent grafts...  CXR 09/01/14 showed stable mild cardiomeg, s/p median sternotomy, lungs clear, DJD Tspine, NAD...   Spirometry 09/07/14 showed FVC=1.95 (70%), FEV1=1.47 (71%), %1sec=76, mid-flows are sl reduced at 66% predicted... This is c/w mild small airways dis & poss mild superimposed restriction...  Ambulatory oxygen saturation test 09/07/14> O2sat=95% on RA w/ pulse=69/min;  She walked 3 laps in the office w/ lowest O2sat=91% w/ pulse=93/min...   LABS 6/16:  Chems- ok x BS=155;  CBC- wnl;   IMP/PLAN>>  [name] is a 75 y/o woman w/ multisys dis- cardiac, ortho, chr back pain;  From the pulmonary standpoint she is a never smoker w/o hx pulmonary dis w/ an essentially clear CXR (s/p CABG), adeq oxygenation, and PFTs w/ poss mild restriction at most;  Her dyspnea is most likely multifactorial in etiology- w/ a type of "chest wall musc spasm"  suggested by her history;  In this regard I have suggested a trial of KLONOPIN 0.5mg  bid, and the use of an incentive spirometer as a lung exerciser... All questions were answered and she is requested to f/u in 4-6wks.  ~  October 19, 2014:  67mo ROV w/ SN>  Areisy reports a rapid response to the Klonopin therapy w/ resolution of her dyspnea & she is feeling better overall; she has been able to wean down her dose in the interval- now taking 1/2 tab in the AM and 1 tab Qhs to help her rest (rests better & wakes feeling more energy etc);  She has checked into cardiac rehab for an  exercise program & she will pursue this once she gets her nerve stimulator implanted in her back for her severe LBP (per Dr.Dalton-Bethea and [place] DrKilpatrick in [place])...     EXAM reveals Afeb, VSS, O2sat=96% on RA;  HEENT- neg;  Chest- clear;  Heart- RR w/o m/r/g... IMP/PLAN>>  We discussed slowing weaning the Klonopin as tolerated; she will call for any questions or problems; plan ROV in 3-43mo...  ~  February 17, 2015:  49mo ROV w/ SN>  [name] continues to do well on the Klonopin- now down to 1/2 tab Qhs and occas 1/2 tab in AM if needed;  She notes sl SOB w/ exertion but denies cough, sput, hemoptysis, CP, etc;  She tells me that she had back surg 12/2014 by DrKilpatrick ([place]) in [place] w/ a neurostimulator implant, this device is controlled by remote & appears to be very successful, really helping her chr back pain;  She had a cardiac f/u w/ DrHSmith 09/2014- HBP, CAD, s/p CABG 0000000, diastolic CHF, dysrhythmia; cath 09/2014 showed patent grafts & she continues to do well; she tells me that she is anxious to start back into cardiac rehab after 1st of the yr...     [name] mentioned prob w/ snoring (& dry mouth in the AM) to me today- she denies OSA & knows a good bit about this since her husb was on CPAP for yrs (he passed away 8 yrs ago); her CC is snoring (knows this is a prob x yrs & recently underscored when on a trip w/ her girlfriends & her snoring was an issue for the group); she actually got good relief w/ breathe-right nasal strips but she is upset because the new strips will NOT stick to her nose;  We discussed several options to consider> ask Derm if the have a good adhesive, consider dental appliance OTC or from oral surg, consider home sleep study to check for OSA & perhaps even going w/ CPAP for the snoring (ie- pvt pay if there is no OSA on the study)...     EXAM reveals Afeb, VSS, O2sat=96% on RA;  HEENT- neg;  Chest- clear;  Heart- RR w/o m/r/g; Ext- VI w/o c/c/e... IMP/PLAN>>  [name] continues to do well  on the Klonopin, no inhalers needed; meds were adjusted by DrSmith (off Isosorbide), she is looking forward to starting cardiac rehab again... We discussed ROV in 47mo.  ~  September 15, 2015:  34mo ROV w/ SN>  [name] developed incr SOB last month & saw DrSmith w/ new AFib w/ controlled VR and he started Eliquis5Bid, and plans 2DEcho & 48H holter monitor (pending) followed by Amio vs Tikosyn and cardioversion;  He also mentioned the need for a Sleep Study (see above mention of snoring) & we discussed poss PSG testing but she tells me that her  periodontist DrLutens has made her an appliance that she wants to try first & she will let us know if & when she is readt to proceed to sleep testing... We reviewed the following medical problems during today's office visit >>     Dyspnea>  multifactorial => much improved on Klonopin 0.5mg Bid but still too sedentary (encouraged exercise program)    Cardiac issues>  HBP, HL, CAD- s/p 3 vessel CABG 0000000, DiastolicCHF, Atrial Fib (new 08/2015)> followed by DrHSmith on [id number], [id number], Bystolic5, [id number] (off prev Avapro/ Amlodipine); BP=124/78 and she notes some palpit but denies CP, dizzy, syncope, edema...    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  CT Angio Chest 05/05/15 per DrPerini> showed NEG for PE, heart size at upper lim of norm, atherosclerotic changes in Ao, no adenopathy, a few scat pulm nodules (all <19mm and unchanged from scan 2013), min basilar scarring, no abdominal findings etc...  XRays & MRI mof Tspine & Lspine in 2017>  Multifactorial sp stenosis at L3-4, and borderline impingement at C7-T1 and T10-11 due to spondylosis and  DDD, neurostimulator leads at T5-6 to T7...  IMP/PLAN>>  [name] is asked to continue the Klonopin rx;  She is under stress (son is a Pharmacist, community in [place] w/ recent abd wall hernia repair w/ complications) & this med will help w/ her dyspnea and the anxiety;  DrSmith is in the process of evaluating & treating her newly diagnosed AFib;  She wants to wait on a sleep eval & try DrLutens oral appliance first;  We plan recheck in 6 months...    ~  March 16, 2016:  6 month ROV & pulmonary follow up visit>  [name] reports a good interval w/ chr stable DOE at present;  She found the Klonopin very helpful for her chest complaints & has only been using it prn for symptoms (and it remains effective in diminishing & relieving her SOB);  DrHSmith was successful in converting her AFib to NSR/ SBrady & she is off antiarrhythmics at present (Amio was stopped);  She has had a couple of falls & went to the ER 02/02/16 w/ left rib pain after reaching for an object on the floor, losing her balance w/ resultant fall; she had tenderness under the left breast, CXR showed cardiomeg, atx & some scarring at bases, no obvious rib fxs or signs of pneumonia etc; given IS & Vicodin=> slowly improved...        Past Medical History:  Diagnosis Date  . Addison's disease ([place])   . Angina   . Anxiety   . Arthritis    "everywhere"  . Atrial fibrillation ([place])   . Chronic lower back pain   . 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  last knee surgery dr. Elmyra Ricks gave large dose of hydrocortisal and avoided the hypotensive side effectas of addison's disease.  . Coronary artery disease   . Depression   . Dysrhythmia   . GERD (gastroesophageal reflux disease)   . Hyperlipemia   . Hypertension   . Hypothyroidism   . Lumbar radiculopathy 04/27/2014  . PONV (postoperative  nausea and vomiting)   . Recurrent upper respiratory infection (URI)   . Shortness of breath     Past Surgical  History:  Procedure Laterality Date  . ACHILLES TENDON SURGERY  07/16/14   left ankle, bone spur removed  . ANTERIOR LUMBAR FUSION  1980   L4-5  . BACK SURGERY    . CARDIAC CATHETERIZATION  2013  . CARDIAC CATHETERIZATION N/A 09/02/2014   Procedure: Left Heart Cath and Coronary Angiography;  Surgeon: Wellington Hampshire, MD;  Location: [facility];  Service: Cardiovascular;  Laterality: N/A;  . CARDIOVERSION N/A 10/22/2015   Procedure: CARDIOVERSION;  Surgeon: Sanda Klein, MD;  Location: [facility];  Service: Cardiovascular;  Laterality: N/A;  . CARPAL TUNNEL RELEASE Right   . CATARACT EXTRACTION, BILATERAL Bilateral   . CORONARY ARTERY BYPASS GRAFT  06/26/2011   Procedure: CORONARY ARTERY BYPASS GRAFTING (CABG);  Surgeon: Melrose Nakayama, MD;  Location: [facility];  Service: Open Heart Surgery;  Laterality: N/A;  times using Greater Saphenous Vein Graft harvested endoscopically from right leg  . DIAGNOSTIC LAPAROSCOPY    . DILATION AND CURETTAGE OF UTERUS    . JOINT REPLACEMENT    . KNEE ARTHROSCOPY Bilateral   . LAPAROSCOPIC CHOLECYSTECTOMY    . Nerve stimulator of back  12/10/15  . REVISION TOTAL KNEE ARTHROPLASTY Right   . TONSILLECTOMY    . TOTAL KNEE ARTHROPLASTY Bilateral     Outpatient Encounter Prescriptions as of 03/16/2016  Medication Sig  . acetaminophen (TYLENOL) 500 MG tablet Take 500 mg by mouth daily as needed for headache.  [name] amLODipine (NORVASC) 5 MG tablet Take 1 tablet (5 mg total) by mouth daily.  [name] apixaban (ELIQUIS) 5 MG TABS tablet Take 1 tablet (5 mg total) by mouth 2 (two) times daily.  [name] aspirin EC 81 MG tablet Take 81 mg by mouth every Monday, Wednesday, and Friday.  . clonazePAM (KLONOPIN) 0.5 MG tablet Take 0.5-1 tablets (0.25-0.5 mg total) by mouth 2 (two) times daily as needed for anxiety.  . CYANOCOBALAMIN IJ Inject as directed every 30 (thirty) days.  [name] FLUoxetine (PROZAC) 20 MG capsule Take 20 mg by mouth every morning.  . furosemide (LASIX) 20  MG tablet Take 20 mg by mouth as needed for fluid or edema.  [name] HYDROcodone-acetaminophen (NORCO/VICODIN) 5-325 MG tablet Take 1-2 tablets by mouth every 6 (six) hours as needed for severe pain.  . hydrocortisone (CORTEF) 20 MG tablet Take 20 mg by mouth 2 (two) times daily.  . hyoscyamine (LEVSIN SL) 0.125 MG SL tablet Place 0.125 mg under the tongue daily as needed for cramping (IBS symptoms).  . irbesartan (AVAPRO) 300 MG tablet Take 300 mg by mouth daily.   [name] levothyroxine (SYNTHROID, LEVOTHROID) 88 MCG tablet Take 88 mcg by mouth daily before breakfast.  . Multiple Vitamin (MULTIVITAMIN WITH MINERALS) TABS tablet Take 1 tablet by mouth daily.  . nebivolol (BYSTOLIC) 5 MG tablet Take 2.5 mg by mouth at bedtime. Takes 2.5 mg daily  . nitroGLYCERIN (NITROSTAT) 0.4 MG SL tablet Place 0.4 mg under the tongue every 5 (five) minutes as needed for chest pain (3 DOSES MAX FOR CHEST PAIN).  [name] omega-3 acid ethyl esters (LOVAZA) 1 g capsule Take 1 g by mouth daily.  [name] omeprazole (PRILOSEC) 20 MG capsule Take 20 mg by mouth every morning.   . Probiotic Product (PROBIOTIC FORMULA PO) Take 1 capsule by mouth every morning. For upset stomach  . Vitamin D, Ergocalciferol, (DRISDOL) 50000 UNITS CAPS  Take 50,000 Units by mouth 2 (two) times a week. Takes on Tuesdays and Fridays  . [DISCONTINUED] potassium chloride SA (K-DUR,KLOR-CON) 20 MEQ tablet Take 1 tablet (20 mEq total) by mouth daily. (Patient not taking: Reported on 03/16/2016)   No facility-administered encounter medications on file as of 03/16/2016.     Allergies  Allergen Reactions  . Zanaflex [Tizanidine] Other (See Comments)    HALLUCINATIONS  . Percocet [Oxycodone-Acetaminophen] Other (See Comments)    Hallucination  . Sulfa Antibiotics Itching    Vaginal itching  . Sulfacetamide Sodium Itching    Vaginal itching    Immunization History  Administered Date(s) Administered  . Influenza Split 01/01/2014  . Influenza, High Dose Seasonal  PF 12/20/2015  . Influenza-Unspecified 01/17/2015  . Pneumococcal-Unspecified 04/03/2012  . Zoster 12/02/2013    Current Medications, Allergies, Past Medical History, Past Surgical History, Family History, and Social History were reviewed in [employer] record.   Review of Systems            All symptoms NEG except where BOLDED >>  Constitutional:  F/C/S, fatigue, anorexia, unexpected weight change. HEENT:  HA, visual changes, hearing loss, earache, nasal symptoms, sore throat, mouth sores, hoarseness. Resp:  cough, sputum, hemoptysis; SOB, tightness, wheezing. Cardio:  CP, palpit, DOE, orthopnea, edema. GI:  N/V/D/C, blood in stool; reflux, abd pain, distention, gas. GU:  dysuria, freq, urgency, hematuria, flank pain, voiding difficulty. MS:  joint pain, swelling, tenderness, decr ROM; neck pain, back pain, etc. Neuro:  HA, tremors, seizures, dizziness, syncope, weakness, numbness, gait abn. Skin:  suspicious lesions or skin rash. Heme:  adenopathy, bruising, bleeding. Psyche:  confusion, agitation, sleep disturbance, hallucinations, anxiety, depression suicidal.   Objective:   Physical Exam      Vital Signs:  Reviewed...   General:  WD, overweight, 75 y/o WF in NAD; alert & oriented; pleasant & cooperative... HEENT:  [place]/AT; Conjunctiva- pink, Sclera- nonicteric, EOM-wnl, PERRLA, EACs-clear, TMs-wnl; NOSE-clear; THROAT-clear & wnl.  Neck:  Supple w/ fair ROM; no JVD; normal carotid impulses w/o bruits; no thyromegaly or nodules palpated; no lymphadenopathy.  Chest:  Clear to P & A; without wheezes, rales, or rhonchi heard; median sternotomy scar... Heart:  irregular Rhythm; norm S1 & S2 without murmurs, rubs, or gallops detected. Abdomen:  Obese,soft & nontender- no guarding or rebound; normal bowel sounds; no organomegaly or masses palpated. Ext:  +arthritic changes & TKRs; no varicose veins, +venous insuffic, tr edema;  Pulses intact w/o bruits. Neuro:   No focal neuro deficits; gait normal & balance OK. Derm:  No lesions noted; no rash etc. Lymph:  No cervical, supraclavicular, axillary, or inguinal adenopathy palpated.   Assessment:      IMP>>  Dyspnea -- multifactorial => much improved on Klonopin 0.5mg  & dose weaned slowly... HBP, CAD- s/p 3 vessel CABG 0000000, DiastolicCHF> followed by DrHSmith... Obesity> we reviewed diet, exercise, wt reduction strategies... Severe DJD -- s/p 3 TKRs and 10 joint surgeries Chronic LBP -- eval by DrWillis & DrDalton-Bethea => s/p implanted neurostim in back 12/2014 Mult medical problems -- DM, HL, others... Sedentary -- needs incr exercise, she will re-start cardiac rehab in Jan2017...  PLAN>>  7/18>  We discussed slowing weaning the Klonopin as tolerated; she will call for any questions or problems; plan ROV in 3-4mo 11/16>  [name] continues to do well on the Klonopin, no inhalers needed; meds were adjusted by DrSmith (off Isosorbide), she is looking forward to starting cardiac rehab again... 09/15/15>   [name] is  asked to continue the Klonopin rx;  She is under stress (son is a Pharmacist, community in [place] w/ recent abd wall hernia repair w/ complications) & this med will help w/ her dyspnea and the anxiety;  DrSmith is in the process of evaluating & treating her newly diagnosed AFib;  She wants to wait on a sleep eval & try DrLutens oral appliance first;  We plan recheck in 6 months     Plan:       Patient's Medications  New Prescriptions   No medications on file  Previous Medications   ACETAMINOPHEN (TYLENOL) 500 MG TABLET    Take 500 mg by mouth daily as needed for headache.   AMLODIPINE (NORVASC) 5 MG TABLET    Take 1 tablet (5 mg total) by mouth daily.   APIXABAN (ELIQUIS) 5 MG TABS TABLET    Take 1 tablet (5 mg total) by mouth 2 (two) times daily.   ASPIRIN EC 81 MG TABLET    Take 81 mg by mouth every Monday, Wednesday, and Friday.   CLONAZEPAM (KLONOPIN) 0.5 MG TABLET    Take 0.5-1 tablets (0.25-0.5  mg total) by mouth 2 (two) times daily as needed for anxiety.   CYANOCOBALAMIN IJ    Inject as directed every 30 (thirty) days.   FLUOXETINE (PROZAC) 20 MG CAPSULE    Take 20 mg by mouth every morning.   FUROSEMIDE (LASIX) 20 MG TABLET    Take 20 mg by mouth as needed for fluid or edema.   HYDROCODONE-ACETAMINOPHEN (NORCO/VICODIN) 5-325 MG TABLET    Take 1-2 tablets by mouth every 6 (six) hours as needed for severe pain.   HYDROCORTISONE (CORTEF) 20 MG TABLET    Take 20 mg by mouth 2 (two) times daily.   HYOSCYAMINE (LEVSIN SL) 0.125 MG SL TABLET    Place 0.125 mg under the tongue daily as needed for cramping (IBS symptoms).   IRBESARTAN (AVAPRO) 300 MG TABLET    Take 300 mg by mouth daily.    LEVOTHYROXINE (SYNTHROID, LEVOTHROID) 88 MCG TABLET    Take 88 mcg by mouth daily before breakfast.   MULTIPLE VITAMIN (MULTIVITAMIN WITH MINERALS) TABS TABLET    Take 1 tablet by mouth daily.   NEBIVOLOL (BYSTOLIC) 5 MG TABLET    Take 2.5 mg by mouth at bedtime. Takes 2.5 mg daily   NITROGLYCERIN (NITROSTAT) 0.4 MG SL TABLET    Place 0.4 mg under the tongue every 5 (five) minutes as needed for chest pain (3 DOSES MAX FOR CHEST PAIN).   OMEGA-3 ACID ETHYL ESTERS (LOVAZA) 1 G CAPSULE    Take 1 g by mouth daily.   OMEPRAZOLE (PRILOSEC) 20 MG CAPSULE    Take 20 mg by mouth every morning.    PROBIOTIC PRODUCT (PROBIOTIC FORMULA PO)    Take 1 capsule by mouth every morning. For upset stomach   VITAMIN D, ERGOCALCIFEROL, (DRISDOL) 50000 UNITS CAPS    Take 50,000 Units by mouth 2 (two) times a week. Takes on Tuesdays and Fridays  Modified Medications   No medications on file  Discontinued Medications   POTASSIUM CHLORIDE SA (K-DUR,KLOR-CON) 20 MEQ TABLET    Take 1 tablet (20 mEq total) by mouth daily.

## 2016-03-22 ENCOUNTER — Emergency Department (HOSPITAL_COMMUNITY): Payer: Medicare Other

## 2016-03-22 ENCOUNTER — Encounter (HOSPITAL_COMMUNITY): Payer: Self-pay

## 2016-03-22 ENCOUNTER — Inpatient Hospital Stay (HOSPITAL_COMMUNITY)
Admission: EM | Admit: 2016-03-22 | Discharge: 2016-03-25 | DRG: 202 | Disposition: A | Discharge status: Home / Self Care | Payer: Medicare Other | Attending: Internal Medicine | Admitting: Internal Medicine

## 2016-03-22 DIAGNOSIS — I4891 Unspecified atrial fibrillation: Secondary | ICD-10-CM | POA: Diagnosis present

## 2016-03-22 DIAGNOSIS — J40 Bronchitis, not specified as acute or chronic: Secondary | ICD-10-CM | POA: Diagnosis not present

## 2016-03-22 DIAGNOSIS — E785 Hyperlipidemia, unspecified: Secondary | ICD-10-CM | POA: Diagnosis present

## 2016-03-22 DIAGNOSIS — M1990 Unspecified osteoarthritis, unspecified site: Secondary | ICD-10-CM | POA: Diagnosis present

## 2016-03-22 DIAGNOSIS — E039 Hypothyroidism, unspecified: Secondary | ICD-10-CM | POA: Diagnosis present

## 2016-03-22 DIAGNOSIS — G8929 Other chronic pain: Secondary | ICD-10-CM | POA: Diagnosis present

## 2016-03-22 DIAGNOSIS — Z888 Allergy status to other drugs, medicaments and biological substances status: Secondary | ICD-10-CM

## 2016-03-22 DIAGNOSIS — Z8249 Family history of ischemic heart disease and other diseases of the circulatory system: Secondary | ICD-10-CM

## 2016-03-22 DIAGNOSIS — I25709 Atherosclerosis of coronary artery bypass graft(s), unspecified, with unspecified angina pectoris: Secondary | ICD-10-CM | POA: Diagnosis present

## 2016-03-22 DIAGNOSIS — K219 Gastro-esophageal reflux disease without esophagitis: Secondary | ICD-10-CM | POA: Diagnosis present

## 2016-03-22 DIAGNOSIS — Z96653 Presence of artificial knee joint, bilateral: Secondary | ICD-10-CM | POA: Diagnosis present

## 2016-03-22 DIAGNOSIS — Z981 Arthrodesis status: Secondary | ICD-10-CM

## 2016-03-22 DIAGNOSIS — I2510 Atherosclerotic heart disease of native coronary artery without angina pectoris: Secondary | ICD-10-CM | POA: Diagnosis present

## 2016-03-22 DIAGNOSIS — Z7901 Long term (current) use of anticoagulants: Secondary | ICD-10-CM

## 2016-03-22 DIAGNOSIS — J9601 Acute respiratory failure with hypoxia: Secondary | ICD-10-CM | POA: Diagnosis not present

## 2016-03-22 DIAGNOSIS — Z951 Presence of aortocoronary bypass graft: Secondary | ICD-10-CM

## 2016-03-22 DIAGNOSIS — I10 Essential (primary) hypertension: Secondary | ICD-10-CM | POA: Diagnosis present

## 2016-03-22 DIAGNOSIS — E875 Hyperkalemia: Secondary | ICD-10-CM | POA: Diagnosis not present

## 2016-03-22 DIAGNOSIS — M549 Dorsalgia, unspecified: Secondary | ICD-10-CM

## 2016-03-22 DIAGNOSIS — J209 Acute bronchitis, unspecified: Secondary | ICD-10-CM | POA: Diagnosis not present

## 2016-03-22 DIAGNOSIS — E0590 Thyrotoxicosis, unspecified without thyrotoxic crisis or storm: Secondary | ICD-10-CM | POA: Diagnosis present

## 2016-03-22 DIAGNOSIS — R112 Nausea with vomiting, unspecified: Secondary | ICD-10-CM

## 2016-03-22 DIAGNOSIS — E271 Primary adrenocortical insufficiency: Secondary | ICD-10-CM | POA: Diagnosis present

## 2016-03-22 DIAGNOSIS — Z7982 Long term (current) use of aspirin: Secondary | ICD-10-CM

## 2016-03-22 DIAGNOSIS — Z882 Allergy status to sulfonamides status: Secondary | ICD-10-CM

## 2016-03-22 DIAGNOSIS — R0602 Shortness of breath: Secondary | ICD-10-CM | POA: Diagnosis not present

## 2016-03-22 DIAGNOSIS — M545 Low back pain: Secondary | ICD-10-CM | POA: Diagnosis present

## 2016-03-22 DIAGNOSIS — E876 Hypokalemia: Secondary | ICD-10-CM

## 2016-03-22 DIAGNOSIS — R0902 Hypoxemia: Secondary | ICD-10-CM

## 2016-03-22 DIAGNOSIS — Z79899 Other long term (current) drug therapy: Secondary | ICD-10-CM

## 2016-03-22 LAB — COMPREHENSIVE METABOLIC PANEL
ALBUMIN: 3.3 g/dL — AB (ref 3.5–5.0)
ALT: 21 U/L (ref 14–54)
ANION GAP: 12 (ref 5–15)
AST: 32 U/L (ref 15–41)
Alkaline Phosphatase: 64 U/L (ref 38–126)
BUN: 10 mg/dL (ref 6–20)
CHLORIDE: 100 mmol/L — AB (ref 101–111)
CO2: 24 mmol/L (ref 22–32)
Calcium: 8.3 mg/dL — ABNORMAL LOW (ref 8.9–10.3)
Creatinine, Ser: 1.01 mg/dL — ABNORMAL HIGH (ref 0.44–1.00)
GFR calc Af Amer: >60 mL/min (ref >60)
GFR, EST NON AFRICAN AMERICAN: 53 mL/min — AB (ref >60)
Glucose, Bld: 202 mg/dL — ABNORMAL HIGH (ref 65–99)
POTASSIUM: 3.2 mmol/L — AB (ref 3.5–5.1)
Sodium: 136 mmol/L (ref 135–145)
TOTAL PROTEIN: 6.3 g/dL — AB (ref 6.5–8.1)
Total Bilirubin: 0.9 mg/dL (ref 0.3–1.2)

## 2016-03-22 LAB — I-STAT TROPONIN, ED: TROPONIN I, POC: 0.01 ng/mL (ref 0.00–0.08)

## 2016-03-22 LAB — CBC
HCT: 39.7 % (ref 36.0–46.0)
HEMOGLOBIN: 13.3 g/dL (ref 12.0–15.0)
MCH: 30.5 pg (ref 26.0–34.0)
MCHC: 33.5 g/dL (ref 30.0–36.0)
MCV: 91.1 fL (ref 78.0–100.0)
PLATELETS: 204 10*3/uL (ref 150–400)
RBC: 4.36 MIL/uL (ref 3.87–5.11)
RDW: 13 % (ref 11.5–15.5)
WBC: 8.5 10*3/uL (ref 4.0–10.5)

## 2016-03-22 LAB — BASIC METABOLIC PANEL
Anion gap: 11 (ref 5–15)
BUN: 9 mg/dL (ref 6–20)
CALCIUM: 8.4 mg/dL — AB (ref 8.9–10.3)
CHLORIDE: 101 mmol/L (ref 101–111)
CO2: 26 mmol/L (ref 22–32)
CREATININE: 0.83 mg/dL (ref 0.44–1.00)
GFR calc non Af Amer: >60 mL/min (ref >60)
Glucose, Bld: 123 mg/dL — ABNORMAL HIGH (ref 65–99)
Potassium: 3.4 mmol/L — ABNORMAL LOW (ref 3.5–5.1)
SODIUM: 138 mmol/L (ref 135–145)

## 2016-03-22 LAB — BRAIN NATRIURETIC PEPTIDE: B Natriuretic Peptide: 450.4 pg/mL — ABNORMAL HIGH (ref 0.0–100.0)

## 2016-03-22 MED ORDER — GUAIFENESIN ER 600 MG PO TB12
1200.0000 mg | ORAL_TABLET | Freq: Two times a day (BID) | ORAL | Status: DC
  Administered 2016-03-22 – 2016-03-25 (×6): 1200 mg via ORAL
  Filled 2016-03-22 (×6): qty 2

## 2016-03-22 MED ORDER — IPRATROPIUM BROMIDE 0.02 % IN SOLN
0.5000 mg | Freq: Once | RESPIRATORY_TRACT | Status: AC
  Administered 2016-03-22: 0.5 mg via RESPIRATORY_TRACT
  Filled 2016-03-22: qty 2.5

## 2016-03-22 MED ORDER — SACCHAROMYCES BOULARDII 250 MG PO CAPS
250.0000 mg | ORAL_CAPSULE | Freq: Every day | ORAL | Status: DC
  Administered 2016-03-22 – 2016-03-25 (×4): 250 mg via ORAL
  Filled 2016-03-22 (×4): qty 1

## 2016-03-22 MED ORDER — NEBIVOLOL HCL 2.5 MG PO TABS
2.5000 mg | ORAL_TABLET | Freq: Every day | ORAL | Status: DC
  Administered 2016-03-22 – 2016-03-24 (×3): 2.5 mg via ORAL
  Filled 2016-03-22 (×3): qty 1

## 2016-03-22 MED ORDER — LEVOTHYROXINE SODIUM 88 MCG PO TABS
88.0000 ug | ORAL_TABLET | Freq: Every day | ORAL | Status: DC
  Administered 2016-03-22 – 2016-03-25 (×5): 88 ug via ORAL
  Filled 2016-03-22 (×5): qty 1

## 2016-03-22 MED ORDER — CLONAZEPAM 0.5 MG PO TABS
0.2500 mg | ORAL_TABLET | Freq: Two times a day (BID) | ORAL | Status: DC | PRN
  Administered 2016-03-22 – 2016-03-24 (×5): 0.25 mg via ORAL
  Filled 2016-03-22 (×5): qty 1

## 2016-03-22 MED ORDER — ONDANSETRON HCL 4 MG/2ML IJ SOLN
4.0000 mg | Freq: Once | INTRAMUSCULAR | Status: AC
  Administered 2016-03-22: 4 mg via INTRAVENOUS
  Filled 2016-03-22: qty 2

## 2016-03-22 MED ORDER — ZOLPIDEM TARTRATE 5 MG PO TABS: 5.0000 mg | ORAL_TABLET | Freq: Every evening | ORAL | Status: DC | PRN

## 2016-03-22 MED ORDER — HYDROCODONE-ACETAMINOPHEN 5-325 MG PO TABS: 1.0000 | ORAL_TABLET | Freq: Four times a day (QID) | ORAL | Status: DC | PRN

## 2016-03-22 MED ORDER — HYOSCYAMINE SULFATE 0.125 MG SL SUBL: 0.1250 mg | SUBLINGUAL_TABLET | Freq: Every day | SUBLINGUAL | Status: DC | PRN

## 2016-03-22 MED ORDER — FLUOXETINE HCL 20 MG PO CAPS
20.0000 mg | ORAL_CAPSULE | Freq: Every morning | ORAL | Status: DC
  Administered 2016-03-22 – 2016-03-25 (×4): 20 mg via ORAL
  Filled 2016-03-22 (×4): qty 1

## 2016-03-22 MED ORDER — IPRATROPIUM-ALBUTEROL 0.5-2.5 (3) MG/3ML IN SOLN
3.0000 mL | Freq: Four times a day (QID) | RESPIRATORY_TRACT | Status: DC
  Administered 2016-03-22 (×2): 3 mL via RESPIRATORY_TRACT
  Filled 2016-03-22 (×2): qty 3

## 2016-03-22 MED ORDER — FUROSEMIDE 10 MG/ML IJ SOLN
60.0000 mg | Freq: Once | INTRAMUSCULAR | Status: AC
  Administered 2016-03-22: 60 mg via INTRAVENOUS
  Filled 2016-03-22: qty 6

## 2016-03-22 MED ORDER — ACETAMINOPHEN 500 MG PO TABS
1000.0000 mg | ORAL_TABLET | Freq: Once | ORAL | Status: AC
  Administered 2016-03-22: 1000 mg via ORAL
  Filled 2016-03-22: qty 2

## 2016-03-22 MED ORDER — ONDANSETRON HCL 4 MG/2ML IJ SOLN: 4.0000 mg | Freq: Four times a day (QID) | INTRAMUSCULAR | Status: DC | PRN

## 2016-03-22 MED ORDER — PANTOPRAZOLE SODIUM 40 MG PO TBEC
40.0000 mg | DELAYED_RELEASE_TABLET | Freq: Every day | ORAL | Status: DC
  Administered 2016-03-22 – 2016-03-25 (×4): 40 mg via ORAL
  Filled 2016-03-22 (×4): qty 1

## 2016-03-22 MED ORDER — ONDANSETRON HCL 4 MG PO TABS: 4.0000 mg | ORAL_TABLET | Freq: Four times a day (QID) | ORAL | Status: DC | PRN

## 2016-03-22 MED ORDER — ONDANSETRON 4 MG PO TBDP
4.0000 mg | ORAL_TABLET | Freq: Once | ORAL | Status: AC
Start: 2016-03-22 — End: 2016-03-22
  Administered 2016-03-22: 4 mg via ORAL
  Filled 2016-03-22: qty 1

## 2016-03-22 MED ORDER — VITAMIN D (ERGOCALCIFEROL) 1.25 MG (50000 UNIT) PO CAPS
50000.0000 [IU] | ORAL_CAPSULE | ORAL | Status: DC
  Administered 2016-03-23: 50000 [IU] via ORAL
  Filled 2016-03-22: qty 1

## 2016-03-22 MED ORDER — METHYLPREDNISOLONE SODIUM SUCC 125 MG IJ SOLR
60.0000 mg | Freq: Four times a day (QID) | INTRAMUSCULAR | Status: DC
  Administered 2016-03-22 – 2016-03-23 (×4): 60 mg via INTRAVENOUS
  Filled 2016-03-22 (×5): qty 2

## 2016-03-22 MED ORDER — DOXYCYCLINE HYCLATE 100 MG IV SOLR
100.0000 mg | Freq: Two times a day (BID) | INTRAVENOUS | Status: DC
  Administered 2016-03-22 – 2016-03-25 (×6): 100 mg via INTRAVENOUS
  Filled 2016-03-22 (×7): qty 100

## 2016-03-22 MED ORDER — DOXYCYCLINE HYCLATE 100 MG PO TABS
100.0000 mg | ORAL_TABLET | Freq: Once | ORAL | Status: AC
  Administered 2016-03-22: 100 mg via ORAL
  Filled 2016-03-22: qty 1

## 2016-03-22 MED ORDER — SODIUM CHLORIDE 0.9% FLUSH
3.0000 mL | Freq: Two times a day (BID) | INTRAVENOUS | Status: DC
  Administered 2016-03-22 – 2016-03-25 (×7): 3 mL via INTRAVENOUS

## 2016-03-22 MED ORDER — ASPIRIN EC 81 MG PO TBEC
81.0000 mg | DELAYED_RELEASE_TABLET | ORAL | Status: DC
  Administered 2016-03-22 – 2016-03-24 (×2): 81 mg via ORAL
  Filled 2016-03-22 (×3): qty 1

## 2016-03-22 MED ORDER — POTASSIUM CHLORIDE CRYS ER 20 MEQ PO TBCR
40.0000 meq | EXTENDED_RELEASE_TABLET | Freq: Once | ORAL | Status: AC
  Administered 2016-03-22: 40 meq via ORAL
  Filled 2016-03-22: qty 2

## 2016-03-22 MED ORDER — ALBUTEROL SULFATE (2.5 MG/3ML) 0.083% IN NEBU
5.0000 mg | INHALATION_SOLUTION | Freq: Once | RESPIRATORY_TRACT | Status: AC
  Administered 2016-03-22: 5 mg via RESPIRATORY_TRACT
  Filled 2016-03-22: qty 6

## 2016-03-22 MED ORDER — APIXABAN 5 MG PO TABS
5.0000 mg | ORAL_TABLET | Freq: Two times a day (BID) | ORAL | Status: DC
  Administered 2016-03-22 – 2016-03-25 (×6): 5 mg via ORAL
  Filled 2016-03-22 (×6): qty 1

## 2016-03-22 MED ORDER — IPRATROPIUM-ALBUTEROL 0.5-2.5 (3) MG/3ML IN SOLN
3.0000 mL | RESPIRATORY_TRACT | Status: DC | PRN
  Administered 2016-03-23 – 2016-03-24 (×4): 3 mL via RESPIRATORY_TRACT
  Filled 2016-03-22 (×4): qty 3

## 2016-03-22 MED ORDER — OMEGA-3-ACID ETHYL ESTERS 1 G PO CAPS
1.0000 g | ORAL_CAPSULE | Freq: Every day | ORAL | Status: DC
Start: 2016-03-22 — End: 2016-03-25
  Administered 2016-03-22 – 2016-03-25 (×3): 1 g via ORAL
  Filled 2016-03-22 (×4): qty 1

## 2016-03-22 MED ORDER — IRBESARTAN 300 MG PO TABS
300.0000 mg | ORAL_TABLET | Freq: Every day | ORAL | Status: DC
  Administered 2016-03-22 – 2016-03-25 (×4): 300 mg via ORAL
  Filled 2016-03-22 (×4): qty 1

## 2016-03-22 MED ORDER — AMLODIPINE BESYLATE 5 MG PO TABS
5.0000 mg | ORAL_TABLET | Freq: Every day | ORAL | Status: DC
  Administered 2016-03-23 – 2016-03-25 (×3): 5 mg via ORAL
  Filled 2016-03-22 (×4): qty 1

## 2016-03-22 MED ORDER — ACETAMINOPHEN 500 MG PO TABS: 500.0000 mg | ORAL_TABLET | Freq: Every day | ORAL | Status: DC | PRN

## 2016-03-22 MED ORDER — METHYLPREDNISOLONE SODIUM SUCC 125 MG IJ SOLR
125.0000 mg | Freq: Once | INTRAMUSCULAR | Status: AC
  Administered 2016-03-22: 125 mg via INTRAVENOUS
  Filled 2016-03-22: qty 2

## 2016-03-22 MED ORDER — NITROGLYCERIN 0.4 MG SL SUBL: 0.4000 mg | SUBLINGUAL_TABLET | SUBLINGUAL | Status: DC | PRN

## 2016-03-22 MED ORDER — POTASSIUM CHLORIDE CRYS ER 20 MEQ PO TBCR
20.0000 meq | EXTENDED_RELEASE_TABLET | Freq: Two times a day (BID) | ORAL | Status: AC
  Administered 2016-03-22 (×2): 20 meq via ORAL
  Filled 2016-03-22 (×2): qty 1

## 2016-03-22 MED ORDER — POLYETHYLENE GLYCOL 3350 17 G PO PACK
17.0000 g | PACK | Freq: Every day | ORAL | Status: DC | PRN
  Administered 2016-03-24: 17 g via ORAL
  Filled 2016-03-22: qty 1

## 2016-03-22 NOTE — ED Notes (Signed)
Pt noted as vomiting small amount.

## 2016-03-22 NOTE — ED Provider Notes (Signed)
[facility] DEPT Provider Note   CSN: [id number] Arrival date & time: 03/22/16  [id number]     History   Chief Complaint Chief Complaint  Patient presents with  . Shortness of Breath    HPI [name] is a 75 y.o. female.  HPI  Pt presenting with c/o shortness of breath.  She states that 4 days ago she started having nasal and chest congestion.  She was started on zithromax by PMD- today would be 3rd and final dose.  This has not helped.  Last night she felt that there was so much congestion it was making it hard for her to breathe, so called EMS.  No chest pain.  No fever.  She has had no vomiting or diarrhea, she does feel her mouth is dry but has been drinking some liquids.  No hx of smoking   Past Medical History:  Diagnosis Date  . Addison's disease ([place])   . Angina   . Anxiety   . Arthritis    "everywhere"  . Atrial fibrillation ([place])   . Chronic lower back pain   . 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  last knee surgery dr. Elmyra Ricks gave large dose of hydrocortisal and avoided the hypotensive side effectas of addison's disease.  . Coronary artery disease   . Depression   . Dysrhythmia   . GERD (gastroesophageal reflux disease)   . Hyperlipemia   . Hypertension   . Hypothyroidism   . Lumbar radiculopathy 04/27/2014  . PONV (postoperative nausea and vomiting)   . Recurrent upper respiratory infection (URI)   . Shortness of breath     Patient Active Problem List   Diagnosis Date Noted  . Bronchitis 03/22/2016  . Abnormal CT of the chest 03/16/2016  . CAD (coronary artery disease) 03/16/2016  . Persistent atrial fibrillation ([place])   . Chronic anticoagulation 10/06/2015  . On amiodarone therapy 10/06/2015  . Atrial fibrillation ([place]) 09/15/2015  . Snoring 02/17/2015  . Addison disease ([place]) 09/30/2014  . Chronic back pain 09/30/2014  . Obesity-BMI 35 09/30/2014  . Hematochezia-(Hgb stable) 09/03/2014  .  DOE (dyspnea on exertion) 09/01/2014  . Lumbar radiculopathy 04/27/2014  . UTI (lower urinary tract infection) 07/09/2011  . Leukocytosis, unspecified 07/08/2011  . Hypokalemia 07/08/2011  . Nausea and vomiting 07/08/2011  . 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 Multiple pulmonary nodules determined by computed tomography of lung 11/18/2008  . Osteoarthritis 11/18/2008  . Hyperlipidemia with target LDL less than 70 11/17/2008  . Essential hypertension 11/17/2008    Past Surgical History:  Procedure Laterality Date  . ACHILLES TENDON SURGERY  07/16/14   left ankle, bone spur removed  . ANTERIOR LUMBAR FUSION  1980   L4-5  . BACK SURGERY    . CARDIAC CATHETERIZATION  2013  . CARDIAC CATHETERIZATION N/A 09/02/2014   Procedure: Left Heart Cath and Coronary Angiography;  Surgeon: Wellington Hampshire, MD;  Location: [facility];  Service: Cardiovascular;  Laterality: N/A;  . CARDIOVERSION N/A 10/22/2015   Procedure: CARDIOVERSION;  Surgeon: Sanda Klein, MD;  Location: [facility];  Service: Cardiovascular;  Laterality: N/A;  . CARPAL TUNNEL RELEASE Right   . CATARACT EXTRACTION, BILATERAL Bilateral   . CORONARY ARTERY BYPASS GRAFT  06/26/2011   Procedure: CORONARY ARTERY BYPASS GRAFTING (CABG);  Surgeon: Melrose Nakayama, MD;  Location: [facility];  Service: Open Heart Surgery;  Laterality: N/A;  times using Greater Saphenous  Vein Graft harvested endoscopically from right leg  . DIAGNOSTIC LAPAROSCOPY    . DILATION AND CURETTAGE OF UTERUS    . JOINT REPLACEMENT    . KNEE ARTHROSCOPY Bilateral   . LAPAROSCOPIC CHOLECYSTECTOMY    . Nerve stimulator of back  12/10/15  . REVISION TOTAL KNEE ARTHROPLASTY Right   . TONSILLECTOMY    . TOTAL KNEE ARTHROPLASTY Bilateral     OB History    No data available       Home Medications    Prior to Admission medications   Medication Sig Start Date End Date Taking? Authorizing Provider  acetaminophen (TYLENOL) 500 MG tablet  Take 500 mg by mouth daily as needed for headache.   Yes Historical Provider, MD  amLODipine (NORVASC) 5 MG tablet Take 1 tablet (5 mg total) by mouth daily. 09/24/15  Yes Isaiah Serge, NP  apixaban (ELIQUIS) 5 MG TABS tablet Take 1 tablet (5 mg total) by mouth 2 (two) times daily. 09/02/15  Yes Belva Crome, MD  aspirin EC 81 MG tablet Take 81 mg by mouth every Monday, Wednesday, and Friday.   Yes Historical Provider, MD  clonazePAM (KLONOPIN) 0.5 MG tablet Take 0.5-1 tablets (0.25-0.5 mg total) by mouth 2 (two) times daily as needed for anxiety. 09/07/14  Yes Noralee Space, MD  CYANOCOBALAMIN IJ Inject as directed every 30 (thirty) days.   Yes Historical Provider, MD  FLUoxetine (PROZAC) 20 MG capsule Take 20 mg by mouth every morning.   Yes Historical Provider, MD  furosemide (LASIX) 20 MG tablet Take 20 mg by mouth as needed for fluid or edema.   Yes Historical Provider, MD  HYDROcodone-acetaminophen (NORCO/VICODIN) 5-325 MG tablet Take 1-2 tablets by mouth every 6 (six) hours as needed for severe pain. 02/02/16  Yes Sharlett Iles, MD  hydrocortisone (CORTEF) 20 MG tablet Take 20 mg by mouth 2 (two) times daily.   Yes Historical Provider, MD  hyoscyamine (LEVSIN SL) 0.125 MG SL tablet Place 0.125 mg under the tongue daily as needed for cramping (IBS symptoms).   Yes Historical Provider, MD  irbesartan (AVAPRO) 300 MG tablet Take 300 mg by mouth daily.  09/17/15  Yes Historical Provider, MD  levothyroxine (SYNTHROID, LEVOTHROID) 88 MCG tablet Take 88 mcg by mouth daily before breakfast.   Yes Historical Provider, MD  Multiple Vitamin (MULTIVITAMIN WITH MINERALS) TABS tablet Take 1 tablet by mouth daily.   Yes Historical Provider, MD  nebivolol (BYSTOLIC) 5 MG tablet Take 2.5 mg by mouth at bedtime. Takes 2.5 mg daily   Yes Historical Provider, MD  nitroGLYCERIN (NITROSTAT) 0.4 MG SL tablet Place 0.4 mg under the tongue every 5 (five) minutes as needed for chest pain (3 DOSES MAX FOR CHEST PAIN).    Yes Historical Provider, MD  omega-3 acid ethyl esters (LOVAZA) 1 g capsule Take 1 g by mouth daily.   Yes Historical Provider, MD  omeprazole (PRILOSEC) 20 MG capsule Take 20 mg by mouth every morning.  11/19/13  Yes Historical Provider, MD  Probiotic Product (PROBIOTIC FORMULA PO) Take 1 capsule by mouth every morning. For upset stomach   Yes Historical Provider, MD  Vitamin D, Ergocalciferol, (DRISDOL) 50000 UNITS CAPS Take 50,000 Units by mouth 2 (two) times a week. Takes on Tuesdays and Fridays   Yes Historical Provider, MD    Family History Family History  Problem Relation Age of Onset  . Heart attack Father   . Hypertension Mother   . Stroke Mother   .  Hypertension Maternal Grandfather   . Colon cancer Neg Hx     Social History Social History  Substance Use Topics  . Smoking status: Passive Smoke Exposure - Never Smoker    Packs/day: 0.00    Years: 0.00  . Smokeless tobacco: Never Used  . Alcohol use No     Allergies   Zanaflex [tizanidine]; Percocet [oxycodone-acetaminophen]; Sulfa antibiotics; and Sulfacetamide sodium   Review of Systems Review of Systems  ROS reviewed and all otherwise negative except for mentioned in HPI   Physical Exam Updated Vital Signs BP (!) 113/44 (BP Location: Left Arm)   Pulse 70   Temp 97.6 F (36.4 C) (Oral)   Resp 20   Ht 5\' 4"  (1.626 m)   Wt 90.3 kg   SpO2 97%   BMI 34.16 kg/m  Vitals reviewed Physical Exam Physical Examination: General appearance - alert, well appearing, and in no distress Mental status - alert, oriented to person, place, and time Eyes - no conjunctival injection, no scleral icterus Mouth - mucous membranes dry Chest - BSS, good air movement, bilateral expiratory wheezing at bases, no crackles, normal respiratory effort Heart - normal rate, regular rhythm, normal S1, S2, no murmurs, rubs, clicks or gallops Abdomen - soft, nontender, nondistended, no masses or organomegaly Neurological - alert,  orientedx  3 Extremities - peripheral pulses normal, no pedal edema, no clubbing or cyanosis Skin - normal coloration and turgor, no rashes  ED Treatments / Results  Labs (all labs ordered are listed, but only abnormal results are displayed) Labs Reviewed  BASIC METABOLIC PANEL - Abnormal; Notable for the following:       Result Value   Potassium 3.4 (*)    Glucose, Bld 123 (*)    Calcium 8.4 (*)    All other components within normal limits  BRAIN NATRIURETIC PEPTIDE - Abnormal; Notable for the following:    B Natriuretic Peptide 450.4 (*)    All other components within normal limits  COMPREHENSIVE METABOLIC PANEL - Abnormal; Notable for the following:    Potassium 3.2 (*)    Chloride 100 (*)    Glucose, Bld 202 (*)    Creatinine, Ser 1.01 (*)    Calcium 8.3 (*)    Total Protein 6.3 (*)    Albumin 3.3 (*)    GFR calc non Af Amer 53 (*)    All other components within normal limits  BASIC METABOLIC PANEL - Abnormal; Notable for the following:    Glucose, Bld 191 (*)    All other components within normal limits  CBC - Abnormal; Notable for the following:    WBC 13.2 (*)    All other components within normal limits  RESPIRATORY PANEL BY PCR  CBC  I-STAT TROPOININ, ED    EKG  EKG Interpretation  Date/Time:  Wednesday March 22 2016 07:39:31 EST Ventricular Rate:  72 PR Interval:    QRS Duration: 123 QT Interval:  437 QTC Calculation: 479 R Axis:   84 Text Interpretation:  Sinus or ectopic atrial rhythm IVCD, consider atypical RBBB No significant change since last tracing Confirmed by [facility]  MD, Nellie Pester [phone]) on 03/22/2016 8:16:11 AM       Radiology Dg Chest 2 View  Result Date: 03/22/2016 CLINICAL DATA:  Short of breath EXAM: CHEST  2 VIEW COMPARISON:  02/02/2016 FINDINGS: Cardiac enlargement.  CABG.  Negative for heart failure. Mild patchy bibasilar atelectasis. No definite pneumonia. Negative for pleural effusion. Spinal cord stimulator unchanged in the  thoracic  spine IMPRESSION: Mild bibasilar atelectasis. Negative for heart failure or pneumonia. Electronically Signed   By: Franchot Gallo M.D.   On: 03/22/2016 08:55    Procedures Procedures (including critical care time)  Medications Ordered in ED Medications  hyoscyamine (LEVSIN SL) SL tablet 0.125 mg (not administered)  HYDROcodone-acetaminophen (NORCO/VICODIN) 5-325 MG per tablet 1-2 tablet (not administered)  levothyroxine (SYNTHROID, LEVOTHROID) tablet 88 mcg (88 mcg Oral Given 03/23/16 0804)  omega-3 acid ethyl esters (LOVAZA) capsule 1 g (1 g Oral Given 03/22/16 1601)  amLODipine (NORVASC) tablet 5 mg (5 mg Oral Not Given 03/22/16 1608)  irbesartan (AVAPRO) tablet 300 mg (300 mg Oral Given 03/22/16 1750)  nitroGLYCERIN (NITROSTAT) SL tablet 0.4 mg (not administered)  apixaban (ELIQUIS) tablet 5 mg (5 mg Oral Given 03/22/16 1751)  clonazePAM (KLONOPIN) tablet 0.25 mg (0.25 mg Oral Given 03/23/16 0603)  acetaminophen (TYLENOL) tablet 500 mg (not administered)  pantoprazole (PROTONIX) EC tablet 40 mg (40 mg Oral Given 03/22/16 1602)  Vitamin D (Ergocalciferol) (DRISDOL) capsule 50,000 Units (not administered)  saccharomyces boulardii (FLORASTOR) capsule 250 mg (250 mg Oral Given 03/22/16 1750)  aspirin EC tablet 81 mg (81 mg Oral Given 03/22/16 1603)  FLUoxetine (PROZAC) capsule 20 mg (20 mg Oral Given 03/22/16 1603)  nebivolol (BYSTOLIC) tablet 2.5 mg (2.5 mg Oral Given 03/22/16 2126)  sodium chloride flush (NS) 0.9 % injection 3 mL (3 mLs Intravenous Given 03/22/16 2126)  zolpidem (AMBIEN) tablet 5 mg (not administered)  polyethylene glycol (MIRALAX / GLYCOLAX) packet 17 g (not administered)  ondansetron (ZOFRAN) tablet 4 mg (not administered)    Or  ondansetron (ZOFRAN) injection 4 mg (not administered)  doxycycline (VIBRAMYCIN) 100 mg in dextrose 5 % 250 mL IVPB (100 mg Intravenous New Bag/Given 03/23/16 0200)  methylPREDNISolone sodium succinate (SOLU-MEDROL) 125 mg/2 mL  injection 60 mg (60 mg Intravenous Given 03/23/16 0804)  guaiFENesin (MUCINEX) 12 hr tablet 1,200 mg (1,200 mg Oral Given 03/22/16 2126)  ipratropium-albuterol (DUONEB) 0.5-2.5 (3) MG/3ML nebulizer solution 3 mL (3 mLs Nebulization Given 03/23/16 0546)  albuterol (PROVENTIL) (2.5 MG/3ML) 0.083% nebulizer solution 5 mg (5 mg Nebulization Given 03/22/16 0819)  albuterol (PROVENTIL) (2.5 MG/3ML) 0.083% nebulizer solution 5 mg (5 mg Nebulization Given 03/22/16 0949)  ipratropium (ATROVENT) nebulizer solution 0.5 mg (0.5 mg Nebulization Given 03/22/16 0949)  methylPREDNISolone sodium succinate (SOLU-MEDROL) 125 mg/2 mL injection 125 mg (125 mg Intravenous Given 03/22/16 0949)  doxycycline (VIBRA-TABS) tablet 100 mg (100 mg Oral Given 03/22/16 0949)  potassium chloride SA (K-DUR,KLOR-CON) CR tablet 40 mEq (40 mEq Oral Given 03/22/16 0949)  acetaminophen (TYLENOL) tablet 1,000 mg (1,000 mg Oral Given 03/22/16 0949)  ondansetron (ZOFRAN-ODT) disintegrating tablet 4 mg (4 mg Oral Given 03/22/16 1124)  ondansetron (ZOFRAN) injection 4 mg (4 mg Intravenous Given 03/22/16 1220)  furosemide (LASIX) injection 60 mg (60 mg Intravenous Given 03/22/16 1609)  potassium chloride SA (K-DUR,KLOR-CON) CR tablet 20 mEq (20 mEq Oral Given 03/22/16 2126)     Initial Impression / Assessment and Plan / ED Course  I have reviewed the triage vital signs and the nursing notes.  Pertinent labs & imaging results that were available during my care of the patient were reviewed by me and considered in my medical decision making (see chart for details).  Clinical Course   11:56 AM wheezing was improving after nebs.  Pt developed some vomiting- treated with zofran- will recheck- if she remains hypoxic and cannot tolerate po fluids will need admission.    12:50 PM pt continues to require  some oxygen, she has been able to tolerate po fluids in the ED.  D/w hosptialist for admission- to observation, telemetry, DR. Merrell. Pt is  agreeable with this plan.      Final Clinical Impressions(s) / ED Diagnoses   Final diagnoses:  Bronchitis  Hypoxia  Nausea and vomiting, intractability of vomiting not specified, unspecified vomiting type    New Prescriptions Current Discharge Medication List       Alfonzo Beers, MD 03/23/16 [phone]

## 2016-03-22 NOTE — H&P (Signed)
History and Physical    [name] [id number] DOB: March 09, 1941 DOA: 03/22/2016  PCP: Jerlyn Ly, MD  Chief Complaint: SOB, chest congestion and cough with yellowish sputumm SOB   HPI: [name] is a 75 y.o. female with medical history significant of CAD, s/p CABG, known AFib on anticoagulation therapy with Eliquis, hypothyroidism, hypertension, GERD who developed bronchitis last Friday. Patient is prone to recurrent upper respiratory infections that occur every winter and she contacted her PCP who prescribed a three-day pack of azithromycin. She completed of the course of antimicrobial therapy but his symptoms did not improve. Last night patient was unable to sleep due to increased chest congestion and coughing bringing up yellowish phlegm and eventually her shortness of breath became so severe that she had to call EMS and was delivered to the emergency department. She denied chest pain, palpitations, but complained of nausea that occurred this morning in the emergency department and prior to calling EMS she was having shaking chills at home  ED Course: In the ED patient had fever of 100.13F and was given Tylenol after which her temperature normalized. The rest of the vital signs were unremarkable. Her blood work showed normal troponin, but mildly elevated BNP to 450. Chest x-ray didn't show any signs of CHF but revealed cardiomegaly and otherwise no acute changes BMP showed a mild hypokalemia with potassium of 3.4 CBC showed normal white blood cells count normal platelets and hemoglobin   Review of Systems: As per HPI otherwise 10 point review of systems negative.   Ambulatory Status: Independent  Past Medical History:  Diagnosis Date  . Addison's disease ([place])   . Angina   . Anxiety   . Arthritis    "everywhere"  . Atrial fibrillation ([place])   . Chronic lower back pain   . 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  last  knee surgery dr. Elmyra Ricks gave large dose of hydrocortisal and avoided the hypotensive side effectas of addison's disease.  . Coronary artery disease   . Depression   . Dysrhythmia   . GERD (gastroesophageal reflux disease)   . Hyperlipemia   . Hypertension   . Hypothyroidism   . Lumbar radiculopathy 04/27/2014  . PONV (postoperative nausea and vomiting)   . Recurrent upper respiratory infection (URI)   . Shortness of breath     Past Surgical History:  Procedure Laterality Date  . ACHILLES TENDON SURGERY  07/16/14   left ankle, bone spur removed  . ANTERIOR LUMBAR FUSION  1980   L4-5  . BACK SURGERY    . CARDIAC CATHETERIZATION  2013  . CARDIAC CATHETERIZATION N/A 09/02/2014   Procedure: Left Heart Cath and Coronary Angiography;  Surgeon: Wellington Hampshire, MD;  Location: [facility];  Service: Cardiovascular;  Laterality: N/A;  . CARDIOVERSION N/A 10/22/2015   Procedure: CARDIOVERSION;  Surgeon: Sanda Klein, MD;  Location: [facility];  Service: Cardiovascular;  Laterality: N/A;  . CARPAL TUNNEL RELEASE Right   . CATARACT EXTRACTION, BILATERAL Bilateral   . CORONARY ARTERY BYPASS GRAFT  06/26/2011   Procedure: CORONARY ARTERY BYPASS GRAFTING (CABG);  Surgeon: Melrose Nakayama, MD;  Location: [facility];  Service: Open Heart Surgery;  Laterality: N/A;  times using Greater Saphenous Vein Graft harvested endoscopically from right leg  . DIAGNOSTIC LAPAROSCOPY    . DILATION AND CURETTAGE OF UTERUS    . JOINT REPLACEMENT    . KNEE ARTHROSCOPY Bilateral   . LAPAROSCOPIC CHOLECYSTECTOMY    .  Nerve stimulator of back  12/10/15  . REVISION TOTAL KNEE ARTHROPLASTY Right   . TONSILLECTOMY    . TOTAL KNEE ARTHROPLASTY Bilateral     Social History   Social History  . Marital status: Widowed    Spouse name: N/A  . Number of children: N/A  . Years of education: N/A   Occupational History  . Retired    Social History Main Topics  . Smoking status: Passive Smoke Exposure - Never  Smoker    Packs/day: 0.00    Years: 0.00  . Smokeless tobacco: Never Used  . Alcohol use No  . Drug use: No  . Sexual activity: No   Other Topics Concern  . Not on file   Social History Narrative  . No narrative on file    Allergies  Allergen Reactions  . Zanaflex [Tizanidine] Other (See Comments)    HALLUCINATIONS  . Percocet [Oxycodone-Acetaminophen] Other (See Comments)    Hallucination  . Sulfa Antibiotics Itching    Vaginal itching  . Sulfacetamide Sodium Itching    Vaginal itching    Family History  Problem Relation Age of Onset  . Heart attack Father   . Hypertension Mother   . Stroke Mother   . Hypertension Maternal Grandfather   . Colon cancer Neg Hx     Prior to Admission medications   Medication Sig Start Date End Date Taking? Authorizing Provider  acetaminophen (TYLENOL) 500 MG tablet Take 500 mg by mouth daily as needed for headache.   Yes Historical Provider, MD  amLODipine (NORVASC) 5 MG tablet Take 1 tablet (5 mg total) by mouth daily. 09/24/15  Yes Isaiah Serge, NP  apixaban (ELIQUIS) 5 MG TABS tablet Take 1 tablet (5 mg total) by mouth 2 (two) times daily. 09/02/15  Yes Belva Crome, MD  aspirin EC 81 MG tablet Take 81 mg by mouth every Monday, Wednesday, and Friday.   Yes Historical Provider, MD  azithromycin (ZITHROMAX) 500 MG tablet Take 500 mg by mouth daily. For three days 03/20/16 03/22/16 Yes Historical Provider, MD  clonazePAM (KLONOPIN) 0.5 MG tablet Take 0.5-1 tablets (0.25-0.5 mg total) by mouth 2 (two) times daily as needed for anxiety. 09/07/14  Yes Noralee Space, MD  CYANOCOBALAMIN IJ Inject as directed every 30 (thirty) days.   Yes Historical Provider, MD  FLUoxetine (PROZAC) 20 MG capsule Take 20 mg by mouth every morning.   Yes Historical Provider, MD  furosemide (LASIX) 20 MG tablet Take 20 mg by mouth as needed for fluid or edema.   Yes Historical Provider, MD  HYDROcodone-acetaminophen (NORCO/VICODIN) 5-325 MG tablet Take 1-2 tablets  by mouth every 6 (six) hours as needed for severe pain. 02/02/16  Yes Sharlett Iles, MD  hydrocortisone (CORTEF) 20 MG tablet Take 20 mg by mouth 2 (two) times daily.   Yes Historical Provider, MD  hyoscyamine (LEVSIN SL) 0.125 MG SL tablet Place 0.125 mg under the tongue daily as needed for cramping (IBS symptoms).   Yes Historical Provider, MD  irbesartan (AVAPRO) 300 MG tablet Take 300 mg by mouth daily.  09/17/15  Yes Historical Provider, MD  levothyroxine (SYNTHROID, LEVOTHROID) 88 MCG tablet Take 88 mcg by mouth daily before breakfast.   Yes Historical Provider, MD  Multiple Vitamin (MULTIVITAMIN WITH MINERALS) TABS tablet Take 1 tablet by mouth daily.   Yes Historical Provider, MD  nebivolol (BYSTOLIC) 5 MG tablet Take 2.5 mg by mouth at bedtime. Takes 2.5 mg daily   Yes Historical Provider,  MD  nitroGLYCERIN (NITROSTAT) 0.4 MG SL tablet Place 0.4 mg under the tongue every 5 (five) minutes as needed for chest pain (3 DOSES MAX FOR CHEST PAIN).   Yes Historical Provider, MD  omega-3 acid ethyl esters (LOVAZA) 1 g capsule Take 1 g by mouth daily.   Yes Historical Provider, MD  omeprazole (PRILOSEC) 20 MG capsule Take 20 mg by mouth every morning.  11/19/13  Yes Historical Provider, MD  Probiotic Product (PROBIOTIC FORMULA PO) Take 1 capsule by mouth every morning. For upset stomach   Yes Historical Provider, MD  Vitamin D, Ergocalciferol, (DRISDOL) 50000 UNITS CAPS Take 50,000 Units by mouth 2 (two) times a week. Takes on Tuesdays and Fridays   Yes Historical Provider, MD    Physical Exam: Vitals:   03/22/16 1146 03/22/16 1230 03/22/16 1245 03/22/16 1300  BP: (!) 109/53 (!) 151/53 143/57 (!) 144/44  Pulse: 76 (!) 36 (!) 35 (!) 37  Resp: 20 18 16 17   Temp:      TempSrc:      SpO2: 91% 94% 95% 99%  Weight:      Height:         General: Appears calm and comfortable, slightly tremulous after 2 rounds of nebulizer treatment Eyes: PERRLA, EOMI, normal lids, iris ENT:  grossly normal  hearing, lips & tongue, mucous membranes moist and intact Neck: no lymphoadenopathy, masses or thyromegaly Cardiovascular: RRR, no m/r/g. No JVD, carotid bruits. No LE edema.  Respiratory: bilaterally discrete expiratory wheezes, rales, mid lung scattered rhonchi, very diminished and poorly udible BS at the basesi. Normal respiratory effort. No accessory muscle use observed Abdomen: soft, non-tender, non-distended, no organomegaly or masses appreciated. BS present in all quadrants Skin: no rash, ulcers or induration seen on limited exam Musculoskeletal: grossly normal tone BUE/BLE, good ROM, no bony abnormality or joint deformities observed Psychiatric: grossly normal mood and affect, speech fluent and appropriate, alert and oriented x3 Neurologic: CN II-XII grossly intact, moves all extremities in coordinated fashion, sensation intact  Labs on Admission: I have personally reviewed following labs and imaging studies  CBC, BMP, BNP, troponin I  GFR: Estimated Creatinine Clearance: 62.7 mL/min (by C-G formula based on SCr of 0.83 mg/dL).   Creatinine Clearance: Estimated Creatinine Clearance: 62.7 mL/min (by C-G formula based on SCr of 0.83 mg/dL).   Radiological Exams on Admission: Dg Chest 2 View  Result Date: 03/22/2016 CLINICAL DATA:  Short of breath EXAM: CHEST  2 VIEW COMPARISON:  02/02/2016 FINDINGS: Cardiac enlargement.  CABG.  Negative for heart failure. Mild patchy bibasilar atelectasis. No definite pneumonia. Negative for pleural effusion. Spinal cord stimulator unchanged in the thoracic spine IMPRESSION: Mild bibasilar atelectasis. Negative for heart failure or pneumonia. Electronically Signed   By: Franchot Gallo M.D.   On: 03/22/2016 08:55    EKG: Independently reviewed - Sinus rhythm with frequent PAC's Assessment/Plan Principal Problem:   Bronchitis Active Problems:   Hyperlipidemia with target LDL less than 70   Essential hypertension   S/P CABG x 3 2013- patent  grafts 09/03/14   Hypokalemia   Chronic back pain   CAD (coronary artery disease)   Acute bronchitis with hypoxia on admission Patient is being admitted for IC steroid and IV antibiotic therapy Will start nebs q 6 hours, expectorant, supplemental O2 NS, monitor O2 sats and wean off as tolerated Patient is on Cortef 20 mg bid at home and this will be placed on hold  CAD, s/p CABG  Patient is asymptomatic, her Troponin I  was negative - 0.01,  BNP was elevated to 450.4 Last Echo in July 2017 revealed LVH, EF 65-70% She takes Lasix at home as needed, will give one dose Lasix IV and reassess renal function and volume status tomorrow  AFib diagnosed in May 217  - now SR, continue to monitor on telemetry and restart Eliquis home doses  Hyperkalemia Replace and recheck the level next am  Hypertension - currently stable Continue home medication and adjust the doses if needed depending on the BP readings  Hyperthyroidism - most recent TSH Continue Synthroid and if needed, adjust the dose  Hyperlipidemia Continue home dose of Zocor    York Grice, [place] Pager: [phone] [facility]  If 7PM-7AM, please contact night-coverage [email or web address] Password [email or web address]  03/22/2016, 1:44 PM

## 2016-03-22 NOTE — ED Triage Notes (Signed)
Pt arrives EMS with c/o short of breath and currently on Zpak for URI. Arrives on 2 l/[place] for sat as low as 90%

## 2016-03-22 NOTE — ED Notes (Signed)
Attempted to call report

## 2016-03-22 NOTE — ED Notes (Signed)
Iv at left forearm painful with flushing.

## 2016-03-22 NOTE — Progress Notes (Signed)
MD Marily Memos paged as requested for pt arriving to unit.  [name]

## 2016-03-22 NOTE — ED Notes (Signed)
Pt coughing until gaging. C/o diaphoresis after coughing. O2 off at Dr. Peter Minium request.

## 2016-03-22 NOTE — ED Notes (Signed)
Dr. Merrell at bedside. 

## 2016-03-23 ENCOUNTER — Encounter (HOSPITAL_COMMUNITY): Payer: Self-pay

## 2016-03-23 DIAGNOSIS — Z7901 Long term (current) use of anticoagulants: Secondary | ICD-10-CM | POA: Diagnosis not present

## 2016-03-23 DIAGNOSIS — I2510 Atherosclerotic heart disease of native coronary artery without angina pectoris: Secondary | ICD-10-CM | POA: Diagnosis present

## 2016-03-23 DIAGNOSIS — Z96653 Presence of artificial knee joint, bilateral: Secondary | ICD-10-CM | POA: Diagnosis present

## 2016-03-23 DIAGNOSIS — J208 Acute bronchitis due to other specified organisms: Secondary | ICD-10-CM | POA: Diagnosis not present

## 2016-03-23 DIAGNOSIS — E039 Hypothyroidism, unspecified: Secondary | ICD-10-CM | POA: Diagnosis present

## 2016-03-23 DIAGNOSIS — J9601 Acute respiratory failure with hypoxia: Secondary | ICD-10-CM | POA: Diagnosis present

## 2016-03-23 DIAGNOSIS — Z888 Allergy status to other drugs, medicaments and biological substances status: Secondary | ICD-10-CM | POA: Diagnosis not present

## 2016-03-23 DIAGNOSIS — R0602 Shortness of breath: Secondary | ICD-10-CM | POA: Diagnosis present

## 2016-03-23 DIAGNOSIS — J40 Bronchitis, not specified as acute or chronic: Secondary | ICD-10-CM | POA: Diagnosis not present

## 2016-03-23 DIAGNOSIS — G8929 Other chronic pain: Secondary | ICD-10-CM | POA: Diagnosis present

## 2016-03-23 DIAGNOSIS — E271 Primary adrenocortical insufficiency: Secondary | ICD-10-CM

## 2016-03-23 DIAGNOSIS — Z882 Allergy status to sulfonamides status: Secondary | ICD-10-CM | POA: Diagnosis not present

## 2016-03-23 DIAGNOSIS — Z951 Presence of aortocoronary bypass graft: Secondary | ICD-10-CM | POA: Diagnosis not present

## 2016-03-23 DIAGNOSIS — Z8249 Family history of ischemic heart disease and other diseases of the circulatory system: Secondary | ICD-10-CM | POA: Diagnosis not present

## 2016-03-23 DIAGNOSIS — Z7982 Long term (current) use of aspirin: Secondary | ICD-10-CM | POA: Diagnosis not present

## 2016-03-23 DIAGNOSIS — M1990 Unspecified osteoarthritis, unspecified site: Secondary | ICD-10-CM | POA: Diagnosis present

## 2016-03-23 DIAGNOSIS — E875 Hyperkalemia: Secondary | ICD-10-CM | POA: Diagnosis not present

## 2016-03-23 DIAGNOSIS — Z79899 Other long term (current) drug therapy: Secondary | ICD-10-CM | POA: Diagnosis not present

## 2016-03-23 DIAGNOSIS — E785 Hyperlipidemia, unspecified: Secondary | ICD-10-CM | POA: Diagnosis present

## 2016-03-23 DIAGNOSIS — E876 Hypokalemia: Secondary | ICD-10-CM | POA: Diagnosis present

## 2016-03-23 DIAGNOSIS — K219 Gastro-esophageal reflux disease without esophagitis: Secondary | ICD-10-CM | POA: Diagnosis present

## 2016-03-23 DIAGNOSIS — I4891 Unspecified atrial fibrillation: Secondary | ICD-10-CM | POA: Diagnosis present

## 2016-03-23 DIAGNOSIS — I10 Essential (primary) hypertension: Secondary | ICD-10-CM | POA: Diagnosis present

## 2016-03-23 DIAGNOSIS — M545 Low back pain: Secondary | ICD-10-CM | POA: Diagnosis present

## 2016-03-23 DIAGNOSIS — Z981 Arthrodesis status: Secondary | ICD-10-CM | POA: Diagnosis not present

## 2016-03-23 DIAGNOSIS — J209 Acute bronchitis, unspecified: Secondary | ICD-10-CM | POA: Diagnosis present

## 2016-03-23 DIAGNOSIS — E0590 Thyrotoxicosis, unspecified without thyrotoxic crisis or storm: Secondary | ICD-10-CM | POA: Diagnosis present

## 2016-03-23 LAB — RESPIRATORY PANEL BY PCR
ADENOVIRUS-RVPPCR: NOT DETECTED
BORDETELLA PERTUSSIS-RVPCR: NOT DETECTED
CHLAMYDOPHILA PNEUMONIAE-RVPPCR: NOT DETECTED
CORONAVIRUS 229E-RVPPCR: NOT DETECTED
CORONAVIRUS HKU1-RVPPCR: NOT DETECTED
Coronavirus NL63: NOT DETECTED
Coronavirus OC43: NOT DETECTED
Influenza A: NOT DETECTED
Influenza B: NOT DETECTED
MYCOPLASMA PNEUMONIAE-RVPPCR: NOT DETECTED
Metapneumovirus: NOT DETECTED
Parainfluenza Virus 1: NOT DETECTED
Parainfluenza Virus 2: NOT DETECTED
Parainfluenza Virus 3: NOT DETECTED
Parainfluenza Virus 4: NOT DETECTED
RHINOVIRUS / ENTEROVIRUS - RVPPCR: NOT DETECTED
Respiratory Syncytial Virus: DETECTED — AB

## 2016-03-23 LAB — CBC
HCT: 40.9 % (ref 36.0–46.0)
Hemoglobin: 13.2 g/dL (ref 12.0–15.0)
MCH: 29.5 pg (ref 26.0–34.0)
MCHC: 32.3 g/dL (ref 30.0–36.0)
MCV: 91.5 fL (ref 78.0–100.0)
PLATELETS: 213 10*3/uL (ref 150–400)
RBC: 4.47 MIL/uL (ref 3.87–5.11)
RDW: 12.9 % (ref 11.5–15.5)
WBC: 13.2 10*3/uL — ABNORMAL HIGH (ref 4.0–10.5)

## 2016-03-23 LAB — BASIC METABOLIC PANEL
Anion gap: 8 (ref 5–15)
BUN: 12 mg/dL (ref 6–20)
CALCIUM: 9.1 mg/dL (ref 8.9–10.3)
CHLORIDE: 101 mmol/L (ref 101–111)
CO2: 32 mmol/L (ref 22–32)
CREATININE: 0.83 mg/dL (ref 0.44–1.00)
GFR calc Af Amer: >60 mL/min (ref >60)
GFR calc non Af Amer: >60 mL/min (ref >60)
Glucose, Bld: 191 mg/dL — ABNORMAL HIGH (ref 65–99)
Potassium: 4.6 mmol/L (ref 3.5–5.1)
Sodium: 141 mmol/L (ref 135–145)

## 2016-03-23 MED ORDER — HYDROCORTISONE NA SUCCINATE PF 100 MG IJ SOLR
50.0000 mg | Freq: Four times a day (QID) | INTRAMUSCULAR | Status: DC
  Administered 2016-03-23 – 2016-03-25 (×8): 50 mg via INTRAVENOUS
  Filled 2016-03-23 (×8): qty 2

## 2016-03-23 NOTE — Discharge Instructions (Signed)

## 2016-03-23 NOTE — Care Management Note (Signed)
Case Management Note  Patient Details  Name: [name] MRN: [id number] Date of Birth: [birth year]  Subjective/Objective:         Admitted with Bronchitis           Action/Plan: Failed outpatient treatment; PCP: Jerlyn Ly, MD; has private insurance with [facility]; CM following for DCP/ possibly need [place] services at discharge;  Expected Discharge Date:     Possible 03/26/2016             Expected Discharge Plan:  Home/Self Care  Discharge planning Services  CM Consult  Status of Service:  In process, will continue to follow  Sherrilyn Rist [id number] 03/23/2016, 10:00 AM

## 2016-03-23 NOTE — Evaluation (Signed)
Physical Therapy Evaluation Patient Details Name: [name] MRN: [id number] DOB: [birth year] Today's Date: 03/23/2016   History of Present Illness  75 y.o. female with medical history significant of CAD, s/p CABG, known AFib on anticoagulation therapy with Eliquis,Addison's disease, hypothyroidism, hypertension, GERD who reports multiple episodes of bronchitis did not respond to antibiotics over the last couple months, presents with cough, shortness of breath, congestion, as well she reports fatigue, lightheadedness, febrile 100.8, admitted for bronchitis, and symptomatic Addison's disease.  Clinical Impression  Pt is independent with mobility, she ambulated 180' without an assistive device and had no LOB. SaO2 walking was 90-92% on RA, HR 80s, 2/4 dyspnea. No further PT indicated. Encouraged pt to ambulate at least BID to prevent deconditioning while in the hospital. PT signing off.     Follow Up Recommendations No PT follow up    Equipment Recommendations  None recommended by PT    Recommendations for Other Services       Precautions / Restrictions Precautions Precautions: None Restrictions Weight Bearing Restrictions: No      Mobility  Bed Mobility Overal bed mobility: Independent                Transfers Overall transfer level: Independent                  Ambulation/Gait Ambulation/Gait assistance: Independent Ambulation Distance (Feet): 180 Feet Assistive device: None Gait Pattern/deviations: WFL(Within Functional Limits)   Gait velocity interpretation: at or above normal speed for age/gender General Gait Details: steady, no LOB, Sao2 90-92% on RA with walking, HR 87, 2/4 dyspnea  Stairs            Wheelchair Mobility    Modified Rankin (Stroke Patients Only)       Balance Overall balance assessment: Independent                                           Pertinent Vitals/Pain Pain Assessment: No/denies pain     Home Living Family/patient expects to be discharged to:: Private residence Living Arrangements: Alone Available Help at Discharge: Friend(s);Family;Available PRN/intermittently Type of Home: House Home Access: Stairs to enter Entrance Stairs-Rails: None Entrance Stairs-Number of Steps: 2 Home Layout: Two level;Bed/bath upstairs Home Equipment: Walker - 2 wheels;Bedside commode;Shower seat;Tub bench;Grab bars - tub/shower      Prior Function Level of Independence: Independent with assistive device(s)         Comments: uses RW prn (~ 1x/month when cortisol levels are off), otherwise walks without AD; reports 1 fall in past year, fell forward out of a chair     Hand Dominance        Extremity/Trunk Assessment   Upper Extremity Assessment Upper Extremity Assessment: Overall WFL for tasks assessed    Lower Extremity Assessment Lower Extremity Assessment: Overall WFL for tasks assessed    Cervical / Trunk Assessment Cervical / Trunk Assessment: Normal  Communication   Communication: No difficulties  Cognition Arousal/Alertness: Awake/alert Behavior During Therapy: WFL for tasks assessed/performed Overall Cognitive Status: Within Functional Limits for tasks assessed                      General Comments      Exercises     Assessment/Plan    PT Assessment Patent does not need any further PT services  PT Problem List  PT Treatment Interventions      PT Goals (Current goals can be found in the Care Plan section)  Acute Rehab PT Goals Patient Stated Goal: to get rid of the cough, return home PT Goal Formulation: All assessment and education complete, DC therapy    Frequency     Barriers to discharge        Co-evaluation               End of Session Equipment Utilized During Treatment: Gait belt Activity Tolerance: Patient tolerated treatment well Patient left: in chair;with call bell/phone within reach Nurse Communication:  Mobility status    Functional Assessment Tool Used: clinical judgement Functional Limitation: Mobility: Walking and moving around Mobility: Walking and Moving Around Current Status [phone]): 0 percent impaired, limited or restricted Mobility: Walking and Moving Around Goal Status [phone]): 0 percent impaired, limited or restricted Mobility: Walking and Moving Around Discharge Status [phone]): 0 percent impaired, limited or restricted    Time: 1359-1420 PT Time Calculation (min) (ACUTE ONLY): 21 min   Charges:   PT Evaluation $PT Eval Low Complexity: 1 Procedure     PT G Codes:   PT G-Codes **NOT FOR INPATIENT CLASS** Functional Assessment Tool Used: clinical judgement Functional Limitation: Mobility: Walking and moving around Mobility: Walking and Moving Around Current Status [phone]): 0 percent impaired, limited or restricted Mobility: Walking and Moving Around Goal Status [phone]): 0 percent impaired, limited or restricted Mobility: Walking and Moving Around Discharge Status [id number]): 0 percent impaired, limited or restricted    Philomena Doheny 03/23/2016, 2:25 PM [phone]

## 2016-03-23 NOTE — Progress Notes (Addendum)
PROGRESS NOTE                                                                                                                                                                                                             Patient Demographics:    [name], is a 75 y.o. female, DOB - [birth year], [id number]  Admit date - 03/22/2016   Admitting Physician Waldemar Dickens, MD  Outpatient Primary MD for the patient is Abigail Ly, MD  LOS - 0    Chief Complaint  Patient presents with  . Shortness of Breath       Brief Narrative   75 y.o. female with medical history significant of CAD, s/p CABG, known AFib on anticoagulation therapy with Eliquis,Addison's disease, hypothyroidism, hypertension, GERD who reports multiple episodes of bronchitis did not respond to antibiotics over the last couple months, presents with cough, shortness of breath, congestion, as well she reports fatigue, lightheadedness, febrile 100.8, admitted for bronchitis, and symptomatic Addison's disease.   Subjective:    [name] today has, No headache, No chest pain, No abdominal pain - Poor generalized weakness, fatigue, lightheadedness, cough with productive sputum .   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 - Check respiratory panel - Treated with doxycycline and nebs - On IV steroids given wheezing on admission.  Addison's disease - She is on hydrocortisone 20 twice a day at home, reports dizziness, weakness and fatigue, will start on stress  dose steroids.  CAD, s/p CABG  - Patient is asymptomatic, her Troponin I was negative - 0.01,  BNP was elevated to 450.4 - Last Echo in July 2017 revealed LVH, EF 65-70% - She takes Lasix at home as needed, will continue to hold  AFib diagnosed in May 217   - now SR, continue to monitor on telemetry and restart Eliquis home doses  Hyperkalemia - Repleted  Hypertension  - currently stable - Continue home medication  Hyperthyroidism  - Continue Synthroid   Hyperlipidemia - Continue home dose of Zocor  Code Status : Full  Family Communication  : none at bedside  Disposition Plan  : Home in 1-2 days when off stress dose steroids  Consults  :  None  Procedures  : None  DVT Prophylaxis  :  apixaban  Lab Results  Component Value Date   PLT 213 03/23/2016    Antibiotics  :    Anti-infectives    Start     Dose/Rate Route Frequency Ordered Stop   03/22/16 1345  doxycycline (VIBRAMYCIN) 100 mg in dextrose 5 % 250 mL IVPB     100 mg 125 mL/hr over 120 Minutes Intravenous Every 12 hours 03/22/16 1338 03/29/16 1344   03/22/16 0930  doxycycline (VIBRA-TABS) tablet 100 mg     100 mg Oral  Once 03/22/16 0925 03/22/16 0949        Objective:   Vitals:   03/22/16 1545 03/22/16 1608 03/22/16 2041 03/23/16 1121  BP: (!) 133/45 (!) 133/45 (!) 113/44 140/74  Pulse: (!) 37  70 82  Resp: 21  20 18   Temp:   97.6 F (36.4 C) 98.2 F (36.8 C)  TempSrc:   Oral Oral  SpO2: 99%  97% 97%  Weight:      Height:        Wt Readings from Last 3 Encounters:  03/22/16 90.3 kg (199 lb)  03/16/16 93.1 kg (205 lb 4 oz)  03/06/16 92 kg (202 lb 12.8 oz)     Intake/Output Summary (Last 24 hours) at 03/23/16 1135 Last data filed at 03/23/16 1034  Gross per 24 hour  Intake             1340 ml  Output             1800 ml  Net             -460 ml     Physical Exam  Awake Alert, Oriented X 3 Supple Neck,No JVD,  Symmetrical Chest wall movement, Good air movement bilaterally,  CTAB RRR,No Gallops,Rubs or new Murmurs, No Parasternal Heave +ve B.Sounds, Abd Soft, No tenderness, No organomegaly appriciated, No rebound - guarding or rigidity. No Cyanosis, Clubbing or edema, No new Rash or bruise     Data Review:    CBC  Recent Labs Lab 03/22/16 0800 03/23/16 0435  WBC 8.5 13.2*  HGB 13.3 13.2  HCT 39.7 40.9  PLT 204 213  MCV 91.1 91.5  MCH 30.5 29.5  MCHC 33.5 32.3  RDW 13.0 12.9    Chemistries   Recent Labs Lab 03/22/16 0800 03/22/16 1423 03/23/16 0435  NA 138 136 141  K 3.4* 3.2* 4.6  CL 101 100* 101  CO2 26 24 32  GLUCOSE 123* 202* 191*  BUN 9 10 12   CREATININE 0.83 1.01* 0.83  CALCIUM 8.4* 8.3* 9.1  AST  --  32  --   ALT  --  21  --   ALKPHOS  --  64  --   BILITOT  --  0.9  --    ------------------------------------------------------------------------------------------------------------------ No results for input(s): CHOL, HDL, LDLCALC, TRIG, CHOLHDL, LDLDIRECT in the last 72 hours.  No results found for: HGBA1C ------------------------------------------------------------------------------------------------------------------ No results for input(s): TSH, T4TOTAL, T3FREE, THYROIDAB in the last 72 hours.  Invalid input(s): FREET3 ------------------------------------------------------------------------------------------------------------------ No results for input(s): VITAMINB12, FOLATE, FERRITIN, TIBC, IRON, RETICCTPCT in the last 72  hours.  Coagulation profile No results for input(s): INR, PROTIME in the last 168 hours.  No results for input(s): DDIMER in the last 72 hours.  Cardiac Enzymes No results for input(s): CKMB, TROPONINI, MYOGLOBIN in the last 168 hours.  Invalid input(s): CK ------------------------------------------------------------------------------------------------------------------    Component Value Date/Time   BNP 450.4 (H) 03/22/2016 0800    Inpatient Medications  Scheduled Meds: . amLODipine  5  mg Oral Daily  . apixaban  5 mg Oral BID  . aspirin EC  81 mg Oral Q M,W,F  . doxycycline (VIBRAMYCIN) IV  100 mg Intravenous Q12H  . FLUoxetine  20 mg Oral q morning - 10a  . guaiFENesin  1,200 mg Oral BID  . irbesartan  300 mg Oral Daily  . levothyroxine  88 mcg Oral QAC breakfast  . methylPREDNISolone (SOLU-MEDROL) injection  60 mg Intravenous Q6H  . nebivolol  2.5 mg Oral QHS  . omega-3 acid ethyl esters  1 g Oral Daily  . pantoprazole  40 mg Oral Daily  . saccharomyces boulardii  250 mg Oral Daily  . sodium chloride flush  3 mL Intravenous Q12H  . Vitamin D (Ergocalciferol)  50,000 Units Oral Once per day on Mon Thu   Continuous Infusions: PRN Meds:.acetaminophen, clonazePAM, HYDROcodone-acetaminophen, hyoscyamine, ipratropium-albuterol, nitroGLYCERIN, ondansetron **OR** ondansetron (ZOFRAN) IV, polyethylene glycol, zolpidem  Micro Results No results found for this or any previous visit (from the past 240 hour(s)).  Radiology Reports Dg Chest 2 View  Result Date: 03/22/2016 CLINICAL DATA:  Short of breath EXAM: CHEST  2 VIEW COMPARISON:  02/02/2016 FINDINGS: Cardiac enlargement.  CABG.  Negative for heart failure. Mild patchy bibasilar atelectasis. No definite pneumonia. Negative for pleural effusion. Spinal cord stimulator unchanged in the thoracic spine IMPRESSION: Mild bibasilar atelectasis. Negative for heart failure or pneumonia. Electronically Signed   By: Franchot Gallo M.D.   On: 03/22/2016 08:55      Jarielys Girardot M.D on 03/23/2016 at 11:35 AM  Between 7am to 7pm - Pager - [phone]  After 7pm go to [email or web address] - password [facility]  [facility] -  Office  [phone]

## 2016-03-23 NOTE — Progress Notes (Signed)
MD aware pt positive for RSV  [name]

## 2016-03-24 DIAGNOSIS — J208 Acute bronchitis due to other specified organisms: Secondary | ICD-10-CM

## 2016-03-24 NOTE — Progress Notes (Signed)
PROGRESS NOTE                                                                                                                                                                                                             Patient Demographics:    [name], is a 75 y.o. female, DOB - 26-Apr-1940, [id number]  Admit date - 03/22/2016   Admitting Physician Waldemar Dickens, MD  Outpatient Primary MD for the patient is Jerlyn Ly, MD  LOS - 1    Chief Complaint  Patient presents with  . Shortness of Breath       Brief Narrative   75 y.o. female with medical history significant of CAD, s/p CABG, known AFib on anticoagulation therapy with Eliquis,Addison's disease, hypothyroidism, hypertension, GERD who reports multiple episodes of bronchitis did not respond to antibiotics over the last couple months, presents with cough, shortness of breath, congestion, as well she reports fatigue, lightheadedness, febrile 100.8, admitted for bronchitis, and symptomatic Addison's disease.   Subjective:    [name] today has, No headache, No chest pain, No abdominal pain - complaint  generalized weakness, fatigue, lightheadedness, cough with productive sputum .   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 - Treated with doxycycline and nebs - On IV steroids given wheezing on admission. - Wean oxygen as tolerated  Addison's disease - She is on hydrocortisone 20 twice a day at home, reports dizziness, weakness and  fatigue, continue stress dose steroids giving patient is symptomatic .  CAD, s/p CABG  - Patient is asymptomatic, her Troponin  was negative - 0.01,  BNP was elevated to 450.4 - Last Echo in July 2017 revealed LVH, EF 65-70% - She takes Lasix at home as needed, will continue to hold  AFib diagnosed in May 217   - now SR, continue to monitor on telemetry and  restart Eliquis home doses  Hyperkalemia - Repleted  Hypertension  - currently stable - Continue home medication  Hyperthyroidism  - Continue Synthroid   Hyperlipidemia - Continue home dose of Zocor  Code Status : Full  Family Communication  : none at bedside  Disposition Plan  : Home in 1-2 days when off stress dose steroids  Consults  :  None  Procedures  : None  DVT Prophylaxis  :  apixaban  Lab Results  Component Value Date   PLT 213 03/23/2016    Antibiotics  :    Anti-infectives    Start     Dose/Rate Route Frequency Ordered Stop   03/22/16 1345  doxycycline (VIBRAMYCIN) 100 mg in dextrose 5 % 250 mL IVPB     100 mg 125 mL/hr over 120 Minutes Intravenous Every 12 hours 03/22/16 1338 03/29/16 1344   03/22/16 0930  doxycycline (VIBRA-TABS) tablet 100 mg     100 mg Oral  Once 03/22/16 0925 03/22/16 0949        Objective:   Vitals:   03/23/16 2011 03/24/16 0408 03/24/16 1000 03/24/16 1204  BP: (!) 159/93 132/69 106/79 (!) 108/55  Pulse: 91 74 75 67  Resp: 18 18  18   Temp: 98.6 F (37 C) 97.7 F (36.5 C) 97.5 F (36.4 C) 97.7 F (36.5 C)  TempSrc: Oral Oral Oral Oral  SpO2: 94% 96% 95% 94%  Weight:  90.9 kg (200 lb 6.4 oz)    Height:        Wt Readings from Last 3 Encounters:  03/24/16 90.9 kg (200 lb 6.4 oz)  03/16/16 93.1 kg (205 lb 4 oz)  03/06/16 92 kg (202 lb 12.8 oz)     Intake/Output Summary (Last 24 hours) at 03/24/16 1226 Last data filed at 03/24/16 0600  Gross per 24 hour  Intake              580 ml  Output             1351 ml  Net             -771 ml     Physical  Exam  Awake Alert, Oriented X 3 Supple Neck,No JVD,  Symmetrical Chest wall movement, Good air movement bilaterally, CTAB RRR,No Gallops,Rubs or new Murmurs, No Parasternal Heave +ve B.Sounds, Abd Soft, No tenderness, No rebound - guarding or rigidity. No Cyanosis, Clubbing or edema, No new Rash or bruise     Data Review:    CBC  Recent Labs Lab 03/22/16 0800 03/23/16 0435  WBC 8.5 13.2*  HGB 13.3 13.2  HCT 39.7 40.9  PLT 204 213  MCV 91.1 91.5  MCH 30.5 29.5  MCHC 33.5 32.3  RDW 13.0 12.9    Chemistries   Recent Labs Lab 03/22/16 0800 03/22/16 1423 03/23/16 0435  NA 138 136 141  K 3.4* 3.2* 4.6  CL 101 100* 101  CO2 26 24 32  GLUCOSE 123* 202* 191*  BUN 9 10 12   CREATININE 0.83 1.01* 0.83  CALCIUM 8.4* 8.3* 9.1  AST  --  32  --   ALT  --  21  --   ALKPHOS  --  64  --   BILITOT  --  0.9  --    ------------------------------------------------------------------------------------------------------------------ No results for input(s): CHOL, HDL, LDLCALC, TRIG, CHOLHDL, LDLDIRECT in the last 72 hours.  No results found for: HGBA1C ------------------------------------------------------------------------------------------------------------------ No results for input(s): TSH, T4TOTAL, T3FREE, THYROIDAB in the last 72 hours.  Invalid input(s): FREET3 ------------------------------------------------------------------------------------------------------------------ No results for input(s): VITAMINB12, FOLATE, FERRITIN, TIBC, IRON, RETICCTPCT in the last 72 hours.  Coagulation profile No results for input(s): INR, PROTIME in the last 168 hours.  No results for input(s): DDIMER in the last 72 hours.  Cardiac Enzymes No results for input(s): CKMB, TROPONINI, MYOGLOBIN in the last 168 hours.  Invalid input(s): CK ------------------------------------------------------------------------------------------------------------------    Component Value Date/Time   BNP  450.4 (H) 03/22/2016 0800    Inpatient Medications  Scheduled Meds: . amLODipine  5 mg Oral Daily  . apixaban  5 mg Oral BID  . aspirin EC  81 mg Oral Q M,W,F  . doxycycline (VIBRAMYCIN) IV  100 mg Intravenous Q12H  . FLUoxetine  20 mg Oral q morning - 10a  . guaiFENesin  1,200 mg Oral BID  . hydrocortisone sod succinate (SOLU-CORTEF) inj  50 mg Intravenous Q6H  . irbesartan  300 mg Oral Daily  . levothyroxine  88 mcg Oral QAC breakfast  . nebivolol  2.5 mg Oral QHS  . omega-3 acid ethyl esters  1 g Oral Daily  . pantoprazole  40 mg Oral Daily  . saccharomyces boulardii  250 mg Oral Daily  . sodium chloride flush  3 mL Intravenous Q12H  . Vitamin D (Ergocalciferol)  50,000 Units Oral Once per day on Mon Thu   Continuous Infusions: PRN Meds:.acetaminophen, clonazePAM, HYDROcodone-acetaminophen, hyoscyamine, ipratropium-albuterol, nitroGLYCERIN, ondansetron **OR** ondansetron (ZOFRAN) IV, polyethylene glycol, zolpidem  Micro Results Recent Results (from the past 240 hour(s))  Respiratory Panel by PCR     Status: Abnormal   Collection Time: 03/23/16  7:25 AM  Result Value Ref Range Status   Adenovirus NOT DETECTED NOT DETECTED Final   Coronavirus 229E NOT DETECTED NOT DETECTED Final   Coronavirus HKU1 NOT DETECTED NOT DETECTED Final   Coronavirus NL63 NOT DETECTED NOT DETECTED Final   Coronavirus OC43 NOT DETECTED NOT DETECTED Final   Metapneumovirus NOT DETECTED NOT DETECTED Final   Rhinovirus / Enterovirus NOT DETECTED NOT DETECTED Final   Influenza A NOT DETECTED NOT DETECTED Final   Influenza B NOT DETECTED NOT DETECTED Final   Parainfluenza Virus 1 NOT DETECTED NOT DETECTED Final   Parainfluenza Virus 2 NOT DETECTED NOT DETECTED Final   Parainfluenza Virus 3 NOT DETECTED NOT DETECTED Final   Parainfluenza Virus 4 NOT DETECTED NOT DETECTED Final   Respiratory Syncytial Virus DETECTED (A) NOT DETECTED Final    Comment: CRITICAL RESULT CALLED TO, READ BACK BY AND VERIFIED  WITH: Shade Flood RN 14:30 03/23/16 ([email or web address])    Bordetella pertussis NOT DETECTED NOT DETECTED Final   Chlamydophila pneumoniae NOT DETECTED NOT DETECTED Final   Mycoplasma pneumoniae NOT DETECTED NOT DETECTED Final    Radiology Reports Dg Chest 2 View  Result Date: 03/22/2016 CLINICAL DATA:  Short of breath EXAM: CHEST  2 VIEW COMPARISON:  02/02/2016 FINDINGS: Cardiac enlargement.  CABG.  Negative for heart failure. Mild patchy bibasilar atelectasis. No definite pneumonia. Negative for pleural effusion. Spinal cord stimulator unchanged in the thoracic spine IMPRESSION: Mild bibasilar atelectasis. Negative for heart failure or pneumonia. Electronically Signed   By: Franchot Gallo M.D.   On: 03/22/2016 08:55      Aariona Momon M.D on 03/24/2016 at 12:26 PM  Between 7am to 7pm - Pager - [phone]  After 7pm go to [email or web address] - password [facility]  [facility] -  Office  [phone]

## 2016-03-25 LAB — BASIC METABOLIC PANEL
Anion gap: 6 (ref 5–15)
BUN: 17 mg/dL (ref 6–20)
CALCIUM: 8.6 mg/dL — AB (ref 8.9–10.3)
CO2: 32 mmol/L (ref 22–32)
Chloride: 102 mmol/L (ref 101–111)
Creatinine, Ser: 0.76 mg/dL (ref 0.44–1.00)
GFR calc Af Amer: >60 mL/min (ref >60)
GFR calc non Af Amer: >60 mL/min (ref >60)
GLUCOSE: 156 mg/dL — AB (ref 65–99)
Potassium: 4.1 mmol/L (ref 3.5–5.1)
Sodium: 140 mmol/L (ref 135–145)

## 2016-03-25 MED ORDER — LEVOTHYROXINE SODIUM 88 MCG PO TABS: 88.0000 ug | ORAL_TABLET | Freq: Every day | ORAL | 0 refills | Status: AC

## 2016-03-25 MED ORDER — HYDROCORTISONE 20 MG PO TABS
20.0000 mg | ORAL_TABLET | Freq: Two times a day (BID) | ORAL | Status: DC
  Administered 2016-03-25: 20 mg via ORAL
  Filled 2016-03-25 (×2): qty 1

## 2016-03-25 MED ORDER — ALBUTEROL SULFATE (2.5 MG/3ML) 0.083% IN NEBU: 2.5000 mg | INHALATION_SOLUTION | Freq: Four times a day (QID) | RESPIRATORY_TRACT | 1 refills | Status: DC | PRN

## 2016-03-25 MED ORDER — GUAIFENESIN ER 600 MG PO TB12: 1200.0000 mg | ORAL_TABLET | Freq: Two times a day (BID) | ORAL | 0 refills | Status: DC

## 2016-03-25 MED ORDER — ALBUTEROL SULFATE HFA 108 (90 BASE) MCG/ACT IN AERS: 2.0000 | INHALATION_SPRAY | Freq: Four times a day (QID) | RESPIRATORY_TRACT | 2 refills | Status: DC | PRN

## 2016-03-25 NOTE — Discharge Summary (Addendum)
[name], is a 75 y.o. female  DOB November 25, 1940  MRN [id number].  Admission date:  03/22/2016  Admitting Physician  Waldemar Dickens, MD  Discharge Date:  03/25/2016   Primary MD  Jerlyn Ly, MD  Recommendations for primary care physician for things to follow:  - please check CBC , BMP during next visit   Admission Diagnosis  Bronchitis [J40] Hypoxia [R09.02] Nausea and vomiting, intractability of vomiting not specified, unspecified vomiting type [R11.2]   Discharge Diagnosis  Bronchitis [J40] Hypoxia [R09.02] Nausea and vomiting, intractability of vomiting not specified, unspecified vomiting type [R11.2]    Principal Problem:   Bronchitis Active Problems:   Hyperlipidemia with target LDL less than 70   Essential hypertension   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 Addison's disease ([place])   . Angina   . Anxiety   . Arthritis    "everywhere"  . Atrial fibrillation ([place])   . Chronic lower back pain   . 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  last knee surgery dr. Elmyra Ricks gave large dose of hydrocortisal and avoided the hypotensive side effectas of addison's disease.  . Coronary artery disease   . Depression   . Dysrhythmia   . GERD (gastroesophageal reflux disease)   . Hyperlipemia   . Hypertension   . Hypothyroidism   . Lumbar radiculopathy 04/27/2014  . PONV (postoperative nausea and vomiting)   . Recurrent upper respiratory infection (URI)   . Shortness of breath     Past Surgical History:  Procedure Laterality Date  . ACHILLES TENDON SURGERY  07/16/14   left ankle, bone spur removed  . ANTERIOR LUMBAR FUSION  1980   L4-5  . BACK SURGERY    . CARDIAC CATHETERIZATION  2013  . CARDIAC CATHETERIZATION N/A 09/02/2014   Procedure: Left Heart Cath and Coronary Angiography;  Surgeon: Wellington Hampshire, MD;  Location: [facility];  Service: Cardiovascular;  Laterality: N/A;  . CARDIOVERSION N/A 10/22/2015   Procedure: CARDIOVERSION;  Surgeon: Sanda Klein, MD;  Location: [facility];  Service: Cardiovascular;  Laterality: N/A;  . CARPAL TUNNEL RELEASE Right   . CATARACT EXTRACTION, BILATERAL Bilateral   . CORONARY ARTERY BYPASS GRAFT  06/26/2011   Procedure: CORONARY ARTERY BYPASS GRAFTING (CABG);  Surgeon: Melrose Nakayama, MD;  Location: [facility];  Service: Open Heart Surgery;  Laterality: N/A;  times using Greater Saphenous Vein Graft harvested endoscopically from right leg  . DIAGNOSTIC LAPAROSCOPY    . DILATION AND CURETTAGE OF UTERUS    . JOINT REPLACEMENT    . KNEE ARTHROSCOPY Bilateral   . LAPAROSCOPIC CHOLECYSTECTOMY    . Nerve stimulator of back  12/10/15  . REVISION TOTAL KNEE ARTHROPLASTY Right   .  TONSILLECTOMY    . TOTAL KNEE ARTHROPLASTY Bilateral        History of present illness and  Hospital Course:     Kindly see H&P for history of present illness and admission details, please review complete Labs, Consult reports and Test reports for all details in brief  HPI  from the history and physical done on the day of admission 03/22/2016  HPI: [name] is a 75 y.o. female with medical history significant of CAD, s/p CABG, known AFib on anticoagulation therapy with Eliquis, hypothyroidism, hypertension, GERD who developed bronchitis last Friday. Patient is prone to recurrent upper respiratory infections that occur every winter and she contacted her PCP who prescribed a three-day pack of azithromycin. She completed of the course of antimicrobial therapy but his symptoms did not improve. Last night patient was unable to sleep due to increased chest congestion and coughing bringing up  yellowish phlegm and eventually her shortness of breath became so severe that she had to call EMS and was delivered to the emergency department. She denied chest pain, palpitations, but complained of nausea that occurred this morning in the emergency department and prior to calling EMS she was having shaking chills at home  ED Course: In the ED patient had fever of 100.44F and was given Tylenol after which her temperature normalized. The rest of the vital signs were unremarkable. Her blood work showed normal troponin, but mildly elevated BNP to 450. Chest x-ray didn't show any signs of CHF but revealed cardiomegaly and otherwise no acute changes BMP showed a mild hypokalemia with potassium of 3.4 CBC showed normal white blood cells count normal platelets and hemoglobin   Hospital Course   75 y.o.femalewith medical history significant of CAD, s/p CABG, known AFib on anticoagulation therapy with Eliquis,Addison's disease, hypothyroidism, hypertension, GERDwho reports multiple episodes of bronchitis did not respond to antibiotics over the last couple months, presents with cough, shortness of breath, congestion, as well she reports fatigue, lightheadedness, febrile 100.8, admitted for bronchitis, and symptomatic Addison's disease.  Acute Viral bronchitis with hypoxia - Chest x-ray with no evidence of pneumonia, she is febrile 100.8, respiratory panel growing RSV. - Treated with doxycycline and nebs, no evidence of bacterial infection, so doxycycline has been stopped on discharge - On IV steroids given wheezing on admission, resumed on her home dose Cortef on discharge - Further hypoxia, tolerating room air  Addison's disease - She is on hydrocortisone 20 twice a day at home, reports dizziness, weakness and fatigue on admission, so she was kept on stress dose steroids during hospital stay given she was symptomatic, today she reports significant improvement of her symptoms, so IV stressors has been  stopped, started on her home dose Cortef   CAD, s/p CABG  - Patient is asymptomatic, her Troponin  was negative - 0.01, BNP was elevated to 450.4 - Last Echo in July 2017 revealed LVH, EF 65-70% - She takes Lasix at home as needed  AFib diagnosed in May 217  -now SR, continue to monitor on telemetry and restart Eliquis home doses  Hypokalemia - Repleted  Hypertension  - Continue home medication  Hyperthyroidism  - Continue Synthroid   Hyperlipidemia - Continue home dose of Zocor    Discharge Condition:  stable   Follow UP  Follow-up Information    PERINI,MARK A, MD Follow up in 1 week(s).   Specialty:  Internal Medicine Contact information: [address] [place] [address] [phone]  Discharge Instructions  and  Discharge Medications     Discharge Instructions    Discharge instructions    Complete by:  As directed    Follow with Primary MD Crist Infante A, MD in 7 days   Get CBC, CMP, 2 view Chest X ray checked  by Primary MD next visit.    Activity: As tolerated with Full fall precautions use walker/cane & assistance as needed   Disposition Home    Diet: Heart Healthy  , with feeding assistance and aspiration precautions.  For Heart failure patients - Check your Weight same time everyday, if you gain over 2 pounds, or you develop in leg swelling, experience more shortness of breath or chest pain, call your Primary MD immediately. Follow Cardiac Low Salt Diet and 1.5 lit/day fluid restriction.   On your next visit with your primary care physician please Get Medicines reviewed and adjusted.   Please request your Prim.MD to go over all Hospital Tests and Procedure/Radiological results at the follow up, please get all Hospital records sent to your Prim MD by signing hospital release before you go home.   If you experience worsening of your admission symptoms, develop shortness of breath, life threatening emergency,  suicidal or homicidal thoughts you must seek medical attention immediately by calling 911 or calling your MD immediately  if symptoms less severe.  You Must read complete instructions/literature along with all the possible adverse reactions/side effects for all the Medicines you take and that have been prescribed to you. Take any new Medicines after you have completely understood and accpet all the possible adverse reactions/side effects.   Do not drive, operating heavy machinery, perform activities at heights, swimming or participation in water activities or provide baby sitting services if your were admitted for syncope or siezures until you have seen by Primary MD or a Neurologist and advised to do so again.  Do not drive when taking Pain medications.    Do not take more than prescribed Pain, Sleep and Anxiety Medications  Special Instructions: If you have smoked or chewed Tobacco  in the last 2 yrs please stop smoking, stop any regular Alcohol  and or any Recreational drug use.  Wear Seat belts while driving.   Please note  You were cared for by a hospitalist during your hospital stay. If you have any questions about your discharge medications or the care you received while you were in the hospital after you are discharged, you can call the unit and asked to speak with the hospitalist on call if the hospitalist that took care of you is not available. Once you are discharged, your primary care physician will handle any further medical issues. Please note that NO REFILLS for any discharge medications will be authorized once you are discharged, as it is imperative that you return to your primary care physician (or establish a relationship with a primary care physician if you do not have one) for your aftercare needs so that they can reassess your need for medications and monitor your lab values.   Increase activity slowly    Complete by:  As directed      Allergies as of 03/25/2016       Reactions   Zanaflex [tizanidine] Other (See Comments)   HALLUCINATIONS   Percocet [oxycodone-acetaminophen] Other (See Comments)   Hallucination   Sulfa Antibiotics Itching   Vaginal itching   Sulfacetamide Sodium Itching   Vaginal itching      Medication List    STOP taking  these medications   azithromycin 500 MG tablet Commonly known as:  ZITHROMAX     TAKE these medications   acetaminophen 500 MG tablet Commonly known as:  TYLENOL Take 500 mg by mouth daily as needed for headache.   albuterol 108 (90 Base) MCG/ACT inhaler Commonly known as:  PROVENTIL HFA;VENTOLIN HFA Inhale 2 puffs into the lungs every 6 (six) hours as needed for wheezing or shortness of breath.   amLODipine 5 MG tablet Commonly known as:  NORVASC Take 1 tablet (5 mg total) by mouth daily.   apixaban 5 MG Tabs tablet Commonly known as:  ELIQUIS Take 1 tablet (5 mg total) by mouth 2 (two) times daily.   aspirin EC 81 MG tablet Take 81 mg by mouth every Monday, Wednesday, and Friday.   clonazePAM 0.5 MG tablet Commonly known as:  KLONOPIN Take 0.5-1 tablets (0.25-0.5 mg total) by mouth 2 (two) times daily as needed for anxiety.   CYANOCOBALAMIN IJ Inject as directed every 30 (thirty) days.   FLUoxetine 20 MG capsule Commonly known as:  PROZAC Take 20 mg by mouth every morning.   furosemide 20 MG tablet Commonly known as:  LASIX Take 20 mg by mouth as needed for fluid or edema.   guaiFENesin 600 MG 12 hr tablet Commonly known as:  MUCINEX Take 2 tablets (1,200 mg total) by mouth 2 (two) times daily.   HYDROcodone-acetaminophen 5-325 MG tablet Commonly known as:  NORCO/VICODIN Take 1-2 tablets by mouth every 6 (six) hours as needed for severe pain.   hydrocortisone 20 MG tablet Commonly known as:  CORTEF Take 20 mg by mouth 2 (two) times daily.   hyoscyamine 0.125 MG SL tablet Commonly known as:  LEVSIN SL Place 0.125 mg under the tongue daily as needed for cramping (IBS  symptoms).   irbesartan 300 MG tablet Commonly known as:  AVAPRO Take 300 mg by mouth daily.   levothyroxine 88 MCG tablet Commonly known as:  SYNTHROID, LEVOTHROID Take 1 tablet (88 mcg total) by mouth daily before breakfast.   multivitamin with minerals Tabs tablet Take 1 tablet by mouth daily.   nebivolol 5 MG tablet Commonly known as:  BYSTOLIC Take 2.5 mg by mouth at bedtime. Takes 2.5 mg daily   nitroGLYCERIN 0.4 MG SL tablet Commonly known as:  NITROSTAT Place 0.4 mg under the tongue every 5 (five) minutes as needed for chest pain (3 DOSES MAX FOR CHEST PAIN).   omega-3 acid ethyl esters 1 g capsule Commonly known as:  LOVAZA Take 1 g by mouth daily.   omeprazole 20 MG capsule Commonly known as:  PRILOSEC Take 20 mg by mouth every morning.   PROBIOTIC FORMULA PO Take 1 capsule by mouth every morning. For upset stomach   Vitamin D (Ergocalciferol) 50000 units Caps capsule Commonly known as:  DRISDOL Take 50,000 Units by mouth 2 (two) times a week. Takes on Tuesdays and Fridays         Diet and Activity recommendation: See Discharge Instructions above   Consults obtained -  none   Major procedures and Radiology Reports - PLEASE review detailed and final reports for all details, in brief -      Dg Chest 2 View  Result Date: 03/22/2016 CLINICAL DATA:  Short of breath EXAM: CHEST  2 VIEW COMPARISON:  02/02/2016 FINDINGS: Cardiac enlargement.  CABG.  Negative for heart failure. Mild patchy bibasilar atelectasis. No definite pneumonia. Negative for pleural effusion. Spinal cord stimulator unchanged in the thoracic spine IMPRESSION:  Mild bibasilar atelectasis. Negative for heart failure or pneumonia. Electronically Signed   By: Franchot Gallo M.D.   On: 03/22/2016 08:55    Micro Results     Recent Results (from the past 240 hour(s))  Respiratory Panel by PCR     Status: Abnormal   Collection Time: 03/23/16  7:25 AM  Result Value Ref Range Status    Adenovirus NOT DETECTED NOT DETECTED Final   Coronavirus 229E NOT DETECTED NOT DETECTED Final   Coronavirus HKU1 NOT DETECTED NOT DETECTED Final   Coronavirus NL63 NOT DETECTED NOT DETECTED Final   Coronavirus OC43 NOT DETECTED NOT DETECTED Final   Metapneumovirus NOT DETECTED NOT DETECTED Final   Rhinovirus / Enterovirus NOT DETECTED NOT DETECTED Final   Influenza A NOT DETECTED NOT DETECTED Final   Influenza B NOT DETECTED NOT DETECTED Final   Parainfluenza Virus 1 NOT DETECTED NOT DETECTED Final   Parainfluenza Virus 2 NOT DETECTED NOT DETECTED Final   Parainfluenza Virus 3 NOT DETECTED NOT DETECTED Final   Parainfluenza Virus 4 NOT DETECTED NOT DETECTED Final   Respiratory Syncytial Virus DETECTED (A) NOT DETECTED Final    Comment: CRITICAL RESULT CALLED TO, READ BACK BY AND VERIFIED WITH: Shade Flood RN 14:30 03/23/16 ([email or web address])    Bordetella pertussis NOT DETECTED NOT DETECTED Final   Chlamydophila pneumoniae NOT DETECTED NOT DETECTED Final   Mycoplasma pneumoniae NOT DETECTED NOT DETECTED Final       Today   Subjective:   [name] today has no headache,no chest abdominal pain,no new weakness tingling or numbness, feels much better wants to go home today.   Objective:   Blood pressure (!) 172/61, pulse 95, temperature 98.4 F (36.9 C), temperature source Oral, resp. rate 18, height 5\' 4"  (1.626 m), weight 90.8 kg (200 lb 1.6 oz), SpO2 94 %.   Intake/Output Summary (Last 24 hours) at 03/25/16 1224 Last data filed at 03/25/16 0600  Gross per 24 hour  Intake              983 ml  Output              650 ml  Net              333 ml    Exam Awake Alert, Oriented x 3, No new F.N deficits, Normal affect [place].AT,PERRAL Supple Neck,No JVD, No cervical lymphadenopathy appriciated.  Symmetrical Chest wall movement, Good air movement bilaterally, no wheezing RRR,No Gallops,Rubs or new Murmurs, No Parasternal Heave +ve B.Sounds, Abd Soft, Non tender, No organomegaly  appriciated, No rebound -guarding or rigidity. No Cyanosis, Clubbing or edema, No new Rash or bruise  Data Review   CBC w Diff:  Lab Results  Component Value Date   WBC 13.2 (H) 03/23/2016   HGB 13.2 03/23/2016   HCT 40.9 03/23/2016   PLT 213 03/23/2016   LYMPHOPCT 35.4 10/08/2015   MONOPCT 8.1 10/08/2015   EOSPCT 1.3 10/08/2015   BASOPCT 0.5 10/08/2015    CMP:  Lab Results  Component Value Date   NA 140 03/25/2016   K 4.1 03/25/2016   CL 102 03/25/2016   CO2 32 03/25/2016   BUN 17 03/25/2016   CREATININE 0.76 03/25/2016   CREATININE 0.80 12/07/2015   PROT 6.3 (L) 03/22/2016   ALBUMIN 3.3 (L) 03/22/2016   BILITOT 0.9 03/22/2016   ALKPHOS 64 03/22/2016   AST 32 03/22/2016   ALT 21 03/22/2016  .   Total Time in preparing paper work, data evaluation and  todays exam - 35 minutes  Maynor Mwangi M.D on 03/25/2016 at 12:24 PM  [facility]   Office  [phone]

## 2016-03-25 NOTE — Discharge Summary (Signed)
Pt got discharged to home, discharge instructions provided and patient showed understanding to it, IV taken out,Telemonitor DC,pt left unit in wheelchair with all of the belongings accompanied with a friend. 

## 2016-04-10 ENCOUNTER — Telehealth: Payer: Self-pay | Admitting: Pulmonary Disease

## 2016-04-10 MED ORDER — CLONAZEPAM 0.5 MG PO TABS: 0.2500 mg | ORAL_TABLET | Freq: Two times a day (BID) | ORAL | 3 refills | Status: DC | PRN

## 2016-04-10 NOTE — Telephone Encounter (Signed)
Spoke with pt. She is needing a refill on Clonazepam. Pt just saw SN on 03/16/16. Rx has been called in. Nothing further was needed.

## 2016-05-29 ENCOUNTER — Ambulatory Visit (HOSPITAL_COMMUNITY)
Admission: RE | Admit: 2016-05-29 | Discharge: 2016-05-29 | Disposition: A | Discharge status: Home / Self Care | Payer: Medicare Other | Source: Ambulatory Visit | Attending: Internal Medicine | Admitting: Internal Medicine

## 2016-05-29 ENCOUNTER — Encounter (HOSPITAL_COMMUNITY): Payer: Self-pay

## 2016-05-29 ENCOUNTER — Encounter (INDEPENDENT_AMBULATORY_CARE_PROVIDER_SITE_OTHER): Payer: Self-pay

## 2016-05-29 DIAGNOSIS — M810 Age-related osteoporosis without current pathological fracture: Secondary | ICD-10-CM | POA: Diagnosis not present

## 2016-05-29 MED ORDER — ZOLEDRONIC ACID 5 MG/100ML IV SOLN
5.0000 mg | Freq: Once | INTRAVENOUS | Status: AC
  Administered 2016-05-29: 5 mg via INTRAVENOUS
  Filled 2016-05-29: qty 100

## 2016-05-29 MED ORDER — SODIUM CHLORIDE 0.9 % IV SOLN
Freq: Once | INTRAVENOUS | Status: AC
  Administered 2016-05-29: 15:00:00 via INTRAVENOUS

## 2016-05-29 NOTE — Discharge Instructions (Signed)
Zoledronic Acid injection (Paget's Disease, Osteoporosis) / Reclast Infusion   What is this medicine? ZOLEDRONIC ACID (ZOE le dron ik AS id) lowers the amount of calcium loss from bone. It is used to treat Paget's disease and osteoporosis in women. COMMON BRAND NAME(S): Reclast, Zometa What should I tell my health care provider before I take this medicine? They need to know if you have any of these conditions: -aspirin-sensitive asthma -cancer, especially if you are receiving medicines used to treat cancer -dental disease or wear dentures -infection -kidney disease -low levels of calcium in the blood -past surgery on the parathyroid gland or intestines -receiving corticosteroids like dexamethasone or prednisone -an unusual or allergic reaction to zoledronic acid, other medicines, foods, dyes, or preservatives -pregnant or trying to get pregnant -breast-feeding How should I use this medicine? This medicine is for infusion into a vein. It is given by a health care professional in a hospital or clinic setting. Talk to your pediatrician regarding the use of this medicine in children. This medicine is not approved for use in children. What if I miss a dose? It is important not to miss your dose. Call your doctor or health care professional if you are unable to keep an appointment. What may interact with this medicine? -certain antibiotics given by injection -NSAIDs, medicines for pain and inflammation, like ibuprofen or naproxen -some diuretics like bumetanide, furosemide -teriparatide What should I watch for while using this medicine? Visit your doctor or health care professional for regular checkups. It may be some time before you see the benefit from this medicine. Do not stop taking your medicine unless your doctor tells you to. Your doctor may order blood tests or other tests to see how you are doing. Women should inform their doctor if they wish to become pregnant or think they might  be pregnant. There is a potential for serious side effects to an unborn child. Talk to your health care professional or pharmacist for more information. You should make sure that you get enough calcium and vitamin D while you are taking this medicine. Discuss the foods you eat and the vitamins you take with your health care professional. Some people who take this medicine have severe bone, joint, and/or muscle pain. This medicine may also increase your risk for jaw problems or a broken thigh bone. Tell your doctor right away if you have severe pain in your jaw, bones, joints, or muscles. Tell your doctor if you have any pain that does not go away or that gets worse. Tell your dentist and dental surgeon that you are taking this medicine. You should not have major dental surgery while on this medicine. See your dentist to have a dental exam and fix any dental problems before starting this medicine. Take good care of your teeth while on this medicine. Make sure you see your dentist for regular follow-up appointments. What side effects may I notice from receiving this medicine? Side effects that you should report to your doctor or health care professional as soon as possible: -allergic reactions like skin rash, itching or hives, swelling of the face, lips, or tongue -anxiety, confusion, or depression -breathing problems -changes in vision -eye pain -feeling faint or lightheaded, falls -jaw pain, especially after dental work -mouth sores -muscle cramps, stiffness, or weakness -redness, blistering, peeling or loosening of the skin, including inside the mouth -trouble passing urine or change in the amount of urine Side effects that usually do not require medical attention (report to your doctor  or health care professional if they continue or are bothersome): -bone, joint, or muscle pain -constipation -diarrhea -fever -hair loss -irritation at site where injected -loss of appetite -nausea,  vomiting -stomach upset -trouble sleeping -trouble swallowing -weak or tired Where should I keep my medicine? This drug is given in a hospital or clinic and will not be stored at home.  2017 Elsevier/Gold Standard (2013-08-16 14:19:57)

## 2016-06-19 ENCOUNTER — Ambulatory Visit (INDEPENDENT_AMBULATORY_CARE_PROVIDER_SITE_OTHER): Payer: Medicare Other | Admitting: Interventional Cardiology

## 2016-06-19 ENCOUNTER — Encounter: Payer: Self-pay | Admitting: Interventional Cardiology

## 2016-06-19 ENCOUNTER — Telehealth: Payer: Self-pay | Admitting: Interventional Cardiology

## 2016-06-19 VITALS — BP 124/66 | HR 66 | Ht 64.0 in | Wt 206.0 lb

## 2016-06-19 DIAGNOSIS — I481 Persistent atrial fibrillation: Secondary | ICD-10-CM

## 2016-06-19 DIAGNOSIS — I10 Essential (primary) hypertension: Secondary | ICD-10-CM

## 2016-06-19 DIAGNOSIS — Z9229 Personal history of other drug therapy: Secondary | ICD-10-CM

## 2016-06-19 DIAGNOSIS — Z7901 Long term (current) use of anticoagulants: Secondary | ICD-10-CM | POA: Diagnosis not present

## 2016-06-19 DIAGNOSIS — I4819 Other persistent atrial fibrillation: Secondary | ICD-10-CM

## 2016-06-19 DIAGNOSIS — I25709 Atherosclerosis of coronary artery bypass graft(s), unspecified, with unspecified angina pectoris: Secondary | ICD-10-CM | POA: Diagnosis not present

## 2016-06-19 NOTE — Telephone Encounter (Signed)
Have her to come in today for EKG and office visit

## 2016-06-19 NOTE — Telephone Encounter (Signed)
Spoke with pt and she states that she believes she may be back in Afib.  For the last 2 weeks pt has been more fatigued and feels like she can "feel her heart skipping every third beat."  Denies CP, SOB, dizziness or syncope.  Last night pt "didn't feel well" and kept belching.  Pt attempted to check her vitals and machine kept showing error, as did the neighbors machine.  This concerned pt so she had EMS come out.  EMS told her she is likely back in Afib and that is why the monitor wasn't registering.  Pt opted not to go with EMS and contact our office today instead.  Advised I would send message to [name] for review and advisement.

## 2016-06-19 NOTE — Telephone Encounter (Signed)
New Message ° °Pt call requesting to speak with RN. Pt states she would rather discuss with RN. Please call back to discuss  °

## 2016-06-19 NOTE — Telephone Encounter (Signed)
Follow up    Pt is returning phone call. She says she should be there the rest of the afternoon.

## 2016-06-19 NOTE — Progress Notes (Signed)
Cardiology Office Note    Date:  06/19/2016   ID:  Sande, Pickert 08/29/1940, MRN [id number]  PCP:  Jerlyn Ly, MD  Cardiologist: Sinclair Grooms, MD   Chief Complaint  Patient presents with  . Atrial Fibrillation    History of Present Illness:  [name] is a 76 y.o. female follow-up of coronary artery disease with prior bypass grafting, atrial fibrillation resulting in acute diastolic heart failure, hypertension, chronic anticoagulation therapy with Eliquis and hyperlipidemia. Amiodarone was discontinued in December 2017.  Lately she has been sluggish. She feels irregular heart beats. She has had more shortness of breath and usual. Feels that if she tries to rest she can feel her heart skipping. This frightens her. She is worried that she is back in atrial fibrillation. She has continued to have chest tightness from time to time. This is not significantly different than it was at the time of heart cath in June 2016.  This is a work in because of the above complaints. She called 911 last evening and they came out to her house and told her she was in atrial fibrillation. They felt she was stable and did not need transport to any emergency facility.   Past Medical History:  Diagnosis Date  . Addison's disease ([place])   . Angina   . Anxiety   . Arthritis    "everywhere"  . Atrial fibrillation ([place])   . Chronic lower back pain   . 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  last knee surgery dr. Elmyra Ricks gave large dose of hydrocortisal and avoided the hypotensive side effectas of addison's disease.  . Coronary artery disease   . Depression   . Dysrhythmia   . GERD (gastroesophageal reflux disease)   . Hyperlipemia   . Hypertension   . Hypothyroidism   . Lumbar radiculopathy 04/27/2014  . PONV (postoperative nausea and vomiting)   . Recurrent upper respiratory infection (URI)   . Shortness of breath     Past  Surgical History:  Procedure Laterality Date  . ACHILLES TENDON SURGERY  07/16/14   left ankle, bone spur removed  . ANTERIOR LUMBAR FUSION  1980   L4-5  . BACK SURGERY    . CARDIAC CATHETERIZATION  2013  . CARDIAC CATHETERIZATION N/A 09/02/2014   Procedure: Left Heart Cath and Coronary Angiography;  Surgeon: Wellington Hampshire, MD;  Location: [facility];  Service: Cardiovascular;  Laterality: N/A;  . CARDIOVERSION N/A 10/22/2015   Procedure: CARDIOVERSION;  Surgeon: Sanda Klein, MD;  [facility];  Service: Cardiovascular;  Laterality: N/A;  . CARPAL TUNNEL RELEASE Right   . CATARACT EXTRACTION, BILATERAL Bilateral   . CORONARY ARTERY BYPASS GRAFT  06/26/2011   Procedure: CORONARY ARTERY BYPASS GRAFTING (CABG);  Surgeon: Melrose Nakayama, MD;  Location: [facility];  Service: Open Heart Surgery;  Laterality: N/A;  times using Greater Saphenous Vein Graft harvested endoscopically from right leg  . DIAGNOSTIC LAPAROSCOPY    . DILATION AND CURETTAGE OF UTERUS    . JOINT REPLACEMENT    . KNEE ARTHROSCOPY Bilateral   . LAPAROSCOPIC CHOLECYSTECTOMY    . Nerve stimulator of back  12/10/15  . REVISION TOTAL KNEE ARTHROPLASTY Right   . TONSILLECTOMY    . TOTAL KNEE ARTHROPLASTY Bilateral     Current Medications: Outpatient Medications Prior to Visit  Medication Sig Dispense Refill  . acetaminophen (TYLENOL) 500 MG tablet Take 500 mg  by mouth daily as needed for headache.    . albuterol (PROVENTIL HFA;VENTOLIN HFA) 108 (90 Base) MCG/ACT inhaler Inhale 2 puffs into the lungs every 6 (six) hours as needed for wheezing or shortness of breath. 1 Inhaler 2  . amLODipine (NORVASC) 5 MG tablet Take 1 tablet (5 mg total) by mouth daily. 30 tablet 6  . apixaban (ELIQUIS) 5 MG TABS tablet Take 1 tablet (5 mg total) by mouth 2 (two) times daily. 60 tablet 11  . aspirin EC 81 MG tablet Take 81 mg by mouth every Monday, Wednesday, and Friday.    . clonazePAM (KLONOPIN) 0.5 MG tablet Take 0.5-1  tablets (0.25-0.5 mg total) by mouth 2 (two) times daily as needed for anxiety. 60 tablet 3  . CYANOCOBALAMIN IJ Inject as directed every 30 (thirty) days.    [name] FLUoxetine (PROZAC) 20 MG capsule Take 20 mg by mouth every morning.    . furosemide (LASIX) 20 MG tablet Take 20 mg by mouth as needed for fluid or edema.    [name] guaiFENesin (MUCINEX) 600 MG 12 hr tablet Take 2 tablets (1,200 mg total) by mouth 2 (two) times daily. (Patient taking differently: Take 1,200 mg by mouth 2 (two) times daily. ) 20 tablet 0  . HYDROcodone-acetaminophen (NORCO/VICODIN) 5-325 MG tablet Take 1-2 tablets by mouth every 6 (six) hours as needed for severe pain. 12 tablet 0  . hydrocortisone (CORTEF) 20 MG tablet Take 20 mg by mouth 2 (two) times daily.    . hyoscyamine (LEVSIN SL) 0.125 MG SL tablet Place 0.125 mg under the tongue daily as needed for cramping (IBS symptoms).    . irbesartan (AVAPRO) 300 MG tablet Take 300 mg by mouth daily.     [name] levothyroxine (SYNTHROID, LEVOTHROID) 88 MCG tablet Take 1 tablet (88 mcg total) by mouth daily before breakfast. 30 tablet 0  . Multiple Vitamin (MULTIVITAMIN WITH MINERALS) TABS tablet Take 1 tablet by mouth daily.    . nebivolol (BYSTOLIC) 5 MG tablet Take 2.5 mg by mouth at bedtime. Takes 2.5 mg daily    . nitroGLYCERIN (NITROSTAT) 0.4 MG SL tablet Place 0.4 mg under the tongue every 5 (five) minutes as needed for chest pain (3 DOSES MAX FOR CHEST PAIN).    [name] omega-3 acid ethyl esters (LOVAZA) 1 g capsule Take 1 g by mouth daily.    [name] omeprazole (PRILOSEC) 20 MG capsule Take 20 mg by mouth every morning.     . Probiotic Product (PROBIOTIC FORMULA PO) Take 1 capsule by mouth every morning. For upset stomach    . Vitamin D, Ergocalciferol, (DRISDOL) 50000 UNITS CAPS Take 50,000 Units by mouth 2 (two) times a week. Takes on Tuesdays and Fridays     No facility-administered medications prior to visit.      Allergies:   Zanaflex [tizanidine]; Percocet  [oxycodone-acetaminophen]; Sulfa antibiotics; and Sulfacetamide sodium   Social History   Social History  . Marital status: Widowed    Spouse name: N/A  . Number of children: N/A  . Years of education: N/A   Occupational History  . Retired    Social History Main Topics  . Smoking status: Passive Smoke Exposure - Never Smoker    Packs/day: 0.00    Years: 0.00  . Smokeless tobacco: Never Used  . Alcohol use No  . Drug use: No  . Sexual activity: No   Other Topics Concern  . None   Social History Narrative  . None     Family  History:  The patient's family history includes Heart attack in her father; Hypertension in her maternal grandfather and mother; Stroke in her mother.   ROS:   Please see the history of present illness.    Feels okay other than anxiety, snoring, skipped heartbeats, excessive fatigue, and shortness of breath.  All other systems reviewed and are negative.   PHYSICAL EXAM:   VS:  BP 124/66   Pulse 66   Ht 5\' 4"  (1.626 m)   Wt 206 lb (93.4 kg)   BMI 35.36 kg/m    GEN: Well nourished, well developed, in no acute distress  HEENT: normal  Neck: no JVD, carotid bruits, or masses Cardiac: IRR; no murmurs, rubs, or gallops,no edema  Respiratory:  clear to auscultation bilaterally, normal work of breathing GI: soft, nontender, nondistended, + BS MS: no deformity or atrophy  Skin: warm and dry, no rash Neuro:  Alert and Oriented x 3, Strength and sensation are intact Psych: euthymic mood, full affect  Wt Readings from Last 3 Encounters:  06/19/16 206 lb (93.4 kg)  05/29/16 204 lb 12.8 oz (92.9 kg)  03/25/16 200 lb 1.6 oz (90.8 kg)      Studies/Labs Reviewed:   EKG:  EKG  Atrial bigeminy. Atrial abnormality. Rate 66 bpm.  Recent Labs: 12/07/2015: Magnesium 1.8 03/22/2016: ALT 21; B Natriuretic Peptide 450.4 03/23/2016: Hemoglobin 13.2; Platelets 213 03/25/2016: BUN 17; Creatinine, Ser 0.76; Potassium 4.1; Sodium 140   Lipid Panel      Component Value Date/Time   CHOL 100 09/02/2014 0346   TRIG 78 09/02/2014 0346   HDL 40 (L) 09/02/2014 0346   CHOLHDL 2.5 09/02/2014 0346   VLDL 16 09/02/2014 0346   LDLCALC 44 09/02/2014 0346    Additional studies/ records that were reviewed today include:  None    ASSESSMENT:    1. Persistent atrial fibrillation ([place])   2. Coronary artery disease involving coronary bypass graft of native heart with angina pectoris ([place])   3. Essential hypertension   4. Chronic anticoagulation   5. History of amiodarone therapy      PLAN:  In order of problems listed above:  1. Atrial fibrillation has not recurred despite discontinuation of amiodarone 3 months ago. No current symptoms of heart failure. Palpitations due to atrial bigeminy. 2. Continues to complain of intermittent episodes of chest pressure. Heart catheterization under similar circumstances 2 years ago did not reveal any evidence of bypass graft occlusion. 3. Well-controlled blood pressure. No change in therapy. 4. No bleeding complications noted to this point. 5. Therapy was discontinued because of patient concerned about possible toxicity in the future.  Continue clinical observation. Keep appointment in June. Call before that time if fatigue, dyspnea, edema, or other problems.    Medication Adjustments/Labs and Tests Ordered: Current medicines are reviewed at length with the patient today.  Concerns regarding medicines are outlined above.  Medication changes, Labs and Tests ordered today are listed in the Patient Instructions below. Patient Instructions  Medication Instructions:  None  Labwork: None  Testing/Procedures: None  Follow-Up: Keep current follow up.  Any Other Special Instructions Will Be Listed Below (If Applicable).  Your physician discussed the importance of regular exercise and recommended that you start or continue a regular exercise program for good health.    If you need a refill on your  cardiac medications before your next appointment, please call your pharmacy.      Signed, Sinclair Grooms, MD  06/19/2016 4:43 PM  [facility] [place], [place], [place]  [address] Phone: [phone]; Fax: [phone]

## 2016-06-19 NOTE — Telephone Encounter (Signed)
Left message with lady who answered the phone to have pt call back.

## 2016-06-19 NOTE — Patient Instructions (Signed)
Medication Instructions:  None  Labwork: None  Testing/Procedures: None  Follow-Up: Keep current follow up.  Any Other Special Instructions Will Be Listed Below (If Applicable).  Your physician discussed the importance of regular exercise and recommended that you start or continue a regular exercise program for good health.    If you need a refill on your cardiac medications before your next appointment, please call your pharmacy.

## 2016-06-19 NOTE — Telephone Encounter (Signed)
Spoke with [name] and scheduled her to come in today at 4pm to see [name].

## 2016-08-23 ENCOUNTER — Telehealth: Payer: Self-pay | Admitting: Interventional Cardiology

## 2016-08-23 NOTE — Progress Notes (Signed)
Cardiology Office Note    Date:  08/24/2016   ID:  Deneshia, Zucker [birth year], MRN [id number]  PCP:  Crist Infante, MD  Cardiologist: Sinclair Grooms, MD   Chief Complaint  Patient presents with  . Atrial Fibrillation    History of Present Illness:  [name] is a 76 y.o. female follow-up of coronary artery disease with prior bypass grafting, atrial fibrillation resulting in acute diastolic heart failure, hypertension, chronic anticoagulation therapy with Eliquis and hyperlipidemia. Was on amiodarone which was subsequently discontinued.    "She Spoke with pt and she states that for the last 2 weeks her HR has been "all over the place".  I noticed pt did have some SOB when she picked up the phone that resolved prior to hanging up.  Pt states all last night her HR was 30-31.  This AM was in the 60-101 range.  HR during the day yesterday was 172 and 199.  Denies dizziness, lightheadedness or CP.  States legs are very weak, she can barely participate in biking for PTeturns today after calling with the following complaint:"  For the past month she has had exertional fatigue. She purchased a pulse oximeter and has noted heart rates ranging between 30 bpm and 130 bpm. She has no energy when she is walking. She feels as though her legs are very weak. She has not had syncope. She has been having palpitations. She also states that her current symptoms are similar to pre-bypass surgery. She has not had chest discomfort.   Past Medical History:  Diagnosis Date  . Addison's disease ([place])   . Angina   . Anxiety   . Arthritis    "everywhere"  . Atrial fibrillation ([place])   . Chronic lower back pain   . 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  last knee surgery dr. Elmyra Ricks gave large dose of hydrocortisal and avoided the hypotensive side effectas of addison's disease.  . Coronary artery disease   . Depression   . Dysrhythmia   . GERD  (gastroesophageal reflux disease)   . Hyperlipemia   . Hypertension   . Hypothyroidism   . Lumbar radiculopathy 04/27/2014  . PONV (postoperative nausea and vomiting)   . Recurrent upper respiratory infection (URI)   . Shortness of breath     Past Surgical History:  Procedure Laterality Date  . ACHILLES TENDON SURGERY  07/16/14   left ankle, bone spur removed  . ANTERIOR LUMBAR FUSION  1980   L4-5  . BACK SURGERY    . CARDIAC CATHETERIZATION  2013  . CARDIAC CATHETERIZATION N/A 09/02/2014   Procedure: Left Heart Cath and Coronary Angiography;  Surgeon: Wellington Hampshire, MD;  Location: [facility];  Service: Cardiovascular;  Laterality: N/A;  . CARDIOVERSION N/A 10/22/2015   Procedure: CARDIOVERSION;  Surgeon: Sanda Klein, MD;  [facility];  Service: Cardiovascular;  Laterality: N/A;  . CARPAL TUNNEL RELEASE Right   . CATARACT EXTRACTION, BILATERAL Bilateral   . CORONARY ARTERY BYPASS GRAFT  06/26/2011   Procedure: CORONARY ARTERY BYPASS GRAFTING (CABG);  Surgeon: Melrose Nakayama, MD;  Location: [facility];  Service: Open Heart Surgery;  Laterality: N/A;  times using Greater Saphenous Vein Graft harvested endoscopically from right leg  . DIAGNOSTIC LAPAROSCOPY    . DILATION AND CURETTAGE OF UTERUS    . JOINT REPLACEMENT    . KNEE ARTHROSCOPY Bilateral   . LAPAROSCOPIC CHOLECYSTECTOMY    .  Nerve stimulator of back  12/10/15  . REVISION TOTAL KNEE ARTHROPLASTY Right   . TONSILLECTOMY    . TOTAL KNEE ARTHROPLASTY Bilateral     Current Medications: Outpatient Medications Prior to Visit  Medication Sig Dispense Refill  . acetaminophen (TYLENOL) 500 MG tablet Take 500 mg by mouth daily as needed for headache.    [name] amLODipine (NORVASC) 5 MG tablet Take 1 tablet (5 mg total) by mouth daily. 30 tablet 6  . apixaban (ELIQUIS) 5 MG TABS tablet Take 1 tablet (5 mg total) by mouth 2 (two) times daily. 60 tablet 11  . aspirin EC 81 MG tablet Take 81 mg by mouth every Monday,  Wednesday, and Friday.    . clonazePAM (KLONOPIN) 0.5 MG tablet Take 0.5-1 tablets (0.25-0.5 mg total) by mouth 2 (two) times daily as needed for anxiety. 60 tablet 3  . CYANOCOBALAMIN IJ Inject as directed every 30 (thirty) days.    [name] FLUoxetine (PROZAC) 20 MG capsule Take 20 mg by mouth every morning.    . furosemide (LASIX) 20 MG tablet Take 20 mg by mouth as needed for fluid or edema.    [name] guaiFENesin (MUCINEX) 600 MG 12 hr tablet Take 2 tablets (1,200 mg total) by mouth 2 (two) times daily. (Patient taking differently: Take 1,200 mg by mouth 2 (two) times daily. ) 20 tablet 0  . hydrocortisone (CORTEF) 20 MG tablet Take 20 mg by mouth 2 (two) times daily.    . hyoscyamine (LEVSIN SL) 0.125 MG SL tablet Place 0.125 mg under the tongue daily as needed for cramping (IBS symptoms).    . irbesartan (AVAPRO) 300 MG tablet Take 300 mg by mouth daily.     [name] levothyroxine (SYNTHROID, LEVOTHROID) 88 MCG tablet Take 1 tablet (88 mcg total) by mouth daily before breakfast. 30 tablet 0  . nebivolol (BYSTOLIC) 5 MG tablet Take 2.5 mg by mouth at bedtime. Takes 2.5 mg daily    . nitroGLYCERIN (NITROSTAT) 0.4 MG SL tablet Place 0.4 mg under the tongue every 5 (five) minutes as needed for chest pain (3 DOSES MAX FOR CHEST PAIN).    [name] omeprazole (PRILOSEC) 20 MG capsule Take 20 mg by mouth every morning.     . Probiotic Product (PROBIOTIC FORMULA PO) Take 1 capsule by mouth every morning. For upset stomach    . Vitamin D, Ergocalciferol, (DRISDOL) 50000 UNITS CAPS Take 50,000 Units by mouth 2 (two) times a week. Takes on Tuesdays and Fridays    . omega-3 acid ethyl esters (LOVAZA) 1 g capsule Take 1 g by mouth daily.    [name] albuterol (PROVENTIL HFA;VENTOLIN HFA) 108 (90 Base) MCG/ACT inhaler Inhale 2 puffs into the lungs every 6 (six) hours as needed for wheezing or shortness of breath. (Patient not taking: Reported on 08/24/2016) 1 Inhaler 2  . HYDROcodone-acetaminophen (NORCO/VICODIN) 5-325 MG tablet Take 1-2  tablets by mouth every 6 (six) hours as needed for severe pain. (Patient not taking: Reported on 08/24/2016) 12 tablet 0  . Multiple Vitamin (MULTIVITAMIN WITH MINERALS) TABS tablet Take 1 tablet by mouth daily.     No facility-administered medications prior to visit.      Allergies:   Zanaflex [tizanidine]; Percocet [oxycodone-acetaminophen]; Sulfa antibiotics; and Sulfacetamide sodium   Social History   Social History  . Marital status: Widowed    Spouse name: N/A  . Number of children: N/A  . Years of education: N/A   Occupational History  . Retired    Social History Main  Topics  . Smoking status: Passive Smoke Exposure - Never Smoker    Packs/day: 0.00    Years: 0.00  . Smokeless tobacco: Never Used  . Alcohol use No  . Drug use: No  . Sexual activity: No   Other Topics Concern  . None   Social History Narrative  . None     Family History:  The patient's family history includes Heart attack in her father; Hypertension in her maternal grandfather and mother; Stroke in her mother.   ROS:   Please see the history of present illness.    Legs are weak. Occasional palpitations as noted.Very anxious.  All other systems reviewed and are negative.   PHYSICAL EXAM:   VS:  BP 124/76   Pulse 62   Ht 5\' 4"  (1.626 m)   Wt 202 lb (91.6 kg)   BMI 34.67 kg/m    GEN: Well nourished, well developed, in no acute distress  HEENT: normal  Neck: no JVD, carotid bruits, or masses Cardiac: RRR; no murmurs, rubs, or gallops,no edema . There are 1+ bilateral posterior pulses bilaterally Respiratory:  clear to auscultation bilaterally, normal work of breathing GI: soft, nontender, nondistended, + BS MS: no deformity or atrophy  Skin: warm and dry, no rash Neuro:  Alert and Oriented x 3, Strength and sensation are intact Psych: euthymic mood, full affect  Wt Readings from Last 3 Encounters:  08/24/16 202 lb (91.6 kg)  06/19/16 206 lb (93.4 kg)  05/29/16 204 lb 12.8 oz (92.9 kg)        Studies/Labs Reviewed:   EKG:  EKG  Atrial bigeminy  Recent Labs: 12/07/2015: Magnesium 1.8 03/22/2016: ALT 21; B Natriuretic Peptide 450.4 03/23/2016: Hemoglobin 13.2; Platelets 213 03/25/2016: BUN 17; Creatinine, Ser 0.76; Potassium 4.1; Sodium 140   Lipid Panel    Component Value Date/Time   CHOL 100 09/02/2014 0346   TRIG 78 09/02/2014 0346   HDL 40 (L) 09/02/2014 0346   CHOLHDL 2.5 09/02/2014 0346   VLDL 16 09/02/2014 0346   LDLCALC 44 09/02/2014 0346    Additional studies/ records that were reviewed today include:  48 hour Holter monitor performed 09/20/2015:Study Highlights   Continuous atrial fibrillation  Average HR 71 bpm with range 61-121 bpm  Rare PVC's   Continuous atrial fib with rate control.       ASSESSMENT:    1. Paroxysmal atrial fibrillation (HCC)   2. Coronary artery disease involving coronary bypass graft of native heart with angina pectoris ([place])   3. Essential hypertension   4. Chronic anticoagulation   5. SOB (shortness of breath)      PLAN:  In order of problems listed above:  1. Postoperative atrial fibrillation has not occurred as best we can tell. She was on amiodarone post surgery which has subsequently been discontinued. We need to exclude the possibility of paroxysmal atrial fibrillation and/or tachybradycardia syndrome given the above complaints. A 30 day continuous monitor has been ordered. 2. If blood work and monitoring does not demonstrate an explanation for the patient's complaints, she may need to have a repeat cath performed because before revascularization her symptoms were somewhat similar. 3. Excellent control. 4. Continue current therapy with Eliquis taking aspirin only 2 or 3 times per week with at least 48 hour interval between doses. 5. Hemoglobin, BNP, and electrolytes will be obtained. A 48-hour to 30 day monitor will be done to exclude the tachybradycardia syndrome/atrial fibrillation.  Further evaluation  and management will be dependent upon laboratory  data and findings on monitoring. If no arrhythmia, and normal chronotropic response, with then need to consider possibility of bypass graft occlusion. I do have some concern that the lower leg weakness could be related to lumbar disc disease/spinal stenosis.   Medication Adjustments/Labs and Tests Ordered: Current medicines are reviewed at length with the patient today.  Concerns regarding medicines are outlined above.  Medication changes, Labs and Tests ordered today are listed in the Patient Instructions below. Patient Instructions  Medication Instructions:  None  Labwork: BNP, CBC, BMET today  Testing/Procedures: Your physician has recommended that you wear an event monitor. Event monitors are medical devices that record the heart's electrical activity. Doctors most often [place] these monitors to diagnose arrhythmias. Arrhythmias are problems with the speed or rhythm of the heartbeat. The monitor is a small, portable device. You can wear one while you do your normal daily activities. This is usually used to diagnose what is causing palpitations/syncope (passing out).   Follow-Up: Your physician recommends that you schedule a follow-up appointment as already scheduled.   Any Other Special Instructions Will Be Listed Below (If Applicable).     If you need a refill on your cardiac medications before your next appointment, please call your pharmacy.      Signed, Sinclair Grooms, MD  08/24/2016 2:15 PM    [facility] [place], [place], [place]  [address] Phone: [phone]; Fax: [phone]

## 2016-08-23 NOTE — Telephone Encounter (Signed)
She needs to come in and be seen as soon as possible. She will need to have an EKG. I suspect she may be in atrial fibrillation.

## 2016-08-23 NOTE — Telephone Encounter (Signed)
Per pt call she has not been doing well for the last couple of weeks.  SoB and heart rate increasing and decreasing in 30's.   Would like to speak to someone.

## 2016-08-23 NOTE — Telephone Encounter (Signed)
Spoke with pt and she states that for the last 2 weeks her HR has been "all over the place".  I noticed pt did have some SOB when she picked up the phone that resolved prior to hanging up.  Pt states all last night her HR was 30-31.  This AM was in the 60-101 range.  HR during the day yesterday was 172 and 199.  Denies dizziness, lightheadedness or CP.  States legs are very weak, she can barely participate in biking for PT.  HR currently 64.  Pt with hx of Afib and had DCCV 10/2015.  Advised I would send message to [name] for review and advisement.

## 2016-08-23 NOTE — Telephone Encounter (Signed)
Spoke with pt and scheduled her to see [name] tomorrow at [facility] if anything worsens or CP develops to go to ER.  Pt verbalized understanding and was appreciative for call.

## 2016-08-24 ENCOUNTER — Encounter: Payer: Self-pay | Admitting: Interventional Cardiology

## 2016-08-24 ENCOUNTER — Ambulatory Visit (INDEPENDENT_AMBULATORY_CARE_PROVIDER_SITE_OTHER): Payer: Medicare Other | Admitting: Interventional Cardiology

## 2016-08-24 VITALS — BP 124/76 | HR 62 | Ht 64.0 in | Wt 202.0 lb

## 2016-08-24 DIAGNOSIS — Z7901 Long term (current) use of anticoagulants: Secondary | ICD-10-CM

## 2016-08-24 DIAGNOSIS — I25709 Atherosclerosis of coronary artery bypass graft(s), unspecified, with unspecified angina pectoris: Secondary | ICD-10-CM | POA: Diagnosis not present

## 2016-08-24 DIAGNOSIS — I480 Paroxysmal atrial fibrillation: Secondary | ICD-10-CM

## 2016-08-24 DIAGNOSIS — R0602 Shortness of breath: Secondary | ICD-10-CM

## 2016-08-24 DIAGNOSIS — I10 Essential (primary) hypertension: Secondary | ICD-10-CM | POA: Diagnosis not present

## 2016-08-24 NOTE — Patient Instructions (Signed)
Medication Instructions:  None  Labwork: BNP, CBC, BMET today  Testing/Procedures: Your physician has recommended that you wear an event monitor. Event monitors are medical devices that record the heart's electrical activity. Doctors most often [place] these monitors to diagnose arrhythmias. Arrhythmias are problems with the speed or rhythm of the heartbeat. The monitor is a small, portable device. You can wear one while you do your normal daily activities. This is usually used to diagnose what is causing palpitations/syncope (passing out).   Follow-Up: Your physician recommends that you schedule a follow-up appointment as already scheduled.   Any Other Special Instructions Will Be Listed Below (If Applicable).     If you need a refill on your cardiac medications before your next appointment, please call your pharmacy.

## 2016-08-25 LAB — BASIC METABOLIC PANEL
BUN / CREAT RATIO: 17 (ref 12–28)
BUN: 16 mg/dL (ref 8–27)
CALCIUM: 9.3 mg/dL (ref 8.7–10.3)
CHLORIDE: 105 mmol/L (ref 96–106)
CO2: 23 mmol/L (ref 18–29)
Creatinine, Ser: 0.94 mg/dL (ref 0.57–1.00)
GFR, EST AFRICAN AMERICAN: 68 mL/min/{1.73_m2} (ref >59)
GFR, EST NON AFRICAN AMERICAN: 59 mL/min/{1.73_m2} — AB (ref >59)
Glucose: 111 mg/dL — ABNORMAL HIGH (ref 65–99)
Potassium: 4.3 mmol/L (ref 3.5–5.2)
Sodium: 143 mmol/L (ref 134–144)

## 2016-08-25 LAB — CBC
HEMATOCRIT: 39.5 % (ref 34.0–46.6)
HEMOGLOBIN: 13 g/dL (ref 11.1–15.9)
MCH: 29 pg (ref 26.6–33.0)
MCHC: 32.9 g/dL (ref 31.5–35.7)
MCV: 88 fL (ref 79–97)
Platelets: 291 10*3/uL (ref 150–379)
RBC: 4.49 x10E6/uL (ref 3.77–5.28)
RDW: 13.8 % (ref 12.3–15.4)
WBC: 10.9 10*3/uL — AB (ref 3.4–10.8)

## 2016-08-25 LAB — PRO B NATRIURETIC PEPTIDE: NT-Pro BNP: 528 pg/mL (ref 0–738)

## 2016-08-31 ENCOUNTER — Other Ambulatory Visit: Payer: Self-pay | Admitting: Physician Assistant

## 2016-08-31 DIAGNOSIS — M76892 Other specified enthesopathies of left lower limb, excluding foot: Secondary | ICD-10-CM

## 2016-09-01 ENCOUNTER — Ambulatory Visit (INDEPENDENT_AMBULATORY_CARE_PROVIDER_SITE_OTHER): Payer: Medicare Other

## 2016-09-01 DIAGNOSIS — I480 Paroxysmal atrial fibrillation: Secondary | ICD-10-CM | POA: Diagnosis not present

## 2016-09-05 ENCOUNTER — Other Ambulatory Visit: Payer: Self-pay | Admitting: Interventional Cardiology

## 2016-09-05 DIAGNOSIS — I4819 Other persistent atrial fibrillation: Secondary | ICD-10-CM

## 2016-09-05 NOTE — Telephone Encounter (Signed)
Age 63yrs Wt 91.6kg (08/24/2016) Saw  Dr Tamala Julian on 08/24/2016 08/23/2016 SrCr 0.94  Hgb 13.0 HCT 39.5 Refill done for Eliquis 5mg  q 12 hours as requested

## 2016-09-12 ENCOUNTER — Ambulatory Visit
Admission: RE | Admit: 2016-09-12 | Discharge: 2016-09-12 | Disposition: A | Discharge status: Home / Self Care | Payer: Medicare Other | Source: Ambulatory Visit | Attending: Physician Assistant | Admitting: Physician Assistant

## 2016-09-12 DIAGNOSIS — M76892 Other specified enthesopathies of left lower limb, excluding foot: Secondary | ICD-10-CM

## 2016-09-25 NOTE — Progress Notes (Signed)
Cardiology Office Note    Date:  09/27/2016   ID:  Abigail, Scott October 20, 1940, MRN [id number]  PCP:  Crist Infante, MD  Cardiologist: Abigail Grooms, MD   Chief Complaint  Patient presents with  . Coronary Artery Disease  . Atrial Fibrillation    History of Present Illness:  [name] is a 76 y.o. female  follow-up of coronary artery disease with prior bypass grafting, atrial fibrillation resulting in acute diastolic heart failure, hypertension, chronic anticoagulation therapy with Eliquis and hyperlipidemia. Was on amiodarone which was subsequently discontinued  She now feels much better. She feels that her path and was causing her symptoms of dyspnea. She can now walk without any difficulty. She denies chest pain. No orthopnea or PND. No peripheral edema. She still continues to have occasional irregularity of heartbeat. She is completing the 30 day monitor.   Past Medical History:  Diagnosis Date  . Addison's disease ([place])   . Angina   . Anxiety   . Arthritis    "everywhere"  . Atrial fibrillation ([place])   . Chronic lower back pain   . 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  last knee surgery dr. Elmyra Ricks gave large dose of hydrocortisal and avoided the hypotensive side effectas of addison's disease.  . Coronary artery disease   . Depression   . Dysrhythmia   . GERD (gastroesophageal reflux disease)   . Hyperlipemia   . Hypertension   . Hypothyroidism   . Lumbar radiculopathy 04/27/2014  . PONV (postoperative nausea and vomiting)   . Recurrent upper respiratory infection (URI)   . Shortness of breath     Past Surgical History:  Procedure Laterality Date  . ACHILLES TENDON SURGERY  07/16/14   left ankle, bone spur removed  . ANTERIOR LUMBAR FUSION  1980   L4-5  . BACK SURGERY    . CARDIAC CATHETERIZATION  2013  . CARDIAC CATHETERIZATION N/A 09/02/2014   Procedure: Left Heart Cath and Coronary  Angiography;  Surgeon: Wellington Hampshire, MD;  Location: [facility];  Service: Cardiovascular;  Laterality: N/A;  . CARDIOVERSION N/A 10/22/2015   Procedure: CARDIOVERSION;  Surgeon: Sanda Klein, MD;  [facility];  Service: Cardiovascular;  Laterality: N/A;  . CARPAL TUNNEL RELEASE Right   . CATARACT EXTRACTION, BILATERAL Bilateral   . CORONARY ARTERY BYPASS GRAFT  06/26/2011   Procedure: CORONARY ARTERY BYPASS GRAFTING (CABG);  Surgeon: Melrose Nakayama, MD;  Location: [facility];  Service: Open Heart Surgery;  Laterality: N/A;  times using Greater Saphenous Vein Graft harvested endoscopically from right leg  . DIAGNOSTIC LAPAROSCOPY    . DILATION AND CURETTAGE OF UTERUS    . JOINT REPLACEMENT    . KNEE ARTHROSCOPY Bilateral   . LAPAROSCOPIC CHOLECYSTECTOMY    . Nerve stimulator of back  12/10/15  . REVISION TOTAL KNEE ARTHROPLASTY Right   . TONSILLECTOMY    . TOTAL KNEE ARTHROPLASTY Bilateral     Current Medications: Outpatient Medications Prior to Visit  Medication Sig Dispense Refill  . acetaminophen (TYLENOL) 500 MG tablet Take 500 mg by mouth daily as needed for headache.    [name] amLODipine (NORVASC) 5 MG tablet Take 1 tablet (5 mg total) by mouth daily. 30 tablet 6  . aspirin EC 81 MG tablet Take 81 mg by mouth every Monday, Wednesday, and Friday.    . clonazePAM (KLONOPIN) 0.5 MG tablet Take 0.5-1 tablets (0.25-0.5 mg total)  by mouth 2 (two) times daily as needed for anxiety. 60 tablet 3  . CYANOCOBALAMIN IJ Take one (1) injection as directed every other month.    Arne Cleveland 5 MG TABS tablet TAKE ONE TABLET TWICE DAILY 60 tablet 5  . FLUoxetine (PROZAC) 20 MG capsule Take 20 mg by mouth every morning.    . furosemide (LASIX) 20 MG tablet Take 20 mg by mouth as needed for fluid or edema.    . hyoscyamine (LEVSIN SL) 0.125 MG SL tablet Place 0.125 mg under the tongue daily as needed for cramping (IBS symptoms).    . irbesartan (AVAPRO) 300 MG tablet Take 300 mg by  mouth daily.     [name] levothyroxine (SYNTHROID, LEVOTHROID) 88 MCG tablet Take 1 tablet (88 mcg total) by mouth daily before breakfast. 30 tablet 0  . nebivolol (BYSTOLIC) 5 MG tablet Take 2.5 mg by mouth at bedtime. Takes 2.5 mg daily    . nitroGLYCERIN (NITROSTAT) 0.4 MG SL tablet Place 0.4 mg under the tongue every 5 (five) minutes as needed for chest pain (3 DOSES MAX FOR CHEST PAIN).    [name] omeprazole (PRILOSEC) 20 MG capsule Take 20 mg by mouth every morning.     . Probiotic Product (PROBIOTIC FORMULA PO) Take 1 capsule by mouth every morning. For upset stomach    . Vitamin D, Ergocalciferol, (DRISDOL) 50000 UNITS CAPS Take 50,000 Units by mouth 2 (two) times a week. Takes on Tuesdays and Fridays    . guaiFENesin (MUCINEX) 600 MG 12 hr tablet Take 2 tablets (1,200 mg total) by mouth 2 (two) times daily. (Patient not taking: Reported on 09/27/2016) 20 tablet 0  . hydrocortisone (CORTEF) 20 MG tablet Take 20 mg by mouth 2 (two) times daily.     No facility-administered medications prior to visit.      Allergies:   Zanaflex [tizanidine]; Percocet [oxycodone-acetaminophen]; Sulfa antibiotics; and Sulfacetamide sodium   Social History   Social History  . Marital status: Widowed    Spouse name: N/A  . Number of children: N/A  . Years of education: N/A   Occupational History  . Retired    Social History Main Topics  . Smoking status: Passive Smoke Exposure - Never Smoker    Packs/day: 0.00    Years: 0.00  . Smokeless tobacco: Never Used  . Alcohol use No  . Drug use: No  . Sexual activity: No   Other Topics Concern  . None   Social History Narrative  . None     Family History:  The patient's family history includes Heart attack in her father; Hypertension in her maternal grandfather and mother; Stroke in her mother.   ROS:   Please see the history of present illness.    Switched from her path to [facility] 150 mg/cc twice per month.  All other systems reviewed and are  negative.   PHYSICAL EXAM:   VS:  BP 120/62 (BP Location: Left Arm)   Pulse 99   Ht 5' 4.5" (1.638 m)   Wt 197 lb 12.8 oz (89.7 kg)   BMI 33.43 kg/m    GEN: Well nourished, well developed, in no acute distress  HEENT: normal  Neck: no JVD, carotid bruits, or masses Cardiac: RRR; no murmurs, rubs, or gallops,no edema  Respiratory:  clear to auscultation bilaterally, normal work of breathing GI: soft, nontender, nondistended, + BS MS: no deformity or atrophy  Skin: warm and dry, no rash Neuro:  Alert and Oriented x 3, Strength  and sensation are intact Psych: euthymic mood, full affect  Wt Readings from Last 3 Encounters:  09/27/16 197 lb 12.8 oz (89.7 kg)  08/24/16 202 lb (91.6 kg)  06/19/16 206 lb (93.4 kg)      Studies/Labs Reviewed:   EKG:  EKG  none  Recent Labs: 12/07/2015: Magnesium 1.8 03/22/2016: ALT 21; B Natriuretic Peptide 450.4 08/23/2016: BUN 16; Creatinine, Ser 0.94; Hemoglobin 13.0; Platelets 291; Potassium 4.3; Sodium 143 08/24/2016: NT-Pro BNP 528   Lipid Panel    Component Value Date/Time   CHOL 100 09/02/2014 0346   TRIG 78 09/02/2014 0346   HDL 40 (L) 09/02/2014 0346   CHOLHDL 2.5 09/02/2014 0346   VLDL 16 09/02/2014 0346   LDLCALC 44 09/02/2014 0346    Additional studies/ records that were reviewed today include:  48-hour Holter monitor 09/19/2016: Continuous atrial fibrillation with good rate control as noted below. Minimum: 61 bpm at 9:14 AM(2) mV at Mean: 77 bpm mV Maximum: 121 bpm at 9:43 PM(2) mV    ECHOCARDIOGRAM 09/20/2015:  Study Conclusions  - Left ventricle: The cavity size was normal. Wall thickness was   increased in a pattern of moderate LVH. Systolic function was   vigorous. The estimated ejection fraction was in the range of 65%   to 70%. - Mitral valve: There was mild regurgitation. - Pulmonary arteries: PA peak pressure: 35 mm Hg (S).  ASSESSMENT:    1. Coronary artery disease involving coronary bypass graft of native  heart with angina pectoris ([place])   2. Essential hypertension   3. Paroxysmal atrial fibrillation (HCC)      PLAN:  In order of problems listed above:  1. I believe coronary disease is stable. 2. Blood pressure is stable. 3. 30 day monitor results are pending. She will remove this today and we will get the data and do the final interpretation.  Plan clinical follow-up for the time being. Follow-up in 9 months.    Medication Adjustments/Labs and Tests Ordered: Current medicines are reviewed at length with the patient today.  Concerns regarding medicines are outlined above.  Medication changes, Labs and Tests ordered today are listed in the Patient Instructions below. There are no Patient Instructions on file for this visit.   Signed, Abigail Grooms, MD  09/27/2016 10:54 AM    [facility] [address], [place], [place]  [address] Phone: [phone]; Fax: [phone]

## 2016-09-27 ENCOUNTER — Encounter: Payer: Self-pay | Admitting: Interventional Cardiology

## 2016-09-27 ENCOUNTER — Ambulatory Visit (INDEPENDENT_AMBULATORY_CARE_PROVIDER_SITE_OTHER): Payer: Medicare Other | Admitting: Interventional Cardiology

## 2016-09-27 VITALS — BP 120/62 | HR 99 | Ht 64.5 in | Wt 197.8 lb

## 2016-09-27 DIAGNOSIS — I480 Paroxysmal atrial fibrillation: Secondary | ICD-10-CM

## 2016-09-27 DIAGNOSIS — I10 Essential (primary) hypertension: Secondary | ICD-10-CM

## 2016-09-27 DIAGNOSIS — I25709 Atherosclerosis of coronary artery bypass graft(s), unspecified, with unspecified angina pectoris: Secondary | ICD-10-CM | POA: Diagnosis not present

## 2016-09-27 NOTE — Patient Instructions (Signed)
Medication Instructions:  None  Labwork: None  Testing/Procedures: None  Follow-Up: Your physician wants you to follow-up in: 9 months with [name]. You will receive a reminder letter in the mail two months in advance. If you don't receive a letter, please call our office to schedule the follow-up appointment.   Any Other Special Instructions Will Be Listed Below (If Applicable).     If you need a refill on your cardiac medications before your next appointment, please call your pharmacy.

## 2016-09-30 ENCOUNTER — Other Ambulatory Visit: Payer: Self-pay | Admitting: Interventional Cardiology

## 2016-10-02 ENCOUNTER — Telehealth: Payer: Self-pay | Admitting: Interventional Cardiology

## 2016-10-02 NOTE — Telephone Encounter (Signed)
New message   Pt states that she had an event in her right arm and across her back on Friday night around 10. She took a nitroglycerin and it went away instantly. She requests a call back.

## 2016-10-02 NOTE — Telephone Encounter (Signed)
Pt states that Thursday at 10pm she began having pain in her right arm/shoulder, rt armpit, and back.  It remained throughout the night.  Pt got up Friday morning and went to [place].  Pain continued all day Friday.  At 10pm Friday night pt decided to take a Nitro and she said pain instantly relieved.  Denies chest discomfort, lightheadedness, dizziness, swelling, HA or jaw pain.  Has had no issues since.  Pt currently out of town and will be back on Sunday.  Advised I will route to [name] for review and advisement.

## 2016-10-02 NOTE — Telephone Encounter (Signed)
Continue clinical observation. Not sure what the pain was but unlikely it was cardiac.

## 2016-10-03 NOTE — Telephone Encounter (Signed)
Left message to call back  

## 2016-10-03 NOTE — Telephone Encounter (Signed)
Spoke with pt and went over recommendations per [name].  Pt verbalized understanding and was appreciative for call.

## 2016-10-03 NOTE — Telephone Encounter (Signed)
Follow Up:; ° ° °Returning your call. °

## 2016-10-23 ENCOUNTER — Other Ambulatory Visit: Payer: Self-pay | Admitting: Pulmonary Disease

## 2016-10-31 ENCOUNTER — Telehealth: Payer: Self-pay | Admitting: Pulmonary Disease

## 2016-10-31 ENCOUNTER — Other Ambulatory Visit: Payer: Self-pay | Admitting: Cardiology

## 2016-10-31 NOTE — Telephone Encounter (Signed)
Called and spoke to pt. Pt is requesting a refill on Klonopin 0.5mg .  Last filled on 1.8.2018 by [facility] for #60 with 2 refills.  Pt last seen on 12.14.2017 and advised to f/u 09/2016 but pt has no pending appt.   Dr. Lenna Gilford, please advise. Thanks.   Allergies  Allergen Reactions  . Zanaflex [Tizanidine] Other (See Comments)    HALLUCINATIONS  . Percocet [Oxycodone-Acetaminophen] Other (See Comments)    Hallucination  . Sulfa Antibiotics Itching    Vaginal itching  . Sulfacetamide Sodium Itching    Vaginal itching    Current Outpatient Prescriptions on File Prior to Visit  Medication Sig Dispense Refill  . acetaminophen (TYLENOL) 500 MG tablet Take 500 mg by mouth daily as needed for headache.    . Alirocumab (PRALUENT) 150 MG/ML SOPN Inject 1 mL into the skin twice monthly.    [name] amLODipine (NORVASC) 5 MG tablet Take 1 tablet (5 mg total) by mouth daily. 30 tablet 6  . aspirin EC 81 MG tablet Take 81 mg by mouth every Monday, Wednesday, and Friday.    . clonazePAM (KLONOPIN) 0.5 MG tablet Take 0.5-1 tablets (0.25-0.5 mg total) by mouth 2 (two) times daily as needed for anxiety. 60 tablet 3  . CYANOCOBALAMIN IJ Take one (1) injection as directed every other month.    Arne Cleveland 5 MG TABS tablet TAKE ONE TABLET TWICE DAILY 60 tablet 5  . FLUoxetine (PROZAC) 20 MG capsule Take 20 mg by mouth every morning.    . furosemide (LASIX) 20 MG tablet Take 20 mg by mouth as needed for fluid or edema.    . hydrocortisone (CORTEF) 20 MG tablet Take 20 mg by mouth each morning and 10 mg by mouth each evening.    . hyoscyamine (LEVSIN SL) 0.125 MG SL tablet Place 0.125 mg under the tongue daily as needed for cramping (IBS symptoms).    . irbesartan (AVAPRO) 300 MG tablet Take 300 mg by mouth daily.     [name] levothyroxine (SYNTHROID, LEVOTHROID) 88 MCG tablet Take 1 tablet (88 mcg total) by mouth daily before breakfast. 30 tablet 0  . nebivolol (BYSTOLIC) 5 MG tablet Take 2.5 mg by mouth at bedtime. Takes 2.5  mg daily    . nitroGLYCERIN (NITROSTAT) 0.4 MG SL tablet Place 0.4 mg under the tongue every 5 (five) minutes as needed for chest pain (3 DOSES MAX FOR CHEST PAIN).    [name] NITROSTAT 0.4 MG SL tablet ONE TABLET UNDER TONGUE AS NEEDED FOR CHEST PAIN AS DIRECTED 25 tablet 0  . omeprazole (PRILOSEC) 20 MG capsule Take 20 mg by mouth every morning.     . Probiotic Product (PROBIOTIC FORMULA PO) Take 1 capsule by mouth every morning. For upset stomach    . Vitamin D, Ergocalciferol, (DRISDOL) 50000 UNITS CAPS Take 50,000 Units by mouth 2 (two) times a week. Takes on Tuesdays and Fridays     No current facility-administered medications on file prior to visit.

## 2016-10-31 NOTE — Telephone Encounter (Signed)
Spoke with pt, aware of refill.  [facility] has already called in this rx to the pharmacy.  Nothing further needed.

## 2016-10-31 NOTE — Telephone Encounter (Signed)
Per SN---  Ok to refill per SN  For #60  1 po BID as directed with 5 refills. Thanks

## 2017-03-23 ENCOUNTER — Other Ambulatory Visit: Payer: Self-pay | Admitting: Interventional Cardiology

## 2017-03-23 DIAGNOSIS — I4819 Other persistent atrial fibrillation: Secondary | ICD-10-CM

## 2017-04-04 ENCOUNTER — Telehealth: Payer: Self-pay | Admitting: Interventional Cardiology

## 2017-04-04 ENCOUNTER — Encounter: Payer: Self-pay | Admitting: Cardiology

## 2017-04-04 ENCOUNTER — Ambulatory Visit: Payer: Medicare Other | Admitting: Cardiology

## 2017-04-04 VITALS — BP 90/58 | HR 65 | Ht 64.5 in | Wt 199.0 lb

## 2017-04-04 DIAGNOSIS — Z7901 Long term (current) use of anticoagulants: Secondary | ICD-10-CM | POA: Diagnosis not present

## 2017-04-04 DIAGNOSIS — I2510 Atherosclerotic heart disease of native coronary artery without angina pectoris: Secondary | ICD-10-CM | POA: Diagnosis not present

## 2017-04-04 DIAGNOSIS — I480 Paroxysmal atrial fibrillation: Secondary | ICD-10-CM

## 2017-04-04 DIAGNOSIS — R002 Palpitations: Secondary | ICD-10-CM

## 2017-04-04 DIAGNOSIS — Z951 Presence of aortocoronary bypass graft: Secondary | ICD-10-CM

## 2017-04-04 LAB — BASIC METABOLIC PANEL
BUN/Creatinine Ratio: 18 (ref 12–28)
BUN: 16 mg/dL (ref 8–27)
CALCIUM: 9.4 mg/dL (ref 8.7–10.3)
CO2: 25 mmol/L (ref 20–29)
Chloride: 105 mmol/L (ref 96–106)
Creatinine, Ser: 0.89 mg/dL (ref 0.57–1.00)
GFR calc Af Amer: 72 mL/min/{1.73_m2} (ref >59)
GFR calc non Af Amer: 63 mL/min/{1.73_m2} (ref >59)
Glucose: 130 mg/dL — ABNORMAL HIGH (ref 65–99)
POTASSIUM: 3.8 mmol/L (ref 3.5–5.2)
Sodium: 144 mmol/L (ref 134–144)

## 2017-04-04 LAB — CBC
HEMATOCRIT: 37.2 % (ref 34.0–46.6)
HEMOGLOBIN: 12.8 g/dL (ref 11.1–15.9)
MCH: 29.6 pg (ref 26.6–33.0)
MCHC: 34.4 g/dL (ref 31.5–35.7)
MCV: 86 fL (ref 79–97)
Platelets: 276 10*3/uL (ref 150–379)
RBC: 4.32 x10E6/uL (ref 3.77–5.28)
RDW: 12.8 % (ref 12.3–15.4)
WBC: 9.8 10*3/uL (ref 3.4–10.8)

## 2017-04-04 MED ORDER — DILTIAZEM HCL ER COATED BEADS 120 MG PO CP24: 120.0000 mg | ORAL_CAPSULE | Freq: Every day | ORAL | 3 refills | Status: DC

## 2017-04-04 MED ORDER — IRBESARTAN 300 MG PO TABS: 300.0000 mg | ORAL_TABLET | Freq: Every day | ORAL | 3 refills | Status: DC

## 2017-04-04 NOTE — Progress Notes (Signed)
Cardiology Office Note   Date:  04/04/2017   ID:  [name], DOB 07-Dec-1940, MRN [id number]  PCP:  Crist Infante, MD  Cardiologist:  [name]     Chief Complaint  Patient presents with  . Palpitations      History of Present Illness: [name] is a 77 y.o. female who presents for palpitations.   She has a hx coronary artery disease with prior bypass grafting, atrial fibrillation resulting in acute diastolic heart failure, hypertension, chronic anticoagulation therapy with Eliquisand hyperlipidemia.Was on amiodarone which was subsequently discontinued.  Last out pt monitor was 09/2016 with no a fib.  She did have freq PACs.   Last echo 09/2015 with moderate LDH and EF 65-70%.  Mild MR and PA pk pressure 35 mHg.   Today she had called with 5-6 weeks of increased palpitations.  Usually more in the afternoon.  She will have chest tightness when this occurs.  Then it resolves.  Also some recent DOE.  She is on Eliquis and no awareness of bleeding and she has not missed any doses.   TSH in August was normal.  She did wear a monitor in June 2018 with no a fib only PACs.  Labs were normal.    Today SR with PACs.  Her BP was low on arrival but no lightheadedness or dizziness.  She is on 5 of bystolic per day not 2.5.  Will check Orthostatics.   Past Medical History:  Diagnosis Date  . Addison's disease ([place])   . Angina   . Anxiety   . Arthritis    "everywhere"  . Atrial fibrillation ([place])   . Chronic lower back pain   . 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  last knee surgery dr. Elmyra Ricks gave large dose of hydrocortisal and avoided the hypotensive side effectas of addison's disease.  . Coronary artery disease   . Depression   . Dysrhythmia   . GERD (gastroesophageal reflux disease)   . Hyperlipemia   . Hypertension   . Hypothyroidism   . Lumbar radiculopathy 04/27/2014  . PONV (postoperative nausea and vomiting)   .  Recurrent upper respiratory infection (URI)   . Shortness of breath     Past Surgical History:  Procedure Laterality Date  . ACHILLES TENDON SURGERY  07/16/14   left ankle, bone spur removed  . ANTERIOR LUMBAR FUSION  1980   L4-5  . BACK SURGERY    . CARDIAC CATHETERIZATION  2013  . CARDIAC CATHETERIZATION N/A 09/02/2014   Procedure: Left Heart Cath and Coronary Angiography;  Surgeon: Wellington Hampshire, MD;  Location: [facility];  Service: Cardiovascular;  Laterality: N/A;  . CARDIOVERSION N/A 10/22/2015   Procedure: CARDIOVERSION;  Surgeon: Sanda Klein, MD;  [facility];  Service: Cardiovascular;  Laterality: N/A;  . CARPAL TUNNEL RELEASE Right   . CATARACT EXTRACTION, BILATERAL Bilateral   . CORONARY ARTERY BYPASS GRAFT  06/26/2011   Procedure: CORONARY ARTERY BYPASS GRAFTING (CABG);  Surgeon: Melrose Nakayama, MD;  Location: [facility];  Service: Open Heart Surgery;  Laterality: N/A;  times using Greater Saphenous Vein Graft harvested endoscopically from right leg  . DIAGNOSTIC LAPAROSCOPY    . DILATION AND CURETTAGE OF UTERUS    . JOINT REPLACEMENT    . KNEE ARTHROSCOPY Bilateral   . LAPAROSCOPIC CHOLECYSTECTOMY    . Nerve stimulator of back  12/10/15  . REVISION TOTAL KNEE ARTHROPLASTY Right   .  TONSILLECTOMY    . TOTAL KNEE ARTHROPLASTY Bilateral      Current Outpatient Medications  Medication Sig Dispense Refill  . acetaminophen (TYLENOL) 500 MG tablet Take 500 mg by mouth daily as needed for headache.    [name] amLODipine (NORVASC) 5 MG tablet TAKE ONE TABLET BY MOUTH ONCE DAILY 30 tablet 10  . aspirin EC 81 MG tablet Take 81 mg by mouth every Monday, Wednesday, and Friday.    . clonazePAM (KLONOPIN) 0.5 MG tablet TAKE 1/2 TO 1 TABLET TWICE DAILY AS NEEDED 60 tablet 5  . CYANOCOBALAMIN IJ Take one (1) injection as directed every other month.    Arne Cleveland 5 MG TABS tablet TAKE ONE TABLET TWICE DAILY 60 tablet 5  . FLUoxetine (PROZAC) 20 MG capsule Take 20 mg by  mouth every morning.    . furosemide (LASIX) 20 MG tablet Take 20 mg by mouth as needed for fluid or edema.    . hydrocortisone (CORTEF) 20 MG tablet Take 20 mg by mouth each morning and 10 mg by mouth each evening.    . hyoscyamine (LEVSIN SL) 0.125 MG SL tablet Place 0.125 mg under the tongue daily as needed for cramping (IBS symptoms).    . irbesartan (AVAPRO) 300 MG tablet Take 300 mg by mouth daily.     [name] levothyroxine (SYNTHROID, LEVOTHROID) 88 MCG tablet Take 1 tablet (88 mcg total) by mouth daily before breakfast. 30 tablet 0  . nebivolol (BYSTOLIC) 5 MG tablet Take 2.5 mg by mouth at bedtime. Takes 2.5 mg daily    . nitroGLYCERIN (NITROSTAT) 0.4 MG SL tablet Place 0.4 mg under the tongue every 5 (five) minutes as needed for chest pain (3 DOSES MAX FOR CHEST PAIN).    [name] omeprazole (PRILOSEC) 20 MG capsule Take 20 mg by mouth every morning.     . Probiotic Product (PROBIOTIC FORMULA PO) Take 1 capsule by mouth every morning. For upset stomach    . Vitamin D, Ergocalciferol, (DRISDOL) 50000 UNITS CAPS Take 50,000 Units by mouth 2 (two) times a week. Takes on Tuesdays and Fridays     No current facility-administered medications for this visit.     Allergies:   Zanaflex [tizanidine]; Percocet [oxycodone-acetaminophen]; Sulfa antibiotics; and Sulfacetamide sodium    Social History:  The patient  reports that she is a non-smoker but has been exposed to tobacco smoke. She has been exposed to 0.00 packs per day for the past 0.00 years. she has never used smokeless tobacco. She reports that she does not drink alcohol or use drugs.   Family History:  The patient's family history includes Heart attack in her father; Hypertension in her maternal grandfather and mother; Stroke in her mother.    ROS:  General:no colds or fevers, no weight changes Skin:no rashes or ulcers HEENT:no blurred vision, no congestion CV:see HPI PUL:see HPI GI:no diarrhea constipation or melena, no indigestion GU:no  hematuria, no dysuria MS:no joint pain, no claudication Neuro:no syncope, no lightheadedness Endo:no diabetes, + thyroid disease  Wt Readings from Last 3 Encounters:  04/04/17 199 lb (90.3 kg)  09/27/16 197 lb 12.8 oz (89.7 kg)  08/24/16 202 lb (91.6 kg)     PHYSICAL EXAM: VS:  BP (!) 90/58   Pulse 65   Ht 5' 4.5" (1.638 m)   Wt 199 lb (90.3 kg)   SpO2 93%   BMI 33.63 kg/m  , BMI Body mass index is 33.63 kg/m. General:Pleasant affect, NAD Skin:Warm and dry, brisk capillary refill HEENT:normocephalic,  sclera clear, mucus membranes moist Neck:supple, no JVD, no bruits  Heart:S1S2 RRR without murmur, gallup, rub or click Lungs:clear without rales, rhonchi, or wheezes PPI:RJJO, non tender, + BS, do not palpate liver spleen or masses Ext:no lower ext edema, 2+ pedal pulses, 2+ radial pulses Neuro:alert and oriented X 3, MAE, follows commands, + facial symmetry    EKG:  EKG is ordered today. The ekg ordered today demonstrates SR with PACs no acute changes.     Recent Labs: 08/23/2016: BUN 16; Creatinine, Ser 0.94; Hemoglobin 13.0; Platelets 291; Potassium 4.3; Sodium 143 08/24/2016: NT-Pro BNP 528    Lipid Panel    Component Value Date/Time   CHOL 100 09/02/2014 0346   TRIG 78 09/02/2014 0346   HDL 40 (L) 09/02/2014 0346   CHOLHDL 2.5 09/02/2014 0346   VLDL 16 09/02/2014 0346   LDLCALC 44 09/02/2014 0346       Other studies Reviewed: Additional studies/ records that were reviewed today include: . 30 day event monitor Study Highlights     Normal sinus rhythm  Frequent PAC's with episodes of atrial bigeminy  No Atrial Fibrillation   Normal study Notable for PAC's and occasional atrial bigeminy    ECHO 2017 Study Conclusions  - Left ventricle: The cavity size was normal. Wall thickness was   increased in a pattern of moderate LVH. Systolic function was   vigorous. The estimated ejection fraction was in the range of 65%   to 70%. - Mitral valve:  There was mild regurgitation. - Pulmonary arteries: PA peak pressure: 35 mm Hg (S).  ASSESSMENT AND PLAN:  1.  Palpitations - on EKG PACs alone,  For now will change amlodipine to cardiazem CD 120 and continue Bystolic at current dose.  If BP is low would decrease the avapro.  If palpitations increase will do exercise stress test to see if we can eval for a fib.  We discussed Linq to eval for palpations but most reassuring she is on eliquis. She will follow up in 2-3 weeks. Will check BMP and CBC as well today.  2.  Borderline hypotension, but with orthostatic check BP with standing 841 systolic.  She will check BP at home and call if running lower, or if she has symptoms of hypotension.  Then decrease avapro.  3.  CAD with CABG , only chest pressure is with rapid HR.     Current medicines are reviewed with the patient today.  The patient Has no concerns regarding medicines.  The following changes have been made:  See above Labs/ tests ordered today include:see above  Disposition:   FU:  see above  Signed, [name], NP  04/04/2017 11:17 AM    [facility], [place], [place], [place] Phone: [phone]; Fax: [phone]

## 2017-04-04 NOTE — Patient Instructions (Addendum)
Medication Instructions:  1. STOP NORVASC  2. START CARDIZEM CD 120 MG DAILY; RX HAS BEEN SENT IN  3. CONTINUE ON AVAPRO 300 MG DAILY  Labwork: TODAY BMET, CBC  Testing/Procedures: NONE ORDERED TODAY  Follow-Up: [name], NP ON 04/20/17 @ 9:30 AM   Any Other Special Instructions Will Be Listed Below (If Applicable).     If you need a refill on your cardiac medications before your next appointment, please call your pharmacy.

## 2017-04-04 NOTE — Telephone Encounter (Signed)
[name] is calling because she is having palpatations which has been going on for about 5 weeks , feels like fluttering . Please call

## 2017-04-04 NOTE — Telephone Encounter (Signed)
Pt states she is still having what she believes to be palps. Frequent PAC's were noted on recent event monitor.  Pt states over the last week or so palps have increased and are more bothersome.  Scheduled pt to come in today at 11A to see [name], NP and get evaluated to make sure not in Afib since episodes are more frequent than before.  Pt verbalized understanding and was in agreement with this plan.

## 2017-04-19 NOTE — Progress Notes (Signed)
Cardiology Office Note   Date:  04/20/2017   ID:  [name], DOB 1941/04/03, MRN [id number]  PCP:  Crist Infante, MD  Cardiologist:  [name]     Chief Complaint  Patient presents with  . Palpitations    palpitation, HTN      History of Present Illness: [name] is a 77 y.o. female who presents for palpitations.   She has a hx coronary artery disease with prior bypass grafting, atrial fibrillation resulting in acute diastolic heart failure, hypertension, chronic anticoagulation therapy with Eliquisand hyperlipidemia.Was on amiodarone which was subsequently discontinued.  Last out pt monitor was 09/2016 with no a fib.  She did have freq PACs.   Last echo 09/2015 with moderate LDH and EF 65-70%.  Mild MR and PA pk pressure 35 mHg.   She has had 5-6 weeks of increased palpitations.  Usually more in the afternoon.  She will have chest tightness when this occurs.  Then it resolves.  Also some recent DOE.  She is on Eliquis and no awareness of bleeding and she has not missed any doses.   TSH in August was normal.  She did wear a monitor in June 2018 with no a fib only PACs.  Labs were normal.    Last visit SR with PACs.  Her BP was low on arrival but no lightheadedness or dizziness.  She is on 5 of bystolic per day not 2.5.    Palpitations - on EKG PACs alone,  For now will change amlodipine to cardiazem CD 120 and continue Bystolic at current dose.  If BP is low would decrease the avapro.  If palpitations increase will do exercise stress test to see if we can eval for a fib.  We discussed Linq to eval for palpations but most reassuring she is on eliquis. She will follow up in 2-3 weeks.   Today she has less palpitations but very fatigued.  Her bystolic is actually at 5 mg, and may be reason for fatigue.  Her home BP checks have been elevated.  162/85 but here she is normal.  The palpitations are improved. She is just really fatigued.  Her Bystolic was increased to 5 mg  prior to her last visit with me - this may be causing fatigue.  No chest pain or SOB.    Past Medical History:  Diagnosis Date  . Addison's disease ([place])   . Angina   . Anxiety   . Arthritis    "everywhere"  . Atrial fibrillation ([place])   . Chronic lower back pain   . 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  last knee surgery dr. Elmyra Ricks gave large dose of hydrocortisal and avoided the hypotensive side effectas of addison's disease.  . Coronary artery disease   . Depression   . Dysrhythmia   . GERD (gastroesophageal reflux disease)   . Hyperlipemia   . Hypertension   . Hypothyroidism   . Lumbar radiculopathy 04/27/2014  . PONV (postoperative nausea and vomiting)   . Recurrent upper respiratory infection (URI)   . Shortness of breath     Past Surgical History:  Procedure Laterality Date  . ACHILLES TENDON SURGERY  07/16/14   left ankle, bone spur removed  . ANTERIOR LUMBAR FUSION  1980   L4-5  . BACK SURGERY    . CARDIAC CATHETERIZATION  2013  . CARDIAC CATHETERIZATION N/A 09/02/2014   Procedure: Left Heart Cath and Coronary Angiography;  Surgeon: Wellington Hampshire, MD;  Location: [facility];  Service: Cardiovascular;  Laterality: N/A;  . CARDIOVERSION N/A 10/22/2015   Procedure: CARDIOVERSION;  Surgeon: Sanda Klein, MD;  Location: [facility];  Service: Cardiovascular;  Laterality: N/A;  . CARPAL TUNNEL RELEASE Right   . CATARACT EXTRACTION, BILATERAL Bilateral   . CORONARY ARTERY BYPASS GRAFT  06/26/2011   Procedure: CORONARY ARTERY BYPASS GRAFTING (CABG);  Surgeon: Melrose Nakayama, MD;  Location: [facility];  Service: Open Heart Surgery;  Laterality: N/A;  times using Greater Saphenous Vein Graft harvested endoscopically from right leg  . DIAGNOSTIC LAPAROSCOPY    . DILATION AND CURETTAGE OF UTERUS    . JOINT REPLACEMENT    . KNEE ARTHROSCOPY Bilateral   . LAPAROSCOPIC CHOLECYSTECTOMY    . Nerve stimulator of back   12/10/15  . REVISION TOTAL KNEE ARTHROPLASTY Right   . TONSILLECTOMY    . TOTAL KNEE ARTHROPLASTY Bilateral      Current Outpatient Medications  Medication Sig Dispense Refill  . acetaminophen (TYLENOL) 500 MG tablet Take 500 mg by mouth daily as needed for headache.    [name] aspirin EC 81 MG tablet Take 81 mg by mouth every Monday, Wednesday, and Friday.    . clonazePAM (KLONOPIN) 0.5 MG tablet TAKE 1/2 TO 1 TABLET TWICE DAILY AS NEEDED 60 tablet 5  . CYANOCOBALAMIN IJ Take one (1) injection as directed every other month.    Arne Cleveland 5 MG TABS tablet TAKE ONE TABLET TWICE DAILY 60 tablet 5  . FLUoxetine (PROZAC) 20 MG capsule Take 20 mg by mouth every morning.    . furosemide (LASIX) 20 MG tablet Take 20 mg by mouth as needed for fluid or edema.    . hydrocortisone (CORTEF) 20 MG tablet Take 20 mg by mouth each morning and 10 mg by mouth each evening.    . hyoscyamine (LEVSIN SL) 0.125 MG SL tablet Place 0.125 mg under the tongue daily as needed for cramping (IBS symptoms).    . irbesartan (AVAPRO) 300 MG tablet Take 1 tablet (300 mg total) by mouth daily. 90 tablet 3  . levothyroxine (SYNTHROID, LEVOTHROID) 88 MCG tablet Take 1 tablet (88 mcg total) by mouth daily before breakfast. 30 tablet 0  . nitroGLYCERIN (NITROSTAT) 0.4 MG SL tablet Place 0.4 mg under the tongue every 5 (five) minutes as needed for chest pain (3 DOSES MAX FOR CHEST PAIN).    [name] omeprazole (PRILOSEC) 20 MG capsule Take 20 mg by mouth every morning.     . Probiotic Product (PROBIOTIC FORMULA PO) Take 1 capsule by mouth every morning. For upset stomach    . Vitamin D, Ergocalciferol, (DRISDOL) 50000 UNITS CAPS Take 50,000 Units by mouth 2 (two) times a week. Takes on Tuesdays and Fridays    . diltiazem (CARDIZEM CD) 180 MG 24 hr capsule Take 1 capsule (180 mg total) by mouth daily. 90 capsule 3  . nebivolol (BYSTOLIC) 2.5 MG tablet Take 1 tablet (2.5 mg total) by mouth daily. 90 tablet 3   No current facility-administered  medications for this visit.     Allergies:   Zanaflex [tizanidine]; Percocet [oxycodone-acetaminophen]; Sulfa antibiotics; and Sulfacetamide sodium    Social History:  The patient  reports that she is a non-smoker but has been exposed to tobacco smoke. She has been exposed to 0.00 packs per day for the past 0.00 years. she has never used smokeless tobacco. She reports that she does not drink alcohol or use drugs.  Family History:  The patient's family history includes Heart attack in her father; Hypertension in her maternal grandfather and mother; Stroke in her mother.    ROS:  General:no colds or fevers, no weight changes Skin:no rashes or ulcers HEENT:no blurred vision, no congestion CV:see HPI PUL:see HPI GI:no diarrhea constipation or melena, no indigestion GU:no hematuria, no dysuria MS:no joint pain, no claudication Neuro:no syncope, no lightheadedness Endo:no diabetes, + thyroid disease  Wt Readings from Last 3 Encounters:  04/20/17 201 lb 12.8 oz (91.5 kg)  04/04/17 199 lb (90.3 kg)  09/27/16 197 lb 12.8 oz (89.7 kg)     PHYSICAL EXAM: VS:  BP 118/60   Pulse (!) 57   Resp 16   Ht 5' 4.5" (1.638 m)   Wt 201 lb 12.8 oz (91.5 kg)   SpO2 96%   BMI 34.10 kg/m  , BMI Body mass index is 34.1 kg/m. General:Pleasant affect, NAD Skin:Warm and dry, brisk capillary refill HEENT:normocephalic, sclera clear, mucus membranes moist Neck:supple, no JVD, no bruits  Heart:S1S2 RRR without murmur, gallup, rub or click Lungs:clear without rales, rhonchi, or wheezes YDX:AJOI, non tender, + BS, do not palpate liver spleen or masses Ext:no lower ext edema, 2+ pedal pulses, 2+ radial pulses Neuro:alert and oriented X 3, MAE, follows commands, + facial symmetry    EKG:  EKG is ordered today. The ekg ordered today demonstrates SB at 57 1st degree AV block at 240 ms.  No acute changes otherwise.    Recent Labs: 08/24/2016: NT-Pro BNP 528 04/04/2017: BUN 16; Creatinine, Ser 0.89;  Hemoglobin 12.8; Platelets 276; Potassium 3.8; Sodium 144    Lipid Panel    Component Value Date/Time   CHOL 100 09/02/2014 0346   TRIG 78 09/02/2014 0346   HDL 40 (L) 09/02/2014 0346   CHOLHDL 2.5 09/02/2014 0346   VLDL 16 09/02/2014 0346   LDLCALC 44 09/02/2014 0346       Other studies Reviewed: Additional studies/ records that were reviewed today include:  Echo 09/20/15 Study Conclusions  - Left ventricle: The cavity size was normal. Wall thickness was   increased in a pattern of moderate LVH. Systolic function was   vigorous. The estimated ejection fraction was in the range of 65%   to 70%. - Mitral valve: There was mild regurgitation. - Pulmonary arteries: PA peak pressure: 35 mm Hg (S).   ASSESSMENT AND PLAN:  1.  Palpitations no PACs on EKG, today.  She notes they have improved.  Her BP is up some though today is well contorlled and she is very fatigued - will decrease bystolic to 2.5 mg and increase dilt to 180 mg - she has follow up appt with [name].  She should bring her BP cuff on next visit for check with our BP--no rapid HR.  2.  CAD with CABG no further chest pressure  3.  Was borderline orthostatic on last visit but no orthostatic hypotension.  Today BP is good.  HTN.   4.  Hx PAF on Eliquis    Current medicines are reviewed with the patient today.  The patient Has no concerns regarding medicines.  The following changes have been made:  See above Labs/ tests ordered today include:see above  Disposition:   FU:  see above  Signed, [name], NP  04/20/2017 5:42 PM    [facility] [address], [place], [place] Phone: [phone]; Fax: [phone]  [phone] 

## 2017-04-20 ENCOUNTER — Ambulatory Visit: Payer: Medicare Other | Admitting: Cardiology

## 2017-04-20 ENCOUNTER — Encounter: Payer: Self-pay | Admitting: Cardiology

## 2017-04-20 VITALS — BP 118/60 | HR 57 | Resp 16 | Ht 64.5 in | Wt 201.8 lb

## 2017-04-20 DIAGNOSIS — Z7901 Long term (current) use of anticoagulants: Secondary | ICD-10-CM

## 2017-04-20 DIAGNOSIS — I491 Atrial premature depolarization: Secondary | ICD-10-CM

## 2017-04-20 DIAGNOSIS — I10 Essential (primary) hypertension: Secondary | ICD-10-CM | POA: Diagnosis not present

## 2017-04-20 DIAGNOSIS — I2510 Atherosclerotic heart disease of native coronary artery without angina pectoris: Secondary | ICD-10-CM | POA: Diagnosis not present

## 2017-04-20 DIAGNOSIS — I480 Paroxysmal atrial fibrillation: Secondary | ICD-10-CM

## 2017-04-20 MED ORDER — DILTIAZEM HCL ER COATED BEADS 180 MG PO CP24: 180.0000 mg | ORAL_CAPSULE | Freq: Every day | ORAL | 3 refills | Status: DC

## 2017-04-20 MED ORDER — NEBIVOLOL HCL 2.5 MG PO TABS: 2.5000 mg | ORAL_TABLET | Freq: Every day | ORAL | 3 refills | Status: DC

## 2017-04-20 NOTE — Patient Instructions (Addendum)
Medication Instructions:  Your physician has recommended you make the following change in your medication:  1.  DECREASE the Bystolic to 2.5 mg taking 1 tablet daily 2.  INCREASE the Cardizem to 180 mg daily  Labwork: None ordered  Testing/Procedures: None ordered  Follow-Up: Your physician recommends that you schedule a follow-up appointment in: 06/06/17 WITH [name].  BRING YOUR BLOOD PRESSURE CUFF WITH YOU TO THIS APPT SO WE CAN CHECK IT WITH OURS   Any Other Special Instructions Will Be Listed Below (If Applicable).     If you need a refill on your cardiac medications before your next appointment, please call your pharmacy.

## 2017-06-05 NOTE — Progress Notes (Signed)
Cardiology Office Note    Date:  06/06/2017   ID:  Abigail, Scott May 14, 1940, MRN [id number]  PCP:  Crist Infante, MD  Cardiologist: Sinclair Grooms, MD   Chief Complaint  Patient presents with  . Atrial Fibrillation  . Coronary Artery Disease    History of Present Illness:  [name] is a 77 y.o. female  follow-up of coronary artery disease with prior bypass grafting, atrial fibrillation resulting in acute diastolic heart failure, hypertension, chronic anticoagulation therapy with Eliquis and hyperlipidemia. Was on amiodarone which was subsequently discontinued.  She feels weak and tired all the time.  No energy.  Denies orthopnea.  No lower lower extremity swelling.  No chest discomfort.  Came in by wheelchair because she could not walk.  Bilateral jaw discomfort that she feels is related to a prosthesis.  EKG today demonstrates atrial fibrillation with controlled ventricular response.  This is new compared to most recent tracing from January 18.   Past Medical History:  Diagnosis Date  . Addison's disease ([place])   . Angina   . Anxiety   . Arthritis    "everywhere"  . Atrial fibrillation ([place])   . Chronic lower back pain   . 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  last knee surgery dr. Elmyra Ricks gave large dose of hydrocortisal and avoided the hypotensive side effectas of addison's disease.  . Coronary artery disease   . Depression   . Dysrhythmia   . GERD (gastroesophageal reflux disease)   . Hyperlipemia   . Hypertension   . Hypothyroidism   . Lumbar radiculopathy 04/27/2014  . PONV (postoperative nausea and vomiting)   . Recurrent upper respiratory infection (URI)   . Shortness of breath     Past Surgical History:  Procedure Laterality Date  . ACHILLES TENDON SURGERY  07/16/14   left ankle, bone spur removed  . ANTERIOR LUMBAR FUSION  1980   L4-5  . BACK SURGERY    . CARDIAC CATHETERIZATION  2013  .  CARDIAC CATHETERIZATION N/A 09/02/2014   Procedure: Left Heart Cath and Coronary Angiography;  Surgeon: Wellington Hampshire, MD;  Location: [facility];  Service: Cardiovascular;  Laterality: N/A;  . CARDIOVERSION N/A 10/22/2015   Procedure: CARDIOVERSION;  Surgeon: Sanda Klein, MD;  Location[facility];  Service: Cardiovascular;  Laterality: N/A;  . CARPAL TUNNEL RELEASE Right   . CATARACT EXTRACTION, BILATERAL Bilateral   . CORONARY ARTERY BYPASS GRAFT  06/26/2011   Procedure: CORONARY ARTERY BYPASS GRAFTING (CABG);  Surgeon: Melrose Nakayama, MD;  Location: [facility];  Service: Open Heart Surgery;  Laterality: N/A;  times using Greater Saphenous Vein Graft harvested endoscopically from right leg  . DIAGNOSTIC LAPAROSCOPY    . DILATION AND CURETTAGE OF UTERUS    . JOINT REPLACEMENT    . KNEE ARTHROSCOPY Bilateral   . LAPAROSCOPIC CHOLECYSTECTOMY    . Nerve stimulator of back  12/10/15  . REVISION TOTAL KNEE ARTHROPLASTY Right   . TONSILLECTOMY    . TOTAL KNEE ARTHROPLASTY Bilateral     Current Medications: Outpatient Medications Prior to Visit  Medication Sig Dispense Refill  . acetaminophen (TYLENOL) 500 MG tablet Take 500 mg by mouth daily as needed for headache.    [name] aspirin EC 81 MG tablet Take 81 mg by mouth every Monday, Wednesday, and Friday.    . clonazePAM (KLONOPIN) 0.5 MG tablet TAKE 1/2 TO 1 TABLET TWICE DAILY AS  NEEDED 60 tablet 5  . CYANOCOBALAMIN IJ Take one (1) injection as directed every other month.    . diltiazem (CARDIZEM CD) 180 MG 24 hr capsule Take 1 capsule (180 mg total) by mouth daily. 90 capsule 3  . ELIQUIS 5 MG TABS tablet TAKE ONE TABLET TWICE DAILY 60 tablet 5  . FLUoxetine (PROZAC) 20 MG capsule Take 20 mg by mouth every morning.    . furosemide (LASIX) 20 MG tablet Take 20 mg by mouth as needed for fluid or edema.    . hydrocortisone (CORTEF) 20 MG tablet Take 20 mg by mouth each morning and 10 mg by mouth each evening.    . hyoscyamine (LEVSIN  SL) 0.125 MG SL tablet Place 0.125 mg under the tongue daily as needed for cramping (IBS symptoms).    . irbesartan (AVAPRO) 300 MG tablet Take 1 tablet (300 mg total) by mouth daily. 90 tablet 3  . levothyroxine (SYNTHROID, LEVOTHROID) 88 MCG tablet Take 1 tablet (88 mcg total) by mouth daily before breakfast. 30 tablet 0  . nebivolol (BYSTOLIC) 2.5 MG tablet Take 1 tablet (2.5 mg total) by mouth daily. 90 tablet 3  . nitroGLYCERIN (NITROSTAT) 0.4 MG SL tablet Place 0.4 mg under the tongue every 5 (five) minutes as needed for chest pain (3 DOSES MAX FOR CHEST PAIN).    [name] omeprazole (PRILOSEC) 20 MG capsule Take 20 mg by mouth every morning.     . Probiotic Product (PROBIOTIC FORMULA PO) Take 1 capsule by mouth every morning. For upset stomach    . Vitamin D, Ergocalciferol, (DRISDOL) 50000 UNITS CAPS Take 50,000 Units by mouth 2 (two) times a week. Takes on Tuesdays and Fridays     No facility-administered medications prior to visit.      Allergies:   Zanaflex [tizanidine]; Percocet [oxycodone-acetaminophen]; Sulfa antibiotics; and Sulfacetamide sodium   Social History   Socioeconomic History  . Marital status: Widowed    Spouse name: None  . Number of children: None  . Years of education: None  . Highest education level: None  Social Needs  . Financial resource strain: None  . Food insecurity - worry: None  . Food insecurity - inability: None  . Transportation needs - medical: None  . Transportation needs - non-medical: None  Occupational History  . Occupation: Retired  Tobacco Use  . Smoking status: Passive Smoke Exposure - Never Smoker  . Smokeless tobacco: Never Used  Substance and Sexual Activity  . Alcohol use: No    Alcohol/week: 0.0 oz  . Drug use: No  . Sexual activity: No    Birth control/protection: Post-menopausal  Other Topics Concern  . None  Social History Narrative  . None     Family History:  The patient's family history includes Heart attack in her  father; Hypertension in her maternal grandfather and mother; Stroke in her mother.   ROS:   Please see the history of present illness.    Does not feel well.  3 months. All other systems reviewed and are negative.   PHYSICAL EXAM:   VS:  BP 102/68   Pulse 80   Ht 5' 4.5" (1.638 m)   Wt 202 lb 6.4 oz (91.8 kg)   BMI 34.21 kg/m     Blood pressure sitting 84/50 mmHg and heart rate regular at 74.  Standing blood pressure 92/42 mmHg heart rate unchanged.  Blood pressure recheck lying 118/70 mmHg, sitting blood pressure 110/60 mmHg, and standing 105/60 mmHg.  GEN: Well  nourished, well developed, in no acute distress  HEENT: normal  Neck: no JVD, carotid bruits, or masses Cardiac: IIRR; no murmurs, rubs, or gallops,no edema  Respiratory:  clear to auscultation bilaterally, normal work of breathing GI: soft, nontender, nondistended, + BS MS: no deformity or atrophy  Skin: warm and dry, no rash Neuro:  Alert and Oriented x 3, Strength and sensation are intact Psych: euthymic mood, full affect  Wt Readings from Last 3 Encounters:  06/06/17 202 lb 6.4 oz (91.8 kg)  04/20/17 201 lb 12.8 oz (91.5 kg)  04/04/17 199 lb (90.3 kg)      Studies/Labs Reviewed:   EKG:  EKG atrial fibrillation with ventricular rate 71 bpm, interventricular conduction delay, and incomplete right bundle branch block.  A. fib is new compared to April 20, 2017  Recent Labs: 08/24/2016: NT-Pro BNP 528 04/04/2017: BUN 16; Creatinine, Ser 0.89; Hemoglobin 12.8; Platelets 276; Potassium 3.8; Sodium 144   Lipid Panel    Component Value Date/Time   CHOL 100 09/02/2014 0346   TRIG 78 09/02/2014 0346   HDL 40 (L) 09/02/2014 0346   CHOLHDL 2.5 09/02/2014 0346   VLDL 16 09/02/2014 0346   LDLCALC 44 09/02/2014 0346    Additional studies/ records that were reviewed today include:  Most recent 2D Doppler echocardiogram 2017: Study Conclusions   - Left ventricle: The cavity size was normal. Wall thickness was    increased in a pattern of moderate LVH. Systolic function was   vigorous. The estimated ejection fraction was in the range of 65%   to 70%. -Left atrial size normal. - Mitral valve: There was mild regurgitation. - Pulmonary arteries: PA peak pressure: 35 mm Hg (S).    ASSESSMENT:    1. Paroxysmal atrial fibrillation (HCC)   2. Dyspnea, unspecified type   3. Essential hypertension   4. Chronic anticoagulation   5. Coronary artery disease involving coronary bypass graft of native heart with angina pectoris ([place])   6. Hyperlipidemia with target LDL less than 70      PLAN:  In order of problems listed above:  1. Likely related to diastolic heart failure as she has had in the past with atrial fibrillation.  We will discontinue Bystolic for the time being.  She has not missed any doses of Eliquis and therefore we will plan for electrical cardioversion within the next 24 hours.  She should decrease physical activity until cardioversion has been completed.  She will need to have someone carry her to the hospital tomorrow.  She lives alone.  Will need to be started on an antiarrhythmic agent to help prevent recurrent atrial fibrillation since she become so symptomatic when present.  Will refer to A. fib clinic/EP before consideration of drug therapy.  We have tried amiodarone in the past but she had side effects (neither 1 of [place] can remember what those were).  Perhaps sotalol would be a good agent for her. 2. Has developed diastolic heart failure in the past when in atrial fibrillation.  Likely contributing to dyspnea at this time. 3. Relatively low blood pressure.  Discontinue Bystolic for the time being. 4. Continue apixaban.  She has missed no doses.  She is on 81 mg of aspirin 3 times per week.  Debatable whether or not the aspirin should be continued. 5. Asymptomatic coronary disease status post relatively recent CABG. 6. Not discussed  Symptomatic recurrent atrial fibrillation.  Not been  feeling well for at least 2 weeks.  Suspect she has been  in atrial fibrillation that length of time.  Last EKG in the office did not demonstrate atrial fib.  This was done in 6-8 weeks ago.  Multiple monitors of done looking for recurrent atrial fibrillation but it has not been identified.  She does have normal left atrial size by the most recent echo from 2017.  May need to consider for ablation or the very least medication therapy to control rhythm since AF is very symptomatic.  I will plan to see her in 2 months after she has been cardioverted, and seen EP for definitive management recommendations.  Prolonged office visit with greater than 50% of the time spent in counseling, coordination of care, and explanation of disease process.    Medication Adjustments/Labs and Tests Ordered: Current medicines are reviewed at length with the patient today.  Concerns regarding medicines are outlined above.  Medication changes, Labs and Tests ordered today are listed in the Patient Instructions below. There are no Patient Instructions on file for this visit.   Signed, Sinclair Grooms, MD  06/06/2017 10:33 AM    [facility] [place], [place],   [address] Phone: [phone]; Fax: [phone]

## 2017-06-05 NOTE — H&P (View-Only) (Signed)
Cardiology Office Note    Date:  06/06/2017   ID:  [name], [name] 21-Jan-1941, MRN [id number]  PCP:  Crist Infante, MD  Cardiologist: Sinclair Grooms, MD   Chief Complaint  Patient presents with  . Atrial Fibrillation  . Coronary Artery Disease    History of Present Illness:  [name] is a 77 y.o. female  follow-up of coronary artery disease with prior bypass grafting, atrial fibrillation resulting in acute diastolic heart failure, hypertension, chronic anticoagulation therapy with Eliquis and hyperlipidemia. Was on amiodarone which was subsequently discontinued.  She feels weak and tired all the time.  No energy.  Denies orthopnea.  No lower lower extremity swelling.  No chest discomfort.  Came in by wheelchair because she could not walk.  Bilateral jaw discomfort that she feels is related to a prosthesis.  EKG today demonstrates atrial fibrillation with controlled ventricular response.  This is new compared to most recent tracing from January 18.   Past Medical History:  Diagnosis Date  . Addison's disease ([place])   . Angina   . Anxiety   . Arthritis    "everywhere"  . Atrial fibrillation ([place])   . Chronic lower back pain   . 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  last knee surgery dr. Elmyra Ricks gave large dose of hydrocortisal and avoided the hypotensive side effectas of addison's disease.  . Coronary artery disease   . Depression   . Dysrhythmia   . GERD (gastroesophageal reflux disease)   . Hyperlipemia   . Hypertension   . Hypothyroidism   . Lumbar radiculopathy 04/27/2014  . PONV (postoperative nausea and vomiting)   . Recurrent upper respiratory infection (URI)   . Shortness of breath     Past Surgical History:  Procedure Laterality Date  . ACHILLES TENDON SURGERY  07/16/14   left ankle, bone spur removed  . ANTERIOR LUMBAR FUSION  1980   L4-5  . BACK SURGERY    . CARDIAC CATHETERIZATION  2013  .  CARDIAC CATHETERIZATION N/A 09/02/2014   Procedure: Left Heart Cath and Coronary Angiography;  Surgeon: Wellington Hampshire, MD;  Location: [facility];  Service: Cardiovascular;  Laterality: N/A;  . CARDIOVERSION N/A 10/22/2015   Procedure: CARDIOVERSION;  Surgeon: Sanda Klein, MD;  Location: [facility];  Service: Cardiovascular;  Laterality: N/A;  . CARPAL TUNNEL RELEASE Right   . CATARACT EXTRACTION, BILATERAL Bilateral   . CORONARY ARTERY BYPASS GRAFT  06/26/2011   Procedure: CORONARY ARTERY BYPASS GRAFTING (CABG);  Surgeon: Melrose Nakayama, MD;  Location: [facility];  Service: Open Heart Surgery;  Laterality: N/A;  times using Greater Saphenous Vein Graft harvested endoscopically from right leg  . DIAGNOSTIC LAPAROSCOPY    . DILATION AND CURETTAGE OF UTERUS    . JOINT REPLACEMENT    . KNEE ARTHROSCOPY Bilateral   . LAPAROSCOPIC CHOLECYSTECTOMY    . Nerve stimulator of back  12/10/15  . REVISION TOTAL KNEE ARTHROPLASTY Right   . TONSILLECTOMY    . TOTAL KNEE ARTHROPLASTY Bilateral     Current Medications: Outpatient Medications Prior to Visit  Medication Sig Dispense Refill  . acetaminophen (TYLENOL) 500 MG tablet Take 500 mg by mouth daily as needed for headache.    [name] aspirin EC 81 MG tablet Take 81 mg by mouth every Monday, Wednesday, and Friday.    . clonazePAM (KLONOPIN) 0.5 MG tablet TAKE 1/2 TO 1 TABLET TWICE DAILY AS  NEEDED 60 tablet 5  . CYANOCOBALAMIN IJ Take one (1) injection as directed every other month.    . diltiazem (CARDIZEM CD) 180 MG 24 hr capsule Take 1 capsule (180 mg total) by mouth daily. 90 capsule 3  . ELIQUIS 5 MG TABS tablet TAKE ONE TABLET TWICE DAILY 60 tablet 5  . FLUoxetine (PROZAC) 20 MG capsule Take 20 mg by mouth every morning.    . furosemide (LASIX) 20 MG tablet Take 20 mg by mouth as needed for fluid or edema.    . hydrocortisone (CORTEF) 20 MG tablet Take 20 mg by mouth each morning and 10 mg by mouth each evening.    . hyoscyamine (LEVSIN  SL) 0.125 MG SL tablet Place 0.125 mg under the tongue daily as needed for cramping (IBS symptoms).    . irbesartan (AVAPRO) 300 MG tablet Take 1 tablet (300 mg total) by mouth daily. 90 tablet 3  . levothyroxine (SYNTHROID, LEVOTHROID) 88 MCG tablet Take 1 tablet (88 mcg total) by mouth daily before breakfast. 30 tablet 0  . nebivolol (BYSTOLIC) 2.5 MG tablet Take 1 tablet (2.5 mg total) by mouth daily. 90 tablet 3  . nitroGLYCERIN (NITROSTAT) 0.4 MG SL tablet Place 0.4 mg under the tongue every 5 (five) minutes as needed for chest pain (3 DOSES MAX FOR CHEST PAIN).    [name] omeprazole (PRILOSEC) 20 MG capsule Take 20 mg by mouth every morning.     . Probiotic Product (PROBIOTIC FORMULA PO) Take 1 capsule by mouth every morning. For upset stomach    . Vitamin D, Ergocalciferol, (DRISDOL) 50000 UNITS CAPS Take 50,000 Units by mouth 2 (two) times a week. Takes on Tuesdays and Fridays     No facility-administered medications prior to visit.      Allergies:   Zanaflex [tizanidine]; Percocet [oxycodone-acetaminophen]; Sulfa antibiotics; and Sulfacetamide sodium   Social History   Socioeconomic History  . Marital status: Widowed    Spouse name: None  . Number of children: None  . Years of education: None  . Highest education level: None  Social Needs  . Financial resource strain: None  . Food insecurity - worry: None  . Food insecurity - inability: None  . Transportation needs - medical: None  . Transportation needs - non-medical: None  Occupational History  . Occupation: Retired  Tobacco Use  . Smoking status: Passive Smoke Exposure - Never Smoker  . Smokeless tobacco: Never Used  Substance and Sexual Activity  . Alcohol use: No    Alcohol/week: 0.0 oz  . Drug use: No  . Sexual activity: No    Birth control/protection: Post-menopausal  Other Topics Concern  . None  Social History Narrative  . None     Family History:  The patient's family history includes Heart attack in her  father; Hypertension in her maternal grandfather and mother; Stroke in her mother.   ROS:   Please see the history of present illness.    Does not feel well.  3 months. All other systems reviewed and are negative.   PHYSICAL EXAM:   VS:  BP 102/68   Pulse 80   Ht 5' 4.5" (1.638 m)   Wt 202 lb 6.4 oz (91.8 kg)   BMI 34.21 kg/m     Blood pressure sitting 84/50 mmHg and heart rate regular at 74.  Standing blood pressure 92/42 mmHg heart rate unchanged.  Blood pressure recheck lying 118/70 mmHg, sitting blood pressure 110/60 mmHg, and standing 105/60 mmHg.  GEN: Well  nourished, well developed, in no acute distress  HEENT: normal  Neck: no JVD, carotid bruits, or masses Cardiac: IIRR; no murmurs, rubs, or gallops,no edema  Respiratory:  clear to auscultation bilaterally, normal work of breathing GI: soft, nontender, nondistended, + BS MS: no deformity or atrophy  Skin: warm and dry, no rash Neuro:  Alert and Oriented x 3, Strength and sensation are intact Psych: euthymic mood, full affect  Wt Readings from Last 3 Encounters:  06/06/17 202 lb 6.4 oz (91.8 kg)  04/20/17 201 lb 12.8 oz (91.5 kg)  04/04/17 199 lb (90.3 kg)      Studies/Labs Reviewed:   EKG:  EKG atrial fibrillation with ventricular rate 71 bpm, interventricular conduction delay, and incomplete right bundle branch block.  A. fib is new compared to April 20, 2017  Recent Labs: 08/24/2016: NT-Pro BNP 528 04/04/2017: BUN 16; Creatinine, Ser 0.89; Hemoglobin 12.8; Platelets 276; Potassium 3.8; Sodium 144   Lipid Panel    Component Value Date/Time   CHOL 100 09/02/2014 0346   TRIG 78 09/02/2014 0346   HDL 40 (L) 09/02/2014 0346   CHOLHDL 2.5 09/02/2014 0346   VLDL 16 09/02/2014 0346   LDLCALC 44 09/02/2014 0346    Additional studies/ records that were reviewed today include:  Most recent 2D Doppler echocardiogram 2017: Study Conclusions   - Left ventricle: The cavity size was normal. Wall thickness was    increased in a pattern of moderate LVH. Systolic function was   vigorous. The estimated ejection fraction was in the range of 65%   to 70%. -Left atrial size normal. - Mitral valve: There was mild regurgitation. - Pulmonary arteries: PA peak pressure: 35 mm Hg (S).    ASSESSMENT:    1. Paroxysmal atrial fibrillation (HCC)   2. Dyspnea, unspecified type   3. Essential hypertension   4. Chronic anticoagulation   5. Coronary artery disease involving coronary bypass graft of native heart with angina pectoris ([facility])   6. Hyperlipidemia with target LDL less than 70      PLAN:  In order of problems listed above:  1. Likely related to diastolic heart failure as she has had in the past with atrial fibrillation.  We will discontinue Bystolic for the time being.  She has not missed any doses of Eliquis and therefore we will plan for electrical cardioversion within the next 24 hours.  She should decrease physical activity until cardioversion has been completed.  She will need to have someone carry her to the hospital tomorrow.  She lives alone.  Will need to be started on an antiarrhythmic agent to help prevent recurrent atrial fibrillation since she become so symptomatic when present.  Will refer to A. fib clinic/EP before consideration of drug therapy.  We have tried amiodarone in the past but she had side effects (neither 1 of [place] can remember what those were).  Perhaps sotalol would be a good agent for her. 2. Has developed diastolic heart failure in the past when in atrial fibrillation.  Likely contributing to dyspnea at this time. 3. Relatively low blood pressure.  Discontinue Bystolic for the time being. 4. Continue apixaban.  She has missed no doses.  She is on 81 mg of aspirin 3 times per week.  Debatable whether or not the aspirin should be continued. 5. Asymptomatic coronary disease status post relatively recent CABG. 6. Not discussed  Symptomatic recurrent atrial fibrillation.  Not been  feeling well for at least 2 weeks.  Suspect she has been  in atrial fibrillation that length of time.  Last EKG in the office did not demonstrate atrial fib.  This was done in 6-8 weeks ago.  Multiple monitors of done looking for recurrent atrial fibrillation but it has not been identified.  She does have normal left atrial size by the most recent echo from 2017.  May need to consider for ablation or the very least medication therapy to control rhythm since AF is very symptomatic.  I will plan to see her in 2 months after she has been cardioverted, and seen EP for definitive management recommendations.  Prolonged office visit with greater than 50% of the time spent in counseling, coordination of care, and explanation of disease process.    Medication Adjustments/Labs and Tests Ordered: Current medicines are reviewed at length with the patient today.  Concerns regarding medicines are outlined above.  Medication changes, Labs and Tests ordered today are listed in the Patient Instructions below. There are no Patient Instructions on file for this visit.   Signed, Sinclair Grooms, MD  06/06/2017 10:33 AM    [facility] [place], [place], [place]  [address] Phone: [phone]; Fax: [phone]

## 2017-06-06 ENCOUNTER — Encounter: Payer: Self-pay | Admitting: *Deleted

## 2017-06-06 ENCOUNTER — Ambulatory Visit: Payer: Medicare Other | Admitting: Interventional Cardiology

## 2017-06-06 ENCOUNTER — Encounter: Payer: Self-pay | Admitting: Interventional Cardiology

## 2017-06-06 VITALS — BP 102/68 | HR 80 | Ht 64.5 in | Wt 202.4 lb

## 2017-06-06 DIAGNOSIS — I480 Paroxysmal atrial fibrillation: Secondary | ICD-10-CM | POA: Diagnosis not present

## 2017-06-06 DIAGNOSIS — I10 Essential (primary) hypertension: Secondary | ICD-10-CM

## 2017-06-06 DIAGNOSIS — I25709 Atherosclerosis of coronary artery bypass graft(s), unspecified, with unspecified angina pectoris: Secondary | ICD-10-CM | POA: Diagnosis not present

## 2017-06-06 DIAGNOSIS — R0600 Dyspnea, unspecified: Secondary | ICD-10-CM

## 2017-06-06 DIAGNOSIS — Z7901 Long term (current) use of anticoagulants: Secondary | ICD-10-CM

## 2017-06-06 DIAGNOSIS — E785 Hyperlipidemia, unspecified: Secondary | ICD-10-CM

## 2017-06-06 NOTE — Anesthesia Preprocedure Evaluation (Addendum)
Anesthesia Evaluation  Patient identified by MRN, date of birth, ID band Patient awake    Reviewed: Allergy & Precautions, NPO status , Patient's Chart, lab work & pertinent test results  History of Anesthesia Complications (+) PONV and history of anesthetic complications  Airway Mallampati: II  TM Distance: >3 FB Neck ROM: Full    Dental  (+) Teeth Intact, Dental Advisory Given   Pulmonary sleep apnea ,    Pulmonary exam normal breath sounds clear to auscultation       Cardiovascular hypertension, Pt. on medications (-) angina+ CAD, + CABG and + DOE  + dysrhythmias Atrial Fibrillation  Rhythm:Irregular Rate:Abnormal  Echo 09/20/15: Study Conclusions  - Left ventricle: The cavity size was normal. Wall thickness was increased in a pattern of moderate LVH. Systolic function was vigorous. The estimated ejection fraction was in the range of 65% to 70%. - Mitral valve: There was mild regurgitation. - Pulmonary arteries: PA peak pressure: 35 mm Hg (S).   Neuro/Psych PSYCHIATRIC DISORDERS Anxiety Depression  Neuromuscular disease    GI/Hepatic Neg liver ROS, GERD  Medicated and Controlled,  Endo/Other  Hypothyroidism Addison's disease  Renal/GU negative Renal ROS     Musculoskeletal  (+) Arthritis ,   Abdominal   Peds  Hematology  (+) Blood dyscrasia, anemia , Obesity    Anesthesia Other Findings                       [facility]                        [address], [place] [address]                            [phone]  ------------------------------------------------------------------- Transthoracic Echocardiography  Patient:    Kanetra, Ho MR #:       [id number] Study Date: 09/20/2015 Gender:     F Age:        77 Height:     163.8 cm Weight:     90.4 kg BSA:        2.06 m^2 Pt. Status: Room:   ATTENDING    Belva Crome, MD  Livia Snellen     Belva Crome, MD  REFERRING    Belva Crome, MD  SONOGRAPHER  Victorio Palm, RDCS  PERFORMING   [facility], Outpatient  cc:  ------------------------------------------------------------------- LV EF: 65% -   70%   Reproductive/Obstetrics                           Anesthesia Physical  Anesthesia Plan  ASA: III  Anesthesia Plan: General   Post-op Pain Management:    Induction: Intravenous  PONV Risk Score and Plan: 4 or greater and Treatment may vary due to age or medical condition  Airway Management Planned: Nasal Cannula, Mask and Natural Airway  Additional Equipment:   Intra-op Plan:   Post-operative Plan:   Informed Consent: I have reviewed the patients History and Physical, chart, labs and discussed the procedure including the risks, benefits and alternatives for the proposed anesthesia with the patient or authorized representative who has indicated his/her understanding and acceptance.   Dental advisory given  Plan Discussed with: CRNA  Anesthesia Plan Comments:       Anesthesia Quick Evaluation

## 2017-06-06 NOTE — Patient Instructions (Addendum)
Medication Instructions:  1) DISCONTINUE Bystolic  Labwork: BMET, CBC and INR today  Testing/Procedures: Your physician has recommended that you have a Cardioversion (DCCV). Electrical Cardioversion uses a jolt of electricity to your heart either through paddles or wired patches attached to your chest. This is a controlled, usually prescheduled, procedure. Defibrillation is done under light anesthesia in the hospital, and you usually go home the day of the procedure. This is done to get your heart back into a normal rhythm. You are not awake for the procedure. Please see the instruction sheet given to you today.   Follow-Up: Your physician recommends that you schedule a follow-up appointment in: 5-7 days, preferably with Electrophysiology team (new pt), but can be Afib Clinic if not possible.   Your physician recommends that you schedule a follow-up appointment in: 2 months with [name].    Any Other Special Instructions Will Be Listed Below (If Applicable).     If you need a refill on your cardiac medications before your next appointment, please call your pharmacy.

## 2017-06-07 ENCOUNTER — Ambulatory Visit (HOSPITAL_COMMUNITY): Payer: Medicare Other | Admitting: Anesthesiology

## 2017-06-07 ENCOUNTER — Encounter (HOSPITAL_COMMUNITY): Payer: Self-pay | Admitting: *Deleted

## 2017-06-07 ENCOUNTER — Other Ambulatory Visit: Payer: Self-pay

## 2017-06-07 ENCOUNTER — Other Ambulatory Visit: Payer: Self-pay | Admitting: Interventional Cardiology

## 2017-06-07 ENCOUNTER — Encounter (HOSPITAL_COMMUNITY)
Admission: RE | Disposition: A | Discharge status: Home / Self Care | Payer: Self-pay | Source: Ambulatory Visit | Attending: Cardiology

## 2017-06-07 ENCOUNTER — Ambulatory Visit (HOSPITAL_COMMUNITY)
Admission: RE | Admit: 2017-06-07 | Discharge: 2017-06-07 | Disposition: A | Discharge status: Home / Self Care | Payer: Medicare Other | Source: Ambulatory Visit | Attending: Cardiology | Admitting: Cardiology

## 2017-06-07 DIAGNOSIS — I4891 Unspecified atrial fibrillation: Secondary | ICD-10-CM | POA: Diagnosis not present

## 2017-06-07 DIAGNOSIS — Z79899 Other long term (current) drug therapy: Secondary | ICD-10-CM | POA: Diagnosis not present

## 2017-06-07 DIAGNOSIS — Z7722 Contact with and (suspected) exposure to environmental tobacco smoke (acute) (chronic): Secondary | ICD-10-CM | POA: Diagnosis not present

## 2017-06-07 DIAGNOSIS — I2510 Atherosclerotic heart disease of native coronary artery without angina pectoris: Secondary | ICD-10-CM | POA: Diagnosis not present

## 2017-06-07 DIAGNOSIS — M5416 Radiculopathy, lumbar region: Secondary | ICD-10-CM | POA: Insufficient documentation

## 2017-06-07 DIAGNOSIS — F419 Anxiety disorder, unspecified: Secondary | ICD-10-CM | POA: Insufficient documentation

## 2017-06-07 DIAGNOSIS — K219 Gastro-esophageal reflux disease without esophagitis: Secondary | ICD-10-CM | POA: Insufficient documentation

## 2017-06-07 DIAGNOSIS — Z7901 Long term (current) use of anticoagulants: Secondary | ICD-10-CM | POA: Diagnosis not present

## 2017-06-07 DIAGNOSIS — I110 Hypertensive heart disease with heart failure: Secondary | ICD-10-CM | POA: Insufficient documentation

## 2017-06-07 DIAGNOSIS — E785 Hyperlipidemia, unspecified: Secondary | ICD-10-CM | POA: Diagnosis not present

## 2017-06-07 DIAGNOSIS — Z7982 Long term (current) use of aspirin: Secondary | ICD-10-CM | POA: Diagnosis not present

## 2017-06-07 DIAGNOSIS — I5032 Chronic diastolic (congestive) heart failure: Secondary | ICD-10-CM | POA: Insufficient documentation

## 2017-06-07 DIAGNOSIS — E039 Hypothyroidism, unspecified: Secondary | ICD-10-CM | POA: Diagnosis not present

## 2017-06-07 DIAGNOSIS — Z882 Allergy status to sulfonamides status: Secondary | ICD-10-CM | POA: Diagnosis not present

## 2017-06-07 DIAGNOSIS — Z885 Allergy status to narcotic agent status: Secondary | ICD-10-CM | POA: Insufficient documentation

## 2017-06-07 DIAGNOSIS — F329 Major depressive disorder, single episode, unspecified: Secondary | ICD-10-CM | POA: Diagnosis not present

## 2017-06-07 DIAGNOSIS — I480 Paroxysmal atrial fibrillation: Secondary | ICD-10-CM | POA: Diagnosis present

## 2017-06-07 HISTORY — PX: CARDIOVERSION: SHX1299

## 2017-06-07 LAB — CBC
Hematocrit: 40.9 % (ref 34.0–46.6)
Hemoglobin: 13.6 g/dL (ref 11.1–15.9)
MCH: 29 pg (ref 26.6–33.0)
MCHC: 33.3 g/dL (ref 31.5–35.7)
MCV: 87 fL (ref 79–97)
PLATELETS: 317 10*3/uL (ref 150–379)
RBC: 4.69 x10E6/uL (ref 3.77–5.28)
RDW: 13.8 % (ref 12.3–15.4)
WBC: 14.2 10*3/uL — ABNORMAL HIGH (ref 3.4–10.8)

## 2017-06-07 LAB — BASIC METABOLIC PANEL
BUN / CREAT RATIO: 20 (ref 12–28)
BUN: 18 mg/dL (ref 8–27)
CO2: 25 mmol/L (ref 20–29)
CREATININE: 0.92 mg/dL (ref 0.57–1.00)
Calcium: 9.2 mg/dL (ref 8.7–10.3)
Chloride: 101 mmol/L (ref 96–106)
GFR calc Af Amer: 69 mL/min/{1.73_m2} (ref >59)
GFR, EST NON AFRICAN AMERICAN: 60 mL/min/{1.73_m2} (ref >59)
Glucose: 128 mg/dL — ABNORMAL HIGH (ref 65–99)
Potassium: 3.7 mmol/L (ref 3.5–5.2)
SODIUM: 140 mmol/L (ref 134–144)

## 2017-06-07 LAB — PROTIME-INR
INR: 1.1 (ref 0.8–1.2)
Prothrombin Time: 11.2 s (ref 9.1–12.0)

## 2017-06-07 SURGERY — CARDIOVERSION
Anesthesia: Monitor Anesthesia Care

## 2017-06-07 MED ORDER — LIDOCAINE 2% (20 MG/ML) 5 ML SYRINGE
INTRAMUSCULAR | Status: DC | PRN
  Administered 2017-06-07: 40 mg via INTRAVENOUS

## 2017-06-07 MED ORDER — SODIUM CHLORIDE 0.9 % IV SOLN
INTRAVENOUS | Status: DC
  Administered 2017-06-07: 07:00:00 via INTRAVENOUS

## 2017-06-07 MED ORDER — PROPOFOL 10 MG/ML IV BOLUS
INTRAVENOUS | Status: DC | PRN
  Administered 2017-06-07: 70 mg via INTRAVENOUS

## 2017-06-07 NOTE — Discharge Instructions (Signed)
Electrical Cardioversion, Care After °This sheet gives you information about how to care for yourself after your procedure. Your health care provider may also give you more specific instructions. If you have problems or questions, contact your health care provider. °What can I expect after the procedure? °After the procedure, it is common to have: °· Some redness on the skin where the shocks were given. ° °Follow these instructions at home: °· Do not drive for 24 hours if you were given a medicine to help you relax (sedative). °· Take over-the-counter and prescription medicines only as told by your health care provider. °· Ask your health care provider how to check your pulse. Check it often. °· Rest for 48 hours after the procedure or as told by your health care provider. °· Avoid or limit your caffeine use as told by your health care provider. °Contact a health care provider if: °· You feel like your heart is beating too quickly or your pulse is not regular. °· You have a serious muscle cramp that does not go away. °Get help right away if: °· You have discomfort in your chest. °· You are dizzy or you feel faint. °· You have trouble breathing or you are short of breath. °· Your speech is slurred. °· You have trouble moving an arm or leg on one side of your body. °· Your fingers or toes turn cold or blue. °This information is not intended to replace advice given to you by your health care provider. Make sure you discuss any questions you have with your health care provider. °Document Released: 01/08/2013 Document Revised: 10/22/2015 Document Reviewed: 09/24/2015 °Elsevier Interactive Patient Education © 2018 [employer] ° °

## 2017-06-07 NOTE — Transfer of Care (Signed)
Immediate Anesthesia Transfer of Care Note  Patient: [name]  Procedure(s) Performed: CARDIOVERSION (N/A )  Patient Location: Endoscopy Unit  Anesthesia Type:General  Level of Consciousness: awake, alert  and oriented  Airway & Oxygen Therapy: Patient Spontanous Breathing and Patient connected to nasal cannula oxygen  Post-op Assessment: Report given to RN and Post -op Vital signs reviewed and stable  Post vital signs: Reviewed and stable  Last Vitals:  Vitals:   06/07/17 0732  BP: (!) 142/74  Resp: 20  Temp: 36.6 C  SpO2: 96%    Last Pain:  Vitals:   06/07/17 0732  TempSrc: Oral         Complications: No apparent anesthesia complications

## 2017-06-07 NOTE — Procedures (Signed)
Electrical Cardioversion Procedure Note [name] [id number] 1941-03-02  Procedure: Electrical Cardioversion Indications:  Atrial Fibrillation  Procedure Details Consent: Risks of procedure as well as the alternatives and risks of each were explained to the (patient/caregiver).  Consent for procedure obtained. Time Out: Verified patient identification, verified procedure, site/side was marked, verified correct patient position, special equipment/implants available, medications/allergies/relevent history reviewed, required imaging and test results available.  Performed  Patient placed on cardiac monitor, pulse oximetry, supplemental oxygen as necessary.  Sedation given: Per anesthesiology Pacer pads placed anterior and posterior chest.  Cardioverted 1 time(s).  Cardioverted at [place].  Evaluation Findings: Post procedure EKG shows: NSR Complications: None Patient did tolerate procedure well.   Loralie Champagne 06/07/2017, 8:37 AM

## 2017-06-07 NOTE — Interval H&P Note (Signed)
History and Physical Interval Note:  06/07/2017 8:25 AM  [name]  has presented today for surgery, with the diagnosis of AFIB  The various methods of treatment have been discussed with the patient and family. After consideration of risks, benefits and other options for treatment, the patient has consented to  Procedure(s): CARDIOVERSION (N/A) as a surgical intervention .  The patient's history has been reviewed, patient examined, no change in status, stable for surgery.  I have reviewed the patient's chart and labs.  Questions were answered to the patient's satisfaction.     [name]

## 2017-06-07 NOTE — Anesthesia Postprocedure Evaluation (Signed)
Anesthesia Post Note  Patient: [name]  Procedure(s) Performed: CARDIOVERSION (N/A )     Patient location during evaluation: PACU Anesthesia Type: General Level of consciousness: awake Pain management: pain level controlled Vital Signs Assessment: post-procedure vital signs reviewed and stable Respiratory status: spontaneous breathing Cardiovascular status: stable Postop Assessment: no apparent nausea or vomiting Anesthetic complications: no    Last Vitals:  Vitals:   06/07/17 0732 06/07/17 0835  BP: (!) 142/74 (!) 96/37  Resp: 20 (!) 24  Temp: 36.6 C 36.7 C  SpO2: 96% 96%    Last Pain:  Vitals:   06/07/17 0835  TempSrc: Oral   Pain Goal:                 Neera Teng JR,JOHN Yogi Arther

## 2017-06-11 ENCOUNTER — Encounter (HOSPITAL_COMMUNITY): Payer: Self-pay | Admitting: Nurse Practitioner

## 2017-06-11 ENCOUNTER — Other Ambulatory Visit: Payer: Self-pay

## 2017-06-11 ENCOUNTER — Ambulatory Visit (HOSPITAL_COMMUNITY)
Admission: RE | Admit: 2017-06-11 | Discharge: 2017-06-11 | Disposition: A | Discharge status: Home / Self Care | Payer: Medicare Other | Source: Ambulatory Visit | Attending: Nurse Practitioner | Admitting: Nurse Practitioner

## 2017-06-11 VITALS — BP 140/72 | HR 77 | Ht 64.5 in | Wt 205.0 lb

## 2017-06-11 DIAGNOSIS — I110 Hypertensive heart disease with heart failure: Secondary | ICD-10-CM | POA: Insufficient documentation

## 2017-06-11 DIAGNOSIS — Z7982 Long term (current) use of aspirin: Secondary | ICD-10-CM | POA: Insufficient documentation

## 2017-06-11 DIAGNOSIS — I2510 Atherosclerotic heart disease of native coronary artery without angina pectoris: Secondary | ICD-10-CM | POA: Diagnosis present

## 2017-06-11 DIAGNOSIS — Z79899 Other long term (current) drug therapy: Secondary | ICD-10-CM | POA: Insufficient documentation

## 2017-06-11 DIAGNOSIS — F419 Anxiety disorder, unspecified: Secondary | ICD-10-CM | POA: Insufficient documentation

## 2017-06-11 DIAGNOSIS — M545 Low back pain: Secondary | ICD-10-CM | POA: Insufficient documentation

## 2017-06-11 DIAGNOSIS — Z7901 Long term (current) use of anticoagulants: Secondary | ICD-10-CM | POA: Insufficient documentation

## 2017-06-11 DIAGNOSIS — E785 Hyperlipidemia, unspecified: Secondary | ICD-10-CM | POA: Insufficient documentation

## 2017-06-11 DIAGNOSIS — E271 Primary adrenocortical insufficiency: Secondary | ICD-10-CM | POA: Insufficient documentation

## 2017-06-11 DIAGNOSIS — Z7722 Contact with and (suspected) exposure to environmental tobacco smoke (acute) (chronic): Secondary | ICD-10-CM | POA: Insufficient documentation

## 2017-06-11 DIAGNOSIS — Z79890 Hormone replacement therapy (postmenopausal): Secondary | ICD-10-CM | POA: Insufficient documentation

## 2017-06-11 DIAGNOSIS — I480 Paroxysmal atrial fibrillation: Secondary | ICD-10-CM | POA: Diagnosis present

## 2017-06-11 DIAGNOSIS — G8929 Other chronic pain: Secondary | ICD-10-CM | POA: Diagnosis not present

## 2017-06-11 DIAGNOSIS — Z951 Presence of aortocoronary bypass graft: Secondary | ICD-10-CM | POA: Diagnosis not present

## 2017-06-11 DIAGNOSIS — F329 Major depressive disorder, single episode, unspecified: Secondary | ICD-10-CM | POA: Insufficient documentation

## 2017-06-11 DIAGNOSIS — E039 Hypothyroidism, unspecified: Secondary | ICD-10-CM | POA: Insufficient documentation

## 2017-06-11 DIAGNOSIS — I5031 Acute diastolic (congestive) heart failure: Secondary | ICD-10-CM | POA: Diagnosis not present

## 2017-06-11 DIAGNOSIS — K219 Gastro-esophageal reflux disease without esophagitis: Secondary | ICD-10-CM | POA: Diagnosis not present

## 2017-06-12 ENCOUNTER — Telehealth: Payer: Self-pay | Admitting: Pharmacist

## 2017-06-12 NOTE — Progress Notes (Addendum)
Primary Care Physician: Crist Infante, MD Referring Physician: Dr. Jannet Scott is a 77 y.o. female with a h/o coronary artery disease with prior bypass grafting, atrial fibrillation resulting in acute diastolic heart failure, hypertension, chronic anticoagulation therapy with Eliquisand hyperlipidemia.Was on amiodarone which was subsequently discontinued.  [name] was found to be in  afib 3/6 when seen by [name] and subsequently had successful cardioversion 3/7. [name] is now in the afib clinic to discuss antiarrythmic's. [name] continues in SR and feels improved. [name] is also on eliquis 5 mg bid and has not had any missed doses.   Today, [name] denies symptoms of palpitations, chest pain, shortness of breath, orthopnea, PND, lower extremity edema, dizziness, presyncope, syncope, or neurologic sequela. The patient is tolerating medications without difficulties and is otherwise without complaint today.   Past Medical History:  Diagnosis Date  . Addison's disease ([place])   . Angina   . Anxiety   . Arthritis    "everywhere"  . Atrial fibrillation ([place])   . Chronic lower back pain   . 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  last knee surgery dr. Elmyra Ricks gave large dose of hydrocortisal and avoided the hypotensive side effectas of addison's disease.  . Coronary artery disease   . Depression   . Dysrhythmia   . GERD (gastroesophageal reflux disease)   . Hyperlipemia   . Hypertension   . Hypothyroidism   . Lumbar radiculopathy 04/27/2014  . PONV (postoperative nausea and vomiting)   . Recurrent upper respiratory infection (URI)   . Shortness of breath    Past Surgical History:  Procedure Laterality Date  . ACHILLES TENDON SURGERY  07/16/14   left ankle, bone spur removed  . ANTERIOR LUMBAR FUSION  1980   L4-5  . BACK SURGERY    . CARDIAC CATHETERIZATION  2013  . CARDIAC CATHETERIZATION N/A 09/02/2014   Procedure: Left Heart Cath and  Coronary Angiography;  Surgeon: Wellington Hampshire, MD;  Location: [facility];  Service: Cardiovascular;  Laterality: N/A;  . CARDIOVERSION N/A 10/22/2015   Procedure: CARDIOVERSION;  Surgeon: Sanda Klein, MD;  Location: [facility];  Service: Cardiovascular;  Laterality: N/A;  . CARDIOVERSION N/A 06/07/2017   Procedure: CARDIOVERSION;  Surgeon: Larey Dresser, MD;  Location: [facility];  Service: Cardiovascular;  Laterality: N/A;  . CARPAL TUNNEL RELEASE Right   . CATARACT EXTRACTION, BILATERAL Bilateral   . CORONARY ARTERY BYPASS GRAFT  06/26/2011   Procedure: CORONARY ARTERY BYPASS GRAFTING (CABG);  Surgeon: Melrose Nakayama, MD;  Location: [facility];  Service: Open Heart Surgery;  Laterality: N/A;  times using Greater Saphenous Vein Graft harvested endoscopically from right leg  . DIAGNOSTIC LAPAROSCOPY    . DILATION AND CURETTAGE OF UTERUS    . JOINT REPLACEMENT    . KNEE ARTHROSCOPY Bilateral   . LAPAROSCOPIC CHOLECYSTECTOMY    . Nerve stimulator of back  12/10/15  . REVISION TOTAL KNEE ARTHROPLASTY Right   . TONSILLECTOMY    . TOTAL KNEE ARTHROPLASTY Bilateral     Current Outpatient Medications  Medication Sig Dispense Refill  . acetaminophen (TYLENOL) 500 MG tablet Take 500 mg by mouth daily as needed for headache.    [name] aspirin EC 81 MG tablet Take 81 mg by mouth every Monday, Wednesday, and Friday.    . clonazePAM (KLONOPIN) 0.5 MG tablet TAKE 1/2 TO 1 TABLET TWICE DAILY AS NEEDED (Patient taking differently: TAKE 1/2 TO  1 TABLET TWICE DAILY AS NEEDED FOR ANXIETY) 60 tablet 5  . CYANOCOBALAMIN IJ Take one (1) injection as directed every other month.    . diltiazem (CARDIZEM CD) 180 MG 24 hr capsule Take 1 capsule (180 mg total) by mouth daily. 90 capsule 3  . ELIQUIS 5 MG TABS tablet TAKE ONE TABLET TWICE DAILY 60 tablet 5  . FLUoxetine (PROZAC) 20 MG capsule Take 20 mg by mouth every morning.    . furosemide (LASIX) 20 MG tablet Take 20 mg by mouth as needed for fluid  or edema.    . hydrocortisone (CORTEF) 20 MG tablet Take 20 mg by mouth each morning and 10 mg by mouth each evening.    . hyoscyamine (LEVSIN SL) 0.125 MG SL tablet Place 0.125 mg under the tongue daily as needed for cramping (IBS symptoms).    [name] levothyroxine (SYNTHROID, LEVOTHROID) 88 MCG tablet Take 1 tablet (88 mcg total) by mouth daily before breakfast. 30 tablet 0  . nitroGLYCERIN (NITROSTAT) 0.4 MG SL tablet Place 0.4 mg under the tongue every 5 (five) minutes as needed for chest pain (3 DOSES MAX FOR CHEST PAIN).    [name] omeprazole (PRILOSEC) 20 MG capsule Take 20 mg by mouth every morning.     . Probiotic Product (PROBIOTIC FORMULA PO) Take 1 capsule by mouth every morning. For upset stomach    . valsartan (DIOVAN) 320 MG tablet 1 tablet daily.    . Vitamin D, Ergocalciferol, (DRISDOL) 50000 UNITS CAPS Take 50,000 Units by mouth 2 (two) times a week. Takes on Tuesdays and Fridays     No current facility-administered medications for this encounter.     Allergies  Allergen Reactions  . Zanaflex [Tizanidine] Other (See Comments)    HALLUCINATIONS  . Percocet [Oxycodone-Acetaminophen] Other (See Comments)    Hallucination  . Sulfa Antibiotics Itching    Vaginal itching  . Sulfacetamide Sodium Itching    Vaginal itching    Social History   Socioeconomic History  . Marital status: Widowed    Spouse name: Not on file  . Number of children: Not on file  . Years of education: Not on file  . Highest education level: Not on file  Social Needs  . Financial resource strain: Not on file  . Food insecurity - worry: Not on file  . Food insecurity - inability: Not on file  . Transportation needs - medical: Not on file  . Transportation needs - non-medical: Not on file  Occupational History  . Occupation: Retired  Tobacco Use  . Smoking status: Passive Smoke Exposure - Never Smoker  . Smokeless tobacco: Never Used  Substance and Sexual Activity  . Alcohol use: No    Alcohol/week:  0.0 oz  . Drug use: No  . Sexual activity: No    Birth control/protection: Post-menopausal  Other Topics Concern  . Not on file  Social History Narrative  . Not on file    Family History  Problem Relation Age of Onset  . Heart attack Father   . Hypertension Mother   . Stroke Mother   . Hypertension Maternal Grandfather   . Colon cancer Neg Hx     ROS- All systems are reviewed and negative except as per the HPI above  Physical Exam: Vitals:   06/11/17 1346  BP: 140/72  Pulse: 77  Weight: 205 lb (93 kg)  Height: 5' 4.5" (1.638 m)   Wt Readings from Last 3 Encounters:  06/11/17 205 lb (93 kg)  06/07/17 202 lb (91.6 kg)  06/06/17 202 lb 6.4 oz (91.8 kg)    Labs: Lab Results  Component Value Date   NA 140 06/06/2017   K 3.7 06/06/2017   CL 101 06/06/2017   CO2 25 06/06/2017   GLUCOSE 128 (H) 06/06/2017   BUN 18 06/06/2017   CREATININE 0.92 06/06/2017   CALCIUM 9.2 06/06/2017   MG 1.8 12/07/2015   Lab Results  Component Value Date   INR 1.1 06/06/2017   Lab Results  Component Value Date   CHOL 100 09/02/2014   HDL 40 (L) 09/02/2014   LDLCALC 44 09/02/2014   TRIG 78 09/02/2014     GEN- The patient is well appearing, alert and oriented x 3 today.   Head- normocephalic, atraumatic Eyes-  Sclera clear, conjunctiva pink Ears- hearing intact Oropharynx- clear Neck- supple, no JVP Lymph- no cervical lymphadenopathy Lungs- Clear to ausculation bilaterally, normal work of breathing Heart- Regular rate and rhythm, no murmurs, rubs or gallops, PMI not laterally displaced GI- soft, NT, ND, + BS Extremities- no clubbing, cyanosis, or edema MS- no significant deformity or atrophy Skin- no rash or lesion Psych- euthymic mood, full affect Neuro- strength and sensation are intact  EKG- NSR at 77 bpm, pr int 204 ms, qrs int 110 ms, qtc 502 ms EPIC records reviewed    Assessment and Plan: 1. Paroxysmal afib Successful cardioversion Options discussed to  maintain SR [name] suggested sotalol Pt would like to think about it and discuss with family as it will require hospital stay I will have her drugs screened by PharmD(on Prozac, pt has been on this drug for a long time and does not think it could be stopped) and discuss prolonged qtc with Dr. Rayann Heman today as it is 502 ms, previous EKG's did not show a prolonged qtc.  We will be back in touch to set up plan  Addendum: 3/13- I discussed with the pt that I did show the EKG to Dr. Rayann Heman and although the qtc did look prolonged, he did not think it looked as long as reported out. He  suggested cutting the dose of Prozac in half to help get sotalol on board by shortening up the qt interval. The pt will discuss with her PCP and let me know.  [name], [facility] [address] [place], [place] [address] [phone]

## 2017-06-12 NOTE — Telephone Encounter (Signed)
Medication list reviewed in anticipation of upcoming sotalol initiation. Patient is taking fluoxetine which is QTc prolonging but not contraindicated to use with sotalol. Will need to monitor QTc closely during sotalol initiation. Pt previously took amiodarone but last prescription is from 2017 so amiodarone level will not be needed.  Patient is anticoagulated on Eliquis 5mg  BID on the appropriate dose. Please ensure that patient has not missed any anticoagulation doses in the 3 weeks prior to sotalol initiation.

## 2017-06-15 ENCOUNTER — Ambulatory Visit (INDEPENDENT_AMBULATORY_CARE_PROVIDER_SITE_OTHER): Payer: Medicare Other | Admitting: Podiatry

## 2017-06-15 ENCOUNTER — Encounter: Payer: Self-pay | Admitting: Podiatry

## 2017-06-15 VITALS — BP 129/60 | HR 69 | Resp 16

## 2017-06-15 DIAGNOSIS — L600 Ingrowing nail: Secondary | ICD-10-CM

## 2017-06-15 DIAGNOSIS — I2510 Atherosclerotic heart disease of native coronary artery without angina pectoris: Secondary | ICD-10-CM | POA: Insufficient documentation

## 2017-06-15 DIAGNOSIS — F32A Depression, unspecified: Secondary | ICD-10-CM | POA: Insufficient documentation

## 2017-06-15 DIAGNOSIS — F329 Major depressive disorder, single episode, unspecified: Secondary | ICD-10-CM | POA: Insufficient documentation

## 2017-06-15 NOTE — Patient Instructions (Signed)

## 2017-06-18 NOTE — Progress Notes (Signed)
Subjective:   Patient ID: [name], female   DOB: 77 y.o.   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  Patient states she is tried to trim them and pad them and soak them without relief.  Patient does not smoke and likes to be active   Review of Systems  All other systems reviewed and are negative.       Objective:  Physical Exam  Constitutional: She appears well-developed and well-nourished.  Cardiovascular: Intact distal pulses.  Pulmonary/Chest: Effort normal.  Musculoskeletal: Normal range of motion.  Neurological: She is alert.  Skin: Skin is warm.  Nursing note and vitals reviewed.   Neurovascular status found to be intact muscle strength adequate range of motion within normal limits with patient found to have incurvated left hallux medial and lateral borders and right hallux on the medial border.  They are sore when pressed make shoe gear difficult and there is no indications of infection currently with no increased redness or drainage     Assessment:  Ingrown toenail deformity hallux bilateral     Plan:  H&P condition reviewed at great length.  I recommended removal of the corners explaining ultimately she may lose the entire nail especially on the left one.  She wants procedure understanding risk signed consent form and at this time I infiltrated each hallux 60 mg like Marcaine mixture sterile prep applied to each big toe and using sterile instrumentation the borders of the left hallux and the one border of the right hallux were removed the matrix was exposed and phenol 3 applications 30 seconds followed by alcohol lavage occurred.  Patient had sterile dressings applied was given instructions on soaks and to be seen back and is encouraged to call with any questions

## 2017-06-19 ENCOUNTER — Telehealth: Payer: Self-pay | Admitting: *Deleted

## 2017-06-19 NOTE — Telephone Encounter (Signed)
Pt states she had ingrowns removed from both feet on Friday by Dr. Paulla Dolly and the right foot is fine and the left 1st toe continues to ache and tylenol is not helping. Pt denies redness, increased swelling or cloudy drainage, the left 1st toenail area of the procedure does have white flesh around. Pt states both feet look like they are progressing like should but the left 1st toe hurts. I asked pt if she had any pain medications she could not take and she stated tramadol makes her hallucinate. I placed tramadol on pt's allergy list. Pt states she is on blood thinner, I told pt she could take ibuprofen or aleve if this was allowed by the doctors managing her blood thinners. Pt states she will try the aleve. I told pt if the pain, redness, swelling increased or if had a cloudy discharge to call for an appt or call with questions.

## 2017-06-29 ENCOUNTER — Ambulatory Visit (HOSPITAL_COMMUNITY)
Admission: RE | Admit: 2017-06-29 | Discharge: 2017-06-29 | Disposition: A | Discharge status: Home / Self Care | Payer: Medicare Other | Source: Ambulatory Visit | Attending: Nurse Practitioner | Admitting: Nurse Practitioner

## 2017-06-29 ENCOUNTER — Other Ambulatory Visit (HOSPITAL_COMMUNITY): Payer: Self-pay | Admitting: *Deleted

## 2017-06-29 DIAGNOSIS — I498 Other specified cardiac arrhythmias: Secondary | ICD-10-CM | POA: Insufficient documentation

## 2017-06-29 DIAGNOSIS — I480 Paroxysmal atrial fibrillation: Secondary | ICD-10-CM | POA: Insufficient documentation

## 2017-06-29 DIAGNOSIS — I491 Atrial premature depolarization: Secondary | ICD-10-CM | POA: Insufficient documentation

## 2017-06-29 LAB — BASIC METABOLIC PANEL
ANION GAP: 9 (ref 5–15)
BUN: 14 mg/dL (ref 6–20)
CHLORIDE: 103 mmol/L (ref 101–111)
CO2: 28 mmol/L (ref 22–32)
Calcium: 9.4 mg/dL (ref 8.9–10.3)
Creatinine, Ser: 0.91 mg/dL (ref 0.44–1.00)
GFR calc Af Amer: >60 mL/min (ref >60)
GFR calc non Af Amer: 59 mL/min — ABNORMAL LOW (ref >60)
GLUCOSE: 139 mg/dL — AB (ref 65–99)
POTASSIUM: 3.6 mmol/L (ref 3.5–5.1)
Sodium: 140 mmol/L (ref 135–145)

## 2017-06-29 LAB — MAGNESIUM: Magnesium: 1.9 mg/dL (ref 1.7–2.4)

## 2017-06-29 MED ORDER — POTASSIUM CHLORIDE ER 10 MEQ PO TBCR: 20.0000 meq | EXTENDED_RELEASE_TABLET | Freq: Every day | ORAL | 3 refills | Status: DC

## 2017-06-29 NOTE — Progress Notes (Addendum)
Pt in for repeat EKG and BP.  To be reviewed by Roderic Palau, NP  Pt had reduced her prozac thru PCP to shorten qtc for sotalol loading pending next Tuesday. Qtc is shorter now at 458 ms vrs 502 ms on last EKG. SHe has noted some myalgias upper Left back and rt arm that has been present 24/7, does not worsen with activity and is relieved with advil. I advised to discuss with PCP to see if this could be a side effect of lowering drug. She was also advised not to take aleve with DOAC for fear of increasing bleeding risk.

## 2017-07-03 ENCOUNTER — Other Ambulatory Visit (HOSPITAL_COMMUNITY): Payer: Self-pay | Admitting: *Deleted

## 2017-07-03 ENCOUNTER — Encounter (HOSPITAL_COMMUNITY): Payer: Self-pay | Admitting: Nurse Practitioner

## 2017-07-03 ENCOUNTER — Inpatient Hospital Stay (HOSPITAL_COMMUNITY)
Admission: RE | Admit: 2017-07-03 | Discharge: 2017-07-06 | DRG: 309 | Disposition: A | Discharge status: Home / Self Care | Payer: Medicare Other | Source: Ambulatory Visit | Attending: Internal Medicine | Admitting: Internal Medicine

## 2017-07-03 ENCOUNTER — Other Ambulatory Visit: Payer: Self-pay

## 2017-07-03 ENCOUNTER — Encounter (HOSPITAL_COMMUNITY): Payer: Self-pay

## 2017-07-03 ENCOUNTER — Ambulatory Visit (HOSPITAL_BASED_OUTPATIENT_CLINIC_OR_DEPARTMENT_OTHER)
Admission: RE | Admit: 2017-07-03 | Discharge: 2017-07-03 | Disposition: A | Discharge status: Home / Self Care | Payer: Medicare Other | Source: Ambulatory Visit | Attending: Nurse Practitioner | Admitting: Nurse Practitioner

## 2017-07-03 VITALS — BP 122/56 | HR 117 | Ht 64.5 in | Wt 198.8 lb

## 2017-07-03 DIAGNOSIS — Z7901 Long term (current) use of anticoagulants: Secondary | ICD-10-CM | POA: Diagnosis not present

## 2017-07-03 DIAGNOSIS — Z96653 Presence of artificial knee joint, bilateral: Secondary | ICD-10-CM | POA: Diagnosis present

## 2017-07-03 DIAGNOSIS — Z981 Arthrodesis status: Secondary | ICD-10-CM | POA: Diagnosis not present

## 2017-07-03 DIAGNOSIS — I110 Hypertensive heart disease with heart failure: Secondary | ICD-10-CM | POA: Diagnosis present

## 2017-07-03 DIAGNOSIS — E785 Hyperlipidemia, unspecified: Secondary | ICD-10-CM | POA: Diagnosis present

## 2017-07-03 DIAGNOSIS — R001 Bradycardia, unspecified: Secondary | ICD-10-CM | POA: Diagnosis not present

## 2017-07-03 DIAGNOSIS — I2510 Atherosclerotic heart disease of native coronary artery without angina pectoris: Secondary | ICD-10-CM | POA: Diagnosis present

## 2017-07-03 DIAGNOSIS — E271 Primary adrenocortical insufficiency: Secondary | ICD-10-CM | POA: Diagnosis present

## 2017-07-03 DIAGNOSIS — Z951 Presence of aortocoronary bypass graft: Secondary | ICD-10-CM

## 2017-07-03 DIAGNOSIS — I4891 Unspecified atrial fibrillation: Secondary | ICD-10-CM | POA: Diagnosis present

## 2017-07-03 DIAGNOSIS — I481 Persistent atrial fibrillation: Secondary | ICD-10-CM | POA: Diagnosis present

## 2017-07-03 DIAGNOSIS — I10 Essential (primary) hypertension: Secondary | ICD-10-CM | POA: Diagnosis present

## 2017-07-03 DIAGNOSIS — F329 Major depressive disorder, single episode, unspecified: Secondary | ICD-10-CM | POA: Diagnosis present

## 2017-07-03 DIAGNOSIS — Z79899 Other long term (current) drug therapy: Secondary | ICD-10-CM

## 2017-07-03 DIAGNOSIS — E039 Hypothyroidism, unspecified: Secondary | ICD-10-CM | POA: Diagnosis present

## 2017-07-03 DIAGNOSIS — F419 Anxiety disorder, unspecified: Secondary | ICD-10-CM | POA: Diagnosis present

## 2017-07-03 DIAGNOSIS — K219 Gastro-esophageal reflux disease without esophagitis: Secondary | ICD-10-CM | POA: Diagnosis present

## 2017-07-03 DIAGNOSIS — I5032 Chronic diastolic (congestive) heart failure: Secondary | ICD-10-CM | POA: Diagnosis present

## 2017-07-03 DIAGNOSIS — Z7982 Long term (current) use of aspirin: Secondary | ICD-10-CM

## 2017-07-03 DIAGNOSIS — I480 Paroxysmal atrial fibrillation: Secondary | ICD-10-CM | POA: Diagnosis present

## 2017-07-03 LAB — BASIC METABOLIC PANEL
Anion gap: 9 (ref 5–15)
BUN: 21 mg/dL — AB (ref 6–20)
CHLORIDE: 105 mmol/L (ref 101–111)
CO2: 25 mmol/L (ref 22–32)
CREATININE: 1.03 mg/dL — AB (ref 0.44–1.00)
Calcium: 9.4 mg/dL (ref 8.9–10.3)
GFR calc Af Amer: 59 mL/min — ABNORMAL LOW (ref >60)
GFR, EST NON AFRICAN AMERICAN: 51 mL/min — AB (ref >60)
Glucose, Bld: 122 mg/dL — ABNORMAL HIGH (ref 65–99)
POTASSIUM: 4.1 mmol/L (ref 3.5–5.1)
Sodium: 139 mmol/L (ref 135–145)

## 2017-07-03 LAB — MAGNESIUM: Magnesium: 1.9 mg/dL (ref 1.7–2.4)

## 2017-07-03 MED ORDER — HYDROCORTISONE 10 MG PO TABS
10.0000 mg | ORAL_TABLET | Freq: Every evening | ORAL | Status: DC
  Administered 2017-07-03 – 2017-07-05 (×3): 10 mg via ORAL
  Filled 2017-07-03 (×4): qty 1

## 2017-07-03 MED ORDER — CLONAZEPAM 0.125 MG PO TBDP: 0.2500 mg | ORAL_TABLET | Freq: Two times a day (BID) | ORAL | Status: DC | PRN

## 2017-07-03 MED ORDER — PANTOPRAZOLE SODIUM 40 MG PO TBEC
40.0000 mg | DELAYED_RELEASE_TABLET | Freq: Every day | ORAL | Status: DC
  Administered 2017-07-04 – 2017-07-06 (×3): 40 mg via ORAL
  Filled 2017-07-03 (×3): qty 1

## 2017-07-03 MED ORDER — IRBESARTAN 150 MG PO TABS
300.0000 mg | ORAL_TABLET | Freq: Every evening | ORAL | Status: DC
  Administered 2017-07-03 – 2017-07-05 (×3): 300 mg via ORAL
  Filled 2017-07-03 (×4): qty 2

## 2017-07-03 MED ORDER — ASPIRIN EC 81 MG PO TBEC
81.0000 mg | DELAYED_RELEASE_TABLET | ORAL | Status: DC
  Administered 2017-07-04 – 2017-07-06 (×2): 81 mg via ORAL
  Filled 2017-07-03 (×3): qty 1

## 2017-07-03 MED ORDER — HYOSCYAMINE SULFATE 0.125 MG SL SUBL
0.1250 mg | SUBLINGUAL_TABLET | Freq: Every day | SUBLINGUAL | Status: DC | PRN
  Filled 2017-07-03: qty 1

## 2017-07-03 MED ORDER — HYDROCORTISONE 20 MG PO TABS
20.0000 mg | ORAL_TABLET | Freq: Every day | ORAL | Status: DC
  Administered 2017-07-04 – 2017-07-06 (×3): 20 mg via ORAL
  Filled 2017-07-03 (×3): qty 1

## 2017-07-03 MED ORDER — CLONAZEPAM 0.5 MG PO TABS: 0.2500 mg | ORAL_TABLET | Freq: Two times a day (BID) | ORAL | Status: DC | PRN

## 2017-07-03 MED ORDER — POTASSIUM CHLORIDE ER 10 MEQ PO TBCR
20.0000 meq | EXTENDED_RELEASE_TABLET | Freq: Every evening | ORAL | Status: DC
  Filled 2017-07-03: qty 2

## 2017-07-03 MED ORDER — IRBESARTAN 150 MG PO TABS: 300.0000 mg | ORAL_TABLET | Freq: Every day | ORAL | Status: DC

## 2017-07-03 MED ORDER — POTASSIUM CHLORIDE ER 10 MEQ PO TBCR: 20.0000 meq | EXTENDED_RELEASE_TABLET | Freq: Every day | ORAL | Status: DC

## 2017-07-03 MED ORDER — FLUOXETINE HCL 10 MG PO CAPS
10.0000 mg | ORAL_CAPSULE | Freq: Every morning | ORAL | Status: DC
  Administered 2017-07-04 – 2017-07-06 (×3): 10 mg via ORAL
  Filled 2017-07-03 (×3): qty 1

## 2017-07-03 MED ORDER — NITROGLYCERIN 0.4 MG SL SUBL: 0.4000 mg | SUBLINGUAL_TABLET | SUBLINGUAL | Status: DC | PRN

## 2017-07-03 MED ORDER — POTASSIUM CHLORIDE CRYS ER 20 MEQ PO TBCR
20.0000 meq | EXTENDED_RELEASE_TABLET | Freq: Every day | ORAL | Status: DC
  Administered 2017-07-03 – 2017-07-06 (×4): 20 meq via ORAL
  Filled 2017-07-03 (×4): qty 1

## 2017-07-03 MED ORDER — APIXABAN 5 MG PO TABS
5.0000 mg | ORAL_TABLET | Freq: Two times a day (BID) | ORAL | Status: DC
  Administered 2017-07-03 – 2017-07-06 (×6): 5 mg via ORAL
  Filled 2017-07-03 (×6): qty 1

## 2017-07-03 MED ORDER — DILTIAZEM HCL ER COATED BEADS 180 MG PO CP24
180.0000 mg | ORAL_CAPSULE | Freq: Every day | ORAL | Status: DC
  Administered 2017-07-04: 180 mg via ORAL
  Filled 2017-07-03: qty 1

## 2017-07-03 MED ORDER — ACETAMINOPHEN 500 MG PO TABS
500.0000 mg | ORAL_TABLET | Freq: Every day | ORAL | Status: DC | PRN
  Administered 2017-07-05: 500 mg via ORAL
  Filled 2017-07-03: qty 1

## 2017-07-03 MED ORDER — FUROSEMIDE 20 MG PO TABS: 20.0000 mg | ORAL_TABLET | ORAL | Status: DC | PRN

## 2017-07-03 MED ORDER — LEVOTHYROXINE SODIUM 88 MCG PO TABS
88.0000 ug | ORAL_TABLET | Freq: Every day | ORAL | Status: DC
  Administered 2017-07-04 – 2017-07-06 (×3): 88 ug via ORAL
  Filled 2017-07-03 (×3): qty 1

## 2017-07-03 MED ORDER — ONDANSETRON HCL 4 MG/2ML IJ SOLN: 4.0000 mg | Freq: Four times a day (QID) | INTRAMUSCULAR | Status: DC | PRN

## 2017-07-03 MED ORDER — SOTALOL HCL 80 MG PO TABS
80.0000 mg | ORAL_TABLET | Freq: Two times a day (BID) | ORAL | Status: DC
  Administered 2017-07-03 – 2017-07-04 (×2): 80 mg via ORAL
  Filled 2017-07-03 (×2): qty 1

## 2017-07-03 NOTE — H&P (Addendum)
Pt arrived to room 6e15. No complaints. Afib in the 80's on telemetry while at rest. Home meds reviewed with pt and ordered. Will start sotalol 80 mg bid per Dr. Rayann Heman.   [name], AGNP-C [facility] 07/03/2017  6:32 PM Pager: [phone]        Primary Care Physician: Crist Infante, MD Referring Physician: Dr. Jannet Askew is a 77 y.o. female with a h/o coronary artery disease with prior bypass grafting, atrial fibrillation resulting in acute diastolic heart failure, hypertension, chronic anticoagulation therapy with Eliquisand hyperlipidemia.Was on amiodarone which was subsequently discontinued  In 2017,2/2 to intolerance, but pt can not remember in which  way she did not tolerate.  She was found to be in  afib 3/6 when seen by [name] and subsequently had successful cardioversion 3/7. She was seen in the afib clinic, 3/11, at Dr. Thompson Caul suggestion to get pt on sotalol, as she is very symptomatic in afib.[name]  She was in SR and feels improved. She is also on eliquis 5 mg bid and has not had any missed doses. She had a long qt around 500 ms and discussed with Dr. Rayann Heman who suggested that pt contact PCP to see if Prozac dose  could be cut in half, to minimize qt to get sotalol on board. Pt did this and qt did shorten.  Pt is in clinic, 4/2, and is ready to come into hospital for sotalol load. She started feeling lightheaded yesterday and is found to be in  afib with v rate at 117 bpm. No missed doses of eliquis 5 mg bid. No benadryl use.   Today, she denies symptoms of  chest pain, shortness of breath, orthopnea, PND, lower extremity edema, dizziness, presyncope, syncope, or neurologic sequela.+ for palpitations and fatigue/lightheadedness. The patient is tolerating medications without difficulties and is otherwise without complaint today.       Past Medical History:  Diagnosis Date  . Addison's disease ([place])   . Angina   . Anxiety   . Arthritis     "everywhere"  . Atrial fibrillation ([place])   . Chronic lower back pain   . 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  last knee surgery dr. Elmyra Ricks gave large dose of hydrocortisal and avoided the hypotensive side effectas of addison's disease.  . Coronary artery disease   . Depression   . Dysrhythmia   . GERD (gastroesophageal reflux disease)   . Hyperlipemia   . Hypertension   . Hypothyroidism   . Lumbar radiculopathy 04/27/2014  . PONV (postoperative nausea and vomiting)   . Recurrent upper respiratory infection (URI)   . Shortness of breath         Past Surgical History:  Procedure Laterality Date  . ACHILLES TENDON SURGERY  07/16/14   left ankle, bone spur removed  . ANTERIOR LUMBAR FUSION  1980   L4-5  . BACK SURGERY    . CARDIAC CATHETERIZATION  2013  . CARDIAC CATHETERIZATION N/A 09/02/2014   Procedure: Left Heart Cath and Coronary Angiography;  Surgeon: Wellington Hampshire, MD;  Location: [place] [facility];  Service: Cardiovascular;  Laterality: N/A;  . CARDIOVERSION N/A 10/22/2015   Procedure: CARDIOVERSION;  Surgeon: Sanda Klein, MD;  Location: [place] [facility];  Service: Cardiovascular;  Laterality: N/A;  . CARDIOVERSION N/A 06/07/2017   Procedure: CARDIOVERSION;  Surgeon: Larey Dresser, MD;  Location: [place];  Service: Cardiovascular;  Laterality: N/A;  .  CARPAL TUNNEL RELEASE Right   . CATARACT EXTRACTION, BILATERAL Bilateral   . CORONARY ARTERY BYPASS GRAFT  06/26/2011   Procedure: CORONARY ARTERY BYPASS GRAFTING (CABG);  Surgeon: Melrose Nakayama, MD;  Location: [facility];  Service: Open Heart Surgery;  Laterality: N/A;  times using Greater Saphenous Vein Graft harvested endoscopically from right leg  . DIAGNOSTIC LAPAROSCOPY    . DILATION AND CURETTAGE OF UTERUS    . JOINT REPLACEMENT    . KNEE ARTHROSCOPY Bilateral   . LAPAROSCOPIC CHOLECYSTECTOMY    . Nerve stimulator of  back  12/10/15  . REVISION TOTAL KNEE ARTHROPLASTY Right   . TONSILLECTOMY    . TOTAL KNEE ARTHROPLASTY Bilateral           Current Outpatient Medications  Medication Sig Dispense Refill  . acetaminophen (TYLENOL) 500 MG tablet Take 500 mg by mouth daily as needed for headache.    [name] aspirin EC 81 MG tablet Take 81 mg by mouth every Monday, Wednesday, and Friday.    . clonazePAM (KLONOPIN) 0.5 MG tablet TAKE 1/2 TO 1 TABLET TWICE DAILY AS NEEDED (Patient taking differently: TAKE 1/2 TO 1 TABLET TWICE DAILY AS NEEDED FOR ANXIETY) 60 tablet 5  . CYANOCOBALAMIN IJ Take one (1) injection as directed every other month.    . diltiazem (CARDIZEM CD) 180 MG 24 hr capsule Take 1 capsule (180 mg total) by mouth daily. 90 capsule 3  . ELIQUIS 5 MG TABS tablet TAKE ONE TABLET TWICE DAILY 60 tablet 5  . FLUoxetine (PROZAC) 20 MG capsule Take 10 mg by mouth every morning.     . furosemide (LASIX) 20 MG tablet Take 20 mg by mouth as needed for fluid or edema.    . hydrocortisone (CORTEF) 20 MG tablet Take 20 mg by mouth each morning and 10 mg by mouth each evening.    . hyoscyamine (LEVSIN SL) 0.125 MG SL tablet Place 0.125 mg under the tongue daily as needed for cramping (IBS symptoms).    [name] levothyroxine (SYNTHROID, LEVOTHROID) 88 MCG tablet Take 1 tablet (88 mcg total) by mouth daily before breakfast. 30 tablet 0  . nitroGLYCERIN (NITROSTAT) 0.4 MG SL tablet Place 0.4 mg under the tongue every 5 (five) minutes as needed for chest pain (3 DOSES MAX FOR CHEST PAIN).    [name] omeprazole (PRILOSEC) 20 MG capsule Take 20 mg by mouth every morning.     . potassium chloride (K-DUR) 10 MEQ tablet Take 2 tablets (20 mEq total) by mouth daily. 60 tablet 3  . Probiotic Product (PROBIOTIC FORMULA PO) Take 1 capsule by mouth every morning. For upset stomach    . valsartan (DIOVAN) 320 MG tablet 1 tablet daily.    . Vitamin D, Ergocalciferol, (DRISDOL) 50000 UNITS CAPS Take 50,000 Units by mouth  2 (two) times a week. Takes on Tuesdays and Fridays     No current facility-administered medications for this encounter.          Allergies  Allergen Reactions  . Zanaflex [Tizanidine] Other (See Comments)    HALLUCINATIONS  . Percocet [Oxycodone-Acetaminophen] Other (See Comments)    Hallucination  . Tramadol     Hallucinations.  . Sulfa Antibiotics Itching    Vaginal itching  . Sulfacetamide Sodium Itching    Vaginal itching    Social History        Socioeconomic History  . Marital status: Widowed    Spouse name: Not on file  . Number of children: Not on file  .  Years of education: Not on file  . Highest education level: Not on file  Occupational History  . Occupation: Retired  Scientific laboratory technician  . Financial resource strain: Not on file  . Food insecurity:    Worry: Not on file    Inability: Not on file  . Transportation needs:    Medical: Not on file    Non-medical: Not on file  Tobacco Use  . Smoking status: Passive Smoke Exposure - Never Smoker  . Smokeless tobacco: Never Used  Substance and Sexual Activity  . Alcohol use: No    Alcohol/week: 0.0 oz  . Drug use: No  . Sexual activity: Never    Birth control/protection: Post-menopausal  Lifestyle  . Physical activity:    Days per week: Not on file    Minutes per session: Not on file  . Stress: Not on file  Relationships  . Social connections:    Talks on phone: Not on file    Gets together: Not on file    Attends religious service: Not on file    Active member of club or organization: Not on file    Attends meetings of clubs or organizations: Not on file    Relationship status: Not on file  . Intimate partner violence:    Fear of current or ex partner: Not on file    Emotionally abused: Not on file    Physically abused: Not on file    Forced sexual activity: Not on file  Other Topics Concern  . Not on file  Social History Narrative  . Not on  file         Family History  Problem Relation Age of Onset  . Heart attack Father   . Hypertension Mother   . Stroke Mother   . Hypertension Maternal Grandfather   . Colon cancer Neg Hx     ROS- All systems are reviewed and negative except as per the HPI above  Physical Exam:    Vitals:   07/03/17 1101  BP: (!) 122/56  Pulse: (!) 117  Weight: 198 lb 12.8 oz (90.2 kg)  Height: 5' 4.5" (1.638 m)      Wt Readings from Last 3 Encounters:  07/03/17 198 lb 12.8 oz (90.2 kg)  06/11/17 205 lb (93 kg)  06/07/17 202 lb (91.6 kg)    Labs: RecentLabs       Lab Results  Component Value Date   NA 139 07/03/2017   K 4.1 07/03/2017   CL 105 07/03/2017   CO2 25 07/03/2017   GLUCOSE 122 (H) 07/03/2017   BUN 21 (H) 07/03/2017   CREATININE 1.03 (H) 07/03/2017   CALCIUM 9.4 07/03/2017   MG 1.9 07/03/2017     RecentLabs       Lab Results  Component Value Date   INR 1.1 06/06/2017     RecentLabs       Lab Results  Component Value Date   CHOL 100 09/02/2014   HDL 40 (L) 09/02/2014   LDLCALC 44 09/02/2014   TRIG 78 09/02/2014       GEN- The patient is well appearing, alert and oriented x 3 today.   Head- normocephalic, atraumatic Eyes-  Sclera clear, conjunctiva pink Ears- hearing intact Oropharynx- clear Neck- supple, no JVP Lymph- no cervical lymphadenopathy Lungs- Clear to ausculation bilaterally, normal work of breathing Heart-irregular rate and rhythm, no murmurs, rubs or gallops, PMI not laterally displaced GI- soft, NT, ND, + BS Extremities- no clubbing, cyanosis, or edema  MS- no significant deformity or atrophy Skin- no rash or lesion Psych- euthymic mood, full affect Neuro- strength and sensation are intact  EKG- atrial fib with v rate of 117 bpm, qrs int 108 ms, qtc 451 ms EPIC records reviewed    Assessment and Plan: 1. Paroxysmal afib Successful cardioversion early March  Options discussed to maintain  SR [name] suggested sotalol, pt afraid she may not be able to afford tikosyn Pt did decrease Prozac by half to shorten up qt interval, today in afib it is 451 ms  States no missed doses of eliquis 5 mg bid No benadryl PharmD screened drugs with only concern being prozac, not contraindicated but can prolong qt. Bmet/mag today with K+ at 41. Mag at 1.9, crcl cal at [facility] [address], [facility] [place], [place] [address] [phone]       Thompson Grayer, MD

## 2017-07-03 NOTE — Progress Notes (Signed)
Primary Care Physician: Crist Infante, MD Referring Physician: Dr. Jannet Askew is a 77 y.o. female with a h/o coronary artery disease with prior bypass grafting, atrial fibrillation resulting in acute diastolic heart failure, hypertension, chronic anticoagulation therapy with Eliquisand hyperlipidemia.Was on amiodarone which was subsequently discontinued  In 2017,2/2 to intolerance, but pt can not remember in which  way she did not tolerate.  She was found to be in  afib 3/6 when seen by [name] and subsequently had successful cardioversion 3/7. She was seen in the afib clinic, 3/11, at Dr. Thompson Caul suggestion to get pt on sotalol, as she is very symptomatic in afib.[name]  She was in SR and feels improved. She is also on eliquis 5 mg bid and has not had any missed doses. She had a long qt around 500 ms and discussed with Dr. Rayann Heman who suggested that pt contact PCP to see if Prozac dose  could be cut in half, to minimize qt to get sotalol on board. Pt did this and qt did shorten.  Pt is in clinic, 4/2, and is ready to come into hospital for sotalol load. She started feeling lightheaded yesterday and is found to be in  afib with v rate at 117 bpm. No missed doses of eliquis 5 mg bid. No benadryl use.   Today, she denies symptoms of  chest pain, shortness of breath, orthopnea, PND, lower extremity edema, dizziness, presyncope, syncope, or neurologic sequela.+ for palpitations and fatigue/lightheadedness. The patient is tolerating medications without difficulties and is otherwise without complaint today.   Past Medical History:  Diagnosis Date  . Addison's disease ([place])   . Angina   . Anxiety   . Arthritis    "everywhere"  . Atrial fibrillation ([place])   . Chronic lower back pain   . 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  last knee surgery dr. Elmyra Ricks gave large dose of hydrocortisal and avoided the hypotensive side effectas of  addison's disease.  . Coronary artery disease   . Depression   . Dysrhythmia   . GERD (gastroesophageal reflux disease)   . Hyperlipemia   . Hypertension   . Hypothyroidism   . Lumbar radiculopathy 04/27/2014  . PONV (postoperative nausea and vomiting)   . Recurrent upper respiratory infection (URI)   . Shortness of breath    Past Surgical History:  Procedure Laterality Date  . ACHILLES TENDON SURGERY  07/16/14   left ankle, bone spur removed  . ANTERIOR LUMBAR FUSION  1980   L4-5  . BACK SURGERY    . CARDIAC CATHETERIZATION  2013  . CARDIAC CATHETERIZATION N/A 09/02/2014   Procedure: Left Heart Cath and Coronary Angiography;  Surgeon: Wellington Hampshire, MD;  Location: [facility];  Service: Cardiovascular;  Laterality: N/A;  . CARDIOVERSION N/A 10/22/2015   Procedure: CARDIOVERSION;  Surgeon: Sanda Klein, MD;  Location: [facility];  Service: Cardiovascular;  Laterality: N/A;  . CARDIOVERSION N/A 06/07/2017   Procedure: CARDIOVERSION;  Surgeon: Larey Dresser, MD;  Location: [facility] ENDOSCOPY;  Service: Cardiovascular;  Laterality: N/A;  . CARPAL TUNNEL RELEASE Right   . CATARACT EXTRACTION, BILATERAL Bilateral   . CORONARY ARTERY BYPASS GRAFT  06/26/2011   Procedure: CORONARY ARTERY BYPASS GRAFTING (CABG);  Surgeon: Melrose Nakayama, MD;  Location: [place] [facility];  Service: Open Heart Surgery;  Laterality: N/A;  times using Greater Saphenous Vein Graft harvested endoscopically from right leg  . DIAGNOSTIC LAPAROSCOPY    .  DILATION AND CURETTAGE OF UTERUS    . JOINT REPLACEMENT    . KNEE ARTHROSCOPY Bilateral   . LAPAROSCOPIC CHOLECYSTECTOMY    . Nerve stimulator of back  12/10/15  . REVISION TOTAL KNEE ARTHROPLASTY Right   . TONSILLECTOMY    . TOTAL KNEE ARTHROPLASTY Bilateral     Current Outpatient Medications  Medication Sig Dispense Refill  . acetaminophen (TYLENOL) 500 MG tablet Take 500 mg by mouth daily as needed for headache.    [name] aspirin EC 81 MG tablet Take 81 mg by  mouth every Monday, Wednesday, and Friday.    . clonazePAM (KLONOPIN) 0.5 MG tablet TAKE 1/2 TO 1 TABLET TWICE DAILY AS NEEDED (Patient taking differently: TAKE 1/2 TO 1 TABLET TWICE DAILY AS NEEDED FOR ANXIETY) 60 tablet 5  . CYANOCOBALAMIN IJ Take one (1) injection as directed every other month.    . diltiazem (CARDIZEM CD) 180 MG 24 hr capsule Take 1 capsule (180 mg total) by mouth daily. 90 capsule 3  . ELIQUIS 5 MG TABS tablet TAKE ONE TABLET TWICE DAILY 60 tablet 5  . FLUoxetine (PROZAC) 20 MG capsule Take 10 mg by mouth every morning.     . furosemide (LASIX) 20 MG tablet Take 20 mg by mouth as needed for fluid or edema.    . hydrocortisone (CORTEF) 20 MG tablet Take 20 mg by mouth each morning and 10 mg by mouth each evening.    . hyoscyamine (LEVSIN SL) 0.125 MG SL tablet Place 0.125 mg under the tongue daily as needed for cramping (IBS symptoms).    [name] levothyroxine (SYNTHROID, LEVOTHROID) 88 MCG tablet Take 1 tablet (88 mcg total) by mouth daily before breakfast. 30 tablet 0  . nitroGLYCERIN (NITROSTAT) 0.4 MG SL tablet Place 0.4 mg under the tongue every 5 (five) minutes as needed for chest pain (3 DOSES MAX FOR CHEST PAIN).    [name] omeprazole (PRILOSEC) 20 MG capsule Take 20 mg by mouth every morning.     . potassium chloride (K-DUR) 10 MEQ tablet Take 2 tablets (20 mEq total) by mouth daily. 60 tablet 3  . Probiotic Product (PROBIOTIC FORMULA PO) Take 1 capsule by mouth every morning. For upset stomach    . valsartan (DIOVAN) 320 MG tablet 1 tablet daily.    . Vitamin D, Ergocalciferol, (DRISDOL) 50000 UNITS CAPS Take 50,000 Units by mouth 2 (two) times a week. Takes on Tuesdays and Fridays     No current facility-administered medications for this encounter.     Allergies  Allergen Reactions  . Zanaflex [Tizanidine] Other (See Comments)    HALLUCINATIONS  . Percocet [Oxycodone-Acetaminophen] Other (See Comments)    Hallucination  . Tramadol     Hallucinations.  . Sulfa  Antibiotics Itching    Vaginal itching  . Sulfacetamide Sodium Itching    Vaginal itching    Social History   Socioeconomic History  . Marital status: Widowed    Spouse name: Not on file  . Number of children: Not on file  . Years of education: Not on file  . Highest education level: Not on file  Occupational History  . Occupation: Retired  Scientific laboratory technician  . Financial resource strain: Not on file  . Food insecurity:    Worry: Not on file    Inability: Not on file  . Transportation needs:    Medical: Not on file    Non-medical: Not on file  Tobacco Use  . Smoking status: Passive Smoke Exposure - Never Smoker  .  Smokeless tobacco: Never Used  Substance and Sexual Activity  . Alcohol use: No    Alcohol/week: 0.0 oz  . Drug use: No  . Sexual activity: Never    Birth control/protection: Post-menopausal  Lifestyle  . Physical activity:    Days per week: Not on file    Minutes per session: Not on file  . Stress: Not on file  Relationships  . Social connections:    Talks on phone: Not on file    Gets together: Not on file    Attends religious service: Not on file    Active member of club or organization: Not on file    Attends meetings of clubs or organizations: Not on file    Relationship status: Not on file  . Intimate partner violence:    Fear of current or ex partner: Not on file    Emotionally abused: Not on file    Physically abused: Not on file    Forced sexual activity: Not on file  Other Topics Concern  . Not on file  Social History Narrative  . Not on file    Family History  Problem Relation Age of Onset  . Heart attack Father   . Hypertension Mother   . Stroke Mother   . Hypertension Maternal Grandfather   . Colon cancer Neg Hx     ROS- All systems are reviewed and negative except as per the HPI above  Physical Exam: Vitals:   07/03/17 1101  BP: (!) 122/56  Pulse: (!) 117  Weight: 198 lb 12.8 oz (90.2 kg)  Height: 5' 4.5" (1.638 m)   Wt  Readings from Last 3 Encounters:  07/03/17 198 lb 12.8 oz (90.2 kg)  06/11/17 205 lb (93 kg)  06/07/17 202 lb (91.6 kg)    Labs: Lab Results  Component Value Date   NA 139 07/03/2017   K 4.1 07/03/2017   CL 105 07/03/2017   CO2 25 07/03/2017   GLUCOSE 122 (H) 07/03/2017   BUN 21 (H) 07/03/2017   CREATININE 1.03 (H) 07/03/2017   CALCIUM 9.4 07/03/2017   MG 1.9 07/03/2017   Lab Results  Component Value Date   INR 1.1 06/06/2017   Lab Results  Component Value Date   CHOL 100 09/02/2014   HDL 40 (L) 09/02/2014   LDLCALC 44 09/02/2014   TRIG 78 09/02/2014     GEN- The patient is well appearing, alert and oriented x 3 today.   Head- normocephalic, atraumatic Eyes-  Sclera clear, conjunctiva pink Ears- hearing intact Oropharynx- clear Neck- supple, no JVP Lymph- no cervical lymphadenopathy Lungs- Clear to ausculation bilaterally, normal work of breathing Heart-irregular rate and rhythm, no murmurs, rubs or gallops, PMI not laterally displaced GI- soft, NT, ND, + BS Extremities- no clubbing, cyanosis, or edema MS- no significant deformity or atrophy Skin- no rash or lesion Psych- euthymic mood, full affect Neuro- strength and sensation are intact  EKG- atrial fib with v rate of 117 bpm, qrs int 108 ms, qtc 451 ms EPIC records reviewed    Assessment and Plan: 1. Paroxysmal afib Successful cardioversion early March  Options discussed to maintain SR [name] suggested sotalol, pt afraid she may not be able to afford tikosyn Pt did decrease Prozac by half to shorten up qt interval, today in afib it is 451 ms  States no missed doses of eliquis 5 mg bid No benadryl PharmD screened drugs with only concern being prozac, not contraindicated but can prolong qt. Bmet/mag today  with K+ at 41. Mag at 1.9, crcl cal at [facility]. Chia Mowers, [facility] [address] [place], [place] [address] [phone]

## 2017-07-04 ENCOUNTER — Ambulatory Visit (HOSPITAL_COMMUNITY): Admission: RE | Admit: 2017-07-04 | Payer: Medicare Other | Source: Ambulatory Visit | Admitting: Cardiology

## 2017-07-04 ENCOUNTER — Other Ambulatory Visit: Payer: Self-pay

## 2017-07-04 DIAGNOSIS — I481 Persistent atrial fibrillation: Principal | ICD-10-CM

## 2017-07-04 LAB — BASIC METABOLIC PANEL
ANION GAP: 11 (ref 5–15)
BUN: 17 mg/dL (ref 6–20)
CO2: 24 mmol/L (ref 22–32)
Calcium: 9.2 mg/dL (ref 8.9–10.3)
Chloride: 103 mmol/L (ref 101–111)
Creatinine, Ser: 0.97 mg/dL (ref 0.44–1.00)
GFR calc Af Amer: >60 mL/min (ref >60)
GFR, EST NON AFRICAN AMERICAN: 55 mL/min — AB (ref >60)
Glucose, Bld: 152 mg/dL — ABNORMAL HIGH (ref 65–99)
POTASSIUM: 4.6 mmol/L (ref 3.5–5.1)
SODIUM: 138 mmol/L (ref 135–145)

## 2017-07-04 LAB — MAGNESIUM: MAGNESIUM: 2 mg/dL (ref 1.7–2.4)

## 2017-07-04 MED ORDER — SODIUM CHLORIDE 0.9% FLUSH: 3.0000 mL | INTRAVENOUS | Status: DC | PRN

## 2017-07-04 MED ORDER — SOTALOL HCL 80 MG PO TABS
80.0000 mg | ORAL_TABLET | Freq: Two times a day (BID) | ORAL | Status: DC
  Administered 2017-07-04 – 2017-07-06 (×4): 80 mg via ORAL
  Filled 2017-07-04 (×4): qty 1

## 2017-07-04 MED ORDER — SODIUM CHLORIDE 0.9% FLUSH
3.0000 mL | Freq: Two times a day (BID) | INTRAVENOUS | Status: DC
  Administered 2017-07-04 – 2017-07-06 (×3): 3 mL via INTRAVENOUS

## 2017-07-04 MED ORDER — SODIUM CHLORIDE 0.9 % IV SOLN: 250.0000 mL | INTRAVENOUS | Status: DC

## 2017-07-04 NOTE — Progress Notes (Addendum)
Progress Note  Patient Name: [name] Date of Encounter: 07/04/2017  Primary Cardiologist: Sinclair Grooms, MD   Subjective   No complaints, no CP or SOB  Inpatient Medications    Scheduled Meds: . apixaban  5 mg Oral BID  . aspirin EC  81 mg Oral Q M,W,F  . diltiazem  180 mg Oral Daily  . FLUoxetine  10 mg Oral q morning - 10a  . hydrocortisone  10 mg Oral QPM  . hydrocortisone  20 mg Oral Daily  . irbesartan  300 mg Oral QPM  . levothyroxine  88 mcg Oral QAC breakfast  . pantoprazole  40 mg Oral Daily  . potassium chloride  20 mEq Oral Daily  . sotalol  80 mg Oral Q12H   Continuous Infusions:  PRN Meds: acetaminophen, clonazepam, hyoscyamine, nitroGLYCERIN, ondansetron (ZOFRAN) IV   Vital Signs    Vitals:   07/03/17 1646 07/03/17 2020 07/04/17 0500  BP: 118/89 104/70 115/60  Pulse: 63 91 68  Resp: 20 20 17   Temp: (!) 97.5 F (36.4 C) 97.6 F (36.4 C) 98 F (36.7 C)  TempSrc: Oral Oral Oral  SpO2: 97% 98% 100%  Weight: 197 lb 4.8 oz (89.5 kg)  198 lb (89.8 kg)  Height: 5' 4.5" (1.638 m)      Intake/Output Summary (Last 24 hours) at 07/04/2017 0928 Last data filed at 07/04/2017 0900 Gross per 24 hour  Intake 580 ml  Output -  Net 580 ml   Filed Weights   07/03/17 1646 07/04/17 0500  Weight: 197 lb 4.8 oz (89.5 kg) 198 lb (89.8 kg)    Telemetry    AFib, CVR - Personally Reviewed  ECG    AFlutter, 78bpm, QTc looks OK - Personally Reviewed  Physical Exam   GEN: No acute distress.   Neck: No JVD Cardiac: iRRR, no murmurs, rubs, or gallops.  Respiratory: CTA b/l. GI: Soft, nontender, non-distended  MS: No edema; No deformity. Neuro:  Nonfocal  Psych: Normal affect   Labs    Chemistry Recent Labs  Lab 06/29/17 1215 07/03/17 1050 07/04/17 0344  NA 140 139 138  K 3.6 4.1 4.6  CL 103 105 103  CO2 28 25 24   GLUCOSE 139* 122* 152*  BUN 14 21* 17  CREATININE 0.91 1.03* 0.97  CALCIUM 9.4 9.4 9.2  GFRNONAA 59* 51* 55*  GFRAA  >60 59* >60  ANIONGAP 9 9 11      HematologyNo results for input(s): WBC, RBC, HGB, HCT, MCV, MCH, MCHC, RDW, PLT in the last 168 hours.  Cardiac EnzymesNo results for input(s): TROPONINI in the last 168 hours. No results for input(s): TROPIPOC in the last 168 hours.   BNPNo results for input(s): BNP, PROBNP in the last 168 hours.   DDimer No results for input(s): DDIMER in the last 168 hours.   Radiology    No results found.  Cardiac Studies   09/20/15: TTE Study Conclusions - Left ventricle: The cavity size was normal. Wall thickness was   increased in a pattern of moderate LVH. Systolic function was   vigorous. The estimated ejection fraction was in the range of 65%   to 70%. - Mitral valve: There was mild regurgitation. - Pulmonary arteries: PA peak pressure: 35 mm Hg (S).  Patient Profile     77 y.o. female w/PMHx of CAD, chronic CHF (diastolic), HTN, hypothyroidism, and paroxysmal AFib, admitted for sotalol load.  Assessment & Plan    1. Paroxysmal AFib  CHA2DS2Vasc is 5, on Eliquis, appropriately dosed     K+ 4.6     Mag 2.0     Creat 0.97     QTc look OK, continue load       Plan for DCCV tomorrow if not in SR, patient is familiar with the procedure and agreeable  2. CAD     No anginal complaints, continue home meds  3. HTN     No changes today    For questions or updates, please contact [place] Please consult [email or web address] for contact info under Cardiology/STEMI.      Signed, Baldwin Jamaica, PA-C  07/04/2017, 9:28 AM     I have seen, examined the patient, and reviewed the above assessment and plan.  Changes to above are made where necessary.  On exam, iRRR.  Pt remains in afib.  May require cardioversion if still in AF tomorrow.  Co Sign: Thompson Grayer, MD 07/04/2017 9:02 PM

## 2017-07-04 NOTE — Plan of Care (Signed)
  Problem: Activity: Goal: Risk for activity intolerance will decrease Outcome: Completed/Met  Pt ambulates independently with steady gait

## 2017-07-05 ENCOUNTER — Inpatient Hospital Stay (HOSPITAL_COMMUNITY): Payer: Medicare Other | Admitting: Certified Registered Nurse Anesthetist

## 2017-07-05 ENCOUNTER — Encounter (HOSPITAL_COMMUNITY)
Admission: RE | Disposition: A | Discharge status: Home / Self Care | Payer: Self-pay | Source: Ambulatory Visit | Attending: Internal Medicine

## 2017-07-05 ENCOUNTER — Other Ambulatory Visit: Payer: Self-pay

## 2017-07-05 ENCOUNTER — Encounter (HOSPITAL_COMMUNITY): Payer: Self-pay | Admitting: Emergency Medicine

## 2017-07-05 DIAGNOSIS — R001 Bradycardia, unspecified: Secondary | ICD-10-CM

## 2017-07-05 DIAGNOSIS — I10 Essential (primary) hypertension: Secondary | ICD-10-CM

## 2017-07-05 HISTORY — PX: CARDIOVERSION: SHX1299

## 2017-07-05 LAB — CBC
HCT: 37.5 % (ref 36.0–46.0)
Hemoglobin: 12.3 g/dL (ref 12.0–15.0)
MCH: 29.5 pg (ref 26.0–34.0)
MCHC: 32.8 g/dL (ref 30.0–36.0)
MCV: 89.9 fL (ref 78.0–100.0)
PLATELETS: 284 10*3/uL (ref 150–400)
RBC: 4.17 MIL/uL (ref 3.87–5.11)
RDW: 13.7 % (ref 11.5–15.5)
WBC: 9 10*3/uL (ref 4.0–10.5)

## 2017-07-05 LAB — BASIC METABOLIC PANEL
ANION GAP: 8 (ref 5–15)
BUN: 20 mg/dL (ref 6–20)
CALCIUM: 9.1 mg/dL (ref 8.9–10.3)
CO2: 25 mmol/L (ref 22–32)
CREATININE: 0.85 mg/dL (ref 0.44–1.00)
Chloride: 106 mmol/L (ref 101–111)
GFR calc Af Amer: >60 mL/min (ref >60)
GLUCOSE: 141 mg/dL — AB (ref 65–99)
Potassium: 4.7 mmol/L (ref 3.5–5.1)
Sodium: 139 mmol/L (ref 135–145)

## 2017-07-05 LAB — MAGNESIUM: Magnesium: 2.1 mg/dL (ref 1.7–2.4)

## 2017-07-05 SURGERY — CARDIOVERSION
Anesthesia: General

## 2017-07-05 MED ORDER — LIDOCAINE 2% (20 MG/ML) 5 ML SYRINGE
INTRAMUSCULAR | Status: DC | PRN
  Administered 2017-07-05: 80 mg via INTRAVENOUS

## 2017-07-05 MED ORDER — EPHEDRINE SULFATE-NACL 50-0.9 MG/10ML-% IV SOSY
PREFILLED_SYRINGE | INTRAVENOUS | Status: DC | PRN
  Administered 2017-07-05: 15 mg via INTRAVENOUS

## 2017-07-05 MED ORDER — PROPOFOL 10 MG/ML IV BOLUS
INTRAVENOUS | Status: DC | PRN
  Administered 2017-07-05: 60 mg via INTRAVENOUS

## 2017-07-05 NOTE — Care Management (Addendum)
1104 07-06-17 CM did call [facility] in regards to Sotalol- medication has not been filled unable to provide pt with a cost. No further needs from CM at this time.     1656 07-05-17 Benefits check completed for Sotalol: Prior Authorization needed for Medication. Information sent to [facility].   S/W  PHYLLIS @ [employer] # [id number]    1. BETAPACE 80 MG EVERY 12 HRS  [place] # [phone]   2. SOTALOL 80 MG EVERY 12 HRS  COVER- YES  TIER- 1 DRUG  PRIOR APPROVAL- YES # [phone]   PREFERRED PHARMACY : [facility]

## 2017-07-05 NOTE — Anesthesia Preprocedure Evaluation (Addendum)
Anesthesia Evaluation  Patient identified by MRN, date of birth, ID band Patient awake    Reviewed: Allergy & Precautions, NPO status , Patient's Chart, lab work & pertinent test results  History of Anesthesia Complications (+) PONV and history of anesthetic complications  Airway Mallampati: II  TM Distance: >3 FB Neck ROM: Full    Dental  (+) Teeth Intact   Pulmonary sleep apnea ,    Pulmonary exam normal breath sounds clear to auscultation       Cardiovascular hypertension, Pt. on medications + CAD, + CABG and + DOE  + dysrhythmias Atrial Fibrillation + Valvular Problems/Murmurs MR  Rhythm:Irregular Rate:Abnormal  '17 TTE: Moderate LVH. EF 65% to 70%. Mild MR. PA peak pressure: 35 mm Hg (S).   Neuro/Psych PSYCHIATRIC DISORDERS Anxiety Depression  Neuromuscular disease    GI/Hepatic Neg liver ROS, GERD  Medicated and Controlled,  Endo/Other  Hypothyroidism Addison's disease, obesity  Renal/GU negative Renal ROS     Musculoskeletal  (+) Arthritis ,   Abdominal   Peds  Hematology  (+) anemia ,   Anesthesia Other Findings   Reproductive/Obstetrics                           Anesthesia Physical  Anesthesia Plan  ASA: III  Anesthesia Plan: General   Post-op Pain Management:    Induction: Intravenous  PONV Risk Score and Plan: 4 or greater and Treatment may vary due to age or medical condition and Propofol infusion  Airway Management Planned: Mask and Natural Airway  Additional Equipment: None  Intra-op Plan:   Post-operative Plan:   Informed Consent: I have reviewed the patients History and Physical, chart, labs and discussed the procedure including the risks, benefits and alternatives for the proposed anesthesia with the patient or authorized representative who has indicated his/her understanding and acceptance.   Dental advisory given  Plan Discussed with: CRNA and  Anesthesiologist  Anesthesia Plan Comments:        Anesthesia Quick Evaluation

## 2017-07-05 NOTE — Progress Notes (Signed)
Progress Note   Subjective   Doing well today, the patient denies CP or SOB.  No new concerns  Inpatient Medications    Scheduled Meds: . apixaban  5 mg Oral BID  . aspirin EC  81 mg Oral Q M,W,F  . FLUoxetine  10 mg Oral q morning - 10a  . hydrocortisone  10 mg Oral QPM  . hydrocortisone  20 mg Oral Daily  . irbesartan  300 mg Oral QPM  . levothyroxine  88 mcg Oral QAC breakfast  . pantoprazole  40 mg Oral Daily  . potassium chloride  20 mEq Oral Daily  . sodium chloride flush  3 mL Intravenous Q12H  . sotalol  80 mg Oral Q12H   Continuous Infusions: . sodium chloride     PRN Meds: acetaminophen, clonazepam, hyoscyamine, nitroGLYCERIN, ondansetron (ZOFRAN) IV, sodium chloride flush   Vital Signs    Vitals:   07/04/17 0500 07/04/17 1326 07/04/17 2139 07/05/17 0355  BP: 115/60 (!) 111/55 125/70 (!) 108/59  Pulse: 68 64 64 (!) 47  Resp: 17   16  Temp: 98 F (36.7 C) 98.2 F (36.8 C) 97.8 F (36.6 C) 97.7 F (36.5 C)  TempSrc: Oral Oral Oral Oral  SpO2: 100% 100% 98% 97%  Weight: 89.8 kg (198 lb)   89.5 kg (197 lb 4.8 oz)  Height:        Intake/Output Summary (Last 24 hours) at 07/05/2017 0733 Last data filed at 07/04/2017 1751 Gross per 24 hour  Intake 900 ml  Output -  Net 900 ml   Filed Weights   07/03/17 1646 07/04/17 0500 07/05/17 0355  Weight: 89.5 kg (197 lb 4.8 oz) 89.8 kg (198 lb) 89.5 kg (197 lb 4.8 oz)    Telemetry    afib with slow ventricular response (ncoturnal) - Personally Reviewed  Physical Exam   GEN- The patient is well appearing, sleeping but rouses Head- normocephalic, atraumatic Eyes-  Sclera clear, conjunctiva pink Ears- hearing intact Oropharynx- clear Neck- supple, Lungs- Clear to ausculation bilaterally, normal work of breathing Heart- irregular rate and rhythm  GI- soft, NT, ND, + BS Extremities- no clubbing, cyanosis, or edema  MS- no significant deformity or atrophy Skin- no rash or lesion Psych- euthymic mood, full  affect Neuro- strength and sensation are intact   Labs    Chemistry Recent Labs  Lab 07/03/17 1050 07/04/17 0344 07/05/17 0349  NA 139 138 139  K 4.1 4.6 4.7  CL 105 103 106  CO2 25 24 25   GLUCOSE 122* 152* 141*  BUN 21* 17 20  CREATININE 1.03* 0.97 0.85  CALCIUM 9.4 9.2 9.1  GFRNONAA 51* 55* >60  GFRAA 59* >60 >60  ANIONGAP 9 11 8      Hematology Recent Labs  Lab 07/05/17 0349  WBC 9.0  RBC 4.17  HGB 12.3  HCT 37.5  MCV 89.9  MCH 29.5  MCHC 32.8  RDW 13.7  PLT 284    Cardiac EnzymesNo results for input(s): TROPONINI in the last 168 hours. No results for input(s): TROPIPOC in the last 168 hours.      Assessment & Plan    1.  Persistent afib chads2vasc score is 5.   On sotalol 80mg  BID Planned for cardioversion today  2. Bradycardia Appears to be mostly nocturnal As she wakes up and moves around it increases Diltiazem discontinued this am  3. CAD No ischemic symptoms  4. HTN Stable No change required today  Anticipate discharge to home tomorrow.  Thompson Grayer MD, [facility] 07/05/2017 7:33 AM

## 2017-07-05 NOTE — Discharge Summary (Signed)
ELECTROPHYSIOLOGY PROCEDURE DISCHARGE SUMMARY    Patient ID: [name],  MRN: [id number], DOB/AGE: 07/09/1940 77 y.o.  Admit date: 07/03/2017 Discharge date: 07/06/2017  Primary Care Physician: Crist Infante, MD  Primary Cardiologist: [name] Electrophysiologist: new to Dr. Rayann Heman this admission  Primary Discharge Diagnosis:  1.  Paroxysmal atrial fibrillation status post Sotalol loading and DCCV this admission      CHA2DS2Vasc is 5, on Eliquis  Secondary Discharge Diagnosis:  1. CAD 2. HTN 3. Chronic CHF (diastolic) 4. Hypothyroidism  Allergies  Allergen Reactions  . Zanaflex [Tizanidine] Other (See Comments)    HALLUCINATIONS  . Percocet [Oxycodone-Acetaminophen] Other (See Comments)    Hallucination  . Tramadol     Hallucinations.  . Sulfa Antibiotics Itching    Vaginal itching  . Sulfacetamide Sodium Itching    Vaginal itching     Procedures This Admission:  1.  Sotalol loading 2.  Direct current cardioversion on 07/05/17 by Dr Debara Pickett which successfully restored SR.  There were no early apparent complications.   Brief HPI: SONIYAH MCGLORY is a 77 y.o. female with a past medical history as noted above.  She was referred to the AFib clinic in the outpatient setting for treatment options of atrial fibrillation.  Risks, benefits, and alternatives to Sotalol were reviewed with the patient who wished to proceed.    Hospital Course:  The patient was admitted and Sotalol was initiated.  Renal function and electrolytes were followed during the hospitalization and were stable. Her QTc remained stable.  On 07/05/17 she underwent direct current cardioversion which restored sinus rhythm.  She was monitored until discharge on telemetry which demonstrated NSR.  On the day of discharge, the patient was examined by Dr Rayann Heman who considered her stable for discharge to home.  Follow-up has been arranged with the Afib clinic in 1 week and with Dr Tamala Julian in 4 weeks.   Physical  Exam: Vitals:   07/05/17 1036 07/05/17 1045 07/05/17 2006 07/06/17 0414  BP: (!) 103/38 (!) 113/44 (!) 120/50 (!) 114/54  Pulse: (!) 55  (!) 59 61  Resp: 18 (!) 21 20 18   Temp:   (!) 97.4 F (36.3 C) 97.8 F (36.6 C)  TempSrc:   Oral Oral  SpO2: 96%  96% 96%  Weight:    199 lb 6.4 oz (90.4 kg)  Height:        GEN- The patient is well appearing, alert and oriented x 3 today.   HEENT: normocephalic, atraumatic; sclera clear, conjunctiva pink; hearing intact; oropharynx clear; neck supple, no JVP Lymph- no cervical lymphadenopathy Lungs-  CTA b/l, normal work of breathing.  No wheezes, rales, rhonchi Heart-  RRR, no murmurs, rubs or gallops, PMI not laterally displaced GI- soft, non-tender, non-distended Extremities- no clubbing, cyanosis, or edema MS- no significant deformity or atrophy Skin- warm and dry, no rash or lesion Psych- euthymic mood, full affect Neuro- strength and sensation are intact   Labs:   Lab Results  Component Value Date   WBC 9.0 07/05/2017   HGB 12.3 07/05/2017   HCT 37.5 07/05/2017   MCV 89.9 07/05/2017   PLT 284 07/05/2017    Recent Labs  Lab 07/06/17 0430  NA 136  K 4.3  CL 101  CO2 26  BUN 17  CREATININE 0.90  CALCIUM 8.7*  GLUCOSE 114*     Discharge Medications:  Allergies as of 07/06/2017      Reactions   Zanaflex [tizanidine] Other (See Comments)  HALLUCINATIONS   Percocet [oxycodone-acetaminophen] Other (See Comments)   Hallucination   Tramadol    Hallucinations.   Sulfa Antibiotics Itching   Vaginal itching   Sulfacetamide Sodium Itching   Vaginal itching      Medication List    STOP taking these medications   diltiazem 180 MG 24 hr capsule Commonly known as:  CARDIZEM CD     TAKE these medications   acetaminophen 500 MG tablet Commonly known as:  TYLENOL Take 500 mg by mouth daily as needed for headache.   aspirin EC 81 MG tablet Take 81 mg by mouth every Monday, Wednesday, and Friday.   clonazePAM 0.5 MG  tablet Commonly known as:  KLONOPIN TAKE 1/2 TO 1 TABLET TWICE DAILY AS NEEDED What changed:  See the new instructions.   CYANOCOBALAMIN IJ Take one (1) injection as directed every other month.   ELIQUIS 5 MG Tabs tablet Generic drug:  apixaban TAKE ONE TABLET TWICE DAILY   FLUoxetine 20 MG capsule Commonly known as:  PROZAC Take 10 mg by mouth every morning.   furosemide 20 MG tablet Commonly known as:  LASIX Take 20 mg by mouth as needed for fluid or edema.   hydrocortisone 20 MG tablet Commonly known as:  CORTEF Take 20 mg by mouth each morning and 10 mg by mouth each evening.   hyoscyamine 0.125 MG SL tablet Commonly known as:  LEVSIN SL Place 0.125 mg under the tongue daily as needed for cramping (IBS symptoms).   levothyroxine 88 MCG tablet Commonly known as:  SYNTHROID, LEVOTHROID Take 1 tablet (88 mcg total) by mouth daily before breakfast.   nitroGLYCERIN 0.4 MG SL tablet Commonly known as:  NITROSTAT Place 0.4 mg under the tongue every 5 (five) minutes as needed for chest pain (3 DOSES MAX FOR CHEST PAIN).   omeprazole 20 MG capsule Commonly known as:  PRILOSEC Take 20 mg by mouth every morning.   potassium chloride 10 MEQ tablet Commonly known as:  K-DUR Take 2 tablets (20 mEq total) by mouth daily.   PROBIOTIC FORMULA PO Take 1 capsule by mouth every morning. For upset stomach   sotalol 80 MG tablet Commonly known as:  BETAPACE Take 1 tablet (80 mg total) by mouth every 12 (twelve) hours.   valsartan 320 MG tablet Commonly known as:  DIOVAN 1 tablet daily.   Vitamin D (Ergocalciferol) 50000 units Caps capsule Commonly known as:  DRISDOL Take 50,000 Units by mouth 2 (two) times a week. Takes on Tuesdays and Fridays       Disposition:  Home   Follow-up Information    [facility] Follow up on 07/13/2017.   Specialty:  Cardiology Why:  10:00AM Contact information: [address] [id number] [name]       Belva Crome, MD Follow up on 08/09/2017.   Specialty:  Cardiology Why:  11:40AM Contact information: [address] [place] [address] [phone]           Duration of Discharge Encounter: Greater than 30 minutes including physician time.  Angelena Form PA-C 07/06/2017 9:29 AM  07/06/2017 9:29 AM

## 2017-07-05 NOTE — H&P (Signed)
   INTERVAL PROCEDURE H&P  History and Physical Interval Note:  07/05/2017 10:03 AM  [name] has presented today for their planned procedure. The various methods of treatment have been discussed with the patient and family. After consideration of risks, benefits and other options for treatment, the patient has consented to the procedure.  The patients' outpatient history has been reviewed, patient examined, and no change in status from most recent office note within the past 30 days. I have reviewed the patients' chart and labs and will proceed as planned. Questions were answered to the patient's satisfaction.   Pixie Casino, MD, [facility], [place] Director of the Advanced Lipid Disorders &  Cardiovascular Risk Reduction Clinic Diplomate of the American Board of Clinical Lipidology Attending Cardiologist  Direct Dial: [phone]  Fax: [phone]  Website:  [email or web address] 07/05/2017, 10:03 AM

## 2017-07-05 NOTE — Discharge Instructions (Addendum)
You have an appointment set up with the Atrial Fibrillation Clinic.  Multiple studies have shown that being followed by a dedicated atrial fibrillation clinic in addition to the standard care you receive from your other physicians improves health. We believe that enrollment in the atrial fibrillation clinic will allow us to better care for you.  ° °The phone number to the Atrial Fibrillation Clinic is [phone]. The clinic is staffed Monday through Friday from 8:30am to 5pm. ° °Parking Directions: The clinic is located in the Heart and Vascular Building connected to [facility]. °1)From [address] turn on to [address] and go to the 3rd entrance  (Heart and Vascular entrance) on the right. °2)Look to the right for Heart &Vascular Parking Garage. °3)A code for the entrance is required please call the clinic to receive this.   °4)Take the elevators to the 1st floor. Registration is in the room with the glass walls at the end of the hallway. ° °If you have any trouble parking or locating the clinic, please don’t hesitate to call [phone]. °Information on my medicine - ELIQUIS® (apixaban) ° °Why was Eliquis® prescribed for you? °Eliquis® was prescribed for you to reduce the risk of a blood clot forming that can cause a stroke if you have a medical condition called atrial fibrillation (a type of irregular heartbeat). ° °What do You need to know about Eliquis® ? °Take your Eliquis® TWICE DAILY - one tablet in the morning and one tablet in the evening with or without food. If you have difficulty swallowing the tablet whole please discuss with your pharmacist how to take the medication safely. ° °Take Eliquis® exactly as prescribed by your doctor and DO NOT stop taking Eliquis® without talking to the doctor who prescribed the medication.  Stopping may increase your risk of developing a stroke.  Refill your prescription before you run out. ° °After discharge, you should have regular check-up  appointments with your healthcare provider that is prescribing your Eliquis®.  In the future your dose may need to be changed if your kidney function or weight changes by a significant amount or as you get older. ° °What do you do if you miss a dose? °If you miss a dose, take it as soon as you remember on the same day and resume taking twice daily.  Do not take more than one dose of ELIQUIS at the same time to make up a missed dose. ° °Important Safety Information °A possible side effect of Eliquis® is bleeding. You should call your healthcare provider right away if you experience any of the following: °? Bleeding from an injury or your nose that does not stop. °? Unusual colored urine (red or dark brown) or unusual colored stools (red or black). °? Unusual bruising for unknown reasons. °? A serious fall or if you hit your head (even if there is no bleeding). ° °Some medicines may interact with Eliquis® and might increase your risk of bleeding or clotting while on Eliquis®. To help avoid this, consult your healthcare provider or pharmacist prior to using any new prescription or non-prescription medications, including herbals, vitamins, non-steroidal anti-inflammatory drugs (NSAIDs) and supplements. ° °This website has more information on Eliquis® (apixaban): http://www.eliquis.com/eliquis/home ° °

## 2017-07-05 NOTE — Transfer of Care (Signed)
Immediate Anesthesia Transfer of Care Note  Patient: [name]  Procedure(s) Performed: CARDIOVERSION (N/A )  Patient Location: Endoscopy Unit  Anesthesia Type:General  Level of Consciousness: awake, alert  and oriented  Airway & Oxygen Therapy: Patient Spontanous Breathing  Post-op Assessment: Report given to RN and Post -op Vital signs reviewed and stable  Post vital signs: Reviewed and stable  Last Vitals:  Vitals Value Taken Time  BP    Temp    Pulse    Resp    SpO2      Last Pain:  Vitals:   07/05/17 0954  TempSrc:   PainSc: 0-No pain         Complications: No apparent anesthesia complications

## 2017-07-05 NOTE — CV Procedure (Signed)
   CARDIOVERSION NOTE  Procedure: Electrical Cardioversion Indications:  Atrial Fibrillation  Procedure Details:  Consent: Risks of procedure as well as the alternatives and risks of each were explained to the (patient/caregiver).  Consent for procedure obtained.  Time Out: Verified patient identification, verified procedure, site/side was marked, verified correct patient position, special equipment/implants available, medications/allergies/relevent history reviewed, required imaging and test results available.  Performed  Patient placed on cardiac monitor, pulse oximetry, supplemental oxygen as necessary.  Sedation given: Propofol per anesthesia Pacer pads placed anterior and posterior chest.  Cardioverted 2 time(s).  Cardioverted at 150J and 200J biphasic.  Impression: Findings: Post procedure EKG shows: NSR Complications: None Patient did tolerate procedure well.  Plan: 1. Successful DCCV after 2 shocks (150J and 200J biphasic). 2. Ongoing management per cardiac EP service.  Time Spent Directly with the Patient:  30 minutes   Pixie Casino, MD, [facility], [place] Director of the Advanced Lipid Disorders &  Cardiovascular Risk Reduction Clinic Diplomate of the American Board of Clinical Lipidology Attending Cardiologist  Direct Dial: [phone]  Fax: [phone]  Website:  [email or web address] 07/05/2017, 10:18 AM

## 2017-07-05 NOTE — Anesthesia Postprocedure Evaluation (Signed)
Anesthesia Post Note  Patient: [name]  Procedure(s) Performed: CARDIOVERSION (N/A )     Patient location during evaluation: PACU Anesthesia Type: General Level of consciousness: awake and alert Pain management: pain level controlled Vital Signs Assessment: post-procedure vital signs reviewed and stable Respiratory status: spontaneous breathing, nonlabored ventilation and respiratory function stable Cardiovascular status: blood pressure returned to baseline and stable Postop Assessment: no apparent nausea or vomiting Anesthetic complications: no    Last Vitals:  Vitals:   07/05/17 1036 07/05/17 1045  BP: (!) 103/38 (!) 113/44  Pulse: (!) 55   Resp: 18 (!) 21  Temp:    SpO2: 96%     Last Pain:  Vitals:   07/05/17 1036  TempSrc:   PainSc: 0-No pain                 Audry Pili

## 2017-07-06 ENCOUNTER — Telehealth (HOSPITAL_COMMUNITY): Payer: Self-pay | Admitting: *Deleted

## 2017-07-06 ENCOUNTER — Encounter (HOSPITAL_COMMUNITY): Payer: Self-pay | Admitting: Internal Medicine

## 2017-07-06 ENCOUNTER — Other Ambulatory Visit: Payer: Self-pay

## 2017-07-06 LAB — BASIC METABOLIC PANEL
Anion gap: 9 (ref 5–15)
BUN: 17 mg/dL (ref 6–20)
CHLORIDE: 101 mmol/L (ref 101–111)
CO2: 26 mmol/L (ref 22–32)
Calcium: 8.7 mg/dL — ABNORMAL LOW (ref 8.9–10.3)
Creatinine, Ser: 0.9 mg/dL (ref 0.44–1.00)
GFR calc Af Amer: >60 mL/min (ref >60)
GFR calc non Af Amer: 60 mL/min — ABNORMAL LOW (ref >60)
Glucose, Bld: 114 mg/dL — ABNORMAL HIGH (ref 65–99)
Potassium: 4.3 mmol/L (ref 3.5–5.1)
SODIUM: 136 mmol/L (ref 135–145)

## 2017-07-06 LAB — MAGNESIUM: MAGNESIUM: 2 mg/dL (ref 1.7–2.4)

## 2017-07-06 MED ORDER — HYDROCORTISONE 1 % EX CREA
1.0000 "application " | TOPICAL_CREAM | Freq: Three times a day (TID) | CUTANEOUS | Status: DC | PRN
  Administered 2017-07-06: 1 via TOPICAL
  Filled 2017-07-06: qty 28

## 2017-07-06 MED ORDER — SOTALOL HCL 80 MG PO TABS: 80.0000 mg | ORAL_TABLET | Freq: Two times a day (BID) | ORAL | 11 refills | Status: DC

## 2017-07-06 MED ORDER — POTASSIUM CHLORIDE ER 10 MEQ PO TBCR: 20.0000 meq | EXTENDED_RELEASE_TABLET | Freq: Every day | ORAL | 3 refills | Status: DC

## 2017-07-06 NOTE — Progress Notes (Addendum)
Progress Note  Patient Name: LETTA Scott Date of Encounter: 07/06/2017  Primary Cardiologist: Sinclair Grooms, MD   Subjective   Up without problems  Inpatient Medications    Scheduled Meds: . apixaban  5 mg Oral BID  . aspirin EC  81 mg Oral Q M,W,F  . FLUoxetine  10 mg Oral q morning - 10a  . hydrocortisone  10 mg Oral QPM  . hydrocortisone  20 mg Oral Daily  . irbesartan  300 mg Oral QPM  . levothyroxine  88 mcg Oral QAC breakfast  . pantoprazole  40 mg Oral Daily  . potassium chloride  20 mEq Oral Daily  . sodium chloride flush  3 mL Intravenous Q12H  . sotalol  80 mg Oral Q12H   Continuous Infusions: . sodium chloride     PRN Meds: acetaminophen, clonazepam, hydrocortisone cream, hyoscyamine, nitroGLYCERIN, ondansetron (ZOFRAN) IV, sodium chloride flush   Vital Signs    Vitals:   07/05/17 1036 07/05/17 1045 07/05/17 2006 07/06/17 0414  BP: (!) 103/38 (!) 113/44 (!) 120/50 (!) 114/54  Pulse: (!) 55  (!) 59 61  Resp: 18 (!) 21 20 18   Temp:   (!) 97.4 F (36.3 C) 97.8 F (36.6 C)  TempSrc:   Oral Oral  SpO2: 96%  96% 96%  Weight:    199 lb 6.4 oz (90.4 kg)  Height:        Intake/Output Summary (Last 24 hours) at 07/06/2017 0917 Last data filed at 07/06/2017 0600 Gross per 24 hour  Intake 480 ml  Output 0 ml  Net 480 ml   Filed Weights   07/05/17 0355 07/05/17 0941 07/06/17 0414  Weight: 197 lb 4.8 oz (89.5 kg) 197 lb (89.4 kg) 199 lb 6.4 oz (90.4 kg)    Telemetry    NSR, occasional PAC - Personally Reviewed  ECG     NSR, atrial bigeminy, HR 56, QTc-469- Personally Reviewed  Physical Exam   GEN: No acute distress.   Neck: No JVD Cardiac: RRR, no murmurs, rubs, or gallops.  Respiratory: Clear to auscultation bilaterally. GI: Soft, nontender, non-distended  MS: No edema; No deformity. Neuro:  Nonfocal  Psych: Normal affect   Labs    Chemistry Recent Labs  Lab 07/04/17 0344 07/05/17 0349 07/06/17 0430  NA 138 139 136  K 4.6  4.7 4.3  CL 103 106 101  CO2 24 25 26   GLUCOSE 152* 141* 114*  BUN 17 20 17   CREATININE 0.97 0.85 0.90  CALCIUM 9.2 9.1 8.7*  GFRNONAA 55* >60 60*  GFRAA >60 >60 >60  ANIONGAP 11 8 9      Hematology Recent Labs  Lab 07/05/17 0349  WBC 9.0  RBC 4.17  HGB 12.3  HCT 37.5  MCV 89.9  MCH 29.5  MCHC 32.8  RDW 13.7  PLT 284    Cardiac EnzymesNo results for input(s): TROPONINI in the last 168 hours. No results for input(s): TROPIPOC in the last 168 hours.   BNPNo results for input(s): BNP, PROBNP in the last 168 hours.   DDimer No results for input(s): DDIMER in the last 168 hours.   Radiology    No results found.  Cardiac Studies     Patient Profile     77 y.o. female w/PMHx of CAD, chronic CHF (diastolic), HTN, hypothyroidism, and paroxysmal AFib, admitted for sotalol load and DCCV.   Assessment & Plan    1. Paroxysmal AFib     CHA2DS2Vasc is 5, on Eliquis,  appropriately dosed     K+ 4.3     Mag 2.0     Creat 0.90     QTc 469  2. CAD     No anginal complaints, continue home meds  3. HTN     No changes today  Plan: Discharge- f/u arranged.    For questions or updates, please contact [place] Please consult [email or web address] for contact info under Cardiology/STEMI.      Signed, Kerin Ransom, PA-C  07/06/2017, 9:17 AM    In sinus rhythm today.  No complaints.  Labs reviewed ekg today reveals stable qt PACs noted, no arrhythmias on telemetry  Follow-up in AF clinic in 1 week  Thompson Grayer MD, [facility] 07/06/2017 9:49 AM

## 2017-07-06 NOTE — Telephone Encounter (Signed)
Per insurance. No preauthorization required for sotalol. Tier 1 on formulary. Covered.

## 2017-07-09 ENCOUNTER — Telehealth (HOSPITAL_COMMUNITY): Payer: Self-pay | Admitting: *Deleted

## 2017-07-09 NOTE — Telephone Encounter (Signed)
Patient called in stating she is having "brain fog" in the mornings just no energy, lightheaded since starting sotalol. BP 109/65 prior to medications in the morning HR 68. Discussed with Roderic Palau NP will decrease avapro to 1/2 tablet at supper time. Increase water intake today. Pt verbalized understanding.

## 2017-07-13 ENCOUNTER — Ambulatory Visit (HOSPITAL_COMMUNITY)
Admit: 2017-07-13 | Discharge: 2017-07-13 | Disposition: A | Discharge status: Home / Self Care | Payer: Medicare Other | Attending: Nurse Practitioner | Admitting: Nurse Practitioner

## 2017-07-13 ENCOUNTER — Encounter (HOSPITAL_COMMUNITY): Payer: Self-pay | Admitting: Nurse Practitioner

## 2017-07-13 ENCOUNTER — Other Ambulatory Visit: Payer: Self-pay

## 2017-07-13 VITALS — BP 110/58 | HR 57 | Ht 64.5 in | Wt 201.2 lb

## 2017-07-13 DIAGNOSIS — I5031 Acute diastolic (congestive) heart failure: Secondary | ICD-10-CM | POA: Insufficient documentation

## 2017-07-13 DIAGNOSIS — Z7982 Long term (current) use of aspirin: Secondary | ICD-10-CM | POA: Diagnosis not present

## 2017-07-13 DIAGNOSIS — Z885 Allergy status to narcotic agent status: Secondary | ICD-10-CM | POA: Insufficient documentation

## 2017-07-13 DIAGNOSIS — I4510 Unspecified right bundle-branch block: Secondary | ICD-10-CM | POA: Diagnosis not present

## 2017-07-13 DIAGNOSIS — F329 Major depressive disorder, single episode, unspecified: Secondary | ICD-10-CM | POA: Diagnosis not present

## 2017-07-13 DIAGNOSIS — F419 Anxiety disorder, unspecified: Secondary | ICD-10-CM | POA: Insufficient documentation

## 2017-07-13 DIAGNOSIS — Z79899 Other long term (current) drug therapy: Secondary | ICD-10-CM | POA: Insufficient documentation

## 2017-07-13 DIAGNOSIS — E039 Hypothyroidism, unspecified: Secondary | ICD-10-CM | POA: Insufficient documentation

## 2017-07-13 DIAGNOSIS — I110 Hypertensive heart disease with heart failure: Secondary | ICD-10-CM | POA: Insufficient documentation

## 2017-07-13 DIAGNOSIS — I480 Paroxysmal atrial fibrillation: Secondary | ICD-10-CM | POA: Diagnosis not present

## 2017-07-13 DIAGNOSIS — I2510 Atherosclerotic heart disease of native coronary artery without angina pectoris: Secondary | ICD-10-CM | POA: Diagnosis present

## 2017-07-13 DIAGNOSIS — Z7722 Contact with and (suspected) exposure to environmental tobacco smoke (acute) (chronic): Secondary | ICD-10-CM | POA: Insufficient documentation

## 2017-07-13 DIAGNOSIS — M5416 Radiculopathy, lumbar region: Secondary | ICD-10-CM | POA: Insufficient documentation

## 2017-07-13 DIAGNOSIS — Z7901 Long term (current) use of anticoagulants: Secondary | ICD-10-CM | POA: Insufficient documentation

## 2017-07-13 DIAGNOSIS — E785 Hyperlipidemia, unspecified: Secondary | ICD-10-CM | POA: Diagnosis not present

## 2017-07-13 DIAGNOSIS — R001 Bradycardia, unspecified: Secondary | ICD-10-CM | POA: Insufficient documentation

## 2017-07-13 DIAGNOSIS — Z882 Allergy status to sulfonamides status: Secondary | ICD-10-CM | POA: Insufficient documentation

## 2017-07-13 DIAGNOSIS — K219 Gastro-esophageal reflux disease without esophagitis: Secondary | ICD-10-CM | POA: Insufficient documentation

## 2017-07-13 LAB — BASIC METABOLIC PANEL
ANION GAP: 9 (ref 5–15)
BUN: 19 mg/dL (ref 6–20)
CALCIUM: 9.3 mg/dL (ref 8.9–10.3)
CO2: 26 mmol/L (ref 22–32)
Chloride: 105 mmol/L (ref 101–111)
Creatinine, Ser: 0.87 mg/dL (ref 0.44–1.00)
GLUCOSE: 147 mg/dL — AB (ref 65–99)
Potassium: 4.1 mmol/L (ref 3.5–5.1)
Sodium: 140 mmol/L (ref 135–145)

## 2017-07-13 LAB — MAGNESIUM: Magnesium: 1.9 mg/dL (ref 1.7–2.4)

## 2017-07-13 MED ORDER — SOTALOL HCL 80 MG PO TABS: 40.0000 mg | ORAL_TABLET | Freq: Two times a day (BID) | ORAL | 11 refills | Status: DC

## 2017-07-13 NOTE — Patient Instructions (Signed)
Decrease sotalol to 40mg  twice a day

## 2017-07-13 NOTE — Progress Notes (Signed)
Primary Care Physician: Crist Infante, MD Referring Physician: Dr. Jannet Askew is a 77 y.o. female with a h/o coronary artery disease with prior bypass grafting, atrial fibrillation resulting in acute diastolic heart failure, hypertension, chronic anticoagulation therapy with Eliquisand hyperlipidemia.Was on amiodarone which was subsequently discontinued  In 2017,2/2 to intolerance, but pt can not remember in which  way she did not tolerate.  She was found to be in  afib 3/6 when seen by [name] and subsequently had successful cardioversion 3/7. She was seen in the afib clinic, 3/11, at Dr. Thompson Caul suggestion to get pt on sotalol, as she is very symptomatic in afib.[name]  She was in SR and feels improved. She is also on eliquis 5 mg bid and has not had any missed doses. She had a long qt around 500 ms and discussed with Dr. Rayann Heman who suggested that pt contact PCP to see if Prozac dose  could be cut in half, to minimize qt to get sotalol on board. Pt did this and qt did shorten.  Pt is in clinic, 4/2, and is ready to come into hospital for sotalol load. She started feeling lightheaded yesterday and is found to be in  afib with v rate at 117 bpm. No missed doses of eliquis 5 mg bid. No benadryl use.  F/u sotalol load 4/12. Pt is staying in rhythm but states " I can not live like this" and wants to stop drug. She has felt draggy, lightheaded, no energy since she started drug. We discussed cutting the dose in half instead of stopping drug to see if she can still maintain SR and she is willing to do this.   Today, she denies symptoms of  chest pain, shortness of breath, orthopnea, PND, lower extremity edema, dizziness, presyncope, syncope, or neurologic sequela.+  Fatigue/lightheadedness.  Past Medical History:  Diagnosis Date  . Addison's disease ([place])   . Angina   . Anxiety   . Arthritis    "everywhere"  . Atrial fibrillation ([place])   . Chronic lower back pain   . Complication of  anesthesia    addison's disease causes hypotension after any surgery .  last knee surgery dr. Elmyra Ricks gave large dose of hydrocortisal and avoided the hypotensive side effectas of addison's disease.  . Coronary artery disease   . Depression   . Dysrhythmia   . GERD (gastroesophageal reflux disease)   . Hyperlipemia   . Hypertension   . Hypothyroidism   . Lumbar radiculopathy 04/27/2014  . PONV (postoperative nausea and vomiting)   . Recurrent upper respiratory infection (URI)   . Shortness of breath    Past Surgical History:  Procedure Laterality Date  . ACHILLES TENDON SURGERY  07/16/14   left ankle, bone spur removed  . ANTERIOR LUMBAR FUSION  1980   L4-5  . BACK SURGERY    . CARDIAC CATHETERIZATION  2013  . CARDIAC CATHETERIZATION N/A 09/02/2014   Procedure: Left Heart Cath and Coronary Angiography;  Surgeon: Wellington Hampshire, MD;  Location: [facility];  Service: Cardiovascular;  Laterality: N/A;  . CARDIOVERSION N/A 10/22/2015   Procedure: CARDIOVERSION;  Surgeon: Sanda Klein, MD;  Location: [facility];  Service: Cardiovascular;  Laterality: N/A;  . CARDIOVERSION N/A 06/07/2017   Procedure: CARDIOVERSION;  Surgeon: Larey Dresser, MD;  Location: [facility]OSCOPY;  Service: Cardiovascular;  Laterality: N/A;  . CARDIOVERSION N/A 07/05/2017   Procedure: CARDIOVERSION;  Surgeon: Pixie Casino, MD;  Location: [facility];  Service: Cardiovascular;  Laterality: N/A;  . CARPAL TUNNEL RELEASE Right   . CATARACT EXTRACTION, BILATERAL Bilateral   . CORONARY ARTERY BYPASS GRAFT  06/26/2011   Procedure: CORONARY ARTERY BYPASS GRAFTING (CABG);  Surgeon: Melrose Nakayama, MD;  Location: [facility];  Service: Open Heart Surgery;  Laterality: N/A;  times using Greater Saphenous Vein Graft harvested endoscopically from right leg  . DIAGNOSTIC LAPAROSCOPY    . DILATION AND CURETTAGE OF UTERUS    . JOINT REPLACEMENT    . KNEE ARTHROSCOPY Bilateral   . LAPAROSCOPIC CHOLECYSTECTOMY    .  Nerve stimulator of back  12/10/15  . REVISION TOTAL KNEE ARTHROPLASTY Right   . TONSILLECTOMY    . TOTAL KNEE ARTHROPLASTY Bilateral     Current Outpatient Medications  Medication Sig Dispense Refill  . acetaminophen (TYLENOL) 500 MG tablet Take 500 mg by mouth daily as needed for headache.    [name] aspirin EC 81 MG tablet Take 81 mg by mouth every Monday, Wednesday, and Friday.    . clonazePAM (KLONOPIN) 0.5 MG tablet TAKE 1/2 TO 1 TABLET TWICE DAILY AS NEEDED (Patient taking differently: TAKE 1/2 TO 1 TABLET TWICE DAILY AS NEEDED FOR ANXIETY) 60 tablet 5  . CYANOCOBALAMIN IJ Take one (1) injection as directed every other month.    Arne Cleveland 5 MG TABS tablet TAKE ONE TABLET TWICE DAILY 60 tablet 5  . FLUoxetine (PROZAC) 20 MG capsule Take 10 mg by mouth every morning.     . furosemide (LASIX) 20 MG tablet Take 20 mg by mouth as needed for fluid or edema.    . hydrocortisone (CORTEF) 20 MG tablet Take 20 mg by mouth each morning and 10 mg by mouth each evening.    . hyoscyamine (LEVSIN SL) 0.125 MG SL tablet Place 0.125 mg under the tongue daily as needed for cramping (IBS symptoms).    [name] levothyroxine (SYNTHROID, LEVOTHROID) 88 MCG tablet Take 1 tablet (88 mcg total) by mouth daily before breakfast. 30 tablet 0  . nitroGLYCERIN (NITROSTAT) 0.4 MG SL tablet Place 0.4 mg under the tongue every 5 (five) minutes as needed for chest pain (3 DOSES MAX FOR CHEST PAIN).    [name] omeprazole (PRILOSEC) 20 MG capsule Take 20 mg by mouth every morning.     . potassium chloride (K-DUR) 10 MEQ tablet Take 2 tablets (20 mEq total) by mouth daily. 60 tablet 3  . Probiotic Product (PROBIOTIC FORMULA PO) Take 1 capsule by mouth every morning. For upset stomach    . sotalol (BETAPACE) 80 MG tablet Take 0.5 tablets (40 mg total) by mouth every 12 (twelve) hours. 60 tablet 11  . valsartan (DIOVAN) 320 MG tablet 0.5 tablets daily.    . Vitamin D, Ergocalciferol, (DRISDOL) 50000 UNITS CAPS Take 50,000 Units by mouth 2  (two) times a week. Takes on Tuesdays and Fridays     No current facility-administered medications for this encounter.     Allergies  Allergen Reactions  . Zanaflex [Tizanidine] Other (See Comments)    HALLUCINATIONS  . Percocet [Oxycodone-Acetaminophen] Other (See Comments)    Hallucination  . Tramadol     Hallucinations.  . Sulfa Antibiotics Itching    Vaginal itching  . Sulfacetamide Sodium Itching    Vaginal itching    Social History   Socioeconomic History  . Marital status: Widowed    Spouse name: Not on file  . Number of children: Not on file  . Years of education: Not on file  .  Highest education level: Not on file  Occupational History  . Occupation: Retired  Scientific laboratory technician  . Financial resource strain: Not on file  . Food insecurity:    Worry: Not on file    Inability: Not on file  . Transportation needs:    Medical: Not on file    Non-medical: Not on file  Tobacco Use  . Smoking status: Passive Smoke Exposure - Never Smoker  . Smokeless tobacco: Never Used  Substance and Sexual Activity  . Alcohol use: No    Alcohol/week: 0.0 oz  . Drug use: No  . Sexual activity: Never    Birth control/protection: Post-menopausal  Lifestyle  . Physical activity:    Days per week: Not on file    Minutes per session: Not on file  . Stress: Not on file  Relationships  . Social connections:    Talks on phone: Not on file    Gets together: Not on file    Attends religious service: Not on file    Active member of club or organization: Not on file    Attends meetings of clubs or organizations: Not on file    Relationship status: Not on file  . Intimate partner violence:    Fear of current or ex partner: Not on file    Emotionally abused: Not on file    Physically abused: Not on file    Forced sexual activity: Not on file  Other Topics Concern  . Not on file  Social History Narrative  . Not on file    Family History  Problem Relation Age of Onset  . Heart  attack Father   . Hypertension Mother   . Stroke Mother   . Hypertension Maternal Grandfather   . Colon cancer Neg Hx     ROS- All systems are reviewed and negative except as per the HPI above  Physical Exam: Vitals:   07/13/17 0957  BP: (!) 110/58  Pulse: (!) 57  Weight: 201 lb 3.2 oz (91.3 kg)  Height: 5' 4.5" (1.638 m)   Wt Readings from Last 3 Encounters:  07/13/17 201 lb 3.2 oz (91.3 kg)  07/06/17 199 lb 6.4 oz (90.4 kg)  07/03/17 198 lb 12.8 oz (90.2 kg)    Labs: Lab Results  Component Value Date   NA 136 07/06/2017   K 4.3 07/06/2017   CL 101 07/06/2017   CO2 26 07/06/2017   GLUCOSE 114 (H) 07/06/2017   BUN 17 07/06/2017   CREATININE 0.90 07/06/2017   CALCIUM 8.7 (L) 07/06/2017   MG 2.0 07/06/2017   Lab Results  Component Value Date   INR 1.1 06/06/2017   Lab Results  Component Value Date   CHOL 100 09/02/2014   HDL 40 (L) 09/02/2014   LDLCALC 44 09/02/2014   TRIG 78 09/02/2014     GEN- The patient is well appearing, alert and oriented x 3 today.   Head- normocephalic, atraumatic Eyes-  Sclera clear, conjunctiva pink Ears- hearing intact Oropharynx- clear Neck- supple, no JVP Lymph- no cervical lymphadenopathy Lungs- Clear to ausculation bilaterally, normal work of breathing Heart-regular rate and rhythm, no murmurs, rubs or gallops, PMI not laterally displaced GI- soft, NT, ND, + BS Extremities- no clubbing, cyanosis, or edema MS- no significant deformity or atrophy Skin- no rash or lesion Psych- euthymic mood, full affect Neuro- strength and sensation are intact  EKG- Sinus brady at 57 bpm with PAC's EPIC records reviewed    Assessment and Plan: 1. Paroxysmal afib  Maintaining SR S/p sotalol loading but pt states she is not tolerating due to extreme fatigue She wants to stop drug but will first try to decrease dose to 40 mg bid to see if she can tolerate better and still mange afib IF not options are tikosyn or ablation Remembers now  that she did not try amiodarone in the past herself but it " nearly killed my husband" and she does not want to try Continue eliquis 5 mg bid Bmet/mag today  F/u in one week   [name] C. Abdiel Blackerby, [facility] [address] [place], [place] [address] [phone]

## 2017-07-16 ENCOUNTER — Telehealth (HOSPITAL_COMMUNITY): Payer: Self-pay | Admitting: *Deleted

## 2017-07-16 MED ORDER — DILTIAZEM HCL ER COATED BEADS 180 MG PO CP24: 180.0000 mg | ORAL_CAPSULE | Freq: Every day | ORAL | Status: DC

## 2017-07-16 NOTE — Telephone Encounter (Signed)
Pt called in stating since decreasing the sotalol to 40mg  BID she continues to feel lousy and it lasts until 3pm just about everyday. Her HR has been elevated since decreasing the sotalol dose as well. Discussed with Roderic Palau Np will stop sotalol and resume diltiazem 180mg  once a day. Pt in agreement.

## 2017-07-17 ENCOUNTER — Ambulatory Visit (HOSPITAL_COMMUNITY): Payer: Medicare Other | Admitting: Nurse Practitioner

## 2017-07-19 ENCOUNTER — Ambulatory Visit (HOSPITAL_COMMUNITY)
Admission: RE | Admit: 2017-07-19 | Discharge: 2017-07-19 | Disposition: A | Discharge status: Home / Self Care | Payer: Medicare Other | Source: Ambulatory Visit | Attending: Nurse Practitioner | Admitting: Nurse Practitioner

## 2017-07-19 ENCOUNTER — Telehealth: Payer: Self-pay | Admitting: Pharmacist

## 2017-07-19 ENCOUNTER — Encounter (HOSPITAL_COMMUNITY): Payer: Self-pay | Admitting: Nurse Practitioner

## 2017-07-19 VITALS — BP 136/68 | HR 106 | Ht 64.5 in | Wt 201.0 lb

## 2017-07-19 DIAGNOSIS — R55 Syncope and collapse: Secondary | ICD-10-CM | POA: Insufficient documentation

## 2017-07-19 DIAGNOSIS — I2510 Atherosclerotic heart disease of native coronary artery without angina pectoris: Secondary | ICD-10-CM | POA: Diagnosis present

## 2017-07-19 DIAGNOSIS — Z951 Presence of aortocoronary bypass graft: Secondary | ICD-10-CM | POA: Insufficient documentation

## 2017-07-19 DIAGNOSIS — I5031 Acute diastolic (congestive) heart failure: Secondary | ICD-10-CM | POA: Diagnosis not present

## 2017-07-19 DIAGNOSIS — Z7722 Contact with and (suspected) exposure to environmental tobacco smoke (acute) (chronic): Secondary | ICD-10-CM | POA: Diagnosis not present

## 2017-07-19 DIAGNOSIS — Z882 Allergy status to sulfonamides status: Secondary | ICD-10-CM | POA: Insufficient documentation

## 2017-07-19 DIAGNOSIS — I480 Paroxysmal atrial fibrillation: Secondary | ICD-10-CM

## 2017-07-19 DIAGNOSIS — Z79899 Other long term (current) drug therapy: Secondary | ICD-10-CM | POA: Diagnosis not present

## 2017-07-19 DIAGNOSIS — E039 Hypothyroidism, unspecified: Secondary | ICD-10-CM | POA: Insufficient documentation

## 2017-07-19 DIAGNOSIS — F419 Anxiety disorder, unspecified: Secondary | ICD-10-CM | POA: Diagnosis not present

## 2017-07-19 DIAGNOSIS — K219 Gastro-esophageal reflux disease without esophagitis: Secondary | ICD-10-CM | POA: Insufficient documentation

## 2017-07-19 DIAGNOSIS — I10 Essential (primary) hypertension: Secondary | ICD-10-CM | POA: Diagnosis not present

## 2017-07-19 DIAGNOSIS — Z885 Allergy status to narcotic agent status: Secondary | ICD-10-CM | POA: Diagnosis not present

## 2017-07-19 DIAGNOSIS — M5416 Radiculopathy, lumbar region: Secondary | ICD-10-CM | POA: Diagnosis not present

## 2017-07-19 DIAGNOSIS — Z7901 Long term (current) use of anticoagulants: Secondary | ICD-10-CM | POA: Diagnosis not present

## 2017-07-19 DIAGNOSIS — F329 Major depressive disorder, single episode, unspecified: Secondary | ICD-10-CM | POA: Insufficient documentation

## 2017-07-19 DIAGNOSIS — E785 Hyperlipidemia, unspecified: Secondary | ICD-10-CM | POA: Diagnosis not present

## 2017-07-19 DIAGNOSIS — Z7982 Long term (current) use of aspirin: Secondary | ICD-10-CM | POA: Diagnosis not present

## 2017-07-19 DIAGNOSIS — I110 Hypertensive heart disease with heart failure: Secondary | ICD-10-CM | POA: Insufficient documentation

## 2017-07-19 MED ORDER — DILTIAZEM HCL ER COATED BEADS 180 MG PO CP24: 180.0000 mg | ORAL_CAPSULE | Freq: Two times a day (BID) | ORAL | Status: DC

## 2017-07-19 NOTE — Patient Instructions (Signed)
Valsartan to 1/2 tablet daily  Increase cardizem to 180mg  twice a day

## 2017-07-19 NOTE — Telephone Encounter (Addendum)
Medication list reviewed in anticipation of upcoming Tikosyn initiation. Patient is taking fluoxetine which is QTc prolonging, however pt is taking a low dose at 10mg . Will need to monitor QTc closely - elevated >567msec on today's EKG however pt in afib so not likely an accurate calculation. QTc previously < 446msec. She is not taking any contraindicated medications.   Patient is anticoagulated on Eliquis 5mg  BID on the appropriate dose. Please ensure that patient has not missed any anticoagulation doses in the 3 weeks prior to Tikosyn initiation.   Patient will need to be counseled to avoid use of Benadryl while on Tikosyn and in the 2-3 days prior to Tikosyn initiation.

## 2017-07-19 NOTE — Progress Notes (Signed)
Primary Care Physician: Crist Infante, MD Referring Physician: Dr. Jannet Askew is a 77 y.o. female with a h/o coronary artery disease with prior bypass grafting, atrial fibrillation resulting in acute diastolic heart failure, hypertension, chronic anticoagulation therapy with Eliquisand hyperlipidemia.Was on amiodarone which was subsequently discontinued  In 2017,2/2 to intolerance, but pt can not remember in which  way she did not tolerate.  She was found to be in  afib 3/6 when seen by [name] and subsequently had successful cardioversion 3/7. She was seen in the afib clinic, 3/11, at Dr. Thompson Caul suggestion to get pt on sotalol, as she is very symptomatic in afib.[name]  She was in SR and feels improved. She is also on eliquis 5 mg bid and has not had any missed doses. She had a long qt around 500 ms and discussed with Dr. Rayann Heman who suggested that pt contact PCP to see if Prozac dose  could be cut in half, to minimize qt to get sotalol on board. Pt did this and qt did shorten.  Pt is in clinic, 4/2, and is ready to come into hospital for sotalol load. She started feeling lightheaded yesterday and is found to be in  afib with v rate at 117 bpm. No missed doses of eliquis 5 mg bid. No benadryl use.  F/u sotalol load 4/12. Pt is staying in rhythm but states " I can not live like this" and wants to stop drug. She has felt draggy, lightheaded, no energy since she started drug. We discussed cutting the dose in half instead of stopping drug to see if she can still maintain SR and she is willing to do this.  F/u 4/18. She called the office Monday and c/o of not being able to tolerate the sotalol, even at 40 mg bid, "still can't function , I have no energy." So she did not have sotalol on Tuesday,felt good, but started feeling poorly yesterday.Ekg shows afib at 106 bpm. She has checked on the price of tikosyn and with mail order can afford drug and is wanting to come into hospital next week for  Tikosyn load.    Today, she denies symptoms of  chest pain, shortness of breath, orthopnea, PND, lower extremity edema, dizziness, presyncope, syncope, or neurologic sequela.+  Fatigue/lightheadedness.  Past Medical History:  Diagnosis Date  . Addison's disease ([place])   . Angina   . Anxiety   . Arthritis    "everywhere"  . Atrial fibrillation ([place])   . Chronic lower back pain   . 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  last knee surgery dr. Elmyra Ricks gave large dose of hydrocortisal and avoided the hypotensive side effectas of addison's disease.  . Coronary artery disease   . Depression   . Dysrhythmia   . GERD (gastroesophageal reflux disease)   . Hyperlipemia   . Hypertension   . Hypothyroidism   . Lumbar radiculopathy 04/27/2014  . PONV (postoperative nausea and vomiting)   . Recurrent upper respiratory infection (URI)   . Shortness of breath    Past Surgical History:  Procedure Laterality Date  . ACHILLES TENDON SURGERY  07/16/14   left ankle, bone spur removed  . ANTERIOR LUMBAR FUSION  1980   L4-5  . BACK SURGERY    . CARDIAC CATHETERIZATION  2013  . CARDIAC CATHETERIZATION N/A 09/02/2014   Procedure: Left Heart Cath and Coronary Angiography;  Surgeon: Wellington Hampshire, MD;  Location:  [facility];  Service: Cardiovascular;  Laterality: N/A;  . CARDIOVERSION N/A 10/22/2015   Procedure: CARDIOVERSION;  Surgeon: Sanda Klein, MD;  Location: [facility] ENDOSCOPY;  Service: Cardiovascular;  Laterality: N/A;  . CARDIOVERSION N/A 06/07/2017   Procedure: CARDIOVERSION;  Surgeon: Larey Dresser, MD;  Location: [facility] ENDOSCOPY;  Service: Cardiovascular;  Laterality: N/A;  . CARDIOVERSION N/A 07/05/2017   Procedure: CARDIOVERSION;  Surgeon: Pixie Casino, MD;  Location: [facility];  Service: Cardiovascular;  Laterality: N/A;  . CARPAL TUNNEL RELEASE Right   . CATARACT EXTRACTION, BILATERAL Bilateral   . CORONARY ARTERY BYPASS GRAFT   06/26/2011   Procedure: CORONARY ARTERY BYPASS GRAFTING (CABG);  Surgeon: Melrose Nakayama, MD;  Location: [facility];  Service: Open Heart Surgery;  Laterality: N/A;  times using Greater Saphenous Vein Graft harvested endoscopically from right leg  . DIAGNOSTIC LAPAROSCOPY    . DILATION AND CURETTAGE OF UTERUS    . JOINT REPLACEMENT    . KNEE ARTHROSCOPY Bilateral   . LAPAROSCOPIC CHOLECYSTECTOMY    . Nerve stimulator of back  12/10/15  . REVISION TOTAL KNEE ARTHROPLASTY Right   . TONSILLECTOMY    . TOTAL KNEE ARTHROPLASTY Bilateral     Current Outpatient Medications  Medication Sig Dispense Refill  . acetaminophen (TYLENOL) 500 MG tablet Take 500 mg by mouth daily as needed for headache.    [name] aspirin EC 81 MG tablet Take 81 mg by mouth every Monday, Wednesday, and Friday.    . clonazePAM (KLONOPIN) 0.5 MG tablet TAKE 1/2 TO 1 TABLET TWICE DAILY AS NEEDED (Patient taking differently: TAKE 1/2 TO 1 TABLET TWICE DAILY AS NEEDED FOR ANXIETY) 60 tablet 5  . CYANOCOBALAMIN IJ Take one (1) injection as directed every other month.    . diltiazem (CARDIZEM CD) 180 MG 24 hr capsule Take 1 capsule (180 mg total) by mouth 2 (two) times daily.    [name] ELIQUIS 5 MG TABS tablet TAKE ONE TABLET TWICE DAILY 60 tablet 5  . FLUoxetine (PROZAC) 20 MG capsule Take 10 mg by mouth every morning.     . furosemide (LASIX) 20 MG tablet Take 20 mg by mouth as needed for fluid or edema.    . hydrocortisone (CORTEF) 20 MG tablet Take 20 mg by mouth each morning and 10 mg by mouth each evening.    . hyoscyamine (LEVSIN SL) 0.125 MG SL tablet Place 0.125 mg under the tongue daily as needed for cramping (IBS symptoms).    [name] levothyroxine (SYNTHROID, LEVOTHROID) 88 MCG tablet Take 1 tablet (88 mcg total) by mouth daily before breakfast. 30 tablet 0  . nitroGLYCERIN (NITROSTAT) 0.4 MG SL tablet Place 0.4 mg under the tongue every 5 (five) minutes as needed for chest pain (3 DOSES MAX FOR CHEST PAIN).    [name] omeprazole (PRILOSEC)  20 MG capsule Take 20 mg by mouth every morning.     . potassium chloride (K-DUR) 10 MEQ tablet Take 2 tablets (20 mEq total) by mouth daily. 60 tablet 3  . Probiotic Product (PROBIOTIC FORMULA PO) Take 1 capsule by mouth every morning. For upset stomach    . valsartan (DIOVAN) 320 MG tablet 0.5 tablets daily.    . Vitamin D, Ergocalciferol, (DRISDOL) 50000 UNITS CAPS Take 50,000 Units by mouth 2 (two) times a week. Takes on Tuesdays and Fridays     No current facility-administered medications for this encounter.     Allergies  Allergen Reactions  . Zanaflex [Tizanidine] Other (See Comments)  HALLUCINATIONS  . Percocet [Oxycodone-Acetaminophen] Other (See Comments)    Hallucination  . Tramadol     Hallucinations.  . Sulfa Antibiotics Itching    Vaginal itching  . Sulfacetamide Sodium Itching    Vaginal itching    Social History   Socioeconomic History  . Marital status: Widowed    Spouse name: Not on file  . Number of children: Not on file  . Years of education: Not on file  . Highest education level: Not on file  Occupational History  . Occupation: Retired  Scientific laboratory technician  . Financial resource strain: Not on file  . Food insecurity:    Worry: Not on file    Inability: Not on file  . Transportation needs:    Medical: Not on file    Non-medical: Not on file  Tobacco Use  . Smoking status: Passive Smoke Exposure - Never Smoker  . Smokeless tobacco: Never Used  Substance and Sexual Activity  . Alcohol use: No    Alcohol/week: 0.0 oz  . Drug use: No  . Sexual activity: Never    Birth control/protection: Post-menopausal  Lifestyle  . Physical activity:    Days per week: Not on file    Minutes per session: Not on file  . Stress: Not on file  Relationships  . Social connections:    Talks on phone: Not on file    Gets together: Not on file    Attends religious service: Not on file    Active member of club or organization: Not on file    Attends meetings of clubs  or organizations: Not on file    Relationship status: Not on file  . Intimate partner violence:    Fear of current or ex partner: Not on file    Emotionally abused: Not on file    Physically abused: Not on file    Forced sexual activity: Not on file  Other Topics Concern  . Not on file  Social History Narrative  . Not on file    Family History  Problem Relation Age of Onset  . Heart attack Father   . Hypertension Mother   . Stroke Mother   . Hypertension Maternal Grandfather   . Colon cancer Neg Hx     ROS- All systems are reviewed and negative except as per the HPI above  Physical Exam: Vitals:   07/19/17 1428  BP: 136/68  Pulse: (!) 106  SpO2: 96%  Weight: 201 lb (91.2 kg)  Height: 5' 4.5" (1.638 m)   Wt Readings from Last 3 Encounters:  07/19/17 201 lb (91.2 kg)  07/13/17 201 lb 3.2 oz (91.3 kg)  07/06/17 199 lb 6.4 oz (90.4 kg)    Labs: Lab Results  Component Value Date   NA 140 07/13/2017   K 4.1 07/13/2017   CL 105 07/13/2017   CO2 26 07/13/2017   GLUCOSE 147 (H) 07/13/2017   BUN 19 07/13/2017   CREATININE 0.87 07/13/2017   CALCIUM 9.3 07/13/2017   MG 1.9 07/13/2017   Lab Results  Component Value Date   INR 1.1 06/06/2017   Lab Results  Component Value Date   CHOL 100 09/02/2014   HDL 40 (L) 09/02/2014   LDLCALC 44 09/02/2014   TRIG 78 09/02/2014     GEN- The patient is well appearing, alert and oriented x 3 today.   Head- normocephalic, atraumatic Eyes-  Sclera clear, conjunctiva pink Ears- hearing intact Oropharynx- clear Neck- supple, no JVP Lymph- no cervical lymphadenopathy  Lungs- Clear to ausculation bilaterally, normal work of breathing Heart-irregular rate and rhythm, no murmurs, rubs or gallops, PMI not laterally displaced GI- soft, NT, ND, + BS Extremities- no clubbing, cyanosis, or edema MS- no significant deformity or atrophy Skin- no rash or lesion Psych- euthymic mood, full affect Neuro- strength and sensation are  intact  EKG- afib at 106 bpm, qrs int 94 ms, qtc 510 ms(Qtc in SR on sotalol was in range) EPIC records reviewed    Assessment and Plan: 1. Paroxysmal afib Maintained SR on sotalol but was very symptomatic with fatigue, presyncope Even with dose of 40 mg bid, pt continued to be symptomatic She wanted to stop drug, off since Monday, felt better x one day until afib returned She wants to come back in next Monday for tikosyn load Remembers now that she did not try amiodarone in the past herself but it " nearly killed my husband" and she did not want to try Continue eliquis 5 mg bid,states no known doses Will increase diltiazem to 180 mg bid for better rate control Cut valsartan in half to prevent hypotension If worsens over the weekend to the ER    [name] C. Kassadie Pancake, [facility] [address] [place], [place] [address] [phone]

## 2017-07-23 ENCOUNTER — Encounter (HOSPITAL_COMMUNITY): Payer: Self-pay | Admitting: Nurse Practitioner

## 2017-07-23 ENCOUNTER — Inpatient Hospital Stay (HOSPITAL_COMMUNITY)
Admission: RE | Admit: 2017-07-23 | Discharge: 2017-07-27 | DRG: 309 | Disposition: A | Discharge status: Home / Self Care | Payer: Medicare Other | Source: Ambulatory Visit | Attending: Internal Medicine | Admitting: Internal Medicine

## 2017-07-23 ENCOUNTER — Encounter (HOSPITAL_COMMUNITY): Payer: Self-pay | Admitting: General Practice

## 2017-07-23 ENCOUNTER — Ambulatory Visit (HOSPITAL_COMMUNITY)
Admission: RE | Admit: 2017-07-23 | Discharge: 2017-07-23 | Disposition: A | Discharge status: Home / Self Care | Payer: Medicare Other | Source: Ambulatory Visit | Attending: Nurse Practitioner | Admitting: Nurse Practitioner

## 2017-07-23 ENCOUNTER — Other Ambulatory Visit: Payer: Self-pay

## 2017-07-23 VITALS — BP 116/64 | HR 82 | Ht 64.5 in | Wt 201.0 lb

## 2017-07-23 DIAGNOSIS — Z8249 Family history of ischemic heart disease and other diseases of the circulatory system: Secondary | ICD-10-CM

## 2017-07-23 DIAGNOSIS — I480 Paroxysmal atrial fibrillation: Secondary | ICD-10-CM

## 2017-07-23 DIAGNOSIS — Z885 Allergy status to narcotic agent status: Secondary | ICD-10-CM

## 2017-07-23 DIAGNOSIS — Z6833 Body mass index (BMI) 33.0-33.9, adult: Secondary | ICD-10-CM

## 2017-07-23 DIAGNOSIS — Z79890 Hormone replacement therapy (postmenopausal): Secondary | ICD-10-CM

## 2017-07-23 DIAGNOSIS — E039 Hypothyroidism, unspecified: Secondary | ICD-10-CM | POA: Diagnosis present

## 2017-07-23 DIAGNOSIS — Z7901 Long term (current) use of anticoagulants: Secondary | ICD-10-CM

## 2017-07-23 DIAGNOSIS — Z7722 Contact with and (suspected) exposure to environmental tobacco smoke (acute) (chronic): Secondary | ICD-10-CM | POA: Diagnosis present

## 2017-07-23 DIAGNOSIS — Z96653 Presence of artificial knee joint, bilateral: Secondary | ICD-10-CM | POA: Diagnosis present

## 2017-07-23 DIAGNOSIS — E669 Obesity, unspecified: Secondary | ICD-10-CM | POA: Diagnosis present

## 2017-07-23 DIAGNOSIS — Z823 Family history of stroke: Secondary | ICD-10-CM | POA: Diagnosis not present

## 2017-07-23 DIAGNOSIS — F419 Anxiety disorder, unspecified: Secondary | ICD-10-CM | POA: Diagnosis present

## 2017-07-23 DIAGNOSIS — F329 Major depressive disorder, single episode, unspecified: Secondary | ICD-10-CM | POA: Diagnosis present

## 2017-07-23 DIAGNOSIS — Z951 Presence of aortocoronary bypass graft: Secondary | ICD-10-CM | POA: Diagnosis not present

## 2017-07-23 DIAGNOSIS — R001 Bradycardia, unspecified: Secondary | ICD-10-CM | POA: Diagnosis not present

## 2017-07-23 DIAGNOSIS — Z888 Allergy status to other drugs, medicaments and biological substances status: Secondary | ICD-10-CM

## 2017-07-23 DIAGNOSIS — K589 Irritable bowel syndrome without diarrhea: Secondary | ICD-10-CM | POA: Diagnosis present

## 2017-07-23 DIAGNOSIS — I2510 Atherosclerotic heart disease of native coronary artery without angina pectoris: Secondary | ICD-10-CM | POA: Diagnosis present

## 2017-07-23 DIAGNOSIS — Z7982 Long term (current) use of aspirin: Secondary | ICD-10-CM

## 2017-07-23 DIAGNOSIS — I110 Hypertensive heart disease with heart failure: Secondary | ICD-10-CM | POA: Diagnosis present

## 2017-07-23 DIAGNOSIS — E271 Primary adrenocortical insufficiency: Secondary | ICD-10-CM | POA: Diagnosis present

## 2017-07-23 DIAGNOSIS — Z7952 Long term (current) use of systemic steroids: Secondary | ICD-10-CM

## 2017-07-23 DIAGNOSIS — I4581 Long QT syndrome: Secondary | ICD-10-CM | POA: Diagnosis not present

## 2017-07-23 DIAGNOSIS — Z882 Allergy status to sulfonamides status: Secondary | ICD-10-CM

## 2017-07-23 DIAGNOSIS — I5032 Chronic diastolic (congestive) heart failure: Secondary | ICD-10-CM | POA: Diagnosis present

## 2017-07-23 DIAGNOSIS — I481 Persistent atrial fibrillation: Principal | ICD-10-CM | POA: Diagnosis present

## 2017-07-23 DIAGNOSIS — Z79899 Other long term (current) drug therapy: Secondary | ICD-10-CM

## 2017-07-23 DIAGNOSIS — Z5181 Encounter for therapeutic drug level monitoring: Secondary | ICD-10-CM

## 2017-07-23 DIAGNOSIS — I441 Atrioventricular block, second degree: Secondary | ICD-10-CM | POA: Diagnosis not present

## 2017-07-23 DIAGNOSIS — K219 Gastro-esophageal reflux disease without esophagitis: Secondary | ICD-10-CM | POA: Diagnosis present

## 2017-07-23 DIAGNOSIS — E785 Hyperlipidemia, unspecified: Secondary | ICD-10-CM | POA: Diagnosis present

## 2017-07-23 HISTORY — DX: Unspecified chronic bronchitis: J42

## 2017-07-23 HISTORY — DX: Migraine, unspecified, not intractable, without status migrainosus: G43.909

## 2017-07-23 LAB — BASIC METABOLIC PANEL
ANION GAP: 12 (ref 5–15)
BUN: 14 mg/dL (ref 6–20)
CALCIUM: 9 mg/dL (ref 8.9–10.3)
CO2: 19 mmol/L — ABNORMAL LOW (ref 22–32)
Chloride: 106 mmol/L (ref 101–111)
Creatinine, Ser: 0.98 mg/dL (ref 0.44–1.00)
GFR, EST NON AFRICAN AMERICAN: 54 mL/min — AB (ref >60)
Glucose, Bld: 128 mg/dL — ABNORMAL HIGH (ref 65–99)
POTASSIUM: 4.1 mmol/L (ref 3.5–5.1)
SODIUM: 137 mmol/L (ref 135–145)

## 2017-07-23 LAB — MAGNESIUM: MAGNESIUM: 1.7 mg/dL (ref 1.7–2.4)

## 2017-07-23 MED ORDER — ACETAMINOPHEN 500 MG PO TABS: 500.0000 mg | ORAL_TABLET | Freq: Every day | ORAL | Status: DC | PRN

## 2017-07-23 MED ORDER — ASPIRIN EC 81 MG PO TBEC
81.0000 mg | DELAYED_RELEASE_TABLET | ORAL | Status: DC
  Administered 2017-07-25 – 2017-07-27 (×3): 81 mg via ORAL
  Filled 2017-07-23 (×3): qty 1

## 2017-07-23 MED ORDER — NITROGLYCERIN 0.4 MG SL SUBL: 0.4000 mg | SUBLINGUAL_TABLET | SUBLINGUAL | Status: DC | PRN

## 2017-07-23 MED ORDER — SODIUM CHLORIDE 0.9% FLUSH
3.0000 mL | Freq: Two times a day (BID) | INTRAVENOUS | Status: DC
  Administered 2017-07-23 – 2017-07-26 (×8): 3 mL via INTRAVENOUS

## 2017-07-23 MED ORDER — IRBESARTAN 150 MG PO TABS
150.0000 mg | ORAL_TABLET | Freq: Every day | ORAL | Status: DC
  Administered 2017-07-24 – 2017-07-27 (×4): 150 mg via ORAL
  Filled 2017-07-23 (×4): qty 1

## 2017-07-23 MED ORDER — POTASSIUM CHLORIDE CRYS ER 10 MEQ PO TBCR
20.0000 meq | EXTENDED_RELEASE_TABLET | Freq: Every day | ORAL | Status: DC
  Administered 2017-07-23 – 2017-07-25 (×3): 20 meq via ORAL
  Filled 2017-07-23 (×5): qty 2

## 2017-07-23 MED ORDER — HYOSCYAMINE SULFATE 0.125 MG SL SUBL
0.1250 mg | SUBLINGUAL_TABLET | Freq: Every day | SUBLINGUAL | Status: DC | PRN
  Filled 2017-07-23: qty 1

## 2017-07-23 MED ORDER — VITAMIN D (ERGOCALCIFEROL) 1.25 MG (50000 UNIT) PO CAPS
50000.0000 [IU] | ORAL_CAPSULE | ORAL | Status: DC
  Administered 2017-07-26: 50000 [IU] via ORAL
  Filled 2017-07-23 (×2): qty 1

## 2017-07-23 MED ORDER — DOFETILIDE 500 MCG PO CAPS: 500.0000 ug | ORAL_CAPSULE | Freq: Two times a day (BID) | ORAL | Status: DC

## 2017-07-23 MED ORDER — FLUOXETINE HCL 10 MG PO CAPS
10.0000 mg | ORAL_CAPSULE | Freq: Every morning | ORAL | Status: DC
  Administered 2017-07-24 – 2017-07-27 (×4): 10 mg via ORAL
  Filled 2017-07-23 (×5): qty 1

## 2017-07-23 MED ORDER — HYDROCORTISONE 20 MG PO TABS
20.0000 mg | ORAL_TABLET | Freq: Every morning | ORAL | Status: DC
  Administered 2017-07-24 – 2017-07-27 (×4): 20 mg via ORAL
  Filled 2017-07-23 (×4): qty 1

## 2017-07-23 MED ORDER — SODIUM CHLORIDE 0.9% FLUSH: 3.0000 mL | INTRAVENOUS | Status: DC | PRN

## 2017-07-23 MED ORDER — PANTOPRAZOLE SODIUM 40 MG PO TBEC
40.0000 mg | DELAYED_RELEASE_TABLET | Freq: Every day | ORAL | Status: DC
  Administered 2017-07-24 – 2017-07-27 (×4): 40 mg via ORAL
  Filled 2017-07-23 (×4): qty 1

## 2017-07-23 MED ORDER — LEVOTHYROXINE SODIUM 88 MCG PO TABS
88.0000 ug | ORAL_TABLET | Freq: Every day | ORAL | Status: DC
  Administered 2017-07-24 – 2017-07-27 (×4): 88 ug via ORAL
  Filled 2017-07-23 (×5): qty 1

## 2017-07-23 MED ORDER — HYDROCORTISONE 10 MG PO TABS
10.0000 mg | ORAL_TABLET | Freq: Every evening | ORAL | Status: DC
  Administered 2017-07-23 – 2017-07-26 (×4): 10 mg via ORAL
  Filled 2017-07-23 (×6): qty 1

## 2017-07-23 MED ORDER — APIXABAN 5 MG PO TABS
5.0000 mg | ORAL_TABLET | Freq: Two times a day (BID) | ORAL | Status: DC
  Administered 2017-07-23 – 2017-07-27 (×8): 5 mg via ORAL
  Filled 2017-07-23 (×9): qty 1

## 2017-07-23 MED ORDER — RISAQUAD PO CAPS
1.0000 | ORAL_CAPSULE | Freq: Every day | ORAL | Status: DC
  Administered 2017-07-23 – 2017-07-27 (×5): 1 via ORAL
  Filled 2017-07-23 (×5): qty 1

## 2017-07-23 MED ORDER — MAGNESIUM SULFATE 2 GM/50ML IV SOLN
2.0000 g | Freq: Once | INTRAVENOUS | Status: AC
  Administered 2017-07-23: 2 g via INTRAVENOUS
  Filled 2017-07-23: qty 50

## 2017-07-23 MED ORDER — DILTIAZEM HCL ER COATED BEADS 180 MG PO CP24
180.0000 mg | ORAL_CAPSULE | Freq: Two times a day (BID) | ORAL | Status: DC
  Administered 2017-07-23 – 2017-07-27 (×8): 180 mg via ORAL
  Filled 2017-07-23 (×8): qty 1

## 2017-07-23 MED ORDER — DOFETILIDE 500 MCG PO CAPS
500.0000 ug | ORAL_CAPSULE | Freq: Two times a day (BID) | ORAL | Status: DC
  Administered 2017-07-23 – 2017-07-24 (×2): 500 ug via ORAL
  Filled 2017-07-23 (×2): qty 1

## 2017-07-23 MED ORDER — CLONAZEPAM 0.5 MG PO TABS: 0.5000 mg | ORAL_TABLET | Freq: Two times a day (BID) | ORAL | Status: DC | PRN

## 2017-07-23 MED ORDER — PROBIOTIC FORMULA 1-250 BILLION-MG PO CAPS: ORAL_CAPSULE | ORAL | Status: DC

## 2017-07-23 MED ORDER — SODIUM CHLORIDE 0.9 % IV SOLN
250.0000 mL | INTRAVENOUS | Status: DC | PRN
Start: 2017-07-23 — End: 2017-07-27

## 2017-07-23 NOTE — H&P (Addendum)
Cardiology Admission History and Physical:   Patient ID: [name]; MRN: [id number]; DOB: October 01, 1940   Admission date: 07/23/2017  Primary Care Provider: Crist Infante, MD Primary Cardiologist: Sinclair Grooms, MD  Primary Electrophysiologist:  Was new to Dr. Rayann Heman with Sotalol load admission on 07/03/17  Chief Complaint:  Tikosyn initiation, Afib  Patient Profile:   [name] is a 77 y.o. female with a history of CAD (CABG 2013), chronic CHF (diastolic), HTN, hypothyroidism, severe chronic back pain with nerve stimulator implant, chronic knee pain, IBS, and paroxysmal AFib  History of Present Illness:   [name] was just admitted 07/03/17 for Sotalol load, she maintained SR on drug though was not tolerated 2/2 severe fatigue despite reducing dose to 31m BID.  She was f/u in the AFib clinic post-Sotalol and reported just unable to tolerate the medication, and was self stopped 07/16/17 her last dose of sotalol.  She was seen 07/19/17 back in AFib and was decided to pursue Tikosyn.  She states with the Sotalol she was dragging, wanted to lay in bed all day, eventually after midday starting to feel a bit better, though found this very limiting for her and felt terrible.  Her last dose of Sotalol wa Tuesday of last week, Wed she felt well, Thursday she felt poorly again and suspected she was out of rhythm. She has not missed any doses of her Eliquis since her hospital stay for the sotalol.  We discussed Tikosyn, risks, benefits, she would like to proceed    Past Medical History:  Diagnosis Date  . [place]'s [place] ([place]   . Angina   . Anxiety   . Arthritis    "everywhere"  . [place]   . Chronic lower back pain   . 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  last knee surgery dr. aElmyra Ricksgave large dose of hydrocortisal and avoided the hypotensive side effectas of addison's disease.  . Coronary artery disease    . Depression   . Dysrhythmia   . GERD (gastroesophageal reflux disease)   . Hyperlipemia   . Hypertension   . Hypothyroidism   . Lumbar radiculopathy 04/27/2014  . PONV (postoperative nausea and vomiting)   . Recurrent upper respiratory infection (URI)   . Shortness of breath     Past Surgical History:  Procedure Laterality Date  . ACHILLES TENDON SURGERY  07/16/14   left ankle, bone spur removed  . ANTERIOR LUMBAR FUSION  1980   L4-5  . BACK SURGERY    . CARDIAC CATHETERIZATION  2013  . CARDIAC CATHETERIZATION N/A 09/02/2014   Procedure: Left Heart Cath and Coronary Angiography;  Surgeon: MWellington Hampshire MD;  [facility];  Service: Cardiovascular;  Laterality: N/A;  . CARDIOVERSION N/A 10/22/2015   Procedure: CARDIOVERSION;  Surgeon: MSanda Klein MD;  [facility];  Service: Cardiovascular;  Laterality: N/A;  . CARDIOVERSION N/A 06/07/2017   Procedure: CARDIOVERSION;  Surgeon: MLarey Dresser MD;  [facility];  Service: Cardiovascular;  Laterality: N/A;  . CARDIOVERSION N/A 07/05/2017   Procedure: CARDIOVERSION;  Surgeon: HPixie Casino MD;  [place]  Service: Cardiovascular;  Laterality: N/A;  . CARPAL TUNNEL RELEASE Right   . CATARACT EXTRACTION, BILATERAL Bilateral   . CORONARY ARTERY BYPASS GRAFT  06/26/2011   Procedure: CORONARY ARTERY BYPASS GRAFTING (CABG);  Surgeon: SMelrose Nakayama MD;  [place] [facility]  Service: Open Heart Surgery;  Laterality: N/A;  times using Greater Saphenous Vein Graft harvested endoscopically from right leg  . DIAGNOSTIC LAPAROSCOPY    . DILATION AND CURETTAGE OF UTERUS    . JOINT REPLACEMENT    . KNEE ARTHROSCOPY Bilateral   . LAPAROSCOPIC CHOLECYSTECTOMY    . Nerve stimulator of back  12/10/15  . REVISION TOTAL KNEE ARTHROPLASTY Right   . TONSILLECTOMY    . TOTAL KNEE ARTHROPLASTY Bilateral      Medications Prior to Admission: Prior to Admission medications   Medication Sig  Start Date End Date Taking? Authorizing Provider  acetaminophen (TYLENOL) 500 MG tablet Take 500 mg by mouth daily as needed for headache.    [provider]  aspirin EC 81 MG tablet Take 81 mg by mouth every Monday, Wednesday, and Friday.    [provider]  clonazePAM (KLONOPIN) 0.5 MG tablet TAKE 1/2 TO 1 TABLET TWICE DAILY AS NEEDED Patient taking differently: TAKE 1/2 TO 1 TABLET TWICE DAILY AS NEEDED FOR ANXIETY 10/31/16   Noralee Space, MD  Cyanocobalamin 1000 MCG/ML KIT Inject 1 mL into the muscle See admin instructions. Take one (1) injection as directed every other month.    [provider]  diltiazem (CARDIZEM CD) 180 MG 24 hr capsule Take 1 capsule (180 mg total) by mouth 2 (two) times daily. 07/19/17   Sherran Needs, NP  ELIQUIS 5 MG TABS tablet TAKE ONE TABLET TWICE DAILY 03/23/17   Belva Crome, MD  FLUoxetine (PROZAC) 10 MG tablet Take 10 mg by mouth every morning.     [provider]  furosemide (LASIX) 20 MG tablet Take 20 mg by mouth as needed for fluid or edema.    [provider]  hydrocortisone (CORTEF) 20 MG tablet Take 20 mg by mouth each morning and 10 mg by mouth each evening.    [provider]  hyoscyamine (LEVSIN SL) 0.125 MG SL tablet Place 0.125 mg under the tongue daily as needed for cramping (IBS symptoms).    [provider]  levothyroxine (SYNTHROID, LEVOTHROID) 88 MCG tablet Take 1 tablet (88 mcg total) by mouth daily before breakfast. 03/25/16   Elgergawy, Silver Huguenin, MD  nitroGLYCERIN (NITROSTAT) 0.4 MG SL tablet Place 0.4 mg under the tongue every 5 (five) minutes as needed for chest pain (3 DOSES MAX FOR CHEST PAIN).    [provider]  omeprazole (PRILOSEC) 20 MG capsule Take 20 mg by mouth every morning.  11/19/13   [provider]  potassium chloride (K-DUR) 10 MEQ tablet Take 2 tablets (20 mEq total) by mouth daily. 07/06/17   Erlene Quan, PA-C  Probiotic Product (PROBIOTIC  FORMULA PO) Take 1 capsule by mouth every morning. For upset stomach    [provider]  valsartan (DIOVAN) 320 MG tablet Take 160 mg by mouth daily.  06/08/17   [provider]  Vitamin D, Ergocalciferol, (DRISDOL) 50000 UNITS CAPS Take 50,000 Units by mouth 2 (two) times a week. Takes on Tuesdays and Fridays    [provider]     Allergies:    Allergies  Allergen Reactions  . Zanaflex [Tizanidine] Other (See Comments)    HALLUCINATIONS  . Percocet [Oxycodone-Acetaminophen] Other (See Comments)    Hallucination  . Tramadol     Hallucinations.  . Sulfa Antibiotics Itching    Vaginal itching  . Sulfacetamide Sodium Itching    Vaginal itching    Social History:   Social History   Socioeconomic History  .  Marital status: Widowed    Spouse name: Not on file  . Number of children: Not on file  . Years of education: Not on file  . Highest education level: Not on file  Occupational History  . Occupation: Retired  Scientific laboratory technician  . Financial resource strain: Not on file  . Food insecurity:    Worry: Not on file    Inability: Not on file  . Transportation needs:    Medical: Not on file    Non-medical: Not on file  Tobacco Use  . Smoking status: Passive Smoke Exposure - Never Smoker  . Smokeless tobacco: Never Used  Substance and Sexual Activity  . Alcohol use: No    Alcohol/week: 0.0 oz  . Drug use: No  . Sexual activity: Never    Birth control/protection: Post-menopausal  Lifestyle  . Physical activity:    Days per week: Not on file    Minutes per session: Not on file  . Stress: Not on file  Relationships  . Social connections:    Talks on phone: Not on file    Gets together: Not on file    Attends religious service: Not on file    Active member of club or organization: Not on file    Attends meetings of clubs or organizations: Not on file    Relationship status: Not on file  . Intimate partner violence:    Fear of current or ex partner:  Not on file    Emotionally abused: Not on file    Physically abused: Not on file    Forced sexual activity: Not on file  Other Topics Concern  . Not on file  Social History Narrative  . Not on file    Family History:   The patient's family history includes Heart attack in her father; Hypertension in her maternal grandfather and mother; Stroke in her mother. There is no history of Colon cancer.    ROS:  Please see the history of present illness.  All other ROS reviewed and negative.     Physical Exam/Data:   Vitals:   07/23/17 1155  BP: 112/77  Pulse: 86  Temp: 97.6 F (36.4 C)  TempSrc: Oral  SpO2: 96%   General:  Well nourished, well developed, in no acute distress HEENT: normal Lymph: no adenopathy Neck: no JVD Endocrine:  No thryomegaly Vascular: No carotid bruits Cardiac:  iRRR; no murmurs, gallops or rubs Lungs:  CTA b/l, no wheezing, rhonchi or rales  Abd: soft, nontender, no hepatomegaly  Ext: no edema Musculoskeletal:  No deformities Skin: warm and dry  Neuro:  No gross focal abnormalities noted Psych:  Normal affect    EKG:   AFibm 82bpm, QT is difficult to establish 07/13/17 SB 57bpm, I measure QT 400-420m, QTc is 390-4223mwas personally reviewed  Relevant CV Studies:  09/20/15: TTE Study Conclusions - Left ventricle: The cavity size was normal. Wall thickness was increased in a pattern of moderate LVH. Systolic function was vigorous. The estimated ejection fraction was in the range of 65% to 70%. - Mitral valve: There was mild regurgitation. - Pulmonary arteries: PA peak pressure: 35 mm Hg (S).    Laboratory Data:  Chemistry Recent Labs  Lab 07/23/17 1130  NA 137  K 4.1  CL 106  CO2 19*  GLUCOSE 128*  BUN 14  CREATININE 0.98  CALCIUM 9.0  GFRNONAA 54*  GFRAA >60  ANIONGAP 12    No results for input(s): PROT, ALBUMIN, AST, ALT, ALKPHOS, BILITOT  in the last 168 hours. HematologyNo results for input(s): WBC, RBC, HGB, HCT,  MCV, MCH, MCHC, RDW, PLT in the last 168 hours. Cardiac EnzymesNo results for input(s): TROPONINI in the last 168 hours. No results for input(s): TROPIPOC in the last 168 hours.  BNPNo results for input(s): BNP, PROBNP in the last 168 hours.  DDimer No results for input(s): DDIMER in the last 168 hours.  Radiology/Studies:  No results found.  Assessment and Plan:   1. Paroxysmal AFib     CHA2DS2Vasc is 5, on Eliquis, appropriately dosed     K+ 4.1     Mag 1.7 (replacememt ordered)     Creat 0.98 (Calc CrCl is 68)     QTc last SR QTc was OK on 07/13/17       OK to start [employer] after she has received the mag replacement  Plan for DCCV Wed if not in SR, patient is familiar with the procedure and agreeable  She will need 90 supply of drug written with her co-pay being much better written this way when she goes home  2. CAD     No anginal complaints, continue home meds  3. HTN     No changes today       For questions or updates, please contact Manuel Garcia Please consult [email or web address] for contact info under Cardiology/STEMI.    Signed, Baldwin Jamaica, PA-C  07/23/2017 12:30 PM   EP attending  Patient seen and examined.  Agree with the findings as noted above.  The patient presents today for initiation of dofetilide therapy.  She had previously maintained sinus rhythm on sotalol, but became intolerant of its side effects.  She has rate controlled atrial fibrillation, but feels worse when she is not in sinus rhythm.  Her exam is notable for a 77 year old obese woman in no distress.  Vitals are stable.  Cardiovascular exam revealed an irregularly irregular rhythm.  The lungs are clear bilaterally to auscultation.  Extremities demonstrate no edema.  Abdominal exam is obese and nontender.  EKG and telemetry strips have been reviewed.  She will be admitted to the hospital for initiation of dofetilide therapy based on her kidney function.  We will follow serial EKGs.   Additional recommendations will be based on the EKGs once dofetilide has been initiated.  Crissie Sickles, MD

## 2017-07-23 NOTE — Progress Notes (Signed)
Primary Care Physician: Crist Infante, MD Referring Physician: Dr. Jannet Askew is a 77 y.o. female with a h/o coronary artery disease with prior bypass grafting, atrial fibrillation resulting in acute diastolic heart failure, hypertension, chronic anticoagulation therapy with Eliquisand hyperlipidemia.Was on amiodarone which was subsequently discontinued  In 2017,2/2 to intolerance, but pt can not remember in which  way she did not tolerate.  She was found to be in  afib 3/6 when seen by [name] and subsequently had successful cardioversion 3/7. She was seen in the afib clinic, 3/11, at Dr. Thompson Caul suggestion to get pt on sotalol, as she is very symptomatic in afib.[name]  She was in SR and feels improved. She is also on eliquis 5 mg bid and has not had any missed doses. She had a long qt around 500 ms and discussed with Dr. Rayann Heman who suggested that pt contact PCP to see if Prozac dose  could be cut in half, to minimize qt to get sotalol on board. Pt did this and qt did shorten.  Pt is in clinic, 4/2, and is ready to come into hospital for sotalol load. She started feeling lightheaded yesterday and is found to be in  afib with v rate at 117 bpm. No missed doses of eliquis 5 mg bid. No benadryl use.  F/u sotalol load 4/12. Pt is staying in rhythm but states " I can not live like this" and wants to stop drug. She has felt draggy, lightheaded, no energy since she started drug. We discussed cutting the dose in half instead of stopping drug to see if she can still maintain SR and she is willing to do this.  F/u 4/18. She called the office Monday and c/o of not being able to tolerate the sotalol, even at 40 mg bid, "still can't function , I have no energy." So she did not have sotalol on Tuesday,felt good, but started feeling poorly yesterday.Ekg shows afib at 106 bpm. She has checked on the price of tikosyn and with mail order can afford drug and is wanting to come into hospital next week for  Tikosyn load.   F/u in afib clinic, pt is here for tikosyn load. She continues in afib,rate controlled. She does not feel well in afib, "drunk" no energy.No missed doses of eliquis.   Today, she denies symptoms of  chest pain, shortness of breath, orthopnea, PND, lower extremity edema, dizziness, presyncope, syncope, or neurologic sequela.+  Fatigue/lightheadedness.  Past Medical History:  Diagnosis Date  . Addison's disease ([place])   . Angina   . Anxiety   . Arthritis    "everywhere"  . Atrial fibrillation ([place])   . Chronic lower back pain   . 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  last knee surgery dr. Elmyra Ricks gave large dose of hydrocortisal and avoided the hypotensive side effectas of addison's disease.  . Coronary artery disease   . Depression   . Dysrhythmia   . GERD (gastroesophageal reflux disease)   . Hyperlipemia   . Hypertension   . Hypothyroidism   . Lumbar radiculopathy 04/27/2014  . PONV (postoperative nausea and vomiting)   . Recurrent upper respiratory infection (URI)   . Shortness of breath    Past Surgical History:  Procedure Laterality Date  . ACHILLES TENDON SURGERY  07/16/14   left ankle, bone spur removed  . ANTERIOR LUMBAR FUSION  1980   L4-5  . BACK SURGERY    .  CARDIAC CATHETERIZATION  2013  . CARDIAC CATHETERIZATION N/A 09/02/2014   Procedure: Left Heart Cath and Coronary Angiography;  Surgeon: Wellington Hampshire, MD;  Location: [place] [facility];  Service: Cardiovascular;  Laterality: N/A;  . CARDIOVERSION N/A 10/22/2015   Procedure: CARDIOVERSION;  Surgeon: Sanda Klein, MD;  Location: [place] [facility];  Service: Cardiovascular;  Laterality: N/A;  . CARDIOVERSION N/A 06/07/2017   Procedure: CARDIOVERSION;  Surgeon: Larey Dresser, MD;  [facility];  Service: Cardiovascular;  Laterality: N/A;  . CARDIOVERSION N/A 07/05/2017   Procedure: CARDIOVERSION;  Surgeon: Pixie Casino, MD;  Location: [place];  Service: Cardiovascular;  Laterality: N/A;  . CARPAL TUNNEL RELEASE Right   . CATARACT EXTRACTION, BILATERAL Bilateral   . CORONARY ARTERY BYPASS GRAFT  06/26/2011   Procedure: CORONARY ARTERY BYPASS GRAFTING (CABG);  Surgeon: Melrose Nakayama, MD;  Location: [place];  Service: Open Heart Surgery;  Laterality: N/A;  times using Greater Saphenous Vein Graft harvested endoscopically from right leg  . DIAGNOSTIC LAPAROSCOPY    . DILATION AND CURETTAGE OF UTERUS    . JOINT REPLACEMENT    . KNEE ARTHROSCOPY Bilateral   . LAPAROSCOPIC CHOLECYSTECTOMY    . Nerve stimulator of back  12/10/15  . REVISION TOTAL KNEE ARTHROPLASTY Right   . TONSILLECTOMY    . TOTAL KNEE ARTHROPLASTY Bilateral     Current Outpatient Medications  Medication Sig Dispense Refill  . acetaminophen (TYLENOL) 500 MG tablet Take 500 mg by mouth daily as needed for headache.    [name] aspirin EC 81 MG tablet Take 81 mg by mouth every Monday, Wednesday, and Friday.    . clonazePAM (KLONOPIN) 0.5 MG tablet TAKE 1/2 TO 1 TABLET TWICE DAILY AS NEEDED (Patient taking differently: TAKE 1/2 TO 1 TABLET TWICE DAILY AS NEEDED FOR ANXIETY) 60 tablet 5  . Cyanocobalamin 1000 MCG/ML KIT Inject 1 mL into the muscle See admin instructions. Take one (1) injection as directed every other month.    . diltiazem (CARDIZEM CD) 180 MG 24 hr capsule Take 1 capsule (180 mg total) by mouth 2 (two) times daily.    [name] ELIQUIS 5 MG TABS tablet TAKE ONE TABLET TWICE DAILY 60 tablet 5  . FLUoxetine (PROZAC) 10 MG tablet Take 10 mg by mouth every morning.     . furosemide (LASIX) 20 MG tablet Take 20 mg by mouth as needed for fluid or edema.    . hydrocortisone (CORTEF) 20 MG tablet Take 20 mg by mouth each morning and 10 mg by mouth each evening.    . hyoscyamine (LEVSIN SL) 0.125 MG SL tablet Place 0.125 mg under the tongue daily as needed for cramping (IBS symptoms).    [name] levothyroxine (SYNTHROID, LEVOTHROID) 88 MCG tablet Take 1 tablet (88 mcg  total) by mouth daily before breakfast. 30 tablet 0  . nitroGLYCERIN (NITROSTAT) 0.4 MG SL tablet Place 0.4 mg under the tongue every 5 (five) minutes as needed for chest pain (3 DOSES MAX FOR CHEST PAIN).    [name] omeprazole (PRILOSEC) 20 MG capsule Take 20 mg by mouth every morning.     . potassium chloride (K-DUR) 10 MEQ tablet Take 2 tablets (20 mEq total) by mouth daily. 60 tablet 3  . Probiotic Product (PROBIOTIC FORMULA PO) Take 1 capsule by mouth every morning. For upset stomach    . valsartan (DIOVAN) 320 MG tablet Take 160 mg by mouth daily.     . Vitamin D, Ergocalciferol, (DRISDOL) 50000 UNITS CAPS Take  50,000 Units by mouth 2 (two) times a week. Takes on Tuesdays and Fridays     No current facility-administered medications for this encounter.     Allergies  Allergen Reactions  . Zanaflex [Tizanidine] Other (See Comments)    HALLUCINATIONS  . Percocet [Oxycodone-Acetaminophen] Other (See Comments)    Hallucination  . Tramadol     Hallucinations.  . Sulfa Antibiotics Itching    Vaginal itching  . Sulfacetamide Sodium Itching    Vaginal itching    Social History   Socioeconomic History  . Marital status: Widowed    Spouse name: Not on file  . Number of children: Not on file  . Years of education: Not on file  . Highest education level: Not on file  Occupational History  . Occupation: Retired  Scientific laboratory technician  . Financial resource strain: Not on file  . Food insecurity:    Worry: Not on file    Inability: Not on file  . Transportation needs:    Medical: Not on file    Non-medical: Not on file  Tobacco Use  . Smoking status: Passive Smoke Exposure - Never Smoker  . Smokeless tobacco: Never Used  Substance and Sexual Activity  . Alcohol use: No    Alcohol/week: 0.0 oz  . Drug use: No  . Sexual activity: Never    Birth control/protection: Post-menopausal  Lifestyle  . Physical activity:    Days per week: Not on file    Minutes per session: Not on file  . Stress:  Not on file  Relationships  . Social connections:    Talks on phone: Not on file    Gets together: Not on file    Attends religious service: Not on file    Active member of club or organization: Not on file    Attends meetings of clubs or organizations: Not on file    Relationship status: Not on file  . Intimate partner violence:    Fear of current or ex partner: Not on file    Emotionally abused: Not on file    Physically abused: Not on file    Forced sexual activity: Not on file  Other Topics Concern  . Not on file  Social History Narrative  . Not on file    Family History  Problem Relation Age of Onset  . Heart attack Father   . Hypertension Mother   . Stroke Mother   . Hypertension Maternal Grandfather   . Colon cancer Neg Hx     ROS- All systems are reviewed and negative except as per the HPI above  Physical Exam: Vitals:   07/23/17 1106  BP: 116/64  Pulse: 82  Weight: 201 lb (91.2 kg)  Height: 5' 4.5" (1.638 m)   Wt Readings from Last 3 Encounters:  07/23/17 201 lb (91.2 kg)  07/19/17 201 lb (91.2 kg)  07/13/17 201 lb 3.2 oz (91.3 kg)    Labs: Lab Results  Component Value Date   NA 140 07/13/2017   K 4.1 07/13/2017   CL 105 07/13/2017   CO2 26 07/13/2017   GLUCOSE 147 (H) 07/13/2017   BUN 19 07/13/2017   CREATININE 0.87 07/13/2017   CALCIUM 9.3 07/13/2017   MG 1.9 07/13/2017   Lab Results  Component Value Date   INR 1.1 06/06/2017   Lab Results  Component Value Date   CHOL 100 09/02/2014   HDL 40 (L) 09/02/2014   LDLCALC 44 09/02/2014   TRIG 78 09/02/2014  GEN- The patient is well appearing, alert and oriented x 3 today.   Head- normocephalic, atraumatic Eyes-  Sclera clear, conjunctiva pink Ears- hearing intact Oropharynx- clear Neck- supple, no JVP Lymph- no cervical lymphadenopathy Lungs- Clear to ausculation bilaterally, normal work of breathing Heart-irregular rate and rhythm, no murmurs, rubs or gallops, PMI not laterally  displaced GI- soft, NT, ND, + BS Extremities- no clubbing, cyanosis, or edema MS- no significant deformity or atrophy Skin- no rash or lesion Psych- euthymic mood, full affect Neuro- strength and sensation are intact  EKG- afib at 82 bpm, qrs int 112 ms, qtc 460 ms(Qtc in SR on sotalol was in range) EPIC records reviewed    Assessment and Plan: 1. Paroxysmal afib Maintained SR on sotalol but was very symptomatic with fatigue, presyncope Even with dose of 40 mg bid, pt continued to be symptomatic She wanted to stop drug, off since last Monday, felt better x one day until afib returned She wanted to come back in today for tikosyn load, can afford tikosyn Remembers now that she did not try amiodarone in the past herself but it " nearly killed my husband" and she did not want to try Continue eliquis 5 mg bid,states no known doses Increased diltiazem to 180 mg bid for better rate control, may need to be decreased in SR if HR slow Valsartan dose was reduced  in half to prevent hypotension with extra cardizem Bmet/mag pending  [name] C. Linzi Ohlinger, [facility] [address] [place], [place] [address] [phone]

## 2017-07-23 NOTE — Progress Notes (Signed)
Pharmacy Review for Dofetilide (Tikosyn) Initiation  Admit Complaint: 77 y.o. female admitted 07/23/2017 with atrial fibrillation to be initiated on dofetilide.   Assessment:  Patient Exclusion Criteria: If any screening criteria checked as "Yes", then  patient  should NOT receive dofetilide until criteria item is corrected. If "Yes" please indicate correction plan.  YES  NO Patient  Exclusion Criteria Correction Plan  [x]  []  Baseline QTc interval is greater than or equal to 440 msec. IF above YES box checked dofetilide contraindicated unless patient has ICD; then may proceed if QTc 500-550 msec or with known ventricular conduction abnormalities may proceed with QTc 550-600 msec. QTc = 460 MD aware  [x]  []  Magnesium level is less than 1.8 mEq/l : Last magnesium:  Lab Results  Component Value Date   MG 1.7 07/23/2017       Magnesium 2gIV x 1  []  [x]  Potassium level is less than 4 mEq/l : Last potassium:  Lab Results  Component Value Date   K 4.1 07/23/2017         []  [x]  Patient is known or suspected to have a digoxin level greater than 2 ng/ml: No results found for: DIGOXIN    []  [x]  Creatinine clearance less than 20 ml/min (calculated using Cockcroft-Gault, actual body weight and serum creatinine): Estimated Creatinine Clearance: 52.3 mL/min (by C-G formula based on SCr of 0.98 mg/dL).    []  [x]  Patient has received drugs known to prolong the QT intervals within the last 48 hours (phenothiazines, tricyclics or tetracyclic antidepressants, erythromycin, H-1 antihistamines, cisapride, fluoroquinolones, azithromycin). Drugs not listed above may have an, as yet, undetected potential to prolong the QT interval, updated information on QT prolonging agents is available at this website:QT prolonging agents   []  [x]  Patient received a dose of hydrochlorothiazide (Oretic) alone or in any combination including triamterene (Dyazide, Maxzide) in the last 48 hours.   []  [x]  Patient received a  medication known to increase dofetilide plasma concentrations prior to initial dofetilide dose:  . Trimethoprim (Primsol, Proloprim) in the last 36 hours . Verapamil (Calan, Verelan) in the last 36 hours or a sustained release dose in the last 72 hours . Megestrol (Megace) in the last 5 days  . Cimetidine (Tagamet) in the last 6 hours . Ketoconazole (Nizoral) in the last 24 hours . Itraconazole (Sporanox) in the last 48 hours  . Prochlorperazine (Compazine) in the last 36 hours    []  [x]  Patient is known to have a history of torsades de pointes; congenital or acquired long QT syndromes. Prozac dose adjusted.  []  [x]  Patient has received a Class 1 antiarrhythmic with less than 2 half-lives since last dose. (Disopyramide, Quinidine, Procainamide, Lidocaine, Mexiletine, Flecainide, Propafenone)   []  [x]  Patient has received amiodarone therapy in the past 3 months or amiodarone level is greater than 0.3 ng/ml.    Patient has been appropriately anticoagulated with Eliquis.  Ordering provider was confirmed at [email or web address] if they are not listed on the [place] Prescribers list.  Goal of Therapy: Follow renal function, electrolytes, potential drug interactions, and dose adjustment. Provide education and 1 week supply at discharge.  Plan:  [x]   Physician selected initial dose within range recommended for patients level of renal function - will monitor for response.  []   Physician selected initial dose outside of range recommended for patients level of renal function - will discuss if the dose should be altered at this time.   Select One Calculated CrCl  Dose q12h  [x]  >  60 ml/min 500 mcg  []  40-60 ml/min 250 mcg  []  20-40 ml/min 125 mcg   2. Follow up QTc after the first 5 doses, renal function, electrolytes (K & Mg) daily x 3     days, dose adjustment, success of initiation and facilitate 1 week discharge supply as     clinically indicated.  3. Initiate Tikosyn education  video (Call [phone] and ask for Tikosyn Video # 116).  4. Place Enrollment Form on the chart for discharge supply of dofetilide.   Liya Strollo S. Alford Highland, PharmD, [place] Clinical Staff Pharmacist Pager [phone]  Eilene Ghazi Stillinger 12:51 PM 07/23/2017

## 2017-07-23 NOTE — Progress Notes (Signed)
error 

## 2017-07-24 LAB — BASIC METABOLIC PANEL
ANION GAP: 5 (ref 5–15)
BUN: 13 mg/dL (ref 6–20)
CALCIUM: 8.2 mg/dL — AB (ref 8.9–10.3)
CO2: 24 mmol/L (ref 22–32)
Chloride: 108 mmol/L (ref 101–111)
Creatinine, Ser: 0.87 mg/dL (ref 0.44–1.00)
GFR calc Af Amer: >60 mL/min (ref >60)
GLUCOSE: 159 mg/dL — AB (ref 65–99)
Potassium: 4.4 mmol/L (ref 3.5–5.1)
Sodium: 137 mmol/L (ref 135–145)

## 2017-07-24 LAB — MAGNESIUM: MAGNESIUM: 2.1 mg/dL (ref 1.7–2.4)

## 2017-07-24 MED ORDER — DOFETILIDE 250 MCG PO CAPS
250.0000 ug | ORAL_CAPSULE | Freq: Two times a day (BID) | ORAL | Status: DC
  Administered 2017-07-24 – 2017-07-25 (×2): 250 ug via ORAL
  Filled 2017-07-24 (×2): qty 1

## 2017-07-24 NOTE — Progress Notes (Addendum)
   Electrophysiology Rounding Note  Patient Name: [name] Date of Encounter: 07/24/2017  Primary Cardiologist: Tamala Julian Electrophysiologist: Allred   Subjective   The patient is doing well today.  At this time, the patient denies chest pain, shortness of breath, or any new concerns.   Inpatient Medications    Scheduled Meds: . acidophilus  1 capsule Oral Daily  . apixaban  5 mg Oral BID  . [START ON 07/25/2017] aspirin EC  81 mg Oral Q M,W,F  . diltiazem  180 mg Oral BID  . dofetilide  500 mcg Oral BID  . FLUoxetine  10 mg Oral q morning - 10a  . hydrocortisone  10 mg Oral QPM  . hydrocortisone  20 mg Oral q morning - 10a  . irbesartan  150 mg Oral Daily  . levothyroxine  88 mcg Oral QAC breakfast  . pantoprazole  40 mg Oral Daily  . potassium chloride  20 mEq Oral Daily  . sodium chloride flush  3 mL Intravenous Q12H  . Vitamin D (Ergocalciferol)  50,000 Units Oral Once per day on Mon Thu   Continuous Infusions: . sodium chloride     PRN Meds: sodium chloride, hyoscyamine, sodium chloride flush   Vital Signs    Vitals:   07/23/17 1155 07/23/17 2038 07/24/17 0522  BP: 112/77 (!) 101/59 131/66  Pulse: 86 (!) 50 (!) 51  Temp: 97.6 F (36.4 C) 97.7 F (36.5 C) 97.7 F (36.5 C)  TempSrc: Oral Oral Oral  SpO2: 96% 97% 97%  Weight:   201 lb 9.6 oz (91.4 kg)    Intake/Output Summary (Last 24 hours) at 07/24/2017 0707 Last data filed at 07/23/2017 1859 Gross per 24 hour  Intake 360 ml  Output -  Net 360 ml   Filed Weights   07/24/17 0522  Weight: 201 lb 9.6 oz (91.4 kg)    Physical Exam    GEN- The patient is elderly appearing, alert and oriented x 3 today.   Head- normocephalic, atraumatic Eyes-  Sclera clear, conjunctiva pink Ears- hearing intact Oropharynx- clear Neck- supple Lungs- Clear to ausculation bilaterally, normal work of breathing Heart- Irregular rate and rhythm  GI- soft, NT, ND, + BS Extremities- no clubbing, cyanosis, or  edema Skin- no rash or lesion Psych- euthymic mood, full affect Neuro- strength and sensation are intact  Labs  Basic Metabolic Panel Recent Labs    07/23/17 1130 07/24/17 0359  NA 137 137  K 4.1 4.4  CL 106 108  CO2 19* 24  GLUCOSE 128* 159*  BUN 14 13  CREATININE 0.98 0.87  CALCIUM 9.0 8.2*  MG 1.7 2.1     Telemetry    AF with V rates 40-60's -> SR (personally reviewed)  Radiology    No results found.   Patient Profile     [name] is a 77 y.o. female admitted for Tikosyn load  Assessment & Plan    1.  Persistent atrial fibrillation Had symptomatic bradycardia with Sotalol Admitted for Tikosyn BMET, Mg, QTc ok this morning Continue Eliquis for CHADS2VASC of 5 Pt converted to SR after morning's dose of Tikosyn  2.  Bradycardia Will evaluate for symptoms today Consider stopping Cardizem if slow in SR  3.  HTN Stable No change required today  Signed, Chanetta Marshall, NP  07/24/2017, 7:07 AM

## 2017-07-24 NOTE — Discharge Instructions (Addendum)
You have an appointment set up with the Atrial Fibrillation Clinic.  Multiple studies have shown that being followed by a dedicated atrial fibrillation clinic in addition to the standard care you receive from your other physicians improves health. We believe that enrollment in the atrial fibrillation clinic will allow us to better care for you.  ° °The phone number to the Atrial Fibrillation Clinic is [phone]. The clinic is staffed Monday through Friday from 8:30am to 5pm. ° °Parking Directions: The clinic is located in the Heart and Vascular Building connected to [facility]. °1)From [address] turn on to [address] and go to the 3rd entrance  (Heart and Vascular entrance) on the right. °2)Look to the right for Heart &Vascular Parking Garage. °3)A code for the entrance is required please call the clinic to receive this.   °4)Take the elevators to the 1st floor. Registration is in the room with the glass walls at the end of the hallway. ° °If you have any trouble parking or locating the clinic, please don’t hesitate to call [phone]. °Information on my medicine - ELIQUIS® (apixaban) ° °Why was Eliquis® prescribed for you? °Eliquis® was prescribed for you to reduce the risk of a blood clot forming that can cause a stroke if you have a medical condition called atrial fibrillation (a type of irregular heartbeat). ° °What do You need to know about Eliquis® ? °Take your Eliquis® TWICE DAILY - one tablet in the morning and one tablet in the evening with or without food. If you have difficulty swallowing the tablet whole please discuss with your pharmacist how to take the medication safely. ° °Take Eliquis® exactly as prescribed by your doctor and DO NOT stop taking Eliquis® without talking to the doctor who prescribed the medication.  Stopping may increase your risk of developing a stroke.  Refill your prescription before you run out. ° °After discharge, you should have regular check-up  appointments with your healthcare provider that is prescribing your Eliquis®.  In the future your dose may need to be changed if your kidney function or weight changes by a significant amount or as you get older. ° °What do you do if you miss a dose? °If you miss a dose, take it as soon as you remember on the same day and resume taking twice daily.  Do not take more than one dose of ELIQUIS at the same time to make up a missed dose. ° °Important Safety Information °A possible side effect of Eliquis® is bleeding. You should call your healthcare provider right away if you experience any of the following: °? Bleeding from an injury or your nose that does not stop. °? Unusual colored urine (red or dark brown) or unusual colored stools (red or black). °? Unusual bruising for unknown reasons. °? A serious fall or if you hit your head (even if there is no bleeding). ° °Some medicines may interact with Eliquis® and might increase your risk of bleeding or clotting while on Eliquis®. To help avoid this, consult your healthcare provider or pharmacist prior to using any new prescription or non-prescription medications, including herbals, vitamins, non-steroidal anti-inflammatory drugs (NSAIDs) and supplements. ° °This website has more information on Eliquis® (apixaban): http://www.eliquis.com/eliquis/home ° °

## 2017-07-24 NOTE — Progress Notes (Addendum)
Benefit check sent for  dofetilide (TIKOSYN) capsule 500 mcg  Dose: 500 mcg Freq: 2 times daily Route: PO      # 2. JENNY @ [facility] # [phone]   0.TIKOSYN TABLET 500 MCG BID  COVER- YES  CO-PAY- $ 50.00  TIER- 3 DRUG  PRIOR APPROVAL- NO  NO DEDUCTIBLE   2. DOFETILIDE 500 MCG BID  COVER- YES  CO-PAY- $ 50.00  TIER- 3 DRUG  PRIOR APPROVAL- NO  NO DEDUCTIBLE   PREFERRED PHARMACY : YES -[facility]

## 2017-07-24 NOTE — Progress Notes (Addendum)
QTc prolonged after Tikosyn dose this morning. Pt converted to SR. D/w Dr Lovena Le, will decrease dose to 215mcg twice daily  Chanetta Marshall, NP 07/24/2017 1:10 PM   EP attending  Patient seen and examined.  Agree with the findings above.  She has reverted to sinus rhythm.  Her QT interval is prolonged.  We will reduce her dose of dofetilide.  Additional QT monitoring will be undertaken.  I would be inclined not to stop her dofetilide unless she is having polymorphic ventricular tachycardia.  Cristopher Peru, MD

## 2017-07-25 ENCOUNTER — Ambulatory Visit (HOSPITAL_COMMUNITY): Admit: 2017-07-25 | Payer: Medicare Other | Admitting: Cardiovascular Disease

## 2017-07-25 ENCOUNTER — Encounter (HOSPITAL_COMMUNITY)
Admission: RE | Disposition: A | Discharge status: Home / Self Care | Payer: Self-pay | Source: Ambulatory Visit | Attending: Internal Medicine

## 2017-07-25 LAB — BASIC METABOLIC PANEL
Anion gap: 5 (ref 5–15)
BUN: 15 mg/dL (ref 6–20)
CALCIUM: 8.9 mg/dL (ref 8.9–10.3)
CO2: 28 mmol/L (ref 22–32)
CREATININE: 0.9 mg/dL (ref 0.44–1.00)
Chloride: 104 mmol/L (ref 101–111)
GFR calc Af Amer: >60 mL/min (ref >60)
GFR, EST NON AFRICAN AMERICAN: 60 mL/min — AB (ref >60)
Glucose, Bld: 130 mg/dL — ABNORMAL HIGH (ref 65–99)
Potassium: 5.1 mmol/L (ref 3.5–5.1)
Sodium: 137 mmol/L (ref 135–145)

## 2017-07-25 LAB — MAGNESIUM: MAGNESIUM: 2 mg/dL (ref 1.7–2.4)

## 2017-07-25 SURGERY — CARDIOVERSION
Anesthesia: General

## 2017-07-25 MED ORDER — DOFETILIDE 125 MCG PO CAPS
125.0000 ug | ORAL_CAPSULE | Freq: Two times a day (BID) | ORAL | Status: DC
  Administered 2017-07-25 – 2017-07-27 (×4): 125 ug via ORAL
  Filled 2017-07-25 (×4): qty 1

## 2017-07-25 NOTE — Progress Notes (Signed)
Spoke with Dr. Lovena Le about elevated QTC. Tikosyn dose changed. Cont to monitor. Carroll Kinds RN

## 2017-07-25 NOTE — Progress Notes (Signed)
Notified by CCMD patient may be going in/out of a heart block.  Please review telemetry between 1930-1950 on 07/24/2017.  Patient also had a 2.69 second pause at about 0355 per CCMD.

## 2017-07-25 NOTE — Progress Notes (Addendum)
Electrophysiology Rounding Note  Patient Name: [name] Date of Encounter: 07/25/2017  Primary Cardiologist: Tamala Julian Electrophysiologist: Allred   Subjective   The patient is doing well today.  At this time, the patient denies chest pain, shortness of breath, or any new concerns.  Inpatient Medications    Scheduled Meds: . acidophilus  1 capsule Oral Daily  . apixaban  5 mg Oral BID  . aspirin EC  81 mg Oral Q M,W,F  . diltiazem  180 mg Oral BID  . dofetilide  250 mcg Oral BID  . FLUoxetine  10 mg Oral q morning - 10a  . hydrocortisone  10 mg Oral QPM  . hydrocortisone  20 mg Oral q morning - 10a  . irbesartan  150 mg Oral Daily  . levothyroxine  88 mcg Oral QAC breakfast  . pantoprazole  40 mg Oral Daily  . potassium chloride  20 mEq Oral Daily  . sodium chloride flush  3 mL Intravenous Q12H  . Vitamin D (Ergocalciferol)  50,000 Units Oral Once per day on Mon Thu   Continuous Infusions: . sodium chloride     PRN Meds: sodium chloride, hyoscyamine, sodium chloride flush   Vital Signs    Vitals:   07/24/17 1456 07/24/17 2015 07/24/17 2029 07/25/17 0625  BP: (!) 104/45 (!) 113/94  125/74  Pulse: 66 72  63  Resp:  (!) 22    Temp: 98 F (36.7 C) (!) 97.5 F (36.4 C)  97.7 F (36.5 C)  TempSrc: Oral Oral  Oral  SpO2: 95% 97%  94%  Weight:    202 lb 3.2 oz (91.7 kg)  Height:   5' 4.5" (1.638 m)     Intake/Output Summary (Last 24 hours) at 07/25/2017 0720 Last data filed at 07/25/2017 0700 Gross per 24 hour  Intake 463 ml  Output 2175 ml  Net -1712 ml   Filed Weights   07/23/17 1200 07/24/17 0522 07/25/17 0625  Weight: 200 lb 9.6 oz (91 kg) 201 lb 9.6 oz (91.4 kg) 202 lb 3.2 oz (91.7 kg)    Physical Exam    GEN- The patient is elderly appearing, alert and oriented x 3 today.   Head- normocephalic, atraumatic Eyes-  Sclera clear, conjunctiva pink Ears- hearing intact Oropharynx- clear Neck- supple Lungs- Clear to ausculation bilaterally, normal  work of breathing Heart- Regular rate and rhythm  GI- soft, NT, ND, + BS Extremities- no clubbing, cyanosis, or edema Skin- no rash or lesion Psych- euthymic mood, full affect Neuro- strength and sensation are intact  Labs    Basic Metabolic Panel Recent Labs    07/24/17 0359 07/25/17 0337  NA 137 137  K 4.4 5.1  CL 108 104  CO2 24 28  GLUCOSE 159* 130*  BUN 13 15  CREATININE 0.87 0.90  CALCIUM 8.2* 8.9  MG 2.1 2.0     Telemetry    SR (personally reviewed)  Radiology    No results found.   Patient Profile     [name] is a 77 y.o. female admitted for Tikosyn load  Assessment & Plan    1.  Persistent atrial fibrillation Maintaining SR on Tikosyn BMET, Mg ok this morning. QT borderline, Dr Lovena Le reviewed, ok to continue Tikosyn 213mcg twice daily.   Continue Eliquis for CHADS2VASC of 5  2.  Sinus bradycardia Resolved off Sotalol  3.  HTN Stable No change required today  Plan discharge tomorrow  Signed, Chanetta Marshall, NP  07/25/2017, 7:20  AM   EP attending  Patient seen and examined.  Agree with the findings as noted above.  The patient is maintaining sinus rhythm very nicely.  Unfortunately her QT interval continues to prolong.  She will have her dose of dofetilide reduced again.  We will follow her closely.  Anticipate discharge home tomorrow, depending on her QT interval.  Cristopher Peru, MD

## 2017-07-26 LAB — BASIC METABOLIC PANEL
Anion gap: 9 (ref 5–15)
BUN: 14 mg/dL (ref 6–20)
CO2: 24 mmol/L (ref 22–32)
Calcium: 9 mg/dL (ref 8.9–10.3)
Chloride: 104 mmol/L (ref 101–111)
Creatinine, Ser: 0.88 mg/dL (ref 0.44–1.00)
GFR calc Af Amer: >60 mL/min (ref >60)
GLUCOSE: 133 mg/dL — AB (ref 65–99)
POTASSIUM: 4.4 mmol/L (ref 3.5–5.1)
Sodium: 137 mmol/L (ref 135–145)

## 2017-07-26 LAB — MAGNESIUM: Magnesium: 1.9 mg/dL (ref 1.7–2.4)

## 2017-07-26 MED ORDER — OFF THE BEAT BOOK
Freq: Once | Status: AC
  Administered 2017-07-26: 07:00:00
  Filled 2017-07-26: qty 1

## 2017-07-26 NOTE — Progress Notes (Addendum)
Electrophysiology Rounding Note  Patient Name: [name] Date of Encounter: 07/26/2017  Primary Cardiologist: Tamala Julian Electrophysiologist: Allred   Subjective   The patient is doing well today.  At this time, the patient denies chest pain, shortness of breath, or any new concerns.  Inpatient Medications    Scheduled Meds: . acidophilus  1 capsule Oral Daily  . apixaban  5 mg Oral BID  . aspirin EC  81 mg Oral Q M,W,F  . diltiazem  180 mg Oral BID  . dofetilide  125 mcg Oral BID  . FLUoxetine  10 mg Oral q morning - 10a  . hydrocortisone  10 mg Oral QPM  . hydrocortisone  20 mg Oral q morning - 10a  . irbesartan  150 mg Oral Daily  . levothyroxine  88 mcg Oral QAC breakfast  . pantoprazole  40 mg Oral Daily  . sodium chloride flush  3 mL Intravenous Q12H  . Vitamin D (Ergocalciferol)  50,000 Units Oral Once per day on Mon Thu   Continuous Infusions: . sodium chloride     PRN Meds: sodium chloride, hyoscyamine, sodium chloride flush   Vital Signs    Vitals:   07/25/17 0826 07/25/17 1520 07/25/17 2006 07/26/17 0552  BP: (!) 150/76 (!) 114/56 110/60 (!) 146/69  Pulse:  70 70 71  Resp:      Temp:  97.6 F (36.4 C) 97.8 F (36.6 C) (!) 97.3 F (36.3 C)  TempSrc:  Oral Oral Oral  SpO2:   95% 96%  Weight:    199 lb 6.4 oz (90.4 kg)  Height:        Intake/Output Summary (Last 24 hours) at 07/26/2017 0750 Last data filed at 07/26/2017 0600 Gross per 24 hour  Intake 960 ml  Output 2750 ml  Net -1790 ml   Filed Weights   07/24/17 0522 07/25/17 0625 07/26/17 0552  Weight: 201 lb 9.6 oz (91.4 kg) 202 lb 3.2 oz (91.7 kg) 199 lb 6.4 oz (90.4 kg)    Physical Exam    GEN- The patient is well appearing, alert and oriented x 3 today.   Head- normocephalic, atraumatic Eyes-  Sclera clear, conjunctiva pink Ears- hearing intact Oropharynx- clear Neck- supple Lungs- Clear to ausculation bilaterally, normal work of breathing Heart- Regular rate and rhythm  GI-  soft, NT, ND, + BS Extremities- no clubbing, cyanosis, or edema Skin- no rash or lesion Psych- euthymic mood, full affect Neuro- strength and sensation are intact  Labs    Basic Metabolic Panel Recent Labs    07/25/17 0337 07/26/17 0320  NA 137 137  K 5.1 4.4  CL 104 104  CO2 28 24  GLUCOSE 130* 133*  BUN 15 14  CREATININE 0.90 0.88  CALCIUM 8.9 9.0  MG 2.0 1.9    Telemetry    SR, intermittent Mobitz I (personally reviewed)  Radiology    No results found.   Patient Profile     [name] is a 77 y.o. female admitted for Tikosyn load  Assessment & Plan    1.  Persistent atrial fibrillation Maintaining SR on Tikosyn BMET, Mg ok this morning Tikosyn decreased to 128mcg last night with prolonged QT Will probably need to watch another day in the hospital Continue Eliquis for [facility] of 5 If she fails Tikosyn, only other options are Multaq and amiodarone. Would also be reasonable candidate for PVI.   2.  Sinus bradycardia/Mobitz I Asymptomatic Can stop Cardizem if needed in the future,  likely helping to maintain SR at this point  3.  HTN Stable No change required today   Signed, Chanetta Marshall, NP  07/26/2017, 7:50 AM   EP attending  Patient seen and examined.  Agree with the findings as noted above.  Unfortunately her QT interval continues to remain prolonged despite reductions in her dose.  She is maintaining sinus rhythm.  We will require additional observation for 1 more day on the lowest dose of dofetilide.  If her QT interval is acceptable, we will plan on discharge tomorrow with usual follow-up.  She appears to be maintaining sinus rhythm very nicely and has not had side effects from dofetilide.  Cristopher Peru, MD

## 2017-07-27 ENCOUNTER — Encounter (HOSPITAL_COMMUNITY): Payer: Self-pay | Admitting: Nurse Practitioner

## 2017-07-27 LAB — BASIC METABOLIC PANEL
Anion gap: 11 (ref 5–15)
BUN: 16 mg/dL (ref 6–20)
CHLORIDE: 101 mmol/L (ref 101–111)
CO2: 27 mmol/L (ref 22–32)
CREATININE: 0.88 mg/dL (ref 0.44–1.00)
Calcium: 9.3 mg/dL (ref 8.9–10.3)
GFR calc non Af Amer: >60 mL/min (ref >60)
Glucose, Bld: 122 mg/dL — ABNORMAL HIGH (ref 65–99)
Potassium: 3.9 mmol/L (ref 3.5–5.1)
Sodium: 139 mmol/L (ref 135–145)

## 2017-07-27 LAB — MAGNESIUM: Magnesium: 2 mg/dL (ref 1.7–2.4)

## 2017-07-27 MED ORDER — DOFETILIDE 125 MCG PO CAPS: 125.0000 ug | ORAL_CAPSULE | Freq: Two times a day (BID) | ORAL | 1 refills | Status: DC

## 2017-07-27 NOTE — Care Management Note (Signed)
Case Management Note  Patient Details  Name: [name] MRN: [id number] Date of Birth: April 10, 1940  Subjective/Objective:                 Patient with order to DC to home, admitted for Tikosyn load. Provided with 14 day supply at DC, No prior auth required. No other CM needs identified.    Action/Plan:   Expected Discharge Date:  07/27/17               Expected Discharge Plan:  Home/Self Care  In-House Referral:     Discharge planning Services  CM Consult, Medication Assistance  Post Acute Care Choice:    Choice offered to:     DME Arranged:    DME Agency:     HH Arranged:    HH Agency:     Status of Service:  Completed, signed off  If discussed at [employer] of Stay Meetings, dates discussed:    Additional Comments:  Carles Collet, RN 07/27/2017, 12:15 PM

## 2017-07-27 NOTE — Discharge Summary (Addendum)
ELECTROPHYSIOLOGY PROCEDURE DISCHARGE SUMMARY    Patient ID: [name],  MRN: [id number], DOB/AGE: 1940-07-16 77 y.o.  Admit date: 07/23/2017 Discharge date: 07/27/2017  Primary Care Physician: Crist Infante, MD Primary Cardiologist: Tamala Julian Electrophysiologist: Allred  Primary Discharge Diagnosis:  1.  Persistent atrial fibrillation status post Tikosyn loading this admission  Secondary Discharge Diagnosis:  1.  CAD s/p CABG 2.  HTN 3.  GERD  Allergies  Allergen Reactions  . Zanaflex [Tizanidine] Other (See Comments)    HALLUCINATIONS  . Percocet [Oxycodone-Acetaminophen] Other (See Comments)    Hallucination  . Tramadol Other (See Comments)    Hallucinations.  . Sulfa Antibiotics Itching    Vaginal itching  . Sulfacetamide Sodium Itching    Vaginal itching     Procedures This Admission:  1.  Tikosyn loading  Brief HPI: [name] is a 77 y.o. female with a past medical history as noted above.  She was started on Sotalol for AF management but developed symptomatic bradycardia.  Risks, benefits, and alternatives to Tikosyn were reviewed with the patient who wished to proceed.    Hospital Course:  The patient was admitted and Tikosyn was initiated.  Renal function and electrolytes were followed during the hospitalization.  Their QTc remained prolonged and her dose was reduced to 279mg then 1239m.  They were monitored until discharge on telemetry which demonstrated SR.  On the day of discharge, they were examined by Dr TaLovena Leho considered them stable for discharge to home.  Follow-up has been arranged with AF clinic in 1 week and with Dr AlRayann Hemann 4 weeks.   Physical Exam: Vitals:   07/26/17 1402 07/26/17 1402 07/26/17 2030 07/27/17 0505  BP: 130/69 130/69 118/73 133/69  Pulse: 72 72 69 85  Resp:   15   Temp: 98.3 F (36.8 C) 98.3 F (36.8 C) 98.2 F (36.8 C) 97.9 F (36.6 C)  TempSrc: Oral Oral Oral Oral  SpO2: 98% 98% 97% 95%  Weight:    199  lb 14.4 oz (90.7 kg)  Height:        GEN- The patient is well appearing, alert and oriented x 3 today.   HEENT: normocephalic, atraumatic; sclera clear, conjunctiva pink; hearing intact; oropharynx clear; neck supple  Lungs- Clear to ausculation bilaterally, normal work of breathing.  No wheezes, rales, rhonchi Heart- Regular rate and rhythm  GI- soft, non-tender, non-distended, bowel sounds present  Extremities- no clubbing, cyanosis, or edema  MS- no significant deformity or atrophy Skin- warm and dry, no rash or lesion Psych- euthymic mood, full affect Neuro- strength and sensation are intact   Labs:   Lab Results  Component Value Date   WBC 9.0 07/05/2017   HGB 12.3 07/05/2017   HCT 37.5 07/05/2017   MCV 89.9 07/05/2017   PLT 284 07/05/2017    Recent Labs  Lab 07/26/17 0320  NA 137  K 4.4  CL 104  CO2 24  BUN 14  CREATININE 0.88  CALCIUM 9.0  GLUCOSE 133*     Discharge Medications:  Allergies as of 07/27/2017      Reactions   Zanaflex [tizanidine] Other (See Comments)   HALLUCINATIONS   Percocet [oxycodone-acetaminophen] Other (See Comments)   Hallucination   Tramadol Other (See Comments)   Hallucinations.   Sulfa Antibiotics Itching   Vaginal itching   Sulfacetamide Sodium Itching   Vaginal itching      Medication List    TAKE these medications   acetaminophen 500 MG  tablet Commonly known as:  TYLENOL Take 500 mg by mouth daily as needed for headache.   aspirin EC 81 MG tablet Take 81 mg by mouth every Monday, Wednesday, and Friday.   clonazePAM 0.5 MG tablet Commonly known as:  KLONOPIN TAKE 1/2 TO 1 TABLET TWICE DAILY AS NEEDED What changed:  See the new instructions.   Cyanocobalamin 1000 MCG/ML Kit Inject 1 mL into the muscle See admin instructions. Take one (1) injection as directed every other month.   diltiazem 180 MG 24 hr capsule Commonly known as:  CARDIZEM CD Take 1 capsule (180 mg total) by mouth 2 (two) times daily.     dofetilide 125 MCG capsule Commonly known as:  TIKOSYN Take 1 capsule (125 mcg total) by mouth 2 (two) times daily.   ELIQUIS 5 MG Tabs tablet Generic drug:  apixaban TAKE ONE TABLET TWICE DAILY   FLUoxetine 10 MG tablet Commonly known as:  PROZAC Take 10 mg by mouth every morning.   furosemide 20 MG tablet Commonly known as:  LASIX Take 20 mg by mouth as needed for fluid or edema.   hydrocortisone 20 MG tablet Commonly known as:  CORTEF Take 20 mg by mouth each morning and 10 mg by mouth each evening.   hyoscyamine 0.125 MG SL tablet Commonly known as:  LEVSIN SL Place 0.125 mg under the tongue daily as needed for cramping (IBS symptoms).   levothyroxine 88 MCG tablet Commonly known as:  SYNTHROID, LEVOTHROID Take 1 tablet (88 mcg total) by mouth daily before breakfast.   nitroGLYCERIN 0.4 MG SL tablet Commonly known as:  NITROSTAT Place 0.4 mg under the tongue every 5 (five) minutes as needed for chest pain (3 DOSES MAX FOR CHEST PAIN).   omeprazole 20 MG capsule Commonly known as:  PRILOSEC Take 20 mg by mouth every morning.   potassium chloride 10 MEQ tablet Commonly known as:  K-DUR Take 2 tablets (20 mEq total) by mouth daily.   PROBIOTIC FORMULA PO Take 1 capsule by mouth every morning. For upset stomach   valsartan 160 MG tablet Commonly known as:  DIOVAN Take 160 mg by mouth 2 (two) times daily.   Vitamin D (Ergocalciferol) 50000 units Caps capsule Commonly known as:  DRISDOL Take 50,000 Units by mouth 2 (two) times a week. Takes on Tuesdays and Fridays       Disposition:  Discharge Instructions    Diet - low sodium heart healthy   Complete by:  As directed    Increase activity slowly   Complete by:  As directed      Follow-up Information    Crist Infante, MD Follow up.   Specialty:  Internal Medicine Contact information: [place] [address] [place] Follow up on  08/02/2017.   Specialty:  Cardiology Why:  at 2:30PM  Contact information: [address] [id number] [place] [phone]          Duration of Discharge Encounter: Greater than 30 minutes including physician time.  Signed, Chanetta Marshall, NP 07/27/2017 8:08 AM   EP Attending  Patient seen and examined. Agree with above. The patient is doing well without QT prolongation after reducing her dose of dofetilide. She has maintained NSR. Usual followup.  Mikle Bosworth.D.

## 2017-07-27 NOTE — Care Management Important Message (Signed)
Important Message  Patient Details  Name: [name] MRN: [id number] Date of Birth: [birth year]   Medicare Important Message Given:  Yes    Barb Merino Chrystel Barefield 07/27/2017, 12:58 PM

## 2017-08-02 ENCOUNTER — Encounter (HOSPITAL_COMMUNITY): Payer: Self-pay | Admitting: Nurse Practitioner

## 2017-08-02 ENCOUNTER — Ambulatory Visit (HOSPITAL_COMMUNITY)
Admit: 2017-08-02 | Discharge: 2017-08-02 | Disposition: A | Discharge status: Home / Self Care | Payer: Medicare Other | Attending: Nurse Practitioner | Admitting: Nurse Practitioner

## 2017-08-02 VITALS — BP 114/58 | HR 76 | Ht 64.5 in | Wt 203.0 lb

## 2017-08-02 DIAGNOSIS — Z7722 Contact with and (suspected) exposure to environmental tobacco smoke (acute) (chronic): Secondary | ICD-10-CM | POA: Insufficient documentation

## 2017-08-02 DIAGNOSIS — I509 Heart failure, unspecified: Secondary | ICD-10-CM | POA: Insufficient documentation

## 2017-08-02 DIAGNOSIS — I480 Paroxysmal atrial fibrillation: Secondary | ICD-10-CM | POA: Diagnosis not present

## 2017-08-02 DIAGNOSIS — M5416 Radiculopathy, lumbar region: Secondary | ICD-10-CM | POA: Diagnosis not present

## 2017-08-02 DIAGNOSIS — F329 Major depressive disorder, single episode, unspecified: Secondary | ICD-10-CM | POA: Diagnosis not present

## 2017-08-02 DIAGNOSIS — I110 Hypertensive heart disease with heart failure: Secondary | ICD-10-CM | POA: Diagnosis not present

## 2017-08-02 DIAGNOSIS — Z79890 Hormone replacement therapy (postmenopausal): Secondary | ICD-10-CM | POA: Insufficient documentation

## 2017-08-02 DIAGNOSIS — J42 Unspecified chronic bronchitis: Secondary | ICD-10-CM | POA: Insufficient documentation

## 2017-08-02 DIAGNOSIS — E039 Hypothyroidism, unspecified: Secondary | ICD-10-CM | POA: Insufficient documentation

## 2017-08-02 DIAGNOSIS — F419 Anxiety disorder, unspecified: Secondary | ICD-10-CM | POA: Insufficient documentation

## 2017-08-02 DIAGNOSIS — I2510 Atherosclerotic heart disease of native coronary artery without angina pectoris: Secondary | ICD-10-CM | POA: Diagnosis not present

## 2017-08-02 DIAGNOSIS — E785 Hyperlipidemia, unspecified: Secondary | ICD-10-CM | POA: Diagnosis not present

## 2017-08-02 DIAGNOSIS — Z7901 Long term (current) use of anticoagulants: Secondary | ICD-10-CM | POA: Diagnosis not present

## 2017-08-02 DIAGNOSIS — Z951 Presence of aortocoronary bypass graft: Secondary | ICD-10-CM | POA: Diagnosis not present

## 2017-08-02 DIAGNOSIS — E230 Hypopituitarism: Secondary | ICD-10-CM | POA: Diagnosis not present

## 2017-08-02 DIAGNOSIS — Z7982 Long term (current) use of aspirin: Secondary | ICD-10-CM | POA: Insufficient documentation

## 2017-08-02 DIAGNOSIS — Z8249 Family history of ischemic heart disease and other diseases of the circulatory system: Secondary | ICD-10-CM | POA: Diagnosis not present

## 2017-08-02 DIAGNOSIS — K219 Gastro-esophageal reflux disease without esophagitis: Secondary | ICD-10-CM | POA: Diagnosis not present

## 2017-08-02 DIAGNOSIS — Z79899 Other long term (current) drug therapy: Secondary | ICD-10-CM | POA: Diagnosis not present

## 2017-08-02 LAB — BASIC METABOLIC PANEL
Anion gap: 8 (ref 5–15)
BUN: 21 mg/dL — ABNORMAL HIGH (ref 6–20)
CALCIUM: 9.3 mg/dL (ref 8.9–10.3)
CO2: 26 mmol/L (ref 22–32)
Chloride: 103 mmol/L (ref 101–111)
Creatinine, Ser: 1.14 mg/dL — ABNORMAL HIGH (ref 0.44–1.00)
GFR, EST AFRICAN AMERICAN: 52 mL/min — AB (ref >60)
GFR, EST NON AFRICAN AMERICAN: 45 mL/min — AB (ref >60)
Glucose, Bld: 193 mg/dL — ABNORMAL HIGH (ref 65–99)
POTASSIUM: 4.9 mmol/L (ref 3.5–5.1)
Sodium: 137 mmol/L (ref 135–145)

## 2017-08-02 LAB — MAGNESIUM: Magnesium: 1.6 mg/dL — ABNORMAL LOW (ref 1.7–2.4)

## 2017-08-02 MED ORDER — VALSARTAN 80 MG PO TABS: 80.0000 mg | ORAL_TABLET | Freq: Every day | ORAL | 3 refills | Status: DC

## 2017-08-02 NOTE — Patient Instructions (Signed)
Decrease diovan to 80mg  once a day

## 2017-08-02 NOTE — Progress Notes (Signed)
Primary Care Physician: Crist Infante, MD Referring Physician: Dr. Jannet Askew is a 77 y.o. female with a h/o coronary artery disease with prior bypass grafting, atrial fibrillation resulting in acute diastolic heart failure, hypertension, chronic anticoagulation therapy with Eliquisand hyperlipidemia.Was on amiodarone which was subsequently discontinued  In 2017,2/2 to intolerance, but pt can not remember in which  way she did not tolerate.  She was found to be in  afib 3/6 when seen by [name] and subsequently had successful cardioversion 3/7. She was seen in the afib clinic, 3/11, at Dr. Thompson Caul suggestion to get pt on sotalol, as she is very symptomatic in afib.[name]  She was in SR and feels improved. She is also on eliquis 5 mg bid and has not had any missed doses. She had a long qt around 500 ms and discussed with Dr. Rayann Heman who suggested that pt contact PCP to see if Prozac dose  could be cut in half, to minimize qt to get sotalol on board. Pt did this and qt did shorten.  Pt is in clinic, 4/2, and is ready to come into hospital for sotalol load. She started feeling lightheaded yesterday and is found to be in  afib with v rate at 117 bpm. No missed doses of eliquis 5 mg bid. No benadryl use.  F/u sotalol load 4/12. Pt is staying in rhythm but states " I can not live like this" and wants to stop drug. She has felt draggy, lightheaded, no energy since she started drug. We discussed cutting the dose in half instead of stopping drug to see if she can still maintain SR and she is willing to do this.  F/u 4/18. She called the office Monday and c/o of not being able to tolerate the sotalol, even at 40 mg bid, "still can't function , I have no energy." So she did not have sotalol on Tuesday,felt good, but started feeling poorly yesterday.Ekg shows afib at 106 bpm. She has checked on the price of tikosyn and with mail order can afford drug and is wanting to come into hospital next week for  Tikosyn load.   F/u in afib clinic, pt is here for tikosyn load. She continues in afib,rate controlled. She does not feel well in afib, "drunk" no energy.No missed doses of eliquis.  F/u Tikosyn load, 5/2. She  Had qtc prolongation with  Higher doses of Tikosyn and dose had to be lowered to 125 mcg   a day. She follows up today, in SR, but has the same complaints that she did on sotalol. Some days she feel ok but mostly feels draggy, low energy, woozy, lightheaded, short of breath. I explained to her that usually getting people back in SR improves their energy and they feel much better, shortness of breath improves.Phyllis Ginger does not make people feel bad. Sotalol can cause fatigue in some people. Her Prozac dose was decreased to get antiarrythmic's on board and I question if this had an effect. She is overdue to see her endocrinologist, the end of the month, her PCP for physical this next week, and [name] May 7th. Her BP med has been reduced once and possibly hypotension is contributing to symptoms. Standing BP today 110/60, not significantly lower at 114/58 sitting.   Today, she denies symptoms of  chest pain, shortness of breath, orthopnea, PND, lower extremity edema, dizziness, presyncope, syncope, or neurologic sequela.+  Fatigue/lightheadedness/ shortness of breath preceded antiarartyhmic's.  Past Medical History:  Diagnosis  Date  . Addison's disease ([place])   . Anxiety   . Arthritis    "everywhere"  . Atrial fibrillation ([place])   . Chronic bronchitis ([place])    "although I didn't get it this year" (07/23/2017)  . 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  last knee surgery dr. Elmyra Ricks gave large dose of hydrocortisal and avoided the hypotensive side effectas of addison's disease.  . Coronary artery disease   . Depression   . GERD (gastroesophageal reflux disease)   . Hyperlipemia   . Hypertension   . Hypothyroidism   . Lumbar radiculopathy  04/27/2014  . Migraine    "in my 20's" (07/23/2017)  . 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 Recurrent upper respiratory infection (URI)    Past Surgical History:  Procedure Laterality Date  . ACHILLES TENDON SURGERY Left 07/16/2014   bone spur removed  . ANTERIOR LUMBAR FUSION  1980   L4-5  . BACK SURGERY    . CARDIAC CATHETERIZATION  2013  . CARDIAC CATHETERIZATION N/A 09/02/2014   Procedure: Left Heart Cath and Coronary Angiography;  Surgeon: Wellington Hampshire, MD;  Location: [facility];  Service: Cardiovascular;  Laterality: N/A;  . CARDIOVERSION N/A 10/22/2015   Procedure: CARDIOVERSION;  Surgeon: Sanda Klein, MD;  Location: [facility];  Service: Cardiovascular;  Laterality: N/A;  . CARDIOVERSION N/A 06/07/2017   Procedure: CARDIOVERSION;  Surgeon: Larey Dresser, MD;  Location: [facility];  Service: Cardiovascular;  Laterality: N/A;  . CARDIOVERSION N/A 07/05/2017   Procedure: CARDIOVERSION;  Surgeon: Pixie Casino, MD;  Location: [facility];  Service: Cardiovascular;  Laterality: N/A;  . CARPAL TUNNEL RELEASE Right   . CATARACT EXTRACTION, BILATERAL Bilateral   . CORONARY ARTERY BYPASS GRAFT  06/26/2011   Procedure: CORONARY ARTERY BYPASS GRAFTING (CABG);  Surgeon: Melrose Nakayama, MD;  Location: [facility];  Service: Open Heart Surgery;  Laterality: N/A;  times using Greater Saphenous Vein Graft harvested endoscopically from right leg  . DIAGNOSTIC LAPAROSCOPY    . DILATION AND CURETTAGE OF UTERUS    . JOINT REPLACEMENT    . KNEE ARTHROSCOPY Bilateral   . LAPAROSCOPIC CHOLECYSTECTOMY    . REVISION TOTAL KNEE ARTHROPLASTY Right   . SPINAL CORD STIMULATOR IMPLANT  12/10/15  . TONSILLECTOMY    . TOTAL KNEE ARTHROPLASTY Bilateral     Current Outpatient Medications  Medication Sig Dispense Refill  . acetaminophen (TYLENOL) 500 MG tablet Take 500 mg by mouth daily as needed for headache.    [name] aspirin EC 81 MG tablet Take 81 mg by mouth every Monday, Wednesday, and Friday.     . clonazePAM (KLONOPIN) 0.5 MG tablet TAKE 1/2 TO 1 TABLET TWICE DAILY AS NEEDED (Patient taking differently: TAKE 1/2 TO 1 TABLET TWICE DAILY AS NEEDED FOR ANXIETY) 60 tablet 5  . Cyanocobalamin 1000 MCG/ML KIT Inject 1 mL into the muscle See admin instructions. Take one (1) injection as directed every other month.    . diltiazem (CARDIZEM CD) 180 MG 24 hr capsule Take 1 capsule (180 mg total) by mouth 2 (two) times daily.    [name] dofetilide (TIKOSYN) 125 MCG capsule Take 1 capsule (125 mcg total) by mouth 2 (two) times daily. 180 capsule 1  . ELIQUIS 5 MG TABS tablet TAKE ONE TABLET TWICE DAILY 60 tablet 5  . FLUoxetine (PROZAC) 10 MG tablet Take 10 mg by mouth every morning.     . furosemide (LASIX) 20 MG tablet  Take 20 mg by mouth as needed for fluid or edema.    . hydrocortisone (CORTEF) 20 MG tablet Take 20 mg by mouth each morning and 10 mg by mouth each evening.    . hyoscyamine (LEVSIN SL) 0.125 MG SL tablet Place 0.125 mg under the tongue daily as needed for cramping (IBS symptoms).    [name] levothyroxine (SYNTHROID, LEVOTHROID) 88 MCG tablet Take 1 tablet (88 mcg total) by mouth daily before breakfast. 30 tablet 0  . nitroGLYCERIN (NITROSTAT) 0.4 MG SL tablet Place 0.4 mg under the tongue every 5 (five) minutes as needed for chest pain (3 DOSES MAX FOR CHEST PAIN).    [name] omeprazole (PRILOSEC) 20 MG capsule Take 20 mg by mouth every morning.     . potassium chloride (K-DUR) 10 MEQ tablet Take 2 tablets (20 mEq total) by mouth daily. 60 tablet 3  . Probiotic Product (PROBIOTIC FORMULA PO) Take 1 capsule by mouth every morning. For upset stomach    . valsartan (DIOVAN) 80 MG tablet Take 1 tablet (80 mg total) by mouth daily with supper. 30 tablet 3  . Vitamin D, Ergocalciferol, (DRISDOL) 50000 UNITS CAPS Take 50,000 Units by mouth 2 (two) times a week. Takes on Tuesdays and Fridays     No current facility-administered medications for this encounter.     Allergies  Allergen Reactions  .  Zanaflex [Tizanidine] Other (See Comments)    HALLUCINATIONS  . Percocet [Oxycodone-Acetaminophen] Other (See Comments)    Hallucination  . Tramadol Other (See Comments)    Hallucinations.  . Sulfa Antibiotics Itching    Vaginal itching  . Sulfacetamide Sodium Itching    Vaginal itching    Social History   Socioeconomic History  . Marital status: Widowed    Spouse name: Not on file  . Number of children: Not on file  . Years of education: Not on file  . Highest education level: Not on file  Occupational History  . Occupation: Retired  Scientific laboratory technician  . Financial resource strain: Not on file  . Food insecurity:    Worry: Not on file    Inability: Not on file  . Transportation needs:    Medical: Not on file    Non-medical: Not on file  Tobacco Use  . Smoking status: Passive Smoke Exposure - Never Smoker  . Smokeless tobacco: Never Used  . Tobacco comment: "husband passed in 2010; father passed 1978"  Substance and Sexual Activity  . Alcohol use: No    Alcohol/week: 0.0 oz  . Drug use: No  . Sexual activity: Not Currently    Birth control/protection: Post-menopausal  Lifestyle  . Physical activity:    Days per week: Not on file    Minutes per session: Not on file  . Stress: Not on file  Relationships  . Social connections:    Talks on phone: Not on file    Gets together: Not on file    Attends religious service: Not on file    Active member of club or organization: Not on file    Attends meetings of clubs or organizations: Not on file    Relationship status: Not on file  . Intimate partner violence:    Fear of current or ex partner: Not on file    Emotionally abused: Not on file    Physically abused: Not on file    Forced sexual activity: Not on file  Other Topics Concern  . Not on file  Social History Narrative  .  Not on file    Family History  Problem Relation Age of Onset  . Heart attack Father   . Hypertension Mother   . Stroke Mother   .  Hypertension Maternal Grandfather   . Colon cancer Neg Hx     ROS- All systems are reviewed and negative except as per the HPI above  Physical Exam: Vitals:   08/02/17 1439  BP: (!) 114/58  Pulse: 76  Weight: 203 lb (92.1 kg)  Height: 5' 4.5" (1.638 m)   Wt Readings from Last 3 Encounters:  08/02/17 203 lb (92.1 kg)  07/27/17 199 lb 14.4 oz (90.7 kg)  07/23/17 201 lb (91.2 kg)    Labs: Lab Results  Component Value Date   NA 137 08/02/2017   K 4.9 08/02/2017   CL 103 08/02/2017   CO2 26 08/02/2017   GLUCOSE 193 (H) 08/02/2017   BUN 21 (H) 08/02/2017   CREATININE 1.14 (H) 08/02/2017   CALCIUM 9.3 08/02/2017   MG 1.6 (L) 08/02/2017   Lab Results  Component Value Date   INR 1.1 06/06/2017   Lab Results  Component Value Date   CHOL 100 09/02/2014   HDL 40 (L) 09/02/2014   LDLCALC 44 09/02/2014   TRIG 78 09/02/2014     GEN- The patient is well appearing, alert and oriented x 3 today.   Head- normocephalic, atraumatic Eyes-  Sclera clear, conjunctiva pink Ears- hearing intact Oropharynx- clear Neck- supple, no JVP Lymph- no cervical lymphadenopathy Lungs- Clear to ausculation bilaterally, normal work of breathing Heart-regular rate and rhythm, no murmurs, rubs or gallops, PMI not laterally displaced GI- soft, NT, ND, + BS Extremities- no clubbing, cyanosis, or edema MS- no significant deformity or atrophy Skin- no rash or lesion Psych- euthymic mood, full affect Neuro- strength and sensation are intact  EKG-  NSR at 76 bpm, pr int 184 ms, qrs int 104 ms, qtc 452 ms(stable) EPIC records reviewed    Assessment and Plan: 1. Paroxysmal afib Maintained SR on sotalol but was very symptomatic with fatigue, presyncope Even with dose of 40 mg bid, pt continued to be symptomatic She wanted to stop drug She was brought back in for tikosyn load after sotalol washout However, she now feels no better in SR on tikosyn, which I am  puzzled as to her complaints, now I  question if  Her  symptoms are related to something other that treatment for afib  Possible related to BP being over treated Reduce Diovan to 80 mg a day and take at HS, she feels better in the pm   than she does in the am  Continue eliquis 5 mg bid,states no known doses Will leave diltiazem at 180 mg for now, but his may be need to be reduced in the future if HR slow She has many appointments this month to see her other MD's(PCP, endocrinology, cardiology) and her symptoms can be further addressed at these appointments Bmet/mag pending  F/u with Dr. Lovena Le in one month  Geroge Baseman. Fenix Ruppe, [facility] [address] [place], [place] [address] [phone]

## 2017-08-06 ENCOUNTER — Telehealth (HOSPITAL_COMMUNITY): Payer: Self-pay | Admitting: *Deleted

## 2017-08-06 NOTE — Telephone Encounter (Signed)
Pt advised to start magnesium 250 mg bid and have mg rechecked with [name] on her upcoming visit. Pt understood

## 2017-08-09 ENCOUNTER — Encounter: Payer: Self-pay | Admitting: Interventional Cardiology

## 2017-08-09 ENCOUNTER — Ambulatory Visit: Payer: Medicare Other | Admitting: Interventional Cardiology

## 2017-08-09 VITALS — BP 130/68 | HR 83 | Ht 63.5 in | Wt 205.4 lb

## 2017-08-09 DIAGNOSIS — I10 Essential (primary) hypertension: Secondary | ICD-10-CM | POA: Diagnosis not present

## 2017-08-09 DIAGNOSIS — Z79899 Other long term (current) drug therapy: Secondary | ICD-10-CM

## 2017-08-09 DIAGNOSIS — Z7901 Long term (current) use of anticoagulants: Secondary | ICD-10-CM

## 2017-08-09 DIAGNOSIS — I25709 Atherosclerosis of coronary artery bypass graft(s), unspecified, with unspecified angina pectoris: Secondary | ICD-10-CM | POA: Diagnosis not present

## 2017-08-09 DIAGNOSIS — I480 Paroxysmal atrial fibrillation: Secondary | ICD-10-CM | POA: Diagnosis not present

## 2017-08-09 NOTE — Patient Instructions (Signed)

## 2017-08-09 NOTE — Progress Notes (Signed)
Cardiology Office Note    Date:  08/09/2017   ID:  Taylan, Mayhan 1941-01-25, MRN [id number]  PCP:  Crist Infante, MD  Cardiologist: Sinclair Grooms, MD   No chief complaint on file.   History of Present Illness:  [name] is a 77 y.o. female follow-up of coronary artery disease with prior bypass grafting, atrial fibrillation resulting in acute diastolic heart failure, hypertension, chronic anticoagulation therapy with Eliquisand hyperlipidemia.Was on amiodarone which was subsequently discontinued.  She  has most recently been troubled by atrial fibrillation.  Pharmacologic rhythm control was felt to be necessary.  Sotalol was tried and required electrical cardioversion x2.  Ultimately ended up on dofetilide 125 mcg twice daily and seems to be tolerating that medication without significant trouble.  This morning she had an episode where she suddenly became nauseous and noted her heart rate was 120 bpm.  This lasted approximately 5 minutes before spontaneously resolving.  She has never felt this way before.  It did not feel like prior atrial fibrillation.  She currently feels well.  There was no associated chest pain.   Past Medical History:  Diagnosis Date  . Addison's disease ([place])   . Anxiety   . Arthritis    "everywhere"  . Atrial fibrillation ([place])   . Chronic bronchitis ([place])    "although I didn't get it this year" (07/23/2017)  . 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  last knee surgery dr. Elmyra Ricks gave large dose of hydrocortisal and avoided the hypotensive side effectas of addison's disease.  . Coronary artery disease   . Depression   . GERD (gastroesophageal reflux disease)   . Hyperlipemia   . Hypertension   . Hypothyroidism   . Lumbar radiculopathy 04/27/2014  . Migraine    "in my 20's" (07/23/2017)  . 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 Recurrent upper respiratory infection (URI)     Past Surgical  History:  Procedure Laterality Date  . ACHILLES TENDON SURGERY Left 07/16/2014   bone spur removed  . ANTERIOR LUMBAR FUSION  1980   L4-5  . BACK SURGERY    . CARDIAC CATHETERIZATION  2013  . CARDIAC CATHETERIZATION N/A 09/02/2014   Procedure: Left Heart Cath and Coronary Angiography;  Surgeon: Wellington Hampshire, MD;  Location: [facility];  Service: Cardiovascular;  Laterality: N/A;  . CARDIOVERSION N/A 10/22/2015   Procedure: CARDIOVERSION;  Surgeon: Sanda Klein, MD;  Location: [facility];  Service: Cardiovascular;  Laterality: N/A;  . CARDIOVERSION N/A 06/07/2017   Procedure: CARDIOVERSION;  Surgeon: Larey Dresser, MD;  Location: [facility];  Service: Cardiovascular;  Laterality: N/A;  . CARDIOVERSION N/A 07/05/2017   Procedure: CARDIOVERSION;  Surgeon: Pixie Casino, MD;  Location: [facility];  Service: Cardiovascular;  Laterality: N/A;  . CARPAL TUNNEL RELEASE Right   . CATARACT EXTRACTION, BILATERAL Bilateral   . CORONARY ARTERY BYPASS GRAFT  06/26/2011   Procedure: CORONARY ARTERY BYPASS GRAFTING (CABG);  Surgeon: Melrose Nakayama, MD;  Location: [facility];  Service: Open Heart Surgery;  Laterality: N/A;  times using Greater Saphenous Vein Graft harvested endoscopically from right leg  . DIAGNOSTIC LAPAROSCOPY    . DILATION AND CURETTAGE OF UTERUS    . JOINT REPLACEMENT    . KNEE ARTHROSCOPY Bilateral   . LAPAROSCOPIC CHOLECYSTECTOMY    . REVISION TOTAL KNEE ARTHROPLASTY Right   . SPINAL CORD STIMULATOR IMPLANT  12/10/15  . TONSILLECTOMY    .  TOTAL KNEE ARTHROPLASTY Bilateral     Current Medications: Outpatient Medications Prior to Visit  Medication Sig Dispense Refill  . acetaminophen (TYLENOL) 500 MG tablet Take 500 mg by mouth daily as needed for headache.    [name] apixaban (ELIQUIS) 5 MG TABS tablet Take 5 mg by mouth 2 (two) times daily.    [name] aspirin EC 81 MG tablet Take 81 mg by mouth every Monday, Wednesday, and Friday.    . clonazePAM (KLONOPIN) 0.5 MG  tablet Take half (1/2) to one (1) tablet (0.25 mg to 0.5 mg) by mouth twice daily as needed for anxiety.    . Cyanocobalamin 1000 MCG/ML KIT Inject 1 mL into the muscle See admin instructions. Take one (1) injection as directed every other month.    . diltiazem (CARDIZEM CD) 180 MG 24 hr capsule Take 1 capsule (180 mg total) by mouth 2 (two) times daily.    [name] dofetilide (TIKOSYN) 125 MCG capsule Take 1 capsule (125 mcg total) by mouth 2 (two) times daily. 180 capsule 1  . FLUoxetine (PROZAC) 10 MG tablet Take 10 mg by mouth every morning.     . furosemide (LASIX) 20 MG tablet Take 20 mg by mouth as needed for fluid or edema.    . hydrocortisone (CORTEF) 20 MG tablet Take 20 mg by mouth each morning and 10 mg by mouth each evening.    . hyoscyamine (LEVSIN SL) 0.125 MG SL tablet Place 0.125 mg under the tongue daily as needed for cramping (IBS symptoms).    [name] levothyroxine (SYNTHROID, LEVOTHROID) 88 MCG tablet Take 1 tablet (88 mcg total) by mouth daily before breakfast. 30 tablet 0  . Magnesium 250 MG TABS Take 250 mg by mouth daily.    . nitroGLYCERIN (NITROSTAT) 0.4 MG SL tablet Place 0.4 mg under the tongue every 5 (five) minutes as needed for chest pain (3 DOSES MAX FOR CHEST PAIN).    [name] omeprazole (PRILOSEC) 20 MG capsule Take 20 mg by mouth every morning.     . potassium chloride (K-DUR) 10 MEQ tablet Take 2 tablets (20 mEq total) by mouth daily. 60 tablet 3  . Probiotic Product (PROBIOTIC FORMULA PO) Take 1 capsule by mouth every morning. For upset stomach    . valsartan (DIOVAN) 80 MG tablet Take 1 tablet (80 mg total) by mouth daily with supper. 30 tablet 3  . Vitamin D, Ergocalciferol, (DRISDOL) 50000 UNITS CAPS Take 50,000 Units by mouth 2 (two) times a week. Takes on Tuesdays and Fridays    . clonazePAM (KLONOPIN) 0.5 MG tablet TAKE 1/2 TO 1 TABLET TWICE DAILY AS NEEDED (Patient not taking: Reported on 08/09/2017) 60 tablet 5  . ELIQUIS 5 MG TABS tablet TAKE ONE TABLET TWICE DAILY (Patient  not taking: Reported on 08/09/2017) 60 tablet 5   No facility-administered medications prior to visit.      Allergies:   Zanaflex [tizanidine]; Percocet [oxycodone-acetaminophen]; Tramadol; Sulfa antibiotics; and Sulfacetamide sodium   Social History   Socioeconomic History  . Marital status: Widowed    Spouse name: Not on file  . Number of children: Not on file  . Years of education: Not on file  . Highest education level: Not on file  Occupational History  . Occupation: Retired  Scientific laboratory technician  . Financial resource strain: Not on file  . Food insecurity:    Worry: Not on file    Inability: Not on file  . Transportation needs:    Medical: Not on file  Non-medical: Not on file  Tobacco Use  . Smoking status: Passive Smoke Exposure - Never Smoker  . Smokeless tobacco: Never Used  . Tobacco comment: "husband passed in 2010; father passed 1978"  Substance and Sexual Activity  . Alcohol use: No    Alcohol/week: 0.0 oz  . Drug use: No  . Sexual activity: Not Currently    Birth control/protection: Post-menopausal  Lifestyle  . Physical activity:    Days per week: Not on file    Minutes per session: Not on file  . Stress: Not on file  Relationships  . Social connections:    Talks on phone: Not on file    Gets together: Not on file    Attends religious service: Not on file    Active member of club or organization: Not on file    Attends meetings of clubs or organizations: Not on file    Relationship status: Not on file  Other Topics Concern  . Not on file  Social History Narrative  . Not on file     Family History:  The patient's family history includes Heart attack in her father; Hypertension in her maternal grandfather and mother; Stroke in her mother.   ROS:   Please see the history of present illness.    Denies chest pain and dyspnea.  No orthopnea. All other systems reviewed and are negative.   PHYSICAL EXAM:   VS:  BP 130/68   Pulse 83   Ht 5' 3.5" (1.613  m)   Wt 205 lb 6.4 oz (93.2 kg)   BMI 35.81 kg/m    GEN: Well nourished, well developed, in no acute distress  HEENT: normal  Neck: no JVD, carotid bruits, or masses Cardiac: RRR; no murmurs, rubs, or gallops,no edema  Respiratory:  clear to auscultation bilaterally, normal work of breathing GI: soft, nontender, nondistended, + BS MS: no deformity or atrophy  Skin: warm and dry, no rash Neuro:  Alert and Oriented x 3, Strength and sensation are intact Psych: euthymic mood, full affect  Wt Readings from Last 3 Encounters:  08/09/17 205 lb 6.4 oz (93.2 kg)  08/02/17 203 lb (92.1 kg)  07/27/17 199 lb 14.4 oz (90.7 kg)      Studies/Labs Reviewed:   EKG:  EKG sinus rhythm, QTC 477 ms.  Biatrial abnormality.  Nonspecific ST abnormality.  No evidence of infarction.  Recent Labs: 08/24/2016: NT-Pro BNP 528 07/05/2017: Hemoglobin 12.3; Platelets 284 08/02/2017: BUN 21; Creatinine, Ser 1.14; Magnesium 1.6; Potassium 4.9; Sodium 137   Lipid Panel    Component Value Date/Time   CHOL 100 09/02/2014 0346   TRIG 78 09/02/2014 0346   HDL 40 (L) 09/02/2014 0346   CHOLHDL 2.5 09/02/2014 0346   VLDL 16 09/02/2014 0346   LDLCALC 44 09/02/2014 0346    Additional studies/ records that were reviewed today include:  No new or recent imaging studies.    ASSESSMENT:    1. Paroxysmal atrial fibrillation (HCC)   2. Essential hypertension   3. Chronic anticoagulation   4. Coronary artery disease involving coronary bypass graft of native heart with angina pectoris ([place])   5. On dofetilide therapy      PLAN:  In order of problems listed above:  1. Currently with controlled rhythm on Tikosyn 125 mcg twice daily. 2. Target blood pressure 130/80 mmHg.  Currently on adequate therapy. 3. No bleeding complications on apixaban 5 mg twice daily. 4. Stable without angina.  Months for any recurrence of chest pain.  5. Currently on dofetilide therapy.  No evidence of ST T wave abnormality/prolonged  QT.  Recent magnesium and potassium were normal.  Report any recurrence of nausea and tachycardia.  Discussed this with Dr. Lovena Le when she sees him in June.  May have to wear a 30-day monitor if these episodes continue.  Encouraged physical activity.  Clinical follow-up in 6 months.  Medication Adjustments/Labs and Tests Ordered: Current medicines are reviewed at length with the patient today.  Concerns regarding medicines are outlined above.  Medication changes, Labs and Tests ordered today are listed in the Patient Instructions below. Patient Instructions  Medication Instructions:  Your physician recommends that you continue on your current medications as directed. Please refer to the Current Medication list given to you today.  Labwork: None  Testing/Procedures: None  Follow-Up: Your physician wants you to follow-up in: 6 months with [name].  You will receive a reminder letter in the mail two months in advance. If you don't receive a letter, please call our office to schedule the follow-up appointment.   Any Other Special Instructions Will Be Listed Below (If Applicable).     If you need a refill on your cardiac medications before your next appointment, please call your pharmacy.      Signed, Sinclair Grooms, MD  08/09/2017 12:35 PM    [facility] [address], [place], [place]  [address] Phone: [phone]; Fax: [phone]

## 2017-08-16 ENCOUNTER — Ambulatory Visit (HOSPITAL_COMMUNITY)
Admission: RE | Admit: 2017-08-16 | Discharge: 2017-08-16 | Disposition: A | Discharge status: Home / Self Care | Payer: Medicare Other | Source: Ambulatory Visit | Attending: Internal Medicine | Admitting: Internal Medicine

## 2017-08-16 DIAGNOSIS — M810 Age-related osteoporosis without current pathological fracture: Secondary | ICD-10-CM | POA: Diagnosis present

## 2017-08-16 MED ORDER — ZOLEDRONIC ACID 5 MG/100ML IV SOLN
INTRAVENOUS | Status: AC
  Administered 2017-08-16: 5 mg via INTRAVENOUS
  Filled 2017-08-16: qty 100

## 2017-08-16 MED ORDER — ZOLEDRONIC ACID 5 MG/100ML IV SOLN
5.0000 mg | Freq: Once | INTRAVENOUS | Status: AC
  Administered 2017-08-16: 5 mg via INTRAVENOUS

## 2017-08-29 ENCOUNTER — Encounter (INDEPENDENT_AMBULATORY_CARE_PROVIDER_SITE_OTHER): Payer: Self-pay

## 2017-08-29 ENCOUNTER — Ambulatory Visit: Payer: Medicare Other | Admitting: Internal Medicine

## 2017-08-29 ENCOUNTER — Encounter: Payer: Self-pay | Admitting: Internal Medicine

## 2017-08-29 VITALS — BP 120/62 | HR 79 | Ht 64.5 in | Wt 199.4 lb

## 2017-08-29 DIAGNOSIS — Z79899 Other long term (current) drug therapy: Secondary | ICD-10-CM

## 2017-08-29 DIAGNOSIS — I480 Paroxysmal atrial fibrillation: Secondary | ICD-10-CM | POA: Diagnosis not present

## 2017-08-29 NOTE — Patient Instructions (Addendum)

## 2017-08-29 NOTE — Progress Notes (Signed)
HPI [name] returns today for followup of her atrial fib. She was placed on dofetilide about a month ago and she has done well with only a single episode of symptomatic atrial fib. She denies chest pain or sob. She does admit to being anxious. She has occaisional peripheral edema. Allergies  Allergen Reactions  . Zanaflex [Tizanidine] Other (See Comments)    HALLUCINATIONS  . Percocet [Oxycodone-Acetaminophen] Other (See Comments)    Hallucination  . Tramadol Other (See Comments)    Hallucinations.  . Sulfa Antibiotics Itching    Vaginal itching  . Sulfacetamide Sodium Itching    Vaginal itching     Current Outpatient Medications  Medication Sig Dispense Refill  . acetaminophen (TYLENOL) 500 MG tablet Take 500 mg by mouth daily as needed for headache.    [name] apixaban (ELIQUIS) 5 MG TABS tablet Take 5 mg by mouth 2 (two) times daily.    [name] aspirin EC 81 MG tablet Take 81 mg by mouth every Monday, Wednesday, and Friday.    . clonazePAM (KLONOPIN) 0.5 MG tablet Take half (1/2) to one (1) tablet (0.25 mg to 0.5 mg) by mouth twice daily as needed for anxiety.    . Cyanocobalamin 1000 MCG/ML KIT Inject 1 mL into the muscle See admin instructions. Take one (1) injection as directed every other month.    . diltiazem (CARDIZEM CD) 180 MG 24 hr capsule Take 1 capsule (180 mg total) by mouth 2 (two) times daily.    [name] dofetilide (TIKOSYN) 125 MCG capsule Take 1 capsule (125 mcg total) by mouth 2 (two) times daily. 180 capsule 1  . FLUoxetine (PROZAC) 10 MG tablet Take 10 mg by mouth every morning.     . furosemide (LASIX) 20 MG tablet Take 20 mg by mouth as needed for fluid or edema.    . hydrocortisone (CORTEF) 20 MG tablet Take 20 mg by mouth each morning and 10 mg by mouth each evening.    . hyoscyamine (LEVSIN SL) 0.125 MG SL tablet Place 0.125 mg under the tongue daily as needed for cramping (IBS symptoms).    [name] levothyroxine (SYNTHROID, LEVOTHROID) 88 MCG tablet Take 1 tablet (88  mcg total) by mouth daily before breakfast. 30 tablet 0  . Magnesium 250 MG TABS Take 250 mg by mouth daily.    . nitroGLYCERIN (NITROSTAT) 0.4 MG SL tablet Place 0.4 mg under the tongue every 5 (five) minutes as needed for chest pain (3 DOSES MAX FOR CHEST PAIN).    [name] omeprazole (PRILOSEC) 20 MG capsule Take 20 mg by mouth every morning.     . potassium chloride (K-DUR) 10 MEQ tablet Take 2 tablets (20 mEq total) by mouth daily. 60 tablet 3  . PREBIOTIC PRODUCT PO Take 1 capsule by mouth daily.    . Probiotic Product (PROBIOTIC FORMULA PO) Take 1 capsule by mouth every morning. For upset stomach    . valsartan (DIOVAN) 80 MG tablet Take 1 tablet (80 mg total) by mouth daily with supper. 30 tablet 3  . Vitamin D, Ergocalciferol, (DRISDOL) 50000 UNITS CAPS Take 50,000 Units by mouth 2 (two) times a week. Takes on Tuesdays and Fridays     No current facility-administered medications for this visit.      Past Medical History:  Diagnosis Date  . Addison's disease ([place])   . Anxiety   . Arthritis    "everywhere"  . Atrial fibrillation ([place])   . Chronic bronchitis ([place])    "  although I didn't get it this year" (07/23/2017)  . 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  last knee surgery dr. Elmyra Ricks gave large dose of hydrocortisal and avoided the hypotensive side effectas of addison's disease.  . Coronary artery disease   . Depression   . GERD (gastroesophageal reflux disease)   . Hyperlipemia   . Hypertension   . Hypothyroidism   . Lumbar radiculopathy 04/27/2014  . Migraine    "in my 20's" (07/23/2017)  . 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 Recurrent upper respiratory infection (URI)     ROS:   All systems reviewed and negative except as noted in the HPI.   Past Surgical History:  Procedure Laterality Date  . ACHILLES TENDON SURGERY Left 07/16/2014   bone spur removed  . ANTERIOR LUMBAR FUSION  1980   L4-5  . BACK SURGERY    . CARDIAC  CATHETERIZATION  2013  . CARDIAC CATHETERIZATION N/A 09/02/2014   Procedure: Left Heart Cath and Coronary Angiography;  Surgeon: Wellington Hampshire, MD;  Location: [facility];  Service: Cardiovascular;  Laterality: N/A;  . CARDIOVERSION N/A 10/22/2015   Procedure: CARDIOVERSION;  Surgeon: Sanda Klein, MD;  Location: [facility];  Service: Cardiovascular;  Laterality: N/A;  . CARDIOVERSION N/A 06/07/2017   Procedure: CARDIOVERSION;  Surgeon: Larey Dresser, MD;  Location: [facility];  Service: Cardiovascular;  Laterality: N/A;  . CARDIOVERSION N/A 07/05/2017   Procedure: CARDIOVERSION;  Surgeon: Pixie Casino, MD;  Location: [facility];  Service: Cardiovascular;  Laterality: N/A;  . CARPAL TUNNEL RELEASE Right   . CATARACT EXTRACTION, BILATERAL Bilateral   . CORONARY ARTERY BYPASS GRAFT  06/26/2011   Procedure: CORONARY ARTERY BYPASS GRAFTING (CABG);  Surgeon: Melrose Nakayama, MD;  Location: [facility];  Service: Open Heart Surgery;  Laterality: N/A;  times using Greater Saphenous Vein Graft harvested endoscopically from right leg  . DIAGNOSTIC LAPAROSCOPY    . DILATION AND CURETTAGE OF UTERUS    . JOINT REPLACEMENT    . KNEE ARTHROSCOPY Bilateral   . LAPAROSCOPIC CHOLECYSTECTOMY    . REVISION TOTAL KNEE ARTHROPLASTY Right   . SPINAL CORD STIMULATOR IMPLANT  12/10/15  . TONSILLECTOMY    . TOTAL KNEE ARTHROPLASTY Bilateral      Family History  Problem Relation Age of Onset  . Heart attack Father   . Hypertension Mother   . Stroke Mother   . Hypertension Maternal Grandfather   . Colon cancer Neg Hx      Social History   Socioeconomic History  . Marital status: Widowed    Spouse name: Not on file  . Number of children: Not on file  . Years of education: Not on file  . Highest education level: Not on file  Occupational History  . Occupation: Retired  Scientific laboratory technician  . Financial resource strain: Not on file  . Food insecurity:    Worry: Not on file    Inability: Not  on file  . Transportation needs:    Medical: Not on file    Non-medical: Not on file  Tobacco Use  . Smoking status: Passive Smoke Exposure - Never Smoker  . Smokeless tobacco: Never Used  . Tobacco comment: "husband passed in 2010; father passed 1978"  Substance and Sexual Activity  . Alcohol use: No    Alcohol/week: 0.0 oz  . Drug use: No  . Sexual activity: Not Currently    Birth control/protection: Post-menopausal  Lifestyle  .  Physical activity:    Days per week: Not on file    Minutes per session: Not on file  . Stress: Not on file  Relationships  . Social connections:    Talks on phone: Not on file    Gets together: Not on file    Attends religious service: Not on file    Active member of club or organization: Not on file    Attends meetings of clubs or organizations: Not on file    Relationship status: Not on file  . Intimate partner violence:    Fear of current or ex partner: Not on file    Emotionally abused: Not on file    Physically abused: Not on file    Forced sexual activity: Not on file  Other Topics Concern  . Not on file  Social History Narrative  . Not on file     BP 120/62   Pulse 79   Ht 5' 4.5" (1.638 m)   Wt 199 lb 6.4 oz (90.4 kg)   SpO2 93%   BMI 33.70 kg/m   Physical Exam:  Well appearing 77 yo woman, NAD HEENT: Unremarkable Neck:  6 cm JVD, no thyromegally Lymphatics:  No adenopathy Back:  No CVA tenderness Lungs:  Clear with no wheezes HEART:  Regular rate rhythm, no murmurs, no rubs, no clicks Abd:  soft, positive bowel sounds, no organomegally, no rebound, no guarding Ext:  2 plus pulses, no edema, no cyanosis, no clubbing Skin:  No rashes no nodules Neuro:  CN II through XII intact, motor grossly intact  EKG - NSR with RBBB  Assess/Plan: 1. PAF - she is maintaining NSR very nicely on dofetilide. She will continue. 2. HTN - Her blood pressure is well controlled. Will follow. 3. Obesity - I have encouraged her to start  exercising and lose weight. 4. CAD - she is s/p CABG and she denies any anginal symptoms. We will follow. She will call if she develops chest pressure.  Mikle Bosworth.D.

## 2017-09-06 ENCOUNTER — Other Ambulatory Visit (HOSPITAL_COMMUNITY): Payer: Self-pay | Admitting: Chiropractic Medicine

## 2017-09-06 DIAGNOSIS — M5417 Radiculopathy, lumbosacral region: Secondary | ICD-10-CM

## 2017-09-07 ENCOUNTER — Ambulatory Visit (HOSPITAL_COMMUNITY)
Admission: RE | Admit: 2017-09-07 | Discharge: 2017-09-07 | Disposition: A | Discharge status: Home / Self Care | Payer: Medicare Other | Source: Ambulatory Visit | Attending: Chiropractic Medicine | Admitting: Chiropractic Medicine

## 2017-09-07 DIAGNOSIS — M5116 Intervertebral disc disorders with radiculopathy, lumbar region: Secondary | ICD-10-CM | POA: Insufficient documentation

## 2017-09-07 DIAGNOSIS — M5416 Radiculopathy, lumbar region: Secondary | ICD-10-CM | POA: Diagnosis present

## 2017-09-07 DIAGNOSIS — M48061 Spinal stenosis, lumbar region without neurogenic claudication: Secondary | ICD-10-CM | POA: Diagnosis not present

## 2017-09-07 DIAGNOSIS — M5417 Radiculopathy, lumbosacral region: Secondary | ICD-10-CM

## 2017-09-07 MED ORDER — GADOBENATE DIMEGLUMINE 529 MG/ML IV SOLN
20.0000 mL | Freq: Once | INTRAVENOUS | Status: AC
  Administered 2017-09-07: 20 mL via INTRAVENOUS

## 2017-09-08 ENCOUNTER — Other Ambulatory Visit: Payer: Self-pay | Admitting: Chiropractic Medicine

## 2017-09-08 DIAGNOSIS — M5416 Radiculopathy, lumbar region: Secondary | ICD-10-CM

## 2017-09-10 ENCOUNTER — Encounter: Payer: Self-pay | Admitting: Gastroenterology

## 2017-09-17 ENCOUNTER — Other Ambulatory Visit: Payer: Medicare Other

## 2017-09-24 ENCOUNTER — Emergency Department (HOSPITAL_COMMUNITY): Payer: Medicare Other

## 2017-09-24 ENCOUNTER — Encounter (HOSPITAL_COMMUNITY): Payer: Self-pay | Admitting: Emergency Medicine

## 2017-09-24 ENCOUNTER — Observation Stay (HOSPITAL_COMMUNITY)
Admission: EM | Admit: 2017-09-24 | Discharge: 2017-09-25 | Disposition: A | Discharge status: Home / Self Care | Payer: Medicare Other | Attending: Internal Medicine | Admitting: Internal Medicine

## 2017-09-24 ENCOUNTER — Other Ambulatory Visit: Payer: Self-pay

## 2017-09-24 DIAGNOSIS — Z7901 Long term (current) use of anticoagulants: Secondary | ICD-10-CM

## 2017-09-24 DIAGNOSIS — I10 Essential (primary) hypertension: Secondary | ICD-10-CM | POA: Diagnosis present

## 2017-09-24 DIAGNOSIS — Z79899 Other long term (current) drug therapy: Secondary | ICD-10-CM | POA: Insufficient documentation

## 2017-09-24 DIAGNOSIS — I4891 Unspecified atrial fibrillation: Secondary | ICD-10-CM | POA: Diagnosis present

## 2017-09-24 DIAGNOSIS — E271 Primary adrenocortical insufficiency: Secondary | ICD-10-CM | POA: Diagnosis present

## 2017-09-24 DIAGNOSIS — F419 Anxiety disorder, unspecified: Secondary | ICD-10-CM | POA: Diagnosis not present

## 2017-09-24 DIAGNOSIS — Z9049 Acquired absence of other specified parts of digestive tract: Secondary | ICD-10-CM | POA: Insufficient documentation

## 2017-09-24 DIAGNOSIS — I2510 Atherosclerotic heart disease of native coronary artery without angina pectoris: Secondary | ICD-10-CM | POA: Diagnosis not present

## 2017-09-24 DIAGNOSIS — E039 Hypothyroidism, unspecified: Secondary | ICD-10-CM | POA: Diagnosis not present

## 2017-09-24 DIAGNOSIS — Z7982 Long term (current) use of aspirin: Secondary | ICD-10-CM | POA: Insufficient documentation

## 2017-09-24 DIAGNOSIS — Z951 Presence of aortocoronary bypass graft: Secondary | ICD-10-CM | POA: Diagnosis not present

## 2017-09-24 DIAGNOSIS — R42 Dizziness and giddiness: Secondary | ICD-10-CM | POA: Diagnosis present

## 2017-09-24 DIAGNOSIS — R739 Hyperglycemia, unspecified: Secondary | ICD-10-CM

## 2017-09-24 DIAGNOSIS — Z96653 Presence of artificial knee joint, bilateral: Secondary | ICD-10-CM | POA: Insufficient documentation

## 2017-09-24 DIAGNOSIS — I951 Orthostatic hypotension: Principal | ICD-10-CM | POA: Diagnosis present

## 2017-09-24 DIAGNOSIS — Z7722 Contact with and (suspected) exposure to environmental tobacco smoke (acute) (chronic): Secondary | ICD-10-CM | POA: Insufficient documentation

## 2017-09-24 DIAGNOSIS — F329 Major depressive disorder, single episode, unspecified: Secondary | ICD-10-CM | POA: Insufficient documentation

## 2017-09-24 LAB — CBC
HCT: 43 % (ref 36.0–46.0)
Hemoglobin: 13.7 g/dL (ref 12.0–15.0)
MCH: 29.6 pg (ref 26.0–34.0)
MCHC: 31.9 g/dL (ref 30.0–36.0)
MCV: 92.9 fL (ref 78.0–100.0)
PLATELETS: 233 10*3/uL (ref 150–400)
RBC: 4.63 MIL/uL (ref 3.87–5.11)
RDW: 13.4 % (ref 11.5–15.5)
WBC: 10.4 10*3/uL (ref 4.0–10.5)

## 2017-09-24 LAB — BASIC METABOLIC PANEL
Anion gap: 9 (ref 5–15)
BUN: 19 mg/dL (ref 6–20)
CALCIUM: 8.4 mg/dL — AB (ref 8.9–10.3)
CO2: 26 mmol/L (ref 22–32)
CREATININE: 1.13 mg/dL — AB (ref 0.44–1.00)
Chloride: 103 mmol/L (ref 101–111)
GFR calc non Af Amer: 45 mL/min — ABNORMAL LOW (ref >60)
GFR, EST AFRICAN AMERICAN: 53 mL/min — AB (ref >60)
Glucose, Bld: 158 mg/dL — ABNORMAL HIGH (ref 65–99)
Potassium: 3.9 mmol/L (ref 3.5–5.1)
SODIUM: 138 mmol/L (ref 135–145)

## 2017-09-24 LAB — PROTIME-INR
INR: 1.35
Prothrombin Time: 16.6 seconds — ABNORMAL HIGH (ref 11.4–15.2)

## 2017-09-24 LAB — CBG MONITORING, ED: GLUCOSE-CAPILLARY: 238 mg/dL — AB (ref 65–99)

## 2017-09-24 MED ORDER — MAGNESIUM 200 MG PO TABS
500.0000 mg | ORAL_TABLET | Freq: Every day | ORAL | Status: DC
  Filled 2017-09-24: qty 3

## 2017-09-24 MED ORDER — CLONAZEPAM 0.25 MG PO TBDP: 0.2500 mg | ORAL_TABLET | Freq: Two times a day (BID) | ORAL | Status: DC | PRN

## 2017-09-24 MED ORDER — ONDANSETRON HCL 4 MG PO TABS: 4.0000 mg | ORAL_TABLET | Freq: Four times a day (QID) | ORAL | Status: DC | PRN

## 2017-09-24 MED ORDER — FLUOXETINE HCL 20 MG PO TABS
10.0000 mg | ORAL_TABLET | Freq: Every morning | ORAL | Status: DC
  Filled 2017-09-24: qty 1

## 2017-09-24 MED ORDER — HYDROCORTISONE NA SUCCINATE PF 100 MG IJ SOLR
50.0000 mg | Freq: Four times a day (QID) | INTRAMUSCULAR | Status: DC
  Administered 2017-09-24 – 2017-09-25 (×3): 50 mg via INTRAVENOUS
  Filled 2017-09-24 (×3): qty 2

## 2017-09-24 MED ORDER — SODIUM CHLORIDE 0.9 % IV BOLUS
500.0000 mL | Freq: Once | INTRAVENOUS | Status: AC
  Administered 2017-09-24: 500 mL via INTRAVENOUS

## 2017-09-24 MED ORDER — LACTATED RINGERS IV SOLN
INTRAVENOUS | Status: DC
  Administered 2017-09-24 – 2017-09-25 (×2): via INTRAVENOUS

## 2017-09-24 MED ORDER — HYDROCORTISONE NA SUCCINATE PF 100 MG IJ SOLR
100.0000 mg | Freq: Once | INTRAMUSCULAR | Status: AC
  Administered 2017-09-24: 100 mg via INTRAVENOUS
  Filled 2017-09-24: qty 2

## 2017-09-24 MED ORDER — ONDANSETRON HCL 4 MG/2ML IJ SOLN: 4.0000 mg | Freq: Four times a day (QID) | INTRAMUSCULAR | Status: DC | PRN

## 2017-09-24 MED ORDER — SODIUM CHLORIDE 0.9% FLUSH
3.0000 mL | Freq: Two times a day (BID) | INTRAVENOUS | Status: DC
  Administered 2017-09-25: 3 mL via INTRAVENOUS

## 2017-09-24 MED ORDER — ACETAMINOPHEN 325 MG PO TABS: 650.0000 mg | ORAL_TABLET | Freq: Four times a day (QID) | ORAL | Status: DC | PRN

## 2017-09-24 MED ORDER — DOFETILIDE 125 MCG PO CAPS
125.0000 ug | ORAL_CAPSULE | Freq: Two times a day (BID) | ORAL | Status: DC
  Administered 2017-09-24 – 2017-09-25 (×2): 125 ug via ORAL
  Filled 2017-09-24 (×2): qty 1

## 2017-09-24 MED ORDER — ACETAMINOPHEN 650 MG RE SUPP: 650.0000 mg | Freq: Four times a day (QID) | RECTAL | Status: DC | PRN

## 2017-09-24 MED ORDER — APIXABAN 5 MG PO TABS
5.0000 mg | ORAL_TABLET | Freq: Two times a day (BID) | ORAL | Status: DC
  Administered 2017-09-24 – 2017-09-25 (×2): 5 mg via ORAL
  Filled 2017-09-24 (×2): qty 1

## 2017-09-24 MED ORDER — DILTIAZEM HCL ER COATED BEADS 180 MG PO CP24
180.0000 mg | ORAL_CAPSULE | Freq: Every day | ORAL | Status: DC
Start: 2017-09-24 — End: 2017-09-25
  Administered 2017-09-24: 180 mg via ORAL
  Filled 2017-09-24: qty 1

## 2017-09-24 MED ORDER — LEVOTHYROXINE SODIUM 88 MCG PO TABS
88.0000 ug | ORAL_TABLET | Freq: Every day | ORAL | Status: DC
  Administered 2017-09-25: 88 ug via ORAL
  Filled 2017-09-24: qty 1

## 2017-09-24 MED ORDER — POTASSIUM CHLORIDE ER 10 MEQ PO TBCR
20.0000 meq | EXTENDED_RELEASE_TABLET | Freq: Every day | ORAL | Status: DC
  Administered 2017-09-24: 20 meq via ORAL
  Filled 2017-09-24 (×2): qty 2

## 2017-09-24 MED ORDER — PANTOPRAZOLE SODIUM 40 MG PO TBEC
40.0000 mg | DELAYED_RELEASE_TABLET | Freq: Every day | ORAL | Status: DC
  Administered 2017-09-25: 40 mg via ORAL
  Filled 2017-09-24: qty 1

## 2017-09-24 MED ORDER — ASPIRIN EC 81 MG PO TBEC: 81.0000 mg | DELAYED_RELEASE_TABLET | ORAL | Status: DC

## 2017-09-24 NOTE — H&P (Addendum)
History and Physical    [name] WRU:[id number] DOB: 30-May-1940 DOA: 09/24/2017  PCP: Crist Infante, MD Consultants:  Tamala Julian - cardiolgoy; Lovena Le - EP Patient coming from:  Home - lives alone; NOK: Son, [phone]  Chief Complaint:  Pre-syncope  HPI: [name] is a 77 y.o. female with medical history significant of hypothyroidism; HTN; HLD: CAD; Addison's diease; and afib presenting with weakness.  When she got up this AM at 8, she was very dizzy and felt like she would pass out.  She didn't feel well yesterday either.  By about 930, a caretaker was concerned and encouraged her to push her LifeAlert.  She tried to sit up to go to the bathroom and felt like she would pass out any minute.   She laid down in the floor.  Her BP was very low, diastolic 30 this AM.  She has always had HTN and takes medication for this.  After her heart went into chronic afib and she was getting shocked every other month (4 shocks in April), she was started on Tikosyn and that has worked great at keeping it in rhythm.  Yesterday, she felt general malaise, no energy or stamina.  Mild SOB with walking up/down her driveway.  Saturday was uneventful but it was a somewhat busy day - she had company, and she was overseeing folks working in her yard.  She does not drink a lot of water, but she does drink half-caf tea and sodas.  Normal breakfast yesterday AM; maybe missed lunch; made breakfast for dinner.   ED Course:  Hypotensive with position changes - dropping down into the 70s.  She has panhypopit, takes daily steroids.  Also on several BP meds without recent change or missed doses.  IVF boluses, given stress dose steroids.  UA is pending.    Review of Systems: As per HPI; otherwise review of systems reviewed and negative.   Ambulatory Status:  Ambulates without assistance  Past Medical History:  Diagnosis Date  . Addison's disease ([place])   . Anxiety   . Arthritis    "everywhere"  . Atrial fibrillation  (HCC)    Eliquis  . Chronic bronchitis ([place])    "although I didn't get it this year" (07/23/2017)  . 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  last knee surgery dr. Elmyra Ricks gave large dose of hydrocortisal and avoided the hypotensive side effectas of addison's disease.  . Coronary artery disease   . Depression   . GERD (gastroesophageal reflux disease)   . Hyperlipemia   . Hypertension   . Hypothyroidism   . Lumbar radiculopathy 04/27/2014  . Migraine    "in my 20's" (07/23/2017)  . Recurrent upper respiratory infection (URI)     Past Surgical History:  Procedure Laterality Date  . ACHILLES TENDON SURGERY Left 07/16/2014   bone spur removed  . ANTERIOR LUMBAR FUSION  1980   L4-5  . BACK SURGERY    . CARDIAC CATHETERIZATION  2013  . CARDIAC CATHETERIZATION N/A 09/02/2014   Procedure: Left Heart Cath and Coronary Angiography;  Surgeon: Wellington Hampshire, MD;  Location: [facility];  Service: Cardiovascular;  Laterality: N/A;  . CARDIOVERSION N/A 10/22/2015   Procedure: CARDIOVERSION;  Surgeon: Sanda Klein, MD;  Location: [facility];  Service: Cardiovascular;  Laterality: N/A;  . CARDIOVERSION N/A 06/07/2017   Procedure: CARDIOVERSION;  Surgeon: Larey Dresser, MD;  Location: [facility];  Service: Cardiovascular;  Laterality: N/A;  .  CARDIOVERSION N/A 07/05/2017   Procedure: CARDIOVERSION;  Surgeon: Pixie Casino, MD;  Location: [facility];  Service: Cardiovascular;  Laterality: N/A;  . CARPAL TUNNEL RELEASE Right   . CATARACT EXTRACTION, BILATERAL Bilateral   . CORONARY ARTERY BYPASS GRAFT  06/26/2011   Procedure: CORONARY ARTERY BYPASS GRAFTING (CABG);  Surgeon: Melrose Nakayama, MD;  Location: [facility];  Service: Open Heart Surgery;  Laterality: N/A;  times using Greater Saphenous Vein Graft harvested endoscopically from right leg  . DIAGNOSTIC LAPAROSCOPY    . DILATION AND CURETTAGE OF UTERUS    . JOINT REPLACEMENT    .  KNEE ARTHROSCOPY Bilateral   . LAPAROSCOPIC CHOLECYSTECTOMY    . REVISION TOTAL KNEE ARTHROPLASTY Right   . SPINAL CORD STIMULATOR IMPLANT  12/10/15  . TONSILLECTOMY    . TOTAL KNEE ARTHROPLASTY Bilateral     Social History   Socioeconomic History  . Marital status: Widowed    Spouse name: Not on file  . Number of children: Not on file  . Years of education: Not on file  . Highest education level: Not on file  Occupational History  . Occupation: Retired  Scientific laboratory technician  . Financial resource strain: Not on file  . Food insecurity:    Worry: Not on file    Inability: Not on file  . Transportation needs:    Medical: Not on file    Non-medical: Not on file  Tobacco Use  . Smoking status: Passive Smoke Exposure - Never Smoker  . Smokeless tobacco: Never Used  . Tobacco comment: "husband passed in 2010; father passed 1978"  Substance and Sexual Activity  . Alcohol use: Yes    Alcohol/week: 0.0 oz    Comment: glass of wine occasionally   . Drug use: No  . Sexual activity: Not Currently    Birth control/protection: Post-menopausal  Lifestyle  . Physical activity:    Days per week: Not on file    Minutes per session: Not on file  . Stress: Not on file  Relationships  . Social connections:    Talks on phone: Not on file    Gets together: Not on file    Attends religious service: Not on file    Active member of club or organization: Not on file    Attends meetings of clubs or organizations: Not on file    Relationship status: Not on file  . Intimate partner violence:    Fear of current or ex partner: Not on file    Emotionally abused: Not on file    Physically abused: Not on file    Forced sexual activity: Not on file  Other Topics Concern  . Not on file  Social History Narrative  . Not on file    Allergies  Allergen Reactions  . Zanaflex [Tizanidine] Other (See Comments)    HALLUCINATIONS  . Percocet [Oxycodone-Acetaminophen] Other (See Comments)    Hallucination    . Tramadol Other (See Comments)    Hallucinations.  . Sulfa Antibiotics Itching    Vaginal itching  . Sulfacetamide Sodium Itching    Vaginal itching    Family History  Problem Relation Age of Onset  . Heart attack Father   . Hypertension Mother   . Stroke Mother   . Hypertension Maternal Grandfather   . Colon cancer Neg Hx     Prior to Admission medications   Medication Sig Start Date End Date Taking? Authorizing Provider  acetaminophen (TYLENOL) 500 MG tablet Take 500 mg by  mouth daily as needed for headache.    [provider]  apixaban (ELIQUIS) 5 MG TABS tablet Take 5 mg by mouth 2 (two) times daily.    [provider]  aspirin EC 81 MG tablet Take 81 mg by mouth every Monday, Wednesday, and Friday.    [provider]  clonazePAM (KLONOPIN) 0.5 MG tablet Take half (1/2) to one (1) tablet (0.25 mg to 0.5 mg) by mouth twice daily as needed for anxiety.    [provider]  Cyanocobalamin 1000 MCG/ML KIT Inject 1 mL into the muscle See admin instructions. Take one (1) injection as directed every other month.    [provider]  diltiazem (CARDIZEM CD) 180 MG 24 hr capsule Take 1 capsule (180 mg total) by mouth 2 (two) times daily. 07/19/17   Sherran Needs, NP  dofetilide (TIKOSYN) 125 MCG capsule Take 1 capsule (125 mcg total) by mouth 2 (two) times daily. 07/27/17   Chanetta Marshall K, NP  FLUoxetine (PROZAC) 10 MG tablet Take 10 mg by mouth every morning.     [provider]  furosemide (LASIX) 20 MG tablet Take 20 mg by mouth as needed for fluid or edema.    [provider]  hydrocortisone (CORTEF) 20 MG tablet Take 20 mg by mouth each morning and 10 mg by mouth each evening.    [provider]  hyoscyamine (LEVSIN SL) 0.125 MG SL tablet Place 0.125 mg under the tongue daily as needed for cramping (IBS symptoms).    [provider]  levothyroxine (SYNTHROID, LEVOTHROID) 88 MCG tablet Take 1 tablet (88  mcg total) by mouth daily before breakfast. 03/25/16   Elgergawy, Silver Huguenin, MD  Magnesium 250 MG TABS Take 250 mg by mouth daily.    [provider]  nitroGLYCERIN (NITROSTAT) 0.4 MG SL tablet Place 0.4 mg under the tongue every 5 (five) minutes as needed for chest pain (3 DOSES MAX FOR CHEST PAIN).    [provider]  omeprazole (PRILOSEC) 20 MG capsule Take 20 mg by mouth every morning.  11/19/13   [provider]  potassium chloride (K-DUR) 10 MEQ tablet Take 2 tablets (20 mEq total) by mouth daily. 07/06/17   Erlene Quan, PA-C  PREBIOTIC PRODUCT PO Take 1 capsule by mouth daily.    [provider]  Probiotic Product (PROBIOTIC FORMULA PO) Take 1 capsule by mouth every morning. For upset stomach    [provider]  valsartan (DIOVAN) 80 MG tablet Take 1 tablet (80 mg total) by mouth daily with supper. 08/02/17   Sherran Needs, NP  Vitamin D, Ergocalciferol, (DRISDOL) 50000 UNITS CAPS Take 50,000 Units by mouth 2 (two) times a week. Takes on Tuesdays and Fridays    [provider]    Physical Exam: Vitals:   09/24/17 1600 09/24/17 1615 09/24/17 1630 09/24/17 1718  BP: 110/72 102/69 107/68 119/71  Pulse: 84 78 76 77  Resp: 19 15 (!) 22 19  Temp:      TempSrc:      SpO2: 97% 96% 96% 95%  Weight:      Height:         General:  Appears calm and comfortable and is NAD Eyes:  PERRL (left anisocoria - she is planning to have cataract surgery on that side), EOMI, normal lids, iris ENT:  grossly normal hearing, lips & tongue, mmm Neck:  no LAD, masses or thyromegaly; no carotid bruits Cardiovascular:  RRR, no m/r/g. No  LE edema.  Respiratory:   CTA bilaterally with no wheezes/rales/rhonchi.  Normal respiratory effort. Abdomen:  soft, NT, ND, NABS Back:   normal alignment, no CVAT Skin:  no rash or induration seen on limited exam Musculoskeletal:  grossly normal tone BUE/BLE, good ROM, no bony abnormality Lower extremity:  No LE edema.   Limited foot exam with no ulcerations.  2+ distal pulses. Psychiatric:  grossly normal mood and affect, speech fluent and appropriate, AOx3 Neurologic:  CN 2-12 grossly intact, moves all extremities in coordinated fashion, sensation intact    Radiological Exams on Admission: Dg Chest Port 1 View  Result Date: 09/24/2017 CLINICAL DATA:  Decreased blood pressure, lightheadedness today. History of hypertension, atrial fibrillation, Addison's disease. EXAM: PORTABLE CHEST 1 VIEW COMPARISON:  Chest x-rays dated 03/22/2016 and 02/02/2016. FINDINGS: Mild cardiomegaly is stable. Overall cardiomediastinal silhouette is stable. Median sternotomy wires appear intact and stable in alignment. Neural stimulator hardware appears stable in position near the midline of the mid/upper thoracic spine. Lungs are clear. No pleural effusion or pneumothorax seen. No acute or suspicious osseous finding. IMPRESSION: No active disease.  No evidence of pneumonia or pulmonary edema. Electronically Signed   By: Franki Cabot M.D.   On: 09/24/2017 15:53    EKG: Independently reviewed.  NSR with rate 80; incomplete RBBB; nonspecific ST changes with no evidence of acute ischemia   Labs on Admission: I have personally reviewed the available labs and imaging studies at the time of the admission.  Pertinent labs:   Glucose 158 BUN 19/Creatinine 1.13/GFR 45; 21/1.14/45 on 5/2 Normal CBC INR 1.35  Assessment/Plan Principal Problem:   Orthostatic hypotension Active Problems:   Essential hypertension   Addison disease (HCC)   Atrial fibrillation (HCC)   Chronic anticoagulation   Hypothyroidism   Orthostatic hypotension -Patient is presenting with hypotension, particularly orthostatic hypotension -Most likely etiology is dehydration, although she is on chronic steroids and this is a likely contributing factor -She was given IVF in the ER and is already feeling better -Will monitor on telemetry -Orthostatic vital  signs on admission and in AM -Neuro checks  -PT eval and treat  Addison disease -She is on chronic daily steroids -She was given 100 mg IV hydrocortisone in the ER as stress-dosed steroids -Will continue with hydrocortisone 50 mg IV q6h -There is no apparent reason for her to have developed adrenal crisis, but certainly this could be contributing  Afib on [facility] -She is rate controlled on Tikosyn and Cardizem - will continue both -Continue Eliquis -Per pharmacy, goal K+ while on Tikosyn is >4.0 so PO KCl will be continued -Will monitor Mag as recommended by pharmacy  HTN -Continue Cardizem and Tikosyn as above -Hold Valsartan for now  Hypothyroidism -Check TSH -Continue Synthroid at current dose for now   DVT prophylaxis: Eliquis Code Status:  Full code - confirmed with patient Family Communication: None present Disposition Plan:  Home once clinically improved Consults called: PT  Admission status: It is my clinical opinion that referral for OBSERVATION is reasonable and necessary in this patient based on the above information provided. The aforementioned taken together are felt to place the patient at high risk for further clinical deterioration. However it is anticipated that the patient may be medically stable for discharge from the hospital within 24 to 48 hours.    Karmen Bongo MD [facility]  If note is complete, please contact covering daytime or nighttime physician. [email or web address] Password [email or web address]  09/24/2017, 6:01 PM

## 2017-09-24 NOTE — ED Notes (Signed)
Attempted to call report

## 2017-09-24 NOTE — Plan of Care (Signed)
  Problem: Activity: Goal: Risk for activity intolerance will decrease Outcome: Progressing   Problem: Education: Goal: Knowledge of General Education information will improve Outcome: Completed/Met   Problem: Nutrition: Goal: Adequate nutrition will be maintained Outcome: Completed/Met   Problem: Coping: Goal: Level of anxiety will decrease Outcome: Completed/Met

## 2017-09-24 NOTE — ED Triage Notes (Signed)
[facility]-with near syncope and hypotension. Pt reports not feeling well yesterday and today upon sitting up she was really dizzy. She denies falls, had 3 BMs with no blood noted and RLQ abdominal pain that has now subsided. Taking blood thinner for afib.   BP on palp 80's. She received 300 cc of fluid in route. Vitals stable.

## 2017-09-24 NOTE — ED Notes (Signed)
Pt was unable to provide UA sample after sitting on bedpad for approximately 30 mins

## 2017-09-24 NOTE — ED Notes (Signed)
ED Provider at bedside. 

## 2017-09-24 NOTE — ED Notes (Signed)
Bedside report given

## 2017-09-24 NOTE — ED Provider Notes (Signed)
[facility] EMERGENCY DEPARTMENT Provider Note   CSN: [id number] Arrival date & time: 09/24/17  1052     History   Chief Complaint Chief Complaint  Patient presents with  . Near Syncope  . Hypotension    HPI [name] is a 77 y.o. female.  HPI Patient presents with lightheadedness and near syncope.  Lightheadedness is worse with sitting or standing up.  States he had mild symptoms last night but worsened this morning.  She is taking her medications as prescribed.  No recent changes.  She also has had 3 bowel movements this morning but states they were not liquid.  She has had some lower abdominal cramping but denies any currently.  Denies urinary symptoms including frequency, urgency or dysuria.  Patient is on Eliquis for atrial fibrillation.  Denies chest pain, shortness of breath. Past Medical History:  Diagnosis Date  . Addison's disease (HCC)   . Anxiety   . Arthritis    "everywhere"  . Atrial fibrillation (HCC)   . Chronic bronchitis (HCC)    "although I didn't get it this year" (07/23/2017)  . 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  last knee surgery dr. allusio gave large dose of hydrocortisal and avoided the hypotensive side effectas of addison's disease.  . Coronary artery disease   . Depression   . GERD (gastroesophageal reflux disease)   . Hyperlipemia   . Hypertension   . Hypothyroidism   . Lumbar radiculopathy 04/27/2014  . Migraine    "in my 20's" (07/23/2017)  . Recurrent upper respiratory infection (URI)     Patient Active Problem List   Diagnosis Date Noted  . Visit for monitoring Tikosyn therapy 07/23/2017  . Coronary artery disease 06/15/2017  . Depression 06/15/2017  . Acute respiratory failure with hypoxia (HCC)   . Hypothyroidism   . Bronchitis 03/22/2016  . Abnormal CT of the chest 03/16/2016  . 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 Chronic anticoagulation 10/06/2015  . On dofetilide therapy 10/06/2015  . Atrial fibrillation (HCC) 09/15/2015  . Snoring 02/17/2015  . Postoperative examination 12/22/2014  . Chronic low back pain 11/09/2014  . Disc degeneration, lumbar 11/09/2014  . Addison disease (HCC) 09/30/2014  . Chronic back pain 09/30/2014  . Hematochezia-(Hgb stable) 09/03/2014  . DOE (dyspnea on exertion) 09/01/2014  . Lumbar radiculopathy 04/27/2014  . UTI (lower urinary tract infection) 07/09/2011  . Nausea and vomiting 07/08/2011  . Osteoarthritis 11/18/2008  . Hyperlipidemia with target LDL less than 70 11/17/2008  . Essential hypertension 11/17/2008    Past Surgical History:  Procedure Laterality Date  . ACHILLES TENDON SURGERY Left 07/16/2014   bone spur removed  . ANTERIOR LUMBAR FUSION  1980   L4-5  . BACK SURGERY    . CARDIAC CATHETERIZATION  2013  . CARDIAC CATHETERIZATION N/A 09/02/2014   Procedure: Left Heart Cath and Coronary Angiography;  Surgeon: Muhammad A Arida, MD;  Location: [facility] INVASIVE CV LAB;  Service: Cardiovascular;  Laterality: N/A;  . CARDIOVERSION N/A 10/22/2015   Procedure: CARDIOVERSION;  Surgeon: Mihai Croitoru, MD;  Location: [facility] ENDOSCOPY;  Service: Cardiovascular;  Laterality: N/A;  . CARDIOVERSION N/A 06/07/2017   Procedure: CARDIOVERSION;  Surgeon: McLean, Dalton S, MD;  Location: [facility] ENDOSCOPY;  Service: Cardiovascular;  Laterality: N/A;  . CARDIOVERSION N/A 07/05/2017   Procedure: CARDIOVERSION;  Surgeon: Hilty, Kenneth C, MD;  Location: [facility] ENDOSCOPY;    Service: Cardiovascular;  Laterality: N/A;  . CARPAL TUNNEL RELEASE Right   . CATARACT EXTRACTION, BILATERAL Bilateral   . CORONARY ARTERY BYPASS GRAFT  06/26/2011   Procedure: CORONARY ARTERY BYPASS GRAFTING (CABG);  Surgeon: Melrose Nakayama, MD;  Location: [facility];  Service: Open Heart Surgery;  Laterality: N/A;  times using Greater Saphenous Vein Graft harvested endoscopically from  right leg  . DIAGNOSTIC LAPAROSCOPY    . DILATION AND CURETTAGE OF UTERUS    . JOINT REPLACEMENT    . KNEE ARTHROSCOPY Bilateral   . LAPAROSCOPIC CHOLECYSTECTOMY    . REVISION TOTAL KNEE ARTHROPLASTY Right   . SPINAL CORD STIMULATOR IMPLANT  12/10/15  . TONSILLECTOMY    . TOTAL KNEE ARTHROPLASTY Bilateral      OB History   None      Home Medications    Prior to Admission medications   Medication Sig Start Date End Date Taking? Authorizing Provider  acetaminophen (TYLENOL) 500 MG tablet Take 500 mg by mouth daily as needed for headache.    [provider]  apixaban (ELIQUIS) 5 MG TABS tablet Take 5 mg by mouth 2 (two) times daily.    [provider]  aspirin EC 81 MG tablet Take 81 mg by mouth every Monday, Wednesday, and Friday.    [provider]  clonazePAM (KLONOPIN) 0.5 MG tablet Take half (1/2) to one (1) tablet (0.25 mg to 0.5 mg) by mouth twice daily as needed for anxiety.    [provider]  Cyanocobalamin 1000 MCG/ML KIT Inject 1 mL into the muscle See admin instructions. Take one (1) injection as directed every other month.    [provider]  diltiazem (CARDIZEM CD) 180 MG 24 hr capsule Take 1 capsule (180 mg total) by mouth 2 (two) times daily. 07/19/17   Sherran Needs, NP  dofetilide (TIKOSYN) 125 MCG capsule Take 1 capsule (125 mcg total) by mouth 2 (two) times daily. 07/27/17   Chanetta Marshall K, NP  FLUoxetine (PROZAC) 10 MG tablet Take 10 mg by mouth every morning.     [provider]  furosemide (LASIX) 20 MG tablet Take 20 mg by mouth as needed for fluid or edema.    [provider]  hydrocortisone (CORTEF) 20 MG tablet Take 20 mg by mouth each morning and 10 mg by mouth each evening.    [provider]  hyoscyamine (LEVSIN SL) 0.125 MG SL tablet Place 0.125 mg under the tongue daily as needed for cramping (IBS symptoms).    [provider]  levothyroxine (SYNTHROID, LEVOTHROID) 88 MCG  tablet Take 1 tablet (88 mcg total) by mouth daily before breakfast. 03/25/16   Elgergawy, Silver Huguenin, MD  Magnesium 250 MG TABS Take 250 mg by mouth daily.    [provider]  nitroGLYCERIN (NITROSTAT) 0.4 MG SL tablet Place 0.4 mg under the tongue every 5 (five) minutes as needed for chest pain (3 DOSES MAX FOR CHEST PAIN).    [provider]  omeprazole (PRILOSEC) 20 MG capsule Take 20 mg by mouth every morning.  11/19/13   [provider]  potassium chloride (K-DUR) 10 MEQ tablet Take 2 tablets (20 mEq total) by mouth daily. 07/06/17   Erlene Quan, PA-C  PREBIOTIC PRODUCT PO Take 1 capsule by mouth daily.    [provider]  Probiotic Product (PROBIOTIC FORMULA PO) Take 1 capsule by mouth every morning. For upset stomach    [provider]  valsartan (DIOVAN) 80 MG  tablet Take 1 tablet (80 mg total) by mouth daily with supper. 08/02/17   Carroll, Donna C, NP  Vitamin D, Ergocalciferol, (DRISDOL) 50000 UNITS CAPS Take 50,000 Units by mouth 2 (two) times a week. Takes on Tuesdays and Fridays    [provider]    Family History Family History  Problem Relation Age of Onset  . Heart attack Father   . Hypertension Mother   . Stroke Mother   . Hypertension Maternal Grandfather   . Colon cancer Neg Hx     Social History Social History   Tobacco Use  . Smoking status: Passive Smoke Exposure - Never Smoker  . Smokeless tobacco: Never Used  . Tobacco comment: "husband passed in 2010; father passed 1978"  Substance Use Topics  . Alcohol use: Yes    Alcohol/week: 0.0 oz    Comment: glass of wine occaisionally   . Drug use: No     Allergies   Zanaflex [tizanidine]; Percocet [oxycodone-acetaminophen]; Tramadol; Sulfa antibiotics; and Sulfacetamide sodium   Review of Systems Review of Systems  Constitutional: Negative for chills and fever.  HENT: Negative for trouble swallowing.   Eyes: Negative for visual disturbance.  Respiratory:  Negative for cough and shortness of breath.   Cardiovascular: Negative for chest pain, palpitations and leg swelling.  Gastrointestinal: Positive for abdominal pain. Negative for diarrhea, nausea and vomiting.  Genitourinary: Negative for dysuria, flank pain, frequency and hematuria.  Musculoskeletal: Negative for back pain and neck pain.  Skin: Negative for rash and wound.  Neurological: Positive for dizziness and light-headedness. Negative for syncope, weakness, numbness and headaches.  All other systems reviewed and are negative.    Physical Exam Updated Vital Signs BP 97/66   Pulse 73   Temp (!) 97.5 F (36.4 C) (Oral)   Resp 16   Ht 5' 4.5" (1.638 m)   Wt 89.8 kg (198 lb)   SpO2 94%   BMI 33.46 kg/m   Physical Exam  Constitutional: She is oriented to person, place, and time. She appears well-developed and well-nourished. No distress.  HENT:  Head: Normocephalic and atraumatic.  Mouth/Throat: Oropharynx is clear and moist. No oropharyngeal exudate.  Eyes: Pupils are equal, round, and reactive to light. EOM are normal.  Neck: Normal range of motion. Neck supple.  Cardiovascular: Normal rate and regular rhythm. Exam reveals no gallop and no friction rub.  No murmur heard. Pulmonary/Chest: Effort normal and breath sounds normal. No stridor. No respiratory distress. She has no wheezes. She has no rales. She exhibits no tenderness.  Abdominal: Soft. Bowel sounds are normal. There is no tenderness. There is no rebound and no guarding.  Musculoskeletal: Normal range of motion. She exhibits no edema or tenderness.  No lower extremity swelling, asymmetry or tenderness.  Distal pulses are 2+.  No midline thoracic or lumbar tenderness.  No CVA tenderness.  Lymphadenopathy:    She has no cervical adenopathy.  Neurological: She is alert and oriented to person, place, and time.  5/5 motor in all extremities.  Sensation fully intact.  Skin: Skin is warm and dry. Capillary refill takes  less than 2 seconds. No rash noted. She is not diaphoretic. No erythema.  Psychiatric: She has a normal mood and affect. Her behavior is normal.  Nursing note and vitals reviewed.    ED Treatments / Results  Labs (all labs ordered are listed, but only abnormal results are displayed) Labs Reviewed  BASIC METABOLIC PANEL - Abnormal; Notable for the following components:        Result Value   Glucose, Bld 158 (*)    Creatinine, Ser 1.13 (*)    Calcium 8.4 (*)    GFR calc non Af Amer 45 (*)    GFR calc Af Amer 53 (*)    All other components within normal limits  PROTIME-INR - Abnormal; Notable for the following components:   Prothrombin Time 16.6 (*)    All other components within normal limits  CBG MONITORING, ED - Abnormal; Notable for the following components:   Glucose-Capillary 238 (*)    All other components within normal limits  CBC  URINALYSIS, ROUTINE W REFLEX MICROSCOPIC    EKG EKG Interpretation  Date/Time:  Monday September 24 2017 11:02:32 EDT Ventricular Rate:  80 PR Interval:    QRS Duration: 116 QT Interval:  420 QTC Calculation: 485 R Axis:   75 Text Interpretation:  Sinus or ectopic atrial rhythm Atrial premature complexes Incomplete right bundle branch block Low voltage, precordial leads Nonspecific T abnormalities, lateral leads Confirmed by Julianne Rice [phone]) on 09/24/2017 11:54:34 AM   Radiology No results found.  Procedures Procedures (including critical care time)  Medications Ordered in ED Medications  sodium chloride 0.9 % bolus 500 mL (has no administration in time range)  sodium chloride 0.9 % bolus 500 mL (0 mLs Intravenous Stopped 09/24/17 1331)  hydrocortisone sodium succinate (SOLU-CORTEF) 100 MG injection 100 mg (100 mg Intravenous Given 09/24/17 1312)     Initial Impression / Assessment and Plan / ED Course  I have reviewed the triage vital signs and the nursing notes.  Pertinent labs & imaging results that were available during my  care of the patient were reviewed by me and considered in my medical decision making (see chart for details).    Patient continues to be with a static despite IV fluids and stress dose of steroids.  Possibly related to medications.  This with Dr. Lorin Mercy who will see patient in the emergency department and admit.   Final Clinical Impressions(s) / ED Diagnoses   Final diagnoses:  Orthostatic hypotension    ED Discharge Orders    None       Julianne Rice, MD 09/24/17 [phone]

## 2017-09-25 DIAGNOSIS — E271 Primary adrenocortical insufficiency: Secondary | ICD-10-CM | POA: Diagnosis not present

## 2017-09-25 DIAGNOSIS — F329 Major depressive disorder, single episode, unspecified: Secondary | ICD-10-CM | POA: Diagnosis not present

## 2017-09-25 DIAGNOSIS — F419 Anxiety disorder, unspecified: Secondary | ICD-10-CM | POA: Diagnosis not present

## 2017-09-25 DIAGNOSIS — I951 Orthostatic hypotension: Secondary | ICD-10-CM | POA: Diagnosis not present

## 2017-09-25 DIAGNOSIS — Z7901 Long term (current) use of anticoagulants: Secondary | ICD-10-CM | POA: Diagnosis not present

## 2017-09-25 DIAGNOSIS — I10 Essential (primary) hypertension: Secondary | ICD-10-CM | POA: Diagnosis not present

## 2017-09-25 DIAGNOSIS — I2510 Atherosclerotic heart disease of native coronary artery without angina pectoris: Secondary | ICD-10-CM | POA: Diagnosis not present

## 2017-09-25 LAB — URINALYSIS, ROUTINE W REFLEX MICROSCOPIC
BACTERIA UA: NONE SEEN
BILIRUBIN URINE: NEGATIVE
Glucose, UA: 150 mg/dL — AB
Ketones, ur: NEGATIVE mg/dL
LEUKOCYTES UA: NEGATIVE
Nitrite: NEGATIVE
PROTEIN: NEGATIVE mg/dL
Specific Gravity, Urine: 1.009 (ref 1.005–1.030)
pH: 6 (ref 5.0–8.0)

## 2017-09-25 LAB — BASIC METABOLIC PANEL
ANION GAP: 8 (ref 5–15)
BUN: 18 mg/dL (ref 8–23)
CALCIUM: 8.3 mg/dL — AB (ref 8.9–10.3)
CO2: 26 mmol/L (ref 22–32)
Chloride: 105 mmol/L (ref 98–111)
Creatinine, Ser: 0.81 mg/dL (ref 0.44–1.00)
GFR calc Af Amer: >60 mL/min (ref >60)
GLUCOSE: 174 mg/dL — AB (ref 70–99)
Potassium: 4.5 mmol/L (ref 3.5–5.1)
Sodium: 139 mmol/L (ref 135–145)

## 2017-09-25 LAB — CBC
HEMATOCRIT: 40.8 % (ref 36.0–46.0)
HEMOGLOBIN: 12.8 g/dL (ref 12.0–15.0)
MCH: 29.3 pg (ref 26.0–34.0)
MCHC: 31.4 g/dL (ref 30.0–36.0)
MCV: 93.4 fL (ref 78.0–100.0)
Platelets: 226 10*3/uL (ref 150–400)
RBC: 4.37 MIL/uL (ref 3.87–5.11)
RDW: 13.2 % (ref 11.5–15.5)
WBC: 9.2 10*3/uL (ref 4.0–10.5)

## 2017-09-25 LAB — MAGNESIUM: Magnesium: 2.1 mg/dL (ref 1.7–2.4)

## 2017-09-25 LAB — TSH: TSH: 0.282 u[IU]/mL — ABNORMAL LOW (ref 0.350–4.500)

## 2017-09-25 MED ORDER — DILTIAZEM HCL ER COATED BEADS 180 MG PO CP24: 180.0000 mg | ORAL_CAPSULE | Freq: Every day | ORAL | Status: DC

## 2017-09-25 MED ORDER — POTASSIUM CHLORIDE ER 10 MEQ PO TBCR: 20.0000 meq | EXTENDED_RELEASE_TABLET | Freq: Every day | ORAL | Status: DC

## 2017-09-25 MED ORDER — FLUOXETINE HCL 10 MG PO CAPS
10.0000 mg | ORAL_CAPSULE | Freq: Every day | ORAL | Status: DC
  Administered 2017-09-25: 10 mg via ORAL
  Filled 2017-09-25: qty 1

## 2017-09-25 MED ORDER — HYDROCORTISONE 20 MG PO TABS: 20.0000 mg | ORAL_TABLET | Freq: Every day | ORAL | 0 refills | Status: DC

## 2017-09-25 MED ORDER — HYDROCORTISONE 20 MG PO TABS: 40.0000 mg | ORAL_TABLET | Freq: Every day | ORAL | Status: DC

## 2017-09-25 MED ORDER — HYDROCORTISONE 10 MG PO TABS
20.0000 mg | ORAL_TABLET | Freq: Every day | ORAL | Status: DC
  Filled 2017-09-25: qty 2

## 2017-09-25 MED ORDER — HYDROCORTISONE 10 MG PO TABS: 10.0000 mg | ORAL_TABLET | Freq: Every day | ORAL | Status: DC

## 2017-09-25 MED ORDER — MAGNESIUM OXIDE 400 (241.3 MG) MG PO TABS
400.0000 mg | ORAL_TABLET | Freq: Every day | ORAL | Status: DC
  Administered 2017-09-25: 400 mg via ORAL
  Filled 2017-09-25: qty 1

## 2017-09-25 MED ORDER — FUROSEMIDE 20 MG PO TABS: 20.0000 mg | ORAL_TABLET | ORAL | Status: DC | PRN

## 2017-09-25 MED ORDER — HYDROCORTISONE 20 MG PO TABS: 20.0000 mg | ORAL_TABLET | Freq: Every day | ORAL | Status: DC

## 2017-09-25 MED ORDER — HYDROCORTISONE 20 MG PO TABS: 40.0000 mg | ORAL_TABLET | Freq: Every day | ORAL | 0 refills | Status: DC

## 2017-09-25 NOTE — Discharge Summary (Signed)
Physician Discharge Summary  [name] WUX:[id number] DOB: May 07, 1940 DOA: 09/24/2017  PCP: Crist Infante, MD  Admit date: 09/24/2017 Discharge date: 09/25/2017  Admitted From: home Discharge disposition: home   Recommendations for Outpatient Follow-Up:   Close follow up with endo for titration down of steroids and follow up for TSH Encouraged patient to stay hydrated Clarification of Cardizem dose: BID or daily  Discharge Diagnosis:   Principal Problem:   Orthostatic hypotension Active Problems:   Essential hypertension   Addison disease ([place])   Atrial fibrillation ([place])   Chronic anticoagulation   Hypothyroidism    Discharge Condition: Improved.  Diet recommendation: Low sodium, heart healthy.  Carbohydrate-modified  Wound care: None.  Code status: Full.   History of Present Illness:   [name] is a 77 y.o. female with medical history significant of hypothyroidism; HTN; HLD: CAD; Addison's diease; and afib presenting with weakness.  When she got up this AM at 8, she was very dizzy and felt like she would pass out.  She didn't feel well yesterday either.  By about 930, a caretaker was concerned and encouraged her to push her LifeAlert.  She tried to sit up to go to the bathroom and felt like she would pass out any minute.   She laid down in the floor.  Her BP was very low, diastolic 30 this AM.  She has always had HTN and takes medication for this.  After her heart went into chronic afib and she was getting shocked every other month (4 shocks in April), she was started on Tikosyn and that has worked great at keeping it in rhythm.  Yesterday, she felt general malaise, no energy or stamina.  Mild SOB with walking up/down her driveway.  Saturday was uneventful but it was a somewhat busy day - she had company, and she was overseeing folks working in her yard.  She does not drink a lot of water, but she does drink half-caf tea and sodas.  Normal breakfast  yesterday AM; maybe missed lunch; made breakfast for dinner.     Hospital Course by Problem:   Orthostatic hypotension -Patient is presenting with hypotension, particularly orthostatic hypotension -Most likely etiology is dehydration -She was given IVF in the ER and improved -ambulating with no issues  Addison disease -She is on chronic daily steroids -She was given 100 mg IV hydrocortisone in the ER as stress-dosed steroids -recently on prednsone taper for back pain -will double dose and have follow up with endocrinology  Afib on Lif[place] -She is rate controlled on Tikosyn and Cardizem - will continue both -Continue Eliquis  HTN -Continue Cardizem and Tikosyn as above -BP improved so will resume -? If patient is to take cardizem BID (per cardiology notes) or daily  Hypothyroidism -TSH pending -Continue Synthroid at current dose for now      Medical Consultants:      Discharge Exam:   Vitals:   09/25/17 1123 09/25/17 1129  BP: (!) 181/79 (!) 155/74  Pulse: 76   Resp: 19   Temp: 97.7 F (36.5 C)   SpO2: 97%    Vitals:   09/25/17 0500 09/25/17 0800 09/25/17 1123 09/25/17 1129  BP: (!) 145/70 122/74 (!) 181/79 (!) 155/74  Pulse: 66 (!) 57 76   Resp: '18 16 19   ' Temp: 97.8 F (36.6 C) 97.8 F (36.6 C) 97.7 F (36.5 C)   TempSrc: Oral Oral Oral   SpO2: 98% 95% 97%   Weight:  89.1 kg (196 lb 6.4 oz)     Height:        General exam: Appears calm and comfortable.      The results of significant diagnostics from this hospitalization (including imaging, microbiology, ancillary and laboratory) are listed below for reference.     Procedures and Diagnostic Studies:   Dg Chest Port 1 View  Result Date: 09/24/2017 CLINICAL DATA:  Decreased blood pressure, lightheadedness today. History of hypertension, atrial fibrillation, Addison's disease. EXAM: PORTABLE CHEST 1 VIEW COMPARISON:  Chest x-rays dated 03/22/2016 and 02/02/2016. FINDINGS: Mild cardiomegaly is  stable. Overall cardiomediastinal silhouette is stable. Median sternotomy wires appear intact and stable in alignment. Neural stimulator hardware appears stable in position near the midline of the mid/upper thoracic spine. Lungs are clear. No pleural effusion or pneumothorax seen. No acute or suspicious osseous finding. IMPRESSION: No active disease.  No evidence of pneumonia or pulmonary edema. Electronically Signed   By: Franki Cabot M.D.   On: 09/24/2017 15:53     Labs:   Basic Metabolic Panel: Recent Labs  Lab 09/24/17 1114 09/25/17 0600  NA 138 139  K 3.9 4.5  CL 103 105  CO2 26 26  GLUCOSE 158* 174*  BUN 19 18  CREATININE 1.13* 0.81  CALCIUM 8.4* 8.3*  MG  --  2.1   GFR Estimated Creatinine Clearance: 62.5 mL/min (by C-G formula based on SCr of 0.81 mg/dL). Liver Function Tests: No results for input(s): AST, ALT, ALKPHOS, BILITOT, PROT, ALBUMIN in the last 168 hours. No results for input(s): LIPASE, AMYLASE in the last 168 hours. No results for input(s): AMMONIA in the last 168 hours. Coagulation profile Recent Labs  Lab 09/24/17 1116  INR 1.35    CBC: Recent Labs  Lab 09/24/17 1114 09/25/17 0600  WBC 10.4 9.2  HGB 13.7 12.8  HCT 43.0 40.8  MCV 92.9 93.4  PLT 233 226   Cardiac Enzymes: No results for input(s): CKTOTAL, CKMB, CKMBINDEX, TROPONINI in the last 168 hours. BNP: Invalid input(s): POCBNP CBG: Recent Labs  Lab 09/24/17 1112  GLUCAP 238*   D-Dimer No results for input(s): DDIMER in the last 72 hours. Hgb A1c No results for input(s): HGBA1C in the last 72 hours. Lipid Profile No results for input(s): CHOL, HDL, LDLCALC, TRIG, CHOLHDL, LDLDIRECT in the last 72 hours. Thyroid function studies Recent Labs    09/25/17 0709  TSH 0.282*   Anemia work up No results for input(s): VITAMINB12, FOLATE, FERRITIN, TIBC, IRON, RETICCTPCT in the last 72 hours. Microbiology No results found for this or any previous visit (from the past 240  hour(s)).   Discharge Instructions:   Discharge Instructions    Diet - low sodium heart healthy   Complete by:  As directed    Diet Carb Modified   Complete by:  As directed    Discharge instructions   Complete by:  As directed    Be sure to drink plenty of water Bring log of blood pressure readings to PCP Follow up with Dr. Lovena Le regarding cardizem dose (daily vs BID--- I was not able to find is his note to change)   Increase activity slowly   Complete by:  As directed      Allergies as of 09/25/2017      Reactions   Zanaflex [tizanidine] Other (See Comments)   HALLUCINATIONS   Percocet [oxycodone-acetaminophen] Other (See Comments)   Hallucination   Tramadol Other (See Comments)   Hallucinations.   Sulfa Antibiotics Itching  Vaginal itching   Sulfacetamide Sodium Itching   Vaginal itching      Medication List    TAKE these medications   acetaminophen 500 MG tablet Commonly known as:  TYLENOL Take 500 mg by mouth daily as needed for headache.   aspirin EC 81 MG tablet Take 81 mg by mouth every Monday, Wednesday, and Friday.   clonazePAM 0.5 MG tablet Commonly known as:  KLONOPIN Take half (1/2) to one (1) tablet (0.25 mg to 0.5 mg) by mouth twice daily as needed for anxiety.   Cyanocobalamin 1000 MCG/ML Kit Inject 1 mL into the muscle See admin instructions. Take one (1) injection as directed every other month.   diltiazem 180 MG 24 hr capsule Commonly known as:  CARDIZEM CD Take 1 capsule (180 mg total) by mouth at bedtime.   dofetilide 125 MCG capsule Commonly known as:  TIKOSYN Take 1 capsule (125 mcg total) by mouth 2 (two) times daily.   ELIQUIS 5 MG Tabs tablet Generic drug:  apixaban Take 5 mg by mouth 2 (two) times daily.   FLUoxetine 10 MG tablet Commonly known as:  PROZAC Take 10 mg by mouth every morning.   furosemide 20 MG tablet Commonly known as:  LASIX Take 20 mg by mouth as needed for fluid or edema.   hydrocortisone 20 MG  tablet Commonly known as:  CORTEF Take 1 tablet (20 mg total) by mouth daily. What changed:  You were already taking a medication with the same name, and this prescription was added. Make sure you understand how and when to take each.   hydrocortisone 20 MG tablet Commonly known as:  CORTEF Take 2 tablets (40 mg total) by mouth daily with breakfast. Start taking on:  09/26/2017 What changed:    how much to take  when to take this  additional instructions   hyoscyamine 0.125 MG SL tablet Commonly known as:  LEVSIN SL Place 0.125 mg under the tongue daily as needed for cramping (IBS symptoms).   levothyroxine 88 MCG tablet Commonly known as:  SYNTHROID, LEVOTHROID Take 1 tablet (88 mcg total) by mouth daily before breakfast.   Magnesium 250 MG Tabs Take 500 mg by mouth daily.   nitroGLYCERIN 0.4 MG SL tablet Commonly known as:  NITROSTAT Place 0.4 mg under the tongue every 5 (five) minutes as needed for chest pain (3 DOSES MAX FOR CHEST PAIN).   omeprazole 20 MG capsule Commonly known as:  PRILOSEC Take 20 mg by mouth daily.   potassium chloride 10 MEQ tablet Commonly known as:  K-DUR Take 2 tablets (20 mEq total) by mouth at bedtime.   PREBIOTIC PRODUCT PO Take 1 capsule by mouth at bedtime.   PROBIOTIC FORMULA PO Take 1 capsule by mouth at bedtime. For upset stomach   valsartan 80 MG tablet Commonly known as:  DIOVAN Take 1 tablet (80 mg total) by mouth daily with supper.   Vitamin D (Ergocalciferol) 50000 units Caps capsule Commonly known as:  DRISDOL Take 50,000 Units by mouth 2 (two) times a week. Takes on Tuesdays and Fridays      Follow-up Information    Crist Infante, MD Follow up in 1 week(s).   Specialty:  Internal Medicine Contact information: [place] [address] [phone]        Belva Crome, MD .   Specialty:  Cardiology Contact information: [phone] [address] [place] [address] [phone]         Cristopher Peru  W, MD Follow up.   Specialty:  Cardiology Why:  regarding cardizem dose-- daily vs BID Contact information: [address] [address] [phone]        Jacelyn Pi, MD Follow up.   Specialty:  Endocrinology Why:  for taper of your steroids back to baseline Contact information: [address] [place] [address] [phone]            Time coordinating discharge: 35 min  Signed:  Geradine Girt  [facility] 09/25/2017, 11:37 AM

## 2017-09-25 NOTE — Progress Notes (Signed)
Discharge instruction was given to pt.  Belongings were sent home with pt.  Keymani Mclean, RN 

## 2017-09-25 NOTE — Progress Notes (Signed)
   09/25/17 0500  Orthostatic Lying   BP- Lying 145/70  Pulse- Lying 66  Orthostatic Sitting  BP- Sitting 150/84  Pulse- Sitting 66  Orthostatic Standing at 0 minutes  BP- Standing at 0 minutes 151/75  Pulse- Standing at 0 minutes 75  Orthostatic Standing at 3 minutes  BP- Standing at 3 minutes 157/80  Pulse- Standing at 3 minutes 74   Morning orthostatics

## 2017-10-03 ENCOUNTER — Telehealth: Payer: Self-pay | Admitting: Internal Medicine

## 2017-10-03 NOTE — Telephone Encounter (Signed)
New Message   Pt c/o Shortness Of Breath: STAT if SOB developed within the last 24 hours or pt is noticeably SOB on the phone  1. Are you currently SOB (can you hear that pt is SOB on the phone)? yes  2. How long have you been experiencing SOB? Since 09/26/17  3. Are you SOB when sitting or when up moving around? Moving around  4. Are you currently experiencing any other symptoms? Heart rate 150, 120, 101 and she was in the hospital for adrenaline failure

## 2017-10-03 NOTE — Telephone Encounter (Signed)
Patient called with concerns over HR and SOB.  She would like some advice on getting her HR down   I spoke to Dr Lovena Le and he recommended that she takes an extra dose of Diltiazem 180 mg now and that should reduce her episodes of A fib and hopefully reduce HR.   I returned call to the patient and gave her recommendations.  She verbalized understanding and will call us to give an update.

## 2017-10-03 NOTE — Telephone Encounter (Signed)
Agree with above 

## 2017-10-05 ENCOUNTER — Other Ambulatory Visit: Payer: Self-pay | Admitting: Interventional Cardiology

## 2017-10-05 DIAGNOSIS — I4819 Other persistent atrial fibrillation: Secondary | ICD-10-CM

## 2017-10-05 NOTE — Telephone Encounter (Signed)
Eliquis 5mg  refill request received; pt is 77 yrs old, wt-90.4kg, Crea-0.81 on 09/25/17, last seen by  Dr. Lovena Le on 08/29/17; will send in a refill to request pharmacy.

## 2017-10-08 ENCOUNTER — Telehealth: Payer: Self-pay | Admitting: Internal Medicine

## 2017-10-08 ENCOUNTER — Encounter: Payer: Self-pay | Admitting: Interventional Cardiology

## 2017-10-08 ENCOUNTER — Ambulatory Visit: Payer: Medicare Other | Admitting: Interventional Cardiology

## 2017-10-08 VITALS — BP 108/70 | HR 85 | Ht 64.5 in | Wt 194.8 lb

## 2017-10-08 DIAGNOSIS — I10 Essential (primary) hypertension: Secondary | ICD-10-CM | POA: Diagnosis not present

## 2017-10-08 DIAGNOSIS — Z5181 Encounter for therapeutic drug level monitoring: Secondary | ICD-10-CM | POA: Diagnosis not present

## 2017-10-08 DIAGNOSIS — Z79899 Other long term (current) drug therapy: Secondary | ICD-10-CM

## 2017-10-08 DIAGNOSIS — I481 Persistent atrial fibrillation: Secondary | ICD-10-CM

## 2017-10-08 DIAGNOSIS — E785 Hyperlipidemia, unspecified: Secondary | ICD-10-CM

## 2017-10-08 DIAGNOSIS — I4819 Other persistent atrial fibrillation: Secondary | ICD-10-CM

## 2017-10-08 DIAGNOSIS — I25709 Atherosclerosis of coronary artery bypass graft(s), unspecified, with unspecified angina pectoris: Secondary | ICD-10-CM | POA: Diagnosis not present

## 2017-10-08 NOTE — Progress Notes (Signed)
Cardiology Office Note    Date:  10/08/2017   ID:  [name], [name] 1941/01/14, MRN [id number]  PCP:  Crist Infante, MD  Cardiologist: Sinclair Grooms, MD   Chief Complaint  Patient presents with  . Atrial Fibrillation  . Coronary Artery Disease    History of Present Illness:  [name] is a 77 y.o. female  follow-up of coronary artery disease with prior bypass grafting, atrial fibrillation resulting in acute diastolic heart failure, hypertension, chronic anticoagulation therapy with Eliquisand hyperlipidemia.Was on amiodarone which was subsequently discontinued.  Complains of exertional dyspnea and increased heart rate week or so.  She has a history of recurrent atrial fibrillation.  We asked her to come in because of our concern that she may have recurrent A. fib despite being on Tikosyn.  Since getting out of the hospital with an episode of low blood pressure due to Addison's disease, she feels that her heart rate is faster than normal and she has dyspnea on exertion.  She has not missed any dose of anticoagulation.  Past Medical History:  Diagnosis Date  . Addison's disease ([place])   . Anxiety   . Arthritis    "everywhere"  . Atrial fibrillation ([facility])    Eliquis  . Chronic bronchitis ([place])    "although I didn't get it this year" (07/23/2017)  . 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  last knee surgery dr. Elmyra Ricks gave large dose of hydrocortisal and avoided the hypotensive side effectas of addison's disease.  . Coronary artery disease   . Depression   . GERD (gastroesophageal reflux disease)   . Hyperlipemia   . Hypertension   . Hypothyroidism   . Lumbar radiculopathy 04/27/2014  . Migraine    "in my 20's" (07/23/2017)  . Recurrent upper respiratory infection (URI)     Past Surgical History:  Procedure Laterality Date  . ACHILLES TENDON SURGERY Left 07/16/2014   bone spur removed  . ANTERIOR LUMBAR  FUSION  1980   L4-5  . BACK SURGERY    . CARDIAC CATHETERIZATION  2013  . CARDIAC CATHETERIZATION N/A 09/02/2014   Procedure: Left Heart Cath and Coronary Angiography;  Surgeon: Wellington Hampshire, MD;  Location: [facility];  Service: Cardiovascular;  Laterality: N/A;  . CARDIOVERSION N/A 10/22/2015   Procedure: CARDIOVERSION;  Surgeon: Sanda Klein, MD;  Location: [facility];  Service: Cardiovascular;  Laterality: N/A;  . CARDIOVERSION N/A 06/07/2017   Procedure: CARDIOVERSION;  Surgeon: Larey Dresser, MD;  Location: [facility];  Service: Cardiovascular;  Laterality: N/A;  . CARDIOVERSION N/A 07/05/2017   Procedure: CARDIOVERSION;  Surgeon: Pixie Casino, MD;  Location: [facility];  Service: Cardiovascular;  Laterality: N/A;  . CARPAL TUNNEL RELEASE Right   . CATARACT EXTRACTION, BILATERAL Bilateral   . CORONARY ARTERY BYPASS GRAFT  06/26/2011   Procedure: CORONARY ARTERY BYPASS GRAFTING (CABG);  Surgeon: Melrose Nakayama, MD;  Location: [facility];  Service: Open Heart Surgery;  Laterality: N/A;  times using Greater Saphenous Vein Graft harvested endoscopically from right leg  . DIAGNOSTIC LAPAROSCOPY    . DILATION AND CURETTAGE OF UTERUS    . JOINT REPLACEMENT    . KNEE ARTHROSCOPY Bilateral   . LAPAROSCOPIC CHOLECYSTECTOMY    . REVISION TOTAL KNEE ARTHROPLASTY Right   . SPINAL CORD STIMULATOR IMPLANT  12/10/15  . TONSILLECTOMY    . TOTAL KNEE ARTHROPLASTY Bilateral     Current Medications: Outpatient  Medications Prior to Visit  Medication Sig Dispense Refill  . acetaminophen (TYLENOL) 500 MG tablet Take 500 mg by mouth daily as needed for headache.    [name] apixaban (ELIQUIS) 5 MG TABS tablet Take 5 mg by mouth 2 (two) times daily.    [name] aspirin EC 81 MG tablet Take 81 mg by mouth every Monday, Wednesday, and Friday.    . clonazePAM (KLONOPIN) 0.5 MG tablet Take half (1/2) to one (1) tablet (0.25 mg to 0.5 mg) by mouth twice daily as needed for anxiety.    . Cyanocobalamin  1000 MCG/ML KIT Inject 1 mL into the muscle See admin instructions. Take one (1) injection as directed every other month.    . diltiazem (CARDIZEM CD) 180 MG 24 hr capsule Take 1 capsule (180 mg total) by mouth at bedtime.    . dofetilide (TIKOSYN) 125 MCG capsule Take 1 capsule (125 mcg total) by mouth 2 (two) times daily. 180 capsule 1  . FLUoxetine (PROZAC) 10 MG tablet Take 10 mg by mouth every morning.     . furosemide (LASIX) 20 MG tablet Take half tablet (10 mg) by mouth every third (3rd) day.    . hydrocortisone (CORTEF) 20 MG tablet Take 20 mg by mouth 2 (two) times daily.    . hyoscyamine (LEVSIN SL) 0.125 MG SL tablet Place 0.125 mg under the tongue daily as needed for cramping (IBS symptoms).    [name] levothyroxine (SYNTHROID, LEVOTHROID) 88 MCG tablet Take 1 tablet (88 mcg total) by mouth daily before breakfast. 30 tablet 0  . Magnesium 250 MG TABS Take 500 mg by mouth daily.     . nitroGLYCERIN (NITROSTAT) 0.4 MG SL tablet Place 0.4 mg under the tongue every 5 (five) minutes as needed for chest pain (3 DOSES MAX FOR CHEST PAIN).    [name] omeprazole (PRILOSEC) 20 MG capsule Take 20 mg by mouth daily.     . potassium chloride (K-DUR) 10 MEQ tablet Take 2 tablets (20 mEq total) by mouth at bedtime.    [name] PREBIOTIC PRODUCT PO Take 1 capsule by mouth at bedtime.     . Probiotic Product (PROBIOTIC FORMULA PO) Take 1 capsule by mouth at bedtime. For upset stomach     . valsartan (DIOVAN) 80 MG tablet Take half tablet (40 mg) by mouth each morning. Take additional half (40 mg) by mouth in the afternoon as needed for BP greater than 140/80.    . Vitamin D, Ergocalciferol, (DRISDOL) 50000 UNITS CAPS Take 50,000 Units by mouth 2 (two) times a week. Takes on Tuesdays and Fridays    . apixaban (ELIQUIS) 5 MG TABS tablet Take 5 mg by mouth 2 (two) times daily.    [name] ELIQUIS 5 MG TABS tablet TAKE ONE TABLET TWICE DAILY (Patient not taking: Reported on 10/08/2017) 60 tablet 11  . hydrocortisone (CORTEF) 20 MG  tablet Take 2 tablets (40 mg total) by mouth daily with breakfast. (Patient not taking: Reported on 10/08/2017) 40 tablet 0  . hydrocortisone (CORTEF) 20 MG tablet Take 1 tablet (20 mg total) by mouth daily. (Patient not taking: Reported on 10/08/2017) 20 tablet 0  . valsartan (DIOVAN) 80 MG tablet Take 1 tablet (80 mg total) by mouth daily with supper. (Patient not taking: Reported on 10/08/2017) 30 tablet 3   No facility-administered medications prior to visit.      Allergies:   Zanaflex [tizanidine]; Percocet [oxycodone-acetaminophen]; Tramadol; Sulfa antibiotics; and Sulfacetamide sodium   Social History   Socioeconomic History  .  Marital status: Widowed    Spouse name: Not on file  . Number of children: Not on file  . Years of education: Not on file  . Highest education level: Not on file  Occupational History  . Occupation: Retired  Scientific laboratory technician  . Financial resource strain: Not on file  . Food insecurity:    Worry: Not on file    Inability: Not on file  . Transportation needs:    Medical: Not on file    Non-medical: Not on file  Tobacco Use  . Smoking status: Passive Smoke Exposure - Never Smoker  . Smokeless tobacco: Never Used  . Tobacco comment: "husband passed in 2010; father passed 1978"  Substance and Sexual Activity  . Alcohol use: Yes    Alcohol/week: 0.0 oz    Comment: glass of wine occasionally   . Drug use: No  . Sexual activity: Not Currently    Birth control/protection: Post-menopausal  Lifestyle  . Physical activity:    Days per week: Not on file    Minutes per session: Not on file  . Stress: Not on file  Relationships  . Social connections:    Talks on phone: Not on file    Gets together: Not on file    Attends religious service: Not on file    Active member of club or organization: Not on file    Attends meetings of clubs or organizations: Not on file    Relationship status: Not on file  Other Topics Concern  . Not on file  Social History  Narrative  . Not on file     Family History:  The patient's family history includes Heart attack in her father; Hypertension in her maternal grandfather and mother; Stroke in her mother.   ROS:   Please see the history of present illness.    As noted above shortness of breath with activity, dizziness, recent hospitalization for low blood pressure due to Addison's disease.  Hydrocortisone dose was adjusted.  Since getting out of the hospital she feels that her heart rate is been higher than normal. All other systems reviewed and are negative.   PHYSICAL EXAM:   VS:  BP 108/70   Pulse 85   Ht 5' 4.5" (1.638 m)   Wt 194 lb 12.8 oz (88.4 kg)   BMI 32.92 kg/m    GEN: Well nourished, well developed, in no acute distress  HEENT: normal  Neck: no JVD, carotid bruits, or masses Cardiac: RRR; no murmurs, rubs, or gallops,no edema  Respiratory:  clear to auscultation bilaterally, normal work of breathing GI: soft, nontender, nondistended, + BS MS: no deformity or atrophy  Skin: warm and dry, no rash Neuro:  Alert and Oriented x 3, Strength and sensation are intact Psych: euthymic mood, full affect  Wt Readings from Last 3 Encounters:  10/08/17 194 lb 12.8 oz (88.4 kg)  09/25/17 196 lb 6.4 oz (89.1 kg)  08/29/17 199 lb 6.4 oz (90.4 kg)      Studies/Labs Reviewed:   EKG:  EKG sinus rhythm, 85 bpm, with occasional PACs, and otherwise no change compared to 09/25/2017.  Recent Labs: 09/25/2017: BUN 18; Creatinine, Ser 0.81; Hemoglobin 12.8; Magnesium 2.1; Platelets 226; Potassium 4.5; Sodium 139; TSH 0.282   Lipid Panel    Component Value Date/Time   CHOL 100 09/02/2014 0346   TRIG 78 09/02/2014 0346   HDL 40 (L) 09/02/2014 0346   CHOLHDL 2.5 09/02/2014 0346   VLDL 16 09/02/2014 0346   LDLCALC  44 09/02/2014 0346    Additional studies/ records that were reviewed today include:  No new data    ASSESSMENT:    1. Persistent atrial fibrillation ([place])   2. Snoring   3.  Hyperlipidemia with target LDL less than 70   4. Coronary artery disease involving coronary bypass graft of native heart with angina pectoris ([place])   5. Essential hypertension   6. Visit for monitoring Tikosyn therapy      PLAN:  In order of problems listed above:  1. On Tikosyn, the patient is in sinus rhythm with PACs today.  Therefore her current complaints are not related to recurrent atrial fibrillation.  Ranges are made in her current regimen.  Perhaps her current symptoms are related to adrenal gland under activity.  She sees Dr. Michiel Sites this week for further adjustment. 2. Not discussed 3. Not discussed 4. This does not sound like coronary disease 5. She has lost weight.  We may need to continue cutting back on antihypertensive therapy.  This may include getting rid of her ARB completely. 6. Continue Tikosyn as previously prescribed.    Medication Adjustments/Labs and Tests Ordered: Current medicines are reviewed at length with the patient today.  Concerns regarding medicines are outlined above.  Medication changes, Labs and Tests ordered today are listed in the Patient Instructions below. There are no Patient Instructions on file for this visit.   Signed, Sinclair Grooms, MD  10/08/2017 3:05 PM    [facility] [address], [place], [place]  [address] Phone: [phone]; Fax: [phone]

## 2017-10-08 NOTE — Telephone Encounter (Signed)
New message:       STAT if HR is under 50 or over 120 (normal HR is 60-100 beats per minute)  1) What is your heart rate? 88-98  2) Do you have a log of your heart rate readings (document readings)?   3) Do you have any other symptoms? SOB on exertion    Pt talk to triage RN on last week about the same issue and was told to call back if it did not get any better

## 2017-10-08 NOTE — Patient Instructions (Signed)
Medication Instructions:  Your physician recommends that you continue on your current medications as directed. Please refer to the Current Medication list given to you today.  Labwork: None  Testing/Procedures: None  Follow-Up: Your physician recommends that you schedule a follow-up appointment in: 3-4 months with [name].    Any Other Special Instructions Will Be Listed Below (If Applicable).     If you need a refill on your cardiac medications before your next appointment, please call your pharmacy.

## 2017-10-08 NOTE — Telephone Encounter (Signed)
Spoke with pt and she states her HR is still higher than normal for her.  Last week was in the 100's consistently.  Took an extra Dilt as instructed by Dr. Lovena Le and HR has still been 80's-90's, occasionally still in 100's.  Denies dizziness.  Does have DOE that resolves quickly with rest.  Afraid pt may be back in Afib.  Spoke with [name] and he feels she may be back in Afib as well.  Scheduled pt to come in today for EKG and eval of sx.  Pt verbalized understanding and was in agreement with this plan.

## 2017-10-31 ENCOUNTER — Telehealth: Payer: Self-pay | Admitting: Internal Medicine

## 2017-10-31 NOTE — Telephone Encounter (Signed)
Returned call to Pt.  Per Pt she has had morning dizziness for 4 hours since starting dofetilide in April.   She does not notice dizziness after her PM dose but Pt states she goes to bed after her PM dose.  Advised Pt Dr. Lovena Le states she can stop dofetilide if the dizziness bothers her but she will go back into afib.  Pt states she will tolerate the dizziness at this time-will call back if she decides to stop.

## 2017-10-31 NOTE — Telephone Encounter (Signed)
New Message        Pt c/o medication issue:  1. Name of Medication: Tikosyn  2. How are you currently taking this medication (dosage and times per day)? 2 x a day  3. Are you having a reaction (difficulty breathing--STAT)? No  4. What is your medication issue? Makes patient dizzy for about 4 hours x 4 weeks

## 2017-11-01 ENCOUNTER — Other Ambulatory Visit (HOSPITAL_COMMUNITY): Payer: Self-pay | Admitting: *Deleted

## 2017-11-01 ENCOUNTER — Other Ambulatory Visit: Payer: Self-pay | Admitting: Interventional Cardiology

## 2017-12-04 ENCOUNTER — Other Ambulatory Visit: Payer: Self-pay | Admitting: Interventional Cardiology

## 2017-12-04 ENCOUNTER — Other Ambulatory Visit (HOSPITAL_COMMUNITY): Payer: Self-pay | Admitting: *Deleted

## 2017-12-04 MED ORDER — VALSARTAN 80 MG PO TABS: ORAL_TABLET | ORAL | 3 refills | Status: DC

## 2017-12-11 ENCOUNTER — Encounter: Payer: Self-pay | Admitting: Surgery

## 2017-12-11 ENCOUNTER — Ambulatory Visit: Payer: Self-pay | Admitting: Surgery

## 2017-12-11 ENCOUNTER — Telehealth: Payer: Self-pay | Admitting: Interventional Cardiology

## 2017-12-11 DIAGNOSIS — K632 Fistula of intestine: Secondary | ICD-10-CM | POA: Insufficient documentation

## 2017-12-11 NOTE — Telephone Encounter (Signed)
Spoke with pt and she states for about 2 weeks now she has been having severe leg cramps at night that keep her up and down all night.  Spoke with PCP and they drew labs on her yesterday, results not in yet.  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  Pt did continue K+ though.  Advised pt she should take her Furosemide if she's going to take K+ so K+ doesn't get too high.  PCP gave pt medication for muscle spasms and told her to wear compression stockings.  Neither are helping.  Pt states she has slight swelling in both legs but not a lot.  Advised pt to go ahead and take her Furosemide and see what PCP says about labs.  Will route to [name] and see if he has further recommendations.

## 2017-12-11 NOTE — Telephone Encounter (Signed)
Ask her to try low dose Magnesium suppliment over the counter.

## 2017-12-11 NOTE — H&P (Signed)
[name] Documented: 12/11/2017 9:02 AM Location: [facility] Patient #: [id number] DOB: [birth year] Single / Language: [name] / Race: White Female  History of Present Illness Adin Hector MD; 12/11/2017 9:57 AM) The patient is a 77 year old female who presents with colovesical fistula. Note for "Colovesical fistula": ` ` ` Patient sent for surgical consultation at the request of Dr Pilar Jarvis  Chief Complaint: Pneumaturia with recurrent urinary tract infections. Probable colovesical fistula. ` ` The patient is a pleasant obese female with known sigmoid diverticulosis based on prior CT scans and endoscopy. Had urinary tract infections and pneumaturia. So urology. 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 CT scan confirms large gas pocket in the bladder adherent to the sigmoid colon very suspicious for colovesical fistula. Surgical consultation requested. Patient has significant coronary disease that required 3 vessel bypass in 2015. Some dysrhythmias including atrial fibrillation. She is chronically anticoagulated on Eliquis. She is followed by Dr. Daneen Schick with the [facility]. She comes today with her son.   Patient notes evidence of urinary tract infections and passing gas and urine. Some flecks of stools well. Called her cardiologist. Set up with urology. 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 Followed by Dr. Chalmers Guest a and. Has severe low back pain with a nerve stimulator. She does do water aerobics and exercises at least 45 minutes twice a week. Harder to walk due to back pain. Usually moves her bowels most days. She does have a history of colon polyps followed by Dr. Meredith Mody. Last colonoscopy in 2016 with colon polyps. Adenomatous. Sessile serrated as well. She is due for 3 year follow-up colonoscopy. She is on Tiki sent for recurrent persistent atrial fibrillation. No  evidence of recurrence on a visit by her cardiologist 2 months ago. She is anticoagulated on Eliquis. She did have a few abdominal surgeries. One for an anterior spinal approach. He said numerous joint surgeries as well.  No personal nor family history of GI/colon cancer, inflammatory bowel disease, irritable bowel syndrome, allergy such as Celiac Sprue, dietary/dairy problems, colitis, ulcers nor gastritis. No recent sick contacts/gastroenteritis. No travel outside the country. No changes in diet. No dysphagia to solids or liquids. No significant heartburn or reflux. No hematochezia, hematemesis, coffee ground emesis. No evidence of prior gastric/peptic ulceration.  (Review of systems as stated in this history (HPI) or in the review of systems. Otherwise all other 12 point ROS are negative) ` ` `   Past Surgical History Geni Bers Haggett; 12/11/2017 9:02 AM) Cataract Surgery Bilateral. Colon Polyp Removal - Colonoscopy Colon Polyp Removal - Open Coronary Artery Bypass Graft Foot Surgery Left. Gallbladder Surgery - Open Knee Surgery Bilateral. Oral Surgery Spinal Surgery - Lower Back Tonsillectomy  Diagnostic Studies History Geni Bers Haggett; 12/11/2017 9:02 AM) Colonoscopy 1-5 years ago Mammogram within last year Pap Smear >5 years ago  Allergies Geni Bers Haggett; 12/11/2017 9:05 AM) Percocet *ANALGESICS - OPIOID* Hallucinations Sulfur (Antiseborrheic) *DERMATOLOGICALS* Vaginal Itching Allergies Reconciled  Medication History (Jacqueline Haggett; 12/11/2017 9:09 AM) Methocarbamol (500MG  Tablet, Oral) Active. Vitamin D2 (Oral) Specific strength unknown - Active. Valsartan (Oral) Specific strength unknown - Active. Probiotic (Oral) Specific strength unknown - Active. Potassium (Oral) Specific strength unknown - Active. Omeprazole (20MG  Capsule DR, Oral) Active. Nitroglycerin (0.4MG  Tab Sublingual, Sublingual) Active. Magnesium (Oral)  Specific strength unknown - Active. Levothyroxine Sodium (88MCG Tablet, Oral) Active. Hydrocortisone (0.25% Cream, External) Active. Furosemide (Oral) Specific strength unknown - Active. FLUoxetine HCl (Oral) Specific strength unknown -  Active. Eliquis (Oral) Specific strength unknown - Active. Acetaminophen (Oral) Specific strength unknown - Active. Aspirin (81MG  Tablet DR, Oral) Active. clonazePAM (Oral) Specific strength unknown - Active. B Complex B-12 (Oral) Active. dilTIAZem HCl (Oral) Specific strength unknown - Active. Dofetilide (Oral) Specific strength unknown - Active. Medications Reconciled  Social History Geni Bers Haggett; 12/11/2017 9:02 AM) Alcohol use Occasional alcohol use. Caffeine use Tea. No drug use Tobacco use Never smoker.  Family History Geni Bers Haggett; 12/11/2017 9:02 AM) Arthritis Father. Cerebrovascular Accident Mother. Heart Disease Father. Hypertension Mother. Migraine Headache Mother.  Pregnancy / Birth History Geni Bers Haggett; 12/11/2017 9:02 AM) Age at menarche 8 years. Age of menopause 39-55 Gravida 1 Maternal age 22-30 Para 1  Other Problems Geni Bers Haggett; 12/11/2017 9:02 AM) Anxiety Disorder Atrial Fibrillation Back Pain High blood pressure Hypercholesterolemia Other disease, cancer, significant illness Thyroid Disease     Review of Systems Geni Bers Haggett; 12/11/2017 9:02 AM) General Present- Fatigue. Not Present- Appetite Loss, Chills, Fever, Night Sweats, Weight Gain and Weight Loss. Skin Not Present- Change in Wart/Mole, Dryness, Hives, Jaundice, New Lesions, Non-Healing Wounds, Rash and Ulcer. HEENT Present- Wears glasses/contact lenses. Not Present- Earache, Hearing Loss, Hoarseness, Nose Bleed, Oral Ulcers, Ringing in the Ears, Seasonal Allergies, Sinus Pain, Sore Throat, Visual Disturbances and Yellow Eyes. Respiratory Present- Snoring. Not Present- Bloody sputum, Chronic  Cough, Difficulty Breathing and Wheezing. Breast Not Present- Breast Mass, Breast Pain, Nipple Discharge and Skin Changes. Cardiovascular Present- Leg Cramps and Shortness of Breath. Not Present- Chest Pain, Difficulty Breathing Lying Down, Palpitations, Rapid Heart Rate and Swelling of Extremities. Gastrointestinal Not Present- Abdominal Pain, Bloating, Bloody Stool, Change in Bowel Habits, Chronic diarrhea, Constipation, Difficulty Swallowing, Excessive gas, Gets full quickly at meals, Hemorrhoids, Indigestion, Nausea, Rectal Pain and Vomiting. Female Genitourinary Present- Frequency, Nocturia, Painful Urination and Urgency. Not Present- Pelvic Pain. Musculoskeletal Present- Back Pain. Not Present- Joint Pain, Joint Stiffness, Muscle Pain, Muscle Weakness and Swelling of Extremities. Neurological Present- Numbness and Tingling. Not Present- Decreased Memory, Fainting, Headaches, Seizures, Tremor, Trouble walking and Weakness. Psychiatric Not Present- Anxiety, Bipolar, Change in Sleep Pattern, Depression, Fearful and Frequent crying. Endocrine Present- Excessive Hunger. Not Present- Cold Intolerance, Hair Changes, Heat Intolerance, Hot flashes and New Diabetes. Hematology Present- Blood Thinners and Easy Bruising. Not Present- Excessive bleeding, Gland problems, HIV and Persistent Infections.  Vitals Geni Bers Haggett; 12/11/2017 9:10 AM) 12/11/2017 9:09 AM Weight: 197.6 lb Height: 64.5in Body Surface Area: 1.96 m Body Mass Index: 33.39 kg/m  Pain Level: 6/10 Temp.: 98.79F(Temporal)  Pulse: 94 (Regular)  P.OX: 92% (Room air) BP: 130/78 (Sitting, Left Arm, Standard)      Physical Exam Adin Hector MD; 12/11/2017 9:55 AM)  General Mental Status-Alert. General Appearance-Not in acute distress, Not Sickly. Orientation-Oriented X3. Hydration-Well hydrated. Voice-Normal.  Integumentary Global Assessment Upon inspection and palpation of skin surfaces of the -  Axillae: non-tender, no inflammation or ulceration, no drainage. and Distribution of scalp and body hair is normal. General Characteristics Temperature - normal warmth is noted.  Head and Neck Head-normocephalic, atraumatic with no lesions or palpable masses. Face Global Assessment - atraumatic, no absence of expression. Neck Global Assessment - no abnormal movements, no bruit auscultated on the right, no bruit auscultated on the left, no decreased range of motion, non-tender. Trachea-midline. Thyroid Gland Characteristics - non-tender.  Eye Eyeball - Left-Extraocular movements intact, No Nystagmus. Eyeball - Right-Extraocular movements intact, No Nystagmus. Cornea - Left-No Hazy. Cornea - Right-No Hazy. Sclera/Conjunctiva - Left-No scleral icterus, No Discharge. Sclera/Conjunctiva - Right-No scleral icterus,  No Discharge. Pupil - Left-Direct reaction to light normal. Pupil - Right-Direct reaction to light normal.  ENMT Ears Pinna - Left - no drainage observed, no generalized tenderness observed. Right - no drainage observed, no generalized tenderness observed. Nose and Sinuses External Inspection of the Nose - no destructive lesion observed. Inspection of the nares - Left - quiet respiration. Right - quiet respiration. Mouth and Throat Lips - Upper Lip - no fissures observed, no pallor noted. Lower Lip - no fissures observed, no pallor noted. Nasopharynx - no discharge present. Oral Cavity/Oropharynx - Tongue - no dryness observed. Oral Mucosa - no cyanosis observed. Hypopharynx - no evidence of airway distress observed.  Chest and Lung Exam Inspection Movements - Normal and Symmetrical. Accessory muscles - No use of accessory muscles in breathing. Palpation Palpation of the chest reveals - Non-tender. Auscultation Breath sounds - Normal and Clear.  Cardiovascular Auscultation Rhythm - Regular. Murmurs & Other Heart Sounds - Auscultation of the heart  reveals - No Murmurs and No Systolic Clicks.  Abdomen Inspection Inspection of the abdomen reveals - No Visible peristalsis and No Abnormal pulsations. Umbilicus - No Bleeding, No Urine drainage. Palpation/Percussion Palpation and Percussion of the abdomen reveal - Soft, Non Tender, No Rebound tenderness, No Rigidity (guarding) and No Cutaneous hyperesthesia. Note: Abdomen obese but soft. Not severely distended. No distasis recti. No umbilical or other anterior abdominal wall hernias  Female Genitourinary Sexual Maturity Tanner 5 - Adult hair pattern. Note: No vaginal bleeding nor discharge  Peripheral Vascular Upper Extremity Inspection - Left - No Cyanotic nailbeds, Not Ischemic. Right - No Cyanotic nailbeds, Not Ischemic.  Neurologic Neurologic evaluation reveals -normal attention span and ability to concentrate, able to name objects and repeat phrases. Appropriate fund of knowledge , normal sensation and normal coordination. Mental Status Affect - not angry, not paranoid. Cranial Nerves-Normal Bilaterally. Gait-Normal.  Neuropsychiatric Mental status exam performed with findings of-able to articulate well with normal speech/language, rate, volume and coherence, thought content normal with ability to perform basic computations and apply abstract reasoning and no evidence of hallucinations, delusions, obsessions or homicidal/suicidal ideation.  Musculoskeletal Global Assessment Spine, Ribs and Pelvis - no instability, subluxation or laxity. Right Upper Extremity - no instability, subluxation or laxity.  Lymphatic Head & Neck  General Head & Neck Lymphatics: Bilateral - Description - No Localized lymphadenopathy. Axillary  General Axillary Region: Bilateral - Description - No Localized lymphadenopathy. Femoral & Inguinal  Generalized Femoral & Inguinal Lymphatics: Left - Description - No Localized lymphadenopathy. Right - Description - No Localized  lymphadenopathy.    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  Standard care is sigmoid colectomy and bladder repair. We'll try and do this minimally invasively. Robotically.  Would like urology to do cystoscopy and firefly injections at the time of surgery to better help me identify the ureters.  Patient cryers cardiac clearance given her chronic anticoagulation on Eliquis as well as Tiki sent for her intermittent recurrent atrial fibrillation and coronary disease. She seen by [name] rather regularly, so hopefully just formality.  She needs colonoscopy done prior to surgery given her history of colon polyps and it being 3 years. She is due for one anyway. Ideally could try and coordinate it so that she gets the colonoscopy the day before surgery. That way she just has to deal with one bowel prep.  Patient has a nerve back stimulator. Don't know how this should be managed. Defer to her neurologist or neurosurgeon I put it in. My instinct is is it probably should be turned off just prior to surgery intern backed on like a pacemaker. Patient feels like she can manage that herself.  Long discussion with the patient and her son who is a Pharmacist, community. She wishes to proceed with surgery.   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  You should hear from our office's scheduling department within 5 working days about the location, date, and time of surgery. We try to make accommodations for patient's preferences in scheduling surgery, but sometimes the OR schedule or the surgeon's schedule prevents [place] from making those accommodations.  If  you have not heard from our office [phone]) in 5 working days, call the office and ask for your surgeon's nurse.  If you have other questions about your diagnosis, plan, or surgery, call the office and ask for your surgeon's nurse.  I recommended obtaining preoperative cardiac clearance for recommendations on management of anticoagualtion perioperatively:  1. Timing of holding anticoagulation 2. Need for any bridge therapy (SQ enoxaparin, IV heparin, IV Aggrastat, etc) preop/postop. 3. Desired timing of resumption of anticoagulation.  In general from Dr. Johney Maine' standpoint:  Aspirin is okay to continue perioperatively (81mg  or 325mg ) & does not need to be held  Hold warfarin 5 dayspreoperatively. Consider PT/INR level on arrival to short stay the day of surgery. No need to check PT/INR level on preop visit  Hold P2Y12 inibitors such as clopidrogel (Plavix) 4 dayspreoperatively  Hold direct thrombin / factor Xa inhibitors (Xerelto, Pradaxa, Eliquis, etc) 2 dayspreoperatively   Request clearance by cardiology to better assess operative risk & see if a reevaluation, further workup, etc is needed. Also recommendations on how medications such as for anticoagulation and blood pressure should be managed/held/restarted after surgery.  Written instructions provided The anatomy & physiology of the digestive tract was discussed. The pathophysiology of the colon was discussed. Natural history risks without surgery was discussed. I feel the risks of no intervention will lead to serious problems that outweigh the operative risks; therefore, I recommended a partial colectomy to remove the pathology. Minimally invasive (Robotic/Laparoscopic) & open techniques were discussed.  Risks such as bleeding, infection, abscess, leak, reoperation, possible ostomy, hernia, heart attack, death, and other risks were discussed. I noted a good likelihood this will help address the  problem. Goals of post-operative recovery were discussed as well. Need for adequate nutrition, daily bowel regimen and healthy physical activity, to optimize recovery was noted as well. We will work to minimize complications. Educational materials were available as well. Questions were answered. The patient expresses understanding & wishes to proceed with surgery.  Pt Education - [facility] Colon Bowel Prep 2018 ERAS/Miralax/Antibiotics Started Neomycin Sulfate 500 MG Oral Tablet, 2 (two) Tablet SEE NOTE, #6, 12/11/2017, No Refill. Local Order: TAKE TWO TABLETS AT 2 PM, 3 PM, AND 10 PM THE DAY PRIOR TO SURGERY Started Flagyl 500 MG Oral Tablet, 2 (two) Tablet SEE NOTE, #6, 12/11/2017, No Refill. Local Order: Take at 2pm, 3pm, and 10pm the day prior to your colon operation Pt Education - Pamphlet Given - Laparoscopic Colorectal Surgery: discussed with patient and provided information. Pt Education - [facility] Colectomy post-op instructions: discussed with patient and provided information.  Adin Hector,  MD, FACS, MASCRS [facility]astrointestinal [facility] Surgery    [address], [place], [place] [address] [phone] Main / Paging [phone] Fax

## 2017-12-11 NOTE — Telephone Encounter (Signed)
She takes Mag 500mg  QD

## 2017-12-11 NOTE — Telephone Encounter (Signed)
That's all I got

## 2017-12-11 NOTE — Telephone Encounter (Signed)
New Message:     Patient states she is having severe leg cramps for 3 weeks at night.  The pain has started during the day as well.

## 2017-12-19 ENCOUNTER — Telehealth: Payer: Self-pay | Admitting: *Deleted

## 2017-12-19 NOTE — Telephone Encounter (Signed)
   [facility] Pre-operative Risk Assessment    Request for surgical clearance:  1. What type of surgery is being performed? COLOVESICAL FISTULA   2. When is this surgery scheduled? TBD   3. What type of clearance is required (medical clearance vs. Pharmacy clearance to hold med vs. Both)?  BOTH  4. Are there any medications that need to be held prior to surgery and how long? ELIQUIS AND ASA--NOT SPECIFIED HOW LONG TO HOLD   5. Practice name and name of physician performing surgery?  [facility]---DR. CHRISTOPHER WHITE   6. What is your office phone number [phone]    7.   What is your office fax number [phone]  8.   Anesthesia type (None, local, MAC, general) ?  GENERAL    Nuala Alpha 12/19/2017, 11:53 AM  _________________________________________________________________   (provider comments below)

## 2017-12-19 NOTE — Telephone Encounter (Signed)
   Primary Cardiologist:Henry Nicholes Stairs III, MD  Chart reviewed as part of pre-operative protocol coverage. Pt contacted and stated that surgery is not planned until 6 more weeks. She has f/u with [name] on 10/10. He will assess and determine clearance status.   Lyda Jester, PA-C 12/19/2017, 1:54 PM

## 2017-12-26 ENCOUNTER — Telehealth: Payer: Self-pay | Admitting: Gastroenterology

## 2017-12-26 ENCOUNTER — Ambulatory Visit: Payer: Medicare Other | Admitting: Gastroenterology

## 2017-12-26 ENCOUNTER — Encounter (INDEPENDENT_AMBULATORY_CARE_PROVIDER_SITE_OTHER): Payer: Self-pay

## 2017-12-26 ENCOUNTER — Encounter: Payer: Self-pay | Admitting: Gastroenterology

## 2017-12-26 VITALS — BP 120/64 | HR 86 | Ht 64.5 in | Wt 195.5 lb

## 2017-12-26 DIAGNOSIS — Z86010 Personal history of colonic polyps: Secondary | ICD-10-CM | POA: Diagnosis not present

## 2017-12-26 DIAGNOSIS — K632 Fistula of intestine: Secondary | ICD-10-CM

## 2017-12-26 MED ORDER — PEG 3350-KCL-NA BICARB-NACL 420 G PO SOLR: 4000.0000 mL | ORAL | 0 refills | Status: DC

## 2017-12-26 NOTE — Progress Notes (Signed)
Review of pertinent gastrointestinal problems: 1. Personal history of precancerous colon polyps; Colonoscopy 2008, several small TAs, was recommended to have repeat colonoscopy in 3 years.  Colonoscopy 11/2014 Dr. Ardis Hughs found three subCM polyps; one was TA, two were SSPolyps; she was recommended to have recall colonoscopy at 3 year interval.   HPI: This is a very pleasant 77 year old woman whom I last saw about a year ago around the time of some bowel changes.  Since then she has been doing very well up until about a month ago when she started having some urine changes.  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  Interestingly she has never had acute diverticulitis.  She is here to discuss colonoscopy prior to her colovesicular repair surgery and also colonoscopy for personal history of colon polyps.  She has a neurostimulator in place and this has had to be turned off before previous cardioversion events for atrial fibrillation  She has atrial fibrillation and is on Eliquis for it.  She has no bowel issues.  Specifically no constipation diarrhea or overt bleeding.  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  Constitutional:  No unintentional weight loss   Past Medical History:  Diagnosis Date  . Addison's disease ([place])   . Anxiety   . Arthritis    "everywhere"  . Atrial fibrillation (HCC)    Eliquis  . Chronic bronchitis ([place])    "although I didn't get it this year" (07/23/2017)  . 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  last knee surgery dr. Elmyra Ricks gave large dose of hydrocortisal and avoided the hypotensive side effectas of addison's disease.  . Coronary artery disease   . Depression   . GERD  (gastroesophageal reflux disease)   . Hyperlipemia   . Hypertension   . Hypothyroidism   . Lumbar radiculopathy 04/27/2014  . Migraine    "in my 20's" (07/23/2017)  . Recurrent upper respiratory infection (URI)     Past Surgical History:  Procedure Laterality Date  . ACHILLES TENDON SURGERY Left 07/16/2014   bone spur removed  . ANTERIOR LUMBAR FUSION  1980   L4-5  . BACK SURGERY    . CARDIAC CATHETERIZATION  2013  . CARDIAC CATHETERIZATION N/A 09/02/2014   Procedure: Left Heart Cath and Coronary Angiography;  Surgeon: Wellington Hampshire, MD;  Location: [place] [facility];  Service: Cardiovascular;  Laterality: N/A;  . CARDIOVERSION N/A 10/22/2015   Procedure: CARDIOVERSION;  Surgeon: Sanda Klein, MD;  Location: [place] [facility];  Service: Cardiovascular;  Laterality: N/A;  . CARDIOVERSION N/A 06/07/2017   Procedure: CARDIOVERSION;  Surgeon: Larey Dresser, MD;  Location: [facility];  Service: Cardiovascular;  Laterality: N/A;  . CARDIOVERSION N/A 07/05/2017   Procedure: CARDIOVERSION;  Surgeon: Pixie Casino, MD;  Location: [place];  Service: Cardiovascular;  Laterality: N/A;  . CARPAL TUNNEL RELEASE Right   . CATARACT EXTRACTION, BILATERAL Bilateral   . CORONARY ARTERY BYPASS GRAFT  06/26/2011   Procedure: CORONARY ARTERY BYPASS GRAFTING (CABG);  Surgeon: Melrose Nakayama, MD;  Location: [place];  Service: Open Heart Surgery;  Laterality: N/A;  times using Greater Saphenous Vein Graft harvested endoscopically from right leg  . DIAGNOSTIC LAPAROSCOPY    . DILATION AND CURETTAGE OF UTERUS    . JOINT REPLACEMENT    .  KNEE ARTHROSCOPY Bilateral   . LAPAROSCOPIC CHOLECYSTECTOMY    . REVISION TOTAL KNEE ARTHROPLASTY Right   . SPINAL CORD STIMULATOR IMPLANT  12/10/15  . TONSILLECTOMY    . TOTAL KNEE ARTHROPLASTY Bilateral     Current Outpatient Medications  Medication Sig Dispense Refill  . acetaminophen (TYLENOL) 500 MG tablet Take 500 mg by mouth daily as needed for headache.     . apixaban (ELIQUIS) 5 MG TABS tablet Take 5 mg by mouth 2 (two) times daily.    . Ascorbic Acid (VITAMIN C) 100 MG tablet Take 100 mg by mouth daily.    . aspirin EC 81 MG tablet Take 81 mg by mouth every Monday, Wednesday, and Friday.    . clonazePAM (KLONOPIN) 0.5 MG tablet Take half (1/2) to one (1) tablet (0.25 mg to 0.5 mg) by mouth twice daily as needed for anxiety.    . Cyanocobalamin 1000 MCG/ML KIT Inject 1 mL into the muscle See admin instructions. Take one (1) injection as directed every month.    . diltiazem (CARDIZEM CD) 180 MG 24 hr capsule Take 1 capsule (180 mg total) by mouth at bedtime.    . dofetilide (TIKOSYN) 125 MCG capsule Take 1 capsule (125 mcg total) by mouth 2 (two) times daily. 180 capsule 1  . FLUoxetine (PROZAC) 10 MG tablet Take 10 mg by mouth every morning.     . furosemide (LASIX) 20 MG tablet Take half tablet (10 mg) by mouth every third (3rd) day.    . hydrocortisone (CORTEF) 20 MG tablet Take 20 mg by mouth 2 (two) times daily.    . hyoscyamine (LEVSIN SL) 0.125 MG SL tablet Place 0.125 mg under the tongue daily as needed for cramping (IBS symptoms).    . levothyroxine (SYNTHROID, LEVOTHROID) 88 MCG tablet Take 1 tablet (88 mcg total) by mouth daily before breakfast. 30 tablet 0  . Magnesium 250 MG TABS Take 500 mg by mouth daily.     . Multiple Vitamin (VITAMIN E/FOLIC ACID/B-6/B-12 PO) Take by mouth daily.    . NITROSTAT 0.4 MG SL tablet ONE TABLET UNDER TONGUE AS NEEDED FOR CHEST PAIN AS DIRECTED 25 tablet 6  . omeprazole (PRILOSEC) 20 MG capsule Take 20 mg by mouth daily.     . potassium chloride (K-DUR) 10 MEQ tablet TAKE TWO TABLETS EVERY DAY 60 tablet 11  . Probiotic Product (PROBIOTIC FORMULA PO) Take 1 capsule by mouth at bedtime. For upset stomach     . valsartan (DIOVAN) 80 MG tablet Take half tablet (40 mg) by mouth each morning. Take additional half (40 mg) by mouth in the afternoon as needed for BP greater than 140/80. 90 tablet 3  . Vitamin D,  Ergocalciferol, (DRISDOL) 50000 UNITS CAPS Take 50,000 Units by mouth 2 (two) times a week. Takes on Tuesdays and Fridays     No current facility-administered medications for this visit.     Allergies as of 12/26/2017 - Review Complete 12/26/2017  Allergen Reaction Noted  . Zanaflex [tizanidine] Other (See Comments) 01/01/2015  . Percocet [oxycodone-acetaminophen] Other (See Comments) 06/23/2011  . Tramadol Other (See Comments) 06/19/2017  . Sulfa antibiotics Itching 06/23/2011  . Sulfacetamide sodium Itching 01/01/2015    Family History  Problem Relation Age of Onset  . Heart attack Father   . Hypertension Mother   . Stroke Mother   . Hypertension Maternal Grandfather   . Colon cancer Neg Hx     Social History   Socioeconomic History  . Marital   status: Widowed    Spouse name: Not on file  . Number of children: Not on file  . Years of education: Not on file  . Highest education level: Not on file  Occupational History  . Occupation: Retired  Scientific laboratory technician  . Financial resource strain: Not on file  . Food insecurity:    Worry: Not on file    Inability: Not on file  . Transportation needs:    Medical: Not on file    Non-medical: Not on file  Tobacco Use  . Smoking status: Passive Smoke Exposure - Never Smoker  . Smokeless tobacco: Never Used  . Tobacco comment: "husband passed in 2010; father passed 1978"  Substance and Sexual Activity  . Alcohol use: Yes    Alcohol/week: 0.0 standard drinks    Comment: glass of wine occasionally   . Drug use: No  . Sexual activity: Not Currently    Birth control/protection: Post-menopausal  Lifestyle  . Physical activity:    Days per week: Not on file    Minutes per session: Not on file  . Stress: Not on file  Relationships  . Social connections:    Talks on phone: Not on file    Gets together: Not on file    Attends religious service: Not on file    Active member of club or organization: Not on file    Attends meetings of  clubs or organizations: Not on file    Relationship status: Not on file  . Intimate partner violence:    Fear of current or ex partner: Not on file    Emotionally abused: Not on file    Physically abused: Not on file    Forced sexual activity: Not on file  Other Topics Concern  . Not on file  Social History Narrative  . Not on file     Physical Exam: BP 120/64   Pulse 86   Ht 5' 4.5" (1.638 m)   Wt 195 lb 8 oz (88.7 kg)   BMI 33.04 kg/m  Constitutional: generally well-appearing Psychiatric: alert and oriented x3 Abdomen: soft, nontender, nondistended, no obvious ascites, no peritoneal signs, normal bowel sounds No peripheral edema noted in lower extremities  Assessment and plan: 77 y.o. female with personal history of precancerous colon polyps, colovesicular fistula, chronic atrial fibrillation on blood thinner  First we will communicate with her cardiologist about her holding her Eliquis for 2 days prior to a colonoscopy since there is the increased risk of bleeding complications if she were to remain on the Eliquis.  Second she tells me that she has had to have her neural stimulator turned off around the time of cardioversions.  She has a rep from [employer] help with that the day of the procedure.  We will asked that that be turned off for the colonoscopy as well in case I need to use electrocautery for polyp resection or other.  She is due around now for colonoscopy for polyp surveillance, this should be done prior to her colovesicular repair.  Please see the "Patient Instructions" section for addition details about the plan.  Owens Loffler, MD [facility] 12/26/2017, 3:10 PM

## 2017-12-26 NOTE — H&P (View-Only) (Signed)
Review of pertinent gastrointestinal problems: 1. Personal history of precancerous colon polyps; Colonoscopy 2008, several small TAs, was recommended to have repeat colonoscopy in 3 years.  Colonoscopy 11/2014 Dr. Ardis Hughs found three subCM polyps; one was TA, two were SSPolyps; she was recommended to have recall colonoscopy at 3 year interval.   HPI: This is a very pleasant 77 year old woman whom I last saw about a year ago around the time of some bowel changes.  Since then she has been doing very well up until about a month ago when she started having some urine changes.  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  Interestingly she has never had acute diverticulitis.  She is here to discuss colonoscopy prior to her colovesicular repair surgery and also colonoscopy for personal history of colon polyps.  She has a neurostimulator in place and this has had to be turned off before previous cardioversion events for atrial fibrillation  She has atrial fibrillation and is on Eliquis for it.  She has no bowel issues.  Specifically no constipation diarrhea or overt bleeding.  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  Constitutional:  No unintentional weight loss   Past Medical History:  Diagnosis Date  . Addison's disease ([place])   . Anxiety   . Arthritis    "everywhere"  . Atrial fibrillation (HCC)    Eliquis  . Chronic bronchitis ([place])    "although I didn't get it this year" (07/23/2017)  . 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  last knee surgery dr. Elmyra Ricks gave large dose of hydrocortisal and avoided the hypotensive side effectas of addison's disease.  . Coronary artery disease   . Depression   . GERD  (gastroesophageal reflux disease)   . Hyperlipemia   . Hypertension   . Hypothyroidism   . Lumbar radiculopathy 04/27/2014  . Migraine    "in my 20's" (07/23/2017)  . Recurrent upper respiratory infection (URI)     Past Surgical History:  Procedure Laterality Date  . ACHILLES TENDON SURGERY Left 07/16/2014   bone spur removed  . ANTERIOR LUMBAR FUSION  1980   L4-5  . BACK SURGERY    . CARDIAC CATHETERIZATION  2013  . CARDIAC CATHETERIZATION N/A 09/02/2014   Procedure: Left Heart Cath and Coronary Angiography;  Surgeon: Wellington Hampshire, MD;  Location: [place] [facility];  Service: Cardiovascular;  Laterality: N/A;  . CARDIOVERSION N/A 10/22/2015   Procedure: CARDIOVERSION;  Surgeon: Sanda Klein, MD;  Location: [place] [facility];  Service: Cardiovascular;  Laterality: N/A;  . CARDIOVERSION N/A 06/07/2017   Procedure: CARDIOVERSION;  Surgeon: Larey Dresser, MD;  Location: [facility] END[facility];  Service: Cardiovascular;  Laterality: N/A;  . CARDIOVERSION N/A 07/05/2017   Procedure: CARDIOVERSION;  Surgeon: Pixie Casino, MD;  Location: [place];  Service: Cardiovascular;  Laterality: N/A;  . CARPAL TUNNEL RELEASE Right   . CATARACT EXTRACTION, BILATERAL Bilateral   . CORONARY ARTERY BYPASS GRAFT  06/26/2011   Procedure: CORONARY ARTERY BYPASS GRAFTING (CABG);  Surgeon: Melrose Nakayama, MD;  Location: [place];  Service: Open Heart Surgery;  Laterality: N/A;  times using Greater Saphenous Vein Graft harvested endoscopically from right leg  . DIAGNOSTIC LAPAROSCOPY    . DILATION AND CURETTAGE OF UTERUS    . JOINT REPLACEMENT    .  KNEE ARTHROSCOPY Bilateral   . LAPAROSCOPIC CHOLECYSTECTOMY    . REVISION TOTAL KNEE ARTHROPLASTY Right   . SPINAL CORD STIMULATOR IMPLANT  12/10/15  . TONSILLECTOMY    . TOTAL KNEE ARTHROPLASTY Bilateral     Current Outpatient Medications  Medication Sig Dispense Refill  . acetaminophen (TYLENOL) 500 MG tablet Take 500 mg by mouth daily as needed for headache.     [name] apixaban (ELIQUIS) 5 MG TABS tablet Take 5 mg by mouth 2 (two) times daily.    . Ascorbic Acid (VITAMIN C) 100 MG tablet Take 100 mg by mouth daily.    [name] aspirin EC 81 MG tablet Take 81 mg by mouth every Monday, Wednesday, and Friday.    . clonazePAM (KLONOPIN) 0.5 MG tablet Take half (1/2) to one (1) tablet (0.25 mg to 0.5 mg) by mouth twice daily as needed for anxiety.    . Cyanocobalamin 1000 MCG/ML KIT Inject 1 mL into the muscle See admin instructions. Take one (1) injection as directed every month.    . diltiazem (CARDIZEM CD) 180 MG 24 hr capsule Take 1 capsule (180 mg total) by mouth at bedtime.    . dofetilide (TIKOSYN) 125 MCG capsule Take 1 capsule (125 mcg total) by mouth 2 (two) times daily. 180 capsule 1  . FLUoxetine (PROZAC) 10 MG tablet Take 10 mg by mouth every morning.     . furosemide (LASIX) 20 MG tablet Take half tablet (10 mg) by mouth every third (3rd) day.    . hydrocortisone (CORTEF) 20 MG tablet Take 20 mg by mouth 2 (two) times daily.    . hyoscyamine (LEVSIN SL) 0.125 MG SL tablet Place 0.125 mg under the tongue daily as needed for cramping (IBS symptoms).    [name] levothyroxine (SYNTHROID, LEVOTHROID) 88 MCG tablet Take 1 tablet (88 mcg total) by mouth daily before breakfast. 30 tablet 0  . Magnesium 250 MG TABS Take 500 mg by mouth daily.     . Multiple Vitamin (VITAMIN E/FOLIC ZHGD/J-2/E-26 PO) Take by mouth daily.    [name] NITROSTAT 0.4 MG SL tablet ONE TABLET UNDER TONGUE AS NEEDED FOR CHEST PAIN AS DIRECTED 25 tablet 6  . omeprazole (PRILOSEC) 20 MG capsule Take 20 mg by mouth daily.     . potassium chloride (K-DUR) 10 MEQ tablet TAKE TWO TABLETS EVERY DAY 60 tablet 11  . Probiotic Product (PROBIOTIC FORMULA PO) Take 1 capsule by mouth at bedtime. For upset stomach     . valsartan (DIOVAN) 80 MG tablet Take half tablet (40 mg) by mouth each morning. Take additional half (40 mg) by mouth in the afternoon as needed for BP greater than 140/80. 90 tablet 3  . Vitamin D,  Ergocalciferol, (DRISDOL) 50000 UNITS CAPS Take 50,000 Units by mouth 2 (two) times a week. Takes on Tuesdays and Fridays     No current facility-administered medications for this visit.     Allergies as of 12/26/2017 - Review Complete 12/26/2017  Allergen Reaction Noted  . Zanaflex [tizanidine] Other (See Comments) 01/01/2015  . Percocet [oxycodone-acetaminophen] Other (See Comments) 06/23/2011  . Tramadol Other (See Comments) 06/19/2017  . Sulfa antibiotics Itching 06/23/2011  . Sulfacetamide sodium Itching 01/01/2015    Family History  Problem Relation Age of Onset  . Heart attack Father   . Hypertension Mother   . Stroke Mother   . Hypertension Maternal Grandfather   . Colon cancer Neg Hx     Social History   Socioeconomic History  . Marital  status: Widowed    Spouse name: Not on file  . Number of children: Not on file  . Years of education: Not on file  . Highest education level: Not on file  Occupational History  . Occupation: Retired  Scientific laboratory technician  . Financial resource strain: Not on file  . Food insecurity:    Worry: Not on file    Inability: Not on file  . Transportation needs:    Medical: Not on file    Non-medical: Not on file  Tobacco Use  . Smoking status: Passive Smoke Exposure - Never Smoker  . Smokeless tobacco: Never Used  . Tobacco comment: "husband passed in 2010; father passed 1978"  Substance and Sexual Activity  . Alcohol use: Yes    Alcohol/week: 0.0 standard drinks    Comment: glass of wine occasionally   . Drug use: No  . Sexual activity: Not Currently    Birth control/protection: Post-menopausal  Lifestyle  . Physical activity:    Days per week: Not on file    Minutes per session: Not on file  . Stress: Not on file  Relationships  . Social connections:    Talks on phone: Not on file    Gets together: Not on file    Attends religious service: Not on file    Active member of club or organization: Not on file    Attends meetings of  clubs or organizations: Not on file    Relationship status: Not on file  . Intimate partner violence:    Fear of current or ex partner: Not on file    Emotionally abused: Not on file    Physically abused: Not on file    Forced sexual activity: Not on file  Other Topics Concern  . Not on file  Social History Narrative  . Not on file     Physical Exam: BP 120/64   Pulse 86   Ht 5' 4.5" (1.638 m)   Wt 195 lb 8 oz (88.7 kg)   BMI 33.04 kg/m  Constitutional: generally well-appearing Psychiatric: alert and oriented x3 Abdomen: soft, nontender, nondistended, no obvious ascites, no peritoneal signs, normal bowel sounds No peripheral edema noted in lower extremities  Assessment and plan: 77 y.o. female with personal history of precancerous colon polyps, colovesicular fistula, chronic atrial fibrillation on blood thinner  First we will communicate with her cardiologist about her holding her Eliquis for 2 days prior to a colonoscopy since there is the increased risk of bleeding complications if she were to remain on the Eliquis.  Second she tells me that she has had to have her neural stimulator turned off around the time of cardioversions.  She has a rep from [employer] help with that the day of the procedure.  We will asked that that be turned off for the colonoscopy as well in case I need to use electrocautery for polyp resection or other.  She is due around now for colonoscopy for polyp surveillance, this should be done prior to her colovesicular repair.  Please see the "Patient Instructions" section for addition details about the plan.  Owens Loffler, MD [facility] 12/26/2017, 3:10 PM

## 2017-12-26 NOTE — Patient Instructions (Addendum)
You will be set up for a colonoscopy for personal history of precancerous colon polyps and also for your colovesicular fistula.  You should hold your Eliquis for 2 days prior to the colonoscopy.  We will communicate with your cardiologist to make sure that they are okay with that recommendation.  This should be at August neural stimulator rep will need to be present to turn your device off   Normal BMI (Body Mass Index- based on height and weight) is between 23 and 30. Your BMI today is Body mass index is 33.04 kg/m. [name] Please consider follow up  regarding your BMI with your Primary Care Provider.

## 2017-12-26 NOTE — Telephone Encounter (Signed)
[facility] Pre-operative Risk Assessment     Request for surgical clearance:     Endoscopy Procedure  What type of surgery is being performed?     Colonoscopy  When is this surgery scheduled?     01/17/18  What type of clearance is required ?   Pharmacy  Are there any medications that need to be held prior to surgery and how long? Eliquis  Practice name and name of physician performing surgery?      [facility]  What is your office phone and fax number?      Phone- [phone]  [phone]  Anesthesia type (None, local, MAC, general) ?       MAC

## 2017-12-26 NOTE — Telephone Encounter (Signed)
Please address eliquis 

## 2017-12-27 ENCOUNTER — Telehealth: Payer: Self-pay

## 2017-12-27 NOTE — Telephone Encounter (Signed)
Spoke to patient to inform her that she can hold her Eliquis for 24 hours prior to 01/17/18 procedure per office protocol. Patient voiced understanding.

## 2017-12-27 NOTE — Telephone Encounter (Signed)
I spoke to [name] who is patients [employer] pain stimulator representative. She was informed that patient is having a colonoscopy at [facility] on 01/17/18 at 8;15am. We discussed the need to turn off the device prior to procedure in case there was a need to use electrocautery for polyp resection. Mrs [name] stated that patient understands how to turn off the device herself,and will contact her today regarding this matter. I also spoke to patient this morning,and she states she understands how to turn her device on and off. I told her she will be receiving a call from the rep today to go over any other question or concerns.

## 2017-12-27 NOTE — Telephone Encounter (Signed)
Patient with diagnosis of Afib on Eliquis for anticoagulation.    Procedure: colonscopy Date of procedure: 01/17/18  CHADS2-VASc score of  5 (CHF, HTN, AGE, DM2, stroke/tia x 2, CAD, AGE, female)  CrCl 38ml/min  Per office protocol, patient can hold Eliquis for 24 hours prior to procedure.

## 2018-01-03 ENCOUNTER — Ambulatory Visit (HOSPITAL_COMMUNITY)
Admission: RE | Admit: 2018-01-03 | Discharge: 2018-01-03 | Disposition: A | Discharge status: Home / Self Care | Payer: Medicare Other | Source: Ambulatory Visit | Attending: Family | Admitting: Family

## 2018-01-03 ENCOUNTER — Other Ambulatory Visit (HOSPITAL_COMMUNITY): Payer: Self-pay | Admitting: Internal Medicine

## 2018-01-03 ENCOUNTER — Encounter (HOSPITAL_COMMUNITY): Payer: Self-pay

## 2018-01-03 DIAGNOSIS — M79605 Pain in left leg: Secondary | ICD-10-CM | POA: Insufficient documentation

## 2018-01-03 DIAGNOSIS — E785 Hyperlipidemia, unspecified: Secondary | ICD-10-CM | POA: Insufficient documentation

## 2018-01-03 DIAGNOSIS — I2510 Atherosclerotic heart disease of native coronary artery without angina pectoris: Secondary | ICD-10-CM | POA: Diagnosis not present

## 2018-01-03 DIAGNOSIS — M79604 Pain in right leg: Secondary | ICD-10-CM | POA: Diagnosis present

## 2018-01-03 DIAGNOSIS — M79606 Pain in leg, unspecified: Secondary | ICD-10-CM

## 2018-01-03 DIAGNOSIS — I10 Essential (primary) hypertension: Secondary | ICD-10-CM | POA: Insufficient documentation

## 2018-01-10 ENCOUNTER — Encounter: Payer: Self-pay | Admitting: Interventional Cardiology

## 2018-01-10 ENCOUNTER — Ambulatory Visit: Payer: Medicare Other | Admitting: Interventional Cardiology

## 2018-01-10 VITALS — BP 126/76 | HR 92 | Ht 64.5 in | Wt 196.1 lb

## 2018-01-10 DIAGNOSIS — Z7901 Long term (current) use of anticoagulants: Secondary | ICD-10-CM | POA: Diagnosis not present

## 2018-01-10 DIAGNOSIS — E785 Hyperlipidemia, unspecified: Secondary | ICD-10-CM

## 2018-01-10 DIAGNOSIS — I25709 Atherosclerosis of coronary artery bypass graft(s), unspecified, with unspecified angina pectoris: Secondary | ICD-10-CM

## 2018-01-10 DIAGNOSIS — Z79899 Other long term (current) drug therapy: Secondary | ICD-10-CM | POA: Diagnosis not present

## 2018-01-10 DIAGNOSIS — I10 Essential (primary) hypertension: Secondary | ICD-10-CM

## 2018-01-10 DIAGNOSIS — Z01810 Encounter for preprocedural cardiovascular examination: Secondary | ICD-10-CM

## 2018-01-10 DIAGNOSIS — I480 Paroxysmal atrial fibrillation: Secondary | ICD-10-CM

## 2018-01-10 NOTE — Patient Instructions (Signed)
Medication Instructions:  Your physician recommends that you continue on your current medications as directed. Please refer to the Current Medication list given to you today.  If you need a refill on your cardiac medications before your next appointment, please call your pharmacy.   Lab work: None If you have labs (blood work) drawn today and your tests are completely normal, you will receive your results only by: [name] MyChart Message (if you have MyChart) OR . A paper copy in the mail If you have any lab test that is abnormal or we need to change your treatment, we will call you to review the results.  Testing/Procedures: None  Follow-Up:  You have been referred to our Lipid clinic for cholesterol maintenance.  At [facility], you and your health needs are our priority.  As part of our continuing mission to provide you with exceptional heart care, we have created designated Provider Care Teams.  These Care Teams include your primary Cardiologist (physician) and Advanced Practice Providers (APPs -  Physician Assistants and Nurse Practitioners) who all work together to provide you with the care you need, when you need it. You will need a follow up appointment in 6-9 months.  Please call our office 2 months in advance to schedule this appointment.  You may see Sinclair Grooms, MD or one of the following Advanced Practice Providers on your designated Care Team:   Truitt Merle, NP [name], NP . Kathyrn Drown, NP  Any Other Special Instructions Will Be Listed Below (If Applicable).

## 2018-01-10 NOTE — Progress Notes (Signed)
Cardiology Office Note:    Date:  01/10/2018   ID:  [name], DOB 05-08-1940, MRN [id number]  PCP:  Crist Infante, MD  Cardiologist:  Sinclair Grooms, MD   Referring MD: Crist Infante, MD   Chief Complaint  Patient presents with  . Atrial Fibrillation  . Coronary Artery Disease    History of Present Illness:    [name] is a 77 y.o. female with a hx of coronary artery disease with prior bypass grafting, atrial fibrillation resulting in acute diastolic heart failure, hypertension, chronic anticoagulation therapy with Eliquis and hyperlipidemia. Was on amiodarone which was subsequently discontinued.   She is doing well.  She has several procedures coming up.  She feels her heart is in rhythm.  She has not had chest pain.  She denies orthopnea, leg swelling, PND, and palpitations.  She has had no blood in her stool but did notice blood in her urine.  Following up on the hematuria led to identification of a colovesical fistula which needs to be prepared.  As part of preparation she will also need a colonoscopy.  She needs to be cleared for both procedures and with reference to handling of anticoagulation therapy.   Past Medical History:  Diagnosis Date  . Addison's disease ([place])   . Anxiety   . Arthritis    "everywhere"  . Atrial fibrillation (HCC)    Eliquis  . Chronic bronchitis ([place])    "although I didn't get it this year" (07/23/2017)  . 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  last knee surgery dr. Elmyra Ricks gave large dose of hydrocortisal and avoided the hypotensive side effectas of addison's disease.  . Coronary artery disease   . Depression   . GERD (gastroesophageal reflux disease)   . Hyperlipemia   . Hypertension   . Hypothyroidism   . Lumbar radiculopathy 04/27/2014  . Migraine    "in my 20's" (07/23/2017)  . Recurrent upper respiratory infection (URI)     Past Surgical History:  Procedure Laterality  Date  . ACHILLES TENDON SURGERY Left 07/16/2014   bone spur removed  . ANTERIOR LUMBAR FUSION  1980   L4-5  . BACK SURGERY    . CARDIAC CATHETERIZATION  2013  . CARDIAC CATHETERIZATION N/A 09/02/2014   Procedure: Left Heart Cath and Coronary Angiography;  Surgeon: Wellington Hampshire, MD;  Location: [facility];  Service: Cardiovascular;  Laterality: N/A;  . CARDIOVERSION N/A 10/22/2015   Procedure: CARDIOVERSION;  Surgeon: Sanda Klein, MD;  Location: [facility];  Service: Cardiovascular;  Laterality: N/A;  . CARDIOVERSION N/A 06/07/2017   Procedure: CARDIOVERSION;  Surgeon: Larey Dresser, MD;  Location: [facility];  Service: Cardiovascular;  Laterality: N/A;  . CARDIOVERSION N/A 07/05/2017   Procedure: CARDIOVERSION;  Surgeon: Pixie Casino, MD;  Location: [facility];  Service: Cardiovascular;  Laterality: N/A;  . CARPAL TUNNEL RELEASE Right   . CATARACT EXTRACTION, BILATERAL Bilateral   . CORONARY ARTERY BYPASS GRAFT  06/26/2011   Procedure: CORONARY ARTERY BYPASS GRAFTING (CABG);  Surgeon: Melrose Nakayama, MD;  Location: [facility];  Service: Open Heart Surgery;  Laterality: N/A;  times using Greater Saphenous Vein Graft harvested endoscopically from right leg  . DIAGNOSTIC LAPAROSCOPY    . DILATION AND CURETTAGE OF UTERUS    . JOINT REPLACEMENT    . KNEE ARTHROSCOPY Bilateral   . LAPAROSCOPIC CHOLECYSTECTOMY    . REVISION TOTAL KNEE ARTHROPLASTY Right   .  SPINAL CORD STIMULATOR IMPLANT  12/10/15  . TONSILLECTOMY    . TOTAL KNEE ARTHROPLASTY Bilateral     Current Medications: Current Meds  Medication Sig  . acetaminophen (TYLENOL) 500 MG tablet Take 500 mg by mouth daily as needed for headache.  [name] apixaban (ELIQUIS) 5 MG TABS tablet Take 5 mg by mouth 2 (two) times daily.  . Ascorbic Acid (VITAMIN C) 100 MG tablet Take 100 mg by mouth daily.  [name] aspirin EC 81 MG tablet Take 81 mg by mouth every Monday, Wednesday, and Friday.  . clonazePAM (KLONOPIN) 0.5 MG tablet Take  half (1/2) to one (1) tablet (0.25 mg to 0.5 mg) by mouth twice daily as needed for anxiety.  . Cyanocobalamin 1000 MCG/ML KIT Inject 1 mL into the muscle See admin instructions. Take one (1) injection as directed every month.  . diltiazem (CARDIZEM CD) 180 MG 24 hr capsule Take 1 capsule (180 mg total) by mouth at bedtime.  . dofetilide (TIKOSYN) 125 MCG capsule Take 1 capsule (125 mcg total) by mouth 2 (two) times daily.  [name] FLUoxetine (PROZAC) 10 MG tablet Take 10 mg by mouth every morning.   . furosemide (LASIX) 20 MG tablet Take half tablet (10 mg) by mouth every third (3rd) day.  . hydrocortisone (CORTEF) 20 MG tablet Take 20 mg by mouth 2 (two) times daily.  . hyoscyamine (LEVSIN SL) 0.125 MG SL tablet Place 0.125 mg under the tongue daily as needed for cramping (IBS symptoms).  [name] levothyroxine (SYNTHROID, LEVOTHROID) 88 MCG tablet Take 1 tablet (88 mcg total) by mouth daily before breakfast.  . Magnesium 250 MG TABS Take 500 mg by mouth daily.   . Multiple Vitamin (VITAMIN E/FOLIC JEHU/D-1/S-97 PO) Take by mouth daily.  [name] NITROSTAT 0.4 MG SL tablet ONE TABLET UNDER TONGUE AS NEEDED FOR CHEST PAIN AS DIRECTED  . omeprazole (PRILOSEC) 20 MG capsule Take 20 mg by mouth daily.   . polyethylene glycol-electrolytes (NULYTELY/GOLYTELY) 420 g solution Take 4,000 mLs by mouth as directed.  . potassium chloride (K-DUR) 10 MEQ tablet TAKE TWO TABLETS EVERY DAY  . Probiotic Product (PROBIOTIC FORMULA PO) Take 1 capsule by mouth at bedtime. For upset stomach   . rOPINIRole (REQUIP) 0.25 MG tablet Take one (1) tablet (0.25 mg) by mouth daily 2 to 3 hours prior to bedtime.  . valsartan (DIOVAN) 80 MG tablet Take half tablet (40 mg) by mouth each morning. Take additional half (40 mg) by mouth in the afternoon as needed for BP greater than 140/80.  . Vitamin D, Ergocalciferol, (DRISDOL) 50000 UNITS CAPS Take 50,000 Units by mouth 2 (two) times a week. Takes on Tuesdays and Fridays     Allergies:    Zanaflex [tizanidine]; Percocet [oxycodone-acetaminophen]; Tramadol; Sulfa antibiotics; and Sulfacetamide sodium   Social History   Socioeconomic History  . Marital status: Widowed    Spouse name: Not on file  . Number of children: Not on file  . Years of education: Not on file  . Highest education level: Not on file  Occupational History  . Occupation: Retired  Scientific laboratory technician  . Financial resource strain: Not on file  . Food insecurity:    Worry: Not on file    Inability: Not on file  . Transportation needs:    Medical: Not on file    Non-medical: Not on file  Tobacco Use  . Smoking status: Passive Smoke Exposure - Never Smoker  . Smokeless tobacco: Never Used  . Tobacco comment: "husband passed in  2010; father passed 31"  Substance and Sexual Activity  . Alcohol use: Yes    Alcohol/week: 0.0 standard drinks    Comment: glass of wine occasionally   . Drug use: No  . Sexual activity: Not Currently    Birth control/protection: Post-menopausal  Lifestyle  . Physical activity:    Days per week: Not on file    Minutes per session: Not on file  . Stress: Not on file  Relationships  . Social connections:    Talks on phone: Not on file    Gets together: Not on file    Attends religious service: Not on file    Active member of club or organization: Not on file    Attends meetings of clubs or organizations: Not on file    Relationship status: Not on file  Other Topics Concern  . Not on file  Social History Narrative  . Not on file     Family History: The patient's family history includes Heart attack in her father; Hypertension in her maternal grandfather and mother; Stroke in her mother. There is no history of Colon cancer.  ROS:   Please see the history of present illness.    Blood in her urine, anxiety, low back pain, left leg pain, fingers and hands cramping.  For things are concerning her: Upcoming colonoscopy with Dr. Ardis Hughs; 2.  Fingers cramp, 3 nocturnal left  leg pain and cramping, for colovesical fistula which needs surgical repair by Dr. Annye English all other systems reviewed and are negative.  EKGs/Labs/Other Studies Reviewed:    The following studies were reviewed today: No new data  EKG:  EKG is not ordered today.  The ekg performed July 8 reveals sinus rhythm, borderline first-degree AV block, nonspecific T wave abnormality.  Recent Labs: 09/25/2017: BUN 18; Creatinine, Ser 0.81; Hemoglobin 12.8; Magnesium 2.1; Platelets 226; Potassium 4.5; Sodium 139; TSH 0.282  Recent Lipid Panel    Component Value Date/Time   CHOL 100 09/02/2014 0346   TRIG 78 09/02/2014 0346   HDL 40 (L) 09/02/2014 0346   CHOLHDL 2.5 09/02/2014 0346   VLDL 16 09/02/2014 0346   LDLCALC 44 09/02/2014 0346    Physical Exam:    VS:  BP 126/76   Pulse 92   Ht 5' 4.5" (1.638 m)   Wt 196 lb 1.9 oz (89 kg)   BMI 33.14 kg/m     Wt Readings from Last 3 Encounters:  01/10/18 196 lb 1.9 oz (89 kg)  12/26/17 195 lb 8 oz (88.7 kg)  10/08/17 194 lb 12.8 oz (88.4 kg)     GEN:  Well nourished, well developed in no acute distress HEENT: Normal NECK: No JVD. LYMPHATICS: No lymphadenopathy CARDIAC: RRR, no murmur, no gallop, no edema. VASCULAR: 2+ bilateral radial pulses.  No bruits. RESPIRATORY:  Clear to auscultation without rales, wheezing or rhonchi  ABDOMEN: Soft, non-tender, non-distended, No pulsatile mass, MUSCULOSKELETAL: No deformity  SKIN: Warm and dry NEUROLOGIC:  Alert and oriented x 3 PSYCHIATRIC:  Normal affect   ASSESSMENT:    1. Coronary artery disease involving coronary bypass graft of native heart with angina pectoris ([place])   2. Preoperative cardiovascular examination   3. Chronic anticoagulation   4. On dofetilide therapy   5. Paroxysmal atrial fibrillation (HCC)   6. Essential hypertension   7. Hyperlipidemia, unspecified hyperlipidemia type    PLAN:    In order of problems listed above:  1. No angina or symptoms suggesting CAD  progression.  Will  continue attempts at secondary risk edification.  Primary target that needs control his lipids.  LDL is greater than 130.  She is unable to tolerate statins.  She has tried Repatha but developed an allergic reaction after 2 years.  We will send her back to the lipid clinic for help and possibly institution of Praluent therapy or Bempedoic acid. 2. She is cleared for upcoming colonoscopy and also for colovesical fistula repair by Dr. Ardis Hughs and Dr. Dema Severin respectively.  It is safe to discontinue anticoagulation therapy for up to 3 days prior to these procedures.  She would not need bridging. 3. Able to stop Eliquis 3 days prior to the above procedures without bridging. 4. Continue this medication being careful to keep potassium greater than 3.8 5. Controlled on dofetilide. 6. Adequate control with blood pressure less than or equal to 130/80 mmHg. 7. LDL target is 70.  Referred to the lipid clinic to see if Praluent or another agent can help [place] get LDL near 70.  Prolonged office visit.  Patient had multiple concerns.  I will send clearance notes to both Dr. Ardis Hughs and to Dr. Annye English.  Follow-up with me in 6 to 9 months.  She is cleared for upcoming procedures.  Risk will be low.   Medication Adjustments/Labs and Tests Ordered: Current medicines are reviewed at length with the patient today.  Concerns regarding medicines are outlined above.  Orders Placed This Encounter  Procedures  . AMB Referral to [facility]   No orders of the defined types were placed in this encounter.   Patient Instructions  Medication Instructions:  Your physician recommends that you continue on your current medications as directed. Please refer to the Current Medication list given to you today.  If you need a refill on your cardiac medications before your next appointment, please call your pharmacy.   Lab work: None If you have labs (blood work) drawn today and your tests  are completely normal, you will receive your results only by: [name] MyChart Message (if you have MyChart) OR . A paper copy in the mail If you have any lab test that is abnormal or we need to change your treatment, we will call you to review the results.  Testing/Procedures: None  Follow-Up:  You have been referred to our Lipid clinic for cholesterol maintenance.  At [facility], you and your health needs are our priority.  As part of our continuing mission to provide you with exceptional heart care, we have created designated Provider Care Teams.  These Care Teams include your primary Cardiologist (physician) and Advanced Practice Providers (APPs -  Physician Assistants and Nurse Practitioners) who all work together to provide you with the care you need, when you need it. You will need a follow up appointment in 6-9 months.  Please call our office 2 months in advance to schedule this appointment.  You may see Sinclair Grooms, MD or one of the following Advanced Practice Providers on your designated Care Team:   Truitt Merle, NP [name], NP . Kathyrn Drown, NP  Any Other Special Instructions Will Be Listed Below (If Applicable).         Signed, Sinclair Grooms, MD  01/10/2018 3:19 PM    [place]

## 2018-01-11 NOTE — Progress Notes (Signed)
Spoke to patient to inform her that she is clear to hold her blood thinner. She stated she spoke with dr Tamala Julian and Dr white and has full understanding of her up coming procedure.

## 2018-01-14 ENCOUNTER — Other Ambulatory Visit: Payer: Self-pay

## 2018-01-14 ENCOUNTER — Encounter (HOSPITAL_COMMUNITY): Payer: Self-pay | Admitting: *Deleted

## 2018-01-16 MED ORDER — BUPIVACAINE LIPOSOME 1.3 % IJ SUSP
20.0000 mL | INTRAMUSCULAR | Status: DC
  Filled 2018-01-16: qty 20

## 2018-01-16 NOTE — Anesthesia Preprocedure Evaluation (Addendum)
Anesthesia Evaluation  Patient identified by MRN, date of birth, ID band Patient awake    Reviewed: Allergy & Precautions, H&P , NPO status , Patient's Chart, lab work & pertinent test results  History of Anesthesia Complications (+) PONV and history of anesthetic complications  Airway Mallampati: II  TM Distance: >3 FB Neck ROM: Full    Dental no notable dental hx. (+) Teeth Intact, Dental Advisory Given   Pulmonary Recent URI ,    Pulmonary exam normal breath sounds clear to auscultation       Cardiovascular hypertension, + angina + CAD  Normal cardiovascular exam+ dysrhythmias Atrial Fibrillation  Rhythm:Regular Rate:Normal  Echo 09/2015 Study Conclusions  - Left ventricle: The cavity size was normal. Wall thickness was   increased in a pattern of moderate LVH. Systolic function was   vigorous. The estimated ejection fraction was in the range of 65%   to 70%. - Mitral valve: There was mild regurgitation. - Pulmonary arteries: PA peak pressure: 35 mm Hg (S).   Neuro/Psych  Headaches, Anxiety Depression    GI/Hepatic Neg liver ROS, GERD  Medicated,  Endo/Other  Hypothyroidism   Renal/GU negative Renal ROS     Musculoskeletal  (+) Arthritis ,   Abdominal (+) + obese,   Peds  Hematology negative hematology ROS (+)   Anesthesia Other Findings   Reproductive/Obstetrics                            Anesthesia Physical Anesthesia Plan  ASA: III  Anesthesia Plan: MAC   Post-op Pain Management:    Induction:   PONV Risk Score and Plan: Treatment may vary due to age or medical condition  Airway Management Planned: Nasal Cannula and Natural Airway  Additional Equipment:   Intra-op Plan:   Post-operative Plan:   Informed Consent: I have reviewed the patients History and Physical, chart, labs and discussed the procedure including the risks, benefits and alternatives for the proposed  anesthesia with the patient or authorized representative who has indicated his/her understanding and acceptance.   Dental advisory given  Plan Discussed with:   Anesthesia Plan Comments: (Pre cancerous colon polyp, colovosicular fistula)       Anesthesia Quick Evaluation

## 2018-01-17 ENCOUNTER — Encounter (HOSPITAL_COMMUNITY): Payer: Self-pay | Admitting: Gastroenterology

## 2018-01-17 ENCOUNTER — Other Ambulatory Visit: Payer: Self-pay

## 2018-01-17 ENCOUNTER — Ambulatory Visit (HOSPITAL_COMMUNITY): Payer: Medicare Other | Admitting: Anesthesiology

## 2018-01-17 ENCOUNTER — Encounter (HOSPITAL_COMMUNITY)
Admission: RE | Disposition: A | Discharge status: Home / Self Care | Payer: Self-pay | Source: Ambulatory Visit | Attending: Gastroenterology

## 2018-01-17 ENCOUNTER — Ambulatory Visit (HOSPITAL_COMMUNITY)
Admission: RE | Admit: 2018-01-17 | Discharge: 2018-01-17 | Disposition: A | Discharge status: Home / Self Care | Payer: Medicare Other | Source: Ambulatory Visit | Attending: Gastroenterology | Admitting: Gastroenterology

## 2018-01-17 DIAGNOSIS — D122 Benign neoplasm of ascending colon: Secondary | ICD-10-CM

## 2018-01-17 DIAGNOSIS — Z7901 Long term (current) use of anticoagulants: Secondary | ICD-10-CM | POA: Insufficient documentation

## 2018-01-17 DIAGNOSIS — Z96653 Presence of artificial knee joint, bilateral: Secondary | ICD-10-CM | POA: Insufficient documentation

## 2018-01-17 DIAGNOSIS — K589 Irritable bowel syndrome without diarrhea: Secondary | ICD-10-CM | POA: Insufficient documentation

## 2018-01-17 DIAGNOSIS — I4891 Unspecified atrial fibrillation: Secondary | ICD-10-CM | POA: Diagnosis not present

## 2018-01-17 DIAGNOSIS — K635 Polyp of colon: Secondary | ICD-10-CM

## 2018-01-17 DIAGNOSIS — E785 Hyperlipidemia, unspecified: Secondary | ICD-10-CM | POA: Diagnosis not present

## 2018-01-17 DIAGNOSIS — F329 Major depressive disorder, single episode, unspecified: Secondary | ICD-10-CM | POA: Insufficient documentation

## 2018-01-17 DIAGNOSIS — Z7982 Long term (current) use of aspirin: Secondary | ICD-10-CM | POA: Insufficient documentation

## 2018-01-17 DIAGNOSIS — Z79899 Other long term (current) drug therapy: Secondary | ICD-10-CM | POA: Diagnosis not present

## 2018-01-17 DIAGNOSIS — F419 Anxiety disorder, unspecified: Secondary | ICD-10-CM | POA: Diagnosis not present

## 2018-01-17 DIAGNOSIS — K5730 Diverticulosis of large intestine without perforation or abscess without bleeding: Secondary | ICD-10-CM | POA: Insufficient documentation

## 2018-01-17 DIAGNOSIS — Z1211 Encounter for screening for malignant neoplasm of colon: Secondary | ICD-10-CM | POA: Diagnosis present

## 2018-01-17 DIAGNOSIS — Z885 Allergy status to narcotic agent status: Secondary | ICD-10-CM | POA: Diagnosis not present

## 2018-01-17 DIAGNOSIS — I10 Essential (primary) hypertension: Secondary | ICD-10-CM | POA: Diagnosis not present

## 2018-01-17 DIAGNOSIS — Z882 Allergy status to sulfonamides status: Secondary | ICD-10-CM | POA: Insufficient documentation

## 2018-01-17 DIAGNOSIS — Z951 Presence of aortocoronary bypass graft: Secondary | ICD-10-CM | POA: Insufficient documentation

## 2018-01-17 DIAGNOSIS — Z981 Arthrodesis status: Secondary | ICD-10-CM | POA: Insufficient documentation

## 2018-01-17 DIAGNOSIS — Z7722 Contact with and (suspected) exposure to environmental tobacco smoke (acute) (chronic): Secondary | ICD-10-CM | POA: Insufficient documentation

## 2018-01-17 DIAGNOSIS — K632 Fistula of intestine: Secondary | ICD-10-CM

## 2018-01-17 DIAGNOSIS — I2510 Atherosclerotic heart disease of native coronary artery without angina pectoris: Secondary | ICD-10-CM | POA: Diagnosis not present

## 2018-01-17 DIAGNOSIS — Z860101 Personal history of adenomatous and serrated colon polyps: Secondary | ICD-10-CM

## 2018-01-17 DIAGNOSIS — Z6832 Body mass index (BMI) 32.0-32.9, adult: Secondary | ICD-10-CM | POA: Insufficient documentation

## 2018-01-17 DIAGNOSIS — M1990 Unspecified osteoarthritis, unspecified site: Secondary | ICD-10-CM | POA: Diagnosis not present

## 2018-01-17 DIAGNOSIS — K219 Gastro-esophageal reflux disease without esophagitis: Secondary | ICD-10-CM | POA: Diagnosis not present

## 2018-01-17 DIAGNOSIS — Z86010 Personal history of colonic polyps: Secondary | ICD-10-CM | POA: Diagnosis not present

## 2018-01-17 DIAGNOSIS — E271 Primary adrenocortical insufficiency: Secondary | ICD-10-CM | POA: Diagnosis not present

## 2018-01-17 DIAGNOSIS — E039 Hypothyroidism, unspecified: Secondary | ICD-10-CM | POA: Diagnosis not present

## 2018-01-17 DIAGNOSIS — Z8249 Family history of ischemic heart disease and other diseases of the circulatory system: Secondary | ICD-10-CM | POA: Insufficient documentation

## 2018-01-17 DIAGNOSIS — Z888 Allergy status to other drugs, medicaments and biological substances status: Secondary | ICD-10-CM | POA: Insufficient documentation

## 2018-01-17 DIAGNOSIS — E669 Obesity, unspecified: Secondary | ICD-10-CM | POA: Insufficient documentation

## 2018-01-17 DIAGNOSIS — I340 Nonrheumatic mitral (valve) insufficiency: Secondary | ICD-10-CM | POA: Insufficient documentation

## 2018-01-17 HISTORY — PX: COLONOSCOPY WITH PROPOFOL: SHX5780

## 2018-01-17 HISTORY — PX: POLYPECTOMY: SHX5525

## 2018-01-17 SURGERY — COLONOSCOPY WITH PROPOFOL
Anesthesia: Monitor Anesthesia Care

## 2018-01-17 MED ORDER — BISACODYL 5 MG PO TBEC
20.0000 mg | DELAYED_RELEASE_TABLET | Freq: Once | ORAL | Status: DC
  Filled 2018-01-17: qty 4

## 2018-01-17 MED ORDER — POLYETHYLENE GLYCOL 3350 17 GM/SCOOP PO POWD
1.0000 | Freq: Once | ORAL | Status: DC
  Filled 2018-01-17: qty 255

## 2018-01-17 MED ORDER — METRONIDAZOLE 500 MG PO TABS
1000.0000 mg | ORAL_TABLET | ORAL | Status: DC
  Filled 2018-01-17: qty 2

## 2018-01-17 MED ORDER — LACTATED RINGERS IV SOLN
INTRAVENOUS | Status: DC
  Administered 2018-01-17: 08:00:00 via INTRAVENOUS

## 2018-01-17 MED ORDER — ENOXAPARIN SODIUM 40 MG/0.4ML ~~LOC~~ SOLN: 40.0000 mg | Freq: Once | SUBCUTANEOUS | Status: DC

## 2018-01-17 MED ORDER — ACETAMINOPHEN 500 MG PO TABS
1000.0000 mg | ORAL_TABLET | ORAL | Status: DC
  Filled 2018-01-17: qty 2

## 2018-01-17 MED ORDER — PROPOFOL 10 MG/ML IV BOLUS
INTRAVENOUS | Status: AC
  Filled 2018-01-17: qty 20

## 2018-01-17 MED ORDER — NEOMYCIN SULFATE 500 MG PO TABS
1000.0000 mg | ORAL_TABLET | ORAL | Status: DC
  Filled 2018-01-17: qty 2

## 2018-01-17 MED ORDER — GABAPENTIN 300 MG PO CAPS: 300.0000 mg | ORAL_CAPSULE | ORAL | Status: DC

## 2018-01-17 MED ORDER — SODIUM CHLORIDE 0.9 % IV SOLN: 1.0000 "application " | INTRAVENOUS | Status: DC

## 2018-01-17 MED ORDER — PROPOFOL 10 MG/ML IV BOLUS
INTRAVENOUS | Status: AC
  Filled 2018-01-17: qty 40

## 2018-01-17 MED ORDER — PROPOFOL 500 MG/50ML IV EMUL
INTRAVENOUS | Status: DC | PRN
  Administered 2018-01-17: 150 ug/kg/min via INTRAVENOUS

## 2018-01-17 MED ORDER — SODIUM CHLORIDE 0.9 % IV SOLN: 2.0000 g | INTRAVENOUS | Status: DC

## 2018-01-17 MED ORDER — LIDOCAINE 2% (20 MG/ML) 5 ML SYRINGE
INTRAMUSCULAR | Status: DC | PRN
  Administered 2018-01-17: 80 mg via INTRAVENOUS

## 2018-01-17 MED ORDER — ALVIMOPAN 12 MG PO CAPS: 12.0000 mg | ORAL_CAPSULE | ORAL | Status: DC

## 2018-01-17 MED ORDER — SODIUM CHLORIDE 0.9 % IV SOLN: INTRAVENOUS | Status: DC

## 2018-01-17 MED ORDER — ONDANSETRON HCL 4 MG/2ML IJ SOLN
INTRAMUSCULAR | Status: DC | PRN
  Administered 2018-01-17: 4 mg via INTRAVENOUS

## 2018-01-17 MED ORDER — PROPOFOL 10 MG/ML IV BOLUS
INTRAVENOUS | Status: DC | PRN
  Administered 2018-01-17: 20 mg via INTRAVENOUS

## 2018-01-17 SURGICAL SUPPLY — 21 items

## 2018-01-17 NOTE — Transfer of Care (Signed)
Immediate Anesthesia Transfer of Care Note  Patient: [name]  Procedure(s) Performed: COLONOSCOPY WITH PROPOFOL (N/A ) POLYPECTOMY  Patient Location: PACU  Anesthesia Type:MAC  Level of Consciousness: awake, alert  and oriented  Airway & Oxygen Therapy: Patient Spontanous Breathing and Patient connected to face mask oxygen  Post-op Assessment: Report given to RN and Post -op Vital signs reviewed and stable  Post vital signs: Reviewed and stable  Last Vitals:  Vitals Value Taken Time  BP    Temp    Pulse    Resp    SpO2      Last Pain:  Vitals:   01/17/18 0732  TempSrc: Oral  PainSc: 0-No pain         Complications: No apparent anesthesia complications

## 2018-01-17 NOTE — Interval H&P Note (Signed)
History and Physical Interval Note:  01/17/2018 7:18 AM  [name]  has presented today for surgery, with the diagnosis of Pre cancerous colon polyp, colovosicular fistula  The various methods of treatment have been discussed with the patient and family. After consideration of risks, benefits and other options for treatment, the patient has consented to  Procedure(s): COLONOSCOPY WITH PROPOFOL (N/A) as a surgical intervention .  The patient's history has been reviewed, patient examined, no change in status, stable for surgery.  I have reviewed the patient's chart and labs.  Questions were answered to the patient's satisfaction.     Milus Banister

## 2018-01-17 NOTE — Op Note (Signed)
[facility] Patient Name: [name] Procedure Date: 01/17/2018 MRN: [id number] Attending MD: Milus Banister , MD Date of Birth: 1941/02/18 CSN: [id number] Age: 77 Admit Type: Outpatient Procedure:                Colonoscopy Indications:              High risk colon cancer surveillance: Personal                            history of precancerous colon polyps;Colonoscopy                            2008,several small TAs, was recommended to have                            repeat colonoscopy in 3 years. Colonoscopy 11/2014                            Dr. Ardis Hughs found three subCM polyps; one was TA,                            two were SSPolyps; she was recommended to have                            recall colonoscopy at 3 year interval. Also                            recently diagnosed colovaginal fistula Providers:                Milus Banister, MD, Baird Cancer, RN, Charolette Child, Technician, Virgia Land, CRNA Referring MD:             Annye English, MD Medicines:                Monitored Anesthesia Care Complications:            No immediate complications. Estimated blood loss:                            None. Estimated Blood Loss:     Estimated blood loss: none. Procedure:                Pre-Anesthesia Assessment:                           - Prior to the procedure, a History and Physical                            was performed, and patient medications and                            allergies were reviewed. The patient's tolerance of  previous anesthesia was also reviewed. The risks                            and benefits of the procedure and the sedation                            options and risks were discussed with the patient.                            All questions were answered, and informed consent                            was obtained. Prior Anticoagulants: The patient has          taken Eliquis (apixaban), last dose was 2 days                            prior to procedure. ASA Grade Assessment: III - A                            patient with severe systemic disease. After                            reviewing the risks and benefits, the patient was                            deemed in satisfactory condition to undergo the                            procedure.                           After obtaining informed consent, the colonoscope                            was passed under direct vision. Throughout the                            procedure, the patient's blood pressure, pulse, and                            oxygen saturations were monitored continuously. The                            EC-[id number] ([id number]) scope was introduced through                            the anus and advanced to the the cecum, identified                            by appendiceal orifice and ileocecal valve. The                            colonoscopy was performed without difficulty. The  patient tolerated the procedure well. The quality                            of the bowel preparation was good. The ileocecal                            valve, appendiceal orifice, and rectum were                            photographed. Scope In: 8:43:29 AM Scope Out: 8:58:43 AM Scope Withdrawal Time: 0 hours 8 minutes 5 seconds  Total Procedure Duration: 0 hours 15 minutes 14 seconds  Findings:      A 3 mm polyp was found in the ascending colon. The polyp was sessile.       The polyp was removed with a cold snare. Resection and retrieval were       complete.      Multiple small and large-mouthed diverticula were found in the sigmoid       colon and distal descending colon. The colon in this region was       edematous, tortuous and narrowed.      The exam was otherwise without abnormality. Impression:               - One 3 mm polyp in the ascending colon, removed                             with a cold snare. Resected and retrieved.                           - Severe diverticulosis in the sigmoid colon and in                            the distal descending colon. The colon in this                            region was edematous, tortuous and narrowed. I did                            not identify the exact site of colovaginal fistula.                           - The examination was otherwise normal. Moderate Sedation:      N/A- Per Anesthesia Care Recommendation:           - Patient has a contact number available for                            emergencies. The signs and symptoms of potential                            delayed complications were discussed with the                            patient. Return to normal activities tomorrow.  Written discharge instructions were provided to the                            patient.                           - Resume previous diet.                           - Continue present medications.                           You will receive a letter within 2-3 weeks with the                            pathology results and my final recommendations.                           If the polyp(s) is proven to be 'pre-cancerous' on                            pathology, you will need repeat colonoscopy in 5                            years. Procedure Code(s):        --- Professional ---                           [phone], Colonoscopy, flexible; with removal of                            tumor(s), polyp(s), or other lesion(s) by snare                            technique Diagnosis Code(s):        --- Professional ---                           Z86.010, Personal history of colonic polyps                           D12.2, Benign neoplasm of ascending colon                           K57.30, Diverticulosis of large intestine without                            perforation or abscess without bleeding CPT copyright 2018  American Medical Association. All rights reserved. The codes documented in this report are preliminary and upon coder review may  be revised to meet current compliance requirements. Milus Banister, MD 01/17/2018 9:11:25 AM This report has been signed electronically. Number of Addenda: 0

## 2018-01-17 NOTE — Anesthesia Postprocedure Evaluation (Signed)
Anesthesia Post Note  Patient: CONSEPCION UTT  Procedure(s) Performed: COLONOSCOPY WITH PROPOFOL (N/A ) POLYPECTOMY     Patient location during evaluation: Endoscopy Anesthesia Type: MAC Level of consciousness: awake and alert Pain management: pain level controlled Vital Signs Assessment: post-procedure vital signs reviewed and stable Respiratory status: spontaneous breathing, nonlabored ventilation, respiratory function stable and patient connected to nasal cannula oxygen Cardiovascular status: stable and blood pressure returned to baseline Postop Assessment: no apparent nausea or vomiting Anesthetic complications: no    Last Vitals:  Vitals:   01/17/18 0909 01/17/18 0920  BP: 118/62 114/60  Pulse: 70 76  Resp: (!) 22 19  Temp: 36.8 C   SpO2: 100% 94%    Last Pain:  Vitals:   01/17/18 0920  TempSrc:   PainSc: 0-No pain                 Barnet Glasgow

## 2018-01-17 NOTE — Discharge Instructions (Signed)
YOU HAD AN ENDOSCOPIC PROCEDURE TODAY: Refer to the procedure report and other information in the discharge instructions given to you for any specific questions about what was found during the examination. If this information does not answer your questions, please call [place] office at [phone] to clarify.  ° °YOU SHOULD EXPECT: Some feelings of bloating in the abdomen. Passage of more gas than usual. Walking can help get rid of the air that was put into your GI tract during the procedure and reduce the bloating. If you had a lower endoscopy (such as a colonoscopy or flexible sigmoidoscopy) you may notice spotting of blood in your stool or on the toilet paper. Some abdominal soreness may be present for a day or two, also. ° °DIET: Your first meal following the procedure should be a light meal and then it is ok to progress to your normal diet. A half-sandwich or bowl of soup is an example of a good first meal. Heavy or fried foods are harder to digest and may make you feel nauseous or bloated. Drink plenty of fluids but you should avoid alcoholic beverages for 24 hours. If you had a esophageal dilation, please see attached instructions for diet.   ° °ACTIVITY: Your care partner should take you home directly after the procedure. You should plan to take it easy, moving slowly for the rest of the day. You can resume normal activity the day after the procedure however YOU SHOULD NOT DRIVE, use power tools, machinery or perform tasks that involve climbing or major physical exertion for 24 hours (because of the sedation medicines used during the test).  ° °SYMPTOMS TO REPORT IMMEDIATELY: °A gastroenterologist can be reached at any hour. Please call [phone]  for any of the following symptoms:  °Following lower endoscopy (colonoscopy, flexible sigmoidoscopy) °Excessive amounts of blood in the stool  °Significant tenderness, worsening of abdominal pains  °Swelling of the abdomen that is new, acute  °Fever of 100° or  higher  °Following upper endoscopy (EGD, EUS, ERCP, esophageal dilation) °Vomiting of blood or coffee ground material  °New, significant abdominal pain  °New, significant chest pain or pain under the shoulder blades  °Painful or persistently difficult swallowing  °New shortness of breath  °Black, tarry-looking or red, bloody stools ° °FOLLOW UP:  °If any biopsies were taken you will be contacted by phone or by letter within the next 1-3 weeks. Call [phone]  if you have not heard about the biopsies in 3 weeks.  °Please also call with any specific questions about appointments or follow up tests. ° °

## 2018-01-30 ENCOUNTER — Other Ambulatory Visit: Payer: Self-pay | Admitting: Interventional Cardiology

## 2018-02-03 NOTE — Progress Notes (Signed)
Patient ID: [name]                 DOB: 23-Aug-1940                    MRN: [id number]     HPI: CHARLEI RAMSARAN is a 77 y.o. female patient referred to lipid clinic by [name]. PMH is significant for coronary artery disease with prior bypass grafting, atrial fibrillation resulting in acute diastolic heart failure, hypertension, chronic anticoagulation therapy with Eliquisand hyperlipidemia. She has arterial calcification noted on CTA chest (05/2015). In June 2016, she underwent LHC, significant for three-vessel coronary artery disease with patent grafts. She was referred by [name] for possible initiation of PCSK9 inhitor therapy or bempedoic acid.   Patient with a history of multiple intolerances to antihyperlipidemics. She reports she can tolerate statins for about a year before developing muscle pains. She was started on Repatha ~2 years ago by Dr. Joylene Draft. About a year and half ago she reported a red, swollen, hard area where injection had been the previous days. She reports injecting Repatha into the same place on the same thigh for the duration of treatment with incorrect technique (likely injecting into muscle instead of fat). She also has tried Insurance risk surveyor. She would take whatever her doctor had samples of. She also reports previous intolerances to multiple statin therapies, reports she could tolerate them for about a year before developing symptoms. Intolerances include atorvastatin 22m daily (muscle pain), rosuvastatin 5 mg daily (muscle pain), pitavastatin 215mdaily (muscle pain), pravastatin (muscle pain), simvastatin (muscle pain), ezetimibe 10 mg twice weekly (muscle pain).    Current Medications: none Intolerances:  Repatha into the same place on the same thigh for the duration of treatment, does not appear to have been injecting into fat. Atorvastatin 2026maily (muscle pain), rosuvastatin 5 mg daily (muscle pain), pitavastatin 2mg24mily (muscle pain), pravastatin (muscle pain),  simvastatin (muscle pain), ezetimibe 10 mg twice weekly (muscle pain).   Risk Factors: CABG, family history LDL goal: < 70  Diet: Breakfast: eggs (~3x/week), toast, waffles, bacon/sausage once a week, cereal, blueberries; Lunch: yogurt and fruit, sandwich ([place]: eats out once a week (baked/broiled fish), picks up seafood, potatoes, vegetables, avoids red meat. Watches salt intake. She snacks on chips, cookies (no sugar). She takes hydrocortisone 20 mg twice daily, and attributes her grazing habits to it. Drinks half/half tea.   Exercise: Waiting for fistula surgery, avoiding exercise. Previously, takes [employer] water walking class (45 minutes) twice a week   Family History: The patient's family history includes heart attack (age 69) 65 her father; Hypertension in her maternal grandfather and mother; Stroke in her mother (died at 50).80here is no history of Colon cancer.  Social History: (-) tobacco, (-) alcohol (once a month)  Labs: 07/31/17: Tot 228, HDL 46, LDL 161, TG 104 (off therapy) 09/02/14: Tot 100, HDL 40, LDL 44, TG 78   Past Medical History:  Diagnosis Date  . Addison's disease (HCC)Cole. Anxiety   . Arthritis    "everywhere"  . Atrial fibrillation (HCC)    Eliquis  . Chronic bronchitis (HCC)Shirley "although I didn't get it this year" (07/23/2017)  . 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  last knee surgery dr. alluElmyra Rickse large dose of hydrocortisal and avoided the hypotensive side effectas of addison's disease.  . Coronary artery disease   . Depression   .  GERD (gastroesophageal reflux disease)   . Hyperlipemia   . Hypertension   . Hypothyroidism   . Lumbar radiculopathy 04/27/2014  . Migraine    "in my 20's" (07/23/2017)  . Recurrent upper respiratory infection (URI)     Current Outpatient Medications on File Prior to Visit  Medication Sig Dispense Refill  . acetaminophen (TYLENOL) 500 MG tablet Take 500 mg by  mouth daily as needed for headache.    [name] apixaban (ELIQUIS) 5 MG TABS tablet Take 5 mg by mouth 2 (two) times daily.    . Ascorbic Acid (VITAMIN C) 1000 MG tablet Take 1,000 mg by mouth daily.     [name] aspirin EC 81 MG tablet Take 81 mg by mouth every Monday, Wednesday, and Friday.    . clonazePAM (KLONOPIN) 0.5 MG tablet Take 0.25 mg by mouth 2 (two) times daily as needed for anxiety. Take half (1/2) to one (1) tablet (0.25 mg to 0.5 mg) by mouth twice daily as needed for anxiety.     . Cyanocobalamin 1000 MCG/ML KIT Inject 1 mL into the muscle every 30 (thirty) days. Take one (1) injection as directed every month.    . diltiazem (CARDIZEM CD) 180 MG 24 hr capsule Take 1 capsule (180 mg total) by mouth at bedtime.    . dofetilide (TIKOSYN) 125 MCG capsule TAKE ONE CAPSULE TWICE A DAY 180 capsule 3  . FLUoxetine (PROZAC) 10 MG tablet Take 10 mg by mouth every morning.     . furosemide (LASIX) 20 MG tablet Take 20 mg by mouth every 3 (three) days.     . hydrocortisone (CORTEF) 20 MG tablet Take 20 mg by mouth 2 (two) times daily.    . hyoscyamine (LEVSIN SL) 0.125 MG SL tablet Place 0.125 mg under the tongue daily as needed for cramping (IBS symptoms).    [name] levothyroxine (SYNTHROID, LEVOTHROID) 88 MCG tablet Take 1 tablet (88 mcg total) by mouth daily before breakfast. 30 tablet 0  . MAGNESIUM PO Take 1,000 mg by mouth daily.     [name] NITROSTAT 0.4 MG SL tablet ONE TABLET UNDER TONGUE AS NEEDED FOR CHEST PAIN AS DIRECTED (Patient taking differently: Place 0.4 mg under the tongue every 5 (five) minutes as needed for chest pain. ) 25 tablet 6  . omeprazole (PRILOSEC) 20 MG capsule Take 20 mg by mouth daily.     . polyethylene glycol-electrolytes (NULYTELY/GOLYTELY) 420 g solution Take 4,000 mLs by mouth as directed. 4000 mL 0  . potassium chloride (K-DUR) 10 MEQ tablet TAKE TWO TABLETS EVERY DAY (Patient taking differently: Take 20 mEq by mouth daily. ) 60 tablet 11  . Probiotic Product (PROBIOTIC FORMULA  PO) Take 1 capsule by mouth at bedtime.     . pyridoxine (B-6) 200 MG tablet Take 200 mg by mouth daily.    [name] rOPINIRole (REQUIP) 0.25 MG tablet Take 0.25 mg by mouth at bedtime.     . valsartan (DIOVAN) 80 MG tablet Take half tablet (40 mg) by mouth each morning. Take additional half (40 mg) by mouth in the afternoon as needed for BP greater than 140/80. (Patient taking differently: Take 80 mg by mouth at bedtime. Take half tablet (40 mg) by mouth each morning. Take additional half (40 mg) by mouth in the afternoon as needed for BP greater than 140/80. ) 90 tablet 3  . Vitamin D, Ergocalciferol, (DRISDOL) 50000 UNITS CAPS Take 50,000 Units by mouth 2 (two) times a week. Takes on Tuesdays and Fridays  No current facility-administered medications on file prior to visit.     Allergies  Allergen Reactions  . Percocet [Oxycodone-Acetaminophen] Other (See Comments)    Hallucination  . Tramadol Other (See Comments)    Hallucinations.  . Sulfa Antibiotics Itching    Vaginal itching  . Sulfacetamide Sodium Itching    Vaginal itching    Assessment/Plan:  1. Hyperlipidemia - Patient LDL above goal of < 70 mg/dL on no therapy. She has a history of CAD requiring CABG. Patient would benefit from intensive LDL reduction given personal cardiac history, family history of cardiac disease, and current LDL 161. Encouraged patient to continue with relatively healthy diet and to decrease chip intake. Bempedoic acid is not currently available and trials have closed. Patient would benefit from PCSK9 inhibitor therapy. Will obtain direct LDL today as patient is not fasting.  Will submit PA to insurance company. Patient is not eligible for patient assistance programs. Will follow up with patient after copay to ensure cost will not be prohibitive. Patient is willing to retrial statin therapy if cost is prohibitive. Follow up in lipid clinic with labs once therapy has been started.  Claiborne Billings, PharmD PGY2  Cardiology Pharmacy Resident 02/04/2018 4:34 PM

## 2018-02-04 ENCOUNTER — Ambulatory Visit (INDEPENDENT_AMBULATORY_CARE_PROVIDER_SITE_OTHER): Payer: Medicare Other | Admitting: Pharmacist

## 2018-02-04 DIAGNOSIS — E785 Hyperlipidemia, unspecified: Secondary | ICD-10-CM | POA: Diagnosis not present

## 2018-02-04 NOTE — Patient Instructions (Addendum)
It was nice meeting you today.  We will start the paperwork to get Repatha approved by your insurance company. We will follow up once it is approved with what the copay is.   If you have any questions or concerns, please contact us at [phone].

## 2018-02-05 ENCOUNTER — Telehealth: Payer: Self-pay | Admitting: Pharmacist

## 2018-02-05 ENCOUNTER — Other Ambulatory Visit: Payer: Self-pay | Admitting: Urology

## 2018-02-05 LAB — LDL CHOLESTEROL, DIRECT: LDL Direct: 135 mg/dL — ABNORMAL HIGH (ref 0–99)

## 2018-02-05 MED ORDER — EVOLOCUMAB 140 MG/ML ~~LOC~~ SOAJ: 1.0000 "pen " | SUBCUTANEOUS | 11 refills | Status: DC

## 2018-02-05 NOTE — Telephone Encounter (Signed)
Prior authorization for Repatha has been submitted and approved immediately through 08/06/2018. Rx sent to local pharmacy to assess if copay is affordable; pt will not qualify for copay assistance.

## 2018-02-05 NOTE — Telephone Encounter (Signed)
Copay affordable at $100 per month. Pt is aware to call clinic to coordinate lab work after she has started injections.

## 2018-02-13 ENCOUNTER — Ambulatory Visit: Payer: Self-pay | Admitting: Surgery

## 2018-02-13 NOTE — H&P (Signed)
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 She has a known history of diverticulosis based on prior CT scans as well as colonoscopy reports. 3 mo ago, she is noticed some blood-tinged urine and since that time has had intermittent episodes of what she describes as hematuria as well as some sediment in her urine. She denies any abdominal pain, fever, chills, nausea, vomiting, or changes in her bowel habits. She states she has normal formed brown stools. 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 The CT report notes this was present dating back to 2012 and in 2016 demonstrated minimal change. It was 2.1 cm and 2016 and is now 3.3 cm  She is currently taking AZO for chronic mild dysuria that has been present for 12 weeks.  Her last scope 2016 and demonstrated 3 sessile polyps in the transverse, sigmoid and hepatic flexure. Mild diverticulosis in the left colon.   She underwent colonoscopy with Dr. Ardis Hughs 01/17/18 - 3 mm polyp, ascending colon and removed. Multiple small largemouth diverticula in the sigmoid and distal descending colon. This region was edematous, tortuous, and narrowed.  She is here today for follow-up. She was cleared by her PCP, Dr. Joylene Draft for surgery and he stated it was okay to posterior anticoagulation for 72 hours or longer if needed. He instructed her to stop her Eliquis 4 days prior to  surgery.  She also received clearance from her endocrinologist-Dr. Chalmers Cater at [facility]. For her adrenal insufficiency, perioperatively, he recommended hydrocortisone 50 mg IM/IV starting 30 minutes prior to surgery and every 8 hours until patient is clear to start taking oral medications. Following this, he recommended she start taking her home Cortef 20 mg by mouth twice a day  Today, she reports doing well. She denies any fever/chills/nausea/vomiting. She has been having normal bowel movements. She still has sediment in her urine.   PMH: HTN, HLD, Addison's (on hydrocortisone), Afib (on Eliquis), CAD s/p 3v CABG, chronic back pain with neurostimulator  PSH: Anterior lumbar fusion via left lower quadrant transverse abdominal incision done in [place] in the 1980s. Laparoscopic cholecystectomy. She denies any other abdominal operations including C-sections  FHx: Denies FHx of malignancy  Social: Denies use of tobacco/EtOH/drugs  ROS: A comprehensive 10 system review of systems was completed with the patient and pertinent findings as noted above.  The patient is a 77 year old female.   Medication History (Armen Ferguson, CMA; 01/31/2018 2:12 PM) rOPINIRole HCl (0.25MG  Tablet, Oral) Active. Iron Supplement (325 (65 Fe)MG Tablet, Oral) Active. Neomycin Sulfate (500MG  Tablet, 2 (two) Oral SEE NOTE, Taken starting 12/11/2017) Active. (TAKE TWO TABLETS AT 2 PM, 3 PM, AND 10 PM THE DAY PRIOR TO SURGERY) Flagyl (500MG  Tablet, 2 (two) Oral SEE NOTE, Taken starting 12/11/2017) Active. (Take at 2pm, 3pm, and 10pm the day prior to your colon operation) Methocarbamol (500MG  Tablet, Oral) Active. Vitamin D2 (Oral) Specific strength unknown - Active. Valsartan (Oral) Specific  strength unknown - Active. Probiotic (Oral) Specific strength unknown - Active. Potassium (Oral) Specific strength unknown - Active. Omeprazole (20MG  Capsule DR, Oral)  Active. Nitroglycerin (0.4MG  Tab Sublingual, Sublingual) Active. Magnesium (Oral) Specific strength unknown - Active. Levothyroxine Sodium (88MCG Tablet, Oral) Active. Hydrocortisone (0.25% Cream, External) Active. Furosemide (Oral) Specific strength unknown - Active. FLUoxetine HCl (Oral) Specific strength unknown - Active. Eliquis (Oral) Specific strength unknown - Active. Acetaminophen (Oral) Specific strength unknown - Active. Aspirin (81MG  Tablet DR, Oral) Active. clonazePAM (Oral) Specific strength unknown - Active. B Complex B-12 (Oral) Active. dilTIAZem HCl (Oral) Specific strength unknown - Active. Dofetilide (Oral) Specific strength unknown - Active. Medications Reconciled    Review of Systems Harrell Gave M. Lui Bellis MD; 01/31/2018 2:47 PM) General Present- Fatigue. Not Present- Appetite Loss, Chills, Fever, Night Sweats, Weight Gain and Weight Loss. Skin Not Present- Change in Wart/Mole, Dryness, Hives, Jaundice, New Lesions, Non-Healing Wounds, Rash and Ulcer. HEENT Present- Wears glasses/contact lenses. Not Present- Earache, Hearing Loss, Hoarseness, Nose Bleed, Oral Ulcers, Ringing in the Ears, Seasonal Allergies, Sinus Pain, Sore Throat, Visual Disturbances and Yellow Eyes. Respiratory Present- Snoring. Not Present- Bloody sputum, Chronic Cough, Difficulty Breathing and Wheezing. Breast Not Present- Breast Mass, Breast Pain, Nipple Discharge and Skin Changes. Cardiovascular Present- Leg Cramps and Shortness of Breath. Not Present- Chest Pain, Difficulty Breathing Lying Down, Palpitations, Rapid Heart Rate and Swelling of Extremities. Gastrointestinal Not Present- Abdominal Pain, Bloating, Bloody Stool, Change in Bowel Habits, Chronic diarrhea, Constipation, Difficulty Swallowing, Excessive gas, Gets full quickly at meals, Hemorrhoids, Indigestion, Nausea, Rectal Pain and Vomiting. Female Genitourinary Present- Frequency, Nocturia, Painful Urination and Urgency.  Not Present- Pelvic Pain. Musculoskeletal Present- Back Pain. Not Present- Joint Pain, Joint Stiffness, Muscle Pain, Muscle Weakness and Swelling of Extremities. Neurological Present- Numbness and Tingling. Not Present- Decreased Memory, Fainting, Headaches, Seizures, Tremor, Trouble walking and Weakness. Psychiatric Not Present- Anxiety, Bipolar, Change in Sleep Pattern, Depression, Fearful and Frequent crying. Endocrine Present- Excessive Hunger. Not Present- Cold Intolerance, Hair Changes, Heat Intolerance, Hot flashes and New Diabetes. Hematology Present- Blood Thinners and Easy Bruising. Not Present- Excessive bleeding, Gland problems, HIV and Persistent Infections.  Vitals (Armen Ferguson CMA; 01/31/2018 2:11 PM) 01/31/2018 2:10 PM Weight: 198.38 lb Height: 64.5in Body Surface Area: 1.96 m Body Mass Index: 33.52 kg/m  Temp.: 97.39F  Pulse: 122 (Regular)  P.OX: 97% (Room air) BP: 136/90 (Sitting, Left Arm, Standard)       Physical Exam Harrell Gave M. Sharis Keeran MD; 01/31/2018 2:48 PM) The physical exam findings are as follows: Note:Constitutional: No acute distress; conversant; no deformities Eyes: Moist conjunctiva; no lid lag; anicteric sclerae; pupils equal round and reactive to light Neck: Trachea midline; no palpable thyromegaly Lungs: Normal respiratory effort; no tactile fremitus CV: Irregular rate/rhythm - rate was in the upper 80s on my exam, previously 120 on arrival here; no palpable thrill; no pitting edema GI: Abdomen soft, nontender, nondistended; no palpable hepatosplenomegaly MSK: Normal gait; no clubbing/cyanosis Psychiatric: Appropriate affect; alert and oriented 3 Lymphatic: No palpable cervical or axillary lymphadenopathy    Assessment & Plan Harrell Gave M. 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 Smith-cleared her for stopping anticoagulation for 72 hours or longer if needed. -Endocrinology clearance obtained-Dr.  Balan recommended hydrocortisone 50 mg IV 30 minutes prior to surgery in every 8 hours until patient is taking by mouth. Following this, restart her Cortef 20 mg by mouth twice a day -The anatomy physiology of the GI tract was discussed at length with associated pictures as was the pathophysiology of diverticulitis and colovesical fistulas today. She has a 3.3 cm mass in the distal descending colon mesentery - a retroperitoneal mass, first noticed in 2012 and stable in 2016 but has grown by 1 cm in the last 3 years -We discussed options going forward including further observation versus surgery. Risks of further observation include continuing urinary issues and UTIs as well as potential worsening of her current condition. -Will plan robotic/laparoscopic versus open sigmoid colectomy and removal of the distal descending colon and its associated mesentery with the mass. This would be done with cystoscopy/stents by urology. We discussed the possibility of a stoma should be indicated at the time of surgery -The planned procedure, material risks (including, but not limited to, pain, bleeding, infection, scarring, need for blood transfusion, damage to surrounding structures- blood vessels/nerves/viscus/organs, damage to ureter, urine leak, leak from anastomosis, need for additional procedures, need for stoma which may be permanent, hernia, recurrence, pneumonia, heart attack, stroke, death) benefits and alternatives to surgery were discussed at length. I noted a good probability that the procedure would help improve her symptoms. The patient's questions were answered to her satisfaction, she voiced  understanding and elected to proceed with surgery. Additionally, we discussed typical postoperative expectations and the recovery process. -65 minutes was spent face to face going over all the above AFIB (I48.91) Impression: -Regarding her atrial fibrillation and intermittent RVR, she is touching base with her cardiologist and electrophysiologist this afternoon - she is calling their office for further instruction  Signed electronically by Ileana Roup, MD (01/31/2018 2:53 PM)

## 2018-02-21 ENCOUNTER — Ambulatory Visit: Payer: Medicare Other | Admitting: Internal Medicine

## 2018-02-21 ENCOUNTER — Telehealth: Payer: Self-pay

## 2018-02-21 ENCOUNTER — Encounter: Payer: Self-pay | Admitting: Internal Medicine

## 2018-02-21 VITALS — BP 132/74 | HR 73 | Ht 64.5 in | Wt 197.0 lb

## 2018-02-21 DIAGNOSIS — I10 Essential (primary) hypertension: Secondary | ICD-10-CM

## 2018-02-21 DIAGNOSIS — I2510 Atherosclerotic heart disease of native coronary artery without angina pectoris: Secondary | ICD-10-CM | POA: Diagnosis not present

## 2018-02-21 DIAGNOSIS — Z79899 Other long term (current) drug therapy: Secondary | ICD-10-CM | POA: Diagnosis not present

## 2018-02-21 DIAGNOSIS — I480 Paroxysmal atrial fibrillation: Secondary | ICD-10-CM

## 2018-02-21 DIAGNOSIS — Z951 Presence of aortocoronary bypass graft: Secondary | ICD-10-CM

## 2018-02-21 NOTE — Progress Notes (Signed)
HPI [name] returns today for followup of her atrial fib. She was placed on dofetilide several months ago and she has done well with no episodes of symptomatic atrial fib. She denies chest pain or sob. She does admit to being anxious. She has occaisional peripheral edema. 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 She denies chest pain or sob. Except for a single episode a couple of days ago. She has known CAD, s/p CABG 4 years ago. She has seen my partner Dr. Linard Millers and has been cleared for surgery. Allergies  Allergen Reactions  . Percocet [Oxycodone-Acetaminophen] Other (See Comments)    Hallucination  . Tramadol Other (See Comments)    Hallucinations.  . Sulfa Antibiotics Itching    Vaginal itching  . Sulfacetamide Sodium Itching    Vaginal itching     Current Outpatient Medications  Medication Sig Dispense Refill  . acetaminophen (TYLENOL) 500 MG tablet Take 500 mg by mouth daily as needed for headache.    [name] apixaban (ELIQUIS) 5 MG TABS tablet Take 5 mg by mouth 2 (two) times daily.    . Ascorbic Acid (VITAMIN C) 1000 MG tablet Take 1,000 mg by mouth daily.     [name] aspirin EC 81 MG tablet Take 81 mg by mouth every Monday, Wednesday, and Friday.    . clonazePAM (KLONOPIN) 0.5 MG tablet Take 0.25 mg by mouth 2 (two) times daily as needed for anxiety. Take half (1/2) to one (1) tablet (0.25 mg to 0.5 mg) by mouth twice daily as needed for anxiety.     . Cyanocobalamin 1000 MCG/ML KIT Inject 1 mL into the muscle every 30 (thirty) days. Take one (1) injection as directed every month.    . diltiazem (CARDIZEM CD) 180 MG 24 hr capsule Take 1 capsule (180 mg total) by mouth at bedtime.    . dofetilide (TIKOSYN) 125 MCG capsule TAKE ONE CAPSULE TWICE A DAY 180 capsule 3  . Evolocumab (REPATHA SURECLICK) 948 MG/ML SOAJ Inject 1 pen into the skin every 14 (fourteen) days. 2 pen 11  . FLUoxetine (PROZAC) 10 MG tablet Take 10 mg by mouth every morning.     .  furosemide (LASIX) 20 MG tablet Take 20 mg by mouth every 3 (three) days.     . hydrocortisone (CORTEF) 20 MG tablet Take 20 mg by mouth 2 (two) times daily.    . hyoscyamine (LEVSIN SL) 0.125 MG SL tablet Place 0.125 mg under the tongue daily as needed for cramping (IBS symptoms).    [name] levothyroxine (SYNTHROID, LEVOTHROID) 88 MCG tablet Take 1 tablet (88 mcg total) by mouth daily before breakfast. 30 tablet 0  . MAGNESIUM PO Take 1,000 mg by mouth daily.     [name] NITROSTAT 0.4 MG SL tablet ONE TABLET UNDER TONGUE AS NEEDED FOR CHEST PAIN AS DIRECTED (Patient taking differently: Place 0.4 mg under the tongue every 5 (five) minutes as needed for chest pain. ) 25 tablet 6  . omeprazole (PRILOSEC) 20 MG capsule Take 20 mg by mouth daily.     . polyethylene glycol-electrolytes (NULYTELY/GOLYTELY) 420 g solution Take 4,000 mLs by mouth as directed. 4000 mL 0  . potassium chloride (K-DUR) 10 MEQ tablet TAKE TWO TABLETS EVERY DAY (Patient taking differently: Take 20 mEq by mouth daily. ) 60 tablet 11  . Probiotic Product (PROBIOTIC FORMULA PO) Take 1 capsule by mouth at bedtime.     . pyridoxine (B-6) 200 MG tablet Take 200  mg by mouth daily.    [name] rOPINIRole (REQUIP) 0.25 MG tablet Take 0.25 mg by mouth at bedtime.     . valsartan (DIOVAN) 80 MG tablet Take half tablet (40 mg) by mouth each morning. Take additional half (40 mg) by mouth in the afternoon as needed for BP greater than 140/80. (Patient taking differently: Take 80 mg by mouth at bedtime. Take half tablet (40 mg) by mouth each morning. Take additional half (40 mg) by mouth in the afternoon as needed for BP greater than 140/80. ) 90 tablet 3  . Vitamin D, Ergocalciferol, (DRISDOL) 50000 UNITS CAPS Take 50,000 Units by mouth 2 (two) times a week. Takes on Tuesdays and Fridays     No current facility-administered medications for this visit.      Past Medical History:  Diagnosis Date  . Addison's disease ([place])   . Anxiety   . Arthritis     "everywhere"  . Atrial fibrillation (HCC)    Eliquis  . Chronic bronchitis ([facility])    "although I didn't get it this year" (07/23/2017)  . 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  last knee surgery dr. Elmyra Ricks gave large dose of hydrocortisal and avoided the hypotensive side effectas of addison's disease.  . Coronary artery disease   . Depression   . GERD (gastroesophageal reflux disease)   . Hyperlipemia   . Hypertension   . Hypothyroidism   . Lumbar radiculopathy 04/27/2014  . Migraine    "in my 20's" (07/23/2017)  . Recurrent upper respiratory infection (URI)     ROS:   All systems reviewed and negative except as noted in the HPI.   Past Surgical History:  Procedure Laterality Date  . ACHILLES TENDON SURGERY Left 07/16/2014   bone spur removed  . ANTERIOR LUMBAR FUSION  1980   L4-5  . BACK SURGERY    . CARDIAC CATHETERIZATION  2013  . CARDIAC CATHETERIZATION N/A 09/02/2014   Procedure: Left Heart Cath and Coronary Angiography;  Surgeon: Wellington Hampshire, MD;  Location: [facility];  Service: Cardiovascular;  Laterality: N/A;  . CARDIOVERSION N/A 10/22/2015   Procedure: CARDIOVERSION;  Surgeon: Sanda Klein, MD;  Location: [facility];  Service: Cardiovascular;  Laterality: N/A;  . CARDIOVERSION N/A 06/07/2017   Procedure: CARDIOVERSION;  Surgeon: Larey Dresser, MD;  Location: [facility]OSCOPY;  Service: Cardiovascular;  Laterality: N/A;  . CARDIOVERSION N/A 07/05/2017   Procedure: CARDIOVERSION;  Surgeon: Pixie Casino, MD;  Location: [facility];  Service: Cardiovascular;  Laterality: N/A;  . CARPAL TUNNEL RELEASE Right   . CATARACT EXTRACTION, BILATERAL Bilateral   . COLONOSCOPY WITH PROPOFOL N/A 01/17/2018   Procedure: COLONOSCOPY WITH PROPOFOL;  Surgeon: Milus Banister, MD;  Location: [facility];  Service: Endoscopy;  Laterality: N/A;  . CORONARY ARTERY BYPASS GRAFT  06/26/2011   Procedure: CORONARY ARTERY BYPASS  GRAFTING (CABG);  Surgeon: Melrose Nakayama, MD;  Location: [place];  Service: Open Heart Surgery;  Laterality: N/A;  times using Greater Saphenous Vein Graft harvested endoscopically from right leg  . DIAGNOSTIC LAPAROSCOPY    . DILATION AND CURETTAGE OF UTERUS    . JOINT REPLACEMENT    . KNEE ARTHROSCOPY Bilateral   . LAPAROSCOPIC CHOLECYSTECTOMY    . POLYPECTOMY  01/17/2018   Procedure: POLYPECTOMY;  Surgeon: Milus Banister, MD;  Location: [facility];  Service: Endoscopy;;  . REVISION TOTAL KNEE ARTHROPLASTY Right   . SPINAL CORD STIMULATOR IMPLANT  12/10/15  . TONSILLECTOMY    . TOTAL KNEE ARTHROPLASTY Bilateral      Family History  Problem Relation Age of Onset  . Heart attack Father   . Hypertension Mother   . Stroke Mother   . Hypertension Maternal Grandfather   . Colon cancer Neg Hx      Social History   Socioeconomic History  . Marital status: Widowed    Spouse name: Not on file  . Number of children: Not on file  . Years of education: Not on file  . Highest education level: Not on file  Occupational History  . Occupation: Retired  Scientific laboratory technician  . Financial resource strain: Not on file  . Food insecurity:    Worry: Not on file    Inability: Not on file  . Transportation needs:    Medical: Not on file    Non-medical: Not on file  Tobacco Use  . Smoking status: Passive Smoke Exposure - Never Smoker  . Smokeless tobacco: Never Used  . Tobacco comment: "husband passed in 2010; father passed 1978"  Substance and Sexual Activity  . Alcohol use: Yes    Alcohol/week: 0.0 standard drinks    Comment: glass of wine occasionally   . Drug use: No  . Sexual activity: Not Currently    Birth control/protection: Post-menopausal  Lifestyle  . Physical activity:    Days per week: Not on file    Minutes per session: Not on file  . Stress: Not on file  Relationships  . Social connections:    Talks on phone: Not on file    Gets together: Not on file    Attends  religious service: Not on file    Active member of club or organization: Not on file    Attends meetings of clubs or organizations: Not on file    Relationship status: Not on file  . Intimate partner violence:    Fear of current or ex partner: Not on file    Emotionally abused: Not on file    Physically abused: Not on file    Forced sexual activity: Not on file  Other Topics Concern  . Not on file  Social History Narrative  . Not on file     BP 132/74   Pulse 73   Ht 5' 4.5" (1.638 m)   Wt 197 lb (89.4 kg)   SpO2 99%   BMI 33.29 kg/m   Physical Exam:  Well appearing NAD HEENT: Unremarkable Neck:  No JVD, no thyromegally Lymphatics:  No adenopathy Back:  No CVA tenderness Lungs:  Clear with no wheezes HEART:  Regular rate rhythm, no murmurs, no rubs, no clicks Abd:  soft, positive bowel sounds, no organomegally, no rebound, no guarding Ext:  2 plus pulses, no edema, no cyanosis, no clubbing Skin:  No rashes no nodules Neuro:  CN II through XII intact, motor grossly intact  EKG - nsr   Assess/Plan: 1. PAF - she is maintaining NSR on dofetilide. I would not be surprised if she returns to atrial fib after surgery if she is unable to take her dofetilide.  2. CAD - she denies anginal symptoms. SHe had a fleeting bit of non-cardiac chest pain.  3. Preoperative eval - as per [name], she is low risk for cardiac complications from pending surgery.   [name].D.

## 2018-02-21 NOTE — Patient Instructions (Addendum)
Medication Instructions:  Your physician recommends that you continue on your current medications as directed. Please refer to the Current Medication list given to you today.  Labwork: Follow up labs were moved to April 18, 2017  Testing/Procedures: None ordered.  Follow-Up: Your physician wants you to follow-up in: one year with Dr. Lovena Le.   You will receive a reminder letter in the mail two months in advance. If you don't receive a letter, please call our office to schedule the follow-up appointment.  Any Other Special Instructions Will Be Listed Below (If Applicable).  If you need a refill on your cardiac medications before your next appointment, please call your pharmacy.

## 2018-02-21 NOTE — Telephone Encounter (Signed)
Called pt regarding repatha and pt stated that they started their first injection today and scheduled pt for labs 03/18/18

## 2018-02-21 NOTE — Telephone Encounter (Signed)
Pt will be rescheduled for after 4th dose. Spoke with Myrtie Hawk, RN who will assist with reschedule since pt in office seeing [facility].

## 2018-02-25 NOTE — Telephone Encounter (Signed)
Appt made for labs for repatha via Dr. Tanna Furry Nurse

## 2018-03-06 ENCOUNTER — Encounter: Payer: Self-pay | Admitting: Neurology

## 2018-03-06 ENCOUNTER — Institutional Professional Consult (permissible substitution): Payer: Medicare Other | Admitting: Neurology

## 2018-03-06 ENCOUNTER — Ambulatory Visit (INDEPENDENT_AMBULATORY_CARE_PROVIDER_SITE_OTHER): Payer: Medicare Other | Admitting: Neurology

## 2018-03-06 ENCOUNTER — Encounter

## 2018-03-06 ENCOUNTER — Other Ambulatory Visit: Payer: Self-pay

## 2018-03-06 VITALS — BP 140/82 | HR 70 | Resp 20 | Ht 64.5 in | Wt 197.0 lb

## 2018-03-06 DIAGNOSIS — R202 Paresthesia of skin: Secondary | ICD-10-CM

## 2018-03-06 NOTE — Progress Notes (Signed)
Reason for visit: Left leg numbness, leg pain  Referring physician: Dr. [name] is a 77 y.o. female  History of present illness:  [name] is a 77 year old white female with a history of lumbosacral spine surgery in the past.  The patient is followed by Dr. [name] for chronic low back pain, she has a spinal stimulator in place.  The patient had MRI of the lumbar spine in June 2019 secondary to increased low back pain and some numbness from the knee to the foot on the left leg.  Beginning 2 or 3 months ago, the patient began having discomfort that she describes as muscle cramps in both legs from the knees to the feet that would begin as soon as she would lie down in bed at night.  The patient was treated for restless leg syndrome which seemed to be quite effective, the patient has begun to exercise her legs, she is on 0.5 mg of Requip at night, the discomfort in the legs has been significantly reduced.  The patient continues to have some numbness of the left leg from the knee to the foot, primarily on the top of the foot.  The patient denies any weakness of the arms or legs, she denies any significant neck pain or pain down the arms.  She has no true sciatica type pain down the leg on either side.  She is to go for surgery to repair a colovesicular fistula within the next 2 months.  She does have some mild balance issues, she has had a recent fall, but she does not need to use a cane or a walker for ambulation.  She returns to this office for an evaluation.  In the past, she had EMG and nerve conduction study in 2016 that showed chronic bilateral L5 radiculopathy changes.  Past Medical History:  Diagnosis Date  . Addison's disease ([place])   . Anxiety   . Arthritis    "everywhere"  . Atrial fibrillation (HCC)    Eliquis  . Chronic bronchitis ([place])    "although I didn't get it this year" (07/23/2017)  . Complication of anesthesia    addison's disease causes hypotension after  any surgery .  last knee surgery dr. Elmyra Ricks gave large dose of hydrocortisal and avoided the hypotensive side effectas of addison's disease.  . Coronary artery disease   . Depression   . GERD (gastroesophageal reflux disease)   . Hyperlipemia   . Hypertension   . Hypothyroidism   . Lumbar radiculopathy 04/27/2014  . Migraine    "in my 20's" (07/23/2017)  . Recurrent upper respiratory infection (URI)     Past Surgical History:  Procedure Laterality Date  . ACHILLES TENDON SURGERY Left 07/16/2014   bone spur removed  . ANTERIOR LUMBAR FUSION  1980   L4-5  . BACK SURGERY    . CARDIAC CATHETERIZATION  2013  . CARDIAC CATHETERIZATION N/A 09/02/2014   Procedure: Left Heart Cath and Coronary Angiography;  Surgeon: Wellington Hampshire, MD;  Location: [facility];  Service: Cardiovascular;  Laterality: N/A;  . CARDIOVERSION N/A 10/22/2015   Procedure: CARDIOVERSION;  Surgeon: Sanda Klein, MD;  Location: [facility];  Service: Cardiovascular;  Laterality: N/A;  . CARDIOVERSION N/A 06/07/2017   Procedure: CARDIOVERSION;  Surgeon: Larey Dresser, MD;  Location: [facility] ENDOSCOPY;  Service: Cardiovascular;  Laterality: N/A;  . CARDIOVERSION N/A 07/05/2017   Procedure: CARDIOVERSION;  Surgeon: Pixie Casino, MD;  Location: [facility];  Service:  Cardiovascular;  Laterality: N/A;  . CARPAL TUNNEL RELEASE Right   . CATARACT EXTRACTION, BILATERAL Bilateral   . COLONOSCOPY WITH PROPOFOL N/A 01/17/2018   Procedure: COLONOSCOPY WITH PROPOFOL;  Surgeon: Milus Banister, MD;  Location: [facility];  Service: Endoscopy;  Laterality: N/A;  . CORONARY ARTERY BYPASS GRAFT  06/26/2011   Procedure: CORONARY ARTERY BYPASS GRAFTING (CABG);  Surgeon: Melrose Nakayama, MD;  Location: [place];  Service: Open Heart Surgery;  Laterality: N/A;  times using Greater Saphenous Vein Graft harvested endoscopically from right leg  . DIAGNOSTIC LAPAROSCOPY    . DILATION AND CURETTAGE OF UTERUS    . JOINT REPLACEMENT     . KNEE ARTHROSCOPY Bilateral   . LAPAROSCOPIC CHOLECYSTECTOMY    . POLYPECTOMY  01/17/2018   Procedure: POLYPECTOMY;  Surgeon: Milus Banister, MD;  Location: [facility];  Service: Endoscopy;;  . REVISION TOTAL KNEE ARTHROPLASTY Right   . SPINAL CORD STIMULATOR IMPLANT  12/10/15  . TONSILLECTOMY    . TOTAL KNEE ARTHROPLASTY Bilateral     Family History  Problem Relation Age of Onset  . Heart attack Father   . Hypertension Mother   . Stroke Mother   . Hypertension Maternal Grandfather   . Colon cancer Neg Hx     Social history:  reports that she is a non-smoker but has been exposed to tobacco smoke. She has been exposed to 0.00 packs per day for the past 0.00 years. She has never used smokeless tobacco. She reports that she drinks alcohol. She reports that she does not use drugs.  Medications:  Prior to Admission medications   Medication Sig Start Date End Date Taking? Authorizing Provider  acetaminophen (TYLENOL) 500 MG tablet Take 500 mg by mouth daily as needed for headache.   Yes [provider]  apixaban (ELIQUIS) 5 MG TABS tablet Take 5 mg by mouth 2 (two) times daily.   Yes [provider]  Ascorbic Acid (VITAMIN C) 1000 MG tablet Take 1,000 mg by mouth daily.    Yes [provider]  aspirin EC 81 MG tablet Take 81 mg by mouth every Monday, Wednesday, and Friday.   Yes [provider]  clonazePAM (KLONOPIN) 0.5 MG tablet Take 0.25 mg by mouth 2 (two) times daily as needed for anxiety. Take half (1/2) to one (1) tablet (0.25 mg to 0.5 mg) by mouth twice daily as needed for anxiety.    Yes [provider]  Cyanocobalamin 1000 MCG/ML KIT Inject 1 mL into the muscle every 30 (thirty) days. Take one (1) injection as directed every month.   Yes [provider]  diltiazem (CARDIZEM CD) 180 MG 24 hr capsule Take 1 capsule (180 mg total) by mouth at bedtime. 09/25/17  Yes Geradine Girt, DO  dofetilide (TIKOSYN) 125 MCG capsule TAKE  ONE CAPSULE TWICE A DAY 01/31/18  Yes Belva Crome, MD  Evolocumab (REPATHA SURECLICK) 650 MG/ML SOAJ Inject 1 pen into the skin every 14 (fourteen) days. 02/05/18  Yes Belva Crome, MD  FLUoxetine (PROZAC) 10 MG tablet Take 10 mg by mouth every morning.    Yes [provider]  furosemide (LASIX) 20 MG tablet Take 20 mg by mouth every 3 (three) days.    Yes [provider]  hydrocortisone (CORTEF) 20 MG tablet Take 20 mg by mouth 2 (two) times daily.   Yes [provider]  hyoscyamine (LEVSIN SL) 0.125 MG SL tablet Place 0.125 mg under the tongue daily as needed  for cramping (IBS symptoms).   Yes [provider]  levothyroxine (SYNTHROID, LEVOTHROID) 88 MCG tablet Take 1 tablet (88 mcg total) by mouth daily before breakfast. 03/25/16  Yes Elgergawy, Silver Huguenin, MD  MAGNESIUM PO Take 1,000 mg by mouth daily.    Yes [provider]  NITROSTAT 0.4 MG SL tablet ONE TABLET UNDER TONGUE AS NEEDED FOR CHEST PAIN AS DIRECTED Patient taking differently: Place 0.4 mg under the tongue every 5 (five) minutes as needed for chest pain.  11/01/17  Yes Belva Crome, MD  omeprazole (PRILOSEC) 20 MG capsule Take 20 mg by mouth daily.  11/19/13  Yes [provider]  polyethylene glycol-electrolytes (NULYTELY/GOLYTELY) 420 g solution Take 4,000 mLs by mouth as directed. 12/26/17  Yes Milus Banister, MD  potassium chloride (K-DUR) 10 MEQ tablet TAKE TWO TABLETS EVERY DAY Patient taking differently: Take 20 mEq by mouth daily.  11/01/17  Yes Belva Crome, MD  Probiotic Product (PROBIOTIC FORMULA PO) Take 1 capsule by mouth at bedtime.    Yes [provider]  pyridoxine (B-6) 200 MG tablet Take 200 mg by mouth daily.   Yes [provider]  rOPINIRole (REQUIP) 0.25 MG tablet Take 0.25 mg by mouth at bedtime.  12/31/17  Yes [provider]  valsartan (DIOVAN) 80 MG tablet Take half tablet (40 mg) by mouth each morning. Take additional half (40  mg) by mouth in the afternoon as needed for BP greater than 140/80. Patient taking differently: Take 80 mg by mouth at bedtime. Take half tablet (40 mg) by mouth each morning. Take additional half (40 mg) by mouth in the afternoon as needed for BP greater than 140/80.  12/04/17  Yes Belva Crome, MD  Vitamin D, Ergocalciferol, (DRISDOL) 50000 UNITS CAPS Take 50,000 Units by mouth 2 (two) times a week. Takes on Tuesdays and Fridays   Yes [provider]      Allergies  Allergen Reactions  . Percocet [Oxycodone-Acetaminophen] Other (See Comments)    Hallucination  . Tramadol Other (See Comments)    Hallucinations.  . Sulfa Antibiotics Itching    Vaginal itching  . Sulfacetamide Sodium Itching    Vaginal itching    ROS:  Out of a complete 14 system review of symptoms, the patient complains only of the following symptoms, and all other reviewed systems are negative.  Fatigue Easy bruising Muscle cramps Numbness Restless legs  Blood pressure 140/82, pulse 70, resp. rate 20, height 5' 4.5" (1.638 m), weight 197 lb (89.4 kg).  Physical Exam  General: The patient is alert and cooperative at the time of the examination.  The patient is moderately obese.  Eyes: Pupils are equal, round, and reactive to light. Discs are flat bilaterally.  Neck: The neck is supple, no carotid bruits are noted.  Respiratory: The respiratory examination is clear.  Cardiovascular: The cardiovascular examination reveals a regular rate and rhythm, no obvious murmurs or rubs are noted.  Skin: Extremities are with 1+ edema bilaterally at the ankles.  Neurologic Exam  Mental status: The patient is alert and oriented x 3 at the time of the examination. The patient has apparent normal recent and remote memory, with an apparently normal attention span and concentration ability.  Cranial nerves: Facial symmetry is present. There is good sensation of the face to pinprick and soft touch bilaterally. The  strength of the facial muscles and the muscles to head turning and shoulder shrug are normal bilaterally. Speech is well enunciated, no  aphasia or dysarthria is noted. Extraocular movements are full. Visual fields are full. The tongue is midline, and the patient has symmetric elevation of the soft palate. No obvious hearing deficits are noted.  Motor: The motor testing reveals 5 over 5 strength of all 4 extremities. Good symmetric motor tone is noted throughout.  Sensory: Sensory testing is intact to pinprick, soft touch, vibration sensation, and position sense on all 4 extremities, with exception of decreased position sense in both feet. No evidence of extinction is noted.  Coordination: Cerebellar testing reveals good finger-nose-finger and heel-to-shin bilaterally.  Gait and station: Gait is normal. Tandem gait is normal. Romberg is negative. No drift is seen.  The patient is able to walk on heels and the toes bilaterally.  Reflexes: Deep tendon reflexes are symmetric and normal bilaterally, with exception that there is a decrease in the ankle jerk reflexes bilaterally. Toes are downgoing bilaterally.   MRI lumbar 09/07/17:  IMPRESSION: 1. Disc and facet degeneration without progression from 2017. 2. Stable mild-to-moderate spinal stenosis at L2-3 and L3-4. 3. No compressive foraminal narrowing.  * MRI scan images were reviewed online. I agree with the written report.    Assessment/Plan:  1.  History of lumbosacral spine surgery  2.  Leg cramps, possible restless leg syndrome  3.  Left leg numbness and paresthesias  The patient will be set up for nerve conduction study of both legs and EMG on the left leg.  The patient has had good improvement of the leg discomfort, she has responded to treatment for restless leg syndrome.  She will return for the above study, the patient may have to delay the study until she has recovered from her upcoming surgery.  Jill Alexanders MD 03/06/2018  9:39 AM  [facility] [address] [place], [place] [address]  Phone [phone] Fax [phone]

## 2018-03-14 NOTE — Progress Notes (Signed)
CLEARANCE 01-10-18 DR. [facility] Epic  ekg 02-21-18 epic Echo 2017 epic lov dr. Crissie Sickles 02-21-18 epic

## 2018-03-14 NOTE — Patient Instructions (Signed)
[name]  03/14/2018   Your procedure is scheduled on: 03-20-18  Report to [facility] Main  Entrance   Report to admitting at      0630  AM    Call this number if you have problems the morning of surgery [phone]    FOLLOW BOWEL PREP PER MD AND A CLEAR LIQUID DIET THE DAY OF YOUR BOWEL PREP TO PREVENT DEHYDRATION    CLEAR LIQUID DIET   Foods Allowed                                                                     Foods Excluded  Coffee and tea, regular and decaf                             liquids that you cannot  Plain Jell-O in any flavor                                             see through such as: Fruit ices (not with fruit pulp)                                     milk, soups, orange juice  Iced Popsicles                                    All solid food Carbonated beverages, regular and diet                                    Cranberry, grape and apple juices Sports drinks like Gatorade Lightly seasoned clear broth or consume(fat free) Sugar, honey syrup  Sample Menu Breakfast                                Lunch                                     Supper Cranberry juice                    Beef broth                            Chicken broth Jell-O                                     Grape juice                           Apple juice Coffee or  tea                        Jell-O                                      Popsicle                                                Coffee or tea                        Coffee or tea  _____________________________________________________________________     Remember: DRINK 2 PRESURGERY ENSURE DRINKS THE NIGHT BEFORE SURGERY AT  1000 PM AND 1 PRESURGERY DRINK THE DAY OF THE PROCEDURE 3 HOURS PRIOR TO SCHEDULED SURGERY. NO SOLIDS AFTER MIDNIGHT THE DAY PRIOR TO THE SURGERY. NOTHING BY MOUTH EXCEPT CLEAR LIQUIDS UNTIL THREE HOURS PRIOR TO SCHEDULED SURGERY. PLEASE FINISH PRESURGERY ENSURE DRINK  PER SURGEON ORDER 3 HOURS PRIOR TO SCHEDULED SURGERY TIME WHICH NEEDS TO BE COMPLETED AT ____0530 am then nothing by mouth_____.   BRUSH YOUR TEETH MORNING OF SURGERY AND RINSE YOUR MOUTH OUT, NO CHEWING GUM CANDY OR MINTS.     Take these medicines the morning of surgery with A SIP OF WATER: omeprazole, levothyroxine, prozac, tikosyn                                You may not have any metal on your body including hair pins and              piercings  Do not wear jewelry, make-up, lotions, powders or perfumes, deodorant             Do not wear nail polish.  Do not shave  48 hours prior to surgery.             Do not bring valuables to the hospital. [place].  Contacts, dentures or bridgework may not be worn into surgery.  Leave suitcase in the car. After surgery it may be brought to your room.                  Please read over the following fact sheets you were given: _____________________________________________________________________             [facility] - Preparing for Surgery Before surgery, you can play an important role.  Because skin is not sterile, your skin needs to be as free of germs as possible.  You can reduce the number of germs on your skin by washing with CHG (chlorahexidine gluconate) soap before surgery.  CHG is an antiseptic cleaner which kills germs and bonds with the skin to continue killing germs even after washing. Please DO NOT use if you have an allergy to CHG or antibacterial soaps.  If your skin becomes reddened/irritated stop using the CHG and inform your nurse when you arrive at Short Stay. Do not shave (including legs and underarms) for at least 48 hours prior to the first CHG shower.  You may shave your face/neck. Please follow these instructions  carefully:  1.  Shower with CHG Soap the night before surgery and the  morning of Surgery.  2.  If you choose to wash your hair, wash your hair first as usual  with your  normal  shampoo.  3.  After you shampoo, rinse your hair and body thoroughly to remove the  shampoo.                           4.  Use CHG as you would any other liquid soap.  You can apply chg directly  to the skin and wash                       Gently with a scrungie or clean washcloth.  5.  Apply the CHG Soap to your body ONLY FROM THE NECK DOWN.   Do not use on face/ open                           Wound or open sores. Avoid contact with eyes, ears mouth and genitals (private parts).                       Wash face,  Genitals (private parts) with your normal soap.             6.  Wash thoroughly, paying special attention to the area where your surgery  will be performed.  7.  Thoroughly rinse your body with warm water from the neck down.  8.  DO NOT shower/wash with your normal soap after using and rinsing off  the CHG Soap.                9.  Pat yourself dry with a clean towel.            10.  Wear clean pajamas.            11.  Place clean sheets on your bed the night of your first shower and do not  sleep with pets. Day of Surgery : Do not apply any lotions/deodorants the morning of surgery.  Please wear clean clothes to the hospital/surgery center.  FAILURE TO FOLLOW THESE INSTRUCTIONS MAY RESULT IN THE CANCELLATION OF YOUR SURGERY PATIENT SIGNATURE_________________________________  NURSE SIGNATURE__________________________________  ________________________________________________________________________  WHAT IS A BLOOD TRANSFUSION? Blood Transfusion Information  A transfusion is the replacement of blood or some of its parts. Blood is made up of multiple cells which provide different functions.  Red blood cells carry oxygen and are used for blood loss replacement.  White blood cells fight against infection.  Platelets control bleeding.  Plasma helps clot blood.  Other blood products are available for specialized needs, such as hemophilia or other clotting  disorders. BEFORE THE TRANSFUSION  Who gives blood for transfusions?   Healthy volunteers who are fully evaluated to make sure their blood is safe. This is blood bank blood. Transfusion therapy is the safest it has ever been in the practice of medicine. Before blood is taken from a donor, a complete history is taken to make sure that person has no history of diseases nor engages in risky social behavior (examples are intravenous drug use or sexual activity with multiple partners). The donor's travel history is screened to minimize risk of transmitting infections, such as malaria. The donated blood is tested for signs of infectious diseases, such as HIV and hepatitis. The  blood is then tested to be sure it is compatible with you in order to minimize the chance of a transfusion reaction. If you or a relative donates blood, this is often done in anticipation of surgery and is not appropriate for emergency situations. It takes many days to process the donated blood. RISKS AND COMPLICATIONS Although transfusion therapy is very safe and saves many lives, the main dangers of transfusion include:   Getting an infectious disease.  Developing a transfusion reaction. This is an allergic reaction to something in the blood you were given. Every precaution is taken to prevent this. The decision to have a blood transfusion has been considered carefully by your caregiver before blood is given. Blood is not given unless the benefits outweigh the risks. AFTER THE TRANSFUSION  Right after receiving a blood transfusion, you will usually feel much better and more energetic. This is especially true if your red blood cells have gotten low (anemic). The transfusion raises the level of the red blood cells which carry oxygen, and this usually causes an energy increase.  The nurse administering the transfusion will monitor you carefully for complications. HOME CARE INSTRUCTIONS  No special instructions are needed after a  transfusion. You may find your energy is better. Speak with your caregiver about any limitations on activity for underlying diseases you may have. SEEK MEDICAL CARE IF:   Your condition is not improving after your transfusion.  You develop redness or irritation at the intravenous (IV) site. SEEK IMMEDIATE MEDICAL CARE IF:  Any of the following symptoms occur over the next 12 hours:  Shaking chills.  You have a temperature by mouth above 102 F (38.9 C), not controlled by medicine.  Chest, back, or muscle pain.  People around you feel you are not acting correctly or are confused.  Shortness of breath or difficulty breathing.  Dizziness and fainting.  You get a rash or develop hives.  You have a decrease in urine output.  Your urine turns a dark color or changes to pink, red, or brown. Any of the following symptoms occur over the next 10 days:  You have a temperature by mouth above 102 F (38.9 C), not controlled by medicine.  Shortness of breath.  Weakness after normal activity.  The white part of the eye turns yellow (jaundice).  You have a decrease in the amount of urine or are urinating less often.  Your urine turns a dark color or changes to pink, red, or brown. Document Released: 03/17/2000 Document Revised: 06/12/2011 Document Reviewed: 11/04/2007 Exit[employer] Patient Information 2014 [place].  _______________________________________________________________________  Incentive Spirometer  An incentive spirometer is a tool that can help keep your lungs clear and active. This tool measures how well you are filling your lungs with each breath. Taking long deep breaths may help reverse or decrease the chance of developing breathing (pulmonary) problems (especially infection) following:  A long period of time when you are unable to move or be active. BEFORE THE PROCEDURE   If the spirometer includes an indicator to show your best effort, your nurse or  respiratory therapist will set it to a desired goal.  If possible, sit up straight or lean slightly forward. Try not to slouch.  Hold the incentive spirometer in an upright position. INSTRUCTIONS FOR USE  1. Sit on the edge of your bed if possible, or sit up as far as you can in bed or on a chair. 2. Hold the incentive spirometer in an upright position. 3. Breathe  out normally. 4. Place the mouthpiece in your mouth and seal your lips tightly around it. 5. Breathe in slowly and as deeply as possible, raising the piston or the ball toward the top of the column. 6. Hold your breath for 3-5 seconds or for as long as possible. Allow the piston or ball to fall to the bottom of the column. 7. Remove the mouthpiece from your mouth and breathe out normally. 8. Rest for a few seconds and repeat Steps 1 through 7 at least 10 times every 1-2 hours when you are awake. Take your time and take a few normal breaths between deep breaths. 9. The spirometer may include an indicator to show your best effort. Use the indicator as a goal to work toward during each repetition. 10. After each set of 10 deep breaths, practice coughing to be sure your lungs are clear. If you have an incision (the cut made at the time of surgery), support your incision when coughing by placing a pillow or rolled up towels firmly against it. Once you are able to get out of bed, walk around indoors and cough well. You may stop using the incentive spirometer when instructed by your caregiver.  RISKS AND COMPLICATIONS  Take your time so you do not get dizzy or light-headed.  If you are in pain, you may need to take or ask for pain medication before doing incentive spirometry. It is harder to take a deep breath if you are having pain. AFTER USE  Rest and breathe slowly and easily.  It can be helpful to keep track of a log of your progress. Your caregiver can provide you with a simple table to help with this. If you are using the  spirometer at home, follow these instructions: [place] IF:   You are having difficultly using the spirometer.  You have trouble using the spirometer as often as instructed.  Your pain medication is not giving enough relief while using the spirometer.  You develop fever of 100.5 F (38.1 C) or higher. SEEK IMMEDIATE MEDICAL CARE IF:   You cough up bloody sputum that had not been present before.  You develop fever of 102 F (38.9 C) or greater.  You develop worsening pain at or near the incision site. MAKE SURE YOU:   Understand these instructions.  Will watch your condition.  Will get help right away if you are not doing well or get worse. Document Released: 07/31/2006 Document Revised: 06/12/2011 Document Reviewed: 10/01/2006 [facility] Patient Information 2014 [place], [place].   ________________________________________________________________________

## 2018-03-15 ENCOUNTER — Encounter (HOSPITAL_COMMUNITY): Payer: Self-pay

## 2018-03-15 ENCOUNTER — Encounter (HOSPITAL_COMMUNITY)
Admission: RE | Admit: 2018-03-15 | Discharge: 2018-03-15 | Disposition: A | Discharge status: Home / Self Care | Payer: Medicare Other | Source: Ambulatory Visit | Attending: Surgery | Admitting: Surgery

## 2018-03-15 ENCOUNTER — Other Ambulatory Visit: Payer: Self-pay

## 2018-03-15 DIAGNOSIS — Z01818 Encounter for other preprocedural examination: Secondary | ICD-10-CM | POA: Insufficient documentation

## 2018-03-15 DIAGNOSIS — N321 Vesicointestinal fistula: Secondary | ICD-10-CM | POA: Diagnosis not present

## 2018-03-15 DIAGNOSIS — Z951 Presence of aortocoronary bypass graft: Secondary | ICD-10-CM | POA: Diagnosis not present

## 2018-03-15 DIAGNOSIS — Z96653 Presence of artificial knee joint, bilateral: Secondary | ICD-10-CM | POA: Diagnosis not present

## 2018-03-15 DIAGNOSIS — Z7901 Long term (current) use of anticoagulants: Secondary | ICD-10-CM | POA: Diagnosis not present

## 2018-03-15 DIAGNOSIS — R1909 Other intra-abdominal and pelvic swelling, mass and lump: Secondary | ICD-10-CM | POA: Diagnosis not present

## 2018-03-15 DIAGNOSIS — I4891 Unspecified atrial fibrillation: Secondary | ICD-10-CM | POA: Diagnosis not present

## 2018-03-15 LAB — HEMOGLOBIN A1C
Hgb A1c MFr Bld: 5.8 % — ABNORMAL HIGH (ref 4.8–5.6)
Mean Plasma Glucose: 119.76 mg/dL

## 2018-03-15 LAB — CBC WITH DIFFERENTIAL/PLATELET
Abs Immature Granulocytes: 0.06 10*3/uL (ref 0.00–0.07)
Basophils Absolute: 0.1 10*3/uL (ref 0.0–0.1)
Basophils Relative: 0 %
Eosinophils Absolute: 0 10*3/uL (ref 0.0–0.5)
Eosinophils Relative: 0 %
HEMATOCRIT: 45.3 % (ref 36.0–46.0)
Hemoglobin: 14.6 g/dL (ref 12.0–15.0)
Immature Granulocytes: 1 %
Lymphocytes Relative: 23 %
Lymphs Abs: 2.8 10*3/uL (ref 0.7–4.0)
MCH: 30.2 pg (ref 26.0–34.0)
MCHC: 32.2 g/dL (ref 30.0–36.0)
MCV: 93.8 fL (ref 80.0–100.0)
MONOS PCT: 9 %
Monocytes Absolute: 1.1 10*3/uL — ABNORMAL HIGH (ref 0.1–1.0)
Neutro Abs: 8.3 10*3/uL — ABNORMAL HIGH (ref 1.7–7.7)
Neutrophils Relative %: 67 %
Platelets: 295 10*3/uL (ref 150–400)
RBC: 4.83 MIL/uL (ref 3.87–5.11)
RDW: 12.8 % (ref 11.5–15.5)
WBC: 12.4 10*3/uL — ABNORMAL HIGH (ref 4.0–10.5)
nRBC: 0 % (ref 0.0–0.2)

## 2018-03-15 LAB — COMPREHENSIVE METABOLIC PANEL
ALT: 21 U/L (ref 0–44)
AST: 21 U/L (ref 15–41)
Albumin: 4 g/dL (ref 3.5–5.0)
Alkaline Phosphatase: 58 U/L (ref 38–126)
Anion gap: 9 (ref 5–15)
BUN: 18 mg/dL (ref 8–23)
CO2: 27 mmol/L (ref 22–32)
Calcium: 8.9 mg/dL (ref 8.9–10.3)
Chloride: 106 mmol/L (ref 98–111)
Creatinine, Ser: 0.82 mg/dL (ref 0.44–1.00)
GFR calc Af Amer: >60 mL/min (ref >60)
GFR calc non Af Amer: >60 mL/min (ref >60)
Glucose, Bld: 94 mg/dL (ref 70–99)
Potassium: 3.9 mmol/L (ref 3.5–5.1)
Sodium: 142 mmol/L (ref 135–145)
Total Bilirubin: 1 mg/dL (ref 0.3–1.2)
Total Protein: 6.5 g/dL (ref 6.5–8.1)

## 2018-03-15 LAB — PROTIME-INR
INR: 1.27
Prothrombin Time: 15.8 seconds — ABNORMAL HIGH (ref 11.4–15.2)

## 2018-03-15 LAB — APTT: aPTT: 27 seconds (ref 24–36)

## 2018-03-15 NOTE — Consult Note (Signed)
[facility] Nurse requested for preoperative stoma site marking  Discussed surgical procedure and stoma creation with patient and family.  Explained role of the [facility] nurse team.  Provided the patient with educational booklet and provided samples of pouching options.  Answered patient and family questions.   Examined patient sitting, and standing in order to place the marking in the patient's visual field, away from any creases or abdominal contour issues and within the rectus muscle.  Attempted to mark below the patient's belt line.   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  Also marked in the RUQ 10 cm to the right of the umbilicus and 2 cm above the umbilicus.  Patient states she has had extensive surgery on her left side and the area will likely have a lot of scar tissue and adhesions.  Patient's abdomen cleansed with CHG wipes at site markings, allowed to air dry prior to marking.Covered mark with thin film transparent dressing to preserve mark until date of surgery.   [facility] Nurse team will follow up with patient after surgery for continue ostomy care and teaching.  Val Riles, RN, MSN, CWOCN, CNS-BC, pager [phone]

## 2018-03-15 NOTE — Patient Instructions (Addendum)
[name]  03/15/2018   Your procedure is scheduled on: 03-20-18  Report to [facility] Main  Entrance  Report to admitting at    0630 AM    Call this number if you have problems the morning of surgery [phone]              Follow bowel prep per M.D orders Follow a clear liquid diet the day of your surgery to prevent dehydration               Remember: DRINK 2 PRESURGERY ENSURE DRINKS THE NIGHT BEFORE SURGERY AT  1000 PM AND 1 PRESURGERY DRINK THE DAY OF THE PROCEDURE 3 HOURS PRIOR TO SCHEDULED SURGERY. NO SOLIDS AFTER MIDNIGHT THE DAY PRIOR TO THE SURGERY. NOTHING BY MOUTH EXCEPT CLEAR LIQUIDS UNTIL THREE HOURS PRIOR TO SCHEDULED SURGERY. PLEASE FINISH PRESURGERY ENSURE DRINK PER SURGEON ORDER 3 HOURS PRIOR TO SCHEDULED SURGERY TIME WHICH NEEDS TO BE COMPLETED AT _0530 am then nothing by mouth________.   BRUSH YOUR TEETH MORNING OF SURGERY AND RINSE YOUR MOUTH OUT, NO CHEWING GUM CANDY OR MINTS.     Take these medicines the morning of surgery with A SIP OF WATER: omeprazole, levothyroxine, prozac, tokosyn,                                 You may not have any metal on your body including hair pins and              piercings  Do not wear jewelry, make-up, lotions, powders or perfumes, deodorant             Do not wear nail polish.  Do not shave  48 hours prior to surgery.            Do not bring valuables to the hospital. [place].  Contacts, dentures or bridgework may not be worn into surgery.  Leave suitcase in the car. After surgery it may be brought to your room.                  Please read over the following fact sheets you were given: _____________________________________________________________________           [facility] - Preparing for Surgery Before surgery, you can play an important role.  Because skin is not sterile, your skin needs to be as free of germs as possible.  You can  reduce the number of germs on your skin by washing with CHG (chlorahexidine gluconate) soap before surgery.  CHG is an antiseptic cleaner which kills germs and bonds with the skin to continue killing germs even after washing. Please DO NOT use if you have an allergy to CHG or antibacterial soaps.  If your skin becomes reddened/irritated stop using the CHG and inform your nurse when you arrive at Short Stay. Do not shave (including legs and underarms) for at least 48 hours prior to the first CHG shower.  You may shave your face/neck. Please follow these instructions carefully:  1.  Shower with CHG Soap the night before surgery and the  morning of Surgery.  2.  If you choose to wash your hair, wash your hair first as usual with your  normal  shampoo.  3.  After you shampoo, rinse your hair and body thoroughly to remove the  shampoo.                           4.  Use CHG as you would any other liquid soap.  You can apply chg directly  to the skin and wash                       Gently with a scrungie or clean washcloth.  5.  Apply the CHG Soap to your body ONLY FROM THE NECK DOWN.   Do not use on face/ open                           Wound or open sores. Avoid contact with eyes, ears mouth and genitals (private parts).                       Wash face,  Genitals (private parts) with your normal soap.             6.  Wash thoroughly, paying special attention to the area where your surgery  will be performed.  7.  Thoroughly rinse your body with warm water from the neck down.  8.  DO NOT shower/wash with your normal soap after using and rinsing off  the CHG Soap.                9.  Pat yourself dry with a clean towel.            10.  Wear clean pajamas.            11.  Place clean sheets on your bed the night of your first shower and do not  sleep with pets. Day of Surgery : Do not apply any lotions/deodorants the morning of surgery.  Please wear clean clothes to the hospital/surgery center.  FAILURE TO  FOLLOW THESE INSTRUCTIONS MAY RESULT IN THE CANCELLATION OF YOUR SURGERY PATIENT SIGNATURE_________________________________  NURSE SIGNATURE__________________________________  ________________________________________________________________________  WHAT IS A BLOOD TRANSFUSION? Blood Transfusion Information  A transfusion is the replacement of blood or some of its parts. Blood is made up of multiple cells which provide different functions.  Red blood cells carry oxygen and are used for blood loss replacement.  White blood cells fight against infection.  Platelets control bleeding.  Plasma helps clot blood.  Other blood products are available for specialized needs, such as hemophilia or other clotting disorders. BEFORE THE TRANSFUSION  Who gives blood for transfusions?   Healthy volunteers who are fully evaluated to make sure their blood is safe. This is blood bank blood. Transfusion therapy is the safest it has ever been in the practice of medicine. Before blood is taken from a donor, a complete history is taken to make sure that person has no history of diseases nor engages in risky social behavior (examples are intravenous drug use or sexual activity with multiple partners). The donor's travel history is screened to minimize risk of transmitting infections, such as malaria. The donated blood is tested for signs of infectious diseases, such as HIV and hepatitis. The blood is then tested to be sure it is compatible with you in order to minimize the chance of a transfusion reaction. If you or a relative donates blood, this is often done in anticipation of surgery and is not appropriate for  emergency situations. It takes many days to process the donated blood. RISKS AND COMPLICATIONS Although transfusion therapy is very safe and saves many lives, the main dangers of transfusion include:   Getting an infectious disease.  Developing a transfusion reaction. This is an allergic reaction to  something in the blood you were given. Every precaution is taken to prevent this. The decision to have a blood transfusion has been considered carefully by your caregiver before blood is given. Blood is not given unless the benefits outweigh the risks. AFTER THE TRANSFUSION  Right after receiving a blood transfusion, you will usually feel much better and more energetic. This is especially true if your red blood cells have gotten low (anemic). The transfusion raises the level of the red blood cells which carry oxygen, and this usually causes an energy increase.  The nurse administering the transfusion will monitor you carefully for complications. HOME CARE INSTRUCTIONS  No special instructions are needed after a transfusion. You may find your energy is better. Speak with your caregiver about any limitations on activity for underlying diseases you may have. SEEK MEDICAL CARE IF:   Your condition is not improving after your transfusion.  You develop redness or irritation at the intravenous (IV) site. SEEK IMMEDIATE MEDICAL CARE IF:  Any of the following symptoms occur over the next 12 hours:  Shaking chills.  You have a temperature by mouth above 102 F (38.9 C), not controlled by medicine.  Chest, back, or muscle pain.  People around you feel you are not acting correctly or are confused.  Shortness of breath or difficulty breathing.  Dizziness and fainting.  You get a rash or develop hives.  You have a decrease in urine output.  Your urine turns a dark color or changes to pink, red, or brown. Any of the following symptoms occur over the next 10 days:  You have a temperature by mouth above 102 F (38.9 C), not controlled by medicine.  Shortness of breath.  Weakness after normal activity.  The white part of the eye turns yellow (jaundice).  You have a decrease in the amount of urine or are urinating less often.  Your urine turns a dark color or changes to pink, red, or  brown. Document Released: 03/17/2000 Document Revised: 06/12/2011 Document Reviewed: 11/04/2007 ExitCare Patient Information 2014 [place].  _______________________________________________________________________  Incentive Spirometer  An incentive spirometer is a tool that can help keep your lungs clear and active. This tool measures how well you are filling your lungs with each breath. Taking long deep breaths may help reverse or decrease the chance of developing breathing (pulmonary) problems (especially infection) following:  A long period of time when you are unable to move or be active. BEFORE THE PROCEDURE   If the spirometer includes an indicator to show your best effort, your nurse or respiratory therapist will set it to a desired goal.  If possible, sit up straight or lean slightly forward. Try not to slouch.  Hold the incentive spirometer in an upright position. INSTRUCTIONS FOR USE  1. Sit on the edge of your bed if possible, or sit up as far as you can in bed or on a chair. 2. Hold the incentive spirometer in an upright position. 3. Breathe out normally. 4. Place the mouthpiece in your mouth and seal your lips tightly around it. 5. Breathe in slowly and as deeply as possible, raising the piston or the ball toward the top of the column. 6. Hold your breath for  3-5 seconds or for as long as possible. Allow the piston or ball to fall to the bottom of the column. 7. Remove the mouthpiece from your mouth and breathe out normally. 8. Rest for a few seconds and repeat Steps 1 through 7 at least 10 times every 1-2 hours when you are awake. Take your time and take a few normal breaths between deep breaths. 9. The spirometer may include an indicator to show your best effort. Use the indicator as a goal to work toward during each repetition. 10. After each set of 10 deep breaths, practice coughing to be sure your lungs are clear. If you have an incision (the cut made at the time  of surgery), support your incision when coughing by placing a pillow or rolled up towels firmly against it. Once you are able to get out of bed, walk around indoors and cough well. You may stop using the incentive spirometer when instructed by your caregiver.  RISKS AND COMPLICATIONS  Take your time so you do not get dizzy or light-headed.  If you are in pain, you may need to take or ask for pain medication before doing incentive spirometry. It is harder to take a deep breath if you are having pain. AFTER USE  Rest and breathe slowly and easily.  It can be helpful to keep track of a log of your progress. Your caregiver can provide you with a simple table to help with this. If you are using the spirometer at home, follow these instructions: [place] IF:   You are having difficultly using the spirometer.  You have trouble using the spirometer as often as instructed.  Your pain medication is not giving enough relief while using the spirometer.  You develop fever of 100.5 F (38.1 C) or higher. SEEK IMMEDIATE MEDICAL CARE IF:   You cough up bloody sputum that had not been present before.  You develop fever of 102 F (38.9 C) or greater.  You develop worsening pain at or near the incision site. MAKE SURE YOU:   Understand these instructions.  Will watch your condition.  Will get help right away if you are not doing well or get worse. Document Released: 07/31/2006 Document Revised: 06/12/2011 Document Reviewed: 10/01/2006 [facility] Patient Information 2014 [place], [place].   ________________________________________________________________________

## 2018-03-18 ENCOUNTER — Other Ambulatory Visit: Payer: Medicare Other

## 2018-03-19 MED ORDER — BUPIVACAINE LIPOSOME 1.3 % IJ SUSP
20.0000 mL | INTRAMUSCULAR | Status: DC
  Filled 2018-03-19: qty 20

## 2018-03-20 ENCOUNTER — Other Ambulatory Visit: Payer: Self-pay

## 2018-03-20 ENCOUNTER — Inpatient Hospital Stay (HOSPITAL_COMMUNITY): Payer: Medicare Other

## 2018-03-20 ENCOUNTER — Inpatient Hospital Stay (HOSPITAL_COMMUNITY)
Admission: RE | Admit: 2018-03-20 | Discharge: 2018-04-02 | DRG: 660 | Disposition: A | Discharge status: Skilled Nursing Facility | Payer: Medicare Other | Attending: Surgery | Admitting: Surgery

## 2018-03-20 ENCOUNTER — Inpatient Hospital Stay (HOSPITAL_COMMUNITY): Payer: Medicare Other | Admitting: Anesthesiology

## 2018-03-20 ENCOUNTER — Encounter (HOSPITAL_COMMUNITY)
Admission: RE | Disposition: A | Discharge status: Skilled Nursing Facility | Payer: Self-pay | Source: Home / Self Care | Attending: Surgery

## 2018-03-20 ENCOUNTER — Encounter (HOSPITAL_COMMUNITY): Payer: Self-pay | Admitting: Anesthesiology

## 2018-03-20 DIAGNOSIS — Z7901 Long term (current) use of anticoagulants: Secondary | ICD-10-CM | POA: Diagnosis not present

## 2018-03-20 DIAGNOSIS — Z981 Arthrodesis status: Secondary | ICD-10-CM

## 2018-03-20 DIAGNOSIS — Z7952 Long term (current) use of systemic steroids: Secondary | ICD-10-CM

## 2018-03-20 DIAGNOSIS — D1779 Benign lipomatous neoplasm of other sites: Secondary | ICD-10-CM | POA: Diagnosis present

## 2018-03-20 DIAGNOSIS — N3289 Other specified disorders of bladder: Secondary | ICD-10-CM | POA: Diagnosis not present

## 2018-03-20 DIAGNOSIS — K6389 Other specified diseases of intestine: Secondary | ICD-10-CM | POA: Diagnosis not present

## 2018-03-20 DIAGNOSIS — X58XXXA Exposure to other specified factors, initial encounter: Secondary | ICD-10-CM | POA: Diagnosis present

## 2018-03-20 DIAGNOSIS — Z823 Family history of stroke: Secondary | ICD-10-CM

## 2018-03-20 DIAGNOSIS — K219 Gastro-esophageal reflux disease without esophagitis: Secondary | ICD-10-CM | POA: Diagnosis present

## 2018-03-20 DIAGNOSIS — K5732 Diverticulitis of large intestine without perforation or abscess without bleeding: Secondary | ICD-10-CM | POA: Diagnosis present

## 2018-03-20 DIAGNOSIS — Z882 Allergy status to sulfonamides status: Secondary | ICD-10-CM | POA: Diagnosis not present

## 2018-03-20 DIAGNOSIS — Z951 Presence of aortocoronary bypass graft: Secondary | ICD-10-CM | POA: Diagnosis not present

## 2018-03-20 DIAGNOSIS — E271 Primary adrenocortical insufficiency: Secondary | ICD-10-CM | POA: Diagnosis present

## 2018-03-20 DIAGNOSIS — Z79890 Hormone replacement therapy (postmenopausal): Secondary | ICD-10-CM

## 2018-03-20 DIAGNOSIS — Z885 Allergy status to narcotic agent status: Secondary | ICD-10-CM | POA: Diagnosis not present

## 2018-03-20 DIAGNOSIS — G4730 Sleep apnea, unspecified: Secondary | ICD-10-CM | POA: Diagnosis present

## 2018-03-20 DIAGNOSIS — R1310 Dysphagia, unspecified: Secondary | ICD-10-CM | POA: Diagnosis not present

## 2018-03-20 DIAGNOSIS — N321 Vesicointestinal fistula: Secondary | ICD-10-CM

## 2018-03-20 DIAGNOSIS — E785 Hyperlipidemia, unspecified: Secondary | ICD-10-CM | POA: Diagnosis present

## 2018-03-20 DIAGNOSIS — G8929 Other chronic pain: Secondary | ICD-10-CM | POA: Diagnosis present

## 2018-03-20 DIAGNOSIS — Z6834 Body mass index (BMI) 34.0-34.9, adult: Secondary | ICD-10-CM | POA: Diagnosis not present

## 2018-03-20 DIAGNOSIS — Z9841 Cataract extraction status, right eye: Secondary | ICD-10-CM

## 2018-03-20 DIAGNOSIS — K632 Fistula of intestine: Secondary | ICD-10-CM | POA: Diagnosis present

## 2018-03-20 DIAGNOSIS — B370 Candidal stomatitis: Secondary | ICD-10-CM | POA: Diagnosis not present

## 2018-03-20 DIAGNOSIS — E039 Hypothyroidism, unspecified: Secondary | ICD-10-CM | POA: Diagnosis present

## 2018-03-20 DIAGNOSIS — I482 Chronic atrial fibrillation, unspecified: Secondary | ICD-10-CM | POA: Diagnosis present

## 2018-03-20 DIAGNOSIS — Z7982 Long term (current) use of aspirin: Secondary | ICD-10-CM

## 2018-03-20 DIAGNOSIS — M5416 Radiculopathy, lumbar region: Secondary | ICD-10-CM | POA: Diagnosis present

## 2018-03-20 DIAGNOSIS — F329 Major depressive disorder, single episode, unspecified: Secondary | ICD-10-CM | POA: Diagnosis present

## 2018-03-20 DIAGNOSIS — Z961 Presence of intraocular lens: Secondary | ICD-10-CM | POA: Diagnosis present

## 2018-03-20 DIAGNOSIS — Z96653 Presence of artificial knee joint, bilateral: Secondary | ICD-10-CM | POA: Diagnosis present

## 2018-03-20 DIAGNOSIS — I2510 Atherosclerotic heart disease of native coronary artery without angina pectoris: Secondary | ICD-10-CM | POA: Diagnosis present

## 2018-03-20 DIAGNOSIS — Z79899 Other long term (current) drug therapy: Secondary | ICD-10-CM

## 2018-03-20 DIAGNOSIS — Z86010 Personal history of colonic polyps: Secondary | ICD-10-CM

## 2018-03-20 DIAGNOSIS — Z9049 Acquired absence of other specified parts of digestive tract: Secondary | ICD-10-CM

## 2018-03-20 DIAGNOSIS — T380X5A Adverse effect of glucocorticoids and synthetic analogues, initial encounter: Secondary | ICD-10-CM | POA: Diagnosis present

## 2018-03-20 DIAGNOSIS — Z9682 Presence of neurostimulator: Secondary | ICD-10-CM

## 2018-03-20 DIAGNOSIS — R300 Dysuria: Secondary | ICD-10-CM | POA: Diagnosis present

## 2018-03-20 DIAGNOSIS — F419 Anxiety disorder, unspecified: Secondary | ICD-10-CM | POA: Diagnosis present

## 2018-03-20 DIAGNOSIS — M1990 Unspecified osteoarthritis, unspecified site: Secondary | ICD-10-CM | POA: Diagnosis present

## 2018-03-20 DIAGNOSIS — Z751 Person awaiting admission to adequate facility elsewhere: Secondary | ICD-10-CM

## 2018-03-20 DIAGNOSIS — I10 Essential (primary) hypertension: Secondary | ICD-10-CM | POA: Diagnosis present

## 2018-03-20 DIAGNOSIS — Z8249 Family history of ischemic heart disease and other diseases of the circulatory system: Secondary | ICD-10-CM

## 2018-03-20 DIAGNOSIS — E669 Obesity, unspecified: Secondary | ICD-10-CM | POA: Diagnosis present

## 2018-03-20 DIAGNOSIS — R21 Rash and other nonspecific skin eruption: Secondary | ICD-10-CM | POA: Diagnosis not present

## 2018-03-20 DIAGNOSIS — Z9842 Cataract extraction status, left eye: Secondary | ICD-10-CM

## 2018-03-20 HISTORY — DX: Urinary tract infection, site not specified: N39.0

## 2018-03-20 HISTORY — PX: CYSTOSCOPY W/ URETERAL STENT PLACEMENT: SHX1429

## 2018-03-20 HISTORY — PX: TAKE DOWN OF INTESTINAL FISTULA: SHX6107

## 2018-03-20 HISTORY — PX: COLON RESECTION: SHX5231

## 2018-03-20 LAB — GLUCOSE, CAPILLARY
Glucose-Capillary: 181 mg/dL — ABNORMAL HIGH (ref 70–99)
Glucose-Capillary: 155 mg/dL — ABNORMAL HIGH (ref 70–99)

## 2018-03-20 LAB — TYPE AND SCREEN
ABO/RH(D): B POS
Antibody Screen: NEGATIVE

## 2018-03-20 LAB — MRSA PCR SCREENING: MRSA by PCR: NEGATIVE

## 2018-03-20 SURGERY — COLECTOMY, LEFT, LAPAROSCOPIC
Anesthesia: General | Site: Ureter

## 2018-03-20 MED ORDER — SUGAMMADEX SODIUM 200 MG/2ML IV SOLN
INTRAVENOUS | Status: DC | PRN
  Administered 2018-03-20: 200 mg via INTRAVENOUS

## 2018-03-20 MED ORDER — ROCURONIUM BROMIDE 10 MG/ML (PF) SYRINGE
PREFILLED_SYRINGE | INTRAVENOUS | Status: DC | PRN
  Administered 2018-03-20: 50 mg via INTRAVENOUS
  Administered 2018-03-20: 20 mg via INTRAVENOUS
  Administered 2018-03-20: 10 mg via INTRAVENOUS
  Administered 2018-03-20: 20 mg via INTRAVENOUS
  Administered 2018-03-20: 10 mg via INTRAVENOUS

## 2018-03-20 MED ORDER — ONDANSETRON HCL 4 MG/2ML IJ SOLN: 4.0000 mg | Freq: Once | INTRAMUSCULAR | Status: DC | PRN

## 2018-03-20 MED ORDER — METRONIDAZOLE 500 MG PO TABS: 1000.0000 mg | ORAL_TABLET | ORAL | Status: DC

## 2018-03-20 MED ORDER — LIDOCAINE 2% (20 MG/ML) 5 ML SYRINGE
INTRAMUSCULAR | Status: DC | PRN
  Administered 2018-03-20: 1 mg/kg/h via INTRAVENOUS

## 2018-03-20 MED ORDER — FENTANYL CITRATE (PF) 100 MCG/2ML IJ SOLN
INTRAMUSCULAR | Status: AC
  Filled 2018-03-20: qty 2

## 2018-03-20 MED ORDER — 0.9 % SODIUM CHLORIDE (POUR BTL) OPTIME
TOPICAL | Status: DC | PRN
  Administered 2018-03-20: 2000 mL

## 2018-03-20 MED ORDER — ROPINIROLE HCL 1 MG PO TABS
0.5000 mg | ORAL_TABLET | Freq: Every day | ORAL | Status: DC
  Administered 2018-03-21 – 2018-04-01 (×12): 0.5 mg via ORAL
  Filled 2018-03-20 (×12): qty 1

## 2018-03-20 MED ORDER — IRBESARTAN 75 MG PO TABS
37.5000 mg | ORAL_TABLET | Freq: Every day | ORAL | Status: DC
  Administered 2018-03-21 – 2018-04-02 (×13): 37.5 mg via ORAL
  Filled 2018-03-20 (×14): qty 0.5

## 2018-03-20 MED ORDER — IBUPROFEN 200 MG PO TABS
600.0000 mg | ORAL_TABLET | Freq: Four times a day (QID) | ORAL | Status: DC | PRN
  Administered 2018-03-20 – 2018-03-21 (×3): 600 mg via ORAL
  Filled 2018-03-20 (×3): qty 3

## 2018-03-20 MED ORDER — HYDRALAZINE HCL 20 MG/ML IJ SOLN: 10.0000 mg | INTRAMUSCULAR | Status: DC | PRN

## 2018-03-20 MED ORDER — INSULIN ASPART 100 UNIT/ML ~~LOC~~ SOLN
0.0000 [IU] | Freq: Every day | SUBCUTANEOUS | Status: DC
  Administered 2018-03-30: 5 [IU] via SUBCUTANEOUS

## 2018-03-20 MED ORDER — TRAMADOL HCL 50 MG PO TABS
50.0000 mg | ORAL_TABLET | Freq: Four times a day (QID) | ORAL | Status: DC | PRN
  Administered 2018-03-20 – 2018-03-24 (×4): 50 mg via ORAL
  Filled 2018-03-20 (×4): qty 1

## 2018-03-20 MED ORDER — BUPIVACAINE LIPOSOME 1.3 % IJ SUSP
INTRAMUSCULAR | Status: DC | PRN
  Administered 2018-03-20: 20 mL

## 2018-03-20 MED ORDER — SUGAMMADEX SODIUM 200 MG/2ML IV SOLN
INTRAVENOUS | Status: AC
  Filled 2018-03-20: qty 2

## 2018-03-20 MED ORDER — CHLORHEXIDINE GLUCONATE CLOTH 2 % EX PADS: 6.0000 | MEDICATED_PAD | Freq: Once | CUTANEOUS | Status: DC

## 2018-03-20 MED ORDER — BUPIVACAINE-EPINEPHRINE (PF) 0.25% -1:200000 IJ SOLN
INTRAMUSCULAR | Status: AC
  Filled 2018-03-20: qty 30

## 2018-03-20 MED ORDER — DIPHENHYDRAMINE HCL 50 MG/ML IJ SOLN: 12.5000 mg | Freq: Four times a day (QID) | INTRAMUSCULAR | Status: DC | PRN

## 2018-03-20 MED ORDER — NEOMYCIN SULFATE 500 MG PO TABS: 1000.0000 mg | ORAL_TABLET | ORAL | Status: DC

## 2018-03-20 MED ORDER — BUPIVACAINE-EPINEPHRINE (PF) 0.25% -1:200000 IJ SOLN
INTRAMUSCULAR | Status: DC | PRN
  Administered 2018-03-20: 30 mL

## 2018-03-20 MED ORDER — ACETAMINOPHEN 500 MG PO TABS
1000.0000 mg | ORAL_TABLET | Freq: Four times a day (QID) | ORAL | Status: AC
  Administered 2018-03-20 – 2018-03-27 (×23): 1000 mg via ORAL
  Filled 2018-03-20 (×27): qty 2

## 2018-03-20 MED ORDER — PANTOPRAZOLE SODIUM 40 MG PO TBEC
40.0000 mg | DELAYED_RELEASE_TABLET | Freq: Every day | ORAL | Status: DC
  Administered 2018-03-21 – 2018-04-02 (×13): 40 mg via ORAL
  Filled 2018-03-20 (×13): qty 1

## 2018-03-20 MED ORDER — LIDOCAINE HCL 2 % IJ SOLN
INTRAMUSCULAR | Status: AC
  Filled 2018-03-20: qty 20

## 2018-03-20 MED ORDER — ALVIMOPAN 12 MG PO CAPS
12.0000 mg | ORAL_CAPSULE | ORAL | Status: AC
  Administered 2018-03-20: 12 mg via ORAL
  Filled 2018-03-20: qty 1

## 2018-03-20 MED ORDER — LACTATED RINGERS IV SOLN
INTRAVENOUS | Status: DC
  Administered 2018-03-20: 08:00:00 via INTRAVENOUS

## 2018-03-20 MED ORDER — ROCURONIUM BROMIDE 10 MG/ML (PF) SYRINGE
PREFILLED_SYRINGE | INTRAVENOUS | Status: AC
  Filled 2018-03-20: qty 10

## 2018-03-20 MED ORDER — CLONAZEPAM 0.25 MG PO TBDP: 0.2500 mg | ORAL_TABLET | Freq: Two times a day (BID) | ORAL | Status: DC | PRN

## 2018-03-20 MED ORDER — DIPHENHYDRAMINE HCL 12.5 MG/5ML PO ELIX: 12.5000 mg | ORAL_SOLUTION | Freq: Four times a day (QID) | ORAL | Status: DC | PRN

## 2018-03-20 MED ORDER — SODIUM CHLORIDE 0.9 % IV SOLN
INTRAVENOUS | Status: DC | PRN
  Administered 2018-03-20: 25 ug/min via INTRAVENOUS

## 2018-03-20 MED ORDER — PROPOFOL 10 MG/ML IV BOLUS
INTRAVENOUS | Status: AC
  Filled 2018-03-20: qty 40

## 2018-03-20 MED ORDER — PHENYLEPHRINE 40 MCG/ML (10ML) SYRINGE FOR IV PUSH (FOR BLOOD PRESSURE SUPPORT)
PREFILLED_SYRINGE | INTRAVENOUS | Status: DC | PRN
  Administered 2018-03-20 (×7): 80 ug via INTRAVENOUS

## 2018-03-20 MED ORDER — ONDANSETRON HCL 4 MG/2ML IJ SOLN
INTRAMUSCULAR | Status: AC
  Filled 2018-03-20: qty 2

## 2018-03-20 MED ORDER — FENTANYL CITRATE (PF) 100 MCG/2ML IJ SOLN
INTRAMUSCULAR | Status: DC | PRN
  Administered 2018-03-20: 100 ug via INTRAVENOUS
  Administered 2018-03-20: 50 ug via INTRAVENOUS

## 2018-03-20 MED ORDER — HEPARIN SODIUM (PORCINE) 5000 UNIT/ML IJ SOLN
5000.0000 [IU] | Freq: Once | INTRAMUSCULAR | Status: AC
  Administered 2018-03-20: 5000 [IU] via SUBCUTANEOUS
  Filled 2018-03-20: qty 1

## 2018-03-20 MED ORDER — GLYCOPYRROLATE PF 0.2 MG/ML IJ SOSY
PREFILLED_SYRINGE | INTRAMUSCULAR | Status: DC | PRN
  Administered 2018-03-20: .2 mg via INTRAVENOUS

## 2018-03-20 MED ORDER — ACETAMINOPHEN 500 MG PO TABS
1000.0000 mg | ORAL_TABLET | ORAL | Status: AC
  Administered 2018-03-20: 1000 mg via ORAL
  Filled 2018-03-20: qty 2

## 2018-03-20 MED ORDER — PHENYLEPHRINE 40 MCG/ML (10ML) SYRINGE FOR IV PUSH (FOR BLOOD PRESSURE SUPPORT)
PREFILLED_SYRINGE | INTRAVENOUS | Status: AC
  Filled 2018-03-20: qty 20

## 2018-03-20 MED ORDER — LACTATED RINGERS IR SOLN
Status: DC | PRN
  Administered 2018-03-20: 1000 mL

## 2018-03-20 MED ORDER — LIDOCAINE 2% (20 MG/ML) 5 ML SYRINGE
INTRAMUSCULAR | Status: AC
  Filled 2018-03-20: qty 10

## 2018-03-20 MED ORDER — KETAMINE HCL 10 MG/ML IJ SOLN
INTRAMUSCULAR | Status: AC
  Filled 2018-03-20: qty 1

## 2018-03-20 MED ORDER — HYDROMORPHONE HCL 1 MG/ML IJ SOLN
0.5000 mg | INTRAMUSCULAR | Status: DC | PRN
  Administered 2018-03-20 – 2018-03-21 (×4): 0.5 mg via INTRAVENOUS
  Filled 2018-03-20: qty 1
  Filled 2018-03-20: qty 0.5
  Filled 2018-03-20 (×4): qty 1

## 2018-03-20 MED ORDER — INSULIN ASPART 100 UNIT/ML ~~LOC~~ SOLN
0.0000 [IU] | Freq: Three times a day (TID) | SUBCUTANEOUS | Status: DC
  Administered 2018-03-20 – 2018-03-21 (×2): 3 [IU] via SUBCUTANEOUS
  Administered 2018-03-21: 2 [IU] via SUBCUTANEOUS
  Administered 2018-03-21: 3 [IU] via SUBCUTANEOUS
  Administered 2018-03-22: 2 [IU] via SUBCUTANEOUS
  Administered 2018-03-22: 3 [IU] via SUBCUTANEOUS
  Administered 2018-03-22 – 2018-03-23 (×2): 5 [IU] via SUBCUTANEOUS
  Administered 2018-03-23: 2 [IU] via SUBCUTANEOUS
  Administered 2018-03-24: 3 [IU] via SUBCUTANEOUS
  Administered 2018-03-25 – 2018-03-27 (×2): 2 [IU] via SUBCUTANEOUS
  Administered 2018-03-28 – 2018-03-29 (×2): 3 [IU] via SUBCUTANEOUS
  Administered 2018-03-29 – 2018-03-30 (×3): 2 [IU] via SUBCUTANEOUS
  Administered 2018-03-31 – 2018-04-01 (×2): 3 [IU] via SUBCUTANEOUS
  Administered 2018-04-01 – 2018-04-02 (×2): 2 [IU] via SUBCUTANEOUS

## 2018-03-20 MED ORDER — SODIUM CHLORIDE 0.9 % IV SOLN
2.0000 g | INTRAVENOUS | Status: AC
  Administered 2018-03-20: 2 g via INTRAVENOUS
  Filled 2018-03-20: qty 2

## 2018-03-20 MED ORDER — ONDANSETRON HCL 4 MG/2ML IJ SOLN
4.0000 mg | Freq: Four times a day (QID) | INTRAMUSCULAR | Status: DC | PRN
  Filled 2018-03-20: qty 2

## 2018-03-20 MED ORDER — STERILE WATER FOR IRRIGATION IR SOLN
Status: DC | PRN
  Administered 2018-03-20: 1000 mL via INTRAVESICAL

## 2018-03-20 MED ORDER — DOFETILIDE 125 MCG PO CAPS
125.0000 ug | ORAL_CAPSULE | Freq: Two times a day (BID) | ORAL | Status: DC
  Administered 2018-03-20 – 2018-04-02 (×25): 125 ug via ORAL
  Filled 2018-03-20 (×27): qty 1

## 2018-03-20 MED ORDER — POLYETHYLENE GLYCOL 3350 17 GM/SCOOP PO POWD: 1.0000 | Freq: Once | ORAL | Status: DC

## 2018-03-20 MED ORDER — DILTIAZEM HCL ER COATED BEADS 180 MG PO CP24
180.0000 mg | ORAL_CAPSULE | Freq: Every day | ORAL | Status: DC
  Administered 2018-03-20 – 2018-04-01 (×13): 180 mg via ORAL
  Filled 2018-03-20 (×13): qty 1

## 2018-03-20 MED ORDER — ONDANSETRON HCL 4 MG PO TABS: 4.0000 mg | ORAL_TABLET | Freq: Four times a day (QID) | ORAL | Status: DC | PRN

## 2018-03-20 MED ORDER — GABAPENTIN 300 MG PO CAPS
300.0000 mg | ORAL_CAPSULE | ORAL | Status: AC
  Administered 2018-03-20: 300 mg via ORAL
  Filled 2018-03-20: qty 1

## 2018-03-20 MED ORDER — LEVOTHYROXINE SODIUM 88 MCG PO TABS
88.0000 ug | ORAL_TABLET | Freq: Every day | ORAL | Status: DC
  Administered 2018-03-21 – 2018-04-02 (×13): 88 ug via ORAL
  Filled 2018-03-20 (×14): qty 1

## 2018-03-20 MED ORDER — LIDOCAINE 2% (20 MG/ML) 5 ML SYRINGE
INTRAMUSCULAR | Status: DC | PRN
  Administered 2018-03-20: 80 mg via INTRAVENOUS

## 2018-03-20 MED ORDER — HYOSCYAMINE SULFATE 0.125 MG SL SUBL
0.1250 mg | SUBLINGUAL_TABLET | Freq: Every day | SUBLINGUAL | Status: DC | PRN
  Administered 2018-03-20 – 2018-03-24 (×2): 0.125 mg via SUBLINGUAL
  Filled 2018-03-20 (×3): qty 1

## 2018-03-20 MED ORDER — ALUM & MAG HYDROXIDE-SIMETH 200-200-20 MG/5ML PO SUSP: 30.0000 mL | Freq: Four times a day (QID) | ORAL | Status: DC | PRN

## 2018-03-20 MED ORDER — ORAL CARE MOUTH RINSE
15.0000 mL | Freq: Two times a day (BID) | OROMUCOSAL | Status: DC
  Administered 2018-03-21 – 2018-04-02 (×13): 15 mL via OROMUCOSAL

## 2018-03-20 MED ORDER — LACTATED RINGERS IV SOLN
INTRAVENOUS | Status: DC
  Administered 2018-03-20 (×2): via INTRAVENOUS

## 2018-03-20 MED ORDER — ALVIMOPAN 12 MG PO CAPS
12.0000 mg | ORAL_CAPSULE | Freq: Two times a day (BID) | ORAL | Status: DC
  Administered 2018-03-21 – 2018-03-22 (×3): 12 mg via ORAL
  Filled 2018-03-20 (×3): qty 1

## 2018-03-20 MED ORDER — METHYLENE BLUE 0.5 % INJ SOLN
INTRAVENOUS | Status: AC
  Filled 2018-03-20: qty 10

## 2018-03-20 MED ORDER — ENSURE SURGERY PO LIQD
237.0000 mL | Freq: Two times a day (BID) | ORAL | Status: DC
  Administered 2018-03-21 – 2018-04-02 (×15): 237 mL via ORAL
  Filled 2018-03-20 (×26): qty 237

## 2018-03-20 MED ORDER — HEPARIN SODIUM (PORCINE) 5000 UNIT/ML IJ SOLN
5000.0000 [IU] | Freq: Three times a day (TID) | INTRAMUSCULAR | Status: DC
  Administered 2018-03-20 – 2018-03-22 (×5): 5000 [IU] via SUBCUTANEOUS
  Filled 2018-03-20 (×5): qty 1

## 2018-03-20 MED ORDER — HYDROCORTISONE NA SUCCINATE PF 100 MG IJ SOLR
INTRAMUSCULAR | Status: AC
  Filled 2018-03-20: qty 2

## 2018-03-20 MED ORDER — HYDROCORTISONE 20 MG PO TABS
20.0000 mg | ORAL_TABLET | Freq: Two times a day (BID) | ORAL | Status: DC
  Administered 2018-03-20 – 2018-04-02 (×26): 20 mg via ORAL
  Filled 2018-03-20 (×26): qty 1

## 2018-03-20 MED ORDER — LACTATED RINGERS IV SOLN
INTRAVENOUS | Status: DC
  Administered 2018-03-20 (×2): via INTRAVENOUS

## 2018-03-20 MED ORDER — PROPOFOL 10 MG/ML IV BOLUS
INTRAVENOUS | Status: DC | PRN
  Administered 2018-03-20: 140 mg via INTRAVENOUS

## 2018-03-20 MED ORDER — FENTANYL CITRATE (PF) 100 MCG/2ML IJ SOLN
25.0000 ug | INTRAMUSCULAR | Status: DC | PRN
  Administered 2018-03-20: 50 ug via INTRAVENOUS

## 2018-03-20 MED ORDER — ONDANSETRON HCL 4 MG/2ML IJ SOLN
INTRAMUSCULAR | Status: DC | PRN
  Administered 2018-03-20 (×2): 4 mg via INTRAVENOUS

## 2018-03-20 MED ORDER — FLUOXETINE HCL 10 MG PO CAPS
10.0000 mg | ORAL_CAPSULE | Freq: Every morning | ORAL | Status: DC
  Administered 2018-03-21 – 2018-04-02 (×13): 10 mg via ORAL
  Filled 2018-03-20 (×13): qty 1

## 2018-03-20 MED ORDER — HYDROCORTISONE NA SUCCINATE PF 100 MG IJ SOLR
INTRAMUSCULAR | Status: DC | PRN
  Administered 2018-03-20: 100 mg via INTRAVENOUS

## 2018-03-20 MED ORDER — DEXAMETHASONE SODIUM PHOSPHATE 10 MG/ML IJ SOLN
INTRAMUSCULAR | Status: DC | PRN
  Administered 2018-03-20: 10 mg via INTRAVENOUS

## 2018-03-20 MED ORDER — KETAMINE HCL 10 MG/ML IJ SOLN
INTRAMUSCULAR | Status: DC | PRN
  Administered 2018-03-20 (×2): 10 mg via INTRAVENOUS

## 2018-03-20 SURGICAL SUPPLY — 85 items
ADAPTER GOLDBERG URETERAL (ADAPTER) ×5 IMPLANT
ADH SKN CLS APL DERMABOND .7 (GAUZE/BANDAGES/DRESSINGS) ×3
ADPR CATH 15X14FR FL DRN BG (ADAPTER) ×3
APPLIER CLIP 5 13 M/L LIGAMAX5 (MISCELLANEOUS)
APPLIER CLIP ROT 10 11.4 M/L (STAPLE)
APR CLP MED LRG 11.4X10 (STAPLE)
APR CLP MED LRG 5 ANG JAW (MISCELLANEOUS)
BAG URO CATCHER STRL LF (MISCELLANEOUS) ×5 IMPLANT
BLADE EXTENDED COATED 6.5IN (ELECTRODE) IMPLANT
CABLE HIGH FREQUENCY MONO STRZ (ELECTRODE) ×5 IMPLANT
CATH INTERMIT  6FR 70CM (CATHETERS) ×10 IMPLANT
CATH MUSHROOM 28FR (CATHETERS) IMPLANT
CATH MUSHROOM 30FR (CATHETERS) IMPLANT
CELLS DAT CNTRL 66122 CELL SVR (MISCELLANEOUS) IMPLANT
CLIP APPLIE 5 13 M/L LIGAMAX5 (MISCELLANEOUS) IMPLANT
CLIP APPLIE ROT 10 11.4 M/L (STAPLE) IMPLANT
CLOTH BEACON ORANGE TIMEOUT ST (SAFETY) ×5 IMPLANT
COVER WAND RF STERILE (DRAPES) IMPLANT
DECANTER SPIKE VIAL GLASS SM (MISCELLANEOUS) ×5 IMPLANT
DERMABOND ADVANCED (GAUZE/BANDAGES/DRESSINGS) ×2
DERMABOND ADVANCED .7 DNX12 (GAUZE/BANDAGES/DRESSINGS) ×3 IMPLANT
DISSECTOR BLUNT TIP ENDO 5MM (MISCELLANEOUS) IMPLANT
DRAIN CHANNEL 19F RND (DRAIN) IMPLANT
DRAPE SURG IRRIG POUCH 19X23 (DRAPES) ×5 IMPLANT
DRSG OPSITE POSTOP 4X10 (GAUZE/BANDAGES/DRESSINGS) IMPLANT
DRSG OPSITE POSTOP 4X6 (GAUZE/BANDAGES/DRESSINGS) IMPLANT
DRSG OPSITE POSTOP 4X8 (GAUZE/BANDAGES/DRESSINGS) IMPLANT
ELECT REM PT RETURN 15FT ADLT (MISCELLANEOUS) ×5 IMPLANT
EVACUATOR SILICONE 100CC (DRAIN) IMPLANT
GAUZE SPONGE 4X4 12PLY STRL (GAUZE/BANDAGES/DRESSINGS) IMPLANT
GLOVE BIO SURGEON STRL SZ7.5 (GLOVE) ×15 IMPLANT
GLOVE INDICATOR 8.0 STRL GRN (GLOVE) ×10 IMPLANT
GOWN STRL REUS W/TWL LRG LVL3 (GOWN DISPOSABLE) ×10 IMPLANT
GOWN STRL REUS W/TWL XL LVL3 (GOWN DISPOSABLE) ×40 IMPLANT
GUIDEWIRE STR DUAL SENSOR (WIRE) ×5 IMPLANT
HOLDER FOLEY CATH W/STRAP (MISCELLANEOUS) ×5 IMPLANT
LIGASURE IMPACT 36 18CM CVD LR (INSTRUMENTS) IMPLANT
MANIFOLD NEPTUNE II (INSTRUMENTS) ×5 IMPLANT
NEEDLE INSUFFLATION 14GA 120MM (NEEDLE) IMPLANT
PACK COLON (CUSTOM PROCEDURE TRAY) ×5 IMPLANT
PACK CYSTO (CUSTOM PROCEDURE TRAY) ×5 IMPLANT
PAD POSITIONING PINK XL (MISCELLANEOUS) ×5 IMPLANT
PLUG CATH AND CAP STER (CATHETERS) ×5 IMPLANT
PORT LAP GEL ALEXIS MED 5-9CM (MISCELLANEOUS) ×5 IMPLANT
RELOAD STAPLER GREEN 60MM (STAPLE) ×6 IMPLANT
RTRCTR WOUND ALEXIS 18CM MED (MISCELLANEOUS)
SCISSORS LAP 5X35 DISP (ENDOMECHANICALS) ×5 IMPLANT
SEALER TISSUE G2 STRG ARTC 35C (ENDOMECHANICALS) ×5 IMPLANT
SET IRRIG TUBING LAPAROSCOPIC (IRRIGATION / IRRIGATOR) ×5 IMPLANT
SLEEVE ADV FIXATION 5X100MM (TROCAR) ×10 IMPLANT
SPONGE DRAIN TRACH 4X4 STRL 2S (GAUZE/BANDAGES/DRESSINGS) IMPLANT
STAPLER RELOAD GREEN 60MM (STAPLE) ×10
STAPLER VISISTAT 35W (STAPLE) IMPLANT
SUT ETHILON 3 0 PS 1 (SUTURE) IMPLANT
SUT PDS AB 1 CTX 36 (SUTURE) IMPLANT
SUT PDS AB 1 TP1 54 (SUTURE) IMPLANT
SUT PDS AB 1 TP1 96 (SUTURE) IMPLANT
SUT PROLENE 2 0 KS (SUTURE) ×5 IMPLANT
SUT PROLENE 2 0 SH DA (SUTURE) ×5 IMPLANT
SUT SILK 2 0 (SUTURE) ×5
SUT SILK 2 0 SH CR/8 (SUTURE) ×5 IMPLANT
SUT SILK 2-0 18XBRD TIE 12 (SUTURE) ×3 IMPLANT
SUT SILK 3 0 (SUTURE) ×5
SUT SILK 3 0 SH CR/8 (SUTURE) ×5 IMPLANT
SUT SILK 3-0 18XBRD TIE 12 (SUTURE) ×3 IMPLANT
SUT VIC AB 2-0 SH 27 (SUTURE)
SUT VIC AB 2-0 SH 27X BRD (SUTURE) IMPLANT
SUT VIC AB 3-0 SH 18 (SUTURE) ×10 IMPLANT
SUT VIC AB 3-0 SH 27 (SUTURE)
SUT VIC AB 3-0 SH 27X BRD (SUTURE) IMPLANT
SUT VICRYL 2 0 18  UND BR (SUTURE) ×2
SUT VICRYL 2 0 18 UND BR (SUTURE) ×3 IMPLANT
SYS LAPSCP GELPORT 120MM (MISCELLANEOUS)
SYSTEM LAPSCP GELPORT 120MM (MISCELLANEOUS) IMPLANT
TOWEL OR 17X26 10 PK STRL BLUE (TOWEL DISPOSABLE) IMPLANT
TOWEL OR NON WOVEN STRL DISP B (DISPOSABLE) ×5 IMPLANT
TRAY FOLEY MTR SLVR 14FR STAT (SET/KITS/TRAYS/PACK) ×5 IMPLANT
TRAY FOLEY MTR SLVR 16FR STAT (SET/KITS/TRAYS/PACK) IMPLANT
TRAY IRRIG W/60CC SYR STRL (SET/KITS/TRAYS/PACK) IMPLANT
TROCAR ADV FIXATION 12X100MM (TROCAR) ×5 IMPLANT
TROCAR ADV FIXATION 5X100MM (TROCAR) ×5 IMPLANT
TROCAR XCEL BLUNT TIP 100MML (ENDOMECHANICALS) IMPLANT
TUBING CONNECTING 10 (TUBING) ×8 IMPLANT
TUBING CONNECTING 10' (TUBING) ×2
TUBING INSUF HEATED (TUBING) ×5 IMPLANT

## 2018-03-20 NOTE — Consult Note (Signed)
Medical Consultation   [name]  OYD:[id number]  DOB: March 14, 1941  DOA: 03/20/2018  PCP: Crist Infante, MD   Outpatient Specialists:  Crist Infante, MD as PCP - General (Internal Medicine) Belva Crome, MD as PCP - Cardiology (Cardiology) Dalton-Bethea, Fabio Asa, MD as Referring Physician (Physical Medicine and Rehabilitation) Michael Boston, MD as Consulting Physician (General Surgery) Milus Banister, MD as Attending Physician (Gastroenterology) Nickie Retort, MD as Consulting Physician (Urology) Jacelyn Pi, MD as Consulting Physician (Endocrinology) Suella Broad, MD as Consulting Physician (Physical Medicine and Rehabilitation)  Requesting physician: [facility]- Nadeen Landau, MD  Reason for consultation: Medical Management    History of Present Illness: [name] is an 77 y.o. 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  She also had a cystoscopy and bilateral stents placed by urology while she was under general anesthesia.  Medicine has been consulted for medical management of her multiple comorbidities.  Review of Systems:  Review of  Systems  Constitutional: Negative for chills and fever.  HENT: Negative.   Eyes: Negative.   Respiratory: Positive for wheezing.   Cardiovascular: Negative.   Gastrointestinal: Positive for abdominal pain and nausea.  Genitourinary: Negative.   Musculoskeletal: Negative.   Skin: Negative for itching.  Neurological: Positive for weakness.  Endo/Heme/Allergies: Negative.   Psychiatric/Behavioral: Negative.    As per HPI otherwise 10 point review of systems negative.   Past Medical History: Past Medical History:  Diagnosis Date  . Addison's disease ([place])   . Anxiety   . Arthritis    "everywhere"  . Atrial fibrillation ([facility])    Eliquis  . Chronic bronchitis ([place])    "although I didn't get it this year" (07/23/2017)  . 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  last knee surgery dr. Elmyra Ricks gave large dose of hydrocortisal and avoided the hypotensive side effectas of addison's disease.  . Coronary artery disease   . Depression   . GERD (gastroesophageal reflux disease)   . Hyperlipemia   . Hypertension   . Hypothyroidism   . Lumbar radiculopathy 04/27/2014  . Migraine    "in my 20's" (07/23/2017)  . Recurrent upper respiratory infection (URI)    Past Surgical History: Past Surgical History:  Procedure Laterality Date  . ACHILLES TENDON SURGERY Left 07/16/2014   bone spur removed  . ANTERIOR LUMBAR FUSION  1980   L4-5  . BACK SURGERY    . CARDIAC CATHETERIZATION  2013  . CARDIAC CATHETERIZATION N/A 09/02/2014   Procedure: Left Heart Cath and Coronary Angiography;  Surgeon: Wellington Hampshire, MD;  Location: [facility];  Service: Cardiovascular;  Laterality: N/A;  . CARDIOVERSION N/A 10/22/2015   Procedure: CARDIOVERSION;  Surgeon: Sanda Klein, MD;  Location: [facility] ENDOSCOPY;  Service: Cardiovascular;  Laterality: N/A;  . CARDIOVERSION N/A 06/07/2017   Procedure: CARDIOVERSION;  Surgeon: Larey Dresser, MD;  Location: [facility]  ENDOSCOPY;  Service: Cardiovascular;  Laterality: N/A;  . CARDIOVERSION N/A 07/05/2017   Procedure: CARDIOVERSION;  Surgeon: Pixie Casino, MD;  Location: [facility];  Service: Cardiovascular;  Laterality: N/A;  . CARPAL TUNNEL RELEASE Right   . CATARACT EXTRACTION, BILATERAL Bilateral   . COLON SURGERY     laparascopic vs. open sigmoidectomy with cystoscopy  Dr. Dema Severin and Dr. Gloriann Loan 03-20-18  . COLONOSCOPY WITH PROPOFOL N/A 01/17/2018   Procedure: COLONOSCOPY WITH PROPOFOL;  Surgeon: Milus Banister, MD;  Location: [facility] ENDOSCOPY;  Service: Endoscopy;  Laterality: N/A;  . CORONARY ARTERY BYPASS GRAFT  06/26/2011   Procedure: CORONARY ARTERY BYPASS GRAFTING (CABG);  Surgeon: Melrose Nakayama, MD;  Location: [place];  Service: Open Heart Surgery;  Laterality: N/A;  times using Greater Saphenous Vein Graft harvested endoscopically from right leg  . DIAGNOSTIC LAPAROSCOPY    . DILATION AND CURETTAGE OF UTERUS    . JOINT REPLACEMENT    . KNEE ARTHROSCOPY Bilateral   . LAPAROSCOPIC CHOLECYSTECTOMY    . POLYPECTOMY  01/17/2018   Procedure: POLYPECTOMY;  Surgeon: Milus Banister, MD;  Location: [facility];  Service: Endoscopy;;  . REVISION TOTAL KNEE ARTHROPLASTY Right   . SPINAL CORD STIMULATOR IMPLANT  12/10/15  . TONSILLECTOMY    . TOTAL KNEE ARTHROPLASTY Bilateral    Allergies: Allergies  Allergen Reactions  . Percocet [Oxycodone-Acetaminophen] Other (See Comments)    Hallucination, pt is not allergic to tylenol(acetaminophen)  . Tramadol Other (See Comments)    Hallucinations.  . Sulfa Antibiotics Itching    Vaginal itching  . Sulfacetamide Sodium Itching    Vaginal itching   Social History:  reports that she is a non-smoker but has been exposed to tobacco smoke. She has been exposed to 0.00 packs per day for the past 0.00 years. She has never used smokeless tobacco. She reports current alcohol use. She reports that she does not use drugs.  Family History: Family History    Problem Relation Age of Onset  . Heart attack Father   . Hypertension Mother   . Stroke Mother   . Hypertension Maternal Grandfather   . Colon cancer Neg Hx    Physical Exam: Vitals:   03/20/18 0645 03/20/18 0651 03/20/18 1232  BP:  128/75 (!) 112/59  Pulse:  72 (!) 52  Resp:  16 17  Temp:  98.3 F (36.8 C) 97.8 F (36.6 C)  TempSrc:  Oral   SpO2:  97% 99%  Weight: 89.4 kg    Height: 5' 4.5" (1.638 m)      Constitutional: Drowsy and somnolent but easily arousable, not in any acute distress. Eyes:  anicteric sclera,  ENMT: external ears and nose appear normal,             Lips appears normal, oropharynx mucosa, tongue, posterior pharynx appear normal  Neck: neck appears normal, no masses, normal ROM, no thyromegaly, no JVD noted CVS: S1-S2 clear, no murmur rubs or gallops, no LE edema, normal pedal pulses; Slightly bradycardic Respiratory:  Diminished to auscultation bilaterally with some wheezing; No appreciable rales or rhonchi. Respiratory effort normal. No accessory muscle use.  Abdomen: Soft Tender, Distended, Slightly diminished bowel sounds, Has Abdominal Incisions that are C/D/I and Colostomy bag that is in place Musculoskeletal: : No cyanosis, clubbing or edema noted  bilaterally Neuro: Cranial nerves II-XII intact Psych: Not anxious and appears come Skin: no rashes or lesions or ulcers on a limited skin evaluation, no induration or nodules   Data reviewed:  I have personally reviewed following labs and imaging studies Labs:  CBC: Recent Labs  Lab 03/15/18 1213  WBC 12.4*  NEUTROABS 8.3*  HGB 14.6  HCT 45.3  MCV 93.8  PLT 096    Basic Metabolic Panel: Recent Labs  Lab 03/15/18 1213  NA 142  K 3.9  CL 106  CO2 27  GLUCOSE 94  BUN 18  CREATININE 0.82  CALCIUM 8.9   GFR Estimated Creatinine Clearance: 61.9 mL/min (by C-G formula based on SCr of 0.82 mg/dL). Liver Function Tests: Recent Labs  Lab 03/15/18 1213  AST 21  ALT 21  ALKPHOS 58   BILITOT 1.0  PROT 6.5  ALBUMIN 4.0   No results for input(s): LIPASE, AMYLASE in the last 168 hours. No results for input(s): AMMONIA in the last 168 hours. Coagulation profile Recent Labs  Lab 03/15/18 1213  INR 1.27    Cardiac Enzymes: No results for input(s): CKTOTAL, CKMB, CKMBINDEX, TROPONINI in the last 168 hours. BNP: Invalid input(s): POCBNP CBG: No results for input(s): GLUCAP in the last 168 hours. D-Dimer No results for input(s): DDIMER in the last 72 hours. Hgb A1c No results for input(s): HGBA1C in the last 72 hours. Lipid Profile No results for input(s): CHOL, HDL, LDLCALC, TRIG, CHOLHDL, LDLDIRECT in the last 72 hours. Thyroid function studies No results for input(s): TSH, T4TOTAL, T3FREE, THYROIDAB in the last 72 hours.  Invalid input(s): FREET3 Anemia work up No results for input(s): VITAMINB12, FOLATE, FERRITIN, TIBC, IRON, RETICCTPCT in the last 72 hours. Urinalysis    Component Value Date/Time   COLORURINE YELLOW 09/25/2017 0341   APPEARANCEUR CLEAR 09/25/2017 0341   LABSPEC 1.009 09/25/2017 0341   PHURINE 6.0 09/25/2017 0341   GLUCOSEU 150 (A) 09/25/2017 0341   HGBUR SMALL (A) 09/25/2017 0341   BILIRUBINUR NEGATIVE 09/25/2017 0341   KETONESUR NEGATIVE 09/25/2017 0341   PROTEINUR NEGATIVE 09/25/2017 0341   UROBILINOGEN 1.0 06/10/2014 1421   NITRITE NEGATIVE 09/25/2017 0341   LEUKOCYTESUR NEGATIVE 09/25/2017 0341   Microbiology No results found for this or any previous visit (from the past 240 hour(s)).  Inpatient Medications:   Scheduled Meds: . insulin aspart  0-15 Units Subcutaneous TID WC  . insulin aspart  0-5 Units Subcutaneous QHS   Continuous Infusions: . lactated ringers 10 mL/hr at 03/20/18 0722  . lactated ringers 10 mL/hr at 03/20/18 0815    Radiological Exams on Admission: Dg C-arm 1-60 Min-no Report  Result Date: 03/20/2018 Fluoroscopy was utilized by the requesting physician.  No radiographic interpretation.     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 s/p Lap Sigmoidectomy and end colostomy and take down of volovesicular fistula with excision of Colon Mesenteric Mass -POD 0 -Management per General Surgery -Pain control by General Surgery  Addisons Disease -Agree with Resuming Home Hydrocortisone 20 mg po BID -If BP drops consider IV Steroides  Hypertension -Resume Home Cardizem -Monitor BP acutely in the post-operative stage -If stable can slowly resume Home Regimen with Irbesartan 37.5 in AM   Hypothyroidism -C/w Levothyroxine 88 mcg po Daily   Depression and Anxiety -C/w Fluoxetine and Gabapentin -C/w Clonazepam 0.25 mg po Daily prn  Atrial Fibrillation -Resume Apixaban when safe -C/w Dofetilide and Diltiazem -C/w Telemetry Monitoring -  Ensure Mag>2.0 and K+>4.0  CAD s/p CABG -Consider Resuming ASA in AM and Nitrostat 0.4 mg SL pRN -C/w Repatha q14 days  GERD -C/w Pantoprazole Substitution   Chronic Pain/Lumbar Radiculopathy -Has a back Stimulator -C/w Pain Control as above  Generalized Weakness -Recommend PT/OT evaluation   Thank you for this consultation.  Our [facility] hospitalist team will follow the patient with you.  Time Spent: 40 minutes  [employer] D.O. [facility] 03/20/2018, 12:55 PM

## 2018-03-20 NOTE — Transfer of Care (Signed)
Immediate Anesthesia Transfer of Care Note  Patient: [name]  Procedure(s) Performed: Procedure(s): LAPAROSCOPIC SIGMOIDECTOMY WITH COLOSTOMY (N/A) LAPAROSCOPIC TAKE DOWN OF COLOVESICAL FISTULA (N/A) CYSTOSCOPY WITH BILATERAL RETROGRADE AND BILATERAL URETERAL CATHETER PLACEMENT (Bilateral)  Patient Location: PACU  Anesthesia Type:General  Level of Consciousness:  sedated, patient cooperative and responds to stimulation  Airway & Oxygen Therapy:Patient Spontanous Breathing and Patient connected to face mask oxgen  Post-op Assessment:  Report given to PACU RN and Post -op Vital signs reviewed and stable  Post vital signs:  Reviewed and stable  Last Vitals:  Vitals:   03/20/18 0651 03/20/18 1232  BP: 128/75 (!) 112/59  Pulse: 72 (!) 52  Resp: 16 17  Temp: 36.8 C 36.6 C  SpO2: 93% 81%    Complications: No apparent anesthesia complications

## 2018-03-20 NOTE — Consult Note (Signed)
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 Dema Severin.  Intraoperative ureteral stent placement is requested.  The patient has no complaints today.  Past Medical History:  Diagnosis Date  . Addison's disease ([place])   . Anxiety   . Arthritis    "everywhere"  . Atrial fibrillation ([facility])    Eliquis  . Chronic bronchitis ([place])    "although I didn't get it this year" (07/23/2017)  . 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  last knee surgery dr. Elmyra Ricks gave large dose of hydrocortisal and avoided the hypotensive side effectas of addison's disease.  . Coronary artery disease   . Depression   . GERD (gastroesophageal reflux disease)   . Hyperlipemia   . Hypertension   . Hypothyroidism   . Lumbar radiculopathy 04/27/2014  . Migraine    "in my 20's" (07/23/2017)  . Recurrent upper respiratory infection (URI)    Past Surgical History:  Procedure Laterality Date  . ACHILLES TENDON SURGERY Left 07/16/2014   bone spur removed  . ANTERIOR LUMBAR FUSION  1980   L4-5  . BACK SURGERY    . CARDIAC CATHETERIZATION  2013  . CARDIAC CATHETERIZATION N/A 09/02/2014   Procedure: Left Heart Cath and Coronary Angiography;  Surgeon: Wellington Hampshire, MD;  Location: [facility];  Service: Cardiovascular;  Laterality: N/A;  . CARDIOVERSION N/A 10/22/2015   Procedure: CARDIOVERSION;  Surgeon: Sanda Klein, MD;  Location: [facility];  Service: Cardiovascular;  Laterality: N/A;  . CARDIOVERSION N/A 06/07/2017   Procedure: CARDIOVERSION;  Surgeon: Larey Dresser, MD;  Location: [facility];  Service: Cardiovascular;  Laterality: N/A;  . CARDIOVERSION N/A 07/05/2017   Procedure: CARDIOVERSION;  Surgeon: Pixie Casino, MD;  Location: [facility];  Service: Cardiovascular;  Laterality: N/A;  . CARPAL  TUNNEL RELEASE Right   . CATARACT EXTRACTION, BILATERAL Bilateral   . COLON SURGERY     laparascopic vs. open sigmoidectomy with cystoscopy  Dr. Dema Severin and Dr. Gloriann Loan 03-20-18  . COLONOSCOPY WITH PROPOFOL N/A 01/17/2018   Procedure: COLONOSCOPY WITH PROPOFOL;  Surgeon: Milus Banister, MD;  Location: [facility];  Service: Endoscopy;  Laterality: N/A;  . CORONARY ARTERY BYPASS GRAFT  06/26/2011   Procedure: CORONARY ARTERY BYPASS GRAFTING (CABG);  Surgeon: Melrose Nakayama, MD;  Location: [facility];  Service: Open Heart Surgery;  Laterality: N/A;  times using Greater Saphenous Vein Graft harvested endoscopically from right leg  . DIAGNOSTIC LAPAROSCOPY    . DILATION AND CURETTAGE OF UTERUS    . JOINT REPLACEMENT    . KNEE ARTHROSCOPY Bilateral   . LAPAROSCOPIC CHOLECYSTECTOMY    . POLYPECTOMY  01/17/2018   Procedure: POLYPECTOMY;  Surgeon: Milus Banister, MD;  Location: [facility];  Service: Endoscopy;;  . REVISION TOTAL KNEE ARTHROPLASTY Right   . SPINAL CORD STIMULATOR IMPLANT  12/10/15  . TONSILLECTOMY    . TOTAL KNEE ARTHROPLASTY Bilateral     Home Medications:  Medications Prior to Admission  Medication Sig Dispense Refill Last Dose  . apixaban (ELIQUIS) 5 MG TABS tablet Take 5 mg by mouth 2 (two) times daily.   03/15/2018  . aspirin EC 81 MG tablet Take 81 mg by mouth every Monday, Wednesday, and Friday.   03/15/2018  . diltiazem (CARDIZEM CD) 180 MG 24 hr capsule Take 1 capsule (180 mg total) by mouth  at bedtime.   03/19/2018 at Unknown time  . dofetilide (TIKOSYN) 125 MCG capsule TAKE ONE CAPSULE TWICE A DAY (Patient taking differently: Take 125 mcg by mouth 2 (two) times daily. ) 180 capsule 3 03/20/2018 at 0500  . Evolocumab (REPATHA SURECLICK) 466 MG/ML SOAJ Inject 1 pen into the skin every 14 (fourteen) days. 2 pen 11 03/18/2018  . FLUoxetine (PROZAC) 10 MG tablet Take 10 mg by mouth every morning.    03/20/2018 at 0500  . hydrocortisone (CORTEF) 20 MG tablet Take 20 mg by  mouth 2 (two) times daily.   03/19/2018 at Unknown time  . hyoscyamine (LEVSIN SL) 0.125 MG SL tablet Place 0.125 mg under the tongue daily as needed for cramping (IBS symptoms).   03/19/2018 at Unknown time  . levothyroxine (SYNTHROID, LEVOTHROID) 88 MCG tablet Take 1 tablet (88 mcg total) by mouth daily before breakfast. 30 tablet 0 03/20/2018 at 0500  . MAGNESIUM PO Take 1,000 mg by mouth daily.    Past Week at Unknown time  . omeprazole (PRILOSEC) 20 MG capsule Take 20 mg by mouth daily.    03/20/2018 at 0500  . potassium chloride (K-DUR) 10 MEQ tablet TAKE TWO TABLETS EVERY DAY (Patient taking differently: Take 20 mEq by mouth daily. ) 60 tablet 11 Past Week at Unknown time  . Probiotic Product (PROBIOTIC FORMULA PO) Take 1 capsule by mouth at bedtime.    Past Week at Unknown time  . pyridoxine (B-6) 200 MG tablet Take 200 mg by mouth daily.   Past Month at Unknown time  . rOPINIRole (REQUIP) 0.25 MG tablet Take 0.5 mg by mouth at bedtime.   03/19/2018 at Unknown time  . valsartan (DIOVAN) 80 MG tablet Take half tablet (40 mg) by mouth each morning. Take additional half (40 mg) by mouth in the afternoon as needed for BP greater than 140/80. (Patient taking differently: Take 80 mg by mouth at bedtime. Take half tablet (40 mg) by mouth each morning. Take additional half (40 mg) by mouth in the afternoon as needed for BP greater than 140/80. ) 90 tablet 3 03/19/2018 at Unknown time  . Vitamin D, Ergocalciferol, (DRISDOL) 50000 UNITS CAPS Take 50,000 Units by mouth 2 (two) times a week. Takes on Tuesdays and Fridays   03/19/2018 at Unknown time  . acetaminophen (TYLENOL) 500 MG tablet Take 500 mg by mouth daily as needed for headache.   More than a month at Unknown time  . Ascorbic Acid (VITAMIN C) 1000 MG tablet Take 1,000 mg by mouth daily.    More than a month at Unknown time  . CLONAZEPAM PO Take 0.25 mg by mouth 2 (two) times daily as needed for anxiety.    More than a month at Unknown time  .  Cyanocobalamin 1000 MCG/ML KIT Inject 1 mL into the muscle every 30 (thirty) days. Take one (1) injection as directed every month.   More than a month at Unknown time  . furosemide (LASIX) 20 MG tablet Take 20 mg by mouth every 3 (three) days.    More than a month at Unknown time  . NITROSTAT 0.4 MG SL tablet ONE TABLET UNDER TONGUE AS NEEDED FOR CHEST PAIN AS DIRECTED (Patient taking differently: Place 0.4 mg under the tongue every 5 (five) minutes as needed for chest pain. ) 25 tablet 6 More than a month at Unknown time  . polyethylene glycol-electrolytes (NULYTELY/GOLYTELY) 420 g solution Take 4,000 mLs by mouth as directed. (Patient not taking: Reported on  03/12/2018) 4000 mL 0 Not Taking at Unknown time   Allergies:  Allergies  Allergen Reactions  . Percocet [Oxycodone-Acetaminophen] Other (See Comments)    Hallucination, pt is not allergic to tylenol(acetaminophen)  . Tramadol Other (See Comments)    Hallucinations.  . Sulfa Antibiotics Itching    Vaginal itching  . Sulfacetamide Sodium Itching    Vaginal itching    Family History  Problem Relation Age of Onset  . Heart attack Father   . Hypertension Mother   . Stroke Mother   . Hypertension Maternal Grandfather   . Colon cancer Neg Hx    Social History:  reports that she is a non-smoker but has been exposed to tobacco smoke. She has been exposed to 0.00 packs per day for the past 0.00 years. She has never used smokeless tobacco. She reports current alcohol use. She reports that she does not use drugs.  ROS: A complete review of systems was performed.  All systems are negative except for pertinent findings as noted. ROS   Physical Exam:  Vital signs in last 24 hours: Temp:  [98.3 F (36.8 C)] 98.3 F (36.8 C) (12/18 0651) Pulse Rate:  [72] 72 (12/18 0651) Resp:  [16] 16 (12/18 0651) BP: (128)/(75) 128/75 (12/18 0651) SpO2:  [97 %] 97 % (12/18 0651) Weight:  [89.4 kg] 89.4 kg (12/18 0645) General:  Alert and oriented,  No acute distress HEENT: Normocephalic, atraumatic Neck: No JVD or lymphadenopathy Cardiovascular: Regular rate and rhythm Lungs: Regular rate and effort Abdomen: Soft, nontender, nondistended, no abdominal masses Back: No CVA tenderness Extremities: No edema Neurologic: Grossly intact  Laboratory Data:  No results found for this or any previous visit (from the past 24 hour(s)). No results found for this or any previous visit (from the past 240 hour(s)). Creatinine: Recent Labs    03/15/18 1213  CREATININE 0.82    Impression/Assessment:  Colovesical fistula  Plan:  Proceed with bilateral ureteral stent placement with cystoscopy.  Risks discussed including but not limited to bleeding, infection, injury to surrounding structures including injury to the ureter, need for additional procedures.  Marton Redwood, III 03/20/2018, 8:22 AM

## 2018-03-20 NOTE — Anesthesia Preprocedure Evaluation (Addendum)
Anesthesia Evaluation  Patient identified by MRN, date of birth, ID band Patient awake    Reviewed: Allergy & Precautions, NPO status , Patient's Chart, lab work & pertinent test results  History of Anesthesia Complications (+) history of anesthetic complications  Airway Mallampati: II  TM Distance: <3 FB Neck ROM: Full    Dental  (+) Teeth Intact, Dental Advisory Given   Pulmonary sleep apnea ,    Pulmonary exam normal breath sounds clear to auscultation       Cardiovascular hypertension, Pt. on medications + CAD and + CABG  Normal cardiovascular exam+ dysrhythmias Atrial Fibrillation  Rhythm:Regular Rate:Normal     Neuro/Psych  Headaches, PSYCHIATRIC DISORDERS Anxiety Depression  Neuromuscular disease    GI/Hepatic Neg liver ROS, GERD  Medicated,  Endo/Other  Hypothyroidism Obesity Addison's disease  Renal/GU negative Renal ROS     Musculoskeletal  (+) Arthritis ,   Abdominal   Peds  Hematology negative hematology ROS (+)   Anesthesia Other Findings Day of surgery medications reviewed with the patient.  Reproductive/Obstetrics                           Anesthesia Physical Anesthesia Plan  ASA: III  Anesthesia Plan: General   Post-op Pain Management:    Induction: Intravenous  PONV Risk Score and Plan: 4 or greater and Dexamethasone, Ondansetron and Treatment may vary due to age or medical condition  Airway Management Planned: Oral ETT  Additional Equipment: Arterial line  Intra-op Plan:   Post-operative Plan: Extubation in OR  Informed Consent: I have reviewed the patients History and Physical, chart, labs and discussed the procedure including the risks, benefits and alternatives for the proposed anesthesia with the patient or authorized representative who has indicated his/her understanding and acceptance.   Dental advisory given  Plan Discussed with: CRNA  Anesthesia  Plan Comments: (2nd LARGE BORE IV Hydrocostisone on induction.)       Anesthesia Quick Evaluation

## 2018-03-20 NOTE — Anesthesia Postprocedure Evaluation (Signed)
Anesthesia Post Note  Patient: [name]  Procedure(s) Performed: LAPAROSCOPIC SIGMOIDECTOMY WITH COLOSTOMY (N/A Abdomen) LAPAROSCOPIC TAKE DOWN OF COLOVESICAL FISTULA (N/A Abdomen) CYSTOSCOPY WITH BILATERAL RETROGRADE AND BILATERAL URETERAL CATHETER PLACEMENT (Bilateral Ureter)     Patient location during evaluation: PACU Anesthesia Type: General Level of consciousness: awake and alert, awake and oriented Pain management: pain level controlled Vital Signs Assessment: post-procedure vital signs reviewed and stable Respiratory status: spontaneous breathing, nonlabored ventilation and respiratory function stable Cardiovascular status: blood pressure returned to baseline and stable Postop Assessment: no apparent nausea or vomiting Anesthetic complications: no    Last Vitals:  Vitals:   03/20/18 1500 03/20/18 1600  BP: 130/60 (!) 148/70  Pulse: 60 (!) 59  Resp: 19 16  Temp:    SpO2: 99% 99%    Last Pain:  Vitals:   03/20/18 1600  TempSrc:   PainSc: 0-No pain                 Catalina Gravel

## 2018-03-20 NOTE — Op Note (Signed)
PATIENT: [name]  77 y.o. female  Patient Care Team: Crist Infante, MD as PCP - General (Internal Medicine) Belva Crome, MD as PCP - Cardiology (Cardiology) Dalton-Bethea, Fabio Asa, MD as Referring Physician (Physical Medicine and Rehabilitation) Michael Boston, MD as Consulting Physician (General Surgery) Milus Banister, MD as Attending Physician (Gastroenterology) Nickie Retort, MD as Consulting Physician (Urology) Jacelyn Pi, MD as Consulting Physician (Endocrinology) Suella Broad, MD as Consulting Physician (Physical Medicine and Rehabilitation)  PREOP DIAGNOSIS: Colovesical fistula; descending colon mesenteric mass  POSTOP DIAGNOSIS: Colovesical fistula; descending colon mesenteric mass  PROCEDURE:  1. Laparoscopic sigmoidectomy with end colostomy 2. Takedown of colovesical fistula 3. Excision of descending colon mesenteric mass (lipoma)  Cystoscopy/stents by Dr. Gloriann Loan (please refer to his operative report)  SURGEON: Sharon Mt. Dema Severin, MD  ASSISTANT: Leighton Ruff, MD  ANESTHESIA: General endotracheal  EBL: 50cc Total I/O In: 2000 [I.V.:2000] Out: 200 [Urine:100; Blood:100]  DRAINS: None  SPECIMEN:  1. Sigmoid colon and distal descending colon 2. Descending colon mesenteric lipoma  COUNTS: Sponge, needle and instrument counts were reported correct x2  FINDINGS:  Sigmoid adherant to bladder - left lateral dome well away from ureteral orifice. All tissues were incredibly fragile likely due to chronic steroid use. Given these findings, the decision was made to perform a Hartmann's procedure as opposed to primary anastomosis. The distal descending colon mesentery where a 3 cm lesion was seen was identified during colon mobilization and found to be a normal appearing lipoma. This "exuded" from the mesentery during mobilization and was sent separately. No other retroperitoneal or mesenteric masses/lesions were notable midway up the descending colon.   The bladder was backfilled with 400 cc of dilute methylene blue and there was no extravasation of dye.  STATEMENT OF MEDICAL NECESSITY: [name] is a very pleasant 27yoF with hx of HTN, HLD, Addison's (on hydrocortisone), Afib, CAD s/p 3v CABG, chronic back pain with neurostimulator for this whom is here today for f/u regarding colovesical fistula. She has a known history of diverticulosis based on prior CT scans as well as colonoscopy reports. 3 mo ago, she is noticed some blood-tinged urine and since that time has had intermittent episodes of what she describes as hematuria as well as some sediment in her urine. She denies any abdominal pain, fever, chills, nausea, vomiting, or changes in her bowel habits. She states she has normal formed brown stools. She was seen by her PCP and subsequently over at [facility] where she underwent a CT scan 11/2017 which demonstrated findings consistent with a colovesical fistula extending from the mid sigmoid colon to the left superior bladder wall. 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 The CT report notes this was present dating back to 2012 and in 2016 demonstrated minimal change. It was 2.1 cm and 2016 and is now 3.3 cm  She is currently taking AZO for chronic mild dysuria that has been present for 12 weeks.  Her last scope 2016 and demonstrated 3 sessile polyps in the transverse, sigmoid and hepatic flexure. Mild diverticulosis in the left colon.   She underwent colonoscopy with Dr. Ardis Hughs 01/17/18 - 3 mm polyp, ascending colon and removed. Multiple small largemouth diverticula in the sigmoid and distal descending colon. This region was edematous, tortuous, and narrowed.  She is here today for follow-up. She was cleared by her PCP, Dr. Joylene Draft for surgery and he stated it was okay  to posterior anticoagulation for 72 hours or longer if needed. He instructed her to  stop her Eliquis 4 days prior to surgery.  She also received clearance from her endocrinologist-Dr. Chalmers Cater at [facility]. For her adrenal insufficiency, perioperatively, he recommended hydrocortisone 50 mg IM/IV starting 30 minutes prior to surgery and every 8 hours until patient is clear to start taking oral medications. Following this, he recommended she start taking her home Cortef 20 mg by mouth twice a day  Options were discussed moving forward and she opted to pursue surgical intervention. Please refer to H&P for details regarding this discussion.  NARRATIVE: Informed consent was verified. The patient was taken to the operating room, placed supine on the operating table and SCD's were applied. General endotracheal anesthesia was induced. The patient was then positioned in the lithotomy position with Allen stirrups and all pressure points were padded and then verified.  Dr. Gloriann Loan then scrubbed; the patient was prepped and draped for placement of ureteral stents and cystoscopy.  Antibiotics had been administered.  Cystoscopy/stents was then performed - please refer to Dr. Purvis Sheffield dictation for details regarding this portion of the procedure.  The patient's abdomen was then prepped and draped in the standard sterile fashion. Surgical timeout confirmed our plan.  An OG tube was in place and to suction.   Given her prior surgical history, a Veress needle was used to access the left upper quadrant at Palmer's point.  Intraperitoneal location was confirmed using the saline drop test and aspiration. Intraperitoneal location was confirmed using the saline drop test and aspiration. Insufflation commenced to 53mmHg of CO2. A 6mm optiview trocar was then placed at this location. Camera inspection revealed no injury.  Three additional 49mm ports were carefully placed under direct laparoscopic visualization - infraumbilical and two additional in the right hemiabdomen.  Bilateral transversus  abdominis plane blocks were performed using a mixture of Exparel and 0.25% Marcaine.   The patient was then positioned in Trendelenburg. The omentum and small bowel was reflected cephalad out of the pelvis.   Sigmoid colon was identified and densely adherent to the bladder.  This was taken down sharply.  A small foci with the probable fistula was identified and taken down sharply.  Care was taken to hug the serosa the colon and stay well away from the bladder.  Following this, a medial to lateral approach was employed.  The rectosigmoid colon was grasped and elevated anteriorly.  The peritoneum overlying the sacral promontory was incised.  The avascular plane between the fascia propria of the rectum and the presacral fascia was identified.  This plane was then developed cephalad and caudad.  Working cephalad, the IMA pedicle was identified.  Working behind/lateral to the IMA pedicle, the left ureter was identified and preserved free of injury.  Lateral attachments of the sigmoid/distal descending colon were freed.  At this point, the IMA was circumferentially dissected.  The left ureter was again confirmed to be down well away from the IMA.  Somewhat distally on the IMA, this was ligated using the Enseal device.  The pedicle was then observed and noted to be hemostatic.  Dissection in the retroperitoneal plane commenced cephalad in the avascular plane. Following this, a lateral approach was utilized. The descending colon was mobilized up to the splenic flexure along the Berish Bohman line of [place]t.   The sigmoid colon was mobilized off the intersigmoid fossa and care was taken to protect the left ureter from injury.  The ureter and stent was  identified and left in place along with the gonadal vessels - posterior to the mesentery.  The sigmoid mesentery was mobilized.  The descending colon was elevated and retracted medially.  The presumed "mass" was identified on the junction of the descending and sigmoid colon.  This  appeared to be a benign appearing, soft, lipoma. This "exuded" from the mesentery during the mobilization. This was removed and passed off as a separate specimen in two separate pieces and the lipoma fell apart with even gentle grasping.  The intervening mesentery from the distal descending colon to the proximal rectum was divided with an Enseal device.  Adequate length of colon was confirmed in the distal descending colon easily the abdominal wall.  The distal point of transection was identified on the proximal rectum.  The mesentery in this area was cleared circumferentially using the Enseal device.  A 12 mm trocar was then placed at the premarked site for a colostomy in the left hemiabdomen.  A laparoscopic powered stapler, Echelon, was then used to divide the colon at the level of the proximal rectum.  This was done using a green load.  Given the quality of her tissues throughout the operation, a primary anastomosis was deemed to be dangerous.   At the site of the 12 mm trocar, a wheal of skin was excised in the subcutaneous tissue down to level of fascia was removed.  The trocar was then removed.  The fascia was widened.  An Alexis wound protector was placed in the wound.  The colon was then delivered from the abdomen.  The point of transection was identified on the descending colon.  The mesentery at this location was cleared using the Enseal device.  This was then stapled using a green load and the specimen passed off.  The colon was placed back in the abdomen and a cap was placed on the [place] wound protector.  The peritoneal cavity was reinsufflated and entire abdomen surveyed. The abdomen was irrigated with sterile saline. Hemostasis was noted.  The bladder was then backfilled with 400 cc of dilute methylene blue and sterile saline.  The area of the presumed colovesical fistula was inspected and there was no blue dye leaking.  The catheter was placed back to drainage.  A grasper was then placed on the  staple line of the descending colon and brought up through the wound protector.  The orientation of the colon was inspected and noted to be normal in course without twisting of the colon/mesentery.  The laparoscopic trochars were removed under direct visualization.  The Alexis wound protector and cap were then removed. All 5 mm port sites were closed with 4-0 Monocryl subcuticular suture and covered with Dermabond.  The fascia of the colostomy site was inspected and noted to be approximately 3-1/2 fingerbreadths.  Given this finding, the fascia was gently closed down using interrupted #1 Novafil sutures.  At this point, the fascial defect was approximately 2 fingerbreadths in diameter.  The colostomy was then matured, after the staple line was removed, using 3-0 Vicryl interrupted sutures.  The colostomy was clearly viable, pink, with healthy bleeding edges and reached the skin without any tension.   A colostomy appliance was then placed.  I then removed the ureteral stents and they were noted to be intact.  The patient was then awakened from general anesthesia, extubated, and transferred to a stretcher for transport to PACU in satisfactory condition.   An MD assistant was necessary for tissue manipulation, retraction and positioning due  to the complexity of the case, obesity of the patient and hospital policies  DISPOSITION: PACU in satisfactory condition

## 2018-03-20 NOTE — Anesthesia Procedure Notes (Signed)
Procedure Name: Intubation Date/Time: 03/20/2018 8:38 AM Performed by: Lavina Hamman, CRNA Pre-anesthesia Checklist: Patient identified, Emergency Drugs available, Suction available, Patient being monitored and Timeout performed Patient Re-evaluated:Patient Re-evaluated prior to induction Oxygen Delivery Method: Circle system utilized Preoxygenation: Pre-oxygenation with 100% oxygen Induction Type: IV induction Ventilation: Mask ventilation without difficulty Laryngoscope Size: Mac and 3 Grade View: Grade II Tube type: Oral Tube size: 7.0 mm Number of attempts: 1 Airway Equipment and Method: Stylet Placement Confirmation: ETT inserted through vocal cords under direct vision,  positive ETCO2,  CO2 detector and breath sounds checked- equal and bilateral Secured at: 21 cm Tube secured with: Tape Dental Injury: Teeth and Oropharynx as per pre-operative assessment

## 2018-03-20 NOTE — Op Note (Signed)
Operative Note  Preoperative diagnosis:  1.  Colovesical fistula  Post operative diagnosis: 1.  Colovesical fistula  Procedure(s): 1.  Cystoscopy with bilateral retrograde pyelogram and bilateral open-ended ureteral catheter placement  Surgeon: Link Snuffer, MD  Assistants: None  Anesthesia: General  Complications: None immediate  EBL: Minimal  Specimens: 1.  None  Drains/Catheters: 1.  Bilateral open-ended ureteral catheters 2.  Foley catheter  Intraoperative findings: 1.  Normal urethra and bladder except for an approximately 1 cm area of inflamed mucosa consistent with fistula towards the dome 2.  Bilateral retrograde pyelogram revealed normal ureter and kidney without any filling defects  Indication: 77 year old female with a history of colovesical fistula presents for colectomy.  Intraoperative stent placement was requested.  Description of procedure:  The patient was identified and consent was obtained.  The patient was taken to the operating room and placed in the supine position.  The patient was placed under general anesthesia.  Perioperative antibiotics were administered.  The patient was placed in dorsal lithotomy.  Patient was prepped and draped in a standard sterile fashion and a timeout was performed.  A 21 French rigid cystoscope was advanced into the urethra and into the bladder.  The left distal most portion of the ureter was cannulated with an open-ended ureteral catheter.  Retrograde pyelogram was performed with the findings noted above.  A sensor wire was then advanced up to the kidney under fluoroscopic guidance.  The open-ended ureteral catheter was advanced over the wire up to the kidney and then the scope and wire were withdrawn keeping the open-ended ureteral catheter in place.   The right distal most portion of the ureter was cannulated with an open-ended ureteral catheter.  Retrograde pyelogram was performed with the findings noted above.  A sensor wire  was then advanced up to the kidney under fluoroscopic guidance.  The open-ended ureteral catheter was advanced over the wire up to the kidney and then the scope and wire were withdrawn keeping the open-ended ureteral catheter in place.Foley catheter was placed and both open-ended ureteral catheters were secured and threaded through into a drainage bag.  This concluded my portion of the operation.  Patient tolerated procedure well and the case was handed over to general surgery.  Plan: Per general surgery.  Urology will be available as needed.

## 2018-03-20 NOTE — Consult Note (Signed)
WOC consult ordered for preoperative marking. This patient was seen at her PAT appointment on 03/15/18 for preoperative marking. Will DC current order for that reason. [place], [place], [place]

## 2018-03-20 NOTE — Consult Note (Signed)
[place] Nurse ostomy consult note Stoma type/location: LUQ, end colostom Stomal assessment/size: NA Peristomal assessment: NA Treatment options for stomal/peristomal skin: NA Output none Ostomy pouching: 1pc from OR in place  Requested while [place] nurse in the unit to assess stoma. Patient just back from [place] consulted noted from surgeon for ostomy care Ostomy in abdominal fold and is skin level  Flexible convex pouch ordered and a belt for anticipated need for first pouch change.  Explained role of ostomy nurse to patient's son who is in the room, but lives in [place]  Patient will either be home alone or need to DC to SNF for a short time.  Enrolled patient in [place] program: No   WOC Nurse will follow along with you for continued support with ostomy teaching and care [place] MSN, [place], [place], [place], [place]

## 2018-03-20 NOTE — H&P (Signed)
CC: Here today for follow-up regarding colovesical fistula and distal descending colon mesenteric mass  HPI: [name] is a very pleasant 70yoF with hx of HTN, HLD, Addison's (on hydrocortisone), Afib, CAD s/p 3v CABG, chronic back pain with neurostimulator for this whom is here today for f/u regarding colovesical fistula. She has a known history of diverticulosis based on prior CT scans as well as colonoscopy reports. 3 mo ago, she is noticed some blood-tinged urine and since that time has had intermittent episodes of what she describes as hematuria as well as some sediment in her urine. She denies any abdominal pain, fever, chills, nausea, vomiting, or changes in her bowel habits. She states she has normal formed brown stools. She was seen by her PCP and subsequently over at [facility] where she underwent a CT scan 11/2017 which demonstrated findings consistent with a colovesical fistula extending from the mid sigmoid colon to the left superior bladder wall. 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 The CT report notes this was present dating back to 2012 and in 2016 demonstrated minimal change. It was 2.1 cm and 2016 and is now 3.3 cm  She is currently taking AZO for chronic mild dysuria that has been present for 12 weeks.  Her last scope 2016 and demonstrated 3 sessile polyps in the transverse, sigmoid and hepatic flexure. Mild diverticulosis in the left colon.   She underwent colonoscopy with Dr. Ardis Hughs 01/17/18 - 3 mm polyp, ascending colon and removed. Multiple small largemouth diverticula in the sigmoid and distal descending colon. This region was edematous, tortuous, and narrowed.  She is here today for follow-up. She was cleared by her PCP, Dr. Joylene Draft for surgery and he stated it was okay to posterior anticoagulation for 72 hours or longer if needed. He instructed her to stop her Eliquis 4 days  prior to surgery.  She also received clearance from her endocrinologist-Dr. Chalmers Cater at [facility]. For her adrenal insufficiency, perioperatively, he recommended hydrocortisone 50 mg IM/IV starting 30 minutes prior to surgery and every 8 hours until patient is clear to start taking oral medications. Following this, he recommended she start taking her home Cortef 20 mg by mouth twice a day  Today, she reports doing well. She denies any fever/chills/nausea/vomiting. She has been having normal bowel movements. She still has sediment in her urine.   PMH: HTN, HLD, Addison's (on hydrocortisone), Afib (on Eliquis), CAD s/p 3v CABG, chronic back pain with neurostimulator  PSH: Anterior lumbar fusion via left lower quadrant transverse abdominal incision done in [place] in the 1980s. Laparoscopic cholecystectomy. She denies any other abdominal operations including C-sections  FHx: Denies FHx of malignancy  Social: Denies use of tobacco/EtOH/drugs  ROS: A comprehensive 10 system review of systems was completed with the patient and pertinent findings as noted above.  Past Medical History:  Diagnosis Date  . Addison's disease ([place])   . Anxiety   . Arthritis    "everywhere"  . Atrial fibrillation (HCC)    Eliquis  . Chronic bronchitis ([place])    "although I didn't get it this year" (07/23/2017)  . 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  last knee surgery dr. Elmyra Ricks gave large dose of hydrocortisal and avoided the hypotensive side effectas of addison's disease.  . Coronary artery disease   . Depression   . GERD (gastroesophageal reflux disease)   .  Hyperlipemia   . Hypertension   . Hypothyroidism   . Lumbar radiculopathy 04/27/2014  . Migraine    "in my 20's" (07/23/2017)  . Recurrent upper respiratory infection (URI)     Past Surgical History:  Procedure Laterality Date  . ACHILLES TENDON SURGERY Left  07/16/2014   bone spur removed  . ANTERIOR LUMBAR FUSION  1980   L4-5  . BACK SURGERY    . CARDIAC CATHETERIZATION  2013  . CARDIAC CATHETERIZATION N/A 09/02/2014   Procedure: Left Heart Cath and Coronary Angiography;  Surgeon: Wellington Hampshire, MD;  Location: [facility];  Service: Cardiovascular;  Laterality: N/A;  . CARDIOVERSION N/A 10/22/2015   Procedure: CARDIOVERSION;  Surgeon: Sanda Klein, MD;  Location: [facility]OSCOPY;  Service: Cardiovascular;  Laterality: N/A;  . CARDIOVERSION N/A 06/07/2017   Procedure: CARDIOVERSION;  Surgeon: Larey Dresser, MD;  Location[facility] ENDOSCOPY;  Service: Cardiovascular;  Laterality: N/A;  . CARDIOVERSION N/A 07/05/2017   Procedure: CARDIOVERSION;  Surgeon: Pixie Casino, MD;  Location: [facility];  Service: Cardiovascular;  Laterality: N/A;  . CARPAL TUNNEL RELEASE Right   . CATARACT EXTRACTION, BILATERAL Bilateral   . COLON SURGERY     laparascopic vs. open sigmoidectomy with cystoscopy  Dr. Dema Severin and Dr. Gloriann Loan 03-20-18  . COLONOSCOPY WITH PROPOFOL N/A 01/17/2018   Procedure: COLONOSCOPY WITH PROPOFOL;  Surgeon: Milus Banister, MD;  Location: [facility] ENDOSCOPY;  Service: Endoscopy;  Laterality: N/A;  . CORONARY ARTERY BYPASS GRAFT  06/26/2011   Procedure: CORONARY ARTERY BYPASS GRAFTING (CABG);  Surgeon: Melrose Nakayama, MD;  Location: [facility];  Service: Open Heart Surgery;  Laterality: N/A;  times using Greater Saphenous Vein Graft harvested endoscopically from right leg  . DIAGNOSTIC LAPAROSCOPY    . DILATION AND CURETTAGE OF UTERUS    . JOINT REPLACEMENT    . KNEE ARTHROSCOPY Bilateral   . LAPAROSCOPIC CHOLECYSTECTOMY    . POLYPECTOMY  01/17/2018   Procedure: POLYPECTOMY;  Surgeon: Milus Banister, MD;  Location: [facility];  Service: Endoscopy;;  . REVISION TOTAL KNEE ARTHROPLASTY Right   . SPINAL CORD STIMULATOR IMPLANT  12/10/15  . TONSILLECTOMY    . TOTAL KNEE ARTHROPLASTY Bilateral     Family History  Problem Relation Age of  Onset  . Heart attack Father   . Hypertension Mother   . Stroke Mother   . Hypertension Maternal Grandfather   . Colon cancer Neg Hx     Social:  reports that she is a non-smoker but has been exposed to tobacco smoke. She has been exposed to 0.00 packs per day for the past 0.00 years. She has never used smokeless tobacco. She reports current alcohol use. She reports that she does not use drugs.  Allergies:  Allergies  Allergen Reactions  . Percocet [Oxycodone-Acetaminophen] Other (See Comments)    Hallucination, pt is not allergic to tylenol(acetaminophen)  . Tramadol Other (See Comments)    Hallucinations.  . Sulfa Antibiotics Itching    Vaginal itching  . Sulfacetamide Sodium Itching    Vaginal itching    Medications: I have reviewed the patient's current medications.  No results found for this or any previous visit (from the past 48 hour(s)).  No results found.  ROS - all of the below systems have been reviewed with the patient and positives are indicated with bold text General: chills, fever or night sweats Eyes: blurry vision or double vision ENT: epistaxis or sore throat Allergy/Immunology: itchy/watery eyes or nasal congestion Hematologic/Lymphatic: bleeding  problems, blood clots or swollen lymph nodes Endocrine: temperature intolerance or unexpected weight changes Breast: new or changing breast lumps or nipple discharge Resp: cough, shortness of breath, or wheezing CV: chest pain or dyspnea on exertion GI: as per HPI GU: dysuria, trouble voiding, or hematuria MSK: joint pain or joint stiffness Neuro: TIA or stroke symptoms Derm: pruritus and skin lesion changes Psych: anxiety and depression  PE Blood pressure 128/75, pulse 72, temperature 98.3 F (36.8 C), temperature source Oral, resp. rate 16, height 5' 4.5" (1.638 m), weight 89.4 kg, SpO2 97 %. Constitutional: NAD; conversant; no deformities Eyes: Moist conjunctiva; no lid lag; anicteric; PERRL Neck:  Trachea midline; no thyromegaly Lungs: Normal respiratory effort; no tactile fremitus CV: RRR; no palpable thrills; no pitting edema GI: Abd obese soft, nontender, nondistended; no palpable hepatosplenomegaly MSK: Normal gait; no clubbing/cyanosis Psychiatric: Appropriate affect; alert and oriented x3 Lymphatic: No palpable cervical or axillary lymphadenopathy  A/P: [name] is a very pleasant 75yoF with hx of HTN, HLD, Addison's disease (on chronic hydrocortisone), Afib (on Eliquis), CAD s/p 3v CABG here today for f/u evaluation of colovesical fistula and distal descending colon mesentery mass (3.3 cm in size on most recent imaging)  -Cardiac clearance obtained-Dr. Smith-cleared her for stopping anticoagulation for 72 hours or longer if needed. -Endocrinology clearance obtained-Dr. Chalmers Cater recommended hydrocortisone 50 mg IV 30 minutes prior to surgery, then every 8 hours until patient is taking by mouth. Following this, restart her Cortef 20 mg by mouth twice a day  -The anatomy physiology of the GI tract was discussed at length with associated pictures as was the pathophysiology of diverticulitis and colovesical fistulas today. 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 Risks of further observation include continuing urinary issues and UTIs as well as potential worsening of her current condition. -Will plan laparoscopic versus open sigmoid colectomy and removal of the distal descending colon and its associated mesentery with the mass. This would be done with cystoscopy/stents by urology. We discussed the possibility of a stoma which may be permanent  -The planned procedure, material risks (including, but not limited to, pain, bleeding, infection, scarring, need for blood transfusion,  damage to surrounding structures- blood vessels/nerves/viscus/organs, damage to ureter, urine leak, leak from anastomosis, need for additional procedures, need for stoma which may be permanent, hernia, recurrence, pneumonia, heart attack, stroke, death) benefits and alternatives to surgery were discussed at length. I noted a good probability that the procedure would help improve her symptoms. The patient's questions were answered to her satisfaction, she voiced understanding and elected to proceed with surgery. Additionally, we discussed typical postoperative expectations and the recovery process.  Sharon [place]. Dema Severin, M.D. General and Colorectal Surgery [facility]

## 2018-03-21 ENCOUNTER — Encounter (HOSPITAL_COMMUNITY): Payer: Self-pay | Admitting: Surgery

## 2018-03-21 DIAGNOSIS — E271 Primary adrenocortical insufficiency: Secondary | ICD-10-CM

## 2018-03-21 LAB — PHOSPHORUS: Phosphorus: 4 mg/dL (ref 2.5–4.6)

## 2018-03-21 LAB — GLUCOSE, CAPILLARY
Glucose-Capillary: 145 mg/dL — ABNORMAL HIGH (ref 70–99)
Glucose-Capillary: 180 mg/dL — ABNORMAL HIGH (ref 70–99)
Glucose-Capillary: 196 mg/dL — ABNORMAL HIGH (ref 70–99)
Glucose-Capillary: 172 mg/dL — ABNORMAL HIGH (ref 70–99)

## 2018-03-21 LAB — CBC
HCT: 37.2 % (ref 36.0–46.0)
Hemoglobin: 11.7 g/dL — ABNORMAL LOW (ref 12.0–15.0)
MCH: 30.5 pg (ref 26.0–34.0)
MCHC: 31.5 g/dL (ref 30.0–36.0)
MCV: 96.9 fL (ref 80.0–100.0)
Platelets: 209 10*3/uL (ref 150–400)
RBC: 3.84 MIL/uL — ABNORMAL LOW (ref 3.87–5.11)
RDW: 12.8 % (ref 11.5–15.5)
WBC: 14.9 10*3/uL — ABNORMAL HIGH (ref 4.0–10.5)
nRBC: 0 % (ref 0.0–0.2)

## 2018-03-21 LAB — BASIC METABOLIC PANEL
Anion gap: 11 (ref 5–15)
BUN: 13 mg/dL (ref 8–23)
CALCIUM: 8.4 mg/dL — AB (ref 8.9–10.3)
CO2: 24 mmol/L (ref 22–32)
Chloride: 106 mmol/L (ref 98–111)
Creatinine, Ser: 0.85 mg/dL (ref 0.44–1.00)
GFR calc Af Amer: >60 mL/min (ref >60)
GFR calc non Af Amer: >60 mL/min (ref >60)
Glucose, Bld: 177 mg/dL — ABNORMAL HIGH (ref 70–99)
Potassium: 5.2 mmol/L — ABNORMAL HIGH (ref 3.5–5.1)
Sodium: 141 mmol/L (ref 135–145)

## 2018-03-21 LAB — MAGNESIUM: Magnesium: 2 mg/dL (ref 1.7–2.4)

## 2018-03-21 MED ORDER — MENTHOL 3 MG MT LOZG
1.0000 | LOZENGE | OROMUCOSAL | Status: DC | PRN
  Administered 2018-03-21: 3 mg via ORAL
  Filled 2018-03-21: qty 9

## 2018-03-21 MED ORDER — HYDRALAZINE HCL 20 MG/ML IJ SOLN
10.0000 mg | INTRAMUSCULAR | Status: DC | PRN
  Administered 2018-03-21: 10 mg via INTRAVENOUS
  Filled 2018-03-21: qty 1

## 2018-03-21 NOTE — Evaluation (Signed)
Occupational Therapy Evaluation Patient Details Name: [name] MRN: [id number] DOB: 1940/07/09 Today's Date: 03/21/2018    History of Present Illness Pt is s/p laparoscopic-assisted sigmoidectomy, colostomy, takedown of fistula and bil ureteral stents.  PMH:  chronic back pain--neurostimulator; CABG, CAD, AFib, Addison's disease and HTN     Clinical Impression   This 77 year old female was admitted for the above sxs. She is independent with adls/iadls at baseline and lives alone.  She will benefit from continued OT in acute setting with supervision level goals in acute setting and she currently needs min A for ambulating and up to max A for LB adls. Pt lives alone and will benefit from SNF to return to an independent level at home    Follow Up Recommendations  SNF    Equipment Recommendations  Other (comment)    Recommendations for Other Services       Precautions / Restrictions Precautions Precautions: Fall Precaution Comments: abd sx Restrictions Weight Bearing Restrictions: No      Mobility Bed Mobility Overal bed mobility: Needs Assistance Bed Mobility: Rolling;Sidelying to Sit Rolling: Supervision Sidelying to sit: Supervision          Transfers Overall transfer level: Needs assistance Equipment used: Rolling walker (2 wheeled) Transfers: Sit to/from Stand Sit to Stand: Min guard         General transfer comment: for safety and cues for UE placement    Balance                                           ADL either performed or assessed with clinical judgement   ADL Overall ADL's : Needs assistance/impaired Eating/Feeding: Independent   Grooming: Brushing hair;Min guard;Standing   Upper Body Bathing: Set up;Sitting   Lower Body Bathing: Moderate assistance;Sit to/from stand   Upper Body Dressing : Minimal assistance;Sitting   Lower Body Dressing: Maximal assistance;Sitting/lateral leans   Toilet Transfer: Minimal  assistance;Ambulation;RW(chair)             General ADL Comments: ambulated to bathroom for grooming then in hall.       Vision         Perception     Praxis      Pertinent Vitals/Pain Pain Assessment: Faces Faces Pain Scale: Hurts little more Pain Location: pain mostly from back Pain Descriptors / Indicators: Aching Pain Intervention(s): Limited activity within patient's tolerance;Monitored during session;Repositioned     Hand Dominance     Extremity/Trunk Assessment Upper Extremity Assessment Upper Extremity Assessment: Defer to OT evaluation           Communication Communication Communication: No difficulties   Cognition Arousal/Alertness: Awake/alert Behavior During Therapy: WFL for tasks assessed/performed Overall Cognitive Status: Within Functional Limits for tasks assessed                                     General Comments       Exercises     Shoulder Instructions      Home Living Family/patient expects to be discharged to:: Private residence Living Arrangements: Alone   Type of Home: House Home Access: Stairs to enter [employer] of Steps: 2   Home Layout: Able to live on main level with bedroom/bathroom;Bed/bath upstairs     Bathroom Shower/Tub: Tub/shower unit  Home Equipment: [place] - 2 wheels;Cane - single point;Shower seat;Bedside commode(from husband before he died)          Prior Functioning/Environment Level of Independence: Independent                 OT Problem List: Decreased activity tolerance;Impaired balance (sitting and/or standing);Pain;Decreased knowledge of use of DME or AE;Decreased knowledge of precautions      OT Treatment/Interventions: Self-care/ADL training;Energy conservation;Patient/family education;Balance training;Therapeutic activities;DME and/or AE instruction    OT Goals(Current goals can be found in the care plan section) Acute Rehab OT Goals Patient  Stated Goal: return to independence OT Goal Formulation: With patient Time For Goal Achievement: 04/04/18 Potential to Achieve Goals: Good ADL Goals Pt Will Transfer to Toilet: with supervision;ambulating;bedside commode Pt Will Perform Toileting - Clothing Manipulation and hygiene: with supervision;sit to/from stand Additional ADL Goal #1: pt will complete basic ADL at supervision level with AE as needed  OT Frequency: Min 2X/week   Barriers to D/C:            Co-evaluation PT/OT/SLP Co-Evaluation/Treatment: Yes Reason for Co-Treatment: For patient/therapist safety PT goals addressed during session: Mobility/safety with mobility OT goals addressed during session: ADL's and self-care      AM-PAC OT "6 Clicks" Daily Activity     Outcome Measure Help from another person eating meals?: None Help from another person taking care of personal grooming?: A Little Help from another person toileting, which includes using toliet, bedpan, or urinal?: A Lot Help from another person bathing (including washing, rinsing, drying)?: A Lot Help from another person to put on and taking off regular upper body clothing?: A Little Help from another person to put on and taking off regular lower body clothing?: A Lot 6 Click Score: 16   End of Session    Activity Tolerance: Patient tolerated treatment well Patient left: in chair;with call bell/phone within reach;with family/visitor present  OT Visit Diagnosis: Muscle weakness (generalized) (M62.81);Unsteadiness on feet (R26.81)                Time: 6283-1517 OT Time Calculation (min): 28 min Charges:  OT General Charges $OT Visit: 1 Visit OT Evaluation $OT Eval Low Complexity: [place], OTR/L Acute Rehabilitation Services [phone] [facility] pager [phone] office 03/21/2018  [place] 03/21/2018, 9:41 AM

## 2018-03-21 NOTE — Consult Note (Signed)
[place] Nurse ostomy follow up Stoma type/location: LUQ, end colostomy Stomal assessment/size: oval shaped, flush Peristomal assessment: NA Treatment options for stomal/peristomal skin: NA Output none Ostomy pouching: 1pc  From the OR.  Education provided:  [place] Nurse will follow along with you for continued support with ostomy teaching and care Explained role of ostomy nurse and creation of stoma  Education on emptying when 1/3 to 1/2 full and how to empty Discussed risk of peristomal hernia; patient was questioning when she can lift her dog and drive. Explained rationale for no lifting and that the surgeon would be able to give her a timeline on driving.  Answered patient/family questions:   Patient had questions about her IBD status and diet.  Supplies in the room; flexible convex and belt. Shown to patient today Son-in-law in the room, he is a Marine scientist by trade but runs his [employer] in [place].  Patient plans to DC to rehab if possible  Will plan to perform first pouch change with patient and family friend in the am 03/22/18 Enrolled patient in [place] Start Discharge program: Yes  [place] Nurse will follow along with you for continued support with ostomy teaching and care [place] MSN, RN, [place], [place], [place]

## 2018-03-21 NOTE — Progress Notes (Signed)
[facility] PROGRESS NOTE    Progress Note  [name]  VEH:[id number] DOB: 29-Aug-1940 DOA: 03/20/2018 PCP: Crist Infante, MD     Brief Narrative:   [name] is an 77 y.o. 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  She also had bilateral stent placement with cystoscopy.  Assessment/Plan:   Active Problems:   S/P laparoscopic-assisted sigmoidectomy   Colonic mass Pain management and further management per surgery.  Addison's disease: Blood pressure seems to be stable, with a heart rate in the 60s Will continue current dose of steroids at her home dose.  Essential hypertension: Continue Cardizem, will continue to monitor blood pressure.  Hypothyroidism: Continue Synthroid.  Depression and anxiety: Continue fluoxetine gabapentin and clonazepam.  Chronic atrial fibrillation: Rate controlled on diltiazem and Dofetilide. Keep magnesium greater than 2 potassium greater than 4. When to resume Eliquis to be determined by surgery.  CAD status post CABG: Continue aspirin and nitroglycerin as needed.  Chronic pain/lumbar radiculopathy: Has a back stimulator. No further pain, will continue to follow along with you.  DVT prophylaxis: lovenxo Family Communication:son Disposition Plan/Barrier to D/C: per surgery Code Status:     Code Status Orders  (From admission, onward)         Start     Ordered   03/20/18 1420  Full code  Continuous     03/20/18 1420        Code Status History    Date Active Date Inactive Code Status Order ID Comments User Context   09/24/2017 1707 09/24/2017 1803 DNR [id number]   Karmen Bongo, MD ED   07/23/2017 1234 07/27/2017 1656 Full Code [id number]  Baldwin Jamaica, PA-C Inpatient   07/03/2017 1822 07/06/2017 1541 Full Code [id number]  Daune Perch, NP Inpatient   03/22/2016 1335 03/25/2016 1604 Full Code [id number]  Brenton Grills, PA-C ED   09/02/2014 1636 09/03/2014 1437 Full Code [id number]  Wellington Hampshire, MD Inpatient   09/01/2014 2110 09/02/2014 1636 Full Code [id number]  Consuelo Pandy, PA-C Inpatient   07/09/2011 0041 07/09/2011 2126 Full Code [id number]  Dalbert Mayotte, LPN Inpatient   0/12/2328 1243 06/28/2011 1302 Full Code [id number]  Signe Colt, RN Inpatient    Advance Directive Documentation     Most Recent Value  Type of Advance Directive  Healthcare Power of Attorney, Living will  Pre-existing out of facility DNR order (yellow form or pink MOST form)  -  "MOST" Form in Place?  -        IV Access:    Peripheral IV   Procedures and diagnostic studies:   Dg C-arm 1-60 Min-no Report  Result Date: 03/20/2018 Fluoroscopy was utilized by the requesting physician.  No radiographic interpretation.     Medical Consultants:    None.  Anti-Infectives:   Cephoteetan  Subjective:    [name] she relates she feels great no dizziness upon standing in a good mood and actually hungry.  Objective:    Vitals:   03/21/18 0300 03/21/18 0342 03/21/18 0400 03/21/18 0500  BP: 125/79  (!) 141/60   Pulse: 71  66   Resp: 16  17   Temp:  98.1 F (36.7 C)    TempSrc:  Oral    SpO2: 95%  94%   Weight:    93.7 kg  Height:        Intake/Output Summary (Last 24 hours) at 03/21/2018 0749 Last data filed at 03/21/2018 0400 Gross per 24 hour  Intake 3486.79 ml  Output 1600 ml  Net 1886.79 ml   Filed Weights   03/20/18 0645 03/21/18 0500  Weight: 89.4 kg 93.7 kg    Exam: General exam: In no acute distress. Respiratory system: Good air movement and clear to auscultation. Cardiovascular system: S1 & S2 heard,  RRR. Gastrointestinal system: Abdomen is nondistended, soft and nontender.  Central nervous system: Alert and oriented. No focal neurological deficits. Extremities: No pedal edema. Skin: No rashes, lesions or ulcers Psychiatry: Judgement and insight appear normal. Mood & affect appropriate.    Data Reviewed:    Labs: Basic Metabolic Panel: Recent Labs  Lab 03/15/18 1213 03/21/18 0312  NA 142 141  K 3.9 5.2*  CL 106 106  CO2 27 24  GLUCOSE 94 177*  BUN 18 13  CREATININE 0.82 0.85  CALCIUM 8.9 8.4*  MG  --  2.0  PHOS  --  4.0   GFR Estimated Creatinine Clearance: 61.1 mL/min (by C-G formula based on SCr of 0.85 mg/dL). Liver Function Tests: Recent Labs  Lab 03/15/18 1213  AST 21  ALT 21  ALKPHOS 58  BILITOT 1.0  PROT 6.5  ALBUMIN 4.0   No results for input(s): LIPASE, AMYLASE in the last 168 hours. No results for input(s): AMMONIA in the last 168 hours. Coagulation profile Recent Labs  Lab 03/15/18 1213  INR 1.27    CBC: Recent Labs  Lab 03/15/18 1213 03/21/18 0312  WBC 12.4* 14.9*  NEUTROABS 8.3*  --   HGB 14.6 11.7*  HCT 45.3 37.2  MCV 93.8 96.9  PLT 295 209   Cardiac Enzymes: No results for input(s): CKTOTAL, CKMB, CKMBINDEX, TROPONINI in the last 168 hours. BNP (last 3 results) No results for input(s): PROBNP in the last 8760 hours. CBG: Recent Labs  Lab 03/20/18 1708 03/20/18 2142  GLUCAP 181* 155*   D-Dimer: No results for input(s): DDIMER in the last 72 hours. Hgb A1c: No results for input(s): HGBA1C in the last 72 hours. Lipid Profile: No results for input(s): CHOL, HDL, LDLCALC, TRIG, CHOLHDL, LDLDIRECT in the last 72 hours. Thyroid function studies: No results for input(s): TSH, T4TOTAL, T3FREE, THYROIDAB in the last 72 hours.  Invalid input(s): FREET3 Anemia work up: No results for input(s): VITAMINB12, FOLATE, FERRITIN, TIBC, IRON, RETICCTPCT in the last 72 hours. Sepsis Labs: Recent Labs  Lab 03/15/18 1213  03/21/18 0312  WBC 12.4* 14.9*   Microbiology Recent Results (from the past 240 hour(s))  MRSA PCR Screening     Status: None   Collection Time: 03/20/18  2:21 PM  Result Value Ref Range Status   MRSA by PCR NEGATIVE NEGATIVE Final    Comment:        The GeneXpert MRSA Assay (FDA approved for NASAL specimens only), is one component of a comprehensive MRSA colonization surveillance program. It is not intended to diagnose MRSA infection nor to guide or monitor treatment for MRSA infections. Performed at [facility], [place] [address] [place], [place] [address]      Medications:   . acetaminophen  1,000 mg Oral Q6H  . alvimopan  12 mg Oral BID  . diltiazem  180 mg Oral QHS  . dofetilide  125 mcg Oral BID  .  feeding supplement  237 mL Oral BID BM  . FLUoxetine  10 mg Oral q morning - 10a  . heparin injection (subcutaneous)  5,000 Units Subcutaneous Q8H  . hydrocortisone  20 mg Oral BID  . insulin aspart  0-15 Units Subcutaneous TID WC  . insulin aspart  0-5 Units Subcutaneous QHS  . irbesartan  37.5 mg Oral Daily  . levothyroxine  88 mcg Oral QAC breakfast  . mouth rinse  15 mL Mouth Rinse BID  . pantoprazole  40 mg Oral Daily  . rOPINIRole  0.5 mg Oral QHS   Continuous Infusions: . lactated ringers 100 mL/hr at 03/21/18 0400     LOS: 1 day   Charlynne Cousins  [facility]   *Please refer to [email or web address], password [email or web address] to get updated schedule on who will round on this patient, as hospitalists switch teams weekly. If 7PM-7AM, please contact night-coverage at [email or web address], password [email or web address] for any overnight needs.  03/21/2018, 7:49 AM

## 2018-03-21 NOTE — Evaluation (Signed)
Physical Therapy Evaluation Patient Details Name: [name] MRN: [id number] DOB: [birth year] Today's Date: 03/21/2018   History of Present Illness  Pt is s/p laparoscopic-assisted sigmoidectomy, colostomy, takedown of fistula and bil ureteral stents.  PMH:  chronic back pain--neurostimulator; CABG, CAD, AFib, Addison's disease and HTN    Clinical Impression  The patient is mobilizing with 1 assist. Patient lives aloine. Pt admitted with above diagnosis. Pt currently with functional limitations due to the deficits listed below (see PT Problem List).  Pt will benefit from skilled PT to increase their independence and safety with mobility to allow discharge to the venue listed below.       Follow Up Recommendations SNF    Equipment Recommendations  None recommended by PT    Recommendations for Other Services       Precautions / Restrictions Precautions Precautions: Fall Precaution Comments: abd sx Restrictions Weight Bearing Restrictions: No      Mobility  Bed Mobility Overal bed mobility: Needs Assistance Bed Mobility: Rolling;Sidelying to Sit Rolling: Supervision Sidelying to sit: Supervision          Transfers Overall transfer level: Needs assistance Equipment used: Rolling walker (2 wheeled) Transfers: Sit to/from Stand Sit to Stand: Min guard         General transfer comment: for safety and cues for UE placement  Ambulation/Gait Ambulation/Gait assistance: Min assist Gait Distance (Feet): 100 Feet Assistive device: Rolling walker (2 wheeled) Gait Pattern/deviations: Step-through pattern;Decreased stride length     General Gait Details: assist  for sitting, cues for hand position  Stairs            Wheelchair Mobility    Modified Rankin (Stroke Patients Only)       Balance                                             Pertinent Vitals/Pain Pain Assessment: Faces Faces Pain Scale: Hurts little more Pain Location:  pain mostly from back Pain Descriptors / Indicators: Aching Pain Intervention(s): Monitored during session;Repositioned    Home Living Family/patient expects to be discharged to:: Private residence Living Arrangements: Alone   Type of Home: House Home Access: Stairs to enter   Technical brewer of Steps: 2 Home Layout: Able to live on main level with bedroom/bathroom;Bed/bath upstairs Home Equipment: Walker - 2 wheels;Cane - single point;Shower seat;Bedside commode(from husband before he died)      Prior Function Level of Independence: Independent               Hand Dominance        Extremity/Trunk Assessment   Upper Extremity Assessment Upper Extremity Assessment: Defer to OT evaluation    Lower Extremity Assessment Lower Extremity Assessment: Generalized weakness    Cervical / Trunk Assessment Cervical / Trunk Assessment: Normal  Communication   Communication: No difficulties  Cognition Arousal/Alertness: Awake/alert Behavior During Therapy: WFL for tasks assessed/performed Overall Cognitive Status: Within Functional Limits for tasks assessed                                        General Comments      Exercises     Assessment/Plan    PT Assessment Patient needs continued PT services  PT Problem List Decreased strength;Decreased activity tolerance;Decreased mobility;Decreased knowledge of  precautions;Decreased safety awareness;Decreased knowledge of use of DME;Pain       PT Treatment Interventions DME instruction;Therapeutic exercise;Gait training;Functional mobility training;Therapeutic activities;Patient/family education    PT Goals (Current goals can be found in the Care Plan section)  Acute Rehab PT Goals Patient Stated Goal: return to independence PT Goal Formulation: With patient Time For Goal Achievement: 04/04/18 Potential to Achieve Goals: Good    Frequency Min 2X/week   Barriers to discharge Decreased caregiver  support      Co-evaluation PT/OT/SLP Co-Evaluation/Treatment: Yes Reason for Co-Treatment: For patient/therapist safety PT goals addressed during session: Mobility/safety with mobility OT goals addressed during session: ADL's and self-care       AM-PAC PT "6 Clicks" Mobility  Outcome Measure Help needed turning from your back to your side while in a flat bed without using bedrails?: A Little Help needed moving from lying on your back to sitting on the side of a flat bed without using bedrails?: A Little Help needed moving to and from a bed to a chair (including a wheelchair)?: A Little Help needed standing up from a chair using your arms (e.g., wheelchair or bedside chair)?: A Little Help needed to walk in hospital room?: A Little Help needed climbing 3-5 steps with a railing? : A Lot 6 Click Score: 17    End of Session   Activity Tolerance: Patient tolerated treatment well Patient left: in chair;with call bell/phone within reach;with family/visitor present Nurse Communication: Mobility status PT Visit Diagnosis: Unsteadiness on feet (R26.81)    Time: 2330-0762 PT Time Calculation (min) (ACUTE ONLY): 28 min   Charges:   PT Evaluation $PT Eval Low Complexity: [place] Pager [phone] Office [phone]   Claretha Cooper 03/21/2018, 10:00 AM

## 2018-03-21 NOTE — Progress Notes (Signed)
Subjective No acute events. Feeling well this morning. Denies n/v. Ambulating with assistance.   Objective: Vital signs in last 24 hours: Temp:  [97.3 F (36.3 C)-98.1 F (36.7 C)] 98.1 F (36.7 C) (12/19 0342) Pulse Rate:  [51-82] 66 (12/19 0400) Resp:  [13-23] 17 (12/19 0400) BP: (103-159)/(53-90) 141/60 (12/19 0400) SpO2:  [94 %-100 %] 94 % (12/19 0400) Weight:  [93.7 kg] 93.7 kg (12/19 0500)    Intake/Output from previous day: 12/18 0701 - 12/19 0700 In: 3486.8 [I.V.:3486.8] Out: 1600 [Urine:1500; Blood:100] Intake/Output this shift: No intake/output data recorded.  Gen: NAD, comfortable CV: RRR Pulm: Normal work of breathing Abd: Soft, NT/ND; colostomy pink, patent; sweat in appliance. Ext: SCDs in place  Lab Results: CBC  Recent Labs    03/21/18 0312  WBC 14.9*  HGB 11.7*  HCT 37.2  PLT 209   BMET Recent Labs    03/21/18 0312  NA 141  K 5.2*  CL 106  CO2 24  GLUCOSE 177*  BUN 13  CREATININE 0.85  CALCIUM 8.4*   PT/INR No results for input(s): LABPROT, INR in the last 72 hours. ABG No results for input(s): PHART, HCO3 in the last 72 hours.  Invalid input(s): PCO2, PO2  Studies/Results:  Anti-infectives: Anti-infectives (From admission, onward)   Start     Dose/Rate Route Frequency Ordered Stop   03/20/18 1400  neomycin (MYCIFRADIN) tablet 1,000 mg  Status:  Discontinued     1,000 mg Oral 3 times per day 03/20/18 0644 03/20/18 0645   03/20/18 1400  metroNIDAZOLE (FLAGYL) tablet 1,000 mg  Status:  Discontinued     1,000 mg Oral 3 times per day 03/20/18 0644 03/20/18 0645   03/20/18 0645  cefoTEtan (CEFOTAN) 2 g in sodium chloride 0.9 % 100 mL IVPB     2 g 200 mL/hr over 30 Minutes Intravenous On call to O.R. 03/20/18 0644 03/20/18 0840       Assessment/Plan: Patient Active Problem List   Diagnosis Date Noted  . S/P laparoscopic-assisted sigmoidectomy 03/20/2018  . Colonic mass 03/20/2018  . History of adenomatous polyp of colon   .  Polyp of ascending colon   . 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 Orthostatic hypotension 09/24/2017  . Visit for monitoring Tikosyn therapy 07/23/2017  . Depression 06/15/2017  . Acute respiratory failure with hypoxia ([place])   . Hypothyroidism   . Bronchitis 03/22/2016  . Abnormal CT of the chest 03/16/2016  . Coronary artery disease involving coronary bypass graft of native heart with angina pectoris ([place]) 03/16/2016  . Chronic anticoagulation 10/06/2015  . On dofetilide therapy 10/06/2015  . Atrial fibrillation ([place]) 09/15/2015  . Snoring 02/17/2015  . Postoperative examination 12/22/2014  . Chronic low back pain 11/09/2014  . Disc degeneration, lumbar 11/09/2014  . Addison disease ([place]) 09/30/2014  . Chronic back pain 09/30/2014  . Hematochezia-(Hgb stable) 09/03/2014  . DOE (dyspnea on exertion) 09/01/2014  . Lumbar radiculopathy 04/27/2014  . UTI (lower urinary tract infection) 07/09/2011  . Nausea and vomiting 07/08/2011  . Osteoarthritis 11/18/2008  . Hyperlipidemia 11/17/2008  . Essential hypertension 11/17/2008   s/p Procedure(s): LAPAROSCOPIC SIGMOIDECTOMY WITH COLOSTOMY LAPAROSCOPIC TAKE DOWN OF COLOVESICAL FISTULA CYSTOSCOPY WITH BILATERAL RETROGRADE AND BILATERAL URETERAL CATHETER PLACEMENT 03/20/2018  -Recovering well -Ambulate multiple times per day - PT/OT ordered as well -Medicine following for help with managing comorbidities and Addison's - appreciate assistance in her care -Advance to bariatric full liquids -If ok with medicine, can plan transfer to ward later today - anticipate telemetry -  Continue foley catheter to gravity drainage given takedown of colovesical fistula - will leave foley in place and plan outpatient cystogram prior to removal -Will plan to hold therapeutic anticoagulation until hgb stable and at least 2d out from surgery - will reassess tomorrow -PPx: SQH, SCDs, home PPI -Dispo: Pending PT/OT evaluation and trajectory of recovery - Homehealth vs  SNF based on this   LOS: 1 day   Sharon Mt. Dema Severin, M.D. [facility]

## 2018-03-22 LAB — CBC
HCT: 35.2 % — ABNORMAL LOW (ref 36.0–46.0)
Hemoglobin: 11.2 g/dL — ABNORMAL LOW (ref 12.0–15.0)
MCH: 30.9 pg (ref 26.0–34.0)
MCHC: 31.8 g/dL (ref 30.0–36.0)
MCV: 97 fL (ref 80.0–100.0)
Platelets: 217 10*3/uL (ref 150–400)
RBC: 3.63 MIL/uL — ABNORMAL LOW (ref 3.87–5.11)
RDW: 13.2 % (ref 11.5–15.5)
WBC: 13.8 10*3/uL — ABNORMAL HIGH (ref 4.0–10.5)
nRBC: 0 % (ref 0.0–0.2)

## 2018-03-22 LAB — GLUCOSE, CAPILLARY
GLUCOSE-CAPILLARY: 224 mg/dL — AB (ref 70–99)
Glucose-Capillary: 142 mg/dL — ABNORMAL HIGH (ref 70–99)
Glucose-Capillary: 155 mg/dL — ABNORMAL HIGH (ref 70–99)
Glucose-Capillary: 154 mg/dL — ABNORMAL HIGH (ref 70–99)

## 2018-03-22 LAB — PHOSPHORUS: Phosphorus: 2.9 mg/dL (ref 2.5–4.6)

## 2018-03-22 LAB — MAGNESIUM: Magnesium: 2.2 mg/dL (ref 1.7–2.4)

## 2018-03-22 LAB — BASIC METABOLIC PANEL
ANION GAP: 7 (ref 5–15)
BUN: 18 mg/dL (ref 8–23)
CO2: 27 mmol/L (ref 22–32)
Calcium: 8.5 mg/dL — ABNORMAL LOW (ref 8.9–10.3)
Chloride: 103 mmol/L (ref 98–111)
Creatinine, Ser: 0.94 mg/dL (ref 0.44–1.00)
GFR calc Af Amer: >60 mL/min (ref >60)
GFR calc non Af Amer: 58 mL/min — ABNORMAL LOW (ref >60)
GLUCOSE: 138 mg/dL — AB (ref 70–99)
Potassium: 4.2 mmol/L (ref 3.5–5.1)
Sodium: 137 mmol/L (ref 135–145)

## 2018-03-22 MED ORDER — IBUPROFEN 200 MG PO TABS
600.0000 mg | ORAL_TABLET | Freq: Four times a day (QID) | ORAL | Status: DC | PRN
  Administered 2018-03-22 – 2018-03-28 (×6): 600 mg via ORAL
  Filled 2018-03-22 (×6): qty 3

## 2018-03-22 MED ORDER — APIXABAN 5 MG PO TABS
5.0000 mg | ORAL_TABLET | Freq: Two times a day (BID) | ORAL | Status: DC
  Administered 2018-03-22 – 2018-04-02 (×23): 5 mg via ORAL
  Filled 2018-03-22 (×23): qty 1

## 2018-03-22 MED ORDER — TRAMADOL HCL 50 MG PO TABS: 50.0000 mg | ORAL_TABLET | Freq: Four times a day (QID) | ORAL | 0 refills | Status: AC | PRN

## 2018-03-22 NOTE — Progress Notes (Signed)
Subjective No acute events. Feeling well. Tolerating full liquids without n/v. Ambulating with assistance. Having gas and now some stool from colostomy.  Objective: Vital signs in last 24 hours: Temp:  [98.2 F (36.8 C)-98.9 F (37.2 C)] 98.6 F (37 C) (12/20 0400) Pulse Rate:  [56-73] 71 (12/20 0630) Resp:  [12-23] 21 (12/20 0630) BP: (74-168)/(20-76) 158/72 (12/20 0630) SpO2:  [89 %-97 %] 96 % (12/20 0630) Weight:  [94.6 kg] 94.6 kg (12/20 0500)    Intake/Output from previous day: 12/19 0701 - 12/20 0700 In: 1212 [I.V.:1212] Out: 700 [Urine:700] Intake/Output this shift: No intake/output data recorded.  Gen: NAD, comfortable CV: RRR Pulm: Normal work of breathing Abd: Soft, NT/ND; colostomy pink, patent; sweat in appliance. Ext: SCDs in place  Lab Results: CBC  Recent Labs    03/21/18 0312  WBC 14.9*  HGB 11.7*  HCT 37.2  PLT 209   BMET Recent Labs    03/21/18 0312  NA 141  K 5.2*  CL 106  CO2 24  GLUCOSE 177*  BUN 13  CREATININE 0.85  CALCIUM 8.4*   PT/INR No results for input(s): LABPROT, INR in the last 72 hours. ABG No results for input(s): PHART, HCO3 in the last 72 hours.  Invalid input(s): PCO2, PO2  Studies/Results:  Anti-infectives: Anti-infectives (From admission, onward)   Start     Dose/Rate Route Frequency Ordered Stop   03/20/18 1400  neomycin (MYCIFRADIN) tablet 1,000 mg  Status:  Discontinued     1,000 mg Oral 3 times per day 03/20/18 0644 03/20/18 0645   03/20/18 1400  metroNIDAZOLE (FLAGYL) tablet 1,000 mg  Status:  Discontinued     1,000 mg Oral 3 times per day 03/20/18 0644 03/20/18 0645   03/20/18 0645  cefoTEtan (CEFOTAN) 2 g in sodium chloride 0.9 % 100 mL IVPB     2 g 200 mL/hr over 30 Minutes Intravenous On call to O.R. 03/20/18 0644 03/20/18 0840       Assessment/Plan: Patient Active Problem List   Diagnosis Date Noted  . S/P laparoscopic-assisted sigmoidectomy 03/20/2018  . Colonic mass 03/20/2018  . History  of adenomatous polyp of colon   . Polyp of ascending colon   . 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 Orthostatic hypotension 09/24/2017  . Visit for monitoring Tikosyn therapy 07/23/2017  . Depression 06/15/2017  . Acute respiratory failure with hypoxia ([place])   . Hypothyroidism   . Bronchitis 03/22/2016  . Abnormal CT of the chest 03/16/2016  . Coronary artery disease involving coronary bypass graft of native heart with angina pectoris ([place]) 03/16/2016  . Chronic anticoagulation 10/06/2015  . On dofetilide therapy 10/06/2015  . Atrial fibrillation ([place]) 09/15/2015  . Snoring 02/17/2015  . Postoperative examination 12/22/2014  . Chronic low back pain 11/09/2014  . Disc degeneration, lumbar 11/09/2014  . Addison disease ([place]) 09/30/2014  . Chronic back pain 09/30/2014  . Hematochezia-(Hgb stable) 09/03/2014  . DOE (dyspnea on exertion) 09/01/2014  . Lumbar radiculopathy 04/27/2014  . UTI (lower urinary tract infection) 07/09/2011  . Nausea and vomiting 07/08/2011  . Osteoarthritis 11/18/2008  . Hyperlipidemia 11/17/2008  . Essential hypertension 11/17/2008   s/p Procedure(s): LAPAROSCOPIC SIGMOIDECTOMY WITH COLOSTOMY LAPAROSCOPIC TAKE DOWN OF COLOVESICAL FISTULA CYSTOSCOPY WITH BILATERAL RETROGRADE AND BILATERAL URETERAL CATHETER PLACEMENT 03/20/2018  -Recovering well -Ambulate multiple times per day - PT/OT -Medicine following for help with managing comorbidities and Addison's - appreciate assistance in her care -Advance to diabetic diet -Transfer to telemetry today -Continue foley catheter to gravity drainage given  takedown of colovesical fistula - will leave foley in place and plan outpatient cystogram prior to removal -Will plan to hold therapeutic anticoagulation until hgb stable and at least 2d out from surgery - will reassess tomorrow -PPx: SQH, SCDs, home PPI -Dispo: SNF as recommended by PT/OT - SW consult placed   LOS: 2 days   Sharon Mt. Dema Severin, M.D. [facility]

## 2018-03-22 NOTE — Consult Note (Signed)
[place] Nurse ostomy follow up Stoma type/location: LUQ, end colostomy Stomal assessment/size: oval shaped 7/8" x 1 1/2", pink, moist, flush with skin, in skin fold Peristomal assessment: intact, one tiny blister at 6 o'clock about 1cm away from the stoma Treatment options for stomal/peristomal skin: using 2" barrier ring to attempt to flatten surface around stoma Output liquid, seedy brown Ostomy pouching: 1pc.flexible convex used with 2" barrier ring. Belt in the room and I have explained rationale for use and demonstrated hooking belt to skin barrier  Education provided:  Explained role of ostomy nurse and creation of stoma  Explained stoma characteristics (budded, flush, color, texture, care) Demonstrated pouch change (cutting new barrier, measuring stoma, cleaning peristomal skin and stoma, use of barrier ring) Education on use wick in stoma to keep skin dry with pouch change Education on emptying when 1/3 to 1/2 full and how to empty Discussed bathing and her preference for water exercise. She is very pleasant and engaged to learn but gets a little tearful during process. She lives alone and reports to have "no one". She makes a comment "this is just too much for an old lady to learn" I have tried to reinforce that we will work with her for her independence. She is very concerned on the status of rehab and if they will even know how to help her. She makes a statement "I'm ready to go, just be out of here"  I have allowed her to express her feelings and notify the bedside nurse that she may need SW or chaplin services.  She will face difficultly with pouching unless she can learn to do this in front of a mirror, currently she can not really see her stoma well.   Enrolled patient in [facility] Start Discharge program: Yes

## 2018-03-22 NOTE — Progress Notes (Signed)
Pt converted from 1st degree HB to atrial flutter, rate controlled at 70-80. Pt asymptomatic with h/o AF/flutter this admission. Dr. Dema Severin notified. Will continue to monitor.  Barbee [place]. Brigitte Pulse, RN

## 2018-03-22 NOTE — Progress Notes (Signed)
Assumed care of patient at 1500. Agree with previous Nurse assessment. Pt in no distress.  [name]. Brigitte Pulse, RN

## 2018-03-22 NOTE — Discharge Instructions (Signed)
POST OP INSTRUCTIONS AFTER COLON SURGERY  1. DIET: Be sure to include lots of fluids daily to stay hydrated - 64oz of water per day (8, 8 oz glasses).  Avoid fast food or heavy meals for the first couple of weeks as your are more likely to get nauseated. Avoid raw/uncooked fruits or vegetables for the first 4 weeks (its ok to have these if they are blended into smoothie form). If you have fruits/vegetables, make sure they are cooked until soft enough to mash on the roof of your mouth and chew your food well. Otherwise, diet as tolerated.  2. Take your usually prescribed home medications unless otherwise directed.  3. PAIN CONTROL: a. Pain is best controlled by a usual combination of three different methods TOGETHER: i. Ice/Heat ii. Over the counter pain medication iii. Prescription pain medication b. Most patients will experience some swelling and bruising around the surgical site.  Ice packs or heating pads (30-60 minutes up to 6 times a day) will help. Some people prefer to use ice alone, heat alone, alternating between ice & heat.  Experiment to what works for you.  Swelling and bruising can take several weeks to resolve.   c. It is helpful to take an over-the-counter pain medication regularly for the first few weeks: i. Ibuprofen (Motrin/Advil) - 200mg  tabs - take 3 tabs (600mg ) every 6 hours as needed for pain (unless you have been directed previously to avoid NSAIDs/ibuprofen) ii. Acetaminophen (Tylenol) - you may take 650mg  every 6 hours as needed. You can take this with motrin as they act differently on the body. If you are taking a narcotic pain medication that has acetaminophen in it, do not take over the counter tylenol at the same time. iii. NOTE: You may take both of these medications together - most patients  find it most helpful when alternating between the two (i.e. Ibuprofen at 6am, tylenol at 9am, ibuprofen at 12pm ...) d. A  prescription for pain medication should be given to you  upon discharge.  Take your pain medication as prescribed if your pain is not adequatly controlled with the over-the-counter pain reliefs mentioned above.  4. Avoid getting constipated.  Between the surgery and the pain medications, it is common to experience some constipation.  Increasing fluid intake and taking a fiber supplement (such as Metamucil, Citrucel, FiberCon, MiraLax, etc) 1-2 times a day regularly will usually help prevent this problem from occurring.  A mild laxative (prune juice, Milk of Magnesia, MiraLax, etc) should be taken according to package directions if there are no bowel movements after 48 hours.    5. Dressing: Your incisions are covered in Dermabond which is like sterile superglue for the skin. This will come off on it's own in a couple weeks. It is waterproof and you may bathe normally starting the day after your surgery in a shower. Avoid baths/pools/lakes/oceans until your wounds have fully healed. 6. Ostomy care: Continue to manage and change appliance as taught during your inpatient stay here. Our wound ostomy nurses have provided you with additional materials as well as instructions for care. Please refer to these. If there are additional questions, concerns, etc, please let us know. 7. Foley catheter: Empty leg bag 4-5x day into commode. A cystogram has been ordered by our office. We typically leave the catheter in place for ~14 days following this particular operation and study the bladder prior to removal. Our office will be touching base with you about instructions on next steps.  8.  ACTIVITIES as tolerated:   a. Avoid heavy lifting (>10lbs or 1 gallon of milk) for the next 8 weeks. b. You may resume regular daily activities as tolerated--such as daily self-care, walking, climbing stairs--gradually increasing activities as tolerated.  If you can walk 30 minutes without difficulty, it is safe to try more intense activity such as jogging, treadmill, bicycling, low-impact  aerobics.  c. DO NOT PUSH THROUGH PAIN.  Let pain be your guide: If it hurts to do something, don't do it. d. Dennis Bast may drive when you are no longer taking prescription pain medication, you can comfortably wear a seatbelt, and you can safely maneuver your car and apply brakes.   9. FOLLOW UP in our office a. Please call [facility] at [phone] to set up an appointment to see your surgeon in the office for a follow-up appointment approximately 2 weeks after your surgery. b. Make sure that you call for this appointment the day you arrive home to insure a convenient appointment time.  9. If you have disability or family leave forms that need to be completed, you may have them completed by your primary care physician's office; for return to work instructions, please ask our office staff and they will be happy to assist you in obtaining this documentation   When to call us [phone]: 1. Poor pain control 2. Reactions / problems with new medications (rash/itching, etc)  3. Fever over 101.5 F (38.5 C) 4. Inability to urinate 5. Nausea/vomiting 6. Worsening swelling or bruising 7. Continued bleeding from incision. 8. Increased pain, redness, or drainage from the incision  The clinic staff is available to answer your questions during regular business hours (8:30am-5pm).  Please dont hesitate to call and ask to speak to one of our nurses for clinical concerns.   A surgeon from [facility] is always on call at the hospitals   If you have a medical emergency, go to the nearest emergency room or call 911.  [facility], [place] [address] [place], [place], [place]  [address] MAIN: [phone] FAX: [phone] [email or web address]

## 2018-03-22 NOTE — Care Management Note (Signed)
Case Management Note  Patient Details  Name: [name] MRN: [id number] Date of Birth: Feb 09, 1941  Subjective/Objective:                   Discharge readiness is indicated by patient meeting Recovery Milestones, including ALL of the following: ? Hemodynamic stability ? No evidence of ileus or bowel obstruction- colostomy now functioning in samll amount [id number] ? Ambulatory[K] with assistance ? No evidence of postoperative or surgical site infection ? Pain absent or managed managed ? Oral hydration, medications, and diet 122019-Advance to diabetic diet/ iv flds=l.r. at 100cc/hr Transferring to tele. Floor [phone]  Action/Plan: snf planned for dc-md -csw referral.  Expected Discharge Date:                  Expected Discharge Plan:  [place]  In-House Referral:     Discharge planning Services  CM Consult  Post Acute Care Choice:    Choice offered to:  Patient  DME Arranged:    DME Agency:     HH Arranged:    [place] Agency:     Status of Service:  In process, will continue to follow  If discussed at Long Length of Stay Meetings, dates discussed:    Additional Comments:  [name], RN 03/22/2018, 9:52 AM

## 2018-03-23 LAB — GLUCOSE, CAPILLARY
Glucose-Capillary: 131 mg/dL — ABNORMAL HIGH (ref 70–99)
Glucose-Capillary: 97 mg/dL (ref 70–99)
Glucose-Capillary: 203 mg/dL — ABNORMAL HIGH (ref 70–99)
Glucose-Capillary: 145 mg/dL — ABNORMAL HIGH (ref 70–99)

## 2018-03-23 LAB — BASIC METABOLIC PANEL
Anion gap: 6 (ref 5–15)
BUN: 17 mg/dL (ref 8–23)
CO2: 30 mmol/L (ref 22–32)
Calcium: 8.6 mg/dL — ABNORMAL LOW (ref 8.9–10.3)
Chloride: 106 mmol/L (ref 98–111)
Creatinine, Ser: 0.83 mg/dL (ref 0.44–1.00)
GFR calc Af Amer: >60 mL/min (ref >60)
GFR calc non Af Amer: >60 mL/min (ref >60)
GLUCOSE: 136 mg/dL — AB (ref 70–99)
Potassium: 4.1 mmol/L (ref 3.5–5.1)
Sodium: 142 mmol/L (ref 135–145)

## 2018-03-23 LAB — CBC
HEMATOCRIT: 33.1 % — AB (ref 36.0–46.0)
Hemoglobin: 10.4 g/dL — ABNORMAL LOW (ref 12.0–15.0)
MCH: 30.5 pg (ref 26.0–34.0)
MCHC: 31.4 g/dL (ref 30.0–36.0)
MCV: 97.1 fL (ref 80.0–100.0)
PLATELETS: 193 10*3/uL (ref 150–400)
RBC: 3.41 MIL/uL — ABNORMAL LOW (ref 3.87–5.11)
RDW: 13.2 % (ref 11.5–15.5)
WBC: 10.6 10*3/uL — ABNORMAL HIGH (ref 4.0–10.5)
nRBC: 0 % (ref 0.0–0.2)

## 2018-03-23 LAB — GROUP A STREP BY PCR: Group A Strep by PCR: NOT DETECTED

## 2018-03-23 LAB — MAGNESIUM: Magnesium: 2 mg/dL (ref 1.7–2.4)

## 2018-03-23 LAB — PHOSPHORUS: Phosphorus: 3.3 mg/dL (ref 2.5–4.6)

## 2018-03-23 MED ORDER — PHENOL 1.4 % MT LIQD
1.0000 | OROMUCOSAL | Status: DC | PRN
  Administered 2018-03-23: 1 via OROMUCOSAL
  Filled 2018-03-23: qty 177

## 2018-03-23 MED ORDER — FLUCONAZOLE IN SODIUM CHLORIDE 200-0.9 MG/100ML-% IV SOLN
200.0000 mg | Freq: Once | INTRAVENOUS | Status: AC
  Administered 2018-03-23: 200 mg via INTRAVENOUS
  Filled 2018-03-23: qty 100

## 2018-03-23 MED ORDER — FLUCONAZOLE 100MG IVPB
100.0000 mg | INTRAVENOUS | Status: DC
  Administered 2018-03-24: 100 mg via INTRAVENOUS
  Filled 2018-03-23: qty 50

## 2018-03-23 NOTE — Progress Notes (Signed)
Patient ID: [name], female   DOB: 1940/04/17, 77 y.o.   MRN: [id number] [facility] Progress Note:   3 Days Post-Op  Subjective: Mental status is clear.  Wanting to get up and walk with assistance Objective: Vital signs in last 24 hours: Temp:  [98 F (36.7 C)-98.7 F (37.1 C)] 98 F (36.7 C) (12/21 0444) Pulse Rate:  [33-76] 58 (12/21 0444) Resp:  [15-22] 20 (12/21 0444) BP: (121-159)/(41-94) 156/94 (12/21 0444) SpO2:  [83 %-97 %] 94 % (12/21 0444) Weight:  [93.5 kg] 93.5 kg (12/20 1445)  Intake/Output from previous day: 12/20 0701 - 12/21 0700 In: 360 [P.O.:360] Out: 1435 [Urine:1425; Stool:10] Intake/Output this shift: No intake/output data recorded.  Physical Exam: Work of breathing is normal.  Ostomy OK; Foley catheter in place  Lab Results:  Results for orders placed or performed during the hospital encounter of 03/20/18 (from the past 48 hour(s))  Glucose, capillary     Status: Abnormal   Collection Time: 03/21/18 12:40 PM  Result Value Ref Range   Glucose-Capillary 180 (H) 70 - 99 mg/dL   Comment 1 Notify RN    Comment 2 Document in Chart   Glucose, capillary     Status: Abnormal   Collection Time: 03/21/18  4:25 PM  Result Value Ref Range   Glucose-Capillary 196 (H) 70 - 99 mg/dL   Comment 1 Notify RN    Comment 2 Document in Chart   Glucose, capillary     Status: Abnormal   Collection Time: 03/21/18 10:17 PM  Result Value Ref Range   Glucose-Capillary 172 (H) 70 - 99 mg/dL  Glucose, capillary     Status: Abnormal   Collection Time: 03/22/18  8:20 AM  Result Value Ref Range   Glucose-Capillary 142 (H) 70 - 99 mg/dL  CBC     Status: Abnormal   Collection Time: 03/22/18  8:40 AM  Result Value Ref Range   WBC 13.8 (H) 4.0 - 10.5 K/uL   RBC 3.63 (L) 3.87 - 5.11 MIL/uL   Hemoglobin 11.2 (L) 12.0 - 15.0 g/dL   HCT 35.2 (L) 36.0 - 46.0 %   MCV 97.0 80.0 - 100.0 fL   MCH 30.9 26.0 - 34.0 pg   MCHC 31.8 30.0 - 36.0 g/dL   RDW 13.2 11.5 -  15.5 %   Platelets 217 150 - 400 K/uL   nRBC 0.0 0.0 - 0.2 %    Comment: Performed at [facility], [place] [address], [place], [place] [address]  Basic metabolic panel     Status: Abnormal   Collection Time: 03/22/18  8:40 AM  Result Value Ref Range   Sodium 137 135 - 145 mmol/L   Potassium 4.2 3.5 - 5.1 mmol/L    Comment: DELTA CHECK NOTED REPEATED TO VERIFY    Chloride 103 98 - 111 mmol/L   CO2 27 22 - 32 mmol/L   Glucose, Bld 138 (H) 70 - 99 mg/dL   BUN 18 8 - 23 mg/dL   Creatinine, Ser 0.94 0.44 - 1.00 mg/dL   Calcium 8.5 (L) 8.9 - 10.3 mg/dL   GFR calc non Af Amer 58 (L) >60 mL/min   GFR calc Af Amer >60 >60 mL/min   Anion gap 7 5 - 15    Comment: Performed at [facility], [place] [address] [place], [place] [address]  Phosphorus     Status: None   Collection Time: 03/22/18  8:40 AM  Result Value Ref  Range   Phosphorus 2.9 2.5 - 4.6 mg/dL    Comment: Performed at [facility], [place] [address], [place], [place] [address]  Magnesium     Status: None   Collection Time: 03/22/18  8:40 AM  Result Value Ref Range   Magnesium 2.2 1.7 - 2.4 mg/dL    Comment: Performed at [facility], [place] [address], [place], [place] [address]  Glucose, capillary     Status: Abnormal   Collection Time: 03/22/18 12:02 PM  Result Value Ref Range   Glucose-Capillary 155 (H) 70 - 99 mg/dL  Glucose, capillary     Status: Abnormal   Collection Time: 03/22/18  4:12 PM  Result Value Ref Range   Glucose-Capillary 224 (H) 70 - 99 mg/dL  Glucose, capillary     Status: Abnormal   Collection Time: 03/22/18  8:45 PM  Result Value Ref Range   Glucose-Capillary 154 (H) 70 - 99 mg/dL  CBC     Status: Abnormal   Collection Time: 03/23/18  5:54 AM  Result Value Ref Range   WBC 10.6 (H) 4.0 - 10.5 K/uL   RBC 3.41 (L) 3.87 - 5.11 MIL/uL   Hemoglobin 10.4 (L) 12.0 - 15.0 g/dL   HCT 33.1 (L) 36.0 - 46.0 %   MCV 97.1 80.0 - 100.0 fL   MCH  30.5 26.0 - 34.0 pg   MCHC 31.4 30.0 - 36.0 g/dL   RDW 13.2 11.5 - 15.5 %   Platelets 193 150 - 400 K/uL   nRBC 0.0 0.0 - 0.2 %    Comment: Performed at [facility], [place] [address], [place], [place] [address]  Basic metabolic panel     Status: Abnormal   Collection Time: 03/23/18  5:54 AM  Result Value Ref Range   Sodium 142 135 - 145 mmol/L   Potassium 4.1 3.5 - 5.1 mmol/L   Chloride 106 98 - 111 mmol/L   CO2 30 22 - 32 mmol/L   Glucose, Bld 136 (H) 70 - 99 mg/dL   BUN 17 8 - 23 mg/dL   Creatinine, Ser 0.83 0.44 - 1.00 mg/dL   Calcium 8.6 (L) 8.9 - 10.3 mg/dL   GFR calc non Af Amer >60 >60 mL/min   GFR calc Af Amer >60 >60 mL/min   Anion gap 6 5 - 15    Comment: Performed at [facility], [place] [address] [place], [place] [address]  Phosphorus     Status: None   Collection Time: 03/23/18  5:54 AM  Result Value Ref Range   Phosphorus 3.3 2.5 - 4.6 mg/dL    Comment: Performed at [facility], [place] [address], [place], [place] [address]  Magnesium     Status: None   Collection Time: 03/23/18  5:54 AM  Result Value Ref Range   Magnesium 2.0 1.7 - 2.4 mg/dL    Comment: Performed at [facility], [place] [address], [place], [place] [address]  Glucose, capillary     Status: Abnormal   Collection Time: 03/23/18  7:16 AM  Result Value Ref Range   Glucose-Capillary 131 (H) 70 - 99 mg/dL    Radiology/Results: No results found.  Anti-infectives: Anti-infectives (From admission, onward)   Start     Dose/Rate Route Frequency Ordered Stop   03/24/18 1000  fluconazole (DIFLUCAN) IVPB 100 mg     100 mg 50 mL/hr over 60 Minutes Intravenous Every 24 hours 03/23/18 0859  03/23/18 1000  fluconazole (DIFLUCAN) IVPB 200 mg     200 mg 100 mL/hr over 60 Minutes Intravenous Once 03/23/18 0859     03/20/18 1400  neomycin (MYCIFRADIN) tablet 1,000 mg  Status:  Discontinued     1,000 mg Oral 3 times per day 03/20/18 0644  03/20/18 0645   03/20/18 1400  metroNIDAZOLE (FLAGYL) tablet 1,000 mg  Status:  Discontinued     1,000 mg Oral 3 times per day 03/20/18 0644 03/20/18 0645   03/20/18 0645  cefoTEtan (CEFOTAN) 2 g in sodium chloride 0.9 % 100 mL IVPB     2 g 200 mL/hr over 30 Minutes Intravenous On call to O.R. 03/20/18 0644 03/20/18 0840      Assessment/Plan: Problem List: Patient Active Problem List   Diagnosis Date Noted  . S/P laparoscopic-assisted sigmoidectomy 03/20/2018  . Colonic mass 03/20/2018  . History of adenomatous polyp of colon   . Polyp of ascending colon   . 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 Orthostatic hypotension 09/24/2017  . Visit for monitoring Tikosyn therapy 07/23/2017  . Depression 06/15/2017  . Acute respiratory failure with hypoxia ([place])   . Hypothyroidism   . Bronchitis 03/22/2016  . Abnormal CT of the chest 03/16/2016  . Coronary artery disease involving coronary bypass graft of native heart with angina pectoris ([place]) 03/16/2016  . Chronic anticoagulation 10/06/2015  . On dofetilide therapy 10/06/2015  . Atrial fibrillation ([place]) 09/15/2015  . Snoring 02/17/2015  . Postoperative examination 12/22/2014  . Chronic low back pain 11/09/2014  . Disc degeneration, lumbar 11/09/2014  . Addison disease ([place]) 09/30/2014  . Chronic back pain 09/30/2014  . Hematochezia-(Hgb stable) 09/03/2014  . DOE (dyspnea on exertion) 09/01/2014  . Lumbar radiculopathy 04/27/2014  . UTI (lower urinary tract infection) 07/09/2011  . Nausea and vomiting 07/08/2011  . Osteoarthritis 11/18/2008  . Hyperlipidemia 11/17/2008  . Essential hypertension 11/17/2008    Will need SNF post discharge.  Getting along better.  3 Days Post-Op    LOS: 3 days   Matt B. Hassell Done, MD, [facility] [phone] beeper [phone]  03/23/2018 9:43 AM

## 2018-03-23 NOTE — Progress Notes (Signed)
[facility] PROGRESS NOTE    Progress Note  [name]  PPJ:[id number] DOB: 1940/10/29 DOA: 03/20/2018 PCP: Crist Infante, MD     Brief Narrative:   MALEE Scott is an 77 y.o. 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  She also had bilateral stent placement with cystoscopy.  Assessment/Plan:   Active Problems:   S/P laparoscopic-assisted sigmoidectomy   Colonic mass Pain management and further management per surgery.  Addison's disease: Blood pressure seems to be stable, with a heart rate in the 60s Will continue current dose of steroids at her home dose.  Essential hypertension: Continue Cardizem, will continue to monitor blood pressure.  Hypothyroidism: Continue Synthroid.  Depression and anxiety: Continue fluoxetine gabapentin and clonazepam.  Chronic atrial fibrillation: Rate controlled on diltiazem and Dofetilide. Keep magnesium greater than 2 potassium greater than 4. When to resume Eliquis to be determined by surgery. Check a 12-lead EKG today and tomorrow keep her on telemetry. As Diflucan and the Deftilide can increase her QTC  CAD status post CABG: Continue aspirin and nitroglycerin as needed.  Chronic pain/lumbar radiculopathy: Has a back stimulator. No further pain, will continue to follow along with you.  Dysphagia likely due to oral thrush: We will start her on IV Diflucan check is rapid strep throat. Remain afebrile with no leukocytosis. Give her Chloraseptic spray.  DVT prophylaxis: lovenxo Family Communication:son Disposition Plan/Barrier to D/C: per surgery Code Status:     Code  Status Orders  (From admission, onward)         Start     Ordered   03/20/18 1420  Full code  Continuous     03/20/18 1420        Code Status History    Date Active Date Inactive Code Status Order ID Comments User Context   09/24/2017 1707 09/24/2017 1803 DNR [id number]  Karmen Bongo, MD ED   07/23/2017 1234 07/27/2017 1656 Full Code [id number]  Baldwin [place], PA-C Inpatient   07/03/2017 1822 07/06/2017 1541 Full Code [id number]  Daune Perch, NP Inpatient   03/22/2016 1335 03/25/2016 1604 Full Code [id number]  Brenton Grills, PA-C ED   09/02/2014 1636 09/03/2014 1437 Full Code [id number]  Wellington [place], MD Inpatient   09/01/2014 2110 09/02/2014 1636 Full Code [id number]  Consuelo Pandy, PA-C Inpatient   07/09/2011 0041 07/09/2011 2126 Full Code [id number]  Dalbert Mayotte, LPN Inpatient   07/10/1446 1243 06/28/2011 1302 Full Code [id number]  Signe Colt, RN Inpatient    Advance Directive Documentation     Most Recent Value  Type of Advance Directive  Healthcare Power of Attorney, Living will  Pre-existing out of facility DNR order (yellow form or pink MOST form)  -  "MOST" Form in Place?  -        IV Access:    Peripheral IV   Procedures and diagnostic studies:   No results found.   Medical Consultants:    None.  Anti-Infectives:   Cephoteetan  Subjective:    [name] she relates she feels great no dizziness upon standing tolerating her fluids but having some dysphagia.  Objective:    Vitals:   03/22/18 1400 03/22/18 1445 03/22/18 2046 03/23/18 0444  BP: (!) 136/57  (!) 121/57 (!) 156/94  Pulse: 76  61 (!) 58  Resp: 15  20 20   Temp:   98.7 F (37.1 C) 98 F (36.7 C)  TempSrc:   Oral Oral  SpO2: (!) 83%  97% 94%  Weight:  93.5 kg    Height:  5' 4.5" (1.638 m)      Intake/Output Summary (Last 24 hours) at 03/23/2018 0857 Last data filed at 03/23/2018 0541 Gross per 24 hour  Intake 360 ml  Output 1435 ml  Net -1075 ml    Filed Weights   03/21/18 0500 03/22/18 0500 03/22/18 1445  Weight: 93.7 kg 94.6 kg 93.5 kg    Exam: General exam: In no acute distress, she seems to have oral thrush Respiratory system: Good air movement and clear to auscultation. Cardiovascular system: S1 & S2 heard, RRR. Gastrointestinal system: Abdomen is nondistended, soft and nontender.  Central nervous system: Alert and oriented. No focal neurological deficits. Extremities: No pedal edema. Skin: No rashes, lesions or ulcers Psychiatry: Judgement and insight appear normal. Mood & affect appropriate.    Data Reviewed:    Labs: Basic Metabolic Panel: Recent Labs  Lab 03/21/18 0312 03/22/18 0840  NA 141 137  K 5.2* 4.2  CL 106 103  CO2 24 27  GLUCOSE 177* 138*  BUN 13 18  CREATININE 0.85 0.94  CALCIUM 8.4* 8.5*  MG 2.0 2.2  PHOS 4.0 2.9   GFR Estimated Creatinine Clearance: 55.2 mL/min (by C-G formula based on SCr of 0.94 mg/dL). Liver Function Tests: No results for input(s): AST, ALT, ALKPHOS, BILITOT, PROT, ALBUMIN in the last 168 hours. No results for input(s): LIPASE, AMYLASE in the last 168 hours. No results for input(s): AMMONIA in the last 168 hours. Coagulation profile No results for input(s): INR, PROTIME in the last 168 hours.  CBC: Recent Labs  Lab 03/21/18 0312 03/22/18 0840 03/23/18 0554  WBC 14.9* 13.8* 10.6*  HGB 11.7* 11.2* 10.4*  HCT 37.2 35.2* 33.1*  MCV 96.9 97.0 97.1  PLT 209 217 193   Cardiac Enzymes: No results for input(s): CKTOTAL, CKMB, CKMBINDEX, TROPONINI in the last 168 hours. BNP (last 3 results) No results for input(s): PROBNP in the last 8760 hours. CBG: Recent Labs  Lab 03/22/18 0820 03/22/18 1202 03/22/18 1612 03/22/18 2045 03/23/18 0716  GLUCAP 142* 155* 224* 154* 131*   D-Dimer: No results for input(s): DDIMER in the last 72 hours. Hgb A1c: No results for input(s): HGBA1C in the last 72 hours. Lipid Profile: No results for input(s): CHOL, HDL,  LDLCALC, TRIG, CHOLHDL, LDLDIRECT in the last 72 hours. Thyroid function studies: No results for input(s): TSH, T4TOTAL, T3FREE, THYROIDAB in the last 72 hours.  Invalid input(s): FREET3 Anemia work up: No results for input(s): VITAMINB12, FOLATE, FERRITIN, TIBC, IRON, RETICCTPCT in the last 72 hours. Sepsis Labs: Recent Labs  Lab 03/21/18 0312 03/22/18 0840 03/23/18 0554  WBC 14.9* 13.8* 10.6*   Microbiology Recent Results (from the past 240 hour(s))  MRSA PCR Screening     Status: None   Collection Time: 03/20/18  2:21 PM  Result Value Ref Range Status   MRSA by PCR NEGATIVE NEGATIVE Final    Comment:        The GeneXpert MRSA Assay (FDA approved for NASAL specimens only), is one component of a comprehensive MRSA colonization surveillance program. It is not intended to diagnose MRSA infection nor to guide or monitor treatment for MRSA infections. Performed at [facility],  [place] [address], [place], [place] [address]      Medications:   . acetaminophen  1,000 mg Oral Q6H  . apixaban  5 mg Oral BID  . diltiazem  180 mg Oral QHS  . dofetilide  125 mcg Oral BID  . feeding supplement  237 mL Oral BID BM  . FLUoxetine  10 mg Oral q morning - 10a  . hydrocortisone  20 mg Oral BID  . insulin aspart  0-15 Units Subcutaneous TID WC  . insulin aspart  0-5 Units Subcutaneous QHS  . irbesartan  37.5 mg Oral Daily  . levothyroxine  88 mcg Oral QAC breakfast  . mouth rinse  15 mL Mouth Rinse BID  . pantoprazole  40 mg Oral Daily  . rOPINIRole  0.5 mg Oral QHS   Continuous Infusions:    LOS: 3 days   Charlynne Cousins  [facility]   *Please refer to [email or web address], password [email or web address] to get updated schedule on who will round on this patient, as hospitalists switch teams weekly. If 7PM-7AM, please contact night-coverage at [email or web address], password [email or web address] for any overnight needs.  03/23/2018, 8:57 AM

## 2018-03-24 LAB — BASIC METABOLIC PANEL
Anion gap: 10 (ref 5–15)
Anion gap: 6 (ref 5–15)
BUN: 20 mg/dL (ref 8–23)
BUN: 19 mg/dL (ref 8–23)
CO2: 27 mmol/L (ref 22–32)
CO2: 29 mmol/L (ref 22–32)
Calcium: 8.5 mg/dL — ABNORMAL LOW (ref 8.9–10.3)
Calcium: 8.2 mg/dL — ABNORMAL LOW (ref 8.9–10.3)
Chloride: 101 mmol/L (ref 98–111)
Chloride: 104 mmol/L (ref 98–111)
Creatinine, Ser: 0.75 mg/dL (ref 0.44–1.00)
Creatinine, Ser: 0.9 mg/dL (ref 0.44–1.00)
GFR calc Af Amer: >60 mL/min (ref >60)
GFR calc Af Amer: >60 mL/min (ref >60)
GFR calc non Af Amer: >60 mL/min (ref >60)
GFR calc non Af Amer: >60 mL/min (ref >60)
Glucose, Bld: 173 mg/dL — ABNORMAL HIGH (ref 70–99)
Glucose, Bld: 140 mg/dL — ABNORMAL HIGH (ref 70–99)
Potassium: 3.6 mmol/L (ref 3.5–5.1)
Potassium: 3.6 mmol/L (ref 3.5–5.1)
Sodium: 138 mmol/L (ref 135–145)
Sodium: 139 mmol/L (ref 135–145)

## 2018-03-24 LAB — CBC
HCT: 34.1 % — ABNORMAL LOW (ref 36.0–46.0)
HEMATOCRIT: 36.4 % (ref 36.0–46.0)
HEMOGLOBIN: 10.9 g/dL — AB (ref 12.0–15.0)
Hemoglobin: 11.8 g/dL — ABNORMAL LOW (ref 12.0–15.0)
MCH: 30.8 pg (ref 26.0–34.0)
MCH: 31.1 pg (ref 26.0–34.0)
MCHC: 32 g/dL (ref 30.0–36.0)
MCHC: 32.4 g/dL (ref 30.0–36.0)
MCV: 96.3 fL (ref 80.0–100.0)
MCV: 95.8 fL (ref 80.0–100.0)
NRBC: 0 % (ref 0.0–0.2)
Platelets: 201 10*3/uL (ref 150–400)
Platelets: 219 10*3/uL (ref 150–400)
RBC: 3.54 MIL/uL — ABNORMAL LOW (ref 3.87–5.11)
RBC: 3.8 MIL/uL — ABNORMAL LOW (ref 3.87–5.11)
RDW: 12.9 % (ref 11.5–15.5)
RDW: 13.1 % (ref 11.5–15.5)
WBC: 8 10*3/uL (ref 4.0–10.5)
WBC: 9.8 10*3/uL (ref 4.0–10.5)
nRBC: 0 % (ref 0.0–0.2)

## 2018-03-24 LAB — MAGNESIUM
Magnesium: 1.7 mg/dL (ref 1.7–2.4)
Magnesium: 1.8 mg/dL (ref 1.7–2.4)

## 2018-03-24 LAB — GLUCOSE, CAPILLARY
Glucose-Capillary: 111 mg/dL — ABNORMAL HIGH (ref 70–99)
Glucose-Capillary: 113 mg/dL — ABNORMAL HIGH (ref 70–99)
Glucose-Capillary: 159 mg/dL — ABNORMAL HIGH (ref 70–99)
Glucose-Capillary: 128 mg/dL — ABNORMAL HIGH (ref 70–99)

## 2018-03-24 LAB — PHOSPHORUS: Phosphorus: 3.6 mg/dL (ref 2.5–4.6)

## 2018-03-24 MED ORDER — MAGNESIUM OXIDE 400 (241.3 MG) MG PO TABS
400.0000 mg | ORAL_TABLET | Freq: Once | ORAL | Status: AC
  Administered 2018-03-24: 400 mg via ORAL
  Filled 2018-03-24: qty 1

## 2018-03-24 MED ORDER — POTASSIUM CHLORIDE CRYS ER 20 MEQ PO TBCR
40.0000 meq | EXTENDED_RELEASE_TABLET | Freq: Two times a day (BID) | ORAL | Status: AC
  Administered 2018-03-24 (×2): 40 meq via ORAL
  Filled 2018-03-24 (×2): qty 2

## 2018-03-24 MED ORDER — CLONAZEPAM 0.5 MG PO TABS
0.2500 mg | ORAL_TABLET | Freq: Two times a day (BID) | ORAL | Status: DC | PRN
  Administered 2018-03-24: 0.25 mg via ORAL
  Filled 2018-03-24: qty 1

## 2018-03-24 MED ORDER — HYDROCODONE-ACETAMINOPHEN 5-325 MG PO TABS
1.0000 | ORAL_TABLET | ORAL | Status: DC | PRN
  Administered 2018-03-29 – 2018-03-31 (×2): 1 via ORAL
  Filled 2018-03-24 (×3): qty 1

## 2018-03-24 MED ORDER — NYSTATIN 100000 UNIT/ML MT SUSP
5.0000 mL | Freq: Four times a day (QID) | OROMUCOSAL | Status: DC
  Administered 2018-03-24 – 2018-04-02 (×33): 500000 [IU] via ORAL
  Filled 2018-03-24 (×35): qty 5

## 2018-03-24 MED ORDER — DIAZEPAM 2 MG PO TABS
2.0000 mg | ORAL_TABLET | Freq: Two times a day (BID) | ORAL | Status: DC | PRN
  Administered 2018-03-24 – 2018-03-27 (×5): 2 mg via ORAL
  Filled 2018-03-24 (×5): qty 1

## 2018-03-24 MED ORDER — MAGNESIUM OXIDE 400 (241.3 MG) MG PO TABS
400.0000 mg | ORAL_TABLET | Freq: Two times a day (BID) | ORAL | Status: AC
  Administered 2018-03-24 (×2): 400 mg via ORAL
  Filled 2018-03-24 (×2): qty 1

## 2018-03-24 MED ORDER — FLUCONAZOLE IN SODIUM CHLORIDE 200-0.9 MG/100ML-% IV SOLN
200.0000 mg | INTRAVENOUS | Status: DC
  Filled 2018-03-24: qty 100

## 2018-03-24 NOTE — Progress Notes (Signed)
Patient ID: [name], female   DOB: [birth year], 77 y.o.   MRN: [id number] [facility] Surgery Progress Note:   4 Days Post-Op  Subjective: Mental status is clear.  She is feeling better today.  Anticipating discharge to SNF Objective: Vital signs in last 24 hours: Temp:  [97.7 F (36.5 C)-98.6 F (37 C)] 97.7 F (36.5 C) (12/22 0620) Pulse Rate:  [62-66] 66 (12/22 0620) Resp:  [14-20] 14 (12/22 0620) BP: (140-149)/(67-77) 140/77 (12/22 0620) SpO2:  [96 %-97 %] 96 % (12/22 0620) Weight:  [93.9 kg] 93.9 kg (12/22 0650)  Intake/Output from previous day: 12/21 0701 - 12/22 0700 In: 580 [P.O.:480; IV Piggyback:100] Out: 2950 [Urine:2950] Intake/Output this shift: No intake/output data recorded.  Physical Exam: Work of breathing is normal;  Catheter is bothering her.    Lab Results:  Results for orders placed or performed during the hospital encounter of 03/20/18 (from the past 48 hour(s))  Glucose, capillary     Status: Abnormal   Collection Time: 03/22/18 12:02 PM  Result Value Ref Range   Glucose-Capillary 155 (H) 70 - 99 mg/dL  Glucose, capillary     Status: Abnormal   Collection Time: 03/22/18  4:12 PM  Result Value Ref Range   Glucose-Capillary 224 (H) 70 - 99 mg/dL  Glucose, capillary     Status: Abnormal   Collection Time: 03/22/18  8:45 PM  Result Value Ref Range   Glucose-Capillary 154 (H) 70 - 99 mg/dL  CBC     Status: Abnormal   Collection Time: 03/23/18  5:54 AM  Result Value Ref Range   WBC 10.6 (H) 4.0 - 10.5 K/uL   RBC 3.41 (L) 3.87 - 5.11 MIL/uL   Hemoglobin 10.4 (L) 12.0 - 15.0 g/dL   HCT 33.1 (L) 36.0 - 46.0 %   MCV 97.1 80.0 - 100.0 fL   MCH 30.5 26.0 - 34.0 pg   MCHC 31.4 30.0 - 36.0 g/dL   RDW 13.2 11.5 - 15.5 %   Platelets 193 150 - 400 K/uL   nRBC 0.0 0.0 - 0.2 %    Comment: Performed at [facility], [place] [address], [place], [place] [address]  Basic metabolic panel     Status: Abnormal   Collection Time: 03/23/18   5:54 AM  Result Value Ref Range   Sodium 142 135 - 145 mmol/L   Potassium 4.1 3.5 - 5.1 mmol/L   Chloride 106 98 - 111 mmol/L   CO2 30 22 - 32 mmol/L   Glucose, Bld 136 (H) 70 - 99 mg/dL   BUN 17 8 - 23 mg/dL   Creatinine, Ser 0.83 0.44 - 1.00 mg/dL   Calcium 8.6 (L) 8.9 - 10.3 mg/dL   GFR calc non Af Amer >60 >60 mL/min   GFR calc Af Amer >60 >60 mL/min   Anion gap 6 5 - 15    Comment: Performed at [facility], [place] [address], [place], [place] [address]  Phosphorus     Status: None   Collection Time: 03/23/18  5:54 AM  Result Value Ref Range   Phosphorus 3.3 2.5 - 4.6 mg/dL    Comment: Performed at [facility], [place] [address] [place],  [address]  Magnesium     Status: None   Collection Time: 03/23/18  5:54 AM  Result Value Ref Range   Magnesium 2.0 1.7 - 2.4 mg/dL    Comment: Performed at [facility], [address]  [place], [place] [address]  Glucose, capillary     Status: Abnormal   Collection Time: 03/23/18  7:16 AM  Result Value Ref Range   Glucose-Capillary 131 (H) 70 - 99 mg/dL  Group A Strep by PCR     Status: None   Collection Time: 03/23/18 11:36 AM  Result Value Ref Range   Group A Strep by PCR NOT DETECTED NOT DETECTED    Comment: Performed at [facility], [place] [address], [place], [place] [address]  Glucose, capillary     Status: None   Collection Time: 03/23/18 11:52 AM  Result Value Ref Range   Glucose-Capillary 97 70 - 99 mg/dL  Glucose, capillary     Status: Abnormal   Collection Time: 03/23/18  4:25 PM  Result Value Ref Range   Glucose-Capillary 203 (H) 70 - 99 mg/dL  Glucose, capillary     Status: Abnormal   Collection Time: 03/23/18  9:13 PM  Result Value Ref Range   Glucose-Capillary 145 (H) 70 - 99 mg/dL   Comment 1 Notify RN   CBC     Status: Abnormal   Collection Time: 03/24/18  5:11 AM  Result Value Ref Range   WBC 8.0 4.0 - 10.5 K/uL   RBC 3.54 (L) 3.87 -  5.11 MIL/uL   Hemoglobin 10.9 (L) 12.0 - 15.0 g/dL   HCT 34.1 (L) 36.0 - 46.0 %   MCV 96.3 80.0 - 100.0 fL   MCH 30.8 26.0 - 34.0 pg   MCHC 32.0 30.0 - 36.0 g/dL   RDW 12.9 11.5 - 15.5 %   Platelets 201 150 - 400 K/uL   nRBC 0.0 0.0 - 0.2 %    Comment: Performed at [facility], [place] [address] [place], [place] [address]  Glucose, capillary     Status: Abnormal   Collection Time: 03/24/18  7:22 AM  Result Value Ref Range   Glucose-Capillary 111 (H) 70 - 99 mg/dL    Radiology/Results: No results found.  Anti-infectives: Anti-infectives (From admission, onward)   Start     Dose/Rate Route Frequency Ordered Stop   03/24/18 1000  fluconazole (DIFLUCAN) IVPB 100 mg     100 mg 50 mL/hr over 60 Minutes Intravenous Every 24 hours 03/23/18 0859     03/23/18 1000  fluconazole (DIFLUCAN) IVPB 200 mg     200 mg 100 mL/hr over 60 Minutes Intravenous Once 03/23/18 0859 03/23/18 1242   03/20/18 1400  neomycin (MYCIFRADIN) tablet 1,000 mg  Status:  Discontinued     1,000 mg Oral 3 times per day 03/20/18 0644 03/20/18 0645   03/20/18 1400  metroNIDAZOLE (FLAGYL) tablet 1,000 mg  Status:  Discontinued     1,000 mg Oral 3 times per day 03/20/18 0644 03/20/18 0645   03/20/18 0645  cefoTEtan (CEFOTAN) 2 g in sodium chloride 0.9 % 100 mL IVPB     2 g 200 mL/hr over 30 Minutes Intravenous On call to O.R. 03/20/18 0644 03/20/18 0840      Assessment/Plan: Problem List: Patient Active Problem List   Diagnosis Date Noted  . S/P laparoscopic-assisted sigmoidectomy 03/20/2018  . Colonic mass 03/20/2018  . History of adenomatous polyp of colon   . Polyp of ascending colon   . 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 Orthostatic hypotension 09/24/2017  . Visit for monitoring Tikosyn therapy 07/23/2017  . Depression 06/15/2017  . Acute respiratory failure with hypoxia ([place])   . Hypothyroidism   . Bronchitis 03/22/2016  . Abnormal CT  of the chest 03/16/2016  . Coronary artery disease  involving coronary bypass graft of native heart with angina pectoris ([place]) 03/16/2016  . Chronic anticoagulation 10/06/2015  . On dofetilide therapy 10/06/2015  . Atrial fibrillation ([place]) 09/15/2015  . Snoring 02/17/2015  . Postoperative examination 12/22/2014  . Chronic low back pain 11/09/2014  . Disc degeneration, lumbar 11/09/2014  . Addison disease ([place]) 09/30/2014  . Chronic back pain 09/30/2014  . Hematochezia-(Hgb stable) 09/03/2014  . DOE (dyspnea on exertion) 09/01/2014  . Lumbar radiculopathy 04/27/2014  . UTI (lower urinary tract infection) 07/09/2011  . Nausea and vomiting 07/08/2011  . Osteoarthritis 11/18/2008  . Hyperlipidemia 11/17/2008  . Essential hypertension 11/17/2008    Will change out Tramadol for Vicodin (which she requests).  Encourage ambulation.   4 Days Post-Op    LOS: 4 days   Matt B. Hassell Done, MD, [facility] [phone] beeper [phone]  03/24/2018 9:04 AM

## 2018-03-24 NOTE — Progress Notes (Signed)
Multiple tele alarms from monitor, EKG obtained and placed in chart. In and out of A. fib,  A. flutter, possible heart block, HR sustaining in the 40s-50s. Labs ordered. Mag and K replaced orally. Scheduled meds given including Tikosyn and cardizem. HR now sustaining in the 50s-60s reading sinus brady on the monitor. Will continue to monitor pt.

## 2018-03-24 NOTE — Progress Notes (Signed)
[facility] PROGRESS NOTE    Progress Note  [name]  IRS:[id number] DOB: [birth year] DOA: 03/20/2018 PCP: Crist Infante, MD     Brief Narrative:   [name] is an 77 y.o. 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  She also had bilateral stent placement with cystoscopy.  Assessment/Plan:   Active Problems:   S/P laparoscopic-assisted sigmoidectomy   Colonic mass Pain management and further management per surgery.  Addison's disease: Blood pressure seems to be stable, with a heart rate in the 60s Will continue current dose of steroids at her home dose.  Essential hypertension: Continue Cardizem, will continue to monitor blood pressure.  Hypothyroidism: Continue Synthroid.  Depression and anxiety: Continue fluoxetine gabapentin and clonazepam.  Chronic atrial fibrillation: Rate controlled on diltiazem and Dofetilide. Keep magnesium greater than 2 potassium greater than 4. When to resume Eliquis to be determined by surgery. Check a 12-lead EKG today and tomorrow keep her on telemetry. As Diflucan and the Deftilide can increase her QTC  CAD status post CABG: Continue aspirin and nitroglycerin as needed.  Chronic pain/lumbar radiculopathy: Has a back stimulator. No further pain, will continue to follow along with you.  Dysphagia likely due to oral thrush: Worsening oral thrush, increase Diflucan to 200 start on nystatin mouthwash. Strep throat negative. Give her Chloraseptic spray.  DVT prophylaxis: lovenxo Family Communication:son Disposition Plan/Barrier to D/C: per surgery Code Status:     Code  Status Orders  (From admission, onward)         Start     Ordered   03/20/18 1420  Full code  Continuous     03/20/18 1420        Code Status History    Date Active Date Inactive Code Status Order ID Comments User Context   09/24/2017 1707 09/24/2017 1803 DNR [id number]  Karmen Bongo, MD ED   07/23/2017 1234 07/27/2017 1656 Full Code [id number]  Baldwin Jamaica, PA-C Inpatient   07/03/2017 1822 07/06/2017 1541 Full Code [id number]  Daune Perch, NP Inpatient   03/22/2016 1335 03/25/2016 1604 Full Code [id number]  Brenton Grills, PA-C ED   09/02/2014 1636 09/03/2014 1437 Full Code [id number]  Wellington Hampshire, MD Inpatient   09/01/2014 2110 09/02/2014 1636 Full Code [id number]  Consuelo Pandy, PA-C Inpatient   07/09/2011 0041 07/09/2011 2126 Full Code [id number]  Dalbert Mayotte, LPN Inpatient   2/67/1245 1243 06/28/2011 1302 Full Code [id number]  Signe Colt, RN Inpatient    Advance Directive Documentation     Most Recent Value  Type of Advance Directive  Healthcare Power of Attorney, Living will  Pre-existing out of facility DNR order (yellow form or pink MOST form)  -  "MOST" Form in Place?  -        IV Access:    Peripheral IV   Procedures and diagnostic studies:   No results found.   Medical Consultants:    None.  Anti-Infectives:   Cephoteetan  Subjective:    LYNNEL ZANETTI continues to have dysphagia.  Objective:    Vitals:   03/23/18 1228 03/23/18 2112 03/24/18 0620 03/24/18 0650  BP: (!) 148/67 (!) 149/68 140/77   Pulse: 65 62 66   Resp:  20 14   Temp: 98 F (36.7 C) 98.6 F (37 C) 97.7 F (36.5 C)   TempSrc: Oral Oral Oral   SpO2: 97% 96% 96%   Weight:    93.9 kg  Height:        Intake/Output Summary (Last 24 hours) at 03/24/2018 1245 Last data filed at 03/24/2018 1497 Gross per 24 hour  Intake 220 ml  Output 2950 ml  Net -2730 ml   Filed Weights   03/22/18 0500 03/22/18 1445 03/24/18 0650  Weight: 94.6 kg 93.5 kg  93.9 kg    Exam: General exam: In no acute distress, she seems to have oral thrush as prior to the tongue and sides of the tongue. Respiratory system: Good air movement and clear to auscultation. Cardiovascular system: S1 & S2 heard, RRR. Gastrointestinal system: Abdomen is nondistended, soft and nontender.  Central nervous system: Alert and oriented. No focal neurological deficits. Extremities: No pedal edema. Skin: No rashes, lesions or ulcers Psychiatry: Judgement and insight appear normal. Mood & affect appropriate.    Data Reviewed:    Labs: Basic Metabolic Panel: Recent Labs  Lab 03/21/18 0312 03/22/18 0840 03/23/18 0554  NA 141 137 142  K 5.2* 4.2 4.1  CL 106 103 106  CO2 24 27 30   GLUCOSE 177* 138* 136*  BUN 13 18 17   CREATININE 0.85 0.94 0.83  CALCIUM 8.4* 8.5* 8.6*  MG 2.0 2.2 2.0  PHOS 4.0 2.9 3.3   GFR Estimated Creatinine Clearance: 62.7 mL/min (by C-G formula based on SCr of 0.83 mg/dL). Liver Function Tests: No results for input(s): AST, ALT, ALKPHOS, BILITOT, PROT, ALBUMIN in the last 168 hours. No results for input(s): LIPASE, AMYLASE in the last 168 hours. No results for input(s): AMMONIA in the last 168 hours. Coagulation profile No results for input(s): INR, PROTIME in the last 168 hours.  CBC: Recent Labs  Lab 03/21/18 0312 03/22/18 0840 03/23/18 0554 03/24/18 0511  WBC 14.9* 13.8* 10.6* 8.0  HGB 11.7* 11.2* 10.4* 10.9*  HCT 37.2 35.2* 33.1* 34.1*  MCV 96.9 97.0 97.1 96.3  PLT 209 217 193 201   Cardiac Enzymes: No results for input(s): CKTOTAL, CKMB, CKMBINDEX, TROPONINI in the last 168 hours. BNP (last 3 results) No results for input(s): PROBNP in the last 8760 hours. CBG: Recent Labs  Lab 03/23/18 1152 03/23/18 1625 03/23/18 2113 03/24/18 0722 03/24/18 1102  GLUCAP 97 203* 145* 111* 113*   D-Dimer: No results for input(s): DDIMER in the last 72 hours. Hgb A1c: No results for input(s): HGBA1C in the last 72 hours. Lipid  Profile: No results for input(s): CHOL, HDL, LDLCALC, TRIG, CHOLHDL, LDLDIRECT in the last 72 hours. Thyroid function studies: No results for input(s): TSH, T4TOTAL, T3FREE, THYROIDAB in the last 72 hours.  Invalid input(s): FREET3 Anemia work up: No results for input(s): VITAMINB12, FOLATE, FERRITIN, TIBC, IRON, RETICCTPCT in the last 72 hours. Sepsis Labs: Recent Labs  Lab 03/21/18 0312 03/22/18 0840 03/23/18 0554 03/24/18 0511  WBC 14.9* 13.8* 10.6* 8.0   Microbiology Recent Results (from the past 240 hour(s))  MRSA PCR Screening     Status: None   Collection Time: 03/20/18  2:21 PM  Result Value Ref Range Status   MRSA by PCR NEGATIVE NEGATIVE Final    Comment:        The GeneXpert MRSA Assay (FDA approved for NASAL specimens only), is one component of a comprehensive MRSA colonization surveillance program. It is not intended to diagnose MRSA infection nor to  guide or monitor treatment for MRSA infections. Performed at [facility], [place] [address], [place], [place] [address]   Group A Strep by PCR     Status: None   Collection Time: 03/23/18 11:36 AM  Result Value Ref Range Status   Group A Strep by PCR NOT DETECTED NOT DETECTED Final    Comment: Performed at [facility], [place]  [address] [place], [place] [address]     Medications:   . acetaminophen  1,000 mg Oral Q6H  . apixaban  5 mg Oral BID  . diltiazem  180 mg Oral QHS  . dofetilide  125 mcg Oral BID  . feeding supplement  237 mL Oral BID BM  . FLUoxetine  10 mg Oral q morning - 10a  . hydrocortisone  20 mg Oral BID  . insulin aspart  0-15 Units Subcutaneous TID WC  . insulin aspart  0-5 Units Subcutaneous QHS  . irbesartan  37.5 mg Oral Daily  . levothyroxine  88 mcg Oral QAC breakfast  . mouth rinse  15 mL Mouth Rinse BID  . nystatin  5 mL Oral QID  . pantoprazole  40 mg Oral Daily  . rOPINIRole  0.5 mg Oral QHS   Continuous Infusions: . [START ON  03/25/2018] fluconazole (DIFLUCAN) IV       LOS: 4 days   Charlynne Cousins  [facility]   *Please refer to [email or web address], password [email or web address] to get updated schedule on who will round on this patient, as hospitalists switch teams weekly. If 7PM-7AM, please contact night-coverage at [email or web address], password [email or web address] for any overnight needs.  03/24/2018, 12:45 PM

## 2018-03-24 NOTE — Clinical Social Work Note (Signed)
Clinical Social Work Assessment  Patient Details  Name: [name] MRN: [id number] Date of Birth: [birth year]  Date of referral:  03/21/18               Reason for consult:  Facility Placement                Permission sought to share information with:  Facility Art therapist granted to share information::  Yes, Verbal Permission Granted  Name::     Martena Emanuele  Agency::  SNF  Relationship::  Son  Contact Information:  [phone]  Housing/Transportation Living arrangements for the past 2 months:  [place] of Information:  Patient, Adult Children Patient Interpreter Needed:  None Criminal Activity/Legal Involvement Pertinent to Current Situation/Hospitalization:  No - Comment as needed Significant Relationships:  Adult Children, Friend Lives with:  Self Do you feel safe going back to the place where you live?  Yes Need for family participation in patient care:  No (Coment)  Care giving concerns:  Patient lives alone and has no family nearby to offer support. 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  She also had bilateral stent placement with cystoscopy. PT recommending short term therapy at SNF before returning home.    Social Worker assessment / plan:  CSW spoke with patient at bedside and son, Aaron Edelman, via speaker phone to discuss role and d/c plans. Patient reports she lives by herself and does not have family nearby who will be able to provide support upon d/c. Son, Aaron Edelman, lives in [place].  CSW explained SNF process in detail and answered questions from patient and son. Patient expressed concern with finding facility able to manage her care appropriately. CSW explained process and that facility will review her medical information  before accepting her.   Patient does not have preferences of facilities and would prefer to stay in the general [place] area.   CSW completed FL2 and faxed out referrals. Patient asks for CSW to f/u Monday with facilities who have accepted her so she can begin to review.  [employer] auth will need to be approved before d/c - explained process to patient and son.   Employment status:  Retired Nurse, adult PT Recommendations:  [place] / Referral to community resources:  [place]  Patient/Family's Response to care:  Patient and son were appreciative of social work involvement and explanation of process. They have many questions that CSW answered satisfactorily.  Patient/Family's Understanding of and Emotional Response to Diagnosis, Current Treatment, and Prognosis:  Patient and family understand SNF referral and process. They understand CSW role and next steps.   Emotional Assessment Appearance:  Appears stated age Attitude/Demeanor/Rapport:  Engaged Affect (typically observed):  Appropriate, Pleasant, Accepting Orientation:  Oriented to Self, Oriented to Place, Oriented to  Time, Oriented to Situation Alcohol / Substance use:  Not Applicable Psych involvement (Current and /or in the community):  No (Comment)  Discharge Needs  Concerns to be addressed:  Care Coordination Readmission within the last 30 days:  No Current discharge risk:  Physical Impairment, Lives alone Barriers to Discharge:  Continued Medical Work up, Scientist, research (life sciences)), [place], [place] 03/24/2018, 4:46 PM

## 2018-03-24 NOTE — NC FL2 (Signed)
[place] LEVEL OF CARE SCREENING TOOL     IDENTIFICATION  Patient Name: [name] Birthdate: 1940-11-27 Sex: female Admission Date (Current Location): 03/20/2018  [facility] and [place] Number:  Herbalist and Address:  [facility],  [address] [place], [place]      Provider Number: [id number]  Attending Physician Name and Address:  Ileana Roup, MD  Relative Name and Phone Number:  Kenniya Westrich: [phone]    Current Level of Care: Hospital Recommended Level of Care: [place] Prior Approval Number:    Date Approved/Denied:   PASRR Number: Pending  Discharge Plan: SNF    Current Diagnoses: Patient Active Problem List   Diagnosis Date Noted  . S/P laparoscopic-assisted sigmoidectomy 03/20/2018  . Colonic mass 03/20/2018  . History of adenomatous polyp of colon   . Polyp of ascending colon   . 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 Orthostatic hypotension 09/24/2017  . Visit for monitoring Tikosyn therapy 07/23/2017  . Depression 06/15/2017  . Acute respiratory failure with hypoxia ([place])   . Hypothyroidism   . Bronchitis 03/22/2016  . Abnormal CT of the chest 03/16/2016  . Coronary artery disease involving coronary bypass graft of native heart with angina pectoris ([place]) 03/16/2016  . Chronic anticoagulation 10/06/2015  . On dofetilide therapy 10/06/2015  . Atrial fibrillation ([place]) 09/15/2015  . Snoring 02/17/2015  . Postoperative examination 12/22/2014  . Chronic low back pain 11/09/2014  . Disc degeneration, lumbar 11/09/2014  . Addison disease ([place]) 09/30/2014  . Chronic back pain 09/30/2014  . Hematochezia-(Hgb stable) 09/03/2014  . DOE (dyspnea on exertion) 09/01/2014  . Lumbar radiculopathy 04/27/2014  . UTI (lower urinary tract infection) 07/09/2011  . Nausea and vomiting 07/08/2011  . Osteoarthritis 11/18/2008  . Hyperlipidemia 11/17/2008  . Essential hypertension 11/17/2008     Orientation RESPIRATION BLADDER Height & Weight     Self, Situation, Place, Time  Normal Continent Weight: 207 lb 0.2 oz (93.9 kg) Height:  5' 4.5" (163.8 cm)  BEHAVIORAL SYMPTOMS/MOOD NEUROLOGICAL BOWEL NUTRITION STATUS      Continent Diet(Regular)  AMBULATORY STATUS COMMUNICATION OF NEEDS Skin   Limited Assist Verbally Surgical wounds(Abdomen incision)                       Personal Care Assistance Level of Assistance  Bathing, Feeding, Dressing Bathing Assistance: Limited assistance Feeding assistance: Independent Dressing Assistance: Limited assistance     Functional Limitations Info  Speech, Hearing, Sight Sight Info: Adequate Hearing Info: Adequate Speech Info: Adequate    SPECIAL CARE FACTORS FREQUENCY  PT (By licensed PT), OT (By licensed OT)     PT Frequency: 5x/week OT Frequency: 5x/week            Contractures Contractures Info: Not present    Additional Factors Info  Code Status, Allergies, Psychotropic Code Status Info: Full Allergies Info: PERCOCET OXYCODONE-ACETAMINOPHEN, TRAMADOL, SULFA ANTIBIOTICS, SULFACETAMIDE SODIUM  Psychotropic Info: Prozac         Current Medications (03/24/2018):  This is the current hospital active medication list Current Facility-Administered Medications  Medication Dose Route Frequency Provider Last Rate Last Dose  . acetaminophen (TYLENOL) tablet 1,000 mg  1,000 mg Oral Q6H Ileana Roup, MD   1,000 mg at 03/24/18 1122  . alum & mag hydroxide-simeth (MAALOX/MYLANTA) 200-200-20 MG/5ML suspension 30 mL  30 mL Oral Q6H PRN Ileana Roup, MD      . apixaban [facility]) tablet 5 mg  5  mg Oral BID Ileana Roup, MD   5 mg at 03/24/18 0932  . diazepam (VALIUM) tablet 2 mg  2 mg Oral BID PRN Johnathan Hausen, MD   2 mg at 03/24/18 1015  . diltiazem (CARDIZEM CD) 24 hr capsule 180 mg  180 mg Oral QHS Ileana Roup, MD   180 mg at 03/23/18 2207  . diphenhydrAMINE (BENADRYL) 12.5 MG/5ML elixir  12.5 mg  12.5 mg Oral Q6H PRN Ileana Roup, MD       Or  . diphenhydrAMINE (BENADRYL) injection 12.5 mg  12.5 mg Intravenous Q6H PRN Ileana Roup, MD      . dofetilide [facility]) capsule 125 mcg  125 mcg Oral BID Ileana Roup, MD   125 mcg at 03/24/18 0940  . feeding supplement (ENSURE SURGERY) liquid 237 mL  237 mL Oral BID BM Ileana Roup, MD   237 mL at 03/24/18 1323  . [START ON 03/25/2018] fluconazole (DIFLUCAN) IVPB 200 mg  200 mg Intravenous Q24H Charlynne Cousins, MD      . FLUoxetine (PROZAC) capsule 10 mg  10 mg Oral q morning - 10a Ileana Roup, MD   10 mg at 03/24/18 0932  . hydrALAZINE (APRESOLINE) injection 10 mg  10 mg Intravenous Q2H PRN Ileana Roup, MD   10 mg at 03/21/18 2343  . HYDROcodone-acetaminophen (NORCO/VICODIN) 5-325 MG per tablet 1 tablet  1 tablet Oral Q4H PRN Johnathan Hausen, MD      . hydrocortisone (CORTEF) tablet 20 mg  20 mg Oral BID Ileana Roup, MD   20 mg at 03/24/18 0933  . HYDROmorphone (DILAUDID) injection 0.5 mg  0.5 mg Intravenous Q3H PRN Ileana Roup, MD   0.5 mg at 03/21/18 1824  . hyoscyamine (LEVSIN SL) SL tablet 0.125 mg  0.125 mg Sublingual Daily PRN Ileana Roup, MD   0.125 mg at 03/24/18 0932  . ibuprofen (ADVIL,MOTRIN) tablet 600 mg  600 mg Oral Q6H PRN Ileana Roup, MD   600 mg at 03/24/18 9379  . insulin aspart (novoLOG) injection 0-15 Units  0-15 Units Subcutaneous TID WC Ileana Roup, MD   5 Units at 03/23/18 1727  . insulin aspart (novoLOG) injection 0-5 Units  0-5 Units Subcutaneous QHS Ileana Roup, MD      . irbesartan (AVAPRO) tablet 37.5 mg  37.5 mg Oral Daily Ileana Roup, MD   37.5 mg at 03/24/18 0932  . levothyroxine (SYNTHROID, LEVOTHROID) tablet 88 mcg  88 mcg Oral QAC breakfast Ileana Roup, MD   88 mcg at 03/24/18 0240  . magnesium oxide (MAG-OX) tablet 400 mg  400 mg Oral BID Charlynne Cousins, MD      . MEDLINE  mouth rinse  15 mL Mouth Rinse BID Ileana Roup, MD   15 mL at 03/24/18 0933  . menthol-cetylpyridinium (CEPACOL) lozenge 3 mg  1 lozenge Oral PRN Ileana Roup, MD   3 mg at 03/21/18 0948  . nystatin (MYCOSTATIN) 100000 UNIT/ML suspension 500,000 Units  5 mL Oral QID Charlynne Cousins, MD   500,000 Units at 03/24/18 1323  . ondansetron (ZOFRAN) tablet 4 mg  4 mg Oral Q6H PRN Ileana Roup, MD       Or  . ondansetron [facility]) injection 4 mg  4 mg Intravenous Q6H PRN Ileana Roup, MD      . pantoprazole (PROTONIX) EC tablet 40 mg  40 mg Oral Daily Nadeen Landau M,  MD   40 mg at 03/24/18 0932  . phenol (CHLORASEPTIC) mouth spray 1 spray  1 spray Mouth/Throat PRN Charlynne Cousins, MD   1 spray at 03/23/18 1130  . potassium chloride SA (K-DUR,KLOR-CON) CR tablet 40 mEq  40 mEq Oral BID Charlynne Cousins, MD      . rOPINIRole (REQUIP) tablet 0.5 mg  0.5 mg Oral QHS Ileana Roup, MD   0.5 mg at 03/23/18 2207     Discharge Medications: Please see discharge summary for a list of discharge medications.  Relevant Imaging Results:  Relevant Lab Results:   Additional Information SSN: [id number]  Pricilla Holm, [place]

## 2018-03-25 ENCOUNTER — Encounter (HOSPITAL_COMMUNITY): Payer: Self-pay | Admitting: Surgery

## 2018-03-25 DIAGNOSIS — K6389 Other specified diseases of intestine: Secondary | ICD-10-CM

## 2018-03-25 DIAGNOSIS — K5732 Diverticulitis of large intestine without perforation or abscess without bleeding: Secondary | ICD-10-CM

## 2018-03-25 LAB — BASIC METABOLIC PANEL
Anion gap: 9 (ref 5–15)
BUN: 18 mg/dL (ref 8–23)
CALCIUM: 8.4 mg/dL — AB (ref 8.9–10.3)
CO2: 28 mmol/L (ref 22–32)
Chloride: 103 mmol/L (ref 98–111)
Creatinine, Ser: 0.68 mg/dL (ref 0.44–1.00)
GFR calc Af Amer: >60 mL/min (ref >60)
Glucose, Bld: 146 mg/dL — ABNORMAL HIGH (ref 70–99)
Potassium: 4.8 mmol/L (ref 3.5–5.1)
Sodium: 140 mmol/L (ref 135–145)

## 2018-03-25 LAB — CBC
HCT: 35.5 % — ABNORMAL LOW (ref 36.0–46.0)
Hemoglobin: 11.3 g/dL — ABNORMAL LOW (ref 12.0–15.0)
MCH: 30.6 pg (ref 26.0–34.0)
MCHC: 31.8 g/dL (ref 30.0–36.0)
MCV: 96.2 fL (ref 80.0–100.0)
Platelets: 207 10*3/uL (ref 150–400)
RBC: 3.69 MIL/uL — ABNORMAL LOW (ref 3.87–5.11)
RDW: 13 % (ref 11.5–15.5)
WBC: 7.9 10*3/uL (ref 4.0–10.5)
nRBC: 0 % (ref 0.0–0.2)

## 2018-03-25 LAB — GLUCOSE, CAPILLARY
Glucose-Capillary: 106 mg/dL — ABNORMAL HIGH (ref 70–99)
Glucose-Capillary: 108 mg/dL — ABNORMAL HIGH (ref 70–99)
Glucose-Capillary: 146 mg/dL — ABNORMAL HIGH (ref 70–99)
Glucose-Capillary: 97 mg/dL (ref 70–99)

## 2018-03-25 LAB — MAGNESIUM: Magnesium: 2.1 mg/dL (ref 1.7–2.4)

## 2018-03-25 LAB — PHOSPHORUS: Phosphorus: 4 mg/dL (ref 2.5–4.6)

## 2018-03-25 MED ORDER — FLUCONAZOLE IN SODIUM CHLORIDE 200-0.9 MG/100ML-% IV SOLN
200.0000 mg | INTRAVENOUS | Status: AC
  Administered 2018-03-25 – 2018-03-26 (×2): 200 mg via INTRAVENOUS
  Filled 2018-03-25 (×2): qty 100

## 2018-03-25 MED ORDER — FLUCONAZOLE 100 MG PO TABS
200.0000 mg | ORAL_TABLET | Freq: Every day | ORAL | Status: DC
  Administered 2018-03-27 – 2018-04-01 (×6): 200 mg via ORAL
  Filled 2018-03-25 (×6): qty 2

## 2018-03-25 MED ORDER — DEXTROSE 50 % IV SOLN
INTRAVENOUS | Status: AC
  Filled 2018-03-25: qty 50

## 2018-03-25 NOTE — Progress Notes (Signed)
[facility] PROGRESS NOTE    Progress Note  [name]  ZOX:[id number] DOB: [birth year] DOA: 03/20/2018 PCP: Crist Infante, MD     Brief Narrative:   [name] is an 77 y.o. 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  She also had bilateral stent placement with cystoscopy.  Assessment/Plan:   Active Problems:   S/P laparoscopic-assisted sigmoidectomy   Colonic mass Pain management and further management per surgery.   Addison's disease: Blood pressure seems to be stable, with a heart rate in the 60s Will continue current dose of steroids at her home dose.  Essential hypertension: Continue Cardizem, will continue to monitor blood pressure.  Hypothyroidism: Continue Synthroid.  Depression and anxiety: Continue fluoxetine gabapentin and clonazepam.  Chronic atrial fibrillation: Rate controlled on diltiazem and Dofetilide. Keep magnesium greater than 2 potassium greater than 4. When to resume Eliquis to be determined by surgery.   CAD status post CABG: Continue aspirin and nitroglycerin as needed.  Chronic pain/lumbar radiculopathy: Has a back stimulator. No further pain, will continue to follow along with you.  Dysphagia likely due to oral thrush: She relates her swallowing is better we will continue Diflucan IV for 1 additional day then she can take Diflucan 200 mg daily at home for the next 7 days. She will have to continue the nystatin swash by mouth for 5 additional days. We will sign off.  DVT prophylaxis: lovenxo Family Communication:son Disposition Plan/Barrier to D/C: Will sign off. Code  Status:     Code Status Orders  (From admission, onward)         Start     Ordered   03/20/18 1420  Full code  Continuous     03/20/18 1420        Code Status History    Date Active Date Inactive Code Status Order ID Comments User Context   09/24/2017 1707 09/24/2017 1803 DNR [id number]  Karmen Bongo, MD ED   07/23/2017 1234 07/27/2017 1656 Full Code [id number]  Baldwin Jamaica, PA-C Inpatient   07/03/2017 1822 07/06/2017 1541 Full Code [id number]  Daune Perch, NP Inpatient   03/22/2016 1335 03/25/2016 1604 Full Code [id number]  Brenton Grills, PA-C ED   09/02/2014 1636 09/03/2014 1437 Full Code [id number]  Wellington Hampshire, MD Inpatient   09/01/2014 2110 09/02/2014 1636 Full Code [id number]  Consuelo Pandy, PA-C Inpatient   07/09/2011 0041 07/09/2011 2126 Full Code [id number]  Dalbert Mayotte, LPN Inpatient   6/43/3295 1243 06/28/2011 1302 Full Code [id number]  Signe Colt, RN Inpatient    Advance Directive Documentation     Most Recent Value  Type of Advance Directive  Healthcare Power of Attorney, Living will  Pre-existing out of facility DNR order (yellow form or pink MOST form)  -  "MOST" Form in Place?  -        IV Access:    Peripheral IV   Procedures and diagnostic studies:   No results found.   Medical Consultants:    None.  Anti-Infectives:   Cephoteetan  Subjective:    [name] dysphagia is improved she is tolerating her diet.  Objective:    Vitals:   03/24/18 0650 03/24/18 1413 03/24/18 2053 03/25/18  0615  BP:  (!) 145/71 120/62 (!) 153/68  Pulse:  66 (!) 53 (!) 57  Resp:  (!) 24 20 17   Temp:  98.2 F (36.8 C) 98.1 F (36.7 C) 98.2 F (36.8 C)  TempSrc:  Oral Oral Oral  SpO2:  96% 97% 96%  Weight: 93.9 kg   93.6 kg  Height:        Intake/Output Summary (Last 24 hours) at 03/25/2018 0857 Last data filed at 03/25/2018 0254 Gross per 24 hour  Intake 290 ml  Output 1900 ml  Net -1610 ml   Filed Weights    03/22/18 1445 03/24/18 0650 03/25/18 0615  Weight: 93.5 kg 93.9 kg 93.6 kg    Exam: General exam: In no acute distress, she seems to have oral thrush as prior to the tongue and sides of the tongue. Respiratory system: Good air movement and clear to auscultation. Cardiovascular system: S1 & S2 heard, RRR. Gastrointestinal system: Abdomen is nondistended, soft and nontender.  Central nervous system: Alert and oriented. No focal neurological deficits. Extremities: No pedal edema. Skin: No rashes, lesions or ulcers Psychiatry: Judgement and insight appear normal. Mood & affect appropriate.    Data Reviewed:    Labs: Basic Metabolic Panel: Recent Labs  Lab 03/21/18 0312 03/22/18 0840 03/23/18 0554 03/24/18 1344 03/24/18 2059 03/25/18 0434  NA 141 137 142 138 139 140  K 5.2* 4.2 4.1 3.6 3.6 4.8  CL 106 103 106 101 104 103  CO2 24 27 30 27 29 28   GLUCOSE 177* 138* 136* 173* 140* 146*  BUN 13 18 17 20 19 18   CREATININE 0.85 0.94 0.83 0.75 0.90 0.68  CALCIUM 8.4* 8.5* 8.6* 8.5* 8.2* 8.4*  MG 2.0 2.2 2.0 1.7 1.8 2.1  PHOS 4.0 2.9 3.3 3.6  --  4.0   GFR Estimated Creatinine Clearance: 65 mL/min (by C-G formula based on SCr of 0.68 mg/dL). Liver Function Tests: No results for input(s): AST, ALT, ALKPHOS, BILITOT, PROT, ALBUMIN in the last 168 hours. No results for input(s): LIPASE, AMYLASE in the last 168 hours. No results for input(s): AMMONIA in the last 168 hours. Coagulation profile No results for input(s): INR, PROTIME in the last 168 hours.  CBC: Recent Labs  Lab 03/22/18 0840 03/23/18 0554 03/24/18 0511 03/24/18 1344 03/25/18 0434  WBC 13.8* 10.6* 8.0 9.8 7.9  HGB 11.2* 10.4* 10.9* 11.8* 11.3*  HCT 35.2* 33.1* 34.1* 36.4 35.5*  MCV 97.0 97.1 96.3 95.8 96.2  PLT 217 193 201 219 207   Cardiac Enzymes: No results for input(s): CKTOTAL, CKMB, CKMBINDEX, TROPONINI in the last 168 hours. BNP (last 3 results) No results for input(s): PROBNP in the last 8760  hours. CBG: Recent Labs  Lab 03/24/18 0722 03/24/18 1102 03/24/18 1717 03/24/18 2054 03/25/18 0720  GLUCAP 111* 113* 159* 128* 106*   D-Dimer: No results for input(s): DDIMER in the last 72 hours. Hgb A1c: No results for input(s): HGBA1C in the last 72 hours. Lipid Profile: No results for input(s): CHOL, HDL, LDLCALC, TRIG, CHOLHDL, LDLDIRECT in the last 72 hours. Thyroid function studies: No results for input(s): TSH, T4TOTAL, T3FREE, THYROIDAB in the last 72 hours.  Invalid input(s): FREET3 Anemia work up: No results for input(s): VITAMINB12, FOLATE, FERRITIN, TIBC, IRON, RETICCTPCT in the last 72 hours. Sepsis Labs: Recent Labs  Lab 03/23/18 0554 03/24/18 0511 03/24/18 1344 03/25/18 0434  WBC 10.6* 8.0 9.8 7.9   Microbiology Recent Results (from the past 240 hour(s))  MRSA PCR Screening  Status: None   Collection Time: 03/20/18  2:21 PM  Result Value Ref Range Status   MRSA by PCR NEGATIVE NEGATIVE Final    Comment:        The GeneXpert MRSA Assay (FDA approved for NASAL specimens only), is one component of a comprehensive MRSA colonization surveillance program. It is not intended to diagnose MRSA infection nor to guide or monitor treatment for MRSA infections. Performed at [facility], [place] [address] [place], [place] [address]   Group A Strep by PCR     Status: None   Collection Time: 03/23/18 11:36 AM  Result Value Ref Range Status   Group A Strep by PCR NOT DETECTED NOT DETECTED Final    Comment: Performed at [facility], [place] [address], [place], [place] [address]     Medications:   . acetaminophen  1,000 mg Oral Q6H  . apixaban  5 mg Oral BID  . diltiazem  180 mg Oral QHS  . dofetilide  125 mcg Oral BID  . feeding supplement  237 mL Oral BID BM  . FLUoxetine  10 mg Oral q morning - 10a  . hydrocortisone  20 mg Oral BID  . insulin aspart  0-15 Units Subcutaneous TID WC  . insulin aspart  0-5 Units  Subcutaneous QHS  . irbesartan  37.5 mg Oral Daily  . levothyroxine  88 mcg Oral QAC breakfast  . mouth rinse  15 mL Mouth Rinse BID  . nystatin  5 mL Oral QID  . pantoprazole  40 mg Oral Daily  . rOPINIRole  0.5 mg Oral QHS   Continuous Infusions: . fluconazole (DIFLUCAN) IV       LOS: 5 days   Charlynne Cousins  [facility]   *Please refer to [email or web address], password [email or web address] to get updated schedule on who will round on this patient, as hospitalists switch teams weekly. If 7PM-7AM, please contact night-coverage at [email or web address], password [email or web address] for any overnight needs.  03/25/2018, 8:57 AM

## 2018-03-25 NOTE — Progress Notes (Signed)
Pathology benign  Diagnosis 1. Colon, segmental resection, sigmoid, distal descending - DIVERTICULAR DISEASE, RUPTURED WITH ABSCESS FORMATION. 2. Soft tissue, lipoma, descending colon mesentery - MATURE FIBROADIPOSE TISSUE. Gillie Manners MD Pathologist, Electronic Signature (Case signed 03/21/2018)

## 2018-03-25 NOTE — Progress Notes (Signed)
Physical Therapy Treatment Patient Details Name: [name] MRN: [id number] DOB: [birth year] Today's Date: 03/25/2018    History of Present Illness Pt is s/p laparoscopic-assisted sigmoidectomy, colostomy, takedown of fistula and bil ureteral stents.  PMH:  chronic back pain--neurostimulator; CABG, CAD, AFib, Addison's disease and HTN      PT Comments    Progressing with mobility.    Follow Up Recommendations  SNF     Equipment Recommendations  None recommended by PT    Recommendations for Other Services       Precautions / Restrictions Precautions Precautions: Fall Precaution Comments: abd sx, L colostomy Restrictions Weight Bearing Restrictions: No    Mobility  Bed Mobility               General bed mobility comments: oob in recliner  Transfers Overall transfer level: Needs assistance Equipment used: Rolling walker (2 wheeled) Transfers: Sit to/from Stand Sit to Stand: Supervision         General transfer comment: for safety  Ambulation/Gait Ambulation/Gait assistance: Supervision Gait Distance (Feet): 350 Feet Assistive device: Rolling walker (2 wheeled) Gait Pattern/deviations: Step-through pattern;Decreased stride length     General Gait Details: for safety. slow gait speed.    Stairs             Wheelchair Mobility    Modified Rankin (Stroke Patients Only)       Balance Overall balance assessment: Mild deficits observed, not formally tested                                          Cognition Arousal/Alertness: Awake/alert Behavior During Therapy: WFL for tasks assessed/performed Overall Cognitive Status: Within Functional Limits for tasks assessed                                        Exercises      General Comments        Pertinent Vitals/Pain Pain Assessment: 0-10 Pain Score: 6  Pain Location: abdomen, back Pain Descriptors / Indicators: Discomfort;Sore Pain  Intervention(s): Monitored during session;Repositioned    Home Living                      Prior Function            PT Goals (current goals can now be found in the care plan section) Progress towards PT goals: Progressing toward goals    Frequency    Min 2X/week      PT Plan Current plan remains appropriate    Co-evaluation              AM-PAC PT "6 Clicks" Mobility   Outcome Measure  Help needed turning from your back to your side while in a flat bed without using bedrails?: A Little Help needed moving from lying on your back to sitting on the side of a flat bed without using bedrails?: A Little Help needed moving to and from a bed to a chair (including a wheelchair)?: A Little Help needed standing up from a chair using your arms (e.g., wheelchair or bedside chair)?: A Little Help needed to walk in hospital room?: A Little Help needed climbing 3-5 steps with a railing? : A Little 6 Click Score: 18    End of Session   Activity  Tolerance: Patient tolerated treatment well Patient left: with call bell/phone within reach;in chair;with family/visitor present   PT Visit Diagnosis: Unsteadiness on feet (R26.81)     Time: 5521-7471 PT Time Calculation (min) (ACUTE ONLY): 19 min  Charges:  $Gait Training: 8-22 mins                        Weston Anna, [place] Pager: [phone] Office: [phone]

## 2018-03-25 NOTE — Consult Note (Signed)
[facility] Nurse ostomy follow up Stoma type/location: LlQ, end colostomy Stomal assessment/size: 1" x 1 1/2" oval, pink, flush with skin os at 12 o'clock  Peristomal assessment: denudation at 9-11 o'clock, satellite lesions along proximal edge of tape border and at 3 and o'clock  Treatment options for stomal/peristomal skin: crusted affected skin with antifungal and no sting skin barrier wipes Output pasty, green  Ostomy pouching: 1pc. Flexible convex with 2" barrier ring Education provided:  Explained stoma characteristics (budded, flush, color, texture, care) Explained areas of concern in skin fold to patient, noted skin denudation and discussed with patient Discussed new onset yeast (presenting with satellite lesions, these are note to be worse in crease where moisture and effluent may pool Assisted patient with pouch change  Cutting new skin barrier, measuring stoma, cleaning peristomal skin and stoma, use of barrier ring) Education on emptying when 1/3 to 1/2 full and how to empty Demonstrated use of wick to clean spout  Allowed patient to practice with lock and roll closure, her hand and finger strength are poor and she did have difficult with this. Discussed bathing, diet, gas, medication use, constipation Discussed treatment of peristomal skin (ostomy powder, skin barrier wipes) skin changes discussed and explanation of what skin changes are from  Added ostomy belt and educated patient on how to apply belt.   Enrolled patient in [place] Start Discharge program: Yes  Will ask [place] nurse covering the [facility] to see patient again tomorrow to reinforce teaching and check status of pouch.  [place], [place], [place]

## 2018-03-25 NOTE — Care Management Important Message (Signed)
Important Message  Patient Details  Name: [name] MRN: [id number] Date of Birth: 11/07/1940   Medicare Important Message Given:  Yes    Kerin Salen 03/25/2018, 11:13 AMImportant Message  Patient Details  Name: [name] MRN: [id number] Date of Birth: October 08, 1940   Medicare Important Message Given:  Yes    Kerin Salen 03/25/2018, 11:13 AM

## 2018-03-25 NOTE — Progress Notes (Addendum)
[facility] Progress Note  5 Days Post-Op  Subjective: CC: foley is bothering her Foley is irritating. Tolerating diet, having bowel function. Patient very pleasant. Awaiting SNF placement. Reports her son will be coming to see her later today.   Objective: Vital signs in last 24 hours: Temp:  [98.1 F (36.7 C)-98.2 F (36.8 C)] 98.2 F (36.8 C) (12/23 0615) Pulse Rate:  [53-66] 57 (12/23 0615) Resp:  [17-24] 17 (12/23 0615) BP: (120-153)/(62-71) 153/68 (12/23 0615) SpO2:  [96 %-97 %] 96 % (12/23 0615) Weight:  [93.6 kg] 93.6 kg (12/23 0615) Last BM Date: 03/24/18  Intake/Output from previous day: 12/22 0701 - 12/23 0700 In: 290 [P.O.:240; IV Piggyback:50] Out: 1900 [Urine:1825; Stool:75] Intake/Output this shift: No intake/output data recorded.  PE: Gen:  Alert, NAD, pleasant Card:  Regular rate and rhythm Pulm:  Normal effort, clear to auscultation bilaterally Abd: Soft, appropriately tender, non-distended, bowel sounds present, incisions c/d/i, ostomy appliance with soft brown stool present Skin: warm and dry, no rashes  Psych: A&Ox3   Lab Results:  Recent Labs    03/24/18 1344 03/25/18 0434  WBC 9.8 7.9  HGB 11.8* 11.3*  HCT 36.4 35.5*  PLT 219 207   BMET Recent Labs    03/24/18 2059 03/25/18 0434  NA 139 140  K 3.6 4.8  CL 104 103  CO2 29 28  GLUCOSE 140* 146*  BUN 19 18  CREATININE 0.90 0.68  CALCIUM 8.2* 8.4*   PT/INR No results for input(s): LABPROT, INR in the last 72 hours. CMP     Component Value Date/Time   NA 140 03/25/2018 0434   NA 140 06/06/2017 1102   K 4.8 03/25/2018 0434   CL 103 03/25/2018 0434   CO2 28 03/25/2018 0434   GLUCOSE 146 (H) 03/25/2018 0434   BUN 18 03/25/2018 0434   BUN 18 06/06/2017 1102   CREATININE 0.68 03/25/2018 0434   CREATININE 0.80 12/07/2015 0933   CALCIUM 8.4 (L) 03/25/2018 0434   PROT 6.5 03/15/2018 1213   ALBUMIN 4.0 03/15/2018 1213   AST 21 03/15/2018 1213   ALT 21 03/15/2018 1213    ALKPHOS 58 03/15/2018 1213   BILITOT 1.0 03/15/2018 1213   GFRNONAA >60 03/25/2018 0434   GFRAA >60 03/25/2018 0434   Lipase     Component Value Date/Time   LIPASE 30 09/10/2013 1159       Studies/Results: No results found.  Anti-infectives: Anti-infectives (From admission, onward)   Start     Dose/Rate Route Frequency Ordered Stop   03/25/18 1000  fluconazole (DIFLUCAN) IVPB 200 mg     200 mg 100 mL/hr over 60 Minutes Intravenous Every 24 hours 03/24/18 1245     03/24/18 1000  fluconazole (DIFLUCAN) IVPB 100 mg  Status:  Discontinued     100 mg 50 mL/hr over 60 Minutes Intravenous Every 24 hours 03/23/18 0859 03/24/18 1245   03/23/18 1000  fluconazole (DIFLUCAN) IVPB 200 mg     200 mg 100 mL/hr over 60 Minutes Intravenous Once 03/23/18 0859 03/23/18 1242   03/20/18 1400  neomycin (MYCIFRADIN) tablet 1,000 mg  Status:  Discontinued     1,000 mg Oral 3 times per day 03/20/18 0644 03/20/18 0645   03/20/18 1400  metroNIDAZOLE (FLAGYL) tablet 1,000 mg  Status:  Discontinued     1,000 mg Oral 3 times per day 03/20/18 0644 03/20/18 0645   03/20/18 0645  cefoTEtan (CEFOTAN) 2 g in sodium chloride 0.9 % 100 mL IVPB  2 g 200 mL/hr over 30 Minutes Intravenous On call to O.R. 03/20/18 0644 03/20/18 0840       Assessment/Plan Addison's disease - home dose steroids HTN Hypothyroidism Depression/Anxiety Chronic atrial fibrillation - back on eliquis, appreciate medicine input CAD s/p CABG Chronic pain/lumbar radiculopathy Dysphagia/oral thrush - diflucan, nystatin mouthwash  S/p laparoscopic assisted sigmoidectomy, colostomy, repair of colovesical fistula, excision of descending colon mesenteric mass - 03/20/2018 - [facility]  - POD#5  - patient tolerating a diet and having bowel function  - foley to remain, OP cystogram to be arranged per Dr. Dema Severin  - continue to mobilize  - awaiting SNF placement, stable for discharge when SNF bed available   LOS: 5 days    Brigid Re , [facility] 03/25/2018, 8:47 AM Pager: [phone] Consults: [phone]  Agree with above. Heading for SNF.  Son and son-in-law at bedside. She looks good and has no complaint.  Alphonsa Overall, MD, [facility] Pager: [phone] Office phone:  [phone]

## 2018-03-26 LAB — GLUCOSE, CAPILLARY
Glucose-Capillary: 101 mg/dL — ABNORMAL HIGH (ref 70–99)
Glucose-Capillary: 91 mg/dL (ref 70–99)
Glucose-Capillary: 105 mg/dL — ABNORMAL HIGH (ref 70–99)
Glucose-Capillary: 161 mg/dL — ABNORMAL HIGH (ref 70–99)

## 2018-03-26 MED ORDER — CLONAZEPAM 0.5 MG PO TABS
0.2500 mg | ORAL_TABLET | Freq: Two times a day (BID) | ORAL | Status: DC | PRN
  Administered 2018-03-26 – 2018-04-01 (×8): 0.25 mg via ORAL
  Filled 2018-03-26 (×16): qty 1

## 2018-03-26 MED ORDER — DEXTROSE 50 % IV SOLN
INTRAVENOUS | Status: AC
  Filled 2018-03-26: qty 50

## 2018-03-26 MED ORDER — OXYBUTYNIN CHLORIDE 5 MG PO TABS
2.5000 mg | ORAL_TABLET | Freq: Two times a day (BID) | ORAL | Status: DC | PRN
  Administered 2018-03-26: 2.5 mg via ORAL
  Filled 2018-03-26: qty 1

## 2018-03-26 NOTE — Progress Notes (Signed)
[facility] Office:  [phone] General Surgery Progress Note   LOS: 6 days  POD -  6 Days Post-Op  Chief Complaint: Colovesical fistula  Assessment and Plan: 1.  LAPAROSCOPIC SIGMOIDECTOMY WITH COLOSTOMY LAPAROSCOPIC TAKE DOWN OF COLOVESICAL FISTULA CYSTOSCOPY WITH BILATERAL RETROGRADE AND BILATERAL URETERAL CATHETER PLACEMENT - 03/20/2018 - [facility]  Some rash around ostomy.  Her biggest complaint is the foley.  Otherwise doing well and awaiting SNF.  2.  Addison's disease - home dose steroids 3.  HTN 4.  Hypothyroidism 5.  Depression/Anxiety  Takes both Valium and Klonopin at home. So how the Klonopin was dropped.  I added the Klonopin back. 6.  Chronic atrial fibrillation - back on eliquis 7. CAD s/p CABG 8.  Chronic pain/lumbar radiculopathy 9.  Dysphagia/oral thrush - diflucan, nystatin mouthwash 10.  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  The foley is bothering her the most.  I added Klonopin back to her meds in that this seemed to help the most.  Objective:   Vitals:   03/25/18 2104 03/26/18 0528  BP: (!) 141/74 139/82  Pulse: (!) 57 (!) 59  Resp: 20 20  Temp: 98.4 F (36.9 C) 98.7 F (37.1 C)  SpO2: 97% 97%     Intake/Output from previous day:  12/23 0701 - 12/24 0700 In: 220 [P.O.:120; IV Piggyback:100] Out: 2515 [Urine:2250; Stool:265]  Intake/Output this shift:  No intake/output data recorded.   Physical Exam:   General: WN WF who is alert and oriented.    HEENT: Normal. Pupils equal. .   Lungs: Clear   Abdomen: Soft.  Has BS.   Wound: Ostomy LLQ.  Wounds okay.   Lab Results:    Recent  Labs    03/24/18 1344 03/25/18 0434  WBC 9.8 7.9  HGB 11.8* 11.3*  HCT 36.4 35.5*  PLT 219 207    BMET   Recent Labs    03/24/18 2059 03/25/18 0434  NA 139 140  K 3.6 4.8  CL 104 103  CO2 29 28  GLUCOSE 140* 146*  BUN 19 18  CREATININE 0.90 0.68  CALCIUM 8.2* 8.4*    PT/INR  No results for input(s): LABPROT, INR in the last 72 hours.  ABG  No results for input(s): PHART, HCO3 in the last 72 hours.  Invalid input(s): PCO2, PO2   Studies/Results:  No results found.   Anti-infectives:   Anti-infectives (From admission, onward)   Start     Dose/Rate Route Frequency Ordered Stop   03/27/18 1000  fluconazole (DIFLUCAN) tablet 200 mg     200 mg Oral Daily 03/25/18 0858     03/25/18 1000  fluconazole (DIFLUCAN) IVPB 200 mg  Status:  Discontinued     200 mg 100 mL/hr over 60 Minutes Intravenous Every 24 hours 03/24/18 1245 03/25/18 0858   03/25/18 1000  fluconazole (DIFLUCAN) IVPB 200 mg     200 mg 100 mL/hr over 60 Minutes Intravenous Every 24 hours 03/25/18 0858 03/27/18 0959   03/24/18 1000  fluconazole (DIFLUCAN) IVPB 100 mg  Status:  Discontinued     100 mg 50 mL/hr over 60 Minutes Intravenous Every 24 hours 03/23/18 0859 03/24/18 1245  03/23/18 1000  fluconazole (DIFLUCAN) IVPB 200 mg     200 mg 100 mL/hr over 60 Minutes Intravenous Once 03/23/18 0859 03/23/18 1242   03/20/18 1400  neomycin (MYCIFRADIN) tablet 1,000 mg  Status:  Discontinued     1,000 mg Oral 3 times per day 03/20/18 0644 03/20/18 0645   03/20/18 1400  metroNIDAZOLE (FLAGYL) tablet 1,000 mg  Status:  Discontinued     1,000 mg Oral 3 times per day 03/20/18 0644 03/20/18 0645   03/20/18 0645  cefoTEtan (CEFOTAN) 2 g in sodium chloride 0.9 % 100 mL IVPB     2 g 200 mL/hr over 30 Minutes Intravenous On call to O.R. 03/20/18 0644 03/20/18 0840      Alphonsa Overall, MD, FACS Pager: [facility] Office: [phone] 03/26/2018

## 2018-03-26 NOTE — Consult Note (Signed)
[facility] Nurse ostomy follow up Stoma type/location: LUQ colostomy. Pouch applied yesterday is intact.  Steps to empty pouch reviewed with patient and bedside RN.  Patient to begin emptying pouch today and using toilet paper to cleanse bottom of tail closure prior to resealing. Stomal assessment/size: not seen today. Peristomal assessment: not seen today.  Fungal overgrowth treated yesterday; patient also receiving diflucan systemically. Treatment options for stomal/peristomal skin: See above for yesterday's treatment, none today Output: soft brown stool Ostomy pouching: 1pc.soft convex with belt.  Education provided: See above.  Patient also provided with information regarding support group in [facility] for post discharge follow up.  She feels this will be helpful to her once she arrives home from her stay in Rehab.  Patient is a bit more encouraged today as she has heard several nurses and Nursing Techs describe the stories of  others who have "traveled her path".  Enrolled patient in [place] Discharge program: Yes, previously.  [place] nursing team will follow along while in house, and will remain available to this patient, the nursing, surgical and medical teams.  Note:  There will be no WOC nursing services available on Christmas Day (tomorrow).  Thanks, Maudie Flakes, MSN, RN, [place], Arther Abbott  Pager# [phone]

## 2018-03-26 NOTE — Progress Notes (Addendum)
Provided SNF bed offers to pt. Pt considering options and will make selection. [facility] Medicare authorization can be initiated once bed selection confirmed and insurance company re-open later this week (closed for holiday). Pt's pasrr # in level II- [place] must requested addt'l information (FL2, H7P, and MD note verifying pt's rehab stay expected to be short term- 30 days or less). CSW will provide documents to [facility] ([place] [facility] closed for holiday until 03/29/18)  Sharren Bridge, MSW, [place] Work 03/26/2018 [phone] coverage for [phone]

## 2018-03-27 LAB — GLUCOSE, CAPILLARY
Glucose-Capillary: 104 mg/dL — ABNORMAL HIGH (ref 70–99)
Glucose-Capillary: 122 mg/dL — ABNORMAL HIGH (ref 70–99)
Glucose-Capillary: 112 mg/dL — ABNORMAL HIGH (ref 70–99)
Glucose-Capillary: 134 mg/dL — ABNORMAL HIGH (ref 70–99)

## 2018-03-27 NOTE — Progress Notes (Signed)
Pt. Completed first ostomy dressing change with minimal assistance. Only issue is pt. Has a neurotransmitter in back that limits her from bend her neck for a long period of time. So there was some discomfort with the bending the neck. Patient also completed emptying the colostomy into the toilet without discomfort.

## 2018-03-27 NOTE — Progress Notes (Addendum)
[facility] Office:  [phone] General Surgery Progress Note   LOS: 7 days  POD -  7 Days Post-Op  Chief Complaint: Colovesical fistula  Assessment and Plan: 1.  LAPAROSCOPIC SIGMOIDECTOMY WITH COLOSTOMY LAPAROSCOPIC TAKE DOWN OF COLOVESICAL FISTULA CYSTOSCOPY WITH BILATERAL RETROGRADE AND BILATERAL URETERAL CATHETER PLACEMENT - 03/20/2018 - [facility]  Started ditropan for bladder spasms - this is much better  Otherwise doing well and awaiting SNF.  2.  Addison's disease - home dose steroids 3.  HTN 4.  Hypothyroidism 5.  Depression/Anxiety  Takes both Valium and Klonopin at home. So how the Klonopin was dropped.  I added the Klonopin back. 6.  Chronic atrial fibrillation - back on eliquis 7. CAD s/p CABG 8.  Chronic pain/lumbar radiculopathy 9.  Dysphagia/oral thrush - diflucan, nystatin mouthwash 10.  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  Bladder spasms better.  More of a waiting game now.  Son, Nahdia Doucet, is a Pharmacist, community in [place] who is to come by later.  Objective:   Vitals:   03/26/18 2112 03/27/18 0457  BP: (!) 148/72 134/62  Pulse: 60 (!) 57  Resp: 18 16  Temp: 98.5 F (36.9 C) 98.1 F (36.7 C)  SpO2: 96% 97%     Intake/Output from previous day:  12/24 0701 - 12/25 0700 In: 240 [P.O.:240] Out: 1735 [Urine:1720; Stool:15]  Intake/Output this shift:  No intake/output data recorded.   Physical Exam:   General: WN WF who is alert and oriented.    HEENT: Normal. Pupils equal. .   Lungs: Clear   Abdomen: Soft.  Has BS.   Wound: Ostomy LLQ.  Wounds okay.   Lab Results:    Recent Labs     03/24/18 1344 03/25/18 0434  WBC 9.8 7.9  HGB 11.8* 11.3*  HCT 36.4 35.5*  PLT 219 207    BMET   Recent Labs    03/24/18 2059 03/25/18 0434  NA 139 140  K 3.6 4.8  CL 104 103  CO2 29 28  GLUCOSE 140* 146*  BUN 19 18  CREATININE 0.90 0.68  CALCIUM 8.2* 8.4*    PT/INR  No results for input(s): LABPROT, INR in the last 72 hours.  ABG  No results for input(s): PHART, HCO3 in the last 72 hours.  Invalid input(s): PCO2, PO2   Studies/Results:  No results found.   Anti-infectives:   Anti-infectives (From admission, onward)   Start     Dose/Rate Route Frequency Ordered Stop   03/27/18 1000  fluconazole (DIFLUCAN) tablet 200 mg     200 mg Oral Daily 03/25/18 0858     03/25/18 1000  fluconazole (DIFLUCAN) IVPB 200 mg  Status:  Discontinued     200 mg 100 mL/hr over 60 Minutes Intravenous Every 24 hours 03/24/18 1245 03/25/18 0858   03/25/18 1000  fluconazole (DIFLUCAN) IVPB 200 mg     200 mg 100 mL/hr over 60 Minutes Intravenous Every 24 hours 03/25/18 0858 03/26/18 1146   03/24/18 1000  fluconazole (DIFLUCAN) IVPB 100 mg  Status:  Discontinued     100 mg 50 mL/hr over 60 Minutes Intravenous Every 24 hours 03/23/18 0859 03/24/18 1245   03/23/18  1000  fluconazole (DIFLUCAN) IVPB 200 mg     200 mg 100 mL/hr over 60 Minutes Intravenous Once 03/23/18 0859 03/23/18 1242   03/20/18 1400  neomycin (MYCIFRADIN) tablet 1,000 mg  Status:  Discontinued     1,000 mg Oral 3 times per day 03/20/18 0644 03/20/18 0645   03/20/18 1400  metroNIDAZOLE (FLAGYL) tablet 1,000 mg  Status:  Discontinued     1,000 mg Oral 3 times per day 03/20/18 0644 03/20/18 0645   03/20/18 0645  cefoTEtan (CEFOTAN) 2 g in sodium chloride 0.9 % 100 mL IVPB     2 g 200 mL/hr over 30 Minutes Intravenous On call to O.R. 03/20/18 0644 03/20/18 0840      Alphonsa Overall, MD, FACS Pager: [facility] Office: [phone] 03/27/2018

## 2018-03-28 LAB — GLUCOSE, CAPILLARY
GLUCOSE-CAPILLARY: 76 mg/dL (ref 70–99)
Glucose-Capillary: 104 mg/dL — ABNORMAL HIGH (ref 70–99)
Glucose-Capillary: 156 mg/dL — ABNORMAL HIGH (ref 70–99)
Glucose-Capillary: 157 mg/dL — ABNORMAL HIGH (ref 70–99)

## 2018-03-28 NOTE — Care Management Important Message (Signed)
Important Message  Patient Details  Name: [name] MRN: [id number] Date of Birth: 17-Jun-1940   Medicare Important Message Given:  Yes    [name] 03/28/2018, 9:17 AM

## 2018-03-28 NOTE — Progress Notes (Signed)
Physical Therapy Treatment Patient Details Name: [name] MRN: [id number] DOB: [birth year] Today's Date: 03/28/2018    History of Present Illness Pt is s/p laparoscopic-assisted sigmoidectomy, colostomy, takedown of fistula and bil ureteral stents.  PMH:  chronic back pain--neurostimulator; CABG, CAD, AFib, Addison's disease and HTN      PT Comments    OOB in recliner via OT. Assisted with amb in hallway however required need for a walker and limited distance due to pain and fatigue.  Prior pt was amb without any AD and was Indep with all activities of daily living.  Due to recent ABD surgery, pt will need ST Rehab at SNF prior to retunring home alone.     Follow Up Recommendations  SNF(pt will need ST Rehab to regain prior level of Indep mobility (no AD) )     Equipment Recommendations  None recommended by PT    Recommendations for Other Services       Precautions / Restrictions Precautions Precautions: Fall Precaution Comments: abd sx, L colostomy Restrictions Weight Bearing Restrictions: No    Mobility  Bed Mobility     Rolling: Supervision Sidelying to sit: Supervision       General bed mobility comments: OOB in recliner   Transfers Overall transfer level: Needs assistance Equipment used: Rolling walker (2 wheeled) Transfers: Sit to/from Stand Sit to Stand: Supervision;Min guard         General transfer comment: one VC safety with turns using walker and hand placement with stand to sit  Ambulation/Gait Ambulation/Gait assistance: Supervision;Min guard Gait Distance (Feet): 185 Feet Assistive device: Rolling walker (2 wheeled) Gait Pattern/deviations: Step-through pattern;Decreased stride length Gait velocity: decreased    General Gait Details: 25% VC's on safety with turns using walker.  slow gait with mild lean on walker due to increased ABD pain with increased activity.    Stairs             Wheelchair Mobility    Modified Rankin  (Stroke Patients Only)       Balance                                            Cognition Arousal/Alertness: Awake/alert Behavior During Therapy: WFL for tasks assessed/performed Overall Cognitive Status: Within Functional Limits for tasks assessed                                 General Comments: pleasant and motivated       Exercises      General Comments        Pertinent Vitals/Pain Pain Assessment: Faces Faces Pain Scale: Hurts little more Pain Location: abdomen after activity Pain Descriptors / Indicators: Discomfort;Sore;Tightness Pain Intervention(s): Monitored during session;Repositioned    Home Living                      Prior Function            PT Goals (current goals can now be found in the care plan section)      Frequency    Min 2X/week      PT Plan Current plan remains appropriate    Co-evaluation              AM-PAC PT "6 Clicks" Mobility   Outcome Measure  Help needed turning  from your back to your side while in a flat bed without using bedrails?: A Little Help needed moving from lying on your back to sitting on the side of a flat bed without using bedrails?: A Little Help needed moving to and from a bed to a chair (including a wheelchair)?: A Little Help needed standing up from a chair using your arms (e.g., wheelchair or bedside chair)?: A Little Help needed to walk in hospital room?: A Little Help needed climbing 3-5 steps with a railing? : A Lot 6 Click Score: 17    End of Session Equipment Utilized During Treatment: Gait belt Activity Tolerance: Patient limited by fatigue;Patient limited by pain Patient left: with call bell/phone within reach;in chair;with family/visitor present Nurse Communication: Mobility status PT Visit Diagnosis: Unsteadiness on feet (R26.81)     Time: 1033-1050 PT Time Calculation (min) (ACUTE ONLY): 17 min  Charges:  $Gait Training: 8-22 mins                      {Mouhamad Teed  PTA Acute  Rehabilitation [employer]      [phone] Office      [phone]

## 2018-03-28 NOTE — Consult Note (Signed)
[facility] Nurse ostomy follow up Stoma type/location: LUQ colostomy Stomal assessment/size: Not seen today Peristomal assessment: Not seen today Treatment options for stomal/peristomal skin: Skin barrier ring Output: soft brown stool Ostomy pouching: 1pc.soft convex with belt.  Education provided: Patient emptied pouch with assistance into toilet yesterday, cleaning tail closure with toilet tissue independently.  States it was "messy", but notes that she got through it. She is encouraged and reminded that practice will improve her ability in this skill set.  Patient also changed pouch with assistance of bedside RN and "felt pretty good about things".  Patient is praised for her persistence and embracing this change that she did not seek. Enrolled patient in [place] Start Discharge program: Yes, previously Awaiting SNF/Rehab placement. Next change planned for Friday if still in house.  [facility] nursing team will follow, and will remain available to this patient, the nursing, surgical and medical teams.   Thanks, Maudie Flakes, MSN, RN, [place], Arther Abbott  Pager# [phone]

## 2018-03-28 NOTE — Progress Notes (Signed)
Occupational Therapy Treatment Patient Details Name: [name] MRN: [id number] DOB: [birth year] Today's Date: 03/28/2018    History of present illness Pt is s/p laparoscopic-assisted sigmoidectomy, colostomy, takedown of fistula and bil ureteral stents.  PMH:  chronic back pain--neurostimulator; CABG, CAD, AFib, Addison's disease and HTN     OT comments  Performed ADL and educated on sock aide today.  Continue to recommend SNF as pt is home alone Progressing with OT  Follow Up Recommendations  SNF    Equipment Recommendations  None recommended by OT    Recommendations for Other Services      Precautions / Restrictions Precautions Precautions: Fall Precaution Comments: abd sx, L colostomy Restrictions Weight Bearing Restrictions: No       Mobility Bed Mobility     Rolling: Supervision Sidelying to sit: Supervision       General bed mobility comments: used bedrail  Transfers   Equipment used: Rolling walker (2 wheeled)   Sit to Stand: Supervision              Balance                                           ADL either performed or assessed with clinical judgement   ADL           Upper Body Bathing: Supervision/ safety;Standing   Lower Body Bathing: Minimal assistance;Sit to/from stand   Upper Body Dressing : Standing;Minimal assistance(tele lines)   Lower Body Dressing: Minimal assistance;Sit to/from stand   Toilet Transfer: Supervision/safety;Ambulation;Comfort height toilet             General ADL Comments: ambulated to bathroom and performed ADL. Educated on sock aide and used it with min A today     Manufacturing systems engineer      Cognition Arousal/Alertness: Awake/alert Behavior During Therapy: WFL for tasks assessed/performed Overall Cognitive Status: Within Functional Limits for tasks assessed                                          Exercises     Shoulder  Instructions       General Comments  Pt continues to have catheter in place    Pertinent Vitals/ Pain       Faces Pain Scale: Hurts little more Pain Location: abdomen, back Pain Descriptors / Indicators: Discomfort;Sore Pain Intervention(s): Limited activity within patient's tolerance;Monitored during session;Repositioned  Home Living                                          Prior Functioning/Environment              Frequency  Min 2X/week        Progress Toward Goals  OT Goals(current goals can now be found in the care plan section)  Progress towards OT goals: Progressing toward goals     Plan      Co-evaluation                 AM-PAC OT "6 Clicks" Daily Activity     Outcome Measure   Help from another person eating meals?:  None Help from another person taking care of personal grooming?: A Little Help from another person toileting, which includes using toliet, bedpan, or urinal?: A Little Help from another person bathing (including washing, rinsing, drying)?: A Little Help from another person to put on and taking off regular upper body clothing?: A Little Help from another person to put on and taking off regular lower body clothing?: A Little 6 Click Score: 19    End of Session        Activity Tolerance Patient tolerated treatment well   Patient Left     Nurse Communication          Time: 2440-1027 OT Time Calculation (min): 32 min  Charges: OT General Charges $OT Visit: 1 Visit OT Treatments $Self Care/Home Management : 23-37 mins  Lesle Chris, OTR/L Acute Rehabilitation Services [phone] [facility] pager [phone] office 03/28/2018   Portal 03/28/2018, 11:42 AM

## 2018-03-28 NOTE — Progress Notes (Signed)
[facility] Office:  [phone] General Surgery Progress Note   LOS: 8 days  POD -  8 Days Post-Op  Chief Complaint: Colovesical fistula  Assessment and Plan: 1.  LAPAROSCOPIC SIGMOIDECTOMY WITH COLOSTOMY LAPAROSCOPIC TAKE DOWN OF COLOVESICAL FISTULA CYSTOSCOPY WITH BILATERAL RETROGRADE AND BILATERAL URETERAL CATHETER PLACEMENT - 03/20/2018 - [facility]  Started ditropan for bladder spasms - this is much better  Looks good.  Awaiting SNF.  2.  Addison's disease - home dose steroids 3.  HTN 4.  Hypothyroidism 5.  Depression/Anxiety  Takes both Valium and Klonopin at home. So how the Klonopin was dropped.  I added the Klonopin back. 6.  Chronic atrial fibrillation - back on eliquis 7. CAD s/p CABG 8.  Chronic pain/lumbar radiculopathy 9.  Dysphagia/oral thrush - diflucan, nystatin mouthwash 10.  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  Awaiting SNF.  Son, Mulki Roesler, is a Pharmacist, community in [place] who is to come by later.  Objective:   Vitals:   03/27/18 2100 03/28/18 0530  BP: (!) 144/68 (!) 158/75  Pulse: 61 (!) 57  Resp: 16 20  Temp: 98.1 F (36.7 C) 97.9 F (36.6 C)  SpO2: 99% 95%     Intake/Output from previous day:  12/25 0701 - 12/26 0700 In: -  Out: 2400 [Urine:2400]  Intake/Output this shift:  No intake/output data recorded.   Physical Exam:   General: WN WF who is alert and oriented.    HEENT: Normal. Pupils equal. .   Lungs: Clear   Abdomen: Soft.  Has BS.   Wound: Ostomy LLQ.  Wounds okay.   Lab Results:    No results for input(s): WBC, HGB, HCT, PLT in the last 72 hours.  BMET   No results for  input(s): NA, K, CL, CO2, GLUCOSE, BUN, CREATININE, CALCIUM in the last 72 hours.  PT/INR  No results for input(s): LABPROT, INR in the last 72 hours.  ABG  No results for input(s): PHART, HCO3 in the last 72 hours.  Invalid input(s): PCO2, PO2   Studies/Results:  No results found.   Anti-infectives:   Anti-infectives (From admission, onward)   Start     Dose/Rate Route Frequency Ordered Stop   03/27/18 1000  fluconazole (DIFLUCAN) tablet 200 mg     200 mg Oral Daily 03/25/18 0858     03/25/18 1000  fluconazole (DIFLUCAN) IVPB 200 mg  Status:  Discontinued     200 mg 100 mL/hr over 60 Minutes Intravenous Every 24 hours 03/24/18 1245 03/25/18 0858   03/25/18 1000  fluconazole (DIFLUCAN) IVPB 200 mg     200 mg 100 mL/hr over 60 Minutes Intravenous Every 24 hours 03/25/18 0858 03/26/18 1146   03/24/18 1000  fluconazole (DIFLUCAN) IVPB 100 mg  Status:  Discontinued     100 mg 50 mL/hr over 60 Minutes Intravenous Every 24 hours 03/23/18 0859 03/24/18 1245   03/23/18 1000  fluconazole (DIFLUCAN) IVPB 200 mg     200 mg 100 mL/hr over 60 Minutes Intravenous Once 03/23/18 0859 03/23/18 1242   03/20/18 1400  neomycin (MYCIFRADIN) tablet 1,000 mg  Status:  Discontinued     1,000 mg Oral 3 times per day  03/20/18 0644 03/20/18 0645   03/20/18 1400  metroNIDAZOLE (FLAGYL) tablet 1,000 mg  Status:  Discontinued     1,000 mg Oral 3 times per day 03/20/18 0644 03/20/18 0645   03/20/18 0645  cefoTEtan (CEFOTAN) 2 g in sodium chloride 0.9 % 100 mL IVPB     2 g 200 mL/hr over 30 Minutes Intravenous On call to O.R. 03/20/18 0644 03/20/18 0840      Alphonsa Overall, MD, FACS Pager: [facility] Office: [phone] 03/28/2018

## 2018-03-29 LAB — GLUCOSE, CAPILLARY
GLUCOSE-CAPILLARY: 134 mg/dL — AB (ref 70–99)
Glucose-Capillary: 111 mg/dL — ABNORMAL HIGH (ref 70–99)
Glucose-Capillary: 169 mg/dL — ABNORMAL HIGH (ref 70–99)
Glucose-Capillary: 155 mg/dL — ABNORMAL HIGH (ref 70–99)

## 2018-03-29 NOTE — Progress Notes (Signed)
Subjective No acute events. Feeling well. Tolerating diet without n/v. Colostomy working well. Denies complaints.  Objective: Vital signs in last 24 hours: Temp:  [98 F (36.7 C)-98.4 F (36.9 C)] 98.3 F (36.8 C) (12/27 1779) Pulse Rate:  [61-73] 61 (12/27 0608) Resp:  [18-20] 18 (12/27 3903) BP: (150-156)/(74-94) 156/79 (12/27 0608) SpO2:  [96 %-98 %] 96 % (12/27 0608) Weight:  [88.2 kg] 88.2 kg (12/27 0500) Last BM Date: 03/29/18  Intake/Output from previous day: 12/26 0701 - 12/27 0700 In: 530 [P.O.:530] Out: 3250 [Urine:3150; Stool:100] Intake/Output this shift: No intake/output data recorded.  Gen: NAD, comfortable CV: RRR Pulm: Normal work of breathing Abd: Soft, NT/ND. Colostomy productive, brown stool in appliance. Ext: SCDs in place  Lab Results: CBC  No results for input(s): WBC, HGB, HCT, PLT in the last 72 hours. BMET No results for input(s): NA, K, CL, CO2, GLUCOSE, BUN, CREATININE, CALCIUM in the last 72 hours. PT/INR No results for input(s): LABPROT, INR in the last 72 hours. ABG No results for input(s): PHART, HCO3 in the last 72 hours.  Invalid input(s): PCO2, PO2  Studies/Results:  Anti-infectives: Anti-infectives (From admission, onward)   Start     Dose/Rate Route Frequency Ordered Stop   03/27/18 1000  fluconazole (DIFLUCAN) tablet 200 mg     200 mg Oral Daily 03/25/18 0858     03/25/18 1000  fluconazole (DIFLUCAN) IVPB 200 mg  Status:  Discontinued     200 mg 100 mL/hr over 60 Minutes Intravenous Every 24 hours 03/24/18 1245 03/25/18 0858   03/25/18 1000  fluconazole (DIFLUCAN) IVPB 200 mg     200 mg 100 mL/hr over 60 Minutes Intravenous Every 24 hours 03/25/18 0858 03/26/18 1146   03/24/18 1000  fluconazole (DIFLUCAN) IVPB 100 mg  Status:  Discontinued     100 mg 50 mL/hr over 60 Minutes Intravenous Every 24 hours 03/23/18 0859 03/24/18 1245   03/23/18 1000  fluconazole (DIFLUCAN) IVPB 200 mg     200 mg 100 mL/hr over 60 Minutes  Intravenous Once 03/23/18 0859 03/23/18 1242   03/20/18 1400  neomycin (MYCIFRADIN) tablet 1,000 mg  Status:  Discontinued     1,000 mg Oral 3 times per day 03/20/18 0644 03/20/18 0645   03/20/18 1400  metroNIDAZOLE (FLAGYL) tablet 1,000 mg  Status:  Discontinued     1,000 mg Oral 3 times per day 03/20/18 0644 03/20/18 0645   03/20/18 0645  cefoTEtan (CEFOTAN) 2 g in sodium chloride 0.9 % 100 mL IVPB     2 g 200 mL/hr over 30 Minutes Intravenous On call to O.R. 03/20/18 0644 03/20/18 0840       Assessment/Plan: Patient Active Problem List   Diagnosis Date Noted  . Mesenteric mass (lipoma) s/p excision 03/20/2018 03/25/2018  . Diverticulitis of sigmoid colon 03/25/2018  . S/P laparoscopic-assisted sigmoidectomy 03/20/2018  . Colonic mass 03/20/2018  . History of adenomatous polyp of colon   . Polyp of ascending colon   . 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 Orthostatic hypotension 09/24/2017  . Visit for monitoring Tikosyn therapy 07/23/2017  . Depression 06/15/2017  . Acute respiratory failure with hypoxia ([place])   . Hypothyroidism   . Bronchitis 03/22/2016  . Abnormal CT of the chest 03/16/2016  . Coronary artery disease involving coronary bypass graft of native heart with angina pectoris ([place]) 03/16/2016  . Chronic anticoagulation 10/06/2015  . On dofetilide therapy 10/06/2015  . Atrial fibrillation ([place]) 09/15/2015  . Snoring 02/17/2015  .  Chronic low back pain 11/09/2014  . Disc degeneration, lumbar 11/09/2014  . Addison disease ([place]) 09/30/2014  . Chronic back pain 09/30/2014  . Hematochezia-(Hgb stable) 09/03/2014  . DOE (dyspnea on exertion) 09/01/2014  . Lumbar radiculopathy 04/27/2014  . Osteoarthritis 11/18/2008  . Hyperlipidemia 11/17/2008  . Essential hypertension 11/17/2008   s/p Procedure(s): LAPAROSCOPIC SIGMOIDECTOMY WITH COLOSTOMY LAPAROSCOPIC TAKE DOWN OF COLOVESICAL FISTULA CYSTOSCOPY WITH BILATERAL  RETROGRADE AND BILATERAL URETERAL CATHETER PLACEMENT 03/20/2018  -On home dose steroids for Addison's; stable -On home Eliquis for chronic atrial fibrillation -Oral thrush - on diflucan and nystatin mouthwash - resolving -PPx: Eliquis, SCDs -Dispo: Pending SNF  LOS: 9 days   Saphire Barnhart M. Dema Severin, M.D. General and Colorectal Surgery [facility]

## 2018-03-29 NOTE — Progress Notes (Signed)
LCSW faxed additional info requested for PASRR.   Patient waiting on PASRR and auth.   LCSW will continue to follow.   [name]

## 2018-03-29 NOTE — Progress Notes (Signed)
Physical Therapy Treatment Patient Details Name: [name] MRN: [id number] DOB: 19-Jul-1940 Today's Date: 03/29/2018    History of Present Illness Pt is s/p laparoscopic-assisted sigmoidectomy, colostomy, takedown of fistula and bil ureteral stents.  PMH:  chronic back pain--neurostimulator; CABG, CAD, AFib, Addison's disease and HTN      PT Comments    Pt progressing slowly with mobility limitations due to recent ABD surgery.  Pt requires a assist with lower body dressing and getting in/out bed.  Gait improving however demonstrated excessive lean on walker to ABD discomfort.  Gait is slower and balance is unsteady despite increased distance.  Prior pt was indep and living alone.  Pt will need ST Rehab at SNF prior to safely D/C back to home.   Follow Up Recommendations  SNF(pt will need ST Rehab to regain prior level of Indep )     Equipment Recommendations  None recommended by PT    Recommendations for Other Services       Precautions / Restrictions Precautions Precautions: Fall Precaution Comments: abd sx, L colostomy, foley cath Restrictions Weight Bearing Restrictions: No    Mobility  Bed Mobility               General bed mobility comments: OOB in recliner   Transfers Overall transfer level: Needs assistance Equipment used: Rolling walker (2 wheeled) Transfers: Sit to/from Stand Sit to Stand: Supervision;Min guard         General transfer comment: one VC safety with turns using walker and hand placement with stand to sit  Ambulation/Gait Ambulation/Gait assistance: Supervision;Min guard Gait Distance (Feet): 225 Feet Assistive device: Rolling walker (2 wheeled) Gait Pattern/deviations: Step-through pattern;Decreased stride length Gait velocity: decreased    General Gait Details: 25% VC's on safety with turns using walker.  slow gait with mild lean on walker due to increased ABD pain with increased activity.  Unsteady.  HIGH FALL  RISK   Stairs             Wheelchair Mobility    Modified Rankin (Stroke Patients Only)       Balance                                            Cognition Arousal/Alertness: Awake/alert Behavior During Therapy: WFL for tasks assessed/performed Overall Cognitive Status: Within Functional Limits for tasks assessed                                 General Comments: pleasant and motivated       Exercises      General Comments        Pertinent Vitals/Pain Pain Assessment: Faces Faces Pain Scale: Hurts even more Pain Location: abdomen after activity getting back into bed Pain Descriptors / Indicators: Discomfort;Sore;Tightness;Grimacing;Operative site guarding Pain Intervention(s): Monitored during session;Repositioned    Home Living                      Prior Function            PT Goals (current goals can now be found in the care plan section) Progress towards PT goals: Progressing toward goals    Frequency    Min 2X/week      PT Plan Current plan remains appropriate    Co-evaluation  AM-PAC PT "6 Clicks" Mobility   Outcome Measure  Help needed turning from your back to your side while in a flat bed without using bedrails?: A Little Help needed moving from lying on your back to sitting on the side of a flat bed without using bedrails?: A Little Help needed moving to and from a bed to a chair (including a wheelchair)?: A Little Help needed standing up from a chair using your arms (e.g., wheelchair or bedside chair)?: A Little Help needed to walk in hospital room?: A Little Help needed climbing 3-5 steps with a railing? : A Little 6 Click Score: 18    End of Session Equipment Utilized During Treatment: Gait belt Activity Tolerance: Patient limited by fatigue;Patient limited by pain Patient left: with call bell/phone within reach;in chair;with family/visitor present Nurse Communication:  Mobility status PT Visit Diagnosis: Unsteadiness on feet (R26.81)     Time: 2426-8341 PT Time Calculation (min) (ACUTE ONLY): 20 min  Charges:  $Gait Training: 8-22 mins                     Rica Koyanagi  PTA Acute  Rehabilitation Services Pager      [phone] Office      [phone]

## 2018-03-29 NOTE — Consult Note (Signed)
[facility] Nurse ostomy follow up Stoma type/location: LUQ colostomy Stomal assessment/size: oval, flush, os at 12 o'clock in crease Peristomal assessment: resolving fungal overgrowth, irritant contact dermatitis from 11-1 o'clock is reepithelializing. Treatment options for stomal/peristomal skin: dusted with antifungal powder (2% miconazole), brushed away excess.  Skin barrier ring, soft convex pouch. Patient must wear belt. Output: soft brown stool Ostomy pouching: 1pc cut-to-fit soft convex with skin barrier ring  Education provided: Patient cut out pattern, covers newly applied pouch with hands for gentle warming hand pressure, removes tape borders. Assists to apply belt. Patient is gaining independence with pouch emptying. Enrolled patient in [place] Discharge program: Yes, previously by my partner, M. Austin.  Currently, pouches are requiring changing every 48-72 hours.  [name] nursing team will follow, and will remain available to this patient, the nursing, surgical and medical teams.   Thanks, Maudie Flakes, MSN, RN, [place], Arther Abbott  Pager# [phone]

## 2018-03-30 LAB — GLUCOSE, CAPILLARY
Glucose-Capillary: 93 mg/dL (ref 70–99)
Glucose-Capillary: 131 mg/dL — ABNORMAL HIGH (ref 70–99)
Glucose-Capillary: 137 mg/dL — ABNORMAL HIGH (ref 70–99)
Glucose-Capillary: 215 mg/dL — ABNORMAL HIGH (ref 70–99)

## 2018-03-30 MED ORDER — PSYLLIUM 95 % PO PACK
1.0000 | PACK | Freq: Every day | ORAL | Status: DC
  Administered 2018-03-30 – 2018-03-31 (×2): 1 via ORAL
  Filled 2018-03-30 (×4): qty 1

## 2018-03-30 NOTE — Plan of Care (Signed)
Patient ambulated in hallways several times during 7 a to 7 p shift, denies pain, tolerating diet.  Colostomy putting out soft stool, patient able to empty colostomy bag herself.  Multiple friends in to visit.

## 2018-03-30 NOTE — Progress Notes (Signed)
Subjective No acute events. Feeling well. Tolerating diet without n/v. Colostomy working well - output decreased overnight but stool in bag this morning. Denies complaints aside from having indewlling foley.  Objective: Vital signs in last 24 hours: Temp:  [98.3 F (36.8 C)-98.7 F (37.1 C)] 98.3 F (36.8 C) (12/28 0447) Pulse Rate:  [55-65] 55 (12/28 0447) Resp:  [16-17] 16 (12/28 0447) BP: (131-144)/(51-67) 144/67 (12/28 0447) SpO2:  [95 %-96 %] 96 % (12/28 0447) Weight:  [93.4 kg] 93.4 kg (12/28 0447) Last BM Date: 03/29/18  Intake/Output from previous day: 12/27 0701 - 12/28 0700 In: 240 [P.O.:240] Out: 2150 [Urine:1850; Stool:300] Intake/Output this shift: No intake/output data recorded.  Gen: NAD, comfortable CV: RRR Pulm: Normal work of breathing Abd: Soft, NT/ND. Colostomy productive, brown stool in appliance. Ext: SCDs in place  Lab Results: CBC  No results for input(s): WBC, HGB, HCT, PLT in the last 72 hours. BMET No results for input(s): NA, K, CL, CO2, GLUCOSE, BUN, CREATININE, CALCIUM in the last 72 hours. PT/INR No results for input(s): LABPROT, INR in the last 72 hours. ABG No results for input(s): PHART, HCO3 in the last 72 hours.  Invalid input(s): PCO2, PO2  Studies/Results:  Anti-infectives: Anti-infectives (From admission, onward)   Start     Dose/Rate Route Frequency Ordered Stop   03/27/18 1000  fluconazole (DIFLUCAN) tablet 200 mg     200 mg Oral Daily 03/25/18 0858     03/25/18 1000  fluconazole (DIFLUCAN) IVPB 200 mg  Status:  Discontinued     200 mg 100 mL/hr over 60 Minutes Intravenous Every 24 hours 03/24/18 1245 03/25/18 0858   03/25/18 1000  fluconazole (DIFLUCAN) IVPB 200 mg     200 mg 100 mL/hr over 60 Minutes Intravenous Every 24 hours 03/25/18 0858 03/26/18 1146   03/24/18 1000  fluconazole (DIFLUCAN) IVPB 100 mg  Status:  Discontinued     100 mg 50 mL/hr over 60 Minutes Intravenous Every 24 hours 03/23/18 0859 03/24/18 1245    03/23/18 1000  fluconazole (DIFLUCAN) IVPB 200 mg     200 mg 100 mL/hr over 60 Minutes Intravenous Once 03/23/18 0859 03/23/18 1242   03/20/18 1400  neomycin (MYCIFRADIN) tablet 1,000 mg  Status:  Discontinued     1,000 mg Oral 3 times per day 03/20/18 0644 03/20/18 0645   03/20/18 1400  metroNIDAZOLE (FLAGYL) tablet 1,000 mg  Status:  Discontinued     1,000 mg Oral 3 times per day 03/20/18 0644 03/20/18 0645   03/20/18 0645  cefoTEtan (CEFOTAN) 2 g in sodium chloride 0.9 % 100 mL IVPB     2 g 200 mL/hr over 30 Minutes Intravenous On call to O.R. 03/20/18 0644 03/20/18 0840       Assessment/Plan: Patient Active Problem List   Diagnosis Date Noted  . Mesenteric mass (lipoma) s/p excision 03/20/2018 03/25/2018  . Diverticulitis of sigmoid colon 03/25/2018  . S/P laparoscopic-assisted sigmoidectomy 03/20/2018  . Colonic mass 03/20/2018  . History of adenomatous polyp of colon   . Polyp of ascending colon   . 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 Orthostatic hypotension 09/24/2017  . Visit for monitoring Tikosyn therapy 07/23/2017  . Depression 06/15/2017  . Acute respiratory failure with hypoxia ([place])   . Hypothyroidism   . Bronchitis 03/22/2016  . Abnormal CT of the chest 03/16/2016  . Coronary artery disease involving coronary bypass graft of native heart with angina pectoris ([place]) 03/16/2016  . Chronic anticoagulation 10/06/2015  .  On dofetilide therapy 10/06/2015  . Atrial fibrillation ([place]) 09/15/2015  . Snoring 02/17/2015  . Chronic low back pain 11/09/2014  . Disc degeneration, lumbar 11/09/2014  . Addison disease ([place]) 09/30/2014  . Chronic back pain 09/30/2014  . Hematochezia-(Hgb stable) 09/03/2014  . DOE (dyspnea on exertion) 09/01/2014  . Lumbar radiculopathy 04/27/2014  . Osteoarthritis 11/18/2008  . Hyperlipidemia 11/17/2008  . Essential hypertension 11/17/2008   s/p Procedure(s): LAPAROSCOPIC SIGMOIDECTOMY  WITH COLOSTOMY LAPAROSCOPIC TAKE DOWN OF COLOVESICAL FISTULA CYSTOSCOPY WITH BILATERAL RETROGRADE AND BILATERAL URETERAL CATHETER PLACEMENT 03/20/2018  -On home dose steroids for Addison's; stable -On home Eliquis for chronic atrial fibrillation -Oral thrush - on diflucan and nystatin mouthwash - resolving -If remains in house Monday, will plan cystogram and possible foley removal at that time if clear -PPx: Eliquis, SCDs -Dispo: Pending SNF  LOS: 10 days   Sharon Mt. Dema Severin, M.D. General and Colorectal Surgery [facility]

## 2018-03-31 LAB — GLUCOSE, CAPILLARY
GLUCOSE-CAPILLARY: 114 mg/dL — AB (ref 70–99)
Glucose-Capillary: 112 mg/dL — ABNORMAL HIGH (ref 70–99)
Glucose-Capillary: 177 mg/dL — ABNORMAL HIGH (ref 70–99)
Glucose-Capillary: 120 mg/dL — ABNORMAL HIGH (ref 70–99)

## 2018-03-31 NOTE — Progress Notes (Addendum)
Patient request LUQ colostomy pouch to be changed. Pt encouraged to empty instead of change. Education provided regarding [facility] RN recommendation as well as maintaining skin integrity. Pt was not agreeable to this plan and requested pouch to be changed.  Assisted pt with pouch change.

## 2018-03-31 NOTE — Progress Notes (Signed)
Subjective No acute Subjective Feeling well. Tolerating diet without n/v. Colostomy working well - output picked back up. Denies complaints  Objective: Vital signs in last 24 hours: Temp:  [97.9 F (36.6 C)-98.5 F (36.9 C)] 97.9 F (36.6 C) (12/29 0458) Pulse Rate:  [63-77] 63 (12/29 0458) Resp:  [17-18] 17 (12/29 0458) BP: (126-185)/(55-87) 185/87 (12/29 0458) SpO2:  [94 %-96 %] 96 % (12/29 0458) Weight:  [93.6 kg] 93.6 kg (12/29 0700) Last BM Date: 03/30/18  Intake/Output from previous day: 12/28 0701 - 12/29 0700 In: -  Out: 1850 [Urine:1750; Stool:100] Intake/Output this shift: No intake/output data recorded.  Gen: NAD, comfortable CV: RRR Pulm: Normal work of breathing Abd: Soft, NT/ND. Colostomy productive, brown stool in appliance. Ext: SCDs in place  Lab Results: CBC  No results for input(s): WBC, HGB, HCT, PLT in the last 72 hours. BMET No results for input(s): NA, K, CL, CO2, GLUCOSE, BUN, CREATININE, CALCIUM in the last 72 hours. PT/INR No results for input(s): LABPROT, INR in the last 72 hours. ABG No results for input(s): PHART, HCO3 in the last 72 hours.  Invalid input(s): PCO2, PO2  Studies/Results:  Anti-infectives: Anti-infectives (From admission, onward)   Start     Dose/Rate Route Frequency Ordered Stop   03/27/18 1000  fluconazole (DIFLUCAN) tablet 200 mg     200 mg Oral Daily 03/25/18 0858     03/25/18 1000  fluconazole (DIFLUCAN) IVPB 200 mg  Status:  Discontinued     200 mg 100 mL/hr over 60 Minutes Intravenous Every 24 hours 03/24/18 1245 03/25/18 0858   03/25/18 1000  fluconazole (DIFLUCAN) IVPB 200 mg     200 mg 100 mL/hr over 60 Minutes Intravenous Every 24 hours 03/25/18 0858 03/26/18 1146   03/24/18 1000  fluconazole (DIFLUCAN) IVPB 100 mg  Status:  Discontinued     100 mg 50 mL/hr over 60 Minutes Intravenous Every 24 hours 03/23/18 0859 03/24/18 1245   03/23/18 1000  fluconazole (DIFLUCAN) IVPB 200 mg     200 mg 100 mL/hr  over 60 Minutes Intravenous Once 03/23/18 0859 03/23/18 1242   03/20/18 1400  neomycin (MYCIFRADIN) tablet 1,000 mg  Status:  Discontinued     1,000 mg Oral 3 times per day 03/20/18 0644 03/20/18 0645   03/20/18 1400  metroNIDAZOLE (FLAGYL) tablet 1,000 mg  Status:  Discontinued     1,000 mg Oral 3 times per day 03/20/18 0644 03/20/18 0645   03/20/18 0645  cefoTEtan (CEFOTAN) 2 g in sodium chloride 0.9 % 100 mL IVPB     2 g 200 mL/hr over 30 Minutes Intravenous On call to O.R. 03/20/18 0644 03/20/18 0840       Assessment/Plan: Patient Active Problem List   Diagnosis Date Noted  . Mesenteric mass (lipoma) s/p excision 03/20/2018 03/25/2018  . Diverticulitis of sigmoid colon 03/25/2018  . S/P laparoscopic-assisted sigmoidectomy 03/20/2018  . Colonic mass 03/20/2018  . History of adenomatous polyp of colon   . Polyp of ascending colon   . 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 Orthostatic hypotension 09/24/2017  . Visit for monitoring Tikosyn therapy 07/23/2017  . Depression 06/15/2017  . Acute respiratory failure with hypoxia ([place])   . Hypothyroidism   . Bronchitis 03/22/2016  . Abnormal CT of the chest 03/16/2016  . Coronary artery disease involving coronary bypass graft of native heart with angina pectoris ([place]) 03/16/2016  . Chronic anticoagulation 10/06/2015  . On dofetilide therapy 10/06/2015  . Atrial fibrillation ([place])  09/15/2015  . Snoring 02/17/2015  . Chronic low back pain 11/09/2014  . Disc degeneration, lumbar 11/09/2014  . Addison disease ([place]) 09/30/2014  . Chronic back pain 09/30/2014  . Hematochezia-(Hgb stable) 09/03/2014  . DOE (dyspnea on exertion) 09/01/2014  . Lumbar radiculopathy 04/27/2014  . Osteoarthritis 11/18/2008  . Hyperlipidemia 11/17/2008  . Essential hypertension 11/17/2008   s/p Procedure(s): LAPAROSCOPIC SIGMOIDECTOMY WITH COLOSTOMY LAPAROSCOPIC TAKE DOWN OF COLOVESICAL FISTULA CYSTOSCOPY WITH  BILATERAL RETROGRADE AND BILATERAL URETERAL CATHETER PLACEMENT 03/20/2018  -On home dose steroids for Addison's; stable -On home Eliquis for chronic atrial fibrillation -Oral thrush - on diflucan and nystatin mouthwash - resolving -If remains in house Monday, will plan cystogram and possible foley removal at that time if clear -PPx: Eliquis, SCDs -Dispo: Pending SNF/insurance approval   LOS: 11 days   Sharon Mt. Dema Severin, M.D. General and Colorectal Surgery [facility]

## 2018-03-31 NOTE — Progress Notes (Signed)
Patient made aware that colostomy was about a quarter full. Primary RN request to empty colostomy pouch. Pt request for RN to return. 30 minutes later pt placed call to the desk reporting colostomy pouch was leaking. When RN arrived to the room the pouch had been removed. New pouch applied.

## 2018-04-01 ENCOUNTER — Inpatient Hospital Stay (HOSPITAL_COMMUNITY): Payer: Medicare Other

## 2018-04-01 LAB — GLUCOSE, CAPILLARY
Glucose-Capillary: 123 mg/dL — ABNORMAL HIGH (ref 70–99)
Glucose-Capillary: 92 mg/dL (ref 70–99)
Glucose-Capillary: 159 mg/dL — ABNORMAL HIGH (ref 70–99)
Glucose-Capillary: 141 mg/dL — ABNORMAL HIGH (ref 70–99)

## 2018-04-01 MED ORDER — IOTHALAMATE MEGLUMINE 17.2 % UR SOLN
300.0000 mL | Freq: Once | URETHRAL | Status: AC | PRN
  Administered 2018-04-01: 300 mL via INTRAVESICAL

## 2018-04-01 MED ORDER — DIATRIZOATE MEGLUMINE 30 % UR SOLN: Freq: Once | URETHRAL | Status: DC | PRN

## 2018-04-01 NOTE — Progress Notes (Signed)
Subjective No acute Subjective Feeling well. Tolerating diet without n/v. Colostomy working well - had leak from bag yesterday. Denies complaints  Objective: Vital signs in last 24 hours: Temp:  [98.1 F (36.7 C)-98.7 F (37.1 C)] 98.7 F (37.1 C) (12/30 0540) Pulse Rate:  [61-65] 63 (12/30 0540) Resp:  [18] 18 (12/30 0540) BP: (121-147)/(55-83) 147/83 (12/30 0540) SpO2:  [94 %-98 %] 94 % (12/30 0540) Last BM Date: 03/31/18  Intake/Output from previous day: 12/29 0701 - 12/30 0700 In: 360 [P.O.:360] Out: 2100 [Urine:1900; Stool:200] Intake/Output this shift: No intake/output data recorded.  Gen: NAD, comfortable CV: RRR Pulm: Normal work of breathing Abd: Soft, NT/ND. Colostomy productive, brown stool in appliance. Ext: SCDs in place  Lab Results: CBC  No results for input(s): WBC, HGB, HCT, PLT in the last 72 hours. BMET No results for input(s): NA, K, CL, CO2, GLUCOSE, BUN, CREATININE, CALCIUM in the last 72 hours. PT/INR No results for input(s): LABPROT, INR in the last 72 hours. ABG No results for input(s): PHART, HCO3 in the last 72 hours.  Invalid input(s): PCO2, PO2  Studies/Results:  Anti-infectives: Anti-infectives (From admission, onward)   Start     Dose/Rate Route Frequency Ordered Stop   03/27/18 1000  fluconazole (DIFLUCAN) tablet 200 mg     200 mg Oral Daily 03/25/18 0858     03/25/18 1000  fluconazole (DIFLUCAN) IVPB 200 mg  Status:  Discontinued     200 mg 100 mL/hr over 60 Minutes Intravenous Every 24 hours 03/24/18 1245 03/25/18 0858   03/25/18 1000  fluconazole (DIFLUCAN) IVPB 200 mg     200 mg 100 mL/hr over 60 Minutes Intravenous Every 24 hours 03/25/18 0858 03/26/18 1146   03/24/18 1000  fluconazole (DIFLUCAN) IVPB 100 mg  Status:  Discontinued     100 mg 50 mL/hr over 60 Minutes Intravenous Every 24 hours 03/23/18 0859 03/24/18 1245   03/23/18 1000  fluconazole (DIFLUCAN) IVPB 200 mg     200 mg 100 mL/hr over 60 Minutes Intravenous  Once 03/23/18 0859 03/23/18 1242   03/20/18 1400  neomycin (MYCIFRADIN) tablet 1,000 mg  Status:  Discontinued     1,000 mg Oral 3 times per day 03/20/18 0644 03/20/18 0645   03/20/18 1400  metroNIDAZOLE (FLAGYL) tablet 1,000 mg  Status:  Discontinued     1,000 mg Oral 3 times per day 03/20/18 0644 03/20/18 0645   03/20/18 0645  cefoTEtan (CEFOTAN) 2 g in sodium chloride 0.9 % 100 mL IVPB     2 g 200 mL/hr over 30 Minutes Intravenous On call to O.R. 03/20/18 0644 03/20/18 0840       Assessment/Plan: Patient Active Problem List   Diagnosis Date Noted  . Mesenteric mass (lipoma) s/p excision 03/20/2018 03/25/2018  . Diverticulitis of sigmoid colon 03/25/2018  . S/P laparoscopic-assisted sigmoidectomy 03/20/2018  . Colonic mass 03/20/2018  . History of adenomatous polyp of colon   . Polyp of ascending colon   . 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 Orthostatic hypotension 09/24/2017  . Visit for monitoring Tikosyn therapy 07/23/2017  . Depression 06/15/2017  . Acute respiratory failure with hypoxia ([place])   . Hypothyroidism   . Bronchitis 03/22/2016  . Abnormal CT of the chest 03/16/2016  . Coronary artery disease involving coronary bypass graft of native heart with angina pectoris ([place]) 03/16/2016  . Chronic anticoagulation 10/06/2015  . On dofetilide therapy 10/06/2015  . Atrial fibrillation ([place]) 09/15/2015  . Snoring 02/17/2015  .  Chronic low back pain 11/09/2014  . Disc degeneration, lumbar 11/09/2014  . Addison disease ([place]) 09/30/2014  . Chronic back pain 09/30/2014  . Hematochezia-(Hgb stable) 09/03/2014  . DOE (dyspnea on exertion) 09/01/2014  . Lumbar radiculopathy 04/27/2014  . Osteoarthritis 11/18/2008  . Hyperlipidemia 11/17/2008  . Essential hypertension 11/17/2008   s/p Procedure(s): LAPAROSCOPIC SIGMOIDECTOMY WITH COLOSTOMY LAPAROSCOPIC TAKE DOWN OF COLOVESICAL FISTULA CYSTOSCOPY WITH BILATERAL RETROGRADE AND  BILATERAL URETERAL CATHETER PLACEMENT 03/20/2018  -On home dose steroids for Addison's; stable -On home Eliquis for chronic atrial fibrillation -Oral thrush - on diflucan and nystatin mouthwash - resolving -Cystogram today; if normal in appearance, will plan foley removal today -PPx: Eliquis, SCDs -Dispo: Pending SNF/insurance approval   LOS: 12 days   Sharon Mt. Dema Severin, M.D. General and Colorectal Surgery [facility]

## 2018-04-01 NOTE — Progress Notes (Signed)
Occupational Therapy Treatment Patient Details Name: [name] MRN: [id number] DOB: 04-25-1940 Today's Date: 04/01/2018    History of present illness Pt is s/p laparoscopic-assisted sigmoidectomy, colostomy, takedown of fistula and bil ureteral stents.  PMH:  chronic back pain--neurostimulator; CABG, CAD, AFib, Addison's disease and HTN     OT comments  Pt making good progress with functional goals. OT will continue to follow  Follow Up Recommendations  SNF    Equipment Recommendations  None recommended by OT    Recommendations for Other Services      Precautions / Restrictions Precautions Precautions: Fall Restrictions Weight Bearing Restrictions: No       Mobility Bed Mobility               General bed mobility comments: OOB in recliner   Transfers Overall transfer level: Needs assistance Equipment used: Rolling walker (2 wheeled);None Transfers: Sit to/from Stand Sit to Stand: Supervision;Min guard              Balance Overall balance assessment: Mild deficits observed, not formally tested                                         ADL either performed or assessed with clinical judgement   ADL Overall ADL's : Needs assistance/impaired     Grooming: Standing;Wash/dry hands;Wash/dry face;Supervision/safety;Min guard                   Toilet Transfer: Supervision/safety;Ambulation;Comfort height toilet;Grab bars;Min guard   Toileting- Clothing Manipulation and Hygiene: Supervision/safety;Sit to/from stand       Functional mobility during ADLs: Min guard;Supervision/safety       Vision Baseline Vision/History: Wears glasses Patient Visual Report: No change from baseline     Perception     Praxis      Cognition Arousal/Alertness: Awake/alert Behavior During Therapy: WFL for tasks assessed/performed Overall Cognitive Status: Within Functional Limits for tasks assessed                                           Exercises     Shoulder Instructions       General Comments      Pertinent Vitals/ Pain       Pain Assessment: No/denies pain Pain Intervention(s): Monitored during session  Home Living                                          Prior Functioning/Environment              Frequency  Min 2X/week        Progress Toward Goals  OT Goals(current goals can now be found in the care plan section)  Progress towards OT goals: Progressing toward goals     Plan      Co-evaluation                 AM-PAC OT "6 Clicks" Daily Activity     Outcome Measure   Help from another person eating meals?: None Help from another person taking care of personal grooming?: A Little Help from another person toileting, which includes using toliet, bedpan, or urinal?: A Little Help from another person bathing (including  washing, rinsing, drying)?: A Little Help from another person to put on and taking off regular upper body clothing?: None Help from another person to put on and taking off regular lower body clothing?: A Little 6 Click Score: 20    End of Session    OT Visit Diagnosis: Muscle weakness (generalized) (M62.81);Unsteadiness on feet (R26.81)   Activity Tolerance Patient tolerated treatment well   Patient Left in chair;with call bell/phone within reach;with nursing/sitter in room   Nurse Communication          Time: 2060-1561 OT Time Calculation (min): 14 min  Charges: OT Treatments $Therapeutic Activity: 23-37 mins     Britt Bottom 04/01/2018, 1:52 PM

## 2018-04-01 NOTE — Consult Note (Addendum)
[place] Nurse ostomy follow up Stoma type/location: LUQ colostomy Stomal assessment/size: oval, flush.  Last seen by this writer on Friday, 12/27.  Patient changed pouch with Bedside RN on Sunday, 12/29 (they forgot to place skin barrier ring). Peristomal assessment: Not seen today Treatment options for stomal/peristomal skin: used antifungal powder and crusted with skin barrier wipe yesterday.  Rash is resolved, they report. Output: brown stol Ostomy pouching: 1pc.soft convex with skin barrier ring  Education provided: 1-page educational instruction sheet for pouch change provided along with 3 additional pouches and 3 additional skin barrier rings. Enrolled patient in [place] Discharge program: Yes, previously.  Changes are currently every other day and patient is not yet independent; requires assistance to place pouch and recall steps. Note: Patient must wear belt.  [place] nursing team will follow, and will remain available to this patient, the, surgical, nursing and medical teams while in house. SNF placement is depending on insurance coverage.  Thanks, Maudie Flakes, MSN, RN, [place], Arther Abbott  Pager# [phone]

## 2018-04-01 NOTE — Progress Notes (Signed)
Physical Therapy Treatment Patient Details Name: [name] MRN: [id number] DOB: 1940-12-23 Today's Date: 04/01/2018    History of Present Illness Pt is s/p laparoscopic-assisted sigmoidectomy, colostomy, takedown of fistula and bil ureteral stents.  PMH:  chronic back pain--neurostimulator; CABG, CAD, AFib, Addison's disease and HTN      PT Comments    Pt reports she is anticipating having her foley removed and d/c to SNF.  Pt steady with ambulating with RW however high fall risk if pt has no UE support.  Pt performed LE exercises, mostly in standing to also challenge her balance.  Pt typically independent at baseline and does not feel at her prior level of functioning yet.   Follow Up Recommendations  SNF     Equipment Recommendations  None recommended by PT    Recommendations for Other Services       Precautions / Restrictions Precautions Precautions: Fall Precaution Comments: abd sx, L colostomy Restrictions Weight Bearing Restrictions: No    Mobility  Bed Mobility Overal bed mobility: Modified Independent             General bed mobility comments: OOB in recliner   Transfers Overall transfer level: Needs assistance Equipment used: Rolling walker (2 wheeled) Transfers: Sit to/from Stand Sit to Stand: Min guard         General transfer comment: min/guard for safety, unsteady with initial standing however pt self corrected  Ambulation/Gait Ambulation/Gait assistance: Min guard Gait Distance (Feet): 225 Feet Assistive device: Rolling walker (2 wheeled) Gait Pattern/deviations: Step-through pattern;Decreased stride length     General Gait Details: steady with RW 100 feet, had pt return to room without UE support to challenge balance, pt with stiff upper body and cautious gait without RW support, recommended pt use RW for mobility at this time (will trial without in therapy for safety)   Stairs             Wheelchair Mobility    Modified  [place] (Stroke Patients Only)       Balance Overall balance assessment: Mild deficits observed, not formally tested             Standing balance comment: pt very unsteady with challenges to balance without UE support,  required UE support for SLS activity                            Cognition Arousal/Alertness: Awake/alert Behavior During Therapy: WFL for tasks assessed/performed Overall Cognitive Status: Within Functional Limits for tasks assessed                                        Exercises General Exercises - Lower Extremity Ankle Circles/Pumps: AROM;10 reps;Both;Supine Long Arc Quad: AROM;10 reps;Both;Seated Hip ABduction/ADduction: AROM;10 reps;Standing;Both(all standing activity performed with bil UE support) Hip Flexion/Marching: AROM;10 reps;Standing;Both Heel Raises: AROM;10 reps;Both;Standing Mini-Sqauts: AROM;10 reps;Both;Standing    General Comments        Pertinent Vitals/Pain Pain Assessment: No/denies pain Pain Intervention(s): Monitored during session    Home Living                      Prior Function            PT Goals (current goals can now be found in the care plan section) Progress towards PT goals: Progressing toward goals    Frequency  Min 2X/week      PT Plan Current plan remains appropriate    Co-evaluation              AM-PAC PT "6 Clicks" Mobility   Outcome Measure  Help needed turning from your back to your side while in a flat bed without using bedrails?: A Little Help needed moving from lying on your back to sitting on the side of a flat bed without using bedrails?: A Little Help needed moving to and from a bed to a chair (including a wheelchair)?: A Little Help needed standing up from a chair using your arms (e.g., wheelchair or bedside chair)?: A Little Help needed to walk in hospital room?: A Little Help needed climbing 3-5 steps with a railing? : A Little 6 Click  Score: 18    End of Session Equipment Utilized During Treatment: Gait belt Activity Tolerance: Patient tolerated treatment well Patient left: with call bell/phone within reach;in bed;with nursing/sitter in room(with RN (to take foley out))   PT Visit Diagnosis: Unsteadiness on feet (R26.81)     Time: 0086-7619 PT Time Calculation (min) (ACUTE ONLY): 15 min  Charges:  $Therapeutic Exercise: 8-22 mins                     Carmelia Bake, PT, DPT Acute Rehabilitation Services Office: [phone] Pager: [phone]  Trena Platt 04/01/2018, 4:00 PM

## 2018-04-01 NOTE — Care Management Important Message (Signed)
Important Message  Patient Details  Name: [name] MRN: [id number] Date of Birth: 1940-08-27   Medicare Important Message Given:  Yes    Kerin Salen 04/01/2018, 11:23 [place] Message  Patient Details  Name: [name] MRN: [id number] Date of Birth: 02-07-1941   Medicare Important Message Given:  Yes    Kerin Salen 04/01/2018, 11:23 AM

## 2018-04-02 LAB — CBC
HCT: 37.6 % (ref 36.0–46.0)
Hemoglobin: 12 g/dL (ref 12.0–15.0)
MCH: 29.9 pg (ref 26.0–34.0)
MCHC: 31.9 g/dL (ref 30.0–36.0)
MCV: 93.8 fL (ref 80.0–100.0)
Platelets: 273 10*3/uL (ref 150–400)
RBC: 4.01 MIL/uL (ref 3.87–5.11)
RDW: 13.3 % (ref 11.5–15.5)
WBC: 9.7 10*3/uL (ref 4.0–10.5)
nRBC: 0 % (ref 0.0–0.2)

## 2018-04-02 LAB — GLUCOSE, CAPILLARY
Glucose-Capillary: 103 mg/dL — ABNORMAL HIGH (ref 70–99)
Glucose-Capillary: 127 mg/dL — ABNORMAL HIGH (ref 70–99)

## 2018-04-02 NOTE — Consult Note (Signed)
[place] Nurse ostomy follow up Stoma type/location: LUQ colostomy Stomal assessment/size: oval, flush. Stoma last seen by this writer on 12/27.  Patient changed pouch with bedside RN on Sunday (they forgot to use ring). Pouch experienced leak and pouch changed again on Monday.  Next scheduled change will be on Wednesday. Peristomal assessment: Not seen today.  Patient and Bedside RN reported Monday that rash has resolved. Treatment options for stomal/peristomal skin: Skin barrier ring, convex pouch and belt. Output: soft brown stool  Ostomy pouching: 1pc.soft convex with skin barrier ring  Education provided: Patient supplies are replenished, shown 1-page teaching guide for pouch changes and she feels comfortable with that. Discussed supplies and Secure start service (they will be calling her and assisting her to become established with a mail order provider). IF she decides to use a local provider, she indicates that she is a [employer] [place] [facility].   Patient discussed that she has been rinsing pouch with a syringe of water.  While this is not necessary, patient reports that it makes her feel "cleaner".  We discuss that this will make it a little less convenient to use the restroom in public or at a friend's home or her son's home and that using toilet paper as originally instructed might be preferred in the long run.  She decides she might rinse once or twice daily at home.  Supplies provided for patient (1-piece convex pouches in two sizes: 2-inch cutting surface for the next week and a half and 1 and 1/2 inch cutting surface for thereafter as stomas is expected to continue shrinking. Enrolled patient in [place] Start Discharge program: Yes  [place] nursing team will follow while in house, and will remain available to this patient, the nursing and medical teams.    Thanks, Maudie Flakes, MSN, RN, [place], Arther Abbott  Pager# [phone]

## 2018-04-02 NOTE — Progress Notes (Signed)
Patient is set to discharge to [facility] today. Patient aware and declined CSW to notify any family members. Discharge packet given to RN, Joellen Jersey. PTAR scheduled for 4PM.  Please call report to [place], [place] Worker [phone]

## 2018-04-02 NOTE — Progress Notes (Signed)
Nutrition Brief Note  Patient identified for LOS (day #13).  Wt Readings from Last 15 Encounters:  04/02/18 92.9 kg  03/15/18 89.4 kg  03/06/18 89.4 kg  02/21/18 89.4 kg  01/17/18 86.6 kg  01/10/18 89 kg  12/26/17 88.7 kg  10/08/17 88.4 kg  09/25/17 89.1 kg  08/29/17 90.4 kg  08/09/17 93.2 kg  08/02/17 92.1 kg  07/27/17 90.7 kg  07/23/17 91.2 kg  07/19/17 91.2 kg    Body mass index is 34.61 kg/m. Patient meets criteria for obesity based on current BMI. Abdominal incision from 12/18 when patient underwent lap sigmoidectomy with colostomy, takedown of colovesical fistula, cystoscopy with bilateral ureteral catheter placement.  Current diet order is Carb Modified and she has been consuming approximately 50-100% since surgery on 03/20/18.. Labs and medications reviewed.   No nutrition interventions warranted at this time. If nutrition issues arise, please consult RD.      Jarome Matin, MS, RD, LDN, [facility] Inpatient Clinical Dietitian Pager # [phone] After hours/weekend pager # [phone]

## 2018-04-02 NOTE — Discharge Summary (Signed)
Patient ID: [name] MRN: [id number] DOB/AGE: May 05, 1940 77 y.o.  Admit date: 03/20/2018 Discharge date: 04/02/2018  Discharge Diagnoses Patient Active Problem List   Diagnosis Date Noted  . Mesenteric mass (lipoma) s/p excision 03/20/2018 03/25/2018  . Diverticulitis of sigmoid colon 03/25/2018  . S/P laparoscopic-assisted sigmoidectomy 03/20/2018  . Colonic mass 03/20/2018  . History of adenomatous polyp of colon   . Polyp of ascending colon   . 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 Orthostatic hypotension 09/24/2017  . Visit for monitoring Tikosyn therapy 07/23/2017  . Depression 06/15/2017  . Acute respiratory failure with hypoxia ([place])   . Hypothyroidism   . Bronchitis 03/22/2016  . Abnormal CT of the chest 03/16/2016  . Coronary artery disease involving coronary bypass graft of native heart with angina pectoris ([place]) 03/16/2016  . Chronic anticoagulation 10/06/2015  . On dofetilide therapy 10/06/2015  . Atrial fibrillation ([place]) 09/15/2015  . Snoring 02/17/2015  . Chronic low back pain 11/09/2014  . Disc degeneration, lumbar 11/09/2014  . Addison disease ([place]) 09/30/2014  . Chronic back pain 09/30/2014  . Hematochezia-(Hgb stable) 09/03/2014  . DOE (dyspnea on exertion) 09/01/2014  . Lumbar radiculopathy 04/27/2014  . Osteoarthritis 11/18/2008  . Hyperlipidemia 11/17/2008  . Essential hypertension 11/17/2008    Consultants Internal medicine - assistance with managing comorbidities  Procedures Laparoscopic sigmoidectomy with takedown of colovesical fistula, cystoscopy/stents (urology), end colostomy - 03/20/18  HPI: [name] is a very pleasant 67yoF with hx of HTN, HLD, Addison's (on hydrocortisone), Afib, CAD s/p 3v CABG, chronic back pain with neurostimulator for this whom is here today for f/u regarding colovesical fistula. She has a known history of diverticulosis based on prior CT scans as well as  colonoscopy reports. 3 mo ago, she is noticed some blood-tinged urine and since that time has had intermittent episodes of what she describes as hematuria as well as some sediment in her urine. She denies any abdominal pain, fever, chills, nausea, vomiting, or changes in her bowel habits. She states she has normal formed brown stools. She was seen by her PCP and subsequently over at [facility] where she underwent a CT scan 11/2017 which demonstrated findings consistent with a colovesical fistula extending from the mid sigmoid colon to the left superior bladder wall. 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 The CT report notes this was present dating back to 2012 and in 2016 demonstrated minimal change. It was 2.1 cm and 2016 and is now 3.3 cm  She is currently taking AZO for chronic mild dysuria that has been present for 12 weeks.  Her last scope 2016 and demonstrated 3 sessile polyps in the transverse, sigmoid and hepatic flexure. Mild diverticulosis in the left colon.   She underwent colonoscopy with Dr. Ardis Hughs 01/17/18 - 3 mm polyp, ascending colon and removed. Multiple small largemouth diverticula in the sigmoid and distal descending colon. This region was edematous, tortuous, and narrowed.  She is here today for follow-up. She was cleared by her PCP, Dr. Joylene Draft for surgery and he stated it was okay to posterior anticoagulation for 72 hours or longer if needed. He instructed her to stop her Eliquis 4 days prior to surgery.  She also received clearance from her endocrinologist-Dr. Chalmers Cater at [facility]. For her adrenal insufficiency, perioperatively, he recommended hydrocortisone 50 mg IM/IV starting 30 minutes prior to surgery and every 8 hours until patient is  clear to start taking oral medications. Following this, he recommended she start taking her home Cortef 20 mg by mouth twice a  day  Options were discussed moving forward and she opted to pursue surgical intervention. Please refer to H&P for details regarding this discussion  Hospital Course: She was taken to the OR on 03/20/18 for the above procedure. She was admitted to stepdown postoperatively for close monitoring given her Addison's disease. She received 149m Hydrocortisone on induction but no further stress dosing was necessary. She recovered relatively uneventfully. Her diet was advanced and she began having bowel fxn via stoma. Her PO anticoagulation was restarted on POD#2 and her hgb remained stable. She did develop oral thrush due to her chronic steroid use and need for perioperative antibiotics. This resolved with diflucan. This was noted to have a possible drug interaction with her anti-arrhythmics (QT prolongation) and was discontinued but her symptoms of thrush had also resolved at that point. On 12/30, a cystogram was performed to evaluate her bladder/fistula takedown - this demonstrated a normal appearing bladder without evidence of leak. Her catheter was therefore removed.  She had a prolonged admission as much of this time was spent awaiting placement in SNF as recommended by PT and OT. She was ultimately accepted to SNF on 04/02/18 and was stable for discharge.  Her pathology returned consistent with diverticulitis (benign). The retroperitoneal lesion seen on CT was removed at surgery and sent for pathology - returned mature fibroadipose tissue. All of this was discussed with the patient.   Allergies as of 04/02/2018      Reactions   Percocet [oxycodone-acetaminophen] Other (See Comments)   Hallucination, pt is not allergic to tylenol(acetaminophen)   Tramadol Other (See Comments)   Hallucinations.   Sulfa Antibiotics Itching   Vaginal itching   Sulfacetamide Sodium Itching   Vaginal itching      Medication List    TAKE these medications   acetaminophen 500 MG tablet Commonly known as:   TYLENOL Take 500 mg by mouth daily as needed for headache.   aspirin EC 81 MG tablet Take 81 mg by mouth every Monday, Wednesday, and Friday.   CLONAZEPAM PO Take 0.25 mg by mouth 2 (two) times daily as needed for anxiety.   Cyanocobalamin 1000 MCG/ML Kit Inject 1 mL into the muscle every 30 (thirty) days. Take one (1) injection as directed every month.   diltiazem 180 MG 24 hr capsule Commonly known as:  CARDIZEM CD Take 1 capsule (180 mg total) by mouth at bedtime.   dofetilide 125 MCG capsule Commonly known as:  TIKOSYN TAKE ONE CAPSULE TWICE A DAY   ELIQUIS 5 MG Tabs tablet Generic drug:  apixaban Take 5 mg by mouth 2 (two) times daily.   Evolocumab 140 MG/ML Soaj Commonly known as:  REPATHA SURECLICK Inject 1 pen into the skin every 14 (fourteen) days.   FLUoxetine 10 MG tablet Commonly known as:  PROZAC Take 10 mg by mouth every morning.   furosemide 20 MG tablet Commonly known as:  LASIX Take 20 mg by mouth every 3 (three) days.   hydrocortisone 20 MG tablet Commonly known as:  CORTEF Take 20 mg by mouth 2 (two) times daily.   hyoscyamine 0.125 MG SL tablet Commonly known as:  LEVSIN SL Place 0.125 mg under the tongue daily as needed for cramping (IBS symptoms).   levothyroxine 88 MCG tablet Commonly known as:  SYNTHROID, LEVOTHROID Take 1 tablet (88 mcg total) by mouth daily before breakfast.  MAGNESIUM PO Take 1,000 mg by mouth daily.   NITROSTAT 0.4 MG SL tablet Generic drug:  nitroGLYCERIN ONE TABLET UNDER TONGUE AS NEEDED FOR CHEST PAIN AS DIRECTED What changed:  See the new instructions.   omeprazole 20 MG capsule Commonly known as:  PRILOSEC Take 20 mg by mouth daily.   polyethylene glycol-electrolytes 420 g solution Commonly known as:  NuLYTELY/GoLYTELY Take 4,000 mLs by mouth as directed.   potassium chloride 10 MEQ tablet Commonly known as:  K-DUR TAKE TWO TABLETS EVERY DAY What changed:  See the new instructions.   PROBIOTIC  FORMULA PO Take 1 capsule by mouth at bedtime.   pyridoxine 200 MG tablet Commonly known as:  B-6 Take 200 mg by mouth daily.   rOPINIRole 0.25 MG tablet Commonly known as:  REQUIP Take 0.5 mg by mouth at bedtime.   valsartan 80 MG tablet Commonly known as:  DIOVAN Take half tablet (40 mg) by mouth each morning. Take additional half (40 mg) by mouth in the afternoon as needed for BP greater than 140/80. What changed:    how much to take  how to take this  when to take this   vitamin C 1000 MG tablet Take 1,000 mg by mouth daily.   Vitamin D (Ergocalciferol) 1.25 MG (50000 UT) Caps capsule Commonly known as:  DRISDOL Take 50,000 Units by mouth 2 (two) times a week. Takes on Tuesdays and Fridays     ASK your doctor about these medications   traMADol 50 MG tablet Commonly known as:  ULTRAM Take 1 tablet (50 mg total) by mouth every 6 (six) hours as needed for up to 7 days (postop pain not controlled with tylenol or ibuprofen). Ask about: Should I take this medication?        Follow-up Information    Ileana Roup, MD. Go on 04/08/2018.   Specialty:  General Surgery Why:  Follow up appointment scheduled for 10:30 AM. Please arrive 30 min prior to appointment time. Bring photo ID and any insurance information with you.  Contact information: [place] [phone] [phone]           Cyd Hostler M. Dema Severin, M.D. [facility]

## 2018-04-02 NOTE — Progress Notes (Signed)
Report called to Amy, RN at [facility].  All questions answered.  PTAR arranged via social work.  Packet ready for pick up.

## 2018-04-02 NOTE — Progress Notes (Signed)
Subjective Feeling well today. Happy to have foley out. Tolerating diet without n/v. Colostomy working well. Denies complaints  Objective: Vital signs in last 24 hours: Temp:  [97.9 F (36.6 C)-98.3 F (36.8 C)] 98.3 F (36.8 C) (12/31 0508) Pulse Rate:  [69-79] 69 (12/31 0508) Resp:  [18-24] 18 (12/31 0508) BP: (140-153)/(74-97) 146/97 (12/31 0508) SpO2:  [92 %-96 %] 92 % (12/31 0508) Weight:  [92.9 kg] 92.9 kg (12/31 0508) Last BM Date: 04/01/18  Intake/Output from previous day: 12/30 0701 - 12/31 0700 In: -  Out: 2100 [Urine:2100] Intake/Output this shift: No intake/output data recorded.  Gen: NAD, comfortable CV: RRR Pulm: Normal work of breathing Abd: Soft, NT/ND. Colostomy productive, brown stool in appliance. Ext: SCDs in place  Lab Results: CBC  Recent Labs    04/02/18 0611  WBC 9.7  HGB 12.0  HCT 37.6  PLT 273   BMET No results for input(s): NA, K, CL, CO2, GLUCOSE, BUN, CREATININE, CALCIUM in the last 72 hours. PT/INR No results for input(s): LABPROT, INR in the last 72 hours. ABG No results for input(s): PHART, HCO3 in the last 72 hours.  Invalid input(s): PCO2, PO2  Studies/Results:  Anti-infectives: Anti-infectives (From admission, onward)   Start     Dose/Rate Route Frequency Ordered Stop   03/27/18 1000  fluconazole (DIFLUCAN) tablet 200 mg  Status:  Discontinued     200 mg Oral Daily 03/25/18 0858 04/01/18 1049   03/25/18 1000  fluconazole (DIFLUCAN) IVPB 200 mg  Status:  Discontinued     200 mg 100 mL/hr over 60 Minutes Intravenous Every 24 hours 03/24/18 1245 03/25/18 0858   03/25/18 1000  fluconazole (DIFLUCAN) IVPB 200 mg     200 mg 100 mL/hr over 60 Minutes Intravenous Every 24 hours 03/25/18 0858 03/26/18 1146   03/24/18 1000  fluconazole (DIFLUCAN) IVPB 100 mg  Status:  Discontinued     100 mg 50 mL/hr over 60 Minutes Intravenous Every 24 hours 03/23/18 0859 03/24/18 1245   03/23/18 1000  fluconazole (DIFLUCAN) IVPB 200 mg      200 mg 100 mL/hr over 60 Minutes Intravenous Once 03/23/18 0859 03/23/18 1242   03/20/18 1400  neomycin (MYCIFRADIN) tablet 1,000 mg  Status:  Discontinued     1,000 mg Oral 3 times per day 03/20/18 0644 03/20/18 0645   03/20/18 1400  metroNIDAZOLE (FLAGYL) tablet 1,000 mg  Status:  Discontinued     1,000 mg Oral 3 times per day 03/20/18 0644 03/20/18 0645   03/20/18 0645  cefoTEtan (CEFOTAN) 2 g in sodium chloride 0.9 % 100 mL IVPB     2 g 200 mL/hr over 30 Minutes Intravenous On call to O.R. 03/20/18 0644 03/20/18 0840       Assessment/Plan: Patient Active Problem List   Diagnosis Date Noted  . Mesenteric mass (lipoma) s/p excision 03/20/2018 03/25/2018  . Diverticulitis of sigmoid colon 03/25/2018  . S/P laparoscopic-assisted sigmoidectomy 03/20/2018  . Colonic mass 03/20/2018  . History of adenomatous polyp of colon   . Polyp of ascending colon   . 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 Orthostatic hypotension 09/24/2017  . Visit for monitoring Tikosyn therapy 07/23/2017  . Depression 06/15/2017  . Acute respiratory failure with hypoxia ([place])   . Hypothyroidism   . Bronchitis 03/22/2016  . Abnormal CT of the chest 03/16/2016  . Coronary artery disease involving coronary bypass graft of native heart with angina pectoris ([place]) 03/16/2016  . Chronic anticoagulation 10/06/2015  .  On dofetilide therapy 10/06/2015  . Atrial fibrillation ([place]) 09/15/2015  . Snoring 02/17/2015  . Chronic low back pain 11/09/2014  . Disc degeneration, lumbar 11/09/2014  . Addison disease ([place]) 09/30/2014  . Chronic back pain 09/30/2014  . Hematochezia-(Hgb stable) 09/03/2014  . DOE (dyspnea on exertion) 09/01/2014  . Lumbar radiculopathy 04/27/2014  . Osteoarthritis 11/18/2008  . Hyperlipidemia 11/17/2008  . Essential hypertension 11/17/2008   s/p Procedure(s): LAPAROSCOPIC SIGMOIDECTOMY WITH COLOSTOMY LAPAROSCOPIC TAKE DOWN OF COLOVESICAL  FISTULA CYSTOSCOPY WITH BILATERAL RETROGRADE AND BILATERAL URETERAL CATHETER PLACEMENT 03/20/2018  -On home dose steroids for Addison's; stable -On home Eliquis for chronic atrial fibrillation -Oral thrush - resolved; diflucan discontinued -Cystogram normal, foley removed -PPx: Eliquis, SCDs -Dispo: Pending SNF - asking if she can go home with [facility] instead given delays. I informed her that currently our PT/OT are recommending SNF and unless they think its safe for her to go home with [facility], we would continue to recommend SNF.   LOS: 13 days   Sharon Mt. Dema Severin, M.D. General and Colorectal Surgery [facility]

## 2018-04-18 ENCOUNTER — Other Ambulatory Visit: Payer: Medicare Other

## 2018-06-05 ENCOUNTER — Telehealth: Payer: Self-pay

## 2018-06-05 ENCOUNTER — Ambulatory Visit (INDEPENDENT_AMBULATORY_CARE_PROVIDER_SITE_OTHER): Payer: Medicare Other | Admitting: Neurology

## 2018-06-05 ENCOUNTER — Ambulatory Visit: Payer: Medicare Other | Admitting: Neurology

## 2018-06-05 ENCOUNTER — Encounter: Payer: Self-pay | Admitting: Neurology

## 2018-06-05 DIAGNOSIS — G609 Hereditary and idiopathic neuropathy, unspecified: Secondary | ICD-10-CM

## 2018-06-05 DIAGNOSIS — E538 Deficiency of other specified B group vitamins: Secondary | ICD-10-CM

## 2018-06-05 DIAGNOSIS — R202 Paresthesia of skin: Secondary | ICD-10-CM

## 2018-06-05 DIAGNOSIS — M5416 Radiculopathy, lumbar region: Secondary | ICD-10-CM

## 2018-06-05 MED ORDER — GABAPENTIN 100 MG PO CAPS: ORAL_CAPSULE | ORAL | 3 refills | Status: DC

## 2018-06-05 NOTE — Telephone Encounter (Signed)
I contacted the patient and left a vm requesting a call back to schedule 4 moth f/u with Dr. Jannifer Franklin. Dr. Jannifer Franklin had an EMG visit with the pt today (06/05/18) but appears visit was not scheduled. If pt calls back please schedule 4 month f/u.

## 2018-06-05 NOTE — Progress Notes (Addendum)
The patient comes in today for EMG nerve conduction study evaluation.  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  EMG evaluation of the right lower extremity shows some mild chronic stable distal neuropathic denervation consistent with the diagnosis of peripheral neuropathy.  Given the ongoing pain and discomfort, the patient will be placed on gabapentin.  She is having shooting pain with movement around the lower pelvic area following her colon surgery.  She has restless leg syndrome, medications that she is on is helpful.     [place]    Nerve / Sites Muscle Latency Ref. Amplitude Ref. Rel Amp Segments Distance Velocity Ref. Area    ms ms mV mV %  cm m/s m/s mVms  R Peroneal - EDB     Ankle EDB 4.7 ?6.5 3.4 ?2.0 100 Ankle - EDB 9   10.1     Fib head EDB 10.7  3.3  98.6 Fib head - Ankle 27 45 ?44 10.5     Pop fossa EDB 13.0  2.1  64 Pop fossa - Fib head 10 45 ?44 4.9         Pop fossa - Ankle      L Peroneal - EDB     Ankle EDB 4.6 ?6.5 3.1 ?2.0 100 Ankle - EDB 9   8.6     Fib head EDB 11.0  2.7  88.2 Fib head - Ankle 28 44 ?44 8.0     Pop fossa EDB 13.3  2.6  95.2 Pop fossa - Fib head 10 44 ?44 7.7         Pop fossa - Ankle      R Tibial - AH     Ankle AH 3.9 ?5.8 4.5 ?4.0 100 Ankle - AH 9   10.8     Pop fossa AH 12.2  2.2  47.8 Pop fossa - Ankle 34 41 ?41 7.3  L Tibial - AH     Ankle AH 4.3 ?5.8 4.2 ?4.0 100 Ankle - AH 9   10.3     Pop fossa AH 12.9  2.6  61.8 Pop fossa - Ankle 35 41 ?41 12.4             SNC    Nerve / Sites Rec. Site Peak Lat Ref.  Amp Ref. Segments Distance    ms ms V V  cm  R Sural - Ankle (Calf)     Calf Ankle NR ?4.4 NR ?6 Calf - Ankle 14  L Sural - Ankle (Calf)     Calf Ankle NR ?4.4 NR ?6 Calf - Ankle 14  R Superficial peroneal - Ankle     Lat leg Ankle NR ?4.4 NR ?6 Lat leg - Ankle 14  L Superficial peroneal - Ankle     Lat leg Ankle NR ?4.4 NR ?6 Lat leg - Ankle 14              F  Wave    Nerve F Lat Ref.    ms ms  R Tibial - AH 52.0 ?56.0  L Tibial - AH 43.1 ?56.0

## 2018-06-05 NOTE — Progress Notes (Signed)
Please refer to EMG and nerve conduction procedure note.  

## 2018-06-05 NOTE — Procedures (Signed)
     HISTORY:  [name] is a 78 year old patient with a history of restless leg syndrome and pain in both feet that is worse in the evening hours.  The patient is being evaluated for a possible peripheral neuropathy or a lumbosacral radiculopathy.  NERVE CONDUCTION STUDIES:  Nerve conduction studies were performed on both lower extremities.  The distal motor latencies and motor amplitudes for the peroneal and posterior tibial nerves were within the limits bilaterally.  The nerve conduction velocities for these nerves were normal bilaterally.  The sensory latencies for the sural and peroneal nerves were unobtainable bilaterally.  The F-wave latencies for the posterior tibial nerves were within normal limits bilaterally.   EMG STUDIES:  EMG study was performed on the right lower extremity:  The tibialis anterior muscle reveals 2 to 4K motor units with full recruitment. No fibrillations or positive waves were seen. The peroneus tertius muscle reveals 2 to 5K motor units with decreased recruitment. No fibrillations or positive waves were seen. The medial gastrocnemius muscle reveals 1 to 3K motor units with full recruitment. No fibrillations or positive waves were seen. The vastus lateralis muscle reveals 2 to 4K motor units with full recruitment. No fibrillations or positive waves were seen. The iliopsoas muscle reveals 2 to 4K motor units with full recruitment. No fibrillations or positive waves were seen. The biceps femoris muscle (long head) reveals 2 to 4K motor units with full recruitment. No fibrillations or positive waves were seen. The lumbosacral paraspinal muscles were tested at 3 levels, and revealed no abnormalities of insertional activity at all 3 levels tested. There was good relaxation.   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  EMG evaluation of the right lower extremity does  show some mild chronic stable distal signs of neuropathic denervation consistent with a peripheral neuropathy.  No evidence of an overlying lumbosacral radiculopathy was seen.  Jill Alexanders MD 06/05/2018 3:28 PM  [facility] [address] [place], [place] [address]  Phone [phone] Fax [phone]

## 2018-06-08 LAB — VITAMIN B12: Vitamin B-12: 468 pg/mL (ref 232–1245)

## 2018-06-08 LAB — MULTIPLE MYELOMA PANEL, SERUM
Albumin SerPl Elph-Mcnc: 3.4 g/dL (ref 2.9–4.4)
Albumin/Glob SerPl: 1.4 (ref 0.7–1.7)
Alpha 1: 0.2 g/dL (ref 0.0–0.4)
Alpha2 Glob SerPl Elph-Mcnc: 0.7 g/dL (ref 0.4–1.0)
B-GLOBULIN SERPL ELPH-MCNC: 1 g/dL (ref 0.7–1.3)
GLOBULIN, TOTAL: 2.6 g/dL (ref 2.2–3.9)
Gamma Glob SerPl Elph-Mcnc: 0.7 g/dL (ref 0.4–1.8)
IgA/Immunoglobulin A, Serum: 348 mg/dL (ref 64–422)
IgG (Immunoglobin G), Serum: 854 mg/dL (ref 700–1600)
IgM (Immunoglobulin M), Srm: 49 mg/dL (ref 26–217)
Total Protein: 6 g/dL (ref 6.0–8.5)

## 2018-06-08 LAB — RHEUMATOID FACTOR: Rheumatoid fact SerPl-aCnc: <10 IU/mL (ref 0.0–13.9)

## 2018-06-08 LAB — B. BURGDORFI ANTIBODIES: Lyme IgG/IgM Ab: <0.91 {ISR} (ref 0.00–0.90)

## 2018-06-08 LAB — ANA W/REFLEX: Anti Nuclear Antibody(ANA): NEGATIVE

## 2018-06-08 LAB — ANGIOTENSIN CONVERTING ENZYME: Angio Convert Enzyme: 66 U/L (ref 14–82)

## 2018-06-12 ENCOUNTER — Telehealth: Payer: Self-pay | Admitting: *Deleted

## 2018-06-12 NOTE — Telephone Encounter (Signed)
Dr. Lovena Le just wanted to check with you- this procedure done by PT -perianal biofeedback should not cause any issues with a fib ?  Just let me know.  Thanks.

## 2018-06-12 NOTE — Telephone Encounter (Signed)
   [facility] Pre-operative Risk Assessment    Request for surgical clearance:  1. What type of surgery is being performed? PERIANAL BIOFEEDBACK ; NOT AN INTERNAL/INVASIVE PROCEDURE  2. When is this surgery scheduled? TBD   3. What type of clearance is required (medical clearance vs. Pharmacy clearance to hold med vs. Both)? BOTH  4. Are there any medications that need to be held prior to surgery and how long?NONE LISTED   5. Practice name and name of physician performing surgery? [facility]; JANE BEDENBAUGH, MPT   6. What is your office phone number [phone]    7.   What is your office fax number [phone]  8.   Anesthesia type (None, local, MAC, general) ? NONE LISTED    Julaine Hua 06/12/2018, 1:31 PM  _________________________________________________________________   (provider comments below)

## 2018-06-12 NOTE — Telephone Encounter (Signed)
Since procedure is not listed as invasive/internal, would see if MD performing procedure is ok with continuing Eliquis without interruption.  Pt takes Eliquis for afib with CHADS2VASc score of 5 (age x2, sex, HTN, CAD). Renal function is normal. If anticoagulation hold is necessary, would be ok to hold Eliquis 1 day prior.

## 2018-06-12 NOTE — Telephone Encounter (Signed)
See below

## 2018-06-13 NOTE — Telephone Encounter (Signed)
   Primary Cardiologist: Sinclair Grooms, MD and Dr. Lovena Le  Chart reviewed as part of pre-operative protocol coverage. Given past medical history and time since last visit, based on ACC/AHA guidelines, [name] would be at acceptable risk for the planned procedure without further cardiovascular testing.   I believe she can have this procedure without stopping Eliquis.  If it must be stopped could hold 1 day prior to procedure.   I will route this recommendation to the requesting party via Epic fax function and remove from pre-op pool.  Please call with questions.  [name], NP 06/13/2018, 1:40 PM

## 2018-06-17 ENCOUNTER — Other Ambulatory Visit: Payer: Self-pay | Admitting: Interventional Cardiology

## 2018-06-21 NOTE — Telephone Encounter (Signed)
Ok to proceed with surgery. Ok to hold Eliquis. GT

## 2018-06-26 ENCOUNTER — Other Ambulatory Visit: Payer: Self-pay | Admitting: Interventional Cardiology

## 2018-06-26 NOTE — Telephone Encounter (Signed)
Pharmacy is requesting refill; would you like to fill this medication? Prescription is written differently from what we have on file. Please address. Thank you

## 2018-06-27 NOTE — Telephone Encounter (Signed)
Ok to send in refill for what we have on file.  D/c from hospital in December matches what is on file in the computer.

## 2018-07-05 ENCOUNTER — Telehealth: Payer: Self-pay | Admitting: Cardiology

## 2018-07-05 NOTE — Telephone Encounter (Signed)
Spoke to patient regarding rescheduling her face to face appointment with [name] with myself given that she is stable and not having cardiac symptoms. Her biggest complaint is deconditioning after her surgery and current allergies. I have ensured her that we will discuss this during our visit. She prefers to have a telephone visit at this time. I have room on my schedule either 04/07, 04/13, 4/21, 04/22  Thank you Kathyrn Drown NP-C [facility] Pager: [phone]

## 2018-07-05 NOTE — Telephone Encounter (Signed)
Spoke with pt and she preferred to keep appt on same day.  Scheduled her for a phone visit on 4/13 with Kathyrn Drown, NP.

## 2018-07-12 NOTE — Progress Notes (Signed)
Virtual Visit via Telephone Note   This visit type was conducted due to national recommendations for restrictions regarding the COVID-19 Pandemic (e.g. social distancing) in an effort to limit this patient's exposure and mitigate transmission in our community.  Due to her co-morbid illnesses, this patient is at least at moderate risk for complications without adequate follow up.  This format is felt to be most appropriate for this patient at this time.  The patient did not have access to video technology/had technical difficulties with video requiring transitioning to audio format only (telephone).  All issues noted in this document were discussed and addressed.  No physical exam could be performed with this format.  Please refer to the patient's chart for her  consent to telehealth for [facility].   Evaluation Performed:  Follow-up visit  Date:  07/15/2018   ID:  Abigail, Scott 09-17-1940, MRN [id number]  Patient Location: Home  Provider Location: Home  PCP:  Crist Infante, MD  Cardiologist:  Sinclair Grooms, MD   Chief Complaint:  CAD follow up   History of Present Illness:    [name] is a 78 y.o. female who presents via audio/video conferencing for a telehealth visit today for follow of CAD, AF and HTN, seen for [name].    Ms. Erno has a prior history of CAD with prior bypass grafting in 2013, atrial fibrillation resulting in acute diastolic heart failure treated with Tikosyn, hypertension, chronic anticoagulation therapy with Eliquis and hyperlipidemia. She was last seen by 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 Per chart review, she was doing well with no complaints of palpitations or chest pain.  She had no heart failure symptoms.  She was then seen by primary EP physician 02/2018 given her history of atrial fibrillation on Tikosyn.  At that time she was noted to be doing well on this therapy  with no episodes of symptomatic atrial fibrillation.  She was in normal sinus rhythm during that visit.    Today she reports from a heart perspective she is doing relatively well.  She underwent an extensive GI surgery December 2019 for colovesical fistula repair.  In the postoperative setting she went for a several week period to rehabilitation and has stated that since that time, she has been relatively short of breath with moderate exertion. She was followed briefly by home health PT however, reports that since that time has not been doing much activity. She reports being on steroids for her Addison's disease and has overtime been gaining weight which is very concerning to her.  She denies orthopnea, LE swelling, cough or other HF symptoms. We discussed that her symptoms are likely in the setting of physical deconditioning given the above however she is to monitor her symptoms closely. She denies chest pain or other anginal symptoms. She is asking about a safe antihistamine for her allergies. BP was high initially however I had her retake this during our visit which came down to 146/75, a much more tolerable BP. She reports compliance with Tikosyn and Eliquis with no signs of acute bleeding. She states that she has been taking Repatha without complication or side effects.  She has been having issues with urinary burning after her hospitalization and prolonged catheter placement.  She follows with urology tomorrow, 07/16/2018.  She spoke with her surgeon last week via telephone.  She is asking for a sleep apnea study today.  The patient does not have  symptoms concerning for COVID-19 infection (fever, chills, cough, or new shortness of breath).   Past Medical History:  Diagnosis Date  . Addison's disease ([place])   . Anxiety   . Arthritis    "everywhere"  . Atrial fibrillation ([facility])    Eliquis  . Chronic bronchitis ([place])    "although I didn't get it this year" (07/23/2017)  . 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  last knee surgery dr. Elmyra Ricks gave large dose of hydrocortisal and avoided the hypotensive side effectas of addison's disease.  . Coronary artery disease   . Depression   . GERD (gastroesophageal reflux disease)   . Hyperlipemia   . Hypertension   . Hypothyroidism   . Lumbar radiculopathy 04/27/2014  . Migraine    "in my 20's" (07/23/2017)  . Recurrent upper respiratory infection (URI)   . UTI (lower urinary tract infection) 07/09/2011   Past Surgical History:  Procedure Laterality Date  . ACHILLES TENDON SURGERY Left 07/16/2014   bone spur removed  . ANTERIOR LUMBAR FUSION  1980   L4-5  . BACK SURGERY    . CARDIAC CATHETERIZATION  2013  . CARDIAC CATHETERIZATION N/A 09/02/2014   Procedure: Left Heart Cath and Coronary Angiography;  Surgeon: Wellington Hampshire, MD;  Location: [place] [facility];  Service: Cardiovascular;  Laterality: N/A;  . CARDIOVERSION N/A 10/22/2015   Procedure: CARDIOVERSION;  Surgeon: Sanda Klein, MD;  Location: [facility];  Service: Cardiovascular;  Laterality: N/A;  . CARDIOVERSION N/A 06/07/2017   Procedure: CARDIOVERSION;  Surgeon: Larey Dresser, MD;  Location: [facility];  Service: Cardiovascular;  Laterality: N/A;  . CARDIOVERSION N/A 07/05/2017   Procedure: CARDIOVERSION;  Surgeon: Pixie Casino, MD;  Location: [place];  Service: Cardiovascular;  Laterality: N/A;  . CARPAL TUNNEL RELEASE Right   . CATARACT EXTRACTION, BILATERAL Bilateral   . COLON RESECTION N/A 03/20/2018   Procedure: LAPAROSCOPIC SIGMOIDECTOMY WITH COLOSTOMY;  Surgeon: Ileana Roup, MD;  Location: [facility];  Service: General;  Laterality: N/A;  . COLON SURGERY     laparascopic vs. open sigmoidectomy with cystoscopy  Dr. Dema Severin and Dr. Gloriann Loan 03-20-18  . COLONOSCOPY WITH PROPOFOL N/A 01/17/2018   Procedure: COLONOSCOPY WITH PROPOFOL;  Surgeon: Milus Banister, MD;  Location: [facility];  Service: Endoscopy;   Laterality: N/A;  . CORONARY ARTERY BYPASS GRAFT  06/26/2011   Procedure: CORONARY ARTERY BYPASS GRAFTING (CABG);  Surgeon: Melrose Nakayama, MD;  Location: [place];  Service: Open Heart Surgery;  Laterality: N/A;  times using Greater Saphenous Vein Graft harvested endoscopically from right leg  . CYSTOSCOPY W/ URETERAL STENT PLACEMENT Bilateral 03/20/2018   Procedure: CYSTOSCOPY WITH BILATERAL RETROGRADE AND BILATERAL URETERAL CATHETER PLACEMENT;  Surgeon: Lucas Mallow, MD;  Location: [facility];  Service: Urology;  Laterality: Bilateral;  . DIAGNOSTIC LAPAROSCOPY    . DILATION AND CURETTAGE OF UTERUS    . JOINT REPLACEMENT    . KNEE ARTHROSCOPY Bilateral   . LAPAROSCOPIC CHOLECYSTECTOMY    . POLYPECTOMY  01/17/2018   Procedure: POLYPECTOMY;  Surgeon: Milus Banister, MD;  Location: [facility];  Service: Endoscopy;;  . REVISION TOTAL KNEE ARTHROPLASTY Right   . SPINAL CORD STIMULATOR IMPLANT  12/10/15  . TAKE DOWN OF INTESTINAL FISTULA N/A 03/20/2018   Procedure: LAPAROSCOPIC TAKE DOWN OF COLOVESICAL FISTULA;  Surgeon: Ileana Roup, MD;  Location: [facility];  Service: General;  Laterality: N/A;  . TONSILLECTOMY    .  TOTAL KNEE ARTHROPLASTY Bilateral      Current Meds  Medication Sig  . acetaminophen (TYLENOL) 500 MG tablet Take 500 mg by mouth daily as needed for headache.  [name] apixaban (ELIQUIS) 5 MG TABS tablet Take 5 mg by mouth 2 (two) times daily.  . Ascorbic Acid (VITAMIN C) 1000 MG tablet Take 1,000 mg by mouth daily.   [name] aspirin EC 81 MG tablet Take 81 mg by mouth every Monday, Wednesday, and Friday.  [name] CLONAZEPAM PO Take 0.25 mg by mouth 2 (two) times daily as needed for anxiety.   . Cyanocobalamin 1000 MCG/ML KIT Inject 1 mL into the muscle every 30 (thirty) days. Take one (1) injection as directed every month.  . diltiazem (CARDIZEM CD) 180 MG 24 hr capsule TAKE ONE CAPSULE EACH DAY  . dofetilide (TIKOSYN) 125 MCG capsule TAKE ONE CAPSULE TWICE A DAY (Patient taking  differently: Take 125 mcg by mouth 2 (two) times daily. )  . Evolocumab (REPATHA SURECLICK) 408 MG/ML SOAJ Inject 1 pen into the skin every 14 (fourteen) days.  [name] FLUoxetine (PROZAC) 10 MG tablet Take 10 mg by mouth every morning.   . furosemide (LASIX) 20 MG tablet TAKE ONE TABLET BY MOUTH ONCE DAILY  . gabapentin (NEURONTIN) 100 MG capsule 1 capsule in the morning and midday, 3 in the evening  . hydrocortisone (CORTEF) 20 MG tablet Take 20 mg by mouth 2 (two) times daily.  . hyoscyamine (LEVSIN SL) 0.125 MG SL tablet Place 0.125 mg under the tongue daily as needed for cramping (IBS symptoms).  [name] levothyroxine (SYNTHROID, LEVOTHROID) 88 MCG tablet Take 1 tablet (88 mcg total) by mouth daily before breakfast.  . MAGNESIUM PO Take 1,000 mg by mouth daily.   [name] NITROSTAT 0.4 MG SL tablet ONE TABLET UNDER TONGUE AS NEEDED FOR CHEST PAIN AS DIRECTED (Patient taking differently: Place 0.4 mg under the tongue every 5 (five) minutes as needed for chest pain. )  . omeprazole (PRILOSEC) 20 MG capsule Take 20 mg by mouth daily.   . polyethylene glycol-electrolytes (NULYTELY/GOLYTELY) 420 g solution Take 4,000 mLs by mouth as directed.  . potassium chloride (K-DUR) 10 MEQ tablet TAKE TWO TABLETS EVERY DAY (Patient taking differently: Take 20 mEq by mouth daily. )  . Probiotic Product (PROBIOTIC FORMULA PO) Take 1 capsule by mouth at bedtime.   . pyridoxine (B-6) 200 MG tablet Take 200 mg by mouth daily.  [name] rOPINIRole (REQUIP) 0.25 MG tablet Take 0.5 mg by mouth at bedtime.  . valsartan (DIOVAN) 80 MG tablet Take half tablet (40 mg) by mouth each morning. Take additional half (40 mg) by mouth in the afternoon as needed for BP greater than 140/80. (Patient taking differently: Take 80 mg by mouth at bedtime. Take half tablet (40 mg) by mouth each morning. Take additional half (40 mg) by mouth in the afternoon as needed for BP greater than 140/80. )  . Vitamin D, Ergocalciferol, (DRISDOL) 50000 UNITS CAPS Take  50,000 Units by mouth 2 (two) times a week. Takes on Tuesdays and Fridays     Allergies:   Percocet [oxycodone-acetaminophen]; Tramadol; Sulfa antibiotics; and Sulfacetamide sodium   Social History   Tobacco Use  . Smoking status: Passive Smoke Exposure - Never Smoker  . Smokeless tobacco: Never Used  . Tobacco comment: "husband passed in 2010; father passed 1978"  Substance Use Topics  . Alcohol use: Yes    Alcohol/week: 0.0 standard drinks    Comment: glass of wine occasionally   .  Drug use: No     Family Hx: The patient's family history includes Heart attack in her father; Hypertension in her maternal grandfather and mother; Stroke in her mother. There is no history of Colon cancer.  ROS:   Please see the history of present illness.     All other systems reviewed and are negative.   Prior CV studies:   The following studies were reviewed today:  Cardiac telemetry monitoring 09/01/2016:  Normal sinus rhythm  Frequent PAC's with episodes of atrial bigeminy  No Atrial Fibrillation   Normal study Notable for PAC's and occasional atrial bigeminy  Echocardiogram 09/20/2015: Study Conclusions  - Left ventricle: The cavity size was normal. Wall thickness was   increased in a pattern of moderate LVH. Systolic function was   vigorous. The estimated ejection fraction was in the range of 65%   to 70%. - Mitral valve: There was mild regurgitation. - Pulmonary arteries: PA peak pressure: 35 mm Hg (S).  Last cardiac catheterization 09/02/2014:  Ost LAD to Prox LAD lesion, 80% stenosed.  Mid LAD lesion, 40% stenosed.  2nd Diag lesion, 50% stenosed.  Prox Cx lesion, 90% stenosed.  Prox RCA lesion, 40% stenosed.  Mid RCA lesion, 20% stenosed.   1. Significant three-vessel coronary artery with patent grafts including LIMA to LAD, SVG to OM and SVG to RCA. 2. Normal left ventricular end-diastolic pressure. Normal LV systolic function by echo.  Recommendations: The  patient's dyspnea does not seem to be cardiac in origin. There is no evidence of obstructive disease affecting her grafts and volume status appears to be normal.   Labs/Other Tests and Data Reviewed:    Recent Labs: 09/25/2017: TSH 0.282 03/15/2018: ALT 21 03/25/2018: BUN 18; Creatinine, Ser 0.68; Magnesium 2.1; Potassium 4.8; Sodium 140 04/02/2018: Hemoglobin 12.0; Platelets 273   Recent Lipid Panel Lab Results  Component Value Date/Time   CHOL 100 09/02/2014 03:46 AM   TRIG 78 09/02/2014 03:46 AM   HDL 40 (L) 09/02/2014 03:46 AM   CHOLHDL 2.5 09/02/2014 03:46 AM   LDLCALC 44 09/02/2014 03:46 AM   LDLDIRECT 135 (H) 02/04/2018 03:55 PM    Wt Readings from Last 3 Encounters:  07/15/18 200 lb (90.7 kg)  04/02/18 204 lb 12.9 oz (92.9 kg)  03/15/18 197 lb (89.4 kg)     Objective:    Vital Signs:  BP 125/83   Pulse 71   Ht 5' 4.5" (1.638 m)   Wt 200 lb (90.7 kg)   BMI 33.80 kg/m    Pt was without shortness of breath during our telephone conversation.  No apparent distress.   ASSESSMENT & PLAN:    1.  CAD with prior bypass grafting in 2013: -Denies anginal symptoms -Continue with secondary risk reduction>>>last LDL was 135 02/04/2018 and patient noted to be intolerable to statins -Reports compliance with Repatha (previously had mild allergic reaction however reports has been tolerating well) -Continue ASA  2.  PAF with chronic anticoagulation: -Controlled on Tikosyn>> denies palpitations -Will need repeat labs>> will obtain BMET and magnesium tomorrow, 07/16/2018 -Continue Eliquis 5 mg -Last creatinine, 0.68 -K+, 4.8 with a magnesium of 2.1>>03/2018  3.  Hypertension: -Elevated 146/75 with goal less than or equal to 130/80 mmHg -Continue valsartan -Last creatinine, 0.68 on 03/25/2018 -Obtaining BMET tomorrow  4.  Hyperlipidemia: -Patient was referred to lipid clinic for [place] with LDL goal of less than 70 -Last LDL, 135 02/2018 -Statin intolerant -Repeating  lipid panel tomorrow  5.  Sleep apnea referral: -Patient  is asking for sleep apnea referral -Will place for possible home sleep study  6.  SOB: -Patient has mild complaints of dyspnea with moderate exertion since her extensive surgery 03/2018>> last echocardiogram 2017 with LVEF of 65 to 70% -Reports physical deconditioning and has worked with PT which is no longer being utilized -Denies orthopnea, LE swelling, weight fluctuations, cough -Does not appear to be cardiac related>>> encouraged increased physical activity with gradual walking to increase exercise tolerance -If no change in 1 to 2 months patient is to contact for sooner appointment>> may need more recent echocardiogram if no symptom improvement -Obtaining CBC to rule out anemia etiology although no acute signs of bleeding per patient   COVID-19 Education: The signs and symptoms of COVID-19 were discussed with the patient and how to seek care for testing (follow up with PCP or arrange E-visit). The importance of social distancing was discussed today.  Time:   Today, I have spent 25 minutes with the patient with telehealth technology discussing the above problems.     Medication Adjustments/Labs and Tests Ordered: Current medicines are reviewed at length with the patient today.  Concerns regarding medicines are outlined above.  Tests Ordered: Orders Placed This Encounter  Procedures  . Basic metabolic panel  . CBC  . Lipid panel  . Magnesium  . Split night study   Medication Changes: No orders of the defined types were placed in this encounter.   Disposition:  Follow up [name] in 6 months  Signed, Kathyrn Drown, NP  07/15/2018 10:51 AM    [place]

## 2018-07-15 ENCOUNTER — Other Ambulatory Visit: Payer: Self-pay

## 2018-07-15 ENCOUNTER — Ambulatory Visit: Payer: Medicare Other | Admitting: Interventional Cardiology

## 2018-07-15 ENCOUNTER — Encounter: Payer: Self-pay | Admitting: Cardiology

## 2018-07-15 ENCOUNTER — Telehealth (INDEPENDENT_AMBULATORY_CARE_PROVIDER_SITE_OTHER): Payer: Medicare Other | Admitting: Cardiology

## 2018-07-15 VITALS — BP 125/83 | HR 71 | Ht 64.5 in | Wt 200.0 lb

## 2018-07-15 DIAGNOSIS — I10 Essential (primary) hypertension: Secondary | ICD-10-CM | POA: Diagnosis not present

## 2018-07-15 DIAGNOSIS — Z951 Presence of aortocoronary bypass graft: Secondary | ICD-10-CM

## 2018-07-15 DIAGNOSIS — Z79899 Other long term (current) drug therapy: Secondary | ICD-10-CM

## 2018-07-15 DIAGNOSIS — E785 Hyperlipidemia, unspecified: Secondary | ICD-10-CM

## 2018-07-15 DIAGNOSIS — R5383 Other fatigue: Secondary | ICD-10-CM

## 2018-07-15 DIAGNOSIS — I480 Paroxysmal atrial fibrillation: Secondary | ICD-10-CM

## 2018-07-15 DIAGNOSIS — I2510 Atherosclerotic heart disease of native coronary artery without angina pectoris: Secondary | ICD-10-CM

## 2018-07-15 NOTE — Patient Instructions (Signed)
Medication Instructions:  Your physician recommends that you continue on your current medications as directed. Please refer to the Current Medication list given to you today.  If you need a refill on your cardiac medications before your next appointment, please call your pharmacy.   Lab work: CBC, BMET, MAG & LIPIDS to be done tomorrow 07/16/2018   If you have labs (blood work) drawn today and your tests are completely normal, you will receive your results only by: [name] MyChart Message (if you have MyChart) OR . A paper copy in the mail If you have any lab test that is abnormal or we need to change your treatment, we will call you to review the results.  Testing/Procedures: Your physician has recommended that you have a sleep study. This test records several body functions during sleep, including: brain activity, eye movement, oxygen and carbon dioxide blood levels, heart rate and rhythm, breathing rate and rhythm, the flow of air through your mouth and nose, snoring, body muscle movements, and chest and belly movement.    Follow-Up: At [facility], you and your health needs are our priority.  As part of our continuing mission to provide you with exceptional heart care, we have created designated Provider Care Teams.  These Care Teams include your primary Cardiologist (physician) and Advanced Practice Providers (APPs -  Physician Assistants and Nurse Practitioners) who all work together to provide you with the care you need, when you need it. You will need a follow up appointment in 6 months.  Please call our office 2 months in advance to schedule this appointment.  You may see Sinclair Grooms, MD or one of the following Advanced Practice Providers on your designated Care Team:   Truitt Merle, NP [name], NP . Kathyrn Drown, NP  Any Other Special Instructions Will Be Listed Below (If Applicable).

## 2018-07-16 ENCOUNTER — Telehealth: Payer: Self-pay

## 2018-07-16 ENCOUNTER — Other Ambulatory Visit: Payer: Medicare Other

## 2018-07-16 ENCOUNTER — Other Ambulatory Visit: Payer: Self-pay

## 2018-07-16 DIAGNOSIS — I480 Paroxysmal atrial fibrillation: Secondary | ICD-10-CM

## 2018-07-16 DIAGNOSIS — Z951 Presence of aortocoronary bypass graft: Secondary | ICD-10-CM

## 2018-07-16 DIAGNOSIS — Z79899 Other long term (current) drug therapy: Secondary | ICD-10-CM

## 2018-07-16 DIAGNOSIS — I2510 Atherosclerotic heart disease of native coronary artery without angina pectoris: Secondary | ICD-10-CM

## 2018-07-16 DIAGNOSIS — I10 Essential (primary) hypertension: Secondary | ICD-10-CM

## 2018-07-16 DIAGNOSIS — E785 Hyperlipidemia, unspecified: Secondary | ICD-10-CM

## 2018-07-16 LAB — BASIC METABOLIC PANEL
BUN/Creatinine Ratio: 19 (ref 12–28)
BUN: 15 mg/dL (ref 8–27)
CO2: 23 mmol/L (ref 20–29)
Calcium: 9.3 mg/dL (ref 8.7–10.3)
Chloride: 103 mmol/L (ref 96–106)
Creatinine, Ser: 0.81 mg/dL (ref 0.57–1.00)
GFR calc Af Amer: 80 mL/min/{1.73_m2} (ref >59)
GFR calc non Af Amer: 69 mL/min/{1.73_m2} (ref >59)
Glucose: 133 mg/dL — ABNORMAL HIGH (ref 65–99)
Potassium: 4.5 mmol/L (ref 3.5–5.2)
Sodium: 143 mmol/L (ref 134–144)

## 2018-07-16 LAB — CBC
Hematocrit: 38.3 % (ref 34.0–46.6)
Hemoglobin: 12.8 g/dL (ref 11.1–15.9)
MCH: 29.8 pg (ref 26.6–33.0)
MCHC: 33.4 g/dL (ref 31.5–35.7)
MCV: 89 fL (ref 79–97)
Platelets: 273 10*3/uL (ref 150–450)
RBC: 4.29 x10E6/uL (ref 3.77–5.28)
RDW: 12.9 % (ref 11.7–15.4)
WBC: 9.7 10*3/uL (ref 3.4–10.8)

## 2018-07-16 LAB — LIPID PANEL
Chol/HDL Ratio: 2.2 ratio (ref 0.0–4.4)
Cholesterol, Total: 115 mg/dL (ref 100–199)
HDL: 53 mg/dL (ref >39)
LDL Calculated: 45 mg/dL (ref 0–99)
Triglycerides: 87 mg/dL (ref 0–149)
VLDL Cholesterol Cal: 17 mg/dL (ref 5–40)

## 2018-07-16 LAB — MAGNESIUM: Magnesium: 1.8 mg/dL (ref 1.6–2.3)

## 2018-07-16 NOTE — Telephone Encounter (Signed)
Called and left a msg stating that the repatha was approved and she doesn't need a refill

## 2018-08-07 ENCOUNTER — Telehealth: Payer: Self-pay | Admitting: Interventional Cardiology

## 2018-08-07 NOTE — Telephone Encounter (Signed)
Patient thinks she is gathering fluid around her heart. She would like for the nurse to call her.

## 2018-08-07 NOTE — Telephone Encounter (Signed)
Pt states she has been gaining weight:  3 pounds gained overnight with a total of 5 pounds gained so far this week  No swelling in ankles or legs but has been elevating her legs, pt had colon sx in October so she states it is difficult to tell if she has abdominal swelling d/t colostomy bag  Pt reports keeping an eye on her salt and sugar intake and taking 20mg  Lasix this morning-med list shows pt taking 20 mg Lasix daily but pt states she only takes PRN when she notices swelling or weight gain  She reports worsening SOB with exertion and fatigue./exhaustion with ADL's   Pt is concerned about fluid building up around her heart, will route to [name] for advisement

## 2018-08-08 NOTE — Telephone Encounter (Signed)
Take furosemide 40 mg once to see if weight comes back to baseline.  If not, may repeat 40 mg the next day.  After that she should go back to typical 20 mg daily dose.

## 2018-08-08 NOTE — Telephone Encounter (Signed)
Spoke with pt and went over recommendations per [name].  Pt verbalized understanding and was in agreement with plan.

## 2018-09-17 ENCOUNTER — Telehealth: Payer: Self-pay | Admitting: Radiology

## 2018-09-17 NOTE — Telephone Encounter (Signed)
Called pt and advised that we have not received fax and we will call once we have received fax from Dr. Ace Gins office.

## 2018-09-17 NOTE — Telephone Encounter (Signed)
Patient called states that she has signed a release from Dr. Anselmo Rod office for her records to come here for Dr. Ernestina Patches. She wants to know if they have been rec'd and if she can make an appt with Dr. Ernestina Patches. Please call her to advise.

## 2018-09-25 ENCOUNTER — Telehealth: Payer: Self-pay | Admitting: Interventional Cardiology

## 2018-09-25 ENCOUNTER — Telehealth: Payer: Self-pay | Admitting: Physical Medicine and Rehabilitation

## 2018-09-25 NOTE — H&P (View-Only) (Signed)
Cardiology Office Note:    Date:  09/26/2018   ID:  [name], DOB [birth year], MRN [id number]  PCP:  Crist Infante, MD  Cardiologist:  Sinclair Grooms, MD   Referring MD: Crist Infante, MD   Chief Complaint  Patient presents with  . Atrial Fibrillation    History of Present Illness:    [name] is a 78 y.o. female with a hx of  coronary artery disease with prior bypass grafting, atrial fibrillation resulting in acute diastolic heart failure, hypertension, chronic anticoagulation therapy with Eliquisand hyperlipidemia, and  Amiodarone --> Tykosyn.  Since surgery in December, she has developed progressive shortness of breath..  She also states that she has gained 10 to 12 pounds since surgery in December.  She does not know why.  She denies orthopnea.  No angina.  She is compliant with all her medications.  He lives alone and independently although is having increasing difficulty taking care of herself.   Past Medical History:  Diagnosis Date  . Addison's disease ([place])   . Anxiety   . Arthritis    "everywhere"  . Atrial fibrillation ([facility])    Eliquis  . Chronic bronchitis ([place])    "although I didn't get it this year" (07/23/2017)  . 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  last knee surgery dr. Elmyra Ricks gave large dose of hydrocortisal and avoided the hypotensive side effectas of addison's disease.  . Coronary artery disease   . Depression   . GERD (gastroesophageal reflux disease)   . Hyperlipemia   . Hypertension   . Hypothyroidism   . Lumbar radiculopathy 04/27/2014  . Migraine    "in my 20's" (07/23/2017)  . Recurrent upper respiratory infection (URI)   . UTI (lower urinary tract infection) 07/09/2011    Past Surgical History:  Procedure Laterality Date  . ACHILLES TENDON SURGERY Left 07/16/2014   bone spur removed  . ANTERIOR LUMBAR FUSION  1980   L4-5  . BACK SURGERY    . CARDIAC CATHETERIZATION  2013   . CARDIAC CATHETERIZATION N/A 09/02/2014   Procedure: Left Heart Cath and Coronary Angiography;  Surgeon: Wellington Hampshire, MD;  Location: [facility];  Service: Cardiovascular;  Laterality: N/A;  . CARDIOVERSION N/A 10/22/2015   Procedure: CARDIOVERSION;  Surgeon: Sanda Klein, MD;  Location: [facility];  Service: Cardiovascular;  Laterality: N/A;  . CARDIOVERSION N/A 06/07/2017   Procedure: CARDIOVERSION;  Surgeon: Larey Dresser, MD;  Location: [facility];  Service: Cardiovascular;  Laterality: N/A;  . CARDIOVERSION N/A 07/05/2017   Procedure: CARDIOVERSION;  Surgeon: Pixie Casino, MD;  Location: [place] [place];  Service: Cardiovascular;  Laterality: N/A;  . CARPAL TUNNEL RELEASE Right   . CATARACT EXTRACTION, BILATERAL Bilateral   . COLON RESECTION N/A 03/20/2018   Procedure: LAPAROSCOPIC SIGMOIDECTOMY WITH COLOSTOMY;  Surgeon: Ileana Roup, MD;  Location: [facility];  Service: General;  Laterality: N/A;  . COLON SURGERY     laparascopic vs. open sigmoidectomy with cystoscopy  Dr. Dema Severin and Dr. Gloriann Loan 03-20-18  . COLONOSCOPY WITH PROPOFOL N/A 01/17/2018   Procedure: COLONOSCOPY WITH PROPOFOL;  Surgeon: Milus Banister, MD;  Location: [facility];  Service: Endoscopy;  Laterality: N/A;  . CORONARY ARTERY BYPASS GRAFT  06/26/2011   Procedure: CORONARY ARTERY BYPASS GRAFTING (CABG);  Surgeon: Melrose Nakayama, MD;  Location: [place] [facility];  Service: Open Heart Surgery;  Laterality: N/A;  times using Greater Saphenous Vein Graft harvested  endoscopically from right leg  . CYSTOSCOPY W/ URETERAL STENT PLACEMENT Bilateral 03/20/2018   Procedure: CYSTOSCOPY WITH BILATERAL RETROGRADE AND BILATERAL URETERAL CATHETER PLACEMENT;  Surgeon: Lucas Mallow, MD;  Location: [facility];  Service: Urology;  Laterality: Bilateral;  . DIAGNOSTIC LAPAROSCOPY    . DILATION AND CURETTAGE OF UTERUS    . JOINT REPLACEMENT    . KNEE ARTHROSCOPY Bilateral   . LAPAROSCOPIC CHOLECYSTECTOMY    . POLYPECTOMY   01/17/2018   Procedure: POLYPECTOMY;  Surgeon: Milus Banister, MD;  Location: [facility];  Service: Endoscopy;;  . REVISION TOTAL KNEE ARTHROPLASTY Right   . SPINAL CORD STIMULATOR IMPLANT  12/10/15  . TAKE DOWN OF INTESTINAL FISTULA N/A 03/20/2018   Procedure: LAPAROSCOPIC TAKE DOWN OF COLOVESICAL FISTULA;  Surgeon: Ileana Roup, MD;  Location: [facility];  Service: General;  Laterality: N/A;  . TONSILLECTOMY    . TOTAL KNEE ARTHROPLASTY Bilateral     Current Medications: Current Meds  Medication Sig  . acetaminophen (TYLENOL) 500 MG tablet Take 500 mg by mouth daily as needed for headache.  [name] apixaban (ELIQUIS) 5 MG TABS tablet Take 5 mg by mouth 2 (two) times daily.  . Ascorbic Acid (VITAMIN C) 1000 MG tablet Take 1,000 mg by mouth daily.   [name] aspirin EC 81 MG tablet Take 81 mg by mouth every Monday, Wednesday, and Friday.  [name] CLONAZEPAM PO Take 0.25 mg by mouth 2 (two) times daily as needed for anxiety.   . Cyanocobalamin 1000 MCG/ML KIT Inject 1 mL into the muscle every 30 (thirty) days. Take one (1) injection as directed every month.  . diltiazem (CARDIZEM CD) 180 MG 24 hr capsule TAKE ONE CAPSULE EACH DAY  . dofetilide (TIKOSYN) 125 MCG capsule TAKE ONE CAPSULE TWICE A DAY  . Evolocumab (REPATHA SURECLICK) 384 MG/ML SOAJ Inject 1 pen into the skin every 14 (fourteen) days.  [name] FLUoxetine (PROZAC) 10 MG tablet Take 10 mg by mouth every morning.   . furosemide (LASIX) 20 MG tablet TAKE ONE TABLET BY MOUTH ONCE DAILY  . gabapentin (NEURONTIN) 100 MG capsule 1 capsule in the morning and midday, 3 in the evening  . hydrocortisone (CORTEF) 20 MG tablet Take 20 mg by mouth 2 (two) times daily.  . hyoscyamine (LEVSIN SL) 0.125 MG SL tablet Place 0.125 mg under the tongue daily as needed for cramping (IBS symptoms).  [name] levothyroxine (SYNTHROID, LEVOTHROID) 88 MCG tablet Take 1 tablet (88 mcg total) by mouth daily before breakfast.  . MAGNESIUM PO Take 1,000 mg by mouth daily.   .  mirabegron ER (MYRBETRIQ) 50 MG TB24 tablet Take 50 mg by mouth daily.  [name] NITROSTAT 0.4 MG SL tablet ONE TABLET UNDER TONGUE AS NEEDED FOR CHEST PAIN AS DIRECTED  . omeprazole (PRILOSEC) 20 MG capsule Take 20 mg by mouth daily.   . polyethylene glycol-electrolytes (NULYTELY/GOLYTELY) 420 g solution Take 4,000 mLs by mouth as directed.  . potassium chloride (K-DUR) 10 MEQ tablet TAKE TWO TABLETS EVERY DAY  . Probiotic Product (PROBIOTIC FORMULA PO) Take 1 capsule by mouth at bedtime.   . pyridoxine (B-6) 200 MG tablet Take 200 mg by mouth daily.  [name] rOPINIRole (REQUIP) 0.25 MG tablet Take 0.5 mg by mouth at bedtime.  . valsartan (DIOVAN) 80 MG tablet Take 80 mg by mouth daily.  . Vitamin D, Ergocalciferol, (DRISDOL) 50000 UNITS CAPS Take 50,000 Units by mouth 2 (two) times a week. Takes on Tuesdays and Fridays     Allergies:  Percocet [oxycodone-acetaminophen], Tramadol, Sulfa antibiotics, and Sulfacetamide sodium   Social History   Socioeconomic History  . Marital status: Widowed    Spouse name: Not on file  . Number of children: Not on file  . Years of education: Not on file  . Highest education level: Not on file  Occupational History  . Occupation: Retired  Scientific laboratory technician  . Financial resource strain: Not on file  . Food insecurity    Worry: Not on file    Inability: Not on file  . Transportation needs    Medical: Not on file    Non-medical: Not on file  Tobacco Use  . Smoking status: Passive Smoke Exposure - Never Smoker  . Smokeless tobacco: Never Used  . Tobacco comment: "husband passed in 2010; father passed 1978"  Substance and Sexual Activity  . Alcohol use: Yes    Alcohol/week: 0.0 standard drinks    Comment: glass of wine occasionally   . Drug use: No  . Sexual activity: Not Currently    Birth control/protection: Post-menopausal  Lifestyle  . Physical activity    Days per week: Not on file    Minutes per session: Not on file  . Stress: Not on file   Relationships  . Social Herbalist on phone: Not on file    Gets together: Not on file    Attends religious service: Not on file    Active member of club or organization: Not on file    Attends meetings of clubs or organizations: Not on file    Relationship status: Not on file  Other Topics Concern  . Not on file  Social History Narrative  . Not on file     Family History: The patient's family history includes Heart attack in her father; Hypertension in her maternal grandfather and mother; Stroke in her mother. There is no history of Colon cancer.  ROS:   Please see the history of present illness.    Anxiety concerning COVID-19 and also concerned about progressive dyspnea.  All other systems reviewed and are negative.  EKGs/Labs/Other Studies Reviewed:    The following studies were reviewed today: 2D Doppler echocardiogram 09/20/2015: Study Conclusions  - Left ventricle: The cavity size was normal. Wall thickness was   increased in a pattern of moderate LVH. Systolic function was   vigorous. The estimated ejection fraction was in the range of 65%   to 70%. - Mitral valve: There was mild regurgitation. - Pulmonary arteries: PA peak pressure: 35 mm Hg (S).  EKG:  EKG atrial flutter/fibrillation at rate of 86 bpm.  Prominent voltage.  When compared to prior tracings most recently on December 22nd 2019, atrial fibrillation/flutter is new.  Recent Labs: 03/15/2018: ALT 21 07/16/2018: BUN 15; Creatinine, Ser 0.81; Hemoglobin 12.8; Magnesium 1.8; Platelets 273; Potassium 4.5; Sodium 143  Recent Lipid Panel    Component Value Date/Time   CHOL 115 07/16/2018 1023   TRIG 87 07/16/2018 1023   HDL 53 07/16/2018 1023   CHOLHDL 2.2 07/16/2018 1023   CHOLHDL 2.5 09/02/2014 0346   VLDL 16 09/02/2014 0346   LDLCALC 45 07/16/2018 1023   LDLDIRECT 135 (H) 02/04/2018 1555    Physical Exam:    VS:  BP (!) 104/56   Pulse 86   Ht 5' 4.5" (1.638 m)   Wt 213 lb 1.9 oz (96.7  kg)   SpO2 98%   BMI 36.02 kg/m     Wt Readings from Last 3 Encounters:  09/26/18 213 lb 1.9 oz (96.7 kg)  07/15/18 200 lb (90.7 kg)  04/02/18 204 lb 12.9 oz (92.9 kg)     GEN: Moderate obesity. No acute distress HEENT: Normal NECK: No JVD. LYMPHATICS: No lymphadenopathy CARDIAC: Irregular RR.  no murmur, nogallop, noedema VASCULAR: 2+ bilateral radial pulses, no bruits RESPIRATORY:  Clear to auscultation without rales, wheezing or rhonchi  ABDOMEN: Soft, non-tender, non-distended, No pulsatile mass, MUSCULOSKELETAL: No deformity  SKIN: Warm and dry NEUROLOGIC:  Alert and oriented x 3 PSYCHIATRIC:  Normal affect   ASSESSMENT:    1. Persistent atrial fibrillation   2. S/P CABG (coronary artery bypass graft)   3. Essential hypertension   4. Hyperlipidemia with target LDL less than 70   5. Chronic anticoagulation   6. On dofetilide therapy   7. Educated About Covid-19 Virus Infection    PLAN:    In order of problems listed above:  1. Course atrial fibrillation/flutter is new.  Based upon her history, perhaps it has been going on for greater than 3 to 4 months.  She needs repeat cardioversion and perhaps a higher dose of dofetilide.  I recommended admission but she refused.  We will try to arrange outpatient cardioversion.  She has been compliant with apixaban therapy.  Procedural risk including bleeding, stroke, musculoskeletal injury, oropharyngeal injury if intubation required, was discussed in detail with the patient. 2. She is having no active ongoing ischemic symptoms. 3. Blood pressure is relatively low but not hypotensive. 4. Not discussed 5. Continue apixaban without missed doses. 6. Continue dofetilide at the current dose.  Consider admission after cardioversion to optimize dofetilide dose if deemed reasonable by the cardiology colleague taking care of her.  Cmet, CBC, magnesium level, BNP are obtained in preparation for elective cardioversion.  She should continue  dofetilide and apixaban as scheduled.  Encouraged the patient to come to the emergency room for admission if she changes her mind.  I recommended admission today but she was against that.   Medication Adjustments/Labs and Tests Ordered: Current medicines are reviewed at length with the patient today.  Concerns regarding medicines are outlined above.  Orders Placed This Encounter  Procedures  . EKG 12-Lead   No orders of the defined types were placed in this encounter.   There are no Patient Instructions on file for this visit.   Signed, Sinclair Grooms, MD  09/26/2018 10:35 AM    [place]

## 2018-09-25 NOTE — Telephone Encounter (Signed)
New Message     Pt c/o Shortness Of Breath: STAT if SOB developed within the last 24 hours or pt is noticeably SOB on the phone  1. Are you currently SOB (can you hear that pt is SOB on the phone)? NO, as long as she's sitting she's fine when she gets up to walk she's short of breath.  2. How long have you been experiencing SOB? Several months and it's getting worse  3. Are you SOB when sitting or when up moving around? When moving around.   4. Are you currently experiencing any other symptoms? Fatigue

## 2018-09-25 NOTE — Telephone Encounter (Signed)
Pt states that she called a couple of months ago with the same issue.  Had phone visit with Kathyrn Drown, NP that went great.  Since then her DOE has continued to worsen.  Pt denies any missed doses of medications.  States HR normally in low 80s but has been running in upper 90s since 09/06/2018.  Oxygen saturation runs 95-97%.  BP has been fine.  No issues when sitting down or resting.  Denies any sx of COVID.  Scheduled pt to come in tomorrow to see [name].  Pt verbalized understanding and was appreciative for call.        COVID-19 Pre-Screening Questions:  . In the past 7 to 10 days have you had a cough,  shortness of breath, headache, congestion, fever (100 or greater) body aches, chills, sore throat, or sudden loss of taste or sense of smell? No . Have you been around anyone with known Covid 19? No . Have you been around anyone who is awaiting Covid 19 test results in the past 7 to 10 days? No . Have you been around anyone who has been exposed to Covid 19, or has mentioned symptoms of Covid 19 within the past 7 to 10 days? No  If you have any concerns/questions about symptoms patients report during screening (either on the phone or at [facility]). Contact the provider seeing the patient or DOD for further guidance.  If neither are available contact a member of the leadership team.

## 2018-09-25 NOTE — Progress Notes (Signed)
Cardiology Office Note:    Date:  09/26/2018   ID:  [name], DOB [birth year], MRN [id number]  PCP:  Crist Infante, MD  Cardiologist:  Sinclair Grooms, MD   Referring MD: Crist Infante, MD   Chief Complaint  Patient presents with  . Atrial Fibrillation    History of Present Illness:    [name] is a 78 y.o. female with a hx of  coronary artery disease with prior bypass grafting, atrial fibrillation resulting in acute diastolic heart failure, hypertension, chronic anticoagulation therapy with Eliquisand hyperlipidemia, and  Amiodarone --> Tykosyn.  Since surgery in December, she has developed progressive shortness of breath..  She also states that she has gained 10 to 12 pounds since surgery in December.  She does not know why.  She denies orthopnea.  No angina.  She is compliant with all her medications.  He lives alone and independently although is having increasing difficulty taking care of herself.   Past Medical History:  Diagnosis Date  . Addison's disease ([place])   . Anxiety   . Arthritis    "everywhere"  . Atrial fibrillation ([facility])    Eliquis  . Chronic bronchitis ([place])    "although I didn't get it this year" (07/23/2017)  . 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  last knee surgery dr. Elmyra Ricks gave large dose of hydrocortisal and avoided the hypotensive side effectas of addison's disease.  . Coronary artery disease   . Depression   . GERD (gastroesophageal reflux disease)   . Hyperlipemia   . Hypertension   . Hypothyroidism   . Lumbar radiculopathy 04/27/2014  . Migraine    "in my 20's" (07/23/2017)  . Recurrent upper respiratory infection (URI)   . UTI (lower urinary tract infection) 07/09/2011    Past Surgical History:  Procedure Laterality Date  . ACHILLES TENDON SURGERY Left 07/16/2014   bone spur removed  . ANTERIOR LUMBAR FUSION  1980   L4-5  . BACK SURGERY    . CARDIAC CATHETERIZATION  2013   . CARDIAC CATHETERIZATION N/A 09/02/2014   Procedure: Left Heart Cath and Coronary Angiography;  Surgeon: Wellington Hampshire, MD;  Location: [facility];  Service: Cardiovascular;  Laterality: N/A;  . CARDIOVERSION N/A 10/22/2015   Procedure: CARDIOVERSION;  Surgeon: Sanda Klein, MD;  Location: [facility];  Service: Cardiovascular;  Laterality: N/A;  . CARDIOVERSION N/A 06/07/2017   Procedure: CARDIOVERSION;  Surgeon: Larey Dresser, MD;  Location: [facility];  Service: Cardiovascular;  Laterality: N/A;  . CARDIOVERSION N/A 07/05/2017   Procedure: CARDIOVERSION;  Surgeon: Pixie Casino, MD;  Location: [place];  Service: Cardiovascular;  Laterality: N/A;  . CARPAL TUNNEL RELEASE Right   . CATARACT EXTRACTION, BILATERAL Bilateral   . COLON RESECTION N/A 03/20/2018   Procedure: LAPAROSCOPIC SIGMOIDECTOMY WITH COLOSTOMY;  Surgeon: Ileana Roup, MD;  Location: [facility];  Service: General;  Laterality: N/A;  . COLON SURGERY     laparascopic vs. open sigmoidectomy with cystoscopy  Dr. Dema Severin and Dr. Gloriann Loan 03-20-18  . COLONOSCOPY WITH PROPOFOL N/A 01/17/2018   Procedure: COLONOSCOPY WITH PROPOFOL;  Surgeon: Milus Banister, MD;  Location: [facility];  Service: Endoscopy;  Laterality: N/A;  . CORONARY ARTERY BYPASS GRAFT  06/26/2011   Procedure: CORONARY ARTERY BYPASS GRAFTING (CABG);  Surgeon: Melrose Nakayama, MD;  Location: [place];  Service: Open Heart Surgery;  Laterality: N/A;  times using Greater Saphenous Vein Graft harvested  endoscopically from right leg  . CYSTOSCOPY W/ URETERAL STENT PLACEMENT Bilateral 03/20/2018   Procedure: CYSTOSCOPY WITH BILATERAL RETROGRADE AND BILATERAL URETERAL CATHETER PLACEMENT;  Surgeon: Lucas Mallow, MD;  Location: [facility];  Service: Urology;  Laterality: Bilateral;  . DIAGNOSTIC LAPAROSCOPY    . DILATION AND CURETTAGE OF UTERUS    . JOINT REPLACEMENT    . KNEE ARTHROSCOPY Bilateral   . LAPAROSCOPIC CHOLECYSTECTOMY    . POLYPECTOMY   01/17/2018   Procedure: POLYPECTOMY;  Surgeon: Milus Banister, MD;  Location: [facility];  Service: Endoscopy;;  . REVISION TOTAL KNEE ARTHROPLASTY Right   . SPINAL CORD STIMULATOR IMPLANT  12/10/15  . TAKE DOWN OF INTESTINAL FISTULA N/A 03/20/2018   Procedure: LAPAROSCOPIC TAKE DOWN OF COLOVESICAL FISTULA;  Surgeon: Ileana Roup, MD;  Location: [facility];  Service: General;  Laterality: N/A;  . TONSILLECTOMY    . TOTAL KNEE ARTHROPLASTY Bilateral     Current Medications: Current Meds  Medication Sig  . acetaminophen (TYLENOL) 500 MG tablet Take 500 mg by mouth daily as needed for headache.  [name] apixaban (ELIQUIS) 5 MG TABS tablet Take 5 mg by mouth 2 (two) times daily.  . Ascorbic Acid (VITAMIN C) 1000 MG tablet Take 1,000 mg by mouth daily.   [name] aspirin EC 81 MG tablet Take 81 mg by mouth every Monday, Wednesday, and Friday.  [name] CLONAZEPAM PO Take 0.25 mg by mouth 2 (two) times daily as needed for anxiety.   . Cyanocobalamin 1000 MCG/ML KIT Inject 1 mL into the muscle every 30 (thirty) days. Take one (1) injection as directed every month.  . diltiazem (CARDIZEM CD) 180 MG 24 hr capsule TAKE ONE CAPSULE EACH DAY  . dofetilide (TIKOSYN) 125 MCG capsule TAKE ONE CAPSULE TWICE A DAY  . Evolocumab (REPATHA SURECLICK) 384 MG/ML SOAJ Inject 1 pen into the skin every 14 (fourteen) days.  [name] FLUoxetine (PROZAC) 10 MG tablet Take 10 mg by mouth every morning.   . furosemide (LASIX) 20 MG tablet TAKE ONE TABLET BY MOUTH ONCE DAILY  . gabapentin (NEURONTIN) 100 MG capsule 1 capsule in the morning and midday, 3 in the evening  . hydrocortisone (CORTEF) 20 MG tablet Take 20 mg by mouth 2 (two) times daily.  . hyoscyamine (LEVSIN SL) 0.125 MG SL tablet Place 0.125 mg under the tongue daily as needed for cramping (IBS symptoms).  [name] levothyroxine (SYNTHROID, LEVOTHROID) 88 MCG tablet Take 1 tablet (88 mcg total) by mouth daily before breakfast.  . MAGNESIUM PO Take 1,000 mg by mouth daily.   .  mirabegron ER (MYRBETRIQ) 50 MG TB24 tablet Take 50 mg by mouth daily.  [name] NITROSTAT 0.4 MG SL tablet ONE TABLET UNDER TONGUE AS NEEDED FOR CHEST PAIN AS DIRECTED  . omeprazole (PRILOSEC) 20 MG capsule Take 20 mg by mouth daily.   . polyethylene glycol-electrolytes (NULYTELY/GOLYTELY) 420 g solution Take 4,000 mLs by mouth as directed.  . potassium chloride (K-DUR) 10 MEQ tablet TAKE TWO TABLETS EVERY DAY  . Probiotic Product (PROBIOTIC FORMULA PO) Take 1 capsule by mouth at bedtime.   . pyridoxine (B-6) 200 MG tablet Take 200 mg by mouth daily.  [name] rOPINIRole (REQUIP) 0.25 MG tablet Take 0.5 mg by mouth at bedtime.  . valsartan (DIOVAN) 80 MG tablet Take 80 mg by mouth daily.  . Vitamin D, Ergocalciferol, (DRISDOL) 50000 UNITS CAPS Take 50,000 Units by mouth 2 (two) times a week. Takes on Tuesdays and Fridays     Allergies:  Percocet [oxycodone-acetaminophen], Tramadol, Sulfa antibiotics, and Sulfacetamide sodium   Social History   Socioeconomic History  . Marital status: Widowed    Spouse name: Not on file  . Number of children: Not on file  . Years of education: Not on file  . Highest education level: Not on file  Occupational History  . Occupation: Retired  Scientific laboratory technician  . Financial resource strain: Not on file  . Food insecurity    Worry: Not on file    Inability: Not on file  . Transportation needs    Medical: Not on file    Non-medical: Not on file  Tobacco Use  . Smoking status: Passive Smoke Exposure - Never Smoker  . Smokeless tobacco: Never Used  . Tobacco comment: "husband passed in 2010; father passed 1978"  Substance and Sexual Activity  . Alcohol use: Yes    Alcohol/week: 0.0 standard drinks    Comment: glass of wine occasionally   . Drug use: No  . Sexual activity: Not Currently    Birth control/protection: Post-menopausal  Lifestyle  . Physical activity    Days per week: Not on file    Minutes per session: Not on file  . Stress: Not on file   Relationships  . Social Herbalist on phone: Not on file    Gets together: Not on file    Attends religious service: Not on file    Active member of club or organization: Not on file    Attends meetings of clubs or organizations: Not on file    Relationship status: Not on file  Other Topics Concern  . Not on file  Social History Narrative  . Not on file     Family History: The patient's family history includes Heart attack in her father; Hypertension in her maternal grandfather and mother; Stroke in her mother. There is no history of Colon cancer.  ROS:   Please see the history of present illness.    Anxiety concerning COVID-19 and also concerned about progressive dyspnea.  All other systems reviewed and are negative.  EKGs/Labs/Other Studies Reviewed:    The following studies were reviewed today: 2D Doppler echocardiogram 09/20/2015: Study Conclusions  - Left ventricle: The cavity size was normal. Wall thickness was   increased in a pattern of moderate LVH. Systolic function was   vigorous. The estimated ejection fraction was in the range of 65%   to 70%. - Mitral valve: There was mild regurgitation. - Pulmonary arteries: PA peak pressure: 35 mm Hg (S).  EKG:  EKG atrial flutter/fibrillation at rate of 86 bpm.  Prominent voltage.  When compared to prior tracings most recently on December 22nd 2019, atrial fibrillation/flutter is new.  Recent Labs: 03/15/2018: ALT 21 07/16/2018: BUN 15; Creatinine, Ser 0.81; Hemoglobin 12.8; Magnesium 1.8; Platelets 273; Potassium 4.5; Sodium 143  Recent Lipid Panel    Component Value Date/Time   CHOL 115 07/16/2018 1023   TRIG 87 07/16/2018 1023   HDL 53 07/16/2018 1023   CHOLHDL 2.2 07/16/2018 1023   CHOLHDL 2.5 09/02/2014 0346   VLDL 16 09/02/2014 0346   LDLCALC 45 07/16/2018 1023   LDLDIRECT 135 (H) 02/04/2018 1555    Physical Exam:    VS:  BP (!) 104/56   Pulse 86   Ht 5' 4.5" (1.638 m)   Wt 213 lb 1.9 oz (96.7  kg)   SpO2 98%   BMI 36.02 kg/m     Wt Readings from Last 3 Encounters:  09/26/18 213 lb 1.9 oz (96.7 kg)  07/15/18 200 lb (90.7 kg)  04/02/18 204 lb 12.9 oz (92.9 kg)     GEN: Moderate obesity. No acute distress HEENT: Normal NECK: No JVD. LYMPHATICS: No lymphadenopathy CARDIAC: Irregular RR.  no murmur, nogallop, noedema VASCULAR: 2+ bilateral radial pulses, no bruits RESPIRATORY:  Clear to auscultation without rales, wheezing or rhonchi  ABDOMEN: Soft, non-tender, non-distended, No pulsatile mass, MUSCULOSKELETAL: No deformity  SKIN: Warm and dry NEUROLOGIC:  Alert and oriented x 3 PSYCHIATRIC:  Normal affect   ASSESSMENT:    1. Persistent atrial fibrillation   2. S/P CABG (coronary artery bypass graft)   3. Essential hypertension   4. Hyperlipidemia with target LDL less than 70   5. Chronic anticoagulation   6. On dofetilide therapy   7. Educated About Covid-19 Virus Infection    PLAN:    In order of problems listed above:  1. Course atrial fibrillation/flutter is new.  Based upon her history, perhaps it has been going on for greater than 3 to 4 months.  She needs repeat cardioversion and perhaps a higher dose of dofetilide.  I recommended admission but she refused.  We will try to arrange outpatient cardioversion.  She has been compliant with apixaban therapy.  Procedural risk including bleeding, stroke, musculoskeletal injury, oropharyngeal injury if intubation required, was discussed in detail with the patient. 2. She is having no active ongoing ischemic symptoms. 3. Blood pressure is relatively low but not hypotensive. 4. Not discussed 5. Continue apixaban without missed doses. 6. Continue dofetilide at the current dose.  Consider admission after cardioversion to optimize dofetilide dose if deemed reasonable by the cardiology colleague taking care of her.  Cmet, CBC, magnesium level, BNP are obtained in preparation for elective cardioversion.  She should continue  dofetilide and apixaban as scheduled.  Encouraged the patient to come to the emergency room for admission if she changes her mind.  I recommended admission today but she was against that.   Medication Adjustments/Labs and Tests Ordered: Current medicines are reviewed at length with the patient today.  Concerns regarding medicines are outlined above.  Orders Placed This Encounter  Procedures  . EKG 12-Lead   No orders of the defined types were placed in this encounter.   There are no Patient Instructions on file for this visit.   Signed, Sinclair Grooms, MD  09/26/2018 10:35 AM    [place]

## 2018-09-26 ENCOUNTER — Other Ambulatory Visit: Payer: Self-pay

## 2018-09-26 ENCOUNTER — Ambulatory Visit: Payer: Medicare Other | Admitting: Interventional Cardiology

## 2018-09-26 ENCOUNTER — Encounter: Payer: Self-pay | Admitting: *Deleted

## 2018-09-26 ENCOUNTER — Encounter: Payer: Self-pay | Admitting: Interventional Cardiology

## 2018-09-26 VITALS — BP 104/56 | HR 86 | Ht 64.5 in | Wt 213.1 lb

## 2018-09-26 DIAGNOSIS — I4819 Other persistent atrial fibrillation: Secondary | ICD-10-CM | POA: Diagnosis not present

## 2018-09-26 DIAGNOSIS — E785 Hyperlipidemia, unspecified: Secondary | ICD-10-CM

## 2018-09-26 DIAGNOSIS — I10 Essential (primary) hypertension: Secondary | ICD-10-CM | POA: Diagnosis not present

## 2018-09-26 DIAGNOSIS — Z7189 Other specified counseling: Secondary | ICD-10-CM

## 2018-09-26 DIAGNOSIS — Z79899 Other long term (current) drug therapy: Secondary | ICD-10-CM

## 2018-09-26 DIAGNOSIS — Z951 Presence of aortocoronary bypass graft: Secondary | ICD-10-CM

## 2018-09-26 DIAGNOSIS — Z7901 Long term (current) use of anticoagulants: Secondary | ICD-10-CM

## 2018-09-26 LAB — CBC
Hematocrit: 40.6 % (ref 34.0–46.6)
Hemoglobin: 13.2 g/dL (ref 11.1–15.9)
MCH: 28.1 pg (ref 26.6–33.0)
MCHC: 32.5 g/dL (ref 31.5–35.7)
MCV: 86 fL (ref 79–97)
Platelets: 306 10*3/uL (ref 150–450)
RBC: 4.7 x10E6/uL (ref 3.77–5.28)
RDW: 12.7 % (ref 11.7–15.4)
WBC: 10.9 10*3/uL — ABNORMAL HIGH (ref 3.4–10.8)

## 2018-09-26 LAB — HEPATIC FUNCTION PANEL
ALT: 17 IU/L (ref 0–32)
AST: 20 IU/L (ref 0–40)
Albumin: 4 g/dL (ref 3.7–4.7)
Alkaline Phosphatase: 68 IU/L (ref 39–117)
Bilirubin Total: 0.6 mg/dL (ref 0.0–1.2)
Bilirubin, Direct: 0.18 mg/dL (ref 0.00–0.40)
Total Protein: 6.1 g/dL (ref 6.0–8.5)

## 2018-09-26 LAB — BASIC METABOLIC PANEL
BUN/Creatinine Ratio: 20 (ref 12–28)
BUN: 20 mg/dL (ref 8–27)
CO2: 24 mmol/L (ref 20–29)
Calcium: 9.4 mg/dL (ref 8.7–10.3)
Chloride: 102 mmol/L (ref 96–106)
Creatinine, Ser: 0.99 mg/dL (ref 0.57–1.00)
GFR calc Af Amer: 63 mL/min/{1.73_m2} (ref >59)
GFR calc non Af Amer: 54 mL/min/{1.73_m2} — ABNORMAL LOW (ref >59)
Glucose: 196 mg/dL — ABNORMAL HIGH (ref 65–99)
Potassium: 4.3 mmol/L (ref 3.5–5.2)
Sodium: 140 mmol/L (ref 134–144)

## 2018-09-26 LAB — MAGNESIUM: Magnesium: 2.1 mg/dL (ref 1.6–2.3)

## 2018-09-26 LAB — PRO B NATRIURETIC PEPTIDE: NT-Pro BNP: 848 pg/mL — ABNORMAL HIGH (ref 0–738)

## 2018-09-26 NOTE — Patient Instructions (Signed)
Medication Instructions:  Your physician recommends that you continue on your current medications as directed. Please refer to the Current Medication list given to you today.  If you need a refill on your cardiac medications before your next appointment, please call your pharmacy.   Lab work: BMET, Magnesium, Liver, Pro BNP and CBC today  If you have labs (blood work) drawn today and your tests are completely normal, you will receive your results only by: [name] MyChart Message (if you have MyChart) OR . A paper copy in the mail If you have any lab test that is abnormal or we need to change your treatment, we will call you to review the results.  Testing/Procedures: Your physician has recommended that you have a Cardioversion (DCCV). Electrical Cardioversion uses a jolt of electricity to your heart either through paddles or wired patches attached to your chest. This is a controlled, usually prescheduled, procedure. Defibrillation is done under light anesthesia in the hospital, and you usually go home the day of the procedure. This is done to get your heart back into a normal rhythm. You are not awake for the procedure. Please see the instruction sheet given to you today.   Follow-Up: Your physician recommends that you schedule a follow-up appointment with the Atrial Fib clinic after your Cardioversion.   Any Other Special Instructions Will Be Listed Below (If Applicable).

## 2018-09-27 ENCOUNTER — Other Ambulatory Visit (HOSPITAL_COMMUNITY)
Admission: RE | Admit: 2018-09-27 | Discharge: 2018-09-27 | Disposition: A | Discharge status: Home / Self Care | Payer: Medicare Other | Source: Ambulatory Visit | Attending: Cardiology | Admitting: Cardiology

## 2018-09-27 DIAGNOSIS — Z9049 Acquired absence of other specified parts of digestive tract: Secondary | ICD-10-CM | POA: Diagnosis not present

## 2018-09-27 DIAGNOSIS — I4819 Other persistent atrial fibrillation: Secondary | ICD-10-CM | POA: Diagnosis not present

## 2018-09-27 DIAGNOSIS — F419 Anxiety disorder, unspecified: Secondary | ICD-10-CM | POA: Diagnosis not present

## 2018-09-27 DIAGNOSIS — Z79899 Other long term (current) drug therapy: Secondary | ICD-10-CM | POA: Diagnosis not present

## 2018-09-27 DIAGNOSIS — I5032 Chronic diastolic (congestive) heart failure: Secondary | ICD-10-CM | POA: Diagnosis not present

## 2018-09-27 DIAGNOSIS — K219 Gastro-esophageal reflux disease without esophagitis: Secondary | ICD-10-CM | POA: Diagnosis not present

## 2018-09-27 DIAGNOSIS — Z7982 Long term (current) use of aspirin: Secondary | ICD-10-CM | POA: Diagnosis not present

## 2018-09-27 DIAGNOSIS — F329 Major depressive disorder, single episode, unspecified: Secondary | ICD-10-CM | POA: Diagnosis not present

## 2018-09-27 DIAGNOSIS — Z888 Allergy status to other drugs, medicaments and biological substances status: Secondary | ICD-10-CM | POA: Diagnosis not present

## 2018-09-27 DIAGNOSIS — Z882 Allergy status to sulfonamides status: Secondary | ICD-10-CM | POA: Diagnosis not present

## 2018-09-27 DIAGNOSIS — Z7901 Long term (current) use of anticoagulants: Secondary | ICD-10-CM | POA: Diagnosis not present

## 2018-09-27 DIAGNOSIS — M1990 Unspecified osteoarthritis, unspecified site: Secondary | ICD-10-CM | POA: Diagnosis not present

## 2018-09-27 DIAGNOSIS — Z8249 Family history of ischemic heart disease and other diseases of the circulatory system: Secondary | ICD-10-CM | POA: Diagnosis not present

## 2018-09-27 DIAGNOSIS — E271 Primary adrenocortical insufficiency: Secondary | ICD-10-CM | POA: Diagnosis not present

## 2018-09-27 DIAGNOSIS — I110 Hypertensive heart disease with heart failure: Secondary | ICD-10-CM | POA: Diagnosis not present

## 2018-09-27 DIAGNOSIS — E785 Hyperlipidemia, unspecified: Secondary | ICD-10-CM | POA: Diagnosis not present

## 2018-09-27 DIAGNOSIS — I2510 Atherosclerotic heart disease of native coronary artery without angina pectoris: Secondary | ICD-10-CM | POA: Diagnosis not present

## 2018-09-27 DIAGNOSIS — Z951 Presence of aortocoronary bypass graft: Secondary | ICD-10-CM | POA: Diagnosis not present

## 2018-09-27 DIAGNOSIS — E039 Hypothyroidism, unspecified: Secondary | ICD-10-CM | POA: Diagnosis not present

## 2018-09-27 DIAGNOSIS — Z885 Allergy status to narcotic agent status: Secondary | ICD-10-CM | POA: Diagnosis not present

## 2018-09-27 DIAGNOSIS — Z1159 Encounter for screening for other viral diseases: Secondary | ICD-10-CM | POA: Diagnosis not present

## 2018-09-27 LAB — SARS CORONAVIRUS 2 (TAT 6-24 HRS): SARS Coronavirus 2: NEGATIVE

## 2018-09-30 ENCOUNTER — Ambulatory Visit (HOSPITAL_COMMUNITY): Payer: Medicare Other | Admitting: Registered Nurse

## 2018-09-30 ENCOUNTER — Encounter (HOSPITAL_COMMUNITY)
Admission: RE | Disposition: A | Discharge status: Home / Self Care | Payer: Self-pay | Source: Home / Self Care | Attending: Cardiology

## 2018-09-30 ENCOUNTER — Encounter (HOSPITAL_COMMUNITY): Payer: Self-pay | Admitting: *Deleted

## 2018-09-30 ENCOUNTER — Ambulatory Visit (HOSPITAL_COMMUNITY)
Admission: RE | Admit: 2018-09-30 | Discharge: 2018-09-30 | Disposition: A | Discharge status: Home / Self Care | Payer: Medicare Other | Attending: Cardiology | Admitting: Cardiology

## 2018-09-30 DIAGNOSIS — Z882 Allergy status to sulfonamides status: Secondary | ICD-10-CM | POA: Insufficient documentation

## 2018-09-30 DIAGNOSIS — Z885 Allergy status to narcotic agent status: Secondary | ICD-10-CM | POA: Insufficient documentation

## 2018-09-30 DIAGNOSIS — I480 Paroxysmal atrial fibrillation: Secondary | ICD-10-CM

## 2018-09-30 DIAGNOSIS — Z1159 Encounter for screening for other viral diseases: Secondary | ICD-10-CM | POA: Diagnosis not present

## 2018-09-30 DIAGNOSIS — E271 Primary adrenocortical insufficiency: Secondary | ICD-10-CM | POA: Insufficient documentation

## 2018-09-30 DIAGNOSIS — I4819 Other persistent atrial fibrillation: Secondary | ICD-10-CM | POA: Insufficient documentation

## 2018-09-30 DIAGNOSIS — I110 Hypertensive heart disease with heart failure: Secondary | ICD-10-CM | POA: Diagnosis not present

## 2018-09-30 DIAGNOSIS — Z9049 Acquired absence of other specified parts of digestive tract: Secondary | ICD-10-CM | POA: Insufficient documentation

## 2018-09-30 DIAGNOSIS — I5032 Chronic diastolic (congestive) heart failure: Secondary | ICD-10-CM | POA: Insufficient documentation

## 2018-09-30 DIAGNOSIS — Z79899 Other long term (current) drug therapy: Secondary | ICD-10-CM | POA: Insufficient documentation

## 2018-09-30 DIAGNOSIS — Z951 Presence of aortocoronary bypass graft: Secondary | ICD-10-CM | POA: Insufficient documentation

## 2018-09-30 DIAGNOSIS — E039 Hypothyroidism, unspecified: Secondary | ICD-10-CM | POA: Insufficient documentation

## 2018-09-30 DIAGNOSIS — Z7982 Long term (current) use of aspirin: Secondary | ICD-10-CM | POA: Insufficient documentation

## 2018-09-30 DIAGNOSIS — I2510 Atherosclerotic heart disease of native coronary artery without angina pectoris: Secondary | ICD-10-CM | POA: Insufficient documentation

## 2018-09-30 DIAGNOSIS — Z7901 Long term (current) use of anticoagulants: Secondary | ICD-10-CM | POA: Insufficient documentation

## 2018-09-30 DIAGNOSIS — F419 Anxiety disorder, unspecified: Secondary | ICD-10-CM | POA: Insufficient documentation

## 2018-09-30 DIAGNOSIS — E785 Hyperlipidemia, unspecified: Secondary | ICD-10-CM | POA: Insufficient documentation

## 2018-09-30 DIAGNOSIS — M1990 Unspecified osteoarthritis, unspecified site: Secondary | ICD-10-CM | POA: Insufficient documentation

## 2018-09-30 DIAGNOSIS — F329 Major depressive disorder, single episode, unspecified: Secondary | ICD-10-CM | POA: Insufficient documentation

## 2018-09-30 DIAGNOSIS — K219 Gastro-esophageal reflux disease without esophagitis: Secondary | ICD-10-CM | POA: Insufficient documentation

## 2018-09-30 DIAGNOSIS — Z8249 Family history of ischemic heart disease and other diseases of the circulatory system: Secondary | ICD-10-CM | POA: Insufficient documentation

## 2018-09-30 DIAGNOSIS — Z888 Allergy status to other drugs, medicaments and biological substances status: Secondary | ICD-10-CM | POA: Insufficient documentation

## 2018-09-30 HISTORY — PX: CARDIOVERSION: SHX1299

## 2018-09-30 SURGERY — CARDIOVERSION
Anesthesia: General

## 2018-09-30 MED ORDER — PROPOFOL 10 MG/ML IV BOLUS
INTRAVENOUS | Status: DC | PRN
  Administered 2018-09-30: 20 mg via INTRAVENOUS
  Administered 2018-09-30: 50 mg via INTRAVENOUS

## 2018-09-30 MED ORDER — SODIUM CHLORIDE 0.9 % IV SOLN
INTRAVENOUS | Status: DC
  Administered 2018-09-30: 09:00:00 via INTRAVENOUS

## 2018-09-30 MED ORDER — LIDOCAINE 2% (20 MG/ML) 5 ML SYRINGE
INTRAMUSCULAR | Status: DC | PRN
  Administered 2018-09-30: 40 mg via INTRAVENOUS

## 2018-09-30 NOTE — Anesthesia Procedure Notes (Signed)
Date/Time: 09/30/2018 9:35 AM Performed by: Trinna Post., CRNA Pre-anesthesia Checklist: Patient identified, Emergency Drugs available, Suction available, Patient being monitored and Timeout performed Patient Re-evaluated:Patient Re-evaluated prior to induction Oxygen Delivery Method: Ambu bag Preoxygenation: Pre-oxygenation with 100% oxygen Induction Type: IV induction Placement Confirmation: positive ETCO2

## 2018-09-30 NOTE — Interval H&P Note (Signed)
History and Physical Interval Note:  09/30/2018 9:16 AM  [name]  has presented today for surgery, with the diagnosis of AFIB.  The various methods of treatment have been discussed with the patient and family. After consideration of risks, benefits and other options for treatment, the patient has consented to  Procedure(s): CARDIOVERSION (N/A) as a surgical intervention.  The patient's history has been reviewed, patient examined, no change in status, stable for surgery.  I have reviewed the patient's chart and labs.  Questions were answered to the patient's satisfaction.     Tekesha Almgren Harrell Gave

## 2018-09-30 NOTE — CV Procedure (Signed)
Procedure:   DCCV  Indication:  Symptomatic atrial fibrillation  Procedure Note:  The patient signed informed consent.  She has had had therapeutic anticoagulation with apixaban greater than 3 weeks.  Anesthesia was administered by Dr. Nyoka Cowden.  Adequate airway was maintained throughout and vital followed per protocol.  She was cardioverted x 3 with 120, 150, then 200J of biphasic synchronized energy.  He converted to NSR, though there were frequent PACs noted.  There were no apparent complications.  The patient had normal neuro status and respiratory status post procedure with vitals stable as recorded elsewhere.    Follow up:  She will continue on current medical therapy.  She will keep her scheduled cardiology follow up.  Buford Dresser, MD PhD 09/30/2018 9:47 AM

## 2018-09-30 NOTE — Anesthesia Postprocedure Evaluation (Signed)
Anesthesia Post Note  Patient: [name]  Procedure(s) Performed: CARDIOVERSION (N/A )     Patient location during evaluation: PACU Anesthesia Type: General Level of consciousness: awake Pain management: pain level controlled Vital Signs Assessment: post-procedure vital signs reviewed and stable Respiratory status: spontaneous breathing Postop Assessment: no apparent nausea or vomiting Anesthetic complications: no    Last Vitals:  Vitals:   09/30/18 0959 09/30/18 1012  BP: 136/69 (!) 137/59  Pulse: 73 67  Resp: (!) 21 (!) 21  Temp:    SpO2: 97% 99%    Last Pain:  Vitals:   09/30/18 1012  TempSrc:   PainSc: 0-No pain                 Keston Seever

## 2018-09-30 NOTE — Anesthesia Preprocedure Evaluation (Signed)
Anesthesia Evaluation  Patient identified by MRN, date of birth, ID band  Airway Mallampati: II  TM Distance: >3 FB     Dental   Pulmonary    breath sounds clear to auscultation       Cardiovascular hypertension, + angina + DOE   Rhythm:Regular Rate:Normal     Neuro/Psych    GI/Hepatic GERD  ,  Endo/Other    Renal/GU      Musculoskeletal   Abdominal   Peds  Hematology   Anesthesia Other Findings   Reproductive/Obstetrics                             Anesthesia Physical Anesthesia Plan  ASA: III  Anesthesia Plan: General   Post-op Pain Management:    Induction: Intravenous  PONV Risk Score and Plan: Treatment may vary due to age or medical condition  Airway Management Planned: Mask and Simple Face Mask  Additional Equipment:   Intra-op Plan:   Post-operative Plan:   Informed Consent: I have reviewed the patients History and Physical, chart, labs and discussed the procedure including the risks, benefits and alternatives for the proposed anesthesia with the patient or authorized representative who has indicated his/her understanding and acceptance.     Dental advisory given  Plan Discussed with: CRNA and Anesthesiologist  Anesthesia Plan Comments:         Anesthesia Quick Evaluation

## 2018-09-30 NOTE — Transfer of Care (Signed)
Immediate Anesthesia Transfer of Care Note  Patient: [name]  Procedure(s) Performed: CARDIOVERSION (N/A )  Patient Location: PACU and Endoscopy Unit  Anesthesia Type:General  Level of Consciousness: drowsy  Airway & Oxygen Therapy: Patient Spontanous Breathing  Post-op Assessment: Report given to RN and Post -op Vital signs reviewed and stable  Post vital signs: Reviewed and stable  Last Vitals:  Vitals Value Taken Time  BP    Temp    Pulse    Resp    SpO2      Last Pain:  Vitals:   09/30/18 0915  TempSrc: Temporal  PainSc: 0-No pain         Complications: No apparent anesthesia complications

## 2018-10-01 ENCOUNTER — Encounter (HOSPITAL_COMMUNITY): Payer: Self-pay | Admitting: Cardiology

## 2018-10-07 ENCOUNTER — Other Ambulatory Visit: Payer: Self-pay

## 2018-10-07 ENCOUNTER — Ambulatory Visit (HOSPITAL_COMMUNITY)
Admission: RE | Admit: 2018-10-07 | Discharge: 2018-10-07 | Disposition: A | Discharge status: Home / Self Care | Payer: Medicare Other | Source: Ambulatory Visit | Attending: Nurse Practitioner | Admitting: Nurse Practitioner

## 2018-10-07 ENCOUNTER — Other Ambulatory Visit (HOSPITAL_COMMUNITY): Payer: Self-pay | Admitting: *Deleted

## 2018-10-07 ENCOUNTER — Encounter (HOSPITAL_COMMUNITY): Payer: Self-pay | Admitting: Nurse Practitioner

## 2018-10-07 VITALS — BP 94/62 | HR 87 | Ht 64.5 in | Wt 210.0 lb

## 2018-10-07 DIAGNOSIS — Z7982 Long term (current) use of aspirin: Secondary | ICD-10-CM | POA: Diagnosis not present

## 2018-10-07 DIAGNOSIS — Z7901 Long term (current) use of anticoagulants: Secondary | ICD-10-CM | POA: Insufficient documentation

## 2018-10-07 DIAGNOSIS — Z79899 Other long term (current) drug therapy: Secondary | ICD-10-CM | POA: Insufficient documentation

## 2018-10-07 DIAGNOSIS — I4819 Other persistent atrial fibrillation: Secondary | ICD-10-CM | POA: Diagnosis not present

## 2018-10-07 DIAGNOSIS — K219 Gastro-esophageal reflux disease without esophagitis: Secondary | ICD-10-CM | POA: Insufficient documentation

## 2018-10-07 DIAGNOSIS — E271 Primary adrenocortical insufficiency: Secondary | ICD-10-CM | POA: Insufficient documentation

## 2018-10-07 DIAGNOSIS — Z79890 Hormone replacement therapy (postmenopausal): Secondary | ICD-10-CM | POA: Insufficient documentation

## 2018-10-07 DIAGNOSIS — I480 Paroxysmal atrial fibrillation: Secondary | ICD-10-CM | POA: Insufficient documentation

## 2018-10-07 DIAGNOSIS — I25810 Atherosclerosis of coronary artery bypass graft(s) without angina pectoris: Secondary | ICD-10-CM | POA: Insufficient documentation

## 2018-10-07 DIAGNOSIS — E039 Hypothyroidism, unspecified: Secondary | ICD-10-CM | POA: Insufficient documentation

## 2018-10-07 DIAGNOSIS — F419 Anxiety disorder, unspecified: Secondary | ICD-10-CM | POA: Diagnosis not present

## 2018-10-07 DIAGNOSIS — Z96653 Presence of artificial knee joint, bilateral: Secondary | ICD-10-CM | POA: Insufficient documentation

## 2018-10-07 DIAGNOSIS — M1990 Unspecified osteoarthritis, unspecified site: Secondary | ICD-10-CM | POA: Diagnosis not present

## 2018-10-07 DIAGNOSIS — Z882 Allergy status to sulfonamides status: Secondary | ICD-10-CM | POA: Insufficient documentation

## 2018-10-07 DIAGNOSIS — Z951 Presence of aortocoronary bypass graft: Secondary | ICD-10-CM | POA: Insufficient documentation

## 2018-10-07 DIAGNOSIS — Z885 Allergy status to narcotic agent status: Secondary | ICD-10-CM | POA: Diagnosis not present

## 2018-10-07 DIAGNOSIS — Z7722 Contact with and (suspected) exposure to environmental tobacco smoke (acute) (chronic): Secondary | ICD-10-CM | POA: Insufficient documentation

## 2018-10-07 DIAGNOSIS — I110 Hypertensive heart disease with heart failure: Secondary | ICD-10-CM | POA: Diagnosis not present

## 2018-10-07 DIAGNOSIS — Z886 Allergy status to analgesic agent status: Secondary | ICD-10-CM | POA: Diagnosis not present

## 2018-10-07 DIAGNOSIS — I5031 Acute diastolic (congestive) heart failure: Secondary | ICD-10-CM | POA: Diagnosis not present

## 2018-10-07 DIAGNOSIS — Z933 Colostomy status: Secondary | ICD-10-CM | POA: Insufficient documentation

## 2018-10-07 DIAGNOSIS — Z8249 Family history of ischemic heart disease and other diseases of the circulatory system: Secondary | ICD-10-CM | POA: Diagnosis not present

## 2018-10-07 DIAGNOSIS — R9431 Abnormal electrocardiogram [ECG] [EKG]: Secondary | ICD-10-CM | POA: Insufficient documentation

## 2018-10-07 DIAGNOSIS — F329 Major depressive disorder, single episode, unspecified: Secondary | ICD-10-CM | POA: Insufficient documentation

## 2018-10-07 LAB — BASIC METABOLIC PANEL
Anion gap: 7 (ref 5–15)
BUN: 17 mg/dL (ref 8–23)
CO2: 29 mmol/L (ref 22–32)
Calcium: 9.5 mg/dL (ref 8.9–10.3)
Chloride: 103 mmol/L (ref 98–111)
Creatinine, Ser: 0.93 mg/dL (ref 0.44–1.00)
GFR calc Af Amer: >60 mL/min (ref >60)
GFR calc non Af Amer: 58 mL/min — ABNORMAL LOW (ref >60)
Glucose, Bld: 143 mg/dL — ABNORMAL HIGH (ref 70–99)
Potassium: 3.8 mmol/L (ref 3.5–5.1)
Sodium: 139 mmol/L (ref 135–145)

## 2018-10-07 MED ORDER — POTASSIUM CHLORIDE ER 10 MEQ PO TBCR: EXTENDED_RELEASE_TABLET | ORAL | 11 refills | Status: DC

## 2018-10-07 NOTE — Progress Notes (Signed)
Primary Care Physician: Crist Infante, MD Referring Physician: Dr. Jannet Askew is a 78 y.o. female with a h/o coronary artery disease with prior bypass grafting, atrial fibrillation resulting in acute diastolic heart failure, hypertension, chronic anticoagulation therapy with Eliquisand hyperlipidemia.Was on amiodarone which was subsequently discontinued  In 2017,2/2 to intolerance, but pt can not remember in which  way she did not tolerate.  She was found to be in  afib 3/6 when seen by [name] and subsequently had successful cardioversion 3/7. She was seen in the afib clinic, 3/11, at Dr. Thompson Caul suggestion to get pt on sotalol, as she is very symptomatic in afib.[name]  She was in SR and feels improved. She is also on eliquis 5 mg bid and has not had any missed doses. She had a long qt around 500 ms and discussed with Dr. Rayann Heman who suggested that pt contact PCP to see if Prozac dose  could be cut in half, to minimize qt to get sotalol on board. Pt did this and qt did shorten.  Pt is in clinic, 07/03/17, and is ready to come into hospital for sotalol load. She started feeling lightheaded yesterday and is found to be in  afib with v rate at 117 bpm. No missed doses of eliquis 5 mg bid. No benadryl use.  F/u sotalol load 07/13/17. Pt is staying in rhythm but states " I can not live like this" and wants to stop drug. She has felt draggy, lightheaded, no energy since she started drug. We discussed cutting the dose in half instead of stopping drug to see if she can still maintain SR and she is willing to do this.  F/u 07/19/17. She called the office Monday and c/o of not being able to tolerate the sotalol, even at 40 mg bid, "still can't function , I have no energy." So she did not have sotalol on Tuesday,felt good, but started feeling poorly yesterday.Ekg shows afib at 106 bpm. She has checked on the price of tikosyn and with mail order can afford drug and is wanting to come into hospital next  week for Tikosyn load.   F/u in afib clinic, pt is here for tikosyn load. She continues in afib,rate controlled. She does not feel well in afib, "drunk" no energy.No missed doses of eliquis.  F/u Tikosyn load, 5/2. She  Had qtc prolongation with  Higher doses of Tikosyn and dose had to be lowered to 125 mcg   a day. She follows up today, in SR, but has the same complaints that she did on sotalol. Some days she feel ok but mostly feels draggy, low energy, woozy, lightheaded, short of breath. I explained to her that usually getting people back in SR improves their energy and they feel much better, shortness of breath improves.Phyllis Ginger does not make people feel bad. Sotalol can cause fatigue in some people. Her Prozac dose was decreased to get antiarrythmic's on board and I question if this had an effect. She is overdue to see her endocrinologist, the end of the month, her PCP for physical this next week, and [name] May 7th. Her BP med has been reduced once and possibly hypotension is contributing to symptoms. Standing BP today 110/60, not significantly lower at 114/58 sitting.  F/u in afib clinic 10/07/2018. She had a colostomy last December and went into afib sometime after her surgery on return home. She was aware that she was out of rhythm, she  just didn't want  to deal it then. She saw [name] 6/25 and found to be in persistent afib and had gained around 10 lbs of fluid, very much short of breath with exertion.[name] He scheduled her for a cardioversion and increased her lasix to daily from 20 mg bid.She felt improved for about 4-5 days after the cardioversion but then felt slightly winded, starting a few days ago. She feels much better in afib (rate controlled) than she did before the [facility] as she is back to her base weight (206-208 lbs at home). She has failed sotalol (extreme fatigue, now having breakthrough afib on tikosyn, and is very afraid to take amiodarone as her husband took it and she is afraid it  may have contributed to his death, although she says he died from lung ca. She is rate controlled today and weight is at baseline.She still feels better today in afib, than before cardioversion.  She has been waking up at night with her heart racing for a few minutes, has a sleep study pending. wears an oral appliance now for sleep apnea, although she has never been tested.   Today, she denies symptoms of  chest pain, shortness of breath, orthopnea, PND, lower extremity edema, dizziness, presyncope, syncope, or neurologic sequela.+ mild  shortness of breath.  Past Medical History:  Diagnosis Date   Addison's disease [facility])    Anxiety    Arthritis    "everywhere"   Atrial fibrillation ([place])    Eliquis   Chronic bronchitis ([place])    "although I didn't get it this year" (2/77/8242)   Complication of anesthesia    addison's disease causes hypotension after any surgery .  last knee surgery dr. Elmyra Ricks gave large dose of hydrocortisal and avoided the hypotensive side effectas of addison's disease.   Coronary artery disease    Depression    GERD (gastroesophageal reflux disease)    Hyperlipemia    Hypertension    Hypothyroidism    Lumbar radiculopathy 04/27/2014   Migraine    "in my 20's" (07/23/2017)   Recurrent upper respiratory infection (URI)    UTI (lower urinary tract infection) 07/09/2011   Past Surgical History:  Procedure Laterality Date   ACHILLES TENDON SURGERY Left 07/16/2014   bone spur removed   ANTERIOR LUMBAR FUSION  1980   L4-5   BACK SURGERY     CARDIAC CATHETERIZATION  2013   CARDIAC CATHETERIZATION N/A 09/02/2014   Procedure: Left Heart Cath and Coronary Angiography;  Surgeon: Wellington Hampshire, MD;  Location: [place] [facility];  Service: Cardiovascular;  Laterality: N/A;   CARDIOVERSION N/A 10/22/2015   Procedure: CARDIOVERSION;  Surgeon: Sanda Klein, MD;  Location: [place] [facility]OS[facility];  Service: Cardiovascular;  Laterality: N/A;   CARDIOVERSION N/A  06/07/2017   Procedure: CARDIOVERSION;  Surgeon: Larey Dresser, MD;  Location: [facility];  Service: Cardiovascular;  Laterality: N/A;   CARDIOVERSION N/A 07/05/2017   Procedure: CARDIOVERSION;  Surgeon: Pixie Casino, MD;  Location: [facility] ENDOSCOPY;  Service: Cardiovascular;  Laterality: N/A;   CARDIOVERSION N/A 09/30/2018   Procedure: CARDIOVERSION;  Surgeon: Buford Dresser, MD;  Location: [place];  Service: Cardiovascular;  Laterality: N/A;   CARPAL TUNNEL RELEASE Right    CATARACT EXTRACTION, BILATERAL Bilateral    COLON RESECTION N/A 03/20/2018   Procedure: LAPAROSCOPIC SIGMOIDECTOMY WITH COLOSTOMY;  Surgeon: Ileana Roup, MD;  Location: [facility];  Service: General;  Laterality: N/A;   COLON SURGERY     laparascopic vs. open sigmoidectomy with cystoscopy  Dr. Dema Severin and  Dr. Gloriann Loan 03-20-18   COLONOSCOPY WITH PROPOFOL N/A 01/17/2018   Procedure: COLONOSCOPY WITH PROPOFOL;  Surgeon: Milus Banister, MD;  Location: [facility];  Service: Endoscopy;  Laterality: N/A;   CORONARY ARTERY BYPASS GRAFT  06/26/2011   Procedure: CORONARY ARTERY BYPASS GRAFTING (CABG);  Surgeon: Melrose Nakayama, MD;  Location: [place];  Service: Open Heart Surgery;  Laterality: N/A;  times using Greater Saphenous Vein Graft harvested endoscopically from right leg   CYSTOSCOPY W/ URETERAL STENT PLACEMENT Bilateral 03/20/2018   Procedure: CYSTOSCOPY WITH BILATERAL RETROGRADE AND BILATERAL URETERAL CATHETER PLACEMENT;  Surgeon: Lucas Mallow, MD;  Location: [facility];  Service: Urology;  Laterality: Bilateral;   DIAGNOSTIC LAPAROSCOPY     DILATION AND CURETTAGE OF UTERUS     JOINT REPLACEMENT     KNEE ARTHROSCOPY Bilateral    LAPAROSCOPIC CHOLECYSTECTOMY     POLYPECTOMY  01/17/2018   Procedure: POLYPECTOMY;  Surgeon: Milus Banister, MD;  Location: [facility];  Service: Endoscopy;;   REVISION TOTAL KNEE ARTHROPLASTY Right    SPINAL CORD STIMULATOR IMPLANT  12/10/15   TAKE  DOWN OF INTESTINAL FISTULA N/A 03/20/2018   Procedure: LAPAROSCOPIC TAKE DOWN OF COLOVESICAL FISTULA;  Surgeon: Ileana Roup, MD;  Location: [facility];  Service: General;  Laterality: N/A;   TONSILLECTOMY     TOTAL KNEE ARTHROPLASTY Bilateral     Current Outpatient Medications  Medication Sig Dispense Refill   acetaminophen (TYLENOL) 500 MG tablet Take 500 mg by mouth every 6 (six) hours as needed (headache/pain.).      apixaban (ELIQUIS) 5 MG TABS tablet Take 5 mg by mouth 2 (two) times daily.     aspirin EC 81 MG tablet Take 81 mg by mouth every Monday, Wednesday, and Friday. In the morning.     clonazePAM (KLONOPIN) 0.5 MG tablet Take 0.25-0.5 mg by mouth 2 (two) times daily as needed for anxiety.     Cyanocobalamin 1000 MCG/ML KIT Inject 1 mL into the muscle every 30 (thirty) days. Take one (1) injection as directed every month.     diltiazem (CARDIZEM CD) 180 MG 24 hr capsule TAKE ONE CAPSULE EACH DAY (Patient taking differently: Take 180 mg by mouth every evening. ) 90 capsule 1   dofetilide (TIKOSYN) 125 MCG capsule TAKE ONE CAPSULE TWICE A DAY 180 capsule 3   Evolocumab (REPATHA SURECLICK) 626 MG/ML SOAJ Inject 1 pen into the skin every 14 (fourteen) days. (Patient taking differently: Inject 140 mg into the skin every 14 (fourteen) days. ) 2 pen 11   FLUoxetine (PROZAC) 10 MG tablet Take 10 mg by mouth daily.      furosemide (LASIX) 20 MG tablet TAKE ONE TABLET BY MOUTH ONCE DAILY (Patient taking differently: Take 20 mg by mouth daily. ) 90 tablet 2   gabapentin (NEURONTIN) 100 MG capsule 1 capsule in the morning and midday, 3 in the evening 150 capsule 3   hydrocortisone (CORTEF) 20 MG tablet Take 20 mg by mouth 2 (two) times daily.     levothyroxine (SYNTHROID, LEVOTHROID) 88 MCG tablet Take 1 tablet (88 mcg total) by mouth daily before breakfast. 30 tablet 0   Magnesium 500 MG TABS Take 1,000 mg by mouth at bedtime.     mirabegron ER (MYRBETRIQ) 50 MG TB24  tablet Take 50 mg by mouth daily.     omeprazole (PRILOSEC) 20 MG capsule Take 20 mg by mouth daily.      polyethylene glycol-electrolytes (NULYTELY/GOLYTELY) 420 g solution Take 4,000 mLs  by mouth as directed. 4000 mL 0   potassium chloride (K-DUR) 10 MEQ tablet TAKE TWO TABLETS EVERY DAY (Patient taking differently: Take 20 mEq by mouth daily. ) 60 tablet 11   rOPINIRole (REQUIP) 0.25 MG tablet Take 0.5 mg by mouth at bedtime.     valsartan (DIOVAN) 80 MG tablet Take 80 mg by mouth every evening.      Vitamin D, Ergocalciferol, (DRISDOL) 50000 UNITS CAPS Take 50,000 Units by mouth 2 (two) times a week. Takes on Tuesdays and Thursdays     NITROSTAT 0.4 MG SL tablet ONE TABLET UNDER TONGUE AS NEEDED FOR CHEST PAIN AS DIRECTED (Patient not taking: No sig reported) 25 tablet 6   No current facility-administered medications for this encounter.     Allergies  Allergen Reactions   Percocet [Oxycodone-Acetaminophen] Other (See Comments)    Hallucination, pt is not allergic to tylenol(acetaminophen)   Tramadol Other (See Comments)    Hallucinations.   Sulfa Antibiotics Itching    Vaginal itching   Sulfacetamide Sodium Itching    Vaginal itching    Social History   Socioeconomic History   Marital status: Widowed    Spouse name: Not on file   Number of children: Not on file   Years of education: Not on file   Highest education level: Not on file  Occupational History   Occupation: Retired  Scientist, product/process development strain: Not on file   Food insecurity    Worry: Not on file    Inability: Not on Lexicographer needs    Medical: Not on file    Non-medical: Not on file  Tobacco Use   Smoking status: Passive Smoke Exposure - Never Smoker   Smokeless tobacco: Never Used   Tobacco comment: "husband passed in 2010; father passed 1978"  Substance and Sexual Activity   Alcohol use: Yes    Alcohol/week: 0.0 standard drinks    Comment: glass of wine  occasionally    Drug use: No   Sexual activity: Not Currently    Birth control/protection: Post-menopausal  Lifestyle   Physical activity    Days per week: Not on file    Minutes per session: Not on file   Stress: Not on file  Relationships   Social connections    Talks on phone: Not on file    Gets together: Not on file    Attends religious service: Not on file    Active member of club or organization: Not on file    Attends meetings of clubs or organizations: Not on file    Relationship status: Not on file   Intimate partner violence    Fear of current or ex partner: Not on file    Emotionally abused: Not on file    Physically abused: Not on file    Forced sexual activity: Not on file  Other Topics Concern   Not on file  Social History Narrative   Not on file    Family History  Problem Relation Age of Onset   Heart attack Father    Hypertension Mother    Stroke Mother    Hypertension Maternal Grandfather    Colon cancer Neg Hx     ROS- All systems are reviewed and negative except as per the HPI above  Physical Exam: Vitals:   10/07/18 1103  BP: 94/62  Pulse: 87  Weight: 95.3 kg  Height: 5' 4.5" (1.638 m)   Wt Readings from Last 3 Encounters:  10/07/18 95.3 kg  09/30/18 96.7 kg  09/26/18 96.7 kg    Labs: Lab Results  Component Value Date   NA 139 10/07/2018   K 3.8 10/07/2018   CL 103 10/07/2018   CO2 29 10/07/2018   GLUCOSE 143 (H) 10/07/2018   BUN 17 10/07/2018   CREATININE 0.93 10/07/2018   CALCIUM 9.5 10/07/2018   PHOS 4.0 03/25/2018   MG 2.1 09/26/2018   Lab Results  Component Value Date   INR 1.27 03/15/2018   Lab Results  Component Value Date   CHOL 115 07/16/2018   HDL 53 07/16/2018   LDLCALC 45 07/16/2018   TRIG 87 07/16/2018     GEN- The patient is well appearing, alert and oriented x 3 today.   Head- normocephalic, atraumatic Eyes-  Sclera clear, conjunctiva pink Ears- hearing intact Oropharynx- clear Neck-  supple, no JVP Lymph- no cervical lymphadenopathy Lungs- Clear to ausculation bilaterally, normal work of breathing Heart-irregular rate and rhythm, no murmurs, rubs or gallops, PMI not laterally displaced GI- soft, NT, ND, + BS Extremities- no clubbing, cyanosis, or edema MS- no significant deformity or atrophy Skin- no rash or lesion Psych- euthymic mood, full affect Neuro- strength and sensation are intact  EKG- afib at 87 bpm, qrs int 104 ms, qtc 445 ms    Assessment and Plan: 1. Paroxysmal afib Present since colostomy surgery first of the year Successful cardioversion 6/29 but ERAF Maintained SR on sotalol but was very symptomatic with fatigue, presyncope, d/ced and loaded on tikosyn Now breaking through Tikosyn, she will not be able to change dose of tikosyn, as [name] had questioned, as she had significant qt prolongation on higher doses She is afraid to use amiodarone as her husband was on it and eventually died form lung ca Maybe an ablation candidate Possibly related to sleep apnea, sleep study pending She is rate controlled and will not change any meds today  Continue eliquis 5 mg bid  Will leave diltiazem at 180 mg   Will send back to Dr. Lovena Le to further discuss options Will get bmet today as now on lasix 20 mg daily   [name] C. Baileigh Modisette, [facility] [address] [place], [place] [address] [phone]

## 2018-10-07 NOTE — Addendum Note (Signed)
Encounter addended by: Sherran Needs, NP on: 10/07/2018 1:38 PM  Actions taken: Allergies reviewed, Medication List reviewed, Problem List reviewed

## 2018-10-08 ENCOUNTER — Telehealth: Payer: Self-pay | Admitting: *Deleted

## 2018-10-08 NOTE — Telephone Encounter (Signed)
Staff message sent to [name] ok to schedule sleep study. Per [employer] portal no PA required. Decision ID: [id number].

## 2018-10-08 NOTE — Telephone Encounter (Signed)
-----   Message from Juluis Mire, RN sent at 10/07/2018 11:36 AM EDT ----- Regarding: sleep study Pt was ordered sleep study back in April by jill mcdaniel - pt needs to be scheduled. Marzetta Board

## 2018-10-09 ENCOUNTER — Telehealth: Payer: Self-pay | Admitting: Physical Medicine and Rehabilitation

## 2018-10-09 ENCOUNTER — Other Ambulatory Visit: Payer: Self-pay

## 2018-10-09 ENCOUNTER — Encounter: Payer: Self-pay | Admitting: Physical Medicine and Rehabilitation

## 2018-10-09 ENCOUNTER — Telehealth: Payer: Self-pay | Admitting: Internal Medicine

## 2018-10-09 ENCOUNTER — Ambulatory Visit: Payer: Self-pay

## 2018-10-09 ENCOUNTER — Ambulatory Visit (INDEPENDENT_AMBULATORY_CARE_PROVIDER_SITE_OTHER): Payer: Medicare Other | Admitting: Physical Medicine and Rehabilitation

## 2018-10-09 VITALS — BP 134/87 | HR 85 | Ht 64.5 in | Wt 207.0 lb

## 2018-10-09 DIAGNOSIS — M961 Postlaminectomy syndrome, not elsewhere classified: Secondary | ICD-10-CM | POA: Diagnosis not present

## 2018-10-09 DIAGNOSIS — M545 Low back pain, unspecified: Secondary | ICD-10-CM

## 2018-10-09 DIAGNOSIS — M7918 Myalgia, other site: Secondary | ICD-10-CM

## 2018-10-09 DIAGNOSIS — Z9889 Other specified postprocedural states: Secondary | ICD-10-CM

## 2018-10-09 DIAGNOSIS — M47816 Spondylosis without myelopathy or radiculopathy, lumbar region: Secondary | ICD-10-CM

## 2018-10-09 DIAGNOSIS — Z9689 Presence of other specified functional implants: Secondary | ICD-10-CM

## 2018-10-09 DIAGNOSIS — M546 Pain in thoracic spine: Secondary | ICD-10-CM | POA: Diagnosis not present

## 2018-10-09 DIAGNOSIS — G8929 Other chronic pain: Secondary | ICD-10-CM

## 2018-10-09 MED ORDER — DIAZEPAM 5 MG PO TABS: ORAL_TABLET | ORAL | 0 refills | Status: DC

## 2018-10-09 NOTE — Telephone Encounter (Signed)
Notification or Prior Authorization is not required for the requested services  This [employer] members plan does not currently require a prior authorization for 3-50  Decision ID #:[id number]

## 2018-10-09 NOTE — Telephone Encounter (Signed)
Returned call to Pt.  Scheduled Pt with Dr. Lovena Le for next Thursday.

## 2018-10-09 NOTE — Telephone Encounter (Signed)
New message    Patient c/o Palpitations:  High priority if patient c/o lightheadedness, shortness of breath, or chest pain  1) How long have you had palpitations/irregular HR/ Afib? Are you having the symptoms now?Patient has been in afib since January, yes   2) Are you currently experiencing lightheadedness, SOB or CP?sob   3) Do you have a history of afib (atrial fibrillation) or irregular heart rhythm?yes   4) Have you checked your BP or HR? (document readings if available): hr 90 b/p 110/60   5) Are you experiencing any other symptoms? No

## 2018-10-09 NOTE — Progress Notes (Signed)
.  Numeric Pain Rating Scale and Functional Assessment Average Pain 10 Pain Right Now 2 My pain is constant and sharp Pain is worse with: walking and bending Pain improves with: rest   In the last MONTH (on 0-10 scale) has pain interfered with the following?  1. General activity like being  able to carry out your everyday physical activities such as walking, climbing stairs, carrying groceries, or moving a chair?  Rating(10)  2. Relation with others like being able to carry out your usual social activities and roles such as  activities at home, at work and in your community. Rating(10)  3. Enjoyment of life such that you have  been bothered by emotional problems such as feeling anxious, depressed or irritable?  Rating(8)

## 2018-10-15 ENCOUNTER — Telehealth: Payer: Self-pay | Admitting: Internal Medicine

## 2018-10-15 NOTE — Telephone Encounter (Signed)
Pt reports that her Sob is not new for her and has not been around anyone sick and will wear her mask to her 10/17/18 OV. She will call if anything changes prior to her appt. No other sx such as cough, fever, body aches.

## 2018-10-15 NOTE — Telephone Encounter (Signed)
New Message        COVID-19 Pre-Screening Questions:   In the past 7 to 10 days have you had a cough,  shortness of breath, headache, congestion, fever (100 or greater) body aches, chills, sore throat, or sudden loss of taste or sense of smell? SOB she says from her heart not COVID   Have you been around anyone with known Covid 19. NO  Have you been around anyone who is awaiting Covid 19 test results in the past 7 to 10 days? NO  Have you been around anyone who has been exposed to Covid 19, or has mentioned symptoms of Covid 19 within the past 7 to 10 days? NO  If you have any concerns/questions about symptoms patients report during screening (either on the phone or at threshold). Contact the provider seeing the patient or DOD for further guidance.  If neither are available contact a member of the leadership team.

## 2018-10-16 ENCOUNTER — Ambulatory Visit: Payer: Medicare Other

## 2018-10-16 ENCOUNTER — Other Ambulatory Visit: Payer: Self-pay

## 2018-10-16 ENCOUNTER — Ambulatory Visit (INDEPENDENT_AMBULATORY_CARE_PROVIDER_SITE_OTHER): Payer: Medicare Other | Admitting: Physical Medicine and Rehabilitation

## 2018-10-16 ENCOUNTER — Encounter: Payer: Self-pay | Admitting: Physical Medicine and Rehabilitation

## 2018-10-16 VITALS — BP 131/70 | HR 69 | Temp 97.7°F

## 2018-10-16 DIAGNOSIS — G8929 Other chronic pain: Secondary | ICD-10-CM

## 2018-10-16 DIAGNOSIS — M47816 Spondylosis without myelopathy or radiculopathy, lumbar region: Secondary | ICD-10-CM

## 2018-10-16 DIAGNOSIS — M545 Low back pain, unspecified: Secondary | ICD-10-CM

## 2018-10-16 MED ORDER — BETAMETHASONE SOD PHOS & ACET 6 (3-3) MG/ML IJ SUSP
12.0000 mg | Freq: Once | INTRAMUSCULAR | Status: AC
  Administered 2018-10-16: 12 mg

## 2018-10-16 NOTE — Progress Notes (Signed)
Numeric Pain Rating Scale and Functional Assessment Average Pain 10   In the last MONTH (on 0-10 scale) has pain interfered with the following?  1. General activity like being  able to carry out your everyday physical activities such as walking, climbing stairs, carrying groceries, or moving a chair?  Rating(10)   +Driver, -BT, -Dye Allergies. 

## 2018-10-17 ENCOUNTER — Telehealth: Payer: Self-pay | Admitting: Internal Medicine

## 2018-10-17 ENCOUNTER — Ambulatory Visit (INDEPENDENT_AMBULATORY_CARE_PROVIDER_SITE_OTHER): Payer: Medicare Other | Admitting: Internal Medicine

## 2018-10-17 ENCOUNTER — Encounter: Payer: Self-pay | Admitting: Internal Medicine

## 2018-10-17 VITALS — BP 136/74 | HR 94 | Ht 64.5 in | Wt 212.4 lb

## 2018-10-17 DIAGNOSIS — I25810 Atherosclerosis of coronary artery bypass graft(s) without angina pectoris: Secondary | ICD-10-CM | POA: Diagnosis not present

## 2018-10-17 DIAGNOSIS — I480 Paroxysmal atrial fibrillation: Secondary | ICD-10-CM

## 2018-10-17 DIAGNOSIS — I10 Essential (primary) hypertension: Secondary | ICD-10-CM

## 2018-10-17 DIAGNOSIS — I2510 Atherosclerotic heart disease of native coronary artery without angina pectoris: Secondary | ICD-10-CM | POA: Insufficient documentation

## 2018-10-17 MED ORDER — AMIODARONE HCL 200 MG PO TABS: 200.0000 mg | ORAL_TABLET | Freq: Two times a day (BID) | ORAL | 3 refills | Status: DC

## 2018-10-17 MED ORDER — POTASSIUM CHLORIDE ER 10 MEQ PO TBCR: 20.0000 meq | EXTENDED_RELEASE_TABLET | Freq: Two times a day (BID) | ORAL | 3 refills | Status: DC

## 2018-10-17 MED ORDER — FUROSEMIDE 40 MG PO TABS: 40.0000 mg | ORAL_TABLET | Freq: Every day | ORAL | 3 refills | Status: DC

## 2018-10-17 NOTE — Progress Notes (Signed)
PCP:  Crist Infante, MD Primary Cardiologist: Sinclair Grooms, MD Electrophysiologist: None   [name] is a 78 y.o. female who presents today for routine electrophysiology followup. They are seen with Dr Lovena Le.   Since last being seen in our clinic, she has had issues with her atrial fibrillation. She had a DCCV on 09/30/2018 that lasted approx 4 days before she went back into Afib. She is very symptomatic with NYHA III-IIIb symptoms. Her breathing typically recovers after she rests for several minutes. She has not tried taking extra lasix. She failed sotalol in 07/2017 and has been on tikosyn, but did not tolerate higher doses. She has not been on amiodarone due to concerns over its side effects. She is willing to try now. She snores heavily, and is scheduled for a sleep study.   Past Medical History:  Diagnosis Date  . Addison's disease ([place])   . Anxiety   . Arthritis    "everywhere"  . Atrial fibrillation (HCC)    Eliquis  . Chronic bronchitis ([place])    "although I didn't get it this year" (07/23/2017)  . 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  last knee surgery dr. Elmyra Ricks gave large dose of hydrocortisal and avoided the hypotensive side effectas of addison's disease.  . Coronary artery disease   . Depression   . GERD (gastroesophageal reflux disease)   . Hyperlipemia   . Hypertension   . Hypothyroidism   . Lumbar radiculopathy 04/27/2014  . Migraine    "in my 20's" (07/23/2017)  . Recurrent upper respiratory infection (URI)   . UTI (lower urinary tract infection) 07/09/2011   Past Surgical History:  Procedure Laterality Date  . ACHILLES TENDON SURGERY Left 07/16/2014   bone spur removed  . ANTERIOR LUMBAR FUSION  1980   L4-5  . BACK SURGERY    . CARDIAC CATHETERIZATION  2013  . CARDIAC CATHETERIZATION N/A 09/02/2014   Procedure: Left Heart Cath and Coronary Angiography;  Surgeon: Wellington Hampshire, MD;  Location: [place] [facility];  Service: Cardiovascular;  Laterality: N/A;  . CARDIOVERSION N/A 10/22/2015   Procedure: CARDIOVERSION;  Surgeon: Sanda Klein, MD;  Location: [facility];  Service: Cardiovascular;  Laterality: N/A;  . CARDIOVERSION N/A 06/07/2017   Procedure: CARDIOVERSION;  Surgeon: Larey Dresser, MD;  Location: [facility] ENDOSCOPY;  Service: Cardiovascular;  Laterality: N/A;  . CARDIOVERSION N/A 07/05/2017   Procedure: CARDIOVERSION;  Surgeon: Pixie Casino, MD;  Location: [facility] ENDOSCOPY;  Service: Cardiovascular;  Laterality: N/A;  . CARDIOVERSION N/A 09/30/2018   Procedure: CARDIOVERSION;  Surgeon: Buford Dresser, MD;  Location: [place];  Service: Cardiovascular;  Laterality: N/A;  . CARPAL TUNNEL RELEASE Right   . CATARACT EXTRACTION, BILATERAL Bilateral   . COLON RESECTION N/A 03/20/2018   Procedure: LAPAROSCOPIC SIGMOIDECTOMY WITH COLOSTOMY;  Surgeon: Ileana Roup, MD;  Location: [facility];  Service: General;  Laterality: N/A;  . COLON SURGERY     laparascopic vs. open sigmoidectomy with cystoscopy  Dr. Dema Severin and Dr. Gloriann Loan 03-20-18  . COLONOSCOPY WITH PROPOFOL N/A 01/17/2018   Procedure: COLONOSCOPY WITH PROPOFOL;  Surgeon: Milus Banister, MD;  Location: [facility] ENDOSCOPY;  Service: Endoscopy;  Laterality: N/A;  . CORONARY ARTERY BYPASS GRAFT  06/26/2011   Procedure: CORONARY ARTERY BYPASS GRAFTING (CABG);  Surgeon: Melrose Nakayama, MD;  Location: [place];  Service: Open Heart Surgery;  Laterality: N/A;  times using Greater Saphenous Vein Graft harvested endoscopically from  right leg  . CYSTOSCOPY W/ URETERAL STENT PLACEMENT Bilateral 03/20/2018   Procedure: CYSTOSCOPY WITH BILATERAL RETROGRADE AND BILATERAL URETERAL CATHETER PLACEMENT;  Surgeon: Lucas Mallow, MD;  Location: [facility];  Service: Urology;  Laterality: Bilateral;  . DIAGNOSTIC LAPAROSCOPY    . DILATION AND CURETTAGE OF UTERUS    . JOINT REPLACEMENT    . KNEE ARTHROSCOPY Bilateral   . LAPAROSCOPIC  CHOLECYSTECTOMY    . POLYPECTOMY  01/17/2018   Procedure: POLYPECTOMY;  Surgeon: Milus Banister, MD;  Location: [facility];  Service: Endoscopy;;  . REVISION TOTAL KNEE ARTHROPLASTY Right   . SPINAL CORD STIMULATOR IMPLANT  12/10/15  . TAKE DOWN OF INTESTINAL FISTULA N/A 03/20/2018   Procedure: LAPAROSCOPIC TAKE DOWN OF COLOVESICAL FISTULA;  Surgeon: Ileana Roup, MD;  Location: [facility];  Service: General;  Laterality: N/A;  . TONSILLECTOMY    . TOTAL KNEE ARTHROPLASTY Bilateral     Current Outpatient Medications  Medication Sig Dispense Refill  . acetaminophen (TYLENOL) 500 MG tablet Take 500 mg by mouth every 6 (six) hours as needed (headache/pain.).     [name] apixaban (ELIQUIS) 5 MG TABS tablet Take 5 mg by mouth 2 (two) times daily.    [name] aspirin EC 81 MG tablet Take 81 mg by mouth every Monday, Wednesday, and Friday. In the morning.    . clonazePAM (KLONOPIN) 0.5 MG tablet Take 0.25-0.5 mg by mouth 2 (two) times daily as needed for anxiety.    . Cyanocobalamin 1000 MCG/ML KIT Inject 1 mL into the muscle every 30 (thirty) days. Take one (1) injection as directed every month.    . diazepam (VALIUM) 5 MG tablet Take 1 by mouth 1 hour  pre-procedure with very light food. May bring 2nd tablet to appointment. 2 tablet 0  . diltiazem (CARDIZEM CD) 180 MG 24 hr capsule Take 180 mg by mouth every evening.    . dofetilide (TIKOSYN) 125 MCG capsule TAKE ONE CAPSULE TWICE A DAY 180 capsule 3  . Evolocumab 140 MG/ML SOAJ Inject 140 mg into the skin every 14 (fourteen) days.    [name] FLUoxetine (PROZAC) 10 MG tablet Take 10 mg by mouth daily.     . furosemide (LASIX) 20 MG tablet Take 20 mg by mouth daily.    [name] gabapentin (NEURONTIN) 100 MG capsule 1 capsule in the morning and midday, 3 in the evening 150 capsule 3  . hydrocortisone (CORTEF) 20 MG tablet Take 20 mg by mouth 2 (two) times daily.    [name] levothyroxine (SYNTHROID, LEVOTHROID) 88 MCG tablet Take 1 tablet (88 mcg total) by mouth daily  before breakfast. 30 tablet 0  . Magnesium 500 MG TABS Take 1,000 mg by mouth at bedtime.    . mirabegron ER (MYRBETRIQ) 50 MG TB24 tablet Take 50 mg by mouth daily.    [name] NITROSTAT 0.4 MG SL tablet ONE TABLET UNDER TONGUE AS NEEDED FOR CHEST PAIN AS DIRECTED 25 tablet 6  . omeprazole (PRILOSEC) 20 MG capsule Take 20 mg by mouth daily.     . polyethylene glycol-electrolytes (NULYTELY/GOLYTELY) 420 g solution Take 4,000 mLs by mouth as directed. 4000 mL 0  . potassium chloride (K-DUR) 10 MEQ tablet Take 2 tablets in the AM and 1 tablet in the PM 60 tablet 11  . rOPINIRole (REQUIP) 0.25 MG tablet Take 0.5 mg by mouth at bedtime.    . valsartan (DIOVAN) 80 MG tablet Take 80 mg by mouth every evening.     . Vitamin  D, Ergocalciferol, (DRISDOL) 50000 UNITS CAPS Take 50,000 Units by mouth 2 (two) times a week. Takes on Tuesdays and Thursdays     No current facility-administered medications for this visit.     Allergies  Allergen Reactions  . Percocet [Oxycodone-Acetaminophen] Other (See Comments)    Hallucination, pt is not allergic to tylenol(acetaminophen)  . Tramadol Other (See Comments)    Hallucinations.  . Sulfa Antibiotics Itching    Vaginal itching  . Sulfacetamide Sodium Itching    Vaginal itching    Social History   Socioeconomic History  . Marital status: Widowed    Spouse name: Not on file  . Number of children: Not on file  . Years of education: Not on file  . Highest education level: Not on file  Occupational History  . Occupation: Retired  Scientific laboratory technician  . Financial resource strain: Not on file  . Food insecurity    Worry: Not on file    Inability: Not on file  . Transportation needs    Medical: Not on file    Non-medical: Not on file  Tobacco Use  . Smoking status: Passive Smoke Exposure - Never Smoker  . Smokeless tobacco: Never Used  . Tobacco comment: "husband passed in 2010; father passed 1978"  Substance and Sexual Activity  . Alcohol use: Yes     Alcohol/week: 0.0 standard drinks    Comment: glass of wine occasionally   . Drug use: No  . Sexual activity: Not Currently    Birth control/protection: Post-menopausal  Lifestyle  . Physical activity    Days per week: Not on file    Minutes per session: Not on file  . Stress: Not on file  Relationships  . Social Herbalist on phone: Not on file    Gets together: Not on file    Attends religious service: Not on file    Active member of club or organization: Not on file    Attends meetings of clubs or organizations: Not on file    Relationship status: Not on file  . Intimate partner violence    Fear of current or ex partner: Not on file    Emotionally abused: Not on file    Physically abused: Not on file    Forced sexual activity: Not on file  Other Topics Concern  . Not on file  Social History Narrative  . Not on file     Review of Systems: General: No chills, fever, night sweats or weight changes  Cardiovascular:  No chest pain, dyspnea on exertion, edema, orthopnea, palpitations, paroxysmal nocturnal dyspnea Dermatological: No rash, lesions or masses Respiratory: No cough, dyspnea Urologic: No hematuria, dysuria Abdominal: No nausea, vomiting, diarrhea, bright red blood per rectum, melena, or hematemesis Neurologic: No visual changes, weakness, changes in mental status All other systems reviewed and are otherwise negative except as noted above.  Physical Exam: Vitals:   10/17/18 0922  BP: 136/74  Pulse: 94  SpO2: 97%  Weight: 212 lb 6.4 oz (96.3 kg)  Height: 5' 4.5" (1.638 m)    GEN- The patient is well appearing, alert and oriented x 3 today.   HEENT: normocephalic, atraumatic; sclera clear, conjunctiva pink; hearing intact; oropharynx clear; neck supple, no JVP Lymph- no cervical lymphadenopathy Lungs- Clear to ausculation bilaterally, normal work of breathing.  No wheezes, rales, rhonchi Heart- Regular rate and rhythm, no murmurs, rubs or gallops,  PMI not laterally displaced GI- soft, non-tender, non-distended, bowel sounds present, no hepatosplenomegaly Extremities-  no clubbing, cyanosis, or edema; DP/PT/radial pulses 2+ bilaterally MS- no significant deformity or atrophy Skin- warm and dry, no rash or lesion Psych- euthymic mood, full affect Neuro- strength and sensation are intact  Assessment and Plan:  1. Persistent Atrial fibrilliation s/p failed DCCV 6/29 on tikosyn: She is very symptomatic and now agreeable to amiodarone.  Will stop Tikosyn and start amiodarone 200 mg BID. No bleeding on Eliquis. Continue diltiazem 180 for rate control  2. Chronic diastolic CHF- Increase lasix to 40 mg daily at least while out of rhythm. Increase K-dur to 20 meq BID.   3. CAD s/p CABG - No ischemic symptoms. Last EF 09/2015 65-70%  4. Suspected sleep apnea - snores heavily. Ordered for sleep study that was deferred due to [place].   Will change to amiodarone as above and increase lasix. RTC 4 weeks. If remains in Afib will set up for DCCV. Pt knows to call with worsening symptoms.   Shirley Friar, PA-C  10/17/18 9:26 AM   EP Attending  Patient seen and examined. I have reviewed the documentation by Mr. Tillery, PA-C above and concur with his findings which represent mine. I have personally reviewed the treatment options with Mrs. Quesada. She has evidence of volume overload in atrial fib and her dofetilide has become ineffective at maintaining her in NSR. Even though she does not appear to go particularly fast when she is in atrial fib she is very symptomatic. She will stop dofetilide and start amiodarone. We will have her return in a month. If she remains in atrial fib, she will be scheduled for DCCV. She will continue her Eliquis.  Mikle Bosworth.D.

## 2018-10-17 NOTE — Procedures (Signed)
Lumbar Facet Joint Intra-Articular Injection(s) with Fluoroscopic Guidance  Patient: [name]      Date of Birth: 1940/08/29 MRN: [id number] PCP: Crist Infante, MD      Visit Date: 10/16/2018   Universal Protocol:    Date/Time: 10/16/2018  Consent Given By: the patient  Position: PRONE   Additional Comments: Vital signs were monitored before and after the procedure. Patient was prepped and draped in the usual sterile fashion. The correct patient, procedure, and site was verified.   Injection Procedure Details:  Procedure Site One Meds Administered:  Meds ordered this encounter  Medications  . betamethasone acetate-betamethasone sodium phosphate (CELESTONE) injection 12 mg     Laterality: Bilateral  Location/Site:  L2-L3 L3-L4  Needle size: 22 guage  Needle type: Spinal  Needle Placement: Articular  Findings:  -Comments: Excellent flow of contrast producing a partial arthrogram.  Procedure Details: The fluoroscope beam is vertically oriented in AP, and the inferior recess is visualized beneath the lower pole of the inferior apophyseal process, which represents the target point for needle insertion. When direct visualization is difficult the target point is located at the medial projection of the vertebral pedicle. The region overlying each aforementioned target is locally anesthetized with a 1 to 2 ml. volume of 1% Lidocaine without Epinephrine.   The spinal needle was inserted into each of the above mentioned facet joints using biplanar fluoroscopic guidance. A 0.25 to 0.5 ml. volume of Isovue-250 was injected and a partial facet joint arthrogram was obtained. A single spot film was obtained of the resulting arthrogram.    One to 1.25 ml of the steroid/anesthetic solution was then injected into each of the facet joints noted above.   Additional Comments:  The patient tolerated the procedure well Dressing: 2 x 2 sterile gauze and Band-Aid    Post-procedure  details: Patient was observed during the procedure. Post-procedure instructions were reviewed.  Patient left the clinic in stable condition.

## 2018-10-17 NOTE — Patient Instructions (Addendum)
Medication Instructions:  Your physician has recommended you make the following change in your medication:   1.  Stop taking dofetilide  2.  Wait 24 hours between your last dose of dofetilide and starting amiodarone  3.  Start amiodarone 200 mg-  Take one tablet by mouth TWICE a day.  4.  Increase your lasix --Start taking lasix 40 mg one tablet by mouth daily.  5.  Increase your potassium-- Take potassium 10 meq--Take 2 tablets by mouth TWICE a day.   Labwork: None ordered.  Testing/Procedures: None ordered.  Follow-Up: Your physician wants you to follow-up in: 4 weeks with Oda Kilts, PA.  Any Other Special Instructions Will Be Listed Below (If Applicable).  If you need a refill on your cardiac medications before your next appointment, please call your pharmacy.

## 2018-10-17 NOTE — Progress Notes (Signed)
ALEECE LOYD - 78 y.o. female MRN [id number]  Date of birth: 1940/07/18  Office Visit Note: Visit Date: 10/16/2018 PCP: Crist Infante, MD Referred by: Crist Infante, MD  Subjective: Chief Complaint  Patient presents with  . Lower Back - Pain   HPI:  [name] is a 78 y.o. female who comes in today For planned bilateral facet/medial branch blocks at L2-3 and L3-4.  Please see our prior notes for further review and justification.  Reviews that the patient is having axial low back pain not relieved with medication management and activity modification and history of remote anterior lumbar fusion at L4-5 and 1980 and status post [employer] spinal cord stimulator.  Patient had been seeing Dr. Niel Hummer with decent relief with injection management.  Interestingly the patient's belief system has it that she obtained significant relief of her low back pain with the last injection that was performed which was bilateral thoracic facet joint block using betamethasone or Celestone.  We will go ahead and use betamethasone and Celestone today we are going to complete the lower injection at the upper lumbar region based on her symptoms and imaging.  Patient is very anxious today more than I remember when I saw her at the office visit.  ROS Otherwise per HPI.  Assessment & Plan: Visit Diagnoses:  1. Spondylosis without myelopathy or radiculopathy, lumbar region   2. Chronic bilateral low back pain without sciatica     Plan: No additional findings.   Meds & Orders:  Meds ordered this encounter  Medications  . betamethasone acetate-betamethasone sodium phosphate (CELESTONE) injection 12 mg    Orders Placed This Encounter  Procedures  . Facet Injection  . XR C-ARM NO REPORT    Follow-up: Return if symptoms worsen or fail to improve.   Procedures: No procedures performed  Lumbar Facet Joint Intra-Articular Injection(s) with Fluoroscopic Guidance  Patient: [name]      Date  of Birth: Jun 26, 1940 MRN: [id number] PCP: Crist Infante, MD      Visit Date: 10/16/2018   Universal Protocol:    Date/Time: 10/16/2018  Consent Given By: the patient  Position: PRONE   Additional Comments: Vital signs were monitored before and after the procedure. Patient was prepped and draped in the usual sterile fashion. The correct patient, procedure, and site was verified.   Injection Procedure Details:  Procedure Site One Meds Administered:  Meds ordered this encounter  Medications  . betamethasone acetate-betamethasone sodium phosphate (CELESTONE) injection 12 mg     Laterality: Bilateral  Location/Site:  L2-L3 L3-L4  Needle size: 22 guage  Needle type: Spinal  Needle Placement: Articular  Findings:  -Comments: Excellent flow of contrast producing a partial arthrogram.  Procedure Details: The fluoroscope beam is vertically oriented in AP, and the inferior recess is visualized beneath the lower pole of the inferior apophyseal process, which represents the target point for needle insertion. When direct visualization is difficult the target point is located at the medial projection of the vertebral pedicle. The region overlying each aforementioned target is locally anesthetized with a 1 to 2 ml. volume of 1% Lidocaine without Epinephrine.   The spinal needle was inserted into each of the above mentioned facet joints using biplanar fluoroscopic guidance. A 0.25 to 0.5 ml. volume of Isovue-250 was injected and a partial facet joint arthrogram was obtained. A single spot film was obtained of the resulting arthrogram.    One to 1.25 ml of the steroid/anesthetic solution was then injected into each  of the facet joints noted above.   Additional Comments:  The patient tolerated the procedure well Dressing: 2 x 2 sterile gauze and Band-Aid    Post-procedure details: Patient was observed during the procedure. Post-procedure instructions were reviewed.  Patient left the  clinic in stable condition.    Clinical History: MRI LUMBAR SPINE WITHOUT AND WITH CONTRAST  TECHNIQUE: Multiplanar and multiecho pulse sequences of the lumbar spine were obtained without and with intravenous contrast.  CONTRAST:  56mL MULTIHANCE GADOBENATE DIMEGLUMINE 529 MG/ML IV SOLN  COMPARISON:  08/02/2015  FINDINGS: Segmentation: Transitional anatomy with the same numbering scheme as on prior was used.  Alignment:  Straightening without listhesis  Vertebrae: No fracture, evidence of discitis, or bone lesion. Intervertebral cage at L4-5. The cage is incorporated into the L4 inferior endplate but not clearly into the L5 superior endplate where there is prominent subsidence.  Conus medullaris and cauda equina: Conus extends to the L1 level. Conus and cauda equina appear normal.  Paraspinal and other soft tissues: Negative  Disc levels:  T12- L1: Unremarkable.  L1-L2: Disc narrowing and bulging with ventral thecal sac flattening. Mild facet spurring. No impingement  L2-L3: Disc narrowing and bulging with posterior element hypertrophy. Stable mild to moderate spinal stenosis. Noncompressive foraminal narrowing bilaterally.  L3-L4: Posterior element hypertrophy. Mild disc narrowing and bulging. Mild to moderate spinal stenosis. Patent foramina  L4-L5: Postoperative disc space with narrowing and bulky left far-lateral spurring. Mild spinal and subarticular recess narrowing without compression or progression. Patent foramina  L5-S1:Incomplete segmentation at the transverse processes. No herniation or impingement.  IMPRESSION: 1. Disc and facet degeneration without progression from 2017. 2. Stable mild-to-moderate spinal stenosis at L2-3 and L3-4. 3. No compressive foraminal narrowing.   Electronically Signed   By: Monte Fantasia M.D.   On: 09/07/2017 11:53     Objective:  VS:  HT:    WT:   BMI:     BP:131/70  HR:69bpm  TEMP:97.7 F  (36.5 C)(Oral)  RESP:  Physical Exam  Ortho Exam Imaging: Xr C-arm No Report  Result Date: 10/16/2018 Please see Notes tab for imaging impression.

## 2018-10-17 NOTE — Telephone Encounter (Signed)
Returned call to pharmacist.  Clarified amiodarone order for pharmacist.

## 2018-10-17 NOTE — Telephone Encounter (Signed)
New Message    Velta Addison is calling from [facility] and says she needs clarification on A new prescription that was sent in today called  Amiodarone    Please call

## 2018-10-18 ENCOUNTER — Telehealth: Payer: Self-pay | Admitting: *Deleted

## 2018-10-18 DIAGNOSIS — R0683 Snoring: Secondary | ICD-10-CM

## 2018-10-18 DIAGNOSIS — I10 Essential (primary) hypertension: Secondary | ICD-10-CM

## 2018-10-18 NOTE — Telephone Encounter (Signed)
  Tommie Raymond, NP  Freada Bergeron, CMA        Lets order a home sleep study please

## 2018-10-18 NOTE — Telephone Encounter (Signed)
-----   Message from Lauralee Evener, [place] sent at 10/08/2018  2:01 PM EDT ----- Regarding: RE: sleep study Ok to schedule sleep study. Per [facility] web portal no PA is required. The order is for a split night study but the note says home so I don't know what to tell you to order. Either way it doesn't need a PA. ----- Message ----- From: Juluis Mire, RN Sent: 10/07/2018  11:36 AM EDT To: [facility] Sleep Studies Subject: sleep study                                    Pt was ordered sleep study back in April by jill mcdaniel - pt needs to be scheduled. Marzetta Board

## 2018-10-22 NOTE — Telephone Encounter (Signed)
Per patient request in lab study ordered.

## 2018-10-24 ENCOUNTER — Telehealth: Payer: Self-pay | Admitting: Interventional Cardiology

## 2018-10-24 ENCOUNTER — Other Ambulatory Visit: Payer: Self-pay | Admitting: Pharmacist

## 2018-10-24 MED ORDER — EVOLOCUMAB 140 MG/ML ~~LOC~~ SOAJ: 140.0000 mg | SUBCUTANEOUS | 11 refills | Status: AC

## 2018-10-24 NOTE — Telephone Encounter (Signed)
  Pt c/o medication issue:  1. Name of Medication: amiodarone (PACERONE) 200 MG tablet  2. How are you currently taking this medication (dosage and times per day)? Take 1 tablet (200 mg total) by mouth 2 (two) times daily  3. Are you having a reaction (difficulty breathing--STAT)?  Tremors, fatigue, lethargic, some shortness of breath upon activity  4. What is your medication issue? Patient states that this medication is causing her to have tremors, fatigue and feel lethargic. She also has some shortness of breath with exertion. She states that it doesn't seem to be helping her afib either. Please advise.

## 2018-10-24 NOTE — Telephone Encounter (Signed)
Call returned to [name].  Per [name] since she has started the amiodarone she has had NEW onset hand tremors, muscle twitching and fatigue.  Feels lethargic like "brain fog".  Also states she can feel her heart "flip flop".  [name] states she could not feel irregular beats before.  Spoke with pharmacist.  Verified these were side effects of amiodarone.  Rediscussed purpose of amiodarone, that it may not convert her to SR right away.  Plan is to "load" her on the amio for a few weeks and then cardiovert.  After that the amio should hold her in SR.  After [name] thought about it, she would like to give the amio a chance vs ablation.  She will see if she can continue to tolerate.  If side effects get worse she will call this nurse back.

## 2018-10-28 NOTE — Telephone Encounter (Signed)
Patient is scheduled for lab study on 11/06/18. Patient understands her sleep study will be done at [facility] sleep lab. Patient understands she will receive a sleep packet in a week or so. Patient understands to call if she does not receive the sleep packet in a timely manner. L[place]e is scheduled for COVID screening on 8/3 prior to sleep study.   Patient agrees with treatment and thanked me for call.

## 2018-10-29 ENCOUNTER — Telehealth: Payer: Self-pay | Admitting: Internal Medicine

## 2018-10-29 NOTE — Telephone Encounter (Addendum)
Called and spoke with patient. What she is most likely seeing in her ostomy bag is her K-Dur "ghost tablets." This is completely normal and she is till getting the medication. The active ingredient is being absorbed but the shell of the tablet is not disintegrating.  Therefore being left in her ostomy bag. Assured patient she has nothing to worry about. Patient appreciative of the call

## 2018-10-29 NOTE — Telephone Encounter (Signed)
Patient called stating is taking amiodarone (PACERONE) 200 MG tablet two times a day and her night pill is in her ostomy bag in the morning. This started happening in the last 4 days.  She wants to know what she should do.

## 2018-11-04 ENCOUNTER — Other Ambulatory Visit (HOSPITAL_COMMUNITY)
Admission: RE | Admit: 2018-11-04 | Discharge: 2018-11-04 | Disposition: A | Discharge status: Home / Self Care | Payer: Medicare Other | Source: Ambulatory Visit | Attending: Cardiology | Admitting: Cardiology

## 2018-11-04 DIAGNOSIS — Z20828 Contact with and (suspected) exposure to other viral communicable diseases: Secondary | ICD-10-CM | POA: Diagnosis not present

## 2018-11-04 DIAGNOSIS — Z01812 Encounter for preprocedural laboratory examination: Secondary | ICD-10-CM | POA: Insufficient documentation

## 2018-11-04 LAB — SARS CORONAVIRUS 2 (TAT 6-24 HRS): SARS Coronavirus 2: NEGATIVE

## 2018-11-05 NOTE — Telephone Encounter (Signed)
Error

## 2018-11-06 ENCOUNTER — Other Ambulatory Visit: Payer: Self-pay

## 2018-11-06 ENCOUNTER — Other Ambulatory Visit (HOSPITAL_BASED_OUTPATIENT_CLINIC_OR_DEPARTMENT_OTHER): Payer: Self-pay | Admitting: Radiology

## 2018-11-06 ENCOUNTER — Ambulatory Visit (HOSPITAL_BASED_OUTPATIENT_CLINIC_OR_DEPARTMENT_OTHER): Payer: Medicare Other | Attending: Cardiology | Admitting: Cardiology

## 2018-11-06 VITALS — Ht 64.5 in | Wt 215.0 lb

## 2018-11-06 DIAGNOSIS — G4733 Obstructive sleep apnea (adult) (pediatric): Secondary | ICD-10-CM | POA: Insufficient documentation

## 2018-11-06 DIAGNOSIS — I480 Paroxysmal atrial fibrillation: Secondary | ICD-10-CM | POA: Diagnosis not present

## 2018-11-06 DIAGNOSIS — R5383 Other fatigue: Secondary | ICD-10-CM

## 2018-11-07 ENCOUNTER — Telehealth: Payer: Self-pay | Admitting: Internal Medicine

## 2018-11-07 NOTE — Telephone Encounter (Signed)
New message   Patient c/o Palpitations:  High priority if patient c/o lightheadedness, shortness of breath, or chest pain  1) How long have you had palpitations/irregular HR/ Afib? Are you having the symptoms now?patient states that has been afib for 3 weeks, yes   2) Are you currently experiencing lightheadedness, SOB or CP? Sob   3) Do you have a history of afib (atrial fibrillation) or irregular heart rhythm? Yes   4) Have you checked your BP or HR? (document readings if available): not today but patient checks every day   5) Are you experiencing any other symptoms? No

## 2018-11-07 NOTE — Procedures (Signed)
Patient Name: [name], [name] Date: 11/06/2018 Gender: Female D.O.B: 09/07/1940 Age (years): 89 Referring Provider: Kathyrn Drown NP Height (inches): 76 Interpreting Physician: Fransico Him MD, ABSM Weight (lbs): 215 RPSGT: Laren Everts BMI: 36 MRN: [id number] Neck Size: 14.00  CLINICAL INFORMATION Sleep Study Type: Split Night CPAP  Indication for sleep study: Fatigue, Hypertension, Obesity, Sleep walking/talking/parasomnias, Snoring, Witnessed Apneas  Epworth Sleepiness Score: 2  SLEEP STUDY TECHNIQUE As per the AASM Manual for the Scoring of Sleep and Associated Events v2.3 (April 2016) with a hypopnea requiring 4% desaturations.  The channels recorded and monitored were frontal, central and occipital EEG, electrooculogram (EOG), submentalis EMG (chin), nasal and oral airflow, thoracic and abdominal wall motion, anterior tibialis EMG, snore microphone, electrocardiogram, and pulse oximetry. Continuous positive airway pressure (CPAP) was initiated when the patient met split night criteria and was titrated according to treat sleep-disordered breathing.  MEDICATIONS Medications self-administered by patient taken the night of the study : N/A  RESPIRATORY PARAMETERS Diagnostic Total AHI (/hr): 39.7  RDI (/hr):54.0  OA Index (/hr):2  CA Index (/hr): 0.0 REM AHI (/hr): N/A  NREM AHI (/hr):39.7 Supine AHI (/hr):N/A  Non-supine AHI (/hr):39.7 Min O2 Sat (%):82.0  Mean O2 (%): 91.7  Time below 88% (min):5.3   Titration Optimal Pressure (cm):N/A  AHI at Optimal Pressure (/hr):N/A  Min O2 at Optimal Pressure (%):N/A Supine % at Optimal (%):100  Sleep % at Optimal (%):N/A   SLEEP ARCHITECTURE The recording time for the entire night was 417.1 minutes.  During a baseline period of 192.3 minutes, the patient slept for 121.0 minutes in REM and nonREM, yielding a sleep efficiency of 62.9%. Sleep onset after lights out was 39.4 minutes with a REM latency of N/A  minutes. The patient spent 10.7% of the night in stage N1 sleep, 89.3% in stage N2 sleep, 0.0% in stage N3 and 0% in REM.  During the titration period of 214.8 minutes, the patient slept for 97.0 minutes in REM and nonREM, yielding a sleep efficiency of 45.2%. Sleep onset after CPAP initiation was 76.7 minutes with a REM latency of 50.0 minutes. The patient spent 11.3% of the night in stage N1 sleep, 80.9% in stage N2 sleep, 0.0% in stage N3 and 7.7% in REM.  CARDIAC DATA The 2 lead EKG demonstrated atrial fibrillation. The mean heart rate was 100.0 beats per minute. Other EKG findings include: PVCs.  LEG MOVEMENT DATA The total Periodic Limb Movements of Sleep (PLMS) were 0. The PLMS index was 0.0 .  IMPRESSIONS - Severe obstructive sleep apnea occurred during the diagnostic portion of the study (AHI = 39.7/hour). An optimal PAP pressure could not be selected due to ongoing respiratory events.  - No significant central sleep apnea occurred during the diagnostic portion of the study (CAI = 0.0/hour). - Moderate oxygen desaturation was noted during the diagnostic portion of the study (Min O2 =82.0%). - The patient snored with moderate snoring volume during the diagnostic portion of the study. - EKG findings include PVCs. - Clinically significant periodic limb movements did not occur during sleep.  DIAGNOSIS - Obstructive Sleep Apnea (327.23 [G47.33 ICD-10])  RECOMMENDATIONS - Trial of auto CPAP therapy from 5 to 20cm H2O with a Small size Philips Respironics Full Face Mask Dreamwear mask and heated humidification. - Avoid alcohol, sedatives and other CNS depressants that may worsen sleep apnea and disrupt normal sleep architecture. - Sleep hygiene should be reviewed to assess factors that may improve sleep quality. - Weight management and regular  exercise should be initiated or continued. - Return to Sleep Center for re-evaluation after 10 weeks of therapy  [Electronically signed]  11/07/2018 11:11 PM  Fransico Him MD, ABSM Diplomate, American Board of Sleep Medicine

## 2018-11-08 ENCOUNTER — Telehealth: Payer: Self-pay | Admitting: *Deleted

## 2018-11-08 NOTE — Telephone Encounter (Signed)
Returned call to Pt.  Per Pt she went home with a cpap from her sleep study on 11/06/2018 and has been doing wonderfully.  However she is missing some parts for her CPAP.   Advised I would forward this to our sleep assistant to follow up.  Also-Pt states we should be expecting a surgical clearance from Dr. Ellene Route for back injections.  Advised Pt would forward to Nina/triage for follow up.

## 2018-11-08 NOTE — Telephone Encounter (Signed)
Informed patient of titration results and verbalized understanding was indicated. Patient understands her sleep study showed they have significant sleep apnea and had successful PAP titration and will be set up with PAP unit. Order is in [place]. Please set patient up for OV in 10 weeks. Upon patient request DME selection is [facility]. Patient understands SHE will be contacted by [facility] to set up HER cpap. Patient understands to call if [facility] does not contact HER with new setup in a timely manner. Patient understands they will be called once confirmation has been received from [facility] that they have received their new machine to schedule 10 week follow up appointment.  [facility] notified of new cpap order  Please add to airview Patient was grateful for the call and thanked me.

## 2018-11-08 NOTE — Telephone Encounter (Signed)
-----   Message from Sueanne Margarita, MD sent at 11/07/2018 11:16 PM EDT ----- Please let patient know that they have significant sleep apnea and had successful PAP titration and will be set up with PAP unit.  Please let DME know that order is in [facility].  Please set patient up for OV in 10 weeks

## 2018-11-08 NOTE — Telephone Encounter (Signed)
Informed patient of titration results and verbalized understanding was indicated. Patient understands her sleep study showed they have significant sleep apnea and had successful PAP titration and will be set up with PAP unit. Order is in [place]. Please set patient up for OV in 10 weeks. Upon patient request DME selection is [facility]. Patient understands SHE will be contacted by [place] to set up HER cpap. Patient understands to call if [facility] does not contact HER with new setup in a timely manner. Patient understands they will be called once confirmation has been received from [facility] that they have received their new machine to schedule 10 week follow up appointment.

## 2018-11-09 ENCOUNTER — Other Ambulatory Visit: Payer: Self-pay | Admitting: Interventional Cardiology

## 2018-11-11 NOTE — Telephone Encounter (Signed)
Age 78, weight 98kg, SCr 0.93 on 10/07/18, last OV July 2020, afib indication

## 2018-11-14 NOTE — Telephone Encounter (Signed)
Reached out to patient and reminded her that the dme gives the insurance company 15 business days to reply back before they order the unit. She understands it has only been 4 business days. Pt is aware and agreeable to treatment.

## 2018-11-20 ENCOUNTER — Other Ambulatory Visit: Payer: Self-pay

## 2018-11-20 ENCOUNTER — Encounter: Payer: Self-pay | Admitting: *Deleted

## 2018-11-20 ENCOUNTER — Ambulatory Visit (INDEPENDENT_AMBULATORY_CARE_PROVIDER_SITE_OTHER): Payer: Medicare Other | Admitting: Student

## 2018-11-20 VITALS — BP 110/68 | HR 77 | Ht 64.5 in | Wt 214.0 lb

## 2018-11-20 DIAGNOSIS — I4819 Other persistent atrial fibrillation: Secondary | ICD-10-CM

## 2018-11-20 DIAGNOSIS — I25709 Atherosclerosis of coronary artery bypass graft(s), unspecified, with unspecified angina pectoris: Secondary | ICD-10-CM

## 2018-11-20 DIAGNOSIS — I10 Essential (primary) hypertension: Secondary | ICD-10-CM | POA: Diagnosis not present

## 2018-11-20 DIAGNOSIS — M5136 Other intervertebral disc degeneration, lumbar region: Secondary | ICD-10-CM

## 2018-11-20 LAB — CBC
Hematocrit: 40.8 % (ref 34.0–46.6)
Hemoglobin: 13.3 g/dL (ref 11.1–15.9)
MCH: 28.7 pg (ref 26.6–33.0)
MCHC: 32.6 g/dL (ref 31.5–35.7)
MCV: 88 fL (ref 79–97)
Platelets: 321 10*3/uL (ref 150–450)
RBC: 4.63 x10E6/uL (ref 3.77–5.28)
RDW: 14 % (ref 11.7–15.4)
WBC: 10.4 10*3/uL (ref 3.4–10.8)

## 2018-11-20 LAB — MAGNESIUM: Magnesium: 1.8 mg/dL (ref 1.6–2.3)

## 2018-11-20 LAB — COMPREHENSIVE METABOLIC PANEL
ALT: 16 IU/L (ref 0–32)
AST: 18 IU/L (ref 0–40)
Albumin/Globulin Ratio: 1.9 (ref 1.2–2.2)
Albumin: 3.9 g/dL (ref 3.7–4.7)
Alkaline Phosphatase: 77 IU/L (ref 39–117)
BUN/Creatinine Ratio: 16 (ref 12–28)
BUN: 16 mg/dL (ref 8–27)
Bilirubin Total: 0.5 mg/dL (ref 0.0–1.2)
CO2: 25 mmol/L (ref 20–29)
Calcium: 9.2 mg/dL (ref 8.7–10.3)
Chloride: 99 mmol/L (ref 96–106)
Creatinine, Ser: 1.03 mg/dL — ABNORMAL HIGH (ref 0.57–1.00)
GFR calc Af Amer: 60 mL/min/{1.73_m2} (ref >59)
GFR calc non Af Amer: 52 mL/min/{1.73_m2} — ABNORMAL LOW (ref >59)
Globulin, Total: 2.1 g/dL (ref 1.5–4.5)
Glucose: 169 mg/dL — ABNORMAL HIGH (ref 65–99)
Potassium: 3.9 mmol/L (ref 3.5–5.2)
Sodium: 141 mmol/L (ref 134–144)
Total Protein: 6 g/dL (ref 6.0–8.5)

## 2018-11-20 LAB — TSH: TSH: 2.71 u[IU]/mL (ref 0.450–4.500)

## 2018-11-20 NOTE — H&P (View-Only) (Signed)
PCP:  Crist Infante, MD Primary Cardiologist: Sinclair Grooms, MD Electrophysiologist: None   [name] is a 78 y.o. female who presents today for routine electrophysiology followup. They are seen for Dr Lovena Le.   Seen last month with persistent Afib and switched from tikosyn to amiodarone with breakthrough and increased symptoms. EKG today shows Atypical atrial flutter with controlled VR. She remains very symptomatic with NYHA III symptoms. She is not SOB at rest or bathing/dressing, but SOB just walking around the house. She denies orthopnea or peripheral edema. No chest pain. She does have occasional palpitations/tachypalpitations. Denies bleeding on eliquis and she has not missed any doses.  She is also struggling with back pain and is being worked up for an injection, but would have to hold her Eliquis.  She had a positive sleep study and will pick up her CPAP on Monday.   Past Medical History:  Diagnosis Date  . Addison's disease ([place])   . Anxiety   . Arthritis    "everywhere"  . Atrial fibrillation (H[facility])    Eliquis  . Chronic bronchitis ([place])    "although I didn't get it this year" (07/23/2017)  . 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  last knee surgery dr. Elmyra Ricks gave large dose of hydrocortisal and avoided the hypotensive side effectas of addison's disease.  . Coronary artery disease   . Depression   . GERD (gastroesophageal reflux disease)   . Hyperlipemia   . Hypertension   . Hypothyroidism   . Lumbar radiculopathy 04/27/2014  . Migraine    "in my 20's" (07/23/2017)  . Recurrent upper respiratory infection (URI)   . UTI (lower urinary tract infection) 07/09/2011   Past Surgical History:  Procedure Laterality Date  . ACHILLES TENDON SURGERY Left 07/16/2014   bone spur removed  . ANTERIOR LUMBAR FUSION  1980   L4-5  . BACK SURGERY    . CARDIAC CATHETERIZATION  2013  . CARDIAC CATHETERIZATION N/A 09/02/2014   Procedure: Left Heart Cath and Coronary Angiography;  Surgeon: Wellington Hampshire, MD;  Location: [facility];  Service: Cardiovascular;  Laterality: N/A;  . CARDIOVERSION N/A 10/22/2015   Procedure: CARDIOVERSION;  Surgeon: Sanda Klein, MD;  Location: [facility];  Service: Cardiovascular;  Laterality: N/A;  . CARDIOVERSION N/A 06/07/2017   Procedure: CARDIOVERSION;  Surgeon: Larey Dresser, MD;  Location: [facility];  Service: Cardiovascular;  Laterality: N/A;  . CARDIOVERSION N/A 07/05/2017   Procedure: CARDIOVERSION;  Surgeon: Pixie Casino, MD;  Location: [facility] ENDOSCOPY;  Service: Cardiovascular;  Laterality: N/A;  . CARDIOVERSION N/A 09/30/2018   Procedure: CARDIOVERSION;  Surgeon: Buford Dresser, MD;  Location: [facility];  Service: Cardiovascular;  Laterality: N/A;  . CARPAL TUNNEL RELEASE Right   . CATARACT EXTRACTION, BILATERAL Bilateral   . COLON RESECTION N/A 03/20/2018   Procedure: LAPAROSCOPIC SIGMOIDECTOMY WITH COLOSTOMY;  Surgeon: Ileana Roup, MD;  Location: [facility];  Service: General;  Laterality: N/A;  . COLON SURGERY     laparascopic vs. open sigmoidectomy with cystoscopy  Dr. Dema Severin and Dr. Gloriann Loan 03-20-18  . COLONOSCOPY WITH PROPOFOL N/A 01/17/2018   Procedure: COLONOSCOPY WITH PROPOFOL;  Surgeon: Milus Banister, MD;  Location: [facility] ENDOSCOPY;  Service: Endoscopy;  Laterality: N/A;  . CORONARY ARTERY BYPASS GRAFT  06/26/2011   Procedure: CORONARY ARTERY BYPASS GRAFTING (CABG);  Surgeon: Melrose Nakayama, MD;  Location: [facility];  Service: Open Heart Surgery;  Laterality: N/A;  times using Greater Saphenous Vein Graft harvested endoscopically from right leg  . CYSTOSCOPY W/ URETERAL STENT PLACEMENT Bilateral 03/20/2018   Procedure: CYSTOSCOPY WITH BILATERAL RETROGRADE AND BILATERAL URETERAL CATHETER PLACEMENT;  Surgeon: Lucas Mallow, MD;  Location: [facility];  Service: Urology;  Laterality: Bilateral;  . DIAGNOSTIC LAPAROSCOPY    . DILATION AND  CURETTAGE OF UTERUS    . JOINT REPLACEMENT    . KNEE ARTHROSCOPY Bilateral   . LAPAROSCOPIC CHOLECYSTECTOMY    . POLYPECTOMY  01/17/2018   Procedure: POLYPECTOMY;  Surgeon: Milus Banister, MD;  Location: [facility];  Service: Endoscopy;;  . REVISION TOTAL KNEE ARTHROPLASTY Right   . SPINAL CORD STIMULATOR IMPLANT  12/10/15  . TAKE DOWN OF INTESTINAL FISTULA N/A 03/20/2018   Procedure: LAPAROSCOPIC TAKE DOWN OF COLOVESICAL FISTULA;  Surgeon: Ileana Roup, MD;  Location: [facility];  Service: General;  Laterality: N/A;  . TONSILLECTOMY    . TOTAL KNEE ARTHROPLASTY Bilateral     Current Outpatient Medications  Medication Sig Dispense Refill  . acetaminophen (TYLENOL) 500 MG tablet Take 500 mg by mouth every 6 (six) hours as needed (headache/pain.).     [name] amiodarone (PACERONE) 200 MG tablet Take 1 tablet (200 mg total) by mouth 2 (two) times daily. 90 tablet 3  . aspirin EC 81 MG tablet Take 81 mg by mouth every Monday, Wednesday, and Friday. In the morning.    . clonazePAM (KLONOPIN) 0.5 MG tablet Take 0.25-0.5 mg by mouth 2 (two) times daily as needed for anxiety.    . Cyanocobalamin 1000 MCG/ML KIT Inject 1 mL into the muscle every 30 (thirty) days. Take one (1) injection as directed every month.    . diazepam (VALIUM) 5 MG tablet Take 1 by mouth 1 hour  pre-procedure with very light food. May bring 2nd tablet to appointment. 2 tablet 0  . diltiazem (CARDIZEM CD) 180 MG 24 hr capsule Take 180 mg by mouth every evening.    [name] ELIQUIS 5 MG TABS tablet TAKE ONE TABLET TWICE DAILY 60 tablet 5  . Evolocumab 140 MG/ML SOAJ Inject 140 mg into the skin every 14 (fourteen) days. 2 pen 11  . FLUoxetine (PROZAC) 10 MG tablet Take 10 mg by mouth daily.     . furosemide (LASIX) 40 MG tablet Take 1 tablet (40 mg total) by mouth daily. 90 tablet 3  . gabapentin (NEURONTIN) 100 MG capsule 1 capsule in the morning and midday, 3 in the evening 150 capsule 3  . hydrocortisone (CORTEF) 20 MG tablet Take  20 mg by mouth 2 (two) times daily.    [name] levothyroxine (SYNTHROID, LEVOTHROID) 88 MCG tablet Take 1 tablet (88 mcg total) by mouth daily before breakfast. 30 tablet 0  . Magnesium 500 MG TABS Take 1,000 mg by mouth at bedtime.    . mirabegron ER (MYRBETRIQ) 50 MG TB24 tablet Take 50 mg by mouth daily.    [name] NITROSTAT 0.4 MG SL tablet ONE TABLET UNDER TONGUE AS NEEDED FOR CHEST PAIN AS DIRECTED 25 tablet 6  . omeprazole (PRILOSEC) 20 MG capsule Take 20 mg by mouth daily.     . polyethylene glycol-electrolytes (NULYTELY/GOLYTELY) 420 g solution Take 4,000 mLs by mouth as directed. 4000 mL 0  . potassium chloride (K-DUR) 10 MEQ tablet Take 2 tablets (20 mEq total) by mouth 2 (two) times daily. 360 tablet 3  . rOPINIRole (REQUIP) 0.25 MG tablet Take 0.5 mg by mouth at bedtime.    . valsartan (DIOVAN)  80 MG tablet Take 80 mg by mouth every evening.     . Vitamin D, Ergocalciferol, (DRISDOL) 50000 UNITS CAPS Take 50,000 Units by mouth 2 (two) times a week. Takes on Tuesdays and Thursdays     No current facility-administered medications for this visit.     Allergies  Allergen Reactions  . Percocet [Oxycodone-Acetaminophen] Other (See Comments)    Hallucination, pt is not allergic to tylenol(acetaminophen)  . Tramadol Other (See Comments)    Hallucinations.  . Sulfa Antibiotics Itching    Vaginal itching  . Sulfacetamide Sodium Itching    Vaginal itching    Social History   Socioeconomic History  . Marital status: Widowed    Spouse name: Not on file  . Number of children: Not on file  . Years of education: Not on file  . Highest education level: Not on file  Occupational History  . Occupation: Retired  Scientific laboratory technician  . Financial resource strain: Not on file  . Food insecurity    Worry: Not on file    Inability: Not on file  . Transportation needs    Medical: Not on file    Non-medical: Not on file  Tobacco Use  . Smoking status: Passive Smoke Exposure - Never Smoker  . Smokeless  tobacco: Never Used  . Tobacco comment: "husband passed in 2010; father passed 1978"  Substance and Sexual Activity  . Alcohol use: Yes    Alcohol/week: 0.0 standard drinks    Comment: glass of wine occasionally   . Drug use: No  . Sexual activity: Not Currently    Birth control/protection: Post-menopausal  Lifestyle  . Physical activity    Days per week: Not on file    Minutes per session: Not on file  . Stress: Not on file  Relationships  . Social Herbalist on phone: Not on file    Gets together: Not on file    Attends religious service: Not on file    Active member of club or organization: Not on file    Attends meetings of clubs or organizations: Not on file    Relationship status: Not on file  . Intimate partner violence    Fear of current or ex partner: Not on file    Emotionally abused: Not on file    Physically abused: Not on file    Forced sexual activity: Not on file  Other Topics Concern  . Not on file  Social History Narrative  . Not on file     Review of Systems: General: No chills, fever, night sweats or weight changes  Cardiovascular:  No chest pain, dyspnea on exertion, edema, orthopnea, palpitations, paroxysmal nocturnal dyspnea Dermatological: No rash, lesions or masses Respiratory: No cough, dyspnea Urologic: No hematuria, dysuria Abdominal: No nausea, vomiting, diarrhea, bright red blood per rectum, melena, or hematemesis Neurologic: No visual changes, weakness, changes in mental status All other systems reviewed and are otherwise negative except as noted above.  Physical Exam: Vitals:   11/20/18 0853  BP: 110/68  Pulse: 77  Weight: 214 lb (97.1 kg)  Height: 5' 4.5" (1.638 m)   Wt Readings from Last 3 Encounters:  11/20/18 214 lb (97.1 kg)  11/06/18 215 lb (97.5 kg)  10/17/18 212 lb 6.4 oz (96.3 kg)     GEN- The patient is well appearing, alert and oriented x 3 today.   HEENT: normocephalic, atraumatic; sclera clear,  conjunctiva pink; hearing intact; oropharynx clear; neck supple, no JVP Lymph- no  cervical lymphadenopathy Lungs- Clear to ausculation bilaterally, normal work of breathing.  No wheezes, rales, rhonchi Heart- Regular rate and rhythm, no murmurs, rubs or gallops, PMI not laterally displaced GI- soft, non-tender, non-distended, bowel sounds present, no hepatosplenomegaly Extremities- no clubbing, cyanosis, or edema; DP/PT/radial pulses 2+ bilaterally MS- no significant deformity or atrophy Skin- warm and dry, no rash or lesion Psych- euthymic mood, full affect Neuro- strength and sensation are intact  EKG is ordered today. Personal review shows atypical atrial flutter with variable AV block at 77 bpm  Assessment and Plan: 1. Persistent Atrial fibrillation/atypical flutter s/p failed DCCV 6/29 on tikosyn:  On eliquis without missing any doses. Continue.  Continue amiodarone 200 mg BID. CMET TSH today for surveillance.  Continue diltiazem 180 mg daily Will schedule for DCCV next week. Discussed importance of Eliquis compliance leading up to and for at least 1 month after. If this fails, she is open to discussing Ablation with Dr. Lovena Le.   2. Chronic diastolic CHF Continue lasix 40 mg daily and Kdur. CMET today. Can take additional 40 mg as needed in setting of persistent atrial flutter.   3. CAD s/p CABG Denies ischemic symptoms. EF 65-70% 09/2015  4. OSA  Recent sleep study with significant sleep apnea. Pt will pick up CPAP on Monday.   5. Chronic back pain She is pending an injection with Dr. Ellene Route, but knows she will have to postpone this for a month or more pending on her course.   Disposition: DCCV next week. Follow up with Afib clinic in 1-2 weeks afterward to see if maintaining NSR, if not, will need to discuss ablation.   Shirley Friar, PA-C  11/20/18 9:04 AM   Greater than 50% of the 25 minute visit was spent in counseling/coordination of care regarding disease  state education, salt/fluid restriction, sliding scale diuretics, and medication compliance.

## 2018-11-20 NOTE — Patient Instructions (Addendum)
Medication Instructions:  Your physician recommends that you continue on your current medications as directed. Please refer to the Current Medication list given to you today.  If you need a refill on your cardiac medications before your next appointment, please call your pharmacy.   Lab work: CMET CBC MAG AND TSH TODAY   If you have labs (blood work) drawn today and your tests are completely normal, you will receive your results only by: [name] MyChart Message (if you have MyChart) OR . A paper copy in the mail If you have any lab test that is abnormal or we need to change your treatment, we will call you to review the results.  Testing/Procedures: NONE ORDERED  TODAY  Follow-Up: At [facility], you and your health needs are our priority.  As part of our continuing mission to provide you with exceptional heart care, we have created designated Provider Care Teams.  These Care Teams include your primary Cardiologist (physician) and Advanced Practice Providers (APPs -  Physician Assistants and Nurse Practitioners) who all work together to provide you with the care you need, when you need it.   You will need a follow up appointment in 2- 3  weeks. POST CARDIOVERSION with Afib Clinic after 11-26-18  Any Other Special Instructions Will Be Listed Below (If Applicable).

## 2018-11-20 NOTE — Progress Notes (Signed)
 PCP:  Perini, Mark, MD Primary Cardiologist: Henry W Smith III, MD Electrophysiologist: None   [name] is a 78 y.o. female who presents today for routine electrophysiology followup. They are seen for Dr Taylor.   Seen last month with persistent Afib and switched from tikosyn to amiodarone with breakthrough and increased symptoms. EKG today shows Atypical atrial flutter with controlled VR. She remains very symptomatic with NYHA III symptoms. She is not SOB at rest or bathing/dressing, but SOB just walking around the house. She denies orthopnea or peripheral edema. No chest pain. She does have occasional palpitations/tachypalpitations. Denies bleeding on eliquis and she has not missed any doses.  She is also struggling with back pain and is being worked up for an injection, but would have to hold her Eliquis.  She had a positive sleep study and will pick up her CPAP on Monday.   Past Medical History:  Diagnosis Date  . Addison's disease (HCC)   . Anxiety   . Arthritis    "everywhere"  . Atrial fibrillation (HCC)    Eliquis  . Chronic bronchitis (HCC)    "although I didn't get it this year" (07/23/2017)  . 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  last knee surgery dr. allusio gave large dose of hydrocortisal and avoided the hypotensive side effectas of addison's disease.  . Coronary artery disease   . Depression   . GERD (gastroesophageal reflux disease)   . Hyperlipemia   . Hypertension   . Hypothyroidism   . Lumbar radiculopathy 04/27/2014  . Migraine    "in my 20's" (07/23/2017)  . Recurrent upper respiratory infection (URI)   . UTI (lower urinary tract infection) 07/09/2011   Past Surgical History:  Procedure Laterality Date  . ACHILLES TENDON SURGERY Left 07/16/2014   bone spur removed  . ANTERIOR LUMBAR FUSION  1980   L4-5  . BACK SURGERY    . CARDIAC CATHETERIZATION  2013  . CARDIAC CATHETERIZATION N/A 09/02/2014   Procedure: Left Heart Cath and Coronary Angiography;  Surgeon: Muhammad A Arida, MD;  Location: [facility];  Service: Cardiovascular;  Laterality: N/A;  . CARDIOVERSION N/A 10/22/2015   Procedure: CARDIOVERSION;  Surgeon: Mihai Croitoru, MD;  Location: [facility];  Service: Cardiovascular;  Laterality: N/A;  . CARDIOVERSION N/A 06/07/2017   Procedure: CARDIOVERSION;  Surgeon: McLean, Dalton S, MD;  Location: [facility];  Service: Cardiovascular;  Laterality: N/A;  . CARDIOVERSION N/A 07/05/2017   Procedure: CARDIOVERSION;  Surgeon: Hilty, Kenneth C, MD;  Location: [facility];  Service: Cardiovascular;  Laterality: N/A;  . CARDIOVERSION N/A 09/30/2018   Procedure: CARDIOVERSION;  Surgeon: Christopher, Bridgette, MD;  Location: [facility];  Service: Cardiovascular;  Laterality: N/A;  . CARPAL TUNNEL RELEASE Right   . CATARACT EXTRACTION, BILATERAL Bilateral   . COLON RESECTION N/A 03/20/2018   Procedure: LAPAROSCOPIC SIGMOIDECTOMY WITH COLOSTOMY;  Surgeon: White, Christopher M, MD;  Location: [facility];  Service: General;  Laterality: N/A;  . COLON SURGERY     laparascopic vs. open sigmoidectomy with cystoscopy  Dr. White and Dr. Bell 03-20-18  . COLONOSCOPY WITH PROPOFOL N/A 01/17/2018   Procedure: COLONOSCOPY WITH PROPOFOL;  Surgeon: Jacobs, Daniel P, MD;  Location: [facility];  Service: Endoscopy;  Laterality: N/A;  . CORONARY ARTERY BYPASS GRAFT  06/26/2011   Procedure: CORONARY ARTERY BYPASS GRAFTING (CABG);  Surgeon: Steven C Hendrickson, MD;  Location: [facility];  Service: Open Heart Surgery;  Laterality: N/A;    times using Greater Saphenous Vein Graft harvested endoscopically from right leg  . CYSTOSCOPY W/ URETERAL STENT PLACEMENT Bilateral 03/20/2018   Procedure: CYSTOSCOPY WITH BILATERAL RETROGRADE AND BILATERAL URETERAL CATHETER PLACEMENT;  Surgeon: Bell, Eugene D III, MD;  Location: [facility];  Service: Urology;  Laterality: Bilateral;  . DIAGNOSTIC LAPAROSCOPY    . DILATION AND  CURETTAGE OF UTERUS    . JOINT REPLACEMENT    . KNEE ARTHROSCOPY Bilateral   . LAPAROSCOPIC CHOLECYSTECTOMY    . POLYPECTOMY  01/17/2018   Procedure: POLYPECTOMY;  Surgeon: Jacobs, Daniel P, MD;  Location: [facility];  Service: Endoscopy;;  . REVISION TOTAL KNEE ARTHROPLASTY Right   . SPINAL CORD STIMULATOR IMPLANT  12/10/15  . TAKE DOWN OF INTESTINAL FISTULA N/A 03/20/2018   Procedure: LAPAROSCOPIC TAKE DOWN OF COLOVESICAL FISTULA;  Surgeon: White, Christopher M, MD;  Location: [facility];  Service: General;  Laterality: N/A;  . TONSILLECTOMY    . TOTAL KNEE ARTHROPLASTY Bilateral     Current Outpatient Medications  Medication Sig Dispense Refill  . acetaminophen (TYLENOL) 500 MG tablet Take 500 mg by mouth every 6 (six) hours as needed (headache/pain.).     . amiodarone (PACERONE) 200 MG tablet Take 1 tablet (200 mg total) by mouth 2 (two) times daily. 90 tablet 3  . aspirin EC 81 MG tablet Take 81 mg by mouth every Monday, Wednesday, and Friday. In the morning.    . clonazePAM (KLONOPIN) 0.5 MG tablet Take 0.25-0.5 mg by mouth 2 (two) times daily as needed for anxiety.    . Cyanocobalamin 1000 MCG/ML KIT Inject 1 mL into the muscle every 30 (thirty) days. Take one (1) injection as directed every month.    . diazepam (VALIUM) 5 MG tablet Take 1 by mouth 1 hour  pre-procedure with very light food. May bring 2nd tablet to appointment. 2 tablet 0  . diltiazem (CARDIZEM CD) 180 MG 24 hr capsule Take 180 mg by mouth every evening.    . ELIQUIS 5 MG TABS tablet TAKE ONE TABLET TWICE DAILY 60 tablet 5  . Evolocumab 140 MG/ML SOAJ Inject 140 mg into the skin every 14 (fourteen) days. 2 pen 11  . FLUoxetine (PROZAC) 10 MG tablet Take 10 mg by mouth daily.     . furosemide (LASIX) 40 MG tablet Take 1 tablet (40 mg total) by mouth daily. 90 tablet 3  . gabapentin (NEURONTIN) 100 MG capsule 1 capsule in the morning and midday, 3 in the evening 150 capsule 3  . hydrocortisone (CORTEF) 20 MG tablet Take  20 mg by mouth 2 (two) times daily.    . levothyroxine (SYNTHROID, LEVOTHROID) 88 MCG tablet Take 1 tablet (88 mcg total) by mouth daily before breakfast. 30 tablet 0  . Magnesium 500 MG TABS Take 1,000 mg by mouth at bedtime.    . mirabegron ER (MYRBETRIQ) 50 MG TB24 tablet Take 50 mg by mouth daily.    . NITROSTAT 0.4 MG SL tablet ONE TABLET UNDER TONGUE AS NEEDED FOR CHEST PAIN AS DIRECTED 25 tablet 6  . omeprazole (PRILOSEC) 20 MG capsule Take 20 mg by mouth daily.     . polyethylene glycol-electrolytes (NULYTELY/GOLYTELY) 420 g solution Take 4,000 mLs by mouth as directed. 4000 mL 0  . potassium chloride (K-DUR) 10 MEQ tablet Take 2 tablets (20 mEq total) by mouth 2 (two) times daily. 360 tablet 3  . rOPINIRole (REQUIP) 0.25 MG tablet Take 0.5 mg by mouth at bedtime.    . valsartan (DIOVAN)   80 MG tablet Take 80 mg by mouth every evening.     . Vitamin D, Ergocalciferol, (DRISDOL) 50000 UNITS CAPS Take 50,000 Units by mouth 2 (two) times a week. Takes on Tuesdays and Thursdays     No current facility-administered medications for this visit.     Allergies  Allergen Reactions  . Percocet [Oxycodone-Acetaminophen] Other (See Comments)    Hallucination, pt is not allergic to tylenol(acetaminophen)  . Tramadol Other (See Comments)    Hallucinations.  . Sulfa Antibiotics Itching    Vaginal itching  . Sulfacetamide Sodium Itching    Vaginal itching    Social History   Socioeconomic History  . Marital status: Widowed    Spouse name: Not on file  . Number of children: Not on file  . Years of education: Not on file  . Highest education level: Not on file  Occupational History  . Occupation: Retired  Social Needs  . Financial resource strain: Not on file  . Food insecurity    Worry: Not on file    Inability: Not on file  . Transportation needs    Medical: Not on file    Non-medical: Not on file  Tobacco Use  . Smoking status: Passive Smoke Exposure - Never Smoker  . Smokeless  tobacco: Never Used  . Tobacco comment: "husband passed in 2010; father passed 1978"  Substance and Sexual Activity  . Alcohol use: Yes    Alcohol/week: 0.0 standard drinks    Comment: glass of wine occasionally   . Drug use: No  . Sexual activity: Not Currently    Birth control/protection: Post-menopausal  Lifestyle  . Physical activity    Days per week: Not on file    Minutes per session: Not on file  . Stress: Not on file  Relationships  . Social connections    Talks on phone: Not on file    Gets together: Not on file    Attends religious service: Not on file    Active member of club or organization: Not on file    Attends meetings of clubs or organizations: Not on file    Relationship status: Not on file  . Intimate partner violence    Fear of current or ex partner: Not on file    Emotionally abused: Not on file    Physically abused: Not on file    Forced sexual activity: Not on file  Other Topics Concern  . Not on file  Social History Narrative  . Not on file     Review of Systems: General: No chills, fever, night sweats or weight changes  Cardiovascular:  No chest pain, dyspnea on exertion, edema, orthopnea, palpitations, paroxysmal nocturnal dyspnea Dermatological: No rash, lesions or masses Respiratory: No cough, dyspnea Urologic: No hematuria, dysuria Abdominal: No nausea, vomiting, diarrhea, bright red blood per rectum, melena, or hematemesis Neurologic: No visual changes, weakness, changes in mental status All other systems reviewed and are otherwise negative except as noted above.  Physical Exam: Vitals:   11/20/18 0853  BP: 110/68  Pulse: 77  Weight: 214 lb (97.1 kg)  Height: 5' 4.5" (1.638 m)   Wt Readings from Last 3 Encounters:  11/20/18 214 lb (97.1 kg)  11/06/18 215 lb (97.5 kg)  10/17/18 212 lb 6.4 oz (96.3 kg)     GEN- The patient is well appearing, alert and oriented x 3 today.   HEENT: normocephalic, atraumatic; sclera clear,  conjunctiva pink; hearing intact; oropharynx clear; neck supple, no JVP Lymph- no   cervical lymphadenopathy Lungs- Clear to ausculation bilaterally, normal work of breathing.  No wheezes, rales, rhonchi Heart- Regular rate and rhythm, no murmurs, rubs or gallops, PMI not laterally displaced GI- soft, non-tender, non-distended, bowel sounds present, no hepatosplenomegaly Extremities- no clubbing, cyanosis, or edema; DP/PT/radial pulses 2+ bilaterally MS- no significant deformity or atrophy Skin- warm and dry, no rash or lesion Psych- euthymic mood, full affect Neuro- strength and sensation are intact  EKG is ordered today. Personal review shows atypical atrial flutter with variable AV block at 77 bpm  Assessment and Plan: 1. Persistent Atrial fibrillation/atypical flutter s/p failed DCCV 6/29 on tikosyn:  On eliquis without missing any doses. Continue.  Continue amiodarone 200 mg BID. CMET TSH today for surveillance.  Continue diltiazem 180 mg daily Will schedule for DCCV next week. Discussed importance of Eliquis compliance leading up to and for at least 1 month after. If this fails, she is open to discussing Ablation with Dr. Taylor.   2. Chronic diastolic CHF Continue lasix 40 mg daily and Kdur. CMET today. Can take additional 40 mg as needed in setting of persistent atrial flutter.   3. CAD s/p CABG Denies ischemic symptoms. EF 65-70% 09/2015  4. OSA  Recent sleep study with significant sleep apnea. Pt will pick up CPAP on Monday.   5. Chronic back pain She is pending an injection with Dr. Elsner, but knows she will have to postpone this for a month or more pending on her course.   Disposition: DCCV next week. Follow up with Afib clinic in 1-2 weeks afterward to see if maintaining NSR, if not, will need to discuss ablation.   Michael Andrew Tillery, PA-C  11/20/18 9:04 AM   Greater than 50% of the 25 minute visit was spent in counseling/coordination of care regarding disease  state education, salt/fluid restriction, sliding scale diuretics, and medication compliance.   

## 2018-11-21 ENCOUNTER — Telehealth: Payer: Self-pay | Admitting: Interventional Cardiology

## 2018-11-21 NOTE — Telephone Encounter (Signed)
Spoke to patient her cpap fitting has been rescheduled for 8/26.

## 2018-11-21 NOTE — Telephone Encounter (Signed)
New Message  Patient is scheduled for her her covid testing on tomorrow 11/22/18 and has the cardioversion on 11/26/18, patient states that she needs to go on Monday 11/25/18 to get her CPAP equipment. Patient wants to know can she push back the Covid test to after she gets her CPAP equipment on 11/25/18. Please give patient a call back to advise.

## 2018-11-21 NOTE — Telephone Encounter (Signed)
Pt wanting to move back her COVID screen, advised against it d/t the amount of time test results take to come back and it is necessary for her to have cardioversion done. Advised pt to try to reschedule her CPAP fitting rather than moving her cardioversion. She agreed and said she would call back once that has been taken care of.

## 2018-11-22 ENCOUNTER — Other Ambulatory Visit (HOSPITAL_COMMUNITY)
Admission: RE | Admit: 2018-11-22 | Discharge: 2018-11-22 | Disposition: A | Discharge status: Home / Self Care | Payer: Medicare Other | Source: Ambulatory Visit | Attending: Cardiovascular Disease | Admitting: Cardiovascular Disease

## 2018-11-22 DIAGNOSIS — Z20828 Contact with and (suspected) exposure to other viral communicable diseases: Secondary | ICD-10-CM | POA: Diagnosis not present

## 2018-11-22 DIAGNOSIS — Z01812 Encounter for preprocedural laboratory examination: Secondary | ICD-10-CM | POA: Insufficient documentation

## 2018-11-22 LAB — SARS CORONAVIRUS 2 (TAT 6-24 HRS): SARS Coronavirus 2: NEGATIVE

## 2018-11-25 NOTE — Progress Notes (Signed)
Pt states they have been quarantined since covid test. Denies fever/shortness of breath, cough. Pt understands visitor policy.

## 2018-11-26 ENCOUNTER — Ambulatory Visit (HOSPITAL_COMMUNITY): Payer: Medicare Other | Admitting: Anesthesiology

## 2018-11-26 ENCOUNTER — Encounter (HOSPITAL_COMMUNITY)
Admission: RE | Disposition: A | Discharge status: Home / Self Care | Payer: Self-pay | Source: Home / Self Care | Attending: Cardiovascular Disease

## 2018-11-26 ENCOUNTER — Encounter (HOSPITAL_COMMUNITY): Payer: Self-pay

## 2018-11-26 ENCOUNTER — Ambulatory Visit (HOSPITAL_COMMUNITY)
Admission: RE | Admit: 2018-11-26 | Discharge: 2018-11-26 | Disposition: A | Discharge status: Home / Self Care | Payer: Medicare Other | Attending: Cardiovascular Disease | Admitting: Cardiovascular Disease

## 2018-11-26 ENCOUNTER — Other Ambulatory Visit: Payer: Self-pay | Admitting: *Deleted

## 2018-11-26 ENCOUNTER — Other Ambulatory Visit: Payer: Self-pay

## 2018-11-26 DIAGNOSIS — E271 Primary adrenocortical insufficiency: Secondary | ICD-10-CM | POA: Insufficient documentation

## 2018-11-26 DIAGNOSIS — F419 Anxiety disorder, unspecified: Secondary | ICD-10-CM | POA: Diagnosis not present

## 2018-11-26 DIAGNOSIS — M1990 Unspecified osteoarthritis, unspecified site: Secondary | ICD-10-CM | POA: Diagnosis not present

## 2018-11-26 DIAGNOSIS — Z882 Allergy status to sulfonamides status: Secondary | ICD-10-CM | POA: Insufficient documentation

## 2018-11-26 DIAGNOSIS — I4819 Other persistent atrial fibrillation: Secondary | ICD-10-CM | POA: Diagnosis present

## 2018-11-26 DIAGNOSIS — I5032 Chronic diastolic (congestive) heart failure: Secondary | ICD-10-CM | POA: Insufficient documentation

## 2018-11-26 DIAGNOSIS — Z951 Presence of aortocoronary bypass graft: Secondary | ICD-10-CM | POA: Diagnosis not present

## 2018-11-26 DIAGNOSIS — E039 Hypothyroidism, unspecified: Secondary | ICD-10-CM | POA: Diagnosis not present

## 2018-11-26 DIAGNOSIS — I2510 Atherosclerotic heart disease of native coronary artery without angina pectoris: Secondary | ICD-10-CM | POA: Diagnosis not present

## 2018-11-26 DIAGNOSIS — Z7982 Long term (current) use of aspirin: Secondary | ICD-10-CM | POA: Insufficient documentation

## 2018-11-26 DIAGNOSIS — E785 Hyperlipidemia, unspecified: Secondary | ICD-10-CM | POA: Insufficient documentation

## 2018-11-26 DIAGNOSIS — F329 Major depressive disorder, single episode, unspecified: Secondary | ICD-10-CM | POA: Insufficient documentation

## 2018-11-26 DIAGNOSIS — Z79899 Other long term (current) drug therapy: Secondary | ICD-10-CM | POA: Diagnosis not present

## 2018-11-26 DIAGNOSIS — Z885 Allergy status to narcotic agent status: Secondary | ICD-10-CM | POA: Insufficient documentation

## 2018-11-26 DIAGNOSIS — Z7901 Long term (current) use of anticoagulants: Secondary | ICD-10-CM | POA: Diagnosis not present

## 2018-11-26 DIAGNOSIS — K219 Gastro-esophageal reflux disease without esophagitis: Secondary | ICD-10-CM | POA: Diagnosis not present

## 2018-11-26 DIAGNOSIS — I110 Hypertensive heart disease with heart failure: Secondary | ICD-10-CM | POA: Insufficient documentation

## 2018-11-26 HISTORY — PX: CARDIOVERSION: SHX1299

## 2018-11-26 SURGERY — CARDIOVERSION
Anesthesia: General

## 2018-11-26 MED ORDER — SODIUM CHLORIDE 0.9 % IV SOLN
INTRAVENOUS | Status: DC | PRN
  Administered 2018-11-26: 14:00:00 via INTRAVENOUS

## 2018-11-26 MED ORDER — PROPOFOL 10 MG/ML IV BOLUS
INTRAVENOUS | Status: DC | PRN
  Administered 2018-11-26: 60 mg via INTRAVENOUS

## 2018-11-26 MED ORDER — MAGNESIUM OXIDE 400 MG PO TABS: 400.0000 mg | ORAL_TABLET | Freq: Every day | ORAL | 1 refills | Status: DC

## 2018-11-26 MED ORDER — LIDOCAINE 2% (20 MG/ML) 5 ML SYRINGE
INTRAMUSCULAR | Status: DC | PRN
  Administered 2018-11-26: 40 mg via INTRAVENOUS

## 2018-11-26 NOTE — Interval H&P Note (Signed)
History and Physical Interval Note:  11/26/2018 1:46 PM  [name]  has presented today for surgery, with the diagnosis of AFIB.  The various methods of treatment have been discussed with the patient and family. After consideration of risks, benefits and other options for treatment, the patient has consented to  Procedure(s): CARDIOVERSION (N/A) as a surgical intervention.  The patient's history has been reviewed, patient examined, no change in status, stable for surgery.  I have reviewed the patient's chart and labs.  Questions were answered to the patient's satisfaction.     Auriah Hollings

## 2018-11-26 NOTE — Anesthesia Preprocedure Evaluation (Signed)
Anesthesia Evaluation  Patient identified by MRN, date of birth, ID band Patient awake    Reviewed: Allergy & Precautions, NPO status , Patient's Chart, lab work & pertinent test results  History of Anesthesia Complications Negative for: history of anesthetic complications  Airway Mallampati: II  TM Distance: >3 FB Neck ROM: Full    Dental  (+) Dental Advisory Given, Caps   Pulmonary neg pulmonary ROS,  11/22/2018 SARS coronavirus NEG   breath sounds clear to auscultation       Cardiovascular hypertension, + CAD and + CABG  (-) Cardiac Stents  Rhythm:Irregular Rate:Normal  '17 ECHO: EF 65-70%, mild MR '16 Cath: Significant three-vessel coronary artery with patent grafts including LIMA to LAD, SVG to OM and SVG to RCA, Normal left ventricular end-diastolic pressure. Normal LV systolic function by echo.   Neuro/Psych  Headaches,    GI/Hepatic Neg liver ROS, GERD  Controlled,  Endo/Other  Hypothyroidism   Renal/GU negative Renal ROS     Musculoskeletal   Abdominal (+) + obese,   Peds  Hematology obese   Anesthesia Other Findings   Reproductive/Obstetrics                             Anesthesia Physical Anesthesia Plan  ASA: III  Anesthesia Plan: General   Post-op Pain Management:    Induction: Intravenous  PONV Risk Score and Plan: 3 and Treatment may vary due to age or medical condition  Airway Management Planned: Natural Airway and Mask  Additional Equipment:   Intra-op Plan:   Post-operative Plan:   Informed Consent: I have reviewed the patients History and Physical, chart, labs and discussed the procedure including the risks, benefits and alternatives for the proposed anesthesia with the patient or authorized representative who has indicated his/her understanding and acceptance.     Dental advisory given  Plan Discussed with: CRNA and Surgeon  Anesthesia Plan Comments:          Anesthesia Quick Evaluation

## 2018-11-26 NOTE — Op Note (Signed)
Procedure: Electrical Cardioversion Indications:  Atrial Fibrillation  Procedure Details:  Consent: Risks of procedure as well as the alternatives and risks of each were explained to the (patient/caregiver).  Consent for procedure obtained.  Time Out: Verified patient identification, verified procedure, site/side was marked, verified correct patient position, special equipment/implants available, medications/allergies/relevent history reviewed, required imaging and test results available.  Performed  Patient placed on cardiac monitor, pulse oximetry, supplemental oxygen as necessary.  Sedation given: Propofol 60 mg IV Pacer pads placed anterior and posterior chest.  Cardioverted 1 time(s).  Cardioversion with synchronized biphasic 200J shock.  Evaluation: Findings: Post procedure EKG shows: NSR Complications: None Patient did tolerate procedure well.  Time Spent Directly with the Patient:  30 minutes   [name] 11/26/2018, 2:30 PM

## 2018-11-26 NOTE — Discharge Instructions (Signed)

## 2018-11-26 NOTE — Anesthesia Postprocedure Evaluation (Signed)
Anesthesia Post Note  Patient: [name]  Procedure(s) Performed: CARDIOVERSION (N/A )     Patient location during evaluation: Endoscopy Anesthesia Type: General Level of consciousness: awake and alert, patient cooperative and oriented Pain management: pain level controlled Vital Signs Assessment: post-procedure vital signs reviewed and stable Respiratory status: spontaneous breathing, nonlabored ventilation and respiratory function stable Cardiovascular status: blood pressure returned to baseline and stable Postop Assessment: no apparent nausea or vomiting Anesthetic complications: no    Last Vitals:  Vitals:   11/26/18 1440 11/26/18 1450  BP: 117/67 (!) 141/129  Pulse: 69   Resp: (!) 23 18  Temp:    SpO2: 92% 95%    Last Pain:  Vitals:   11/26/18 1450  TempSrc:   PainSc: 0-No pain                 Jusitn Salsgiver,E. Falan Hensler

## 2018-11-26 NOTE — Transfer of Care (Signed)
Immediate Anesthesia Transfer of Care Note  Patient: [name]  Procedure(s) Performed: CARDIOVERSION (N/A )  Patient Location: PACU  Anesthesia Type:General  Level of Consciousness: awake and alert   Airway & Oxygen Therapy: Patient Spontanous Breathing and Patient connected to nasal cannula oxygen  Post-op Assessment: Report given to RN and Post -op Vital signs reviewed and stable  Post vital signs: Reviewed and stable  Last Vitals:  Vitals Value Taken Time  BP    Temp    Pulse 66 11/26/18 1429  Resp 23 11/26/18 1429  SpO2 96 % 11/26/18 1429    Last Pain:  Vitals:   11/26/18 1400  TempSrc: Temporal  PainSc: 0-No pain         Complications: No apparent anesthesia complications

## 2018-11-27 ENCOUNTER — Encounter (HOSPITAL_COMMUNITY): Payer: Self-pay | Admitting: Cardiovascular Disease

## 2018-12-10 ENCOUNTER — Other Ambulatory Visit: Payer: Self-pay | Admitting: Interventional Cardiology

## 2018-12-11 ENCOUNTER — Other Ambulatory Visit: Payer: Self-pay

## 2018-12-11 ENCOUNTER — Ambulatory Visit (HOSPITAL_COMMUNITY)
Admission: RE | Admit: 2018-12-11 | Discharge: 2018-12-11 | Disposition: A | Discharge status: Home / Self Care | Payer: Medicare Other | Source: Ambulatory Visit | Attending: Nurse Practitioner | Admitting: Nurse Practitioner

## 2018-12-11 ENCOUNTER — Encounter (HOSPITAL_COMMUNITY): Payer: Self-pay | Admitting: Nurse Practitioner

## 2018-12-11 VITALS — BP 140/72 | HR 70 | Ht 64.5 in | Wt 214.6 lb

## 2018-12-11 DIAGNOSIS — I480 Paroxysmal atrial fibrillation: Secondary | ICD-10-CM | POA: Insufficient documentation

## 2018-12-11 DIAGNOSIS — Z8249 Family history of ischemic heart disease and other diseases of the circulatory system: Secondary | ICD-10-CM | POA: Insufficient documentation

## 2018-12-11 DIAGNOSIS — M1990 Unspecified osteoarthritis, unspecified site: Secondary | ICD-10-CM | POA: Diagnosis not present

## 2018-12-11 DIAGNOSIS — Z882 Allergy status to sulfonamides status: Secondary | ICD-10-CM | POA: Insufficient documentation

## 2018-12-11 DIAGNOSIS — I4819 Other persistent atrial fibrillation: Secondary | ICD-10-CM | POA: Diagnosis present

## 2018-12-11 DIAGNOSIS — Z96653 Presence of artificial knee joint, bilateral: Secondary | ICD-10-CM | POA: Insufficient documentation

## 2018-12-11 DIAGNOSIS — I2510 Atherosclerotic heart disease of native coronary artery without angina pectoris: Secondary | ICD-10-CM | POA: Diagnosis not present

## 2018-12-11 DIAGNOSIS — Z7901 Long term (current) use of anticoagulants: Secondary | ICD-10-CM | POA: Insufficient documentation

## 2018-12-11 DIAGNOSIS — Z79899 Other long term (current) drug therapy: Secondary | ICD-10-CM | POA: Diagnosis not present

## 2018-12-11 DIAGNOSIS — Z885 Allergy status to narcotic agent status: Secondary | ICD-10-CM | POA: Diagnosis not present

## 2018-12-11 DIAGNOSIS — G4730 Sleep apnea, unspecified: Secondary | ICD-10-CM | POA: Diagnosis not present

## 2018-12-11 DIAGNOSIS — F329 Major depressive disorder, single episode, unspecified: Secondary | ICD-10-CM | POA: Insufficient documentation

## 2018-12-11 DIAGNOSIS — Z9049 Acquired absence of other specified parts of digestive tract: Secondary | ICD-10-CM | POA: Diagnosis not present

## 2018-12-11 DIAGNOSIS — F419 Anxiety disorder, unspecified: Secondary | ICD-10-CM | POA: Insufficient documentation

## 2018-12-11 DIAGNOSIS — Z7982 Long term (current) use of aspirin: Secondary | ICD-10-CM | POA: Insufficient documentation

## 2018-12-11 DIAGNOSIS — E271 Primary adrenocortical insufficiency: Secondary | ICD-10-CM | POA: Insufficient documentation

## 2018-12-11 DIAGNOSIS — Z79890 Hormone replacement therapy (postmenopausal): Secondary | ICD-10-CM | POA: Diagnosis not present

## 2018-12-11 DIAGNOSIS — I10 Essential (primary) hypertension: Secondary | ICD-10-CM | POA: Diagnosis not present

## 2018-12-11 DIAGNOSIS — Z951 Presence of aortocoronary bypass graft: Secondary | ICD-10-CM | POA: Diagnosis not present

## 2018-12-11 DIAGNOSIS — E785 Hyperlipidemia, unspecified: Secondary | ICD-10-CM | POA: Diagnosis not present

## 2018-12-11 DIAGNOSIS — Z823 Family history of stroke: Secondary | ICD-10-CM | POA: Diagnosis not present

## 2018-12-11 DIAGNOSIS — Z7952 Long term (current) use of systemic steroids: Secondary | ICD-10-CM | POA: Diagnosis not present

## 2018-12-11 DIAGNOSIS — E039 Hypothyroidism, unspecified: Secondary | ICD-10-CM | POA: Insufficient documentation

## 2018-12-11 DIAGNOSIS — K219 Gastro-esophageal reflux disease without esophagitis: Secondary | ICD-10-CM | POA: Diagnosis not present

## 2018-12-11 MED ORDER — AMIODARONE HCL 200 MG PO TABS: 200.0000 mg | ORAL_TABLET | Freq: Every day | ORAL | 3 refills | Status: DC

## 2018-12-11 NOTE — Addendum Note (Signed)
Encounter addended by: Juluis Mire, RN on: 12/11/2018 11:57 AM  Actions taken: Order list changed, Diagnosis association updated

## 2018-12-11 NOTE — Progress Notes (Signed)
Primary Care Physician: Crist Infante, MD Referring Physician: Dr. Jannet Askew is a 78 y.o. female with a h/o coronary artery disease with prior bypass grafting, atrial fibrillation resulting in acute diastolic heart failure, hypertension, chronic anticoagulation therapy with Eliquisand hyperlipidemia.Was on amiodarone which was subsequently discontinued  In 2017,2/2 to intolerance, but pt can not remember in which  way she did not tolerate.  She was found to be in  afib 3/6 when seen by [name] and subsequently had successful cardioversion 3/7. She was seen in the afib clinic, 3/11, at Dr. Thompson Caul suggestion to get pt on sotalol, as she is very symptomatic in afib.[name]  She was in SR and feels improved. She is also on eliquis 5 mg bid and has not had any missed doses. She had a long qt around 500 ms and discussed with Dr. Rayann Heman who suggested that pt contact PCP to see if Prozac dose  could be cut in half, to minimize qt to get sotalol on board. Pt did this and qt did shorten.  Pt is in clinic, 07/03/17, and is ready to come into hospital for sotalol load. She started feeling lightheaded yesterday and is found to be in  afib with v rate at 117 bpm. No missed doses of eliquis 5 mg bid. No benadryl use.  F/u sotalol load 07/13/17. Pt is staying in rhythm but states " I can not live like this" and wants to stop drug. She has felt draggy, lightheaded, no energy since she started drug. We discussed cutting the dose in half instead of stopping drug to see if she can still maintain SR and she is willing to do this.  F/u 07/19/17. She called the office Monday and c/o of not being able to tolerate the sotalol, even at 40 mg bid, "still can't function , I have no energy." So she did not have sotalol on Tuesday,felt good, but started feeling poorly yesterday.Ekg shows afib at 106 bpm. She has checked on the price of tikosyn and with mail order can afford drug and is wanting to come into hospital next  week for Tikosyn load.   F/u in afib clinic, pt is here for tikosyn load. She continues in afib,rate controlled. She does not feel well in afib, "drunk" no energy.No missed doses of eliquis.  F/u Tikosyn load, 5/2. She  Had qtc prolongation with  Higher doses of Tikosyn and dose had to be lowered to 125 mcg   a day. She follows up today, in SR, but has the same complaints that she did on sotalol. Some days she feel ok but mostly feels draggy, low energy, woozy, lightheaded, short of breath. I explained to her that usually getting people back in SR improves their energy and they feel much better, shortness of breath improves.Phyllis Ginger does not make people feel bad. Sotalol can cause fatigue in some people. Her Prozac dose was decreased to get antiarrythmic's on board and I question if this had an effect. She is overdue to see her endocrinologist, the end of the month, her PCP for physical this next week, and [name] May 7th. Her BP med has been reduced once and possibly hypotension is contributing to symptoms. Standing BP today 110/60, not significantly lower at 114/58 sitting.  F/u in afib clinic 10/07/2018. She had a colostomy last December and went into afib sometime after her surgery on return home. She was aware that she was out of rhythm, she  just didn't want  to deal it then. She saw [name] 6/25 and found to be in persistent afib and had gained around 10 lbs of fluid, very much short of breath with exertion.[name] He scheduled her for a cardioversion and increased her lasix to daily from 20 mg bid.She felt improved for about 4-5 days after the cardioversion but then felt slightly winded, starting a few days ago. She feels much better in afib (rate controlled) than she did before the DCCV as she is back to her base weight (206-208 lbs at home). She has failed sotalol (extreme fatigue, now having breakthrough afib on tikosyn, and is very afraid to take amiodarone as her husband took it and she is afraid it  may have contributed to his death, although she says he died from lung ca. She is rate controlled today and weight is at baseline.She still feels better today in afib, than before cardioversion.  She has been waking up at night with her heart racing for a few minutes, has a sleep study pending. wears an oral appliance now for sleep apnea, although she has never been tested.  F/u afib clinic 12/11/18. She is her f/u loading of amiodarone 200 mg bid and was successfully cardioverted 2 weeks ago. She continues on amiodarone 200 mg bid.  She has noted some palpitations recently but is in SR today. She will feel nervous in her chest but has a regular pulse. She has been using cpap for 2 weeks and is resting better. Will often feel the palpitations at night but can notice during day as well. She is having a lot of back issues and wants to stop blood thinner after 4 weeks after cardioversion to have a back injection.   Today, she denies symptoms of  chest pain, shortness of breath, orthopnea, PND, lower extremity edema, dizziness, presyncope, syncope, or neurologic sequela.+ mild  shortness of breath.  Past Medical History:  Diagnosis Date   Addison's disease [facility])    Anxiety    Arthritis    "everywhere"   Atrial fibrillation ([place])    Eliquis   Chronic bronchitis ([place])    "although I didn't get it this year" (8/34/1963)   Complication of anesthesia    addison's disease causes hypotension after any surgery .  last knee surgery dr. Elmyra Ricks gave large dose of hydrocortisal and avoided the hypotensive side effectas of addison's disease.   Coronary artery disease    Depression    GERD (gastroesophageal reflux disease)    Hyperlipemia    Hypertension    Hypothyroidism    Lumbar radiculopathy 04/27/2014   Migraine    "in my 20's" (07/23/2017)   Recurrent upper respiratory infection (URI)    UTI (lower urinary tract infection) 07/09/2011   Past Surgical History:  Procedure Laterality Date     ACHILLES TENDON SURGERY Left 07/16/2014   bone spur removed   ANTERIOR LUMBAR FUSION  1980   L4-5   BACK SURGERY     CARDIAC CATHETERIZATION  2013   CARDIAC CATHETERIZATION N/A 09/02/2014   Procedure: Left Heart Cath and Coronary Angiography;  Surgeon: Wellington Hampshire, MD;  Location: [place] CV LAB;  Service: Cardiovascular;  Laterality: N/A;   CARDIOVERSION N/A 10/22/2015   Procedure: CARDIOVERSION;  Surgeon: Sanda Klein, MD;  Location: [place] ENDOSCOPY;  Service: Cardiovascular;  Laterality: N/A;   CARDIOVERSION N/A 06/07/2017   Procedure: CARDIOVERSION;  Surgeon: Larey Dresser, MD;  Location: [facility] ENDOSCOPY;  Service: Cardiovascular;  Laterality: N/A;   CARDIOVERSION N/A 07/05/2017  Procedure: CARDIOVERSION;  Surgeon: Pixie Casino, MD;  Location: [place];  Service: Cardiovascular;  Laterality: N/A;   CARDIOVERSION N/A 09/30/2018   Procedure: CARDIOVERSION;  Surgeon: Buford Dresser, MD;  Location: [place];  Service: Cardiovascular;  Laterality: N/A;   CARDIOVERSION N/A 11/26/2018   Procedure: CARDIOVERSION;  Surgeon: Sanda Klein, MD;  Location: [place];  Service: Cardiovascular;  Laterality: N/A;   CARPAL TUNNEL RELEASE Right    CATARACT EXTRACTION, BILATERAL Bilateral    COLON RESECTION N/A 03/20/2018   Procedure: LAPAROSCOPIC SIGMOIDECTOMY WITH COLOSTOMY;  Surgeon: Ileana Roup, MD;  Location: [facility];  Service: General;  Laterality: N/A;   COLON SURGERY     laparascopic vs. open sigmoidectomy with cystoscopy  Dr. Dema Severin and Dr. Gloriann Loan 03-20-18   COLONOSCOPY WITH PROPOFOL N/A 01/17/2018   Procedure: COLONOSCOPY WITH PROPOFOL;  Surgeon: Milus Banister, MD;  Location: [facility] END[facility]COPY;  Service: Endoscopy;  Laterality: N/A;   CORONARY ARTERY BYPASS GRAFT  06/26/2011   Procedure: CORONARY ARTERY BYPASS GRAFTING (CABG);  Surgeon: Melrose Nakayama, MD;  Location: [place];  Service: Open Heart Surgery;  Laterality: N/A;  times using Greater  Saphenous Vein Graft harvested endoscopically from right leg   CYSTOSCOPY W/ URETERAL STENT PLACEMENT Bilateral 03/20/2018   Procedure: CYSTOSCOPY WITH BILATERAL RETROGRADE AND BILATERAL URETERAL CATHETER PLACEMENT;  Surgeon: Lucas Mallow, MD;  Location: [facility];  Service: Urology;  Laterality: Bilateral;   DIAGNOSTIC LAPAROSCOPY     DILATION AND CURETTAGE OF UTERUS     JOINT REPLACEMENT     KNEE ARTHROSCOPY Bilateral    LAPAROSCOPIC CHOLECYSTECTOMY     POLYPECTOMY  01/17/2018   Procedure: POLYPECTOMY;  Surgeon: Milus Banister, MD;  Location: [facility];  Service: Endoscopy;;   REVISION TOTAL KNEE ARTHROPLASTY Right    SPINAL CORD STIMULATOR IMPLANT  12/10/15   TAKE DOWN OF INTESTINAL FISTULA N/A 03/20/2018   Procedure: LAPAROSCOPIC TAKE DOWN OF COLOVESICAL FISTULA;  Surgeon: Ileana Roup, MD;  Location: [facility];  Service: General;  Laterality: N/A;   TONSILLECTOMY     TOTAL KNEE ARTHROPLASTY Bilateral     Current Outpatient Medications  Medication Sig Dispense Refill   amiodarone (PACERONE) 200 MG tablet Take 1 tablet (200 mg total) by mouth daily. 90 tablet 3   aspirin EC 81 MG tablet Take 81 mg by mouth every Monday, Wednesday, and Friday. In the morning.     clonazePAM (KLONOPIN) 0.5 MG tablet Take 0.25-0.5 mg by mouth 2 (two) times daily as needed for anxiety.     Cyanocobalamin 1000 MCG/ML KIT Inject 1,000 mcg into the muscle every 30 (thirty) days. Take one (1) injection as directed every month.     diltiazem (CARDIZEM CD) 180 MG 24 hr capsule Take 180 mg by mouth every evening.     ELIQUIS 5 MG TABS tablet TAKE ONE TABLET TWICE DAILY (Patient taking differently: Take 5 mg by mouth 2 (two) times daily. ) 60 tablet 5   Evolocumab 140 MG/ML SOAJ Inject 140 mg into the skin every 14 (fourteen) days. 2 pen 11   FLUoxetine (PROZAC) 10 MG tablet Take 10 mg by mouth daily.      furosemide (LASIX) 40 MG tablet Take 1 tablet (40 mg total) by mouth daily.  90 tablet 3   hydrocortisone (CORTEF) 20 MG tablet Take 20 mg by mouth 2 (two) times daily.     levothyroxine (SYNTHROID, LEVOTHROID) 88 MCG tablet Take 1 tablet (88 mcg total) by mouth daily before breakfast.  30 tablet 0   magnesium oxide (MAG-OX) 400 MG tablet Take 1 tablet (400 mg total) by mouth daily. 90 tablet 1   mirabegron ER (MYRBETRIQ) 50 MG TB24 tablet Take 50 mg by mouth daily.     omeprazole (PRILOSEC) 20 MG capsule Take 20 mg by mouth daily.      potassium chloride (K-DUR) 10 MEQ tablet Take 2 tablets (20 mEq total) by mouth 2 (two) times daily. 360 tablet 3   rOPINIRole (REQUIP) 0.25 MG tablet Take 0.5 mg by mouth at bedtime.     valsartan (DIOVAN) 80 MG tablet TAKE 1/2 TABLET EVERY MORNING AND TAKE ADDITIONAL ONE-HALF TABLET IN THE AFTERNOON FOR BLOOD PRESSURE > 140/80 90 tablet 0   Vitamin D, Ergocalciferol, (DRISDOL) 50000 UNITS CAPS Take 50,000 Units by mouth 2 (two) times a week. Takes on Tuesdays and Fridays     acetaminophen (TYLENOL) 500 MG tablet Take 500 mg by mouth every 6 (six) hours as needed for mild pain or headache.      diazepam (VALIUM) 5 MG tablet Take 1 by mouth 1 hour  pre-procedure with very light food. May bring 2nd tablet to appointment. (Patient not taking: Reported on 12/11/2018) 2 tablet 0   NITROSTAT 0.4 MG SL tablet ONE TABLET UNDER TONGUE AS NEEDED FOR CHEST PAIN AS DIRECTED (Patient not taking: No sig reported) 25 tablet 6   No current facility-administered medications for this encounter.     Allergies  Allergen Reactions   Oxycodone Other (See Comments)    Hallucinations    Tramadol Other (See Comments)    Hallucinations   Sulfa Antibiotics Itching    Vaginal itching   Sulfacetamide Sodium Itching    Vaginal itching    Social History   Socioeconomic History   Marital status: Widowed    Spouse name: Not on file   Number of children: Not on file   Years of education: Not on file   Highest education level: Not on file    Occupational History   Occupation: Retired  Scientist, product/process development strain: Not on file   Food insecurity    Worry: Not on file    Inability: Not on Lexicographer needs    Medical: Not on file    Non-medical: Not on file  Tobacco Use   Smoking status: Passive Smoke Exposure - Never Smoker   Smokeless tobacco: Never Used   Tobacco comment: "husband passed in 2010; father passed 1978"  Substance and Sexual Activity   Alcohol use: Yes    Alcohol/week: 0.0 standard drinks    Comment: glass of wine occasionally    Drug use: No   Sexual activity: Not Currently    Birth control/protection: Post-menopausal  Lifestyle   Physical activity    Days per week: Not on file    Minutes per session: Not on file   Stress: Not on file  Relationships   Social connections    Talks on phone: Not on file    Gets together: Not on file    Attends religious service: Not on file    Active member of club or organization: Not on file    Attends meetings of clubs or organizations: Not on file    Relationship status: Not on file   Intimate partner violence    Fear of current or ex partner: Not on file    Emotionally abused: Not on file    Physically abused: Not on file    Forced  sexual activity: Not on file  Other Topics Concern   Not on file  Social History Narrative   Not on file    Family History  Problem Relation Age of Onset   Heart attack Father    Hypertension Mother    Stroke Mother    Hypertension Maternal Grandfather    Colon cancer Neg Hx     ROS- All systems are reviewed and negative except as per the HPI above  Physical Exam: Vitals:   12/11/18 0916  BP: 140/72  Pulse: 70  Weight: 97.3 kg  Height: 5' 4.5" (1.638 m)   Wt Readings from Last 3 Encounters:  12/11/18 97.3 kg  11/26/18 96.2 kg  11/20/18 97.1 kg    Labs: Lab Results  Component Value Date   NA 141 11/20/2018   K 3.9 11/20/2018   CL 99 11/20/2018   CO2 25  11/20/2018   GLUCOSE 169 (H) 11/20/2018   BUN 16 11/20/2018   CREATININE 1.03 (H) 11/20/2018   CALCIUM 9.2 11/20/2018   PHOS 4.0 03/25/2018   MG 1.8 11/20/2018   Lab Results  Component Value Date   INR 1.27 03/15/2018   Lab Results  Component Value Date   CHOL 115 07/16/2018   HDL 53 07/16/2018   LDLCALC 45 07/16/2018   TRIG 87 07/16/2018     GEN- The patient is well appearing, alert and oriented x 3 today.   Head- normocephalic, atraumatic Eyes-  Sclera clear, conjunctiva pink Ears- hearing intact Oropharynx- clear Neck- supple, no JVP Lymph- no cervical lymphadenopathy Lungs- Clear to ausculation bilaterally, normal work of breathing Heart- regular rate and rhythm, no murmurs, rubs or gallops, PMI not laterally displaced GI- soft, NT, ND, + BS Extremities- no clubbing, cyanosis, or edema MS- no significant deformity or atrophy Skin- no rash or lesion Psych- euthymic mood, full affect Neuro- strength and sensation are intact  EKG- SR with first  degree AV block, IRBBB, at 70 bpm, pr int 256 ms, qrs int 110 ms, qtc 464 ms    Assessment and Plan: 1. Paroxysmal afib Failed sotalol and tikosyn Now SR  s/p amiodarone loading and DCCV Reduce amiodarone to 200 mg daily Zio patch for one week to assess palpitations  2. Sleep apnea Now wearing cpap and feels improved  3. CAD No current anginal issues Per [name]   F/u with [name] as scheduled 10/4  Geroge Baseman. [facility], [facility] [address] [place], [place] [address] [phone]

## 2018-12-11 NOTE — Patient Instructions (Signed)
Decrease amiodarone to 200mg  once a day  Mail in zio patch in 7 days

## 2018-12-12 ENCOUNTER — Telehealth: Payer: Self-pay | Admitting: Internal Medicine

## 2018-12-12 ENCOUNTER — Ambulatory Visit (HOSPITAL_COMMUNITY): Payer: Medicare Other | Admitting: Nurse Practitioner

## 2018-12-12 NOTE — Telephone Encounter (Signed)
New Message:     Patient would like for [name] to call her. She said she wanted to discuss somethings with her and she can talk to Dr Lovena Le about it.

## 2018-12-12 NOTE — Telephone Encounter (Signed)
Returned call to Pt.  Per Pt Dr. Ellene Route has sent a letter requesting clearance for Pt to have a back injection.  No clearance noted in Pt's chart.  Advised would look for it on Monday and if we have not received it this nurse would contact Dr. Clarice Pole office to fax request.  Pt thanked nurse for call.

## 2018-12-17 NOTE — Telephone Encounter (Signed)
Call placed to Pt.  Advised had not received letter from Dr. Ellene Route.  Will make outreach to Dr. Ellene Route office to request letter be refaxed to office.  Outreach made to Dr. Ellene Route scheduler at [phone].  Scheduler to refax surgical clearance request.

## 2018-12-17 NOTE — Telephone Encounter (Signed)
New Message    Patient states she's waiting on your call.  Please call patient back.

## 2018-12-18 ENCOUNTER — Ambulatory Visit (HOSPITAL_COMMUNITY): Payer: Medicare Other | Admitting: Nurse Practitioner

## 2018-12-18 NOTE — Telephone Encounter (Signed)
Fax sent to Dr. Ellene Route office with approval for proceeding with back injections.

## 2018-12-20 ENCOUNTER — Other Ambulatory Visit: Payer: Self-pay | Admitting: Interventional Cardiology

## 2018-12-27 ENCOUNTER — Encounter (HOSPITAL_BASED_OUTPATIENT_CLINIC_OR_DEPARTMENT_OTHER): Payer: Medicare Other

## 2019-01-06 ENCOUNTER — Telehealth: Payer: Self-pay | Admitting: *Deleted

## 2019-01-06 NOTE — Telephone Encounter (Signed)

## 2019-01-06 NOTE — Progress Notes (Signed)
Cardiology Office Note:    Date:  01/07/2019   ID:  [name], DOB 01/28/1941, MRN [id number]  PCP:  Abigail Infante, MD  Cardiologist:  Sinclair Grooms, MD   Referring MD: Abigail Infante, MD   Chief Complaint  Patient presents with  . Coronary Artery Disease  . Shortness of Breath    History of Present Illness:    [name] is a 78 y.o. female with a hx of paroxysmal atrial fibrillation with multiple cardioversions, hypertension, )SA, CAD with CABG 2013, and hyperlipidemia.  [name] is doing so much better than on her last visit when she was short of breath and weak.  She has subsequently undergone conversion to sinus rhythm.  Amiodarone is holding her heart and rhythm.  In sinus rhythm diastolic heart failure is much better controlled.  She denies angina.  She has not had palpitations or syncope.  No obvious side effects to the amiodarone.  Past Medical History:  Diagnosis Date  . Addison's disease ([place])   . Anxiety   . Arthritis    "everywhere"  . Atrial fibrillation (H[facility])    Eliquis  . Chronic bronchitis ([place])    "although I didn't get it this year" (07/23/2017)  . 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  last knee surgery dr. Elmyra Ricks gave large dose of hydrocortisal and avoided the hypotensive side effectas of addison's disease.  . Coronary artery disease   . Depression   . GERD (gastroesophageal reflux disease)   . Hyperlipemia   . Hypertension   . Hypothyroidism   . Lumbar radiculopathy 04/27/2014  . Migraine    "in my 20's" (07/23/2017)  . Recurrent upper respiratory infection (URI)   . UTI (lower urinary tract infection) 07/09/2011    Past Surgical History:  Procedure Laterality Date  . ACHILLES TENDON SURGERY Left 07/16/2014   bone spur removed  . ANTERIOR LUMBAR FUSION  1980   L4-5  . BACK SURGERY    . CARDIAC CATHETERIZATION  2013  . CARDIAC CATHETERIZATION N/A 09/02/2014   Procedure: Left Heart  Cath and Coronary Angiography;  Surgeon: Wellington Hampshire, MD;  Location: [facility];  Service: Cardiovascular;  Laterality: N/A;  . CARDIOVERSION N/A 10/22/2015   Procedure: CARDIOVERSION;  Surgeon: Sanda Klein, MD;  Location: [facility];  Service: Cardiovascular;  Laterality: N/A;  . CARDIOVERSION N/A 06/07/2017   Procedure: CARDIOVERSION;  Surgeon: Larey Dresser, MD;  Location: [facility] ENDOSCOPY;  Service: Cardiovascular;  Laterality: N/A;  . CARDIOVERSION N/A 07/05/2017   Procedure: CARDIOVERSION;  Surgeon: Pixie Casino, MD;  Location: [facility] ENDOSCOPY;  Service: Cardiovascular;  Laterality: N/A;  . CARDIOVERSION N/A 09/30/2018   Procedure: CARDIOVERSION;  Surgeon: Buford Dresser, MD;  Location: [facility] ENDOSCOPY;  Service: Cardiovascular;  Laterality: N/A;  . CARDIOVERSION N/A 11/26/2018   Procedure: CARDIOVERSION;  Surgeon: Sanda Klein, MD;  Location: [facility] ENDOSCOPY;  Service: Cardiovascular;  Laterality: N/A;  . CARPAL TUNNEL RELEASE Right   . CATARACT EXTRACTION, BILATERAL Bilateral   . COLON RESECTION N/A 03/20/2018   Procedure: LAPAROSCOPIC SIGMOIDECTOMY WITH COLOSTOMY;  Surgeon: Ileana Roup, MD;  Location: [facility] ORS;  Service: General;  Laterality: N/A;  . COLON SURGERY     laparascopic vs. open sigmoidectomy with cystoscopy  Dr. Dema Severin and Dr. Gloriann Loan 03-20-18  . COLONOSCOPY WITH PROPOFOL N/A 01/17/2018   Procedure: COLONOSCOPY WITH PROPOFOL;  Surgeon: Milus Banister, MD;  Location: [facility] ENDOSCOPY;  Service: Endoscopy;  Laterality:  N/A;  . CORONARY ARTERY BYPASS GRAFT  06/26/2011   Procedure: CORONARY ARTERY BYPASS GRAFTING (CABG);  Surgeon: Melrose Nakayama, MD;  Location: [place];  Service: Open Heart Surgery;  Laterality: N/A;  times using Greater Saphenous Vein Graft harvested endoscopically from right leg  . CYSTOSCOPY W/ URETERAL STENT PLACEMENT Bilateral 03/20/2018   Procedure: CYSTOSCOPY WITH BILATERAL RETROGRADE AND BILATERAL URETERAL CATHETER PLACEMENT;   Surgeon: Lucas Mallow, MD;  Location: [facility];  Service: Urology;  Laterality: Bilateral;  . DIAGNOSTIC LAPAROSCOPY    . DILATION AND CURETTAGE OF UTERUS    . JOINT REPLACEMENT    . KNEE ARTHROSCOPY Bilateral   . LAPAROSCOPIC CHOLECYSTECTOMY    . POLYPECTOMY  01/17/2018   Procedure: POLYPECTOMY;  Surgeon: Milus Banister, MD;  Location: [facility];  Service: Endoscopy;;  . REVISION TOTAL KNEE ARTHROPLASTY Right   . SPINAL CORD STIMULATOR IMPLANT  12/10/15  . TAKE DOWN OF INTESTINAL FISTULA N/A 03/20/2018   Procedure: LAPAROSCOPIC TAKE DOWN OF COLOVESICAL FISTULA;  Surgeon: Ileana Roup, MD;  Location: [facility];  Service: General;  Laterality: N/A;  . TONSILLECTOMY    . TOTAL KNEE ARTHROPLASTY Bilateral     Current Medications: Current Meds  Medication Sig  . acetaminophen (TYLENOL) 500 MG tablet Take 500 mg by mouth every 6 (six) hours as needed for mild pain or headache.   [name] amiodarone (PACERONE) 200 MG tablet Take 1 tablet (200 mg total) by mouth daily.  [name] aspirin EC 81 MG tablet Take 81 mg by mouth every Monday, Wednesday, and Friday. In the morning.  . clonazePAM (KLONOPIN) 0.5 MG tablet Take 0.25-0.5 mg by mouth 2 (two) times daily as needed for anxiety.  . Cyanocobalamin 1000 MCG/ML KIT Inject 1,000 mcg into the muscle every 30 (thirty) days. Take one (1) injection as directed every month.  . diltiazem (CARDIZEM CD) 180 MG 24 hr capsule Take 1 capsule (180 mg total) by mouth daily.  [name] ELIQUIS 5 MG TABS tablet TAKE ONE TABLET TWICE DAILY  . Evolocumab 140 MG/ML SOAJ Inject 140 mg into the skin every 14 (fourteen) days.  [name] FLUoxetine (PROZAC) 10 MG tablet Take 10 mg by mouth daily.   . furosemide (LASIX) 40 MG tablet Take 1 tablet (40 mg total) by mouth daily.  . hydrocortisone (CORTEF) 20 MG tablet Take 20 mg by mouth 2 (two) times daily.  [name] levothyroxine (SYNTHROID, LEVOTHROID) 88 MCG tablet Take 1 tablet (88 mcg total) by mouth daily before breakfast.  . magnesium  oxide (MAG-OX) 400 MG tablet Take 1 tablet (400 mg total) by mouth daily.  . mirabegron ER (MYRBETRIQ) 50 MG TB24 tablet Take 50 mg by mouth daily.  [name] NITROSTAT 0.4 MG SL tablet ONE TABLET UNDER TONGUE AS NEEDED FOR CHEST PAIN AS DIRECTED  . omeprazole (PRILOSEC) 20 MG capsule Take 20 mg by mouth daily.   . potassium chloride (K-DUR) 10 MEQ tablet Take 2 tablets (20 mEq total) by mouth 2 (two) times daily.  [name] rOPINIRole (REQUIP) 0.25 MG tablet Take 0.5 mg by mouth at bedtime.  . valsartan (DIOVAN) 80 MG tablet TAKE 1/2 TABLET EVERY MORNING AND TAKE ADDITIONAL ONE-HALF TABLET IN THE AFTERNOON FOR BLOOD PRESSURE > 140/80  . Vitamin D, Ergocalciferol, (DRISDOL) 50000 UNITS CAPS Take 50,000 Units by mouth 2 (two) times a week. Takes on Tuesdays and Fridays     Allergies:   Oxycodone, Tramadol, Sulfa antibiotics, and Sulfacetamide sodium   Social History   Socioeconomic History  .  Marital status: Widowed    Spouse name: Not on file  . Number of children: Not on file  . Years of education: Not on file  . Highest education level: Not on file  Occupational History  . Occupation: Retired  Scientific laboratory technician  . Financial resource strain: Not on file  . Food insecurity    Worry: Not on file    Inability: Not on file  . Transportation needs    Medical: Not on file    Non-medical: Not on file  Tobacco Use  . Smoking status: Passive Smoke Exposure - Never Smoker  . Smokeless tobacco: Never Used  . Tobacco comment: "husband passed in 2010; father passed 1978"  Substance and Sexual Activity  . Alcohol use: Yes    Alcohol/week: 0.0 standard drinks    Comment: glass of wine occasionally   . Drug use: No  . Sexual activity: Not Currently    Birth control/protection: Post-menopausal  Lifestyle  . Physical activity    Days per week: Not on file    Minutes per session: Not on file  . Stress: Not on file  Relationships  . Social Herbalist on phone: Not on file    Gets together: Not on  file    Attends religious service: Not on file    Active member of club or organization: Not on file    Attends meetings of clubs or organizations: Not on file    Relationship status: Not on file  Other Topics Concern  . Not on file  Social History Narrative  . Not on file     Family History: The patient's family history includes Heart attack in her father; Hypertension in her maternal grandfather and mother; Stroke in her mother. There is no history of Colon cancer.  ROS:   Please see the history of present illness.   She has chronic lower back discomfort.  No has help at home and this is been very helpful.  Within the past week she had a lumbar injection by Dr. Ellene Route.  All other systems reviewed and are negative.  EKGs/Labs/Other Studies Reviewed:    The following studies were reviewed today: No new imaging.  EKG:  EKG not repeated  Recent Labs: 09/26/2018: NT-Pro BNP 848 11/20/2018: ALT 16; BUN 16; Creatinine, Ser 1.03; Hemoglobin 13.3; Magnesium 1.8; Platelets 321; Potassium 3.9; Sodium 141; TSH 2.710  Recent Lipid Panel    Component Value Date/Time   CHOL 115 07/16/2018 1023   TRIG 87 07/16/2018 1023   HDL 53 07/16/2018 1023   CHOLHDL 2.2 07/16/2018 1023   CHOLHDL 2.5 09/02/2014 0346   VLDL 16 09/02/2014 0346   LDLCALC 45 07/16/2018 1023   LDLDIRECT 135 (H) 02/04/2018 1555    Physical Exam:    VS:  BP 138/72   Pulse 76   Ht 5' 4.5" (1.638 m)   Wt 215 lb 9.6 oz (97.8 kg)   SpO2 96%   BMI 36.44 kg/m     Wt Readings from Last 3 Encounters:  01/07/19 215 lb 9.6 oz (97.8 kg)  12/11/18 214 lb 9.6 oz (97.3 kg)  11/26/18 212 lb (96.2 kg)     GEN: Obese.. No acute distress HEENT: Normal NECK: No JVD. LYMPHATICS: No lymphadenopathy CARDIAC:  RRR without murmur, gallop, or edema. VASCULAR:  Normal Pulses. No bruits. RESPIRATORY:  Clear to auscultation without rales, wheezing or rhonchi  ABDOMEN: Soft, non-tender, non-distended, No pulsatile  mass, MUSCULOSKELETAL: No deformity  SKIN: Warm and dry  NEUROLOGIC:  Alert and oriented x 3 PSYCHIATRIC:  Normal affect   ASSESSMENT:    1. Essential hypertension   2. Coronary artery disease involving coronary bypass graft of native heart with angina pectoris ([place])   3. Hyperlipidemia, unspecified hyperlipidemia type   4. Chronic anticoagulation   5. 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 Snoring   7. Educated about COVID-19 virus infection    PLAN:    In order of problems listed above:  1. Blood pressure is adequately controlled.  There is some upward creep in the systolic that we will keep our eye on.  I advocated low-salt diet. 2. Secondary prevention discussed 3. LDL target 70 or less.  Currently using evolocumab with LDL of 80 in May.  I would really love to have the LDL near 50. 4. Anticoagulation with Eliquis without complications. 5. Secondary prevention 6. CPAP advocated 7. The 3W as were discussed and endorsed by the patient.  Overall education and awareness concerning primary/secondary risk prevention was discussed in detail: LDL less than 70, hemoglobin A1c less than 7, blood pressure target less than 130/80 mmHg, >150 minutes of moderate aerobic activity per week, avoidance of smoking, weight control (via diet and exercise), and continued surveillance/management of/for obstructive sleep apnea.    Medication Adjustments/Labs and Tests Ordered: Current medicines are reviewed at length with the patient today.  Concerns regarding medicines are outlined above.  No orders of the defined types were placed in this encounter.  No orders of the defined types were placed in this encounter.   There are no Patient Instructions on file for this visit.   Signed, Sinclair Grooms, MD  01/07/2019 11:48 AM    [place]

## 2019-01-07 ENCOUNTER — Other Ambulatory Visit: Payer: Self-pay

## 2019-01-07 ENCOUNTER — Encounter: Payer: Self-pay | Admitting: Interventional Cardiology

## 2019-01-07 ENCOUNTER — Ambulatory Visit: Payer: Medicare Other | Admitting: Interventional Cardiology

## 2019-01-07 VITALS — BP 138/72 | HR 76 | Ht 64.5 in | Wt 215.6 lb

## 2019-01-07 DIAGNOSIS — Z7189 Other specified counseling: Secondary | ICD-10-CM

## 2019-01-07 DIAGNOSIS — I25709 Atherosclerosis of coronary artery bypass graft(s), unspecified, with unspecified angina pectoris: Secondary | ICD-10-CM

## 2019-01-07 DIAGNOSIS — E785 Hyperlipidemia, unspecified: Secondary | ICD-10-CM

## 2019-01-07 DIAGNOSIS — K6389 Other specified diseases of intestine: Secondary | ICD-10-CM

## 2019-01-07 DIAGNOSIS — Z7901 Long term (current) use of anticoagulants: Secondary | ICD-10-CM

## 2019-01-07 DIAGNOSIS — I10 Essential (primary) hypertension: Secondary | ICD-10-CM | POA: Diagnosis not present

## 2019-01-07 DIAGNOSIS — R0683 Snoring: Secondary | ICD-10-CM

## 2019-01-07 MED ORDER — AMIODARONE HCL 200 MG PO TABS: 200.0000 mg | ORAL_TABLET | Freq: Every day | ORAL | 3 refills | Status: AC

## 2019-01-07 NOTE — Patient Instructions (Signed)
Medication Instructions:  Your physician recommends that you continue on your current medications as directed. Please refer to the Current Medication list given to you today.  If you need a refill on your cardiac medications before your next appointment, please call your pharmacy.   Lab work: None If you have labs (blood work) drawn today and your tests are completely normal, you will receive your results only by: . MyChart Message (if you have MyChart) OR . A paper copy in the mail If you have any lab test that is abnormal or we need to change your treatment, we will call you to review the results.  Testing/Procedures: None  Follow-Up: At [facility], you and your health needs are our priority.  As part of our continuing mission to provide you with exceptional heart care, we have created designated Provider Care Teams.  These Care Teams include your primary Cardiologist (physician) and Advanced Practice Providers (APPs -  Physician Assistants and Nurse Practitioners) who all work together to provide you with the care you need, when you need it. You will need a follow up appointment in 9-12 months.  Please call our office 2 months in advance to schedule this appointment.  You may see Henry W Smith III, MD or one of the following Advanced Practice Providers on your designated Care Team:   Lori Gerhardt, NP Laura Ingold, NP . Jill McDaniel, NP  Any Other Special Instructions Will Be Listed Below (If Applicable).    

## 2019-01-08 ENCOUNTER — Telehealth: Payer: Self-pay | Admitting: *Deleted

## 2019-01-08 NOTE — Telephone Encounter (Signed)
consented and meds/allergies reviewed     Virtual Visit Pre-Appointment Phone Call  , I am calling you today to discuss your upcoming appointment. We are currently trying to limit exposure to the virus that causes COVID-19 by seeing patients at home rather than in the office."  Confirm consent - "In the setting of the current Covid19 crisis, you are scheduled for a (phone or video) visit with your provider on (date) at (time).  Just as we do with many in-office visits, in order for you to participate in this visit, we must obtain consent.  If you'd like, I can send this to your mychart (if signed up) or email for you to review.  Otherwise, I can obtain your verbal consent now.  All virtual visits are billed to your insurance company just like a normal visit would be.  By agreeing to a virtual visit, we'd like you to understand that the technology does not allow for your provider to perform an examination, and thus may limit your provider's ability to fully assess your condition. If your provider identifies any concerns that need to be evaluated in person, we will make arrangements to do so.  Finally, though the technology is pretty good, we cannot assure that it will always work on either your or our end, and in the setting of a video visit, we may have to convert it to a phone-only visit.  In either situation, we cannot ensure that we have a secure connection.  Are you willing to proceed?" STAFF: Did the patient verbally acknowledge consent to telehealth visit? Document YES/NO here: YES  TELEPHONE CALL NOTE  [name] has been deemed a candidate for a follow-up tele-health visit to limit community exposure during the Covid-19 pandemic. I spoke with the patient via phone to ensure availability of phone/video source, confirm preferred email & phone number, and discuss instructions and expectations.  I reminded [name] to be prepared with any vital sign and/or heart rhythm information that  could potentially be obtained via home monitoring, at the time of her visit. I reminded [name] to expect a phone call prior to her visit.  Claude Manges, [place] 01/08/2019 2:57 PM     FULL LENGTH CONSENT FOR TELE-HEALTH VISIT   I hereby voluntarily request, consent and authorize [facility] and its employed or contracted physicians, Engineer, materials, nurse practitioners or other licensed health care professionals (the Practitioner), to provide me with telemedicine health care services (the Services") as deemed necessary by the treating Practitioner. I acknowledge and consent to receive the Services by the Practitioner via telemedicine. I understand that the telemedicine visit will involve communicating with the Practitioner through live audiovisual communication technology and the disclosure of certain medical information by electronic transmission. I acknowledge that I have been given the opportunity to request an in-person assessment or other available alternative prior to the telemedicine visit and am voluntarily participating in the telemedicine visit.  I understand that I have the right to withhold or withdraw my consent to the use of telemedicine in the course of my care at any time, without affecting my right to future care or treatment, and that the Practitioner or I may terminate the telemedicine visit at any time. I understand that I have the right to inspect all information obtained and/or recorded in the course of the telemedicine visit and may receive copies of available information for a reasonable fee.  I understand that some of the potential risks of receiving the Services  via telemedicine include:   Delay or interruption in medical evaluation due to technological equipment failure or disruption;  Information transmitted may not be sufficient (e.g. poor resolution of images) to allow for appropriate medical decision making by the Practitioner; and/or   In rare  instances, security protocols could fail, causing a breach of personal health information.  Furthermore, I acknowledge that it is my responsibility to provide information about my medical history, conditions and care that is complete and accurate to the best of my ability. I acknowledge that Practitioner's advice, recommendations, and/or decision may be based on factors not within their control, such as incomplete or inaccurate data provided by me or distortions of diagnostic images or specimens that may result from electronic transmissions. I understand that the practice of medicine is not an exact science and that Practitioner makes no warranties or guarantees regarding treatment outcomes. I acknowledge that I will receive a copy of this consent concurrently upon execution via email to the email address I last provided but may also request a printed copy by calling the office of [facility].    I understand that my insurance will be billed for this visit.   I have read or had this consent read to me.  I understand the contents of this consent, which adequately explains the benefits and risks of the Services being provided via telemedicine.   I have been provided ample opportunity to ask questions regarding this consent and the Services and have had my questions answered to my satisfaction.  I give my informed consent for the services to be provided through the use of telemedicine in my medical care  By participating in this telemedicine visit I agree to the above.

## 2019-01-09 ENCOUNTER — Encounter: Payer: Self-pay | Admitting: Physical Medicine and Rehabilitation

## 2019-01-09 DIAGNOSIS — Z9689 Presence of other specified functional implants: Secondary | ICD-10-CM | POA: Insufficient documentation

## 2019-01-09 DIAGNOSIS — M961 Postlaminectomy syndrome, not elsewhere classified: Secondary | ICD-10-CM | POA: Insufficient documentation

## 2019-01-09 DIAGNOSIS — M47816 Spondylosis without myelopathy or radiculopathy, lumbar region: Secondary | ICD-10-CM | POA: Insufficient documentation

## 2019-01-09 NOTE — Progress Notes (Signed)
LITZI KEZIAH - 78 y.o. female MRN [id number]  Date of birth: September 04, 1940  Office Visit Note: Visit Date: 10/09/2018 PCP: Crist Infante, MD Referred by: Crist Infante, MD  Subjective: Chief Complaint  Patient presents with   Lower Back - Pain   HPI: TALITHIA ERCOLE is a 78 y.o. female who comes in today As a new patient consultation for evaluation management of chronic worsening low back pain.  Patient reports a many year history of back pain which is been severe for a long time.  She points to pain really over the upper to mid lumbar region.  She denies any referral pain down the legs.  She denies any paresthesia.  She is having axial low back pain not relieved with medication management and activity modification and history of remote anterior lumbar fusion at L4-5 in 1980 and status post Medtronic spinal cord stimulator.  Patient reports that the spinal cord stimulator is useful and helps but is not helping at this point because she is having on average 10 out of 10 pain.  Patient had been seeing Dr. Niel Hummer with decent relief with injection management.  Interestingly the patient's belief system has it that she obtained significant relief of her low back pain with the last injection that was performed which was bilateral thoracic facet joint block using betamethasone or Celestone.  We did have multiple pages of notes that I did review from Dr. Niel Hummer.  In the past the patient had had lower injections above the fusion.  She has had fairly recent lumbar spine MRI and is seem to have these updated every couple of years.  MRI is reviewed with the patient with images and spine model and is reviewed in the report below.  Basically she has lumbar facet arthropathy at L2-3 and L3-4 above surgery level.  She has mild to moderate narrowing at these levels centrally.  L5 is a transitional segment based on the numbering scheme.  I did obtain x-rays today just to look at placement of stimulator as  well as an overall view of her lumbar spine and that is dictated below.  Patient has not really tolerated medication management to any degree.  She is unable to take opioid medications.  Review of Systems  Constitutional: Negative for chills, fever, malaise/fatigue and weight loss.  HENT: Negative for hearing loss and sinus pain.   Eyes: Negative for blurred vision, double vision and photophobia.  Respiratory: Negative for cough and shortness of breath.   Cardiovascular: Negative for chest pain, palpitations and leg swelling.  Gastrointestinal: Negative for abdominal pain, nausea and vomiting.  Genitourinary: Negative for flank pain.  Musculoskeletal: Positive for back pain. Negative for myalgias.  Skin: Negative for itching and rash.  Neurological: Negative for tremors, focal weakness and weakness.  Endo/Heme/Allergies: Negative.   Psychiatric/Behavioral: Negative for depression.  All other systems reviewed and are negative.  Otherwise per HPI.  Assessment & Plan: Visit Diagnoses:  1. Chronic bilateral low back pain without sciatica   2. Pain in thoracic spine   3. Post laminectomy syndrome   4. Spondylosis without myelopathy or radiculopathy, lumbar region   5. Myofascial pain syndrome   6. S/P insertion of spinal cord stimulator     Plan: Findings:  Chronic multiyear history of back pain both around the second mid back pain and low back pain treated historically by Dr. Niel Hummer who does not take insurance at this point and has had patients referred to other physicians for treatment.  The  patient was very happy with injection therapy and it was helping at times.  Last injection however was a thoracic injection that the patient swears helped her pain a great deal today her pain is really lower to upper lumbar pain predominantly.  This does fit with x-ray imaging and lumbar spine MRI which was fairly recent.  He does have Medtronic spinal cord stimulator and she feels like this does  help a great deal but she rates her pain as a 10 out of 10.  She has difficulty with movement and doing activities of daily living.  Her pain is real constant and sharp and really does not tolerate medications at all.  I think the best approach really is diagnostic medial branch blocks and facet blocks above the surgical L4-5 level.  If she gets good relief for that we would look at double block paradigm and radiofrequency ablation.  If she gets good long-term relief with that we could repeat those intermittently.  If she does not get much relief would look at at least a one-time thoracic injection just to see if it helps her.  She should continue with home exercises and strengthening.  Could regroup with physical therapist although she has had this on numerous occasions.    Meds & Orders:  Meds ordered this encounter  Medications   DISCONTD: diazepam (VALIUM) 5 MG tablet    Sig: Take 1 by mouth 1 hour  pre-procedure with very light food. May bring 2nd tablet to appointment.    Dispense:  2 tablet    Refill:  0    Orders Placed This Encounter  Procedures   XR Lumbar Spine 2-3 Views   XR Thoracic Spine 2 View    Follow-up: Return for Bilateral facet joint blocks L2-3 and L3-4.   Procedures: No procedures performed  No notes on file   Clinical History: MRI LUMBAR SPINE WITHOUT AND WITH CONTRAST  TECHNIQUE: Multiplanar and multiecho pulse sequences of the lumbar spine were obtained without and with intravenous contrast.  CONTRAST:  9mL MULTIHANCE GADOBENATE DIMEGLUMINE 529 MG/ML IV SOLN  COMPARISON:  08/02/2015  FINDINGS: Segmentation: Transitional anatomy with the same numbering scheme as on prior was used.  Alignment:  Straightening without listhesis  Vertebrae: No fracture, evidence of discitis, or bone lesion. Intervertebral cage at L4-5. The cage is incorporated into the L4 inferior endplate but not clearly into the L5 superior endplate where there is prominent  subsidence.  Conus medullaris and cauda equina: Conus extends to the L1 level. Conus and cauda equina appear normal.  Paraspinal and other soft tissues: Negative  Disc levels:  T12- L1: Unremarkable.  L1-L2: Disc narrowing and bulging with ventral thecal sac flattening. Mild facet spurring. No impingement  L2-L3: Disc narrowing and bulging with posterior element hypertrophy. Stable mild to moderate spinal stenosis. Noncompressive foraminal narrowing bilaterally.  L3-L4: Posterior element hypertrophy. Mild disc narrowing and bulging. Mild to moderate spinal stenosis. Patent foramina  L4-L5: Postoperative disc space with narrowing and bulky left far-lateral spurring. Mild spinal and subarticular recess narrowing without compression or progression. Patent foramina  L5-S1:Incomplete segmentation at the transverse processes. No herniation or impingement.  IMPRESSION: 1. Disc and facet degeneration without progression from 2017. 2. Stable mild-to-moderate spinal stenosis at L2-3 and L3-4. 3. No compressive foraminal narrowing.   Electronically Signed   By: Monte Fantasia M.D.   On: 09/07/2017 11:53   She reports that she is a non-smoker but has been exposed to tobacco smoke. She has been exposed  to 0.00 packs per day for the past 0.00 years. She has never used smokeless tobacco.  Recent Labs    03/15/18 1213  HGBA1C 5.8*    Objective:  VS:  HT:5' 4.5" (163.8 cm)    WT:207 lb (93.9 kg)   BMI:35     BP:134/87   HR:85bpm   TEMP: ( )   RESP:  Physical Exam Vitals signs and nursing note reviewed.  Constitutional:      General: She is not in acute distress.    Appearance: Normal appearance. She is well-developed.  HENT:     Head: Normocephalic and atraumatic.     Nose: Nose normal.     Mouth/Throat:     Mouth: Mucous membranes are moist.     Pharynx: Oropharynx is clear.  Eyes:     Conjunctiva/sclera: Conjunctivae normal.     Pupils: Pupils are equal,  round, and reactive to light.  Neck:     Musculoskeletal: Normal range of motion and neck supple.  Cardiovascular:     Rate and Rhythm: Regular rhythm.  Pulmonary:     Effort: Pulmonary effort is normal. No respiratory distress.  Abdominal:     General: There is no distension.     Palpations: Abdomen is soft.     Tenderness: There is no guarding.  Musculoskeletal:     Right lower leg: No edema.     Left lower leg: No edema.     Comments: Patient stands with fair difficulty she is very stiff through the lumbar spine she does have pain with extension.  She has pain with taut bands and trigger points in the thoracic spine paraspinally down to the lower spine.  She has no pain over the PSIS no pain over the greater trochanters and she has good motion of the hips bilaterally.  She has good distal strength.  Skin:    General: Skin is warm and dry.     Findings: No erythema or rash.  Neurological:     General: No focal deficit present.     Mental Status: She is alert and oriented to person, place, and time.     Motor: No abnormal muscle tone.     Coordination: Coordination normal.     Gait: Gait normal.  Psychiatric:        Mood and Affect: Mood normal.        Behavior: Behavior normal.        Thought Content: Thought content normal.     Ortho Exam Imaging: AP and lateral lumbar x-ray today:AP and lateral lumbar x-ray shows normal anatomic alignment with upper lumbar L1-2 and L2-3 degenerative disc height loss with facet arthropathy at L2-3 and L3-4.  There is prior surgery at L4-5 with transitional L5 segment using the numbering scheme of the MRI.  This appears to be lateral mass fusion there is no instrumentation.  We can see the RPG of the Medtronic stimulator in the picture.  Hip joints do not show a great deal of arthritis.  AP and lateral thoracic x-ray today:Surgically placed paddle lead at about the T7 spinous process midline is intact and there is fairly normal anatomic alignment  of the thoracic spine without vertebral compression fracture.  Some increased kyphosis in the upper thoracic region.  She does have anterior bridging osteophytes at about the T8-9 level.  Past Medical/Family/Surgical/Social History: Medications & Allergies reviewed per EMR, new medications updated. 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 12/11/2017   Orthostatic hypotension 09/24/2017   Visit for monitoring Tikosyn therapy 07/23/2017   Depression 06/15/2017   Acute respiratory failure with hypoxia (HCC)    Hypothyroidism    Bronchitis 03/22/2016   Abnormal CT of the chest 03/16/2016   Coronary artery disease involving coronary bypass graft of native heart with angina pectoris ([place]) 03/16/2016   Chronic anticoagulation 10/06/2015   On dofetilide therapy 10/06/2015   Atrial fibrillation ([place]) 09/15/2015   Snoring 02/17/2015   Chronic low back pain 11/09/2014   Disc degeneration, lumbar 11/09/2014   Addison disease ([place]) 09/30/2014   Chronic back pain 09/30/2014   Hematochezia-(Hgb stable) 09/03/2014   DOE (dyspnea on exertion) 09/01/2014   Lumbar radiculopathy 04/27/2014   Osteoarthritis 11/18/2008   Hyperlipidemia 11/17/2008   Essential hypertension 11/17/2008   Past Medical History:  Diagnosis Date   Addison's disease ([place])    Anxiety    Arthritis    "everywhere"   Atrial fibrillation ([place])    Eliquis     Chronic bronchitis ([place])    "although I didn't get it this year" ([id number])   Complication of anesthesia    addison's disease causes hypotension after any surgery .  last knee surgery dr. Elmyra Ricks gave large dose of hydrocortisal and avoided the hypotensive side effectas of addison's disease.   Coronary artery disease    Depression    GERD (gastroesophageal reflux disease)    Hyperlipemia    Hypertension    Hypothyroidism    Lumbar radiculopathy 04/27/2014   Migraine    "in my 20's" (07/23/2017)   Recurrent upper respiratory infection (URI)    UTI (lower urinary tract infection) 07/09/2011   Family History  Problem Relation Age of Onset   Heart attack Father    Hypertension Mother    Stroke Mother    Hypertension Maternal Grandfather    Colon cancer Neg Hx    Past Surgical History:  Procedure Laterality Date   ACHILLES TENDON SURGERY Left 07/16/2014   bone spur removed   ANTERIOR LUMBAR FUSION  1980   L4-5   BACK SURGERY     CARDIAC CATHETERIZATION  2013   CARDIAC CATHETERIZATION N/A 09/02/2014   Procedure: Left Heart Cath and Coronary Angiography;  Surgeon: Wellington Hampshire, MD;  Location: [facility];  Service: Cardiovascular;  Laterality: N/A;   CARDIOVERSION N/A 10/22/2015   Procedure: CARDIOVERSION;  Surgeon: Sanda Klein, MD;  Location: [facility];  Service: Cardiovascular;  Laterality: N/A;   CARDIOVERSION N/A 06/07/2017   Procedure: CARDIOVERSION;  Surgeon: Larey Dresser, MD;  Location: [facility];  Service: Cardiovascular;  Laterality: N/A;   CARDIOVERSION N/A 07/05/2017   Procedure: CARDIOVERSION;  Surgeon: Pixie Casino, MD;  Location: [facility]COPY;  Service: Cardiovascular;  Laterality: N/A;   CARDIOVERSION N/A 09/30/2018   Procedure: CARDIOVERSION;  Surgeon: Buford Dresser, MD;  Location: [facility] ENDOSCOPY;  Service: Cardiovascular;  Laterality: N/A;   CARDIOVERSION N/A 11/26/2018   Procedure: CARDIOVERSION;  Surgeon:  Sanda Klein, MD;  Location: [place];  Service: Cardiovascular;  Laterality: N/A;  CARPAL TUNNEL RELEASE Right    CATARACT EXTRACTION, BILATERAL Bilateral    COLON RESECTION N/A 03/20/2018   Procedure: LAPAROSCOPIC SIGMOIDECTOMY WITH COLOSTOMY;  Surgeon: Ileana Roup, MD;  Location: [facility];  Service: General;  Laterality: N/A;   COLON SURGERY     laparascopic vs. open sigmoidectomy with cystoscopy  Dr. Dema Severin and Dr. Gloriann Loan 03-20-18   COLONOSCOPY WITH PROPOFOL N/A 01/17/2018   Procedure: COLONOSCOPY WITH PROPOFOL;  Surgeon: Milus Banister, MD;  Location: [facility];  Service: Endoscopy;  Laterality: N/A;   CORONARY ARTERY BYPASS GRAFT  06/26/2011   Procedure: CORONARY ARTERY BYPASS GRAFTING (CABG);  Surgeon: Melrose Nakayama, MD;  Location: [facility];  Service: Open Heart Surgery;  Laterality: N/A;  times using Greater Saphenous Vein Graft harvested endoscopically from right leg   CYSTOSCOPY W/ URETERAL STENT PLACEMENT Bilateral 03/20/2018   Procedure: CYSTOSCOPY WITH BILATERAL RETROGRADE AND BILATERAL URETERAL CATHETER PLACEMENT;  Surgeon: Lucas Mallow, MD;  Location: [facility];  Service: Urology;  Laterality: Bilateral;   DIAGNOSTIC LAPAROSCOPY     DILATION AND CURETTAGE OF UTERUS     JOINT REPLACEMENT     KNEE ARTHROSCOPY Bilateral    LAPAROSCOPIC CHOLECYSTECTOMY     POLYPECTOMY  01/17/2018   Procedure: POLYPECTOMY;  Surgeon: Milus Banister, MD;  Location: [facility];  Service: Endoscopy;;   REVISION TOTAL KNEE ARTHROPLASTY Right    SPINAL CORD STIMULATOR IMPLANT  12/10/15   TAKE DOWN OF INTESTINAL FISTULA N/A 03/20/2018   Procedure: LAPAROSCOPIC TAKE DOWN OF COLOVESICAL FISTULA;  Surgeon: Ileana Roup, MD;  Location: [facility];  Service: General;  Laterality: N/A;   TONSILLECTOMY     TOTAL KNEE ARTHROPLASTY Bilateral    Social History   Occupational History   Occupation: Retired  Tobacco Use   Smoking status: Passive Smoke Exposure -  Never Smoker   Smokeless tobacco: Never Used   Tobacco comment: "husband passed in 2010; father passed 1978"  Substance and Sexual Activity   Alcohol use: Yes    Alcohol/week: 0.0 standard drinks    Comment: glass of wine occasionally    Drug use: No   Sexual activity: Not Currently    Birth control/protection: Post-menopausal

## 2019-01-12 NOTE — Progress Notes (Signed)
Virtual Visit via Video Note   This visit type was conducted due to national recommendations for restrictions regarding the COVID-19 Pandemic (e.g. social distancing) in an effort to limit this patient's exposure and mitigate transmission in our community.  Due to her co-morbid illnesses, this patient is at least at moderate risk for complications without adequate follow up.  This format is felt to be most appropriate for this patient at this time.  All issues noted in this document were discussed and addressed.  A limited physical exam was performed with this format.  Please refer to the patient's chart for her consent to telehealth for [facility].   Evaluation Performed:  Follow-up visit  This visit type was conducted due to national recommendations for restrictions regarding the COVID-19 Pandemic (e.g. social distancing).  This format is felt to be most appropriate for this patient at this time.  All issues noted in this document were discussed and addressed.  No physical exam was performed (except for noted visual exam findings with Video Visits).  Please refer to the patient's chart (MyChart message for video visits and phone note for telephone visits) for the patient's consent to telehealth for [facility].  Date:  01/13/2019   ID:  [name], DOB [birth year], MRN [id number]  Patient Location:  Home  Provider location:   [place]  PCP:  Crist Infante, MD  Cardiologist:  Sinclair Grooms, MD  Sleep Medicine:  Fransico Him, MD Electrophysiologist:  None   Chief Complaint:  OSA  History of Present Illness:    [name] is a 78 y.o. female who presents via audio/video conferencing for a telehealth visit today.    This is a 78yo female with a hx of HTN and CAD who was referred for sleep study due to excessive daytime sleepiness, witnessed apneas, afib and obesity.  She underwent PSG showing severe OSA with an AHI of 39/hr and O2 desats at low as 82%.  She was  placed on auto CPAP from 5 to 20cm H2O  She is doing well with her CPAP device and thinks that she has gotten used to it.  She tolerates the full face mask and feels the pressure is adequate.  Since going on CPAP she feels rested in the am and has no significant daytime sleepiness.  She was having  problems with significant mouth dryness which has improved with increasing the humidity..  She does not think that he snores.    The patient does not have symptoms concerning for COVID-19 infection (fever, chills, cough, or new shortness of breath).   Prior CV studies:   The following studies were reviewed today:  PAP compliance download  Past Medical History:  Diagnosis Date  . Addison's disease ([place])   . Anxiety   . Arthritis    "everywhere"  . Atrial fibrillation (HCC)    Eliquis  . Chronic bronchitis ([place])    "although I didn't get it this year" (07/23/2017)  . 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  last knee surgery dr. Elmyra Ricks gave large dose of hydrocortisal and avoided the hypotensive side effectas of addison's disease.  . Coronary artery disease   . Depression   . GERD (gastroesophageal reflux disease)   . Hyperlipemia   . Hypertension   . Hypothyroidism   . Lumbar radiculopathy 04/27/2014  . Migraine    "in my 20's" (07/23/2017)  . Recurrent upper respiratory infection (URI)   .  UTI (lower urinary tract infection) 07/09/2011   Past Surgical History:  Procedure Laterality Date  . ACHILLES TENDON SURGERY Left 07/16/2014   bone spur removed  . ANTERIOR LUMBAR FUSION  1980   L4-5  . BACK SURGERY    . CARDIAC CATHETERIZATION  2013  . CARDIAC CATHETERIZATION N/A 09/02/2014   Procedure: Left Heart Cath and Coronary Angiography;  Surgeon: Wellington Hampshire, MD;  Location: [place] [facility];  Service: Cardiovascular;  Laterality: N/A;  . CARDIOVERSION N/A 10/22/2015   Procedure: CARDIOVERSION;  Surgeon: Sanda Klein, MD;  Location:  [place] ENDOSCOPY;  Service: Cardiovascular;  Laterality: N/A;  . CARDIOVERSION N/A 06/07/2017   Procedure: CARDIOVERSION;  Surgeon: Larey Dresser, MD;  Location: [facility]COPY;  Service: Cardiovascular;  Laterality: N/A;  . CARDIOVERSION N/A 07/05/2017   Procedure: CARDIOVERSION;  Surgeon: Pixie Casino, MD;  Location: [facility] ENDOSCOPY;  Service: Cardiovascular;  Laterality: N/A;  . CARDIOVERSION N/A 09/30/2018   Procedure: CARDIOVERSION;  Surgeon: Buford Dresser, MD;  Location: [facility] ENDOSCOPY;  Service: Cardiovascular;  Laterality: N/A;  . CARDIOVERSION N/A 11/26/2018   Procedure: CARDIOVERSION;  Surgeon: Sanda Klein, MD;  Location: [facility] ENDOSCOPY;  Service: Cardiovascular;  Laterality: N/A;  . CARPAL TUNNEL RELEASE Right   . CATARACT EXTRACTION, BILATERAL Bilateral   . COLON RESECTION N/A 03/20/2018   Procedure: LAPAROSCOPIC SIGMOIDECTOMY WITH COLOSTOMY;  Surgeon: Ileana Roup, MD;  Location: [facility] ORS;  Service: General;  Laterality: N/A;  . COLON SURGERY     laparascopic vs. open sigmoidectomy with cystoscopy  Dr. Dema Severin and Dr. Gloriann Loan 03-20-18  . COLONOSCOPY WITH PROPOFOL N/A 01/17/2018   Procedure: COLONOSCOPY WITH PROPOFOL;  Surgeon: Milus Banister, MD;  Location: [facility] ENDOSCOPY;  Service: Endoscopy;  Laterality: N/A;  . CORONARY ARTERY BYPASS GRAFT  06/26/2011   Procedure: CORONARY ARTERY BYPASS GRAFTING (CABG);  Surgeon: Melrose Nakayama, MD;  Location: [facility];  Service: Open Heart Surgery;  Laterality: N/A;  times using Greater Saphenous Vein Graft harvested endoscopically from right leg  . CYSTOSCOPY W/ URETERAL STENT PLACEMENT Bilateral 03/20/2018   Procedure: CYSTOSCOPY WITH BILATERAL RETROGRADE AND BILATERAL URETERAL CATHETER PLACEMENT;  Surgeon: Lucas Mallow, MD;  Location: [facility] ORS;  Service: Urology;  Laterality: Bilateral;  . DIAGNOSTIC LAPAROSCOPY    . DILATION AND CURETTAGE OF UTERUS    . JOINT REPLACEMENT    . KNEE ARTHROSCOPY Bilateral   . LAPAROSCOPIC  CHOLECYSTECTOMY    . POLYPECTOMY  01/17/2018   Procedure: POLYPECTOMY;  Surgeon: Milus Banister, MD;  Location: [facility];  Service: Endoscopy;;  . REVISION TOTAL KNEE ARTHROPLASTY Right   . SPINAL CORD STIMULATOR IMPLANT  12/10/15  . TAKE DOWN OF INTESTINAL FISTULA N/A 03/20/2018   Procedure: LAPAROSCOPIC TAKE DOWN OF COLOVESICAL FISTULA;  Surgeon: Ileana Roup, MD;  Location: [facility] ORS;  Service: General;  Laterality: N/A;  . TONSILLECTOMY    . TOTAL KNEE ARTHROPLASTY Bilateral      Current Meds  Medication Sig  . acetaminophen (TYLENOL) 500 MG tablet Take 500 mg by mouth every 6 (six) hours as needed for mild pain or headache.   [name] amiodarone (PACERONE) 200 MG tablet Take 1 tablet (200 mg total) by mouth daily.  [name] aspirin EC 81 MG tablet Take 81 mg by mouth every Monday, Wednesday, and Friday. In the morning.  . clonazePAM (KLONOPIN) 0.5 MG tablet Take 0.25-0.5 mg by mouth 2 (two) times daily as needed for anxiety.  . Cyanocobalamin 1000 MCG/ML KIT Inject 1,000 mcg into the  muscle every 30 (thirty) days. Take one (1) injection as directed every month.  . diltiazem (CARDIZEM CD) 180 MG 24 hr capsule Take 1 capsule (180 mg total) by mouth daily.  [name] ELIQUIS 5 MG TABS tablet TAKE ONE TABLET TWICE DAILY  . Evolocumab 140 MG/ML SOAJ Inject 140 mg into the skin every 14 (fourteen) days.  [name] FLUoxetine (PROZAC) 10 MG tablet Take 10 mg by mouth daily.   . furosemide (LASIX) 40 MG tablet Take 1 tablet (40 mg total) by mouth daily.  . hydrocortisone (CORTEF) 20 MG tablet Take 20 mg by mouth 2 (two) times daily.  [name] levothyroxine (SYNTHROID, LEVOTHROID) 88 MCG tablet Take 1 tablet (88 mcg total) by mouth daily before breakfast.  . magnesium oxide (MAG-OX) 400 MG tablet Take 1 tablet (400 mg total) by mouth daily.  . mirabegron ER (MYRBETRIQ) 50 MG TB24 tablet Take 50 mg by mouth daily.  [name] NITROSTAT 0.4 MG SL tablet ONE TABLET UNDER TONGUE AS NEEDED FOR CHEST PAIN AS DIRECTED  . omeprazole  (PRILOSEC) 20 MG capsule Take 20 mg by mouth daily.   . potassium chloride (K-DUR) 10 MEQ tablet Take 2 tablets (20 mEq total) by mouth 2 (two) times daily.  [name] rOPINIRole (REQUIP) 0.25 MG tablet Take 0.5 mg by mouth at bedtime.  . valsartan (DIOVAN) 80 MG tablet TAKE 1/2 TABLET EVERY MORNING AND TAKE ADDITIONAL ONE-HALF TABLET IN THE AFTERNOON FOR BLOOD PRESSURE > 140/80  . Vitamin D, Ergocalciferol, (DRISDOL) 50000 UNITS CAPS Take 50,000 Units by mouth 2 (two) times a week. Takes on Tuesdays and Fridays     Allergies:   Oxycodone, Tramadol, Sulfa antibiotics, and Sulfacetamide sodium   Social History   Tobacco Use  . Smoking status: Passive Smoke Exposure - Never Smoker  . Smokeless tobacco: Never Used  . Tobacco comment: "husband passed in 2010; father passed 1978"  Substance Use Topics  . Alcohol use: Yes    Alcohol/week: 0.0 standard drinks    Comment: glass of wine occasionally   . Drug use: No     Family Hx: The patient's family history includes Heart attack in her father; Hypertension in her maternal grandfather and mother; Stroke in her mother. There is no history of Colon cancer.  ROS:   Please see the history of present illness.     All other systems reviewed and are negative.   Labs/Other Tests and Data Reviewed:    Recent Labs: 09/26/2018: NT-Pro BNP 848 11/20/2018: ALT 16; BUN 16; Creatinine, Ser 1.03; Hemoglobin 13.3; Magnesium 1.8; Platelets 321; Potassium 3.9; Sodium 141; TSH 2.710   Recent Lipid Panel Lab Results  Component Value Date/Time   CHOL 115 07/16/2018 10:23 AM   TRIG 87 07/16/2018 10:23 AM   HDL 53 07/16/2018 10:23 AM   CHOLHDL 2.2 07/16/2018 10:23 AM   CHOLHDL 2.5 09/02/2014 03:46 AM   LDLCALC 45 07/16/2018 10:23 AM   LDLDIRECT 135 (H) 02/04/2018 03:55 PM    Wt Readings from Last 3 Encounters:  01/13/19 212 lb (96.2 kg)  01/07/19 215 lb 9.6 oz (97.8 kg)  12/11/18 214 lb 9.6 oz (97.3 kg)     Objective:    Vital Signs:  BP (!) 157/66    Pulse 67   Ht 5' 4" (1.626 m)   Wt 212 lb (96.2 kg)   BMI 36.39 kg/m    CONSTITUTIONAL:  Well nourished, well developed female in no acute distress.  EYES: anicteric MOUTH: oral mucosa is pink RESPIRATORY: Normal respiratory effort,  symmetric expansion CARDIOVASCULAR: No peripheral edema SKIN: No rash, lesions or ulcers MUSCULOSKELETAL: no digital cyanosis NEURO: Cranial Nerves II-XII grossly intact, moves all extremities PSYCH: Intact judgement and insight.  A&O x 3, Mood/affect appropriate   ASSESSMENT & PLAN:    1.  OSA - The pathophysiology of obstructive sleep apnea , it's cardiovascular consequences & modes of treatment including CPAP were discused with the patient in detail & they evidenced understanding.  The patient is tolerating PAP therapy well without any problems. The PAP download was reviewed today and showed an AHI of 4.4/hr on auto PCP with 100% compliance in using more than 4 hours nightly.  The patient has been using and benefiting from PAP use and will continue to benefit from therapy.   2.  HTN -BP is borderline controlled on exam but has not taken her meds yet -continue Cardizem CD 125m daily and Valsartan 466mBID.  3.  Obesity  -I have encouraged her to get into a routine exercise program and cut back on carbs and portions.   COVID-19 Education: The signs and symptoms of COVID-19 were discussed with the patient and how to seek care for testing (follow up with PCP or arrange E-visit).  The importance of social distancing was discussed today.  Patient Risk:   After full review of this patient's clinical status, I feel that they are at least moderate risk at this time.  Time:   Today, I have spent 20 minutes directly with the patient on telemedicine discussing medical problems including OSA< HTN, obesity.  We also reviewed the symptoms of COVID 19 and the ways to protect against contracting the virus with telehealth technology.  I spent an additional 5 minutes  reviewing patient's chart including PAP compliance download.  Medication Adjustments/Labs and Tests Ordered: Current medicines are reviewed at length with the patient today.  Concerns regarding medicines are outlined above.  Tests Ordered: No orders of the defined types were placed in this encounter.  Medication Changes: No orders of the defined types were placed in this encounter.   Disposition:  Follow up in 1 year(s)  Signed, TrFransico HimMD  01/13/2019 8:40 AM    [facility]

## 2019-01-13 ENCOUNTER — Other Ambulatory Visit: Payer: Self-pay

## 2019-01-13 ENCOUNTER — Telehealth (INDEPENDENT_AMBULATORY_CARE_PROVIDER_SITE_OTHER): Payer: Medicare Other | Admitting: Cardiology

## 2019-01-13 VITALS — BP 157/66 | HR 67 | Ht 64.0 in | Wt 212.0 lb

## 2019-01-13 DIAGNOSIS — E669 Obesity, unspecified: Secondary | ICD-10-CM | POA: Diagnosis not present

## 2019-01-13 DIAGNOSIS — G4733 Obstructive sleep apnea (adult) (pediatric): Secondary | ICD-10-CM | POA: Diagnosis not present

## 2019-01-13 DIAGNOSIS — I10 Essential (primary) hypertension: Secondary | ICD-10-CM

## 2019-01-13 NOTE — Patient Instructions (Signed)
Medication Instructions:  Your physician recommends that you continue on your current medications as directed. Please refer to the Current Medication list given to you today.  If you need a refill on your cardiac medications before your next appointment, please call your pharmacy.   Lab work: none If you have labs (blood work) drawn today and your tests are completely normal, you will receive your results only by: . MyChart Message (if you have MyChart) OR . A paper copy in the mail If you have any lab test that is abnormal or we need to change your treatment, we will call you to review the results.  Testing/Procedures: none  Follow-Up: Your physician wants you to follow-up in: 12 months with Dr Turner.  You will receive a reminder letter in the mail two months in advance. If you don't receive a letter, please call our office to schedule the follow-up appointment.   

## 2019-01-21 ENCOUNTER — Other Ambulatory Visit: Payer: Self-pay | Admitting: Neurological Surgery

## 2019-01-21 DIAGNOSIS — M545 Low back pain, unspecified: Secondary | ICD-10-CM

## 2019-01-21 DIAGNOSIS — G8929 Other chronic pain: Secondary | ICD-10-CM

## 2019-01-24 ENCOUNTER — Other Ambulatory Visit: Payer: Self-pay

## 2019-01-24 ENCOUNTER — Other Ambulatory Visit: Payer: Medicare Other

## 2019-01-24 ENCOUNTER — Ambulatory Visit
Admission: RE | Admit: 2019-01-24 | Discharge: 2019-01-24 | Disposition: A | Discharge status: Home / Self Care | Payer: Medicare Other | Source: Ambulatory Visit | Attending: Neurological Surgery | Admitting: Neurological Surgery

## 2019-01-24 DIAGNOSIS — M545 Low back pain, unspecified: Secondary | ICD-10-CM

## 2019-01-24 DIAGNOSIS — G8929 Other chronic pain: Secondary | ICD-10-CM

## 2019-01-30 ENCOUNTER — Telehealth: Payer: Self-pay | Admitting: *Deleted

## 2019-01-30 NOTE — Telephone Encounter (Signed)
Dr  Tamala Julian, please review aspirin, can she hold aspirin for 7 days prior to myelogram? Previous CABG in 2013, last cath in 2016 showed severe native disease, patent grafts. She was seen in the clinic 3 weeks ago.  Please send your comment to P CV DIV PREOP

## 2019-01-30 NOTE — Telephone Encounter (Signed)
   [facility] Pre-operative Risk Assessment    Request for surgical clearance:  1. What type of surgery is being performed? LUMBAR MYELOGRAM/CT   2. When is this surgery scheduled? TBD   3. What type of clearance is required (medical clearance vs. Pharmacy clearance to hold med vs. Both)? BOTH  4. Are there any medications that need to be held prior to surgery and how long? ELIQUIS AND ASA   5. Practice name and name of physician performing surgery? [facility]; DR. Kristeen Miss   6. What is your office phone number [phone]    7.   What is your office fax number [phone]  8.   Anesthesia type (None, local, MAC, general) ? NOT LISTED    [name] 01/30/2019, 4:44 PM  _________________________________________________________________   (provider comments below)

## 2019-01-30 NOTE — Telephone Encounter (Signed)
Clinical pharmacist to review eliquis 

## 2019-01-31 NOTE — Telephone Encounter (Signed)
Patient with diagnosis of afib on Eliquis for anticoagulation.    Procedure: LUMBAR MYELOGRAM/CT  Date of procedure: TBD  CHADS2-VASc score of  5 (HTN, AGE, CAD, AGE, female)  CrCl 52 ml/min  Per office protocol, patient can hold Eliquis for 3 days prior to procedure.

## 2019-02-03 ENCOUNTER — Other Ambulatory Visit: Payer: Self-pay

## 2019-02-03 ENCOUNTER — Ambulatory Visit (INDEPENDENT_AMBULATORY_CARE_PROVIDER_SITE_OTHER): Payer: Medicare Other | Admitting: Bariatrics

## 2019-02-03 ENCOUNTER — Other Ambulatory Visit (HOSPITAL_COMMUNITY): Payer: Self-pay | Admitting: Neurological Surgery

## 2019-02-03 ENCOUNTER — Other Ambulatory Visit: Payer: Self-pay | Admitting: Neurological Surgery

## 2019-02-03 ENCOUNTER — Encounter: Payer: Self-pay | Admitting: Bariatrics

## 2019-02-03 ENCOUNTER — Encounter (INDEPENDENT_AMBULATORY_CARE_PROVIDER_SITE_OTHER): Payer: Self-pay | Admitting: Bariatrics

## 2019-02-03 VITALS — BP 120/65 | HR 77 | Temp 98.1°F | Ht 64.0 in | Wt 212.0 lb

## 2019-02-03 DIAGNOSIS — R0602 Shortness of breath: Secondary | ICD-10-CM | POA: Diagnosis not present

## 2019-02-03 DIAGNOSIS — K632 Fistula of intestine: Secondary | ICD-10-CM

## 2019-02-03 DIAGNOSIS — F3289 Other specified depressive episodes: Secondary | ICD-10-CM | POA: Diagnosis not present

## 2019-02-03 DIAGNOSIS — E038 Other specified hypothyroidism: Secondary | ICD-10-CM

## 2019-02-03 DIAGNOSIS — M48062 Spinal stenosis, lumbar region with neurogenic claudication: Secondary | ICD-10-CM

## 2019-02-03 DIAGNOSIS — Z6836 Body mass index (BMI) 36.0-36.9, adult: Secondary | ICD-10-CM

## 2019-02-03 DIAGNOSIS — R5383 Other fatigue: Secondary | ICD-10-CM

## 2019-02-03 DIAGNOSIS — I25709 Atherosclerosis of coronary artery bypass graft(s), unspecified, with unspecified angina pectoris: Secondary | ICD-10-CM | POA: Diagnosis not present

## 2019-02-03 DIAGNOSIS — E119 Type 2 diabetes mellitus without complications: Secondary | ICD-10-CM

## 2019-02-03 DIAGNOSIS — E271 Primary adrenocortical insufficiency: Secondary | ICD-10-CM

## 2019-02-03 DIAGNOSIS — Z0289 Encounter for other administrative examinations: Secondary | ICD-10-CM

## 2019-02-03 DIAGNOSIS — G8929 Other chronic pain: Secondary | ICD-10-CM

## 2019-02-03 DIAGNOSIS — M545 Low back pain, unspecified: Secondary | ICD-10-CM

## 2019-02-03 DIAGNOSIS — I10 Essential (primary) hypertension: Secondary | ICD-10-CM

## 2019-02-03 NOTE — Telephone Encounter (Signed)
   Primary Cardiologist: Sinclair Grooms, MD  Chart reviewed as part of pre-operative protocol coverage. Patient was contacted 02/03/2019 in reference to pre-operative risk assessment for pending surgery as outlined below.  [name] was last seen on 01/07/2019 by [name].  Since that day, [name] has done well without chest pain or shortness of breath.  Therefore, based on ACC/AHA guidelines, the patient would be at acceptable risk for the planned procedure without further cardiovascular testing.   I will route this recommendation to the requesting party via Epic fax function and remove from pre-op pool.  Please call with questions. Per [name], ok to hold aspirin for 7 days. Our clinical pharmacist has reviewed this case, ok to hold eliquis for 3 days prior to the procedure. It is recommended for the patient to restart both medication as soon as possible after the procedure at the surgeon's discretion based on bleeding risk.  [place], [place] 02/03/2019, 6:22 PM

## 2019-02-03 NOTE — Progress Notes (Signed)
Office: [phone]  /  Fax: [phone]   Dear Dr. Joylene Scott,   Thank you for referring [name] to our clinic. The following note includes my evaluation and treatment recommendations.  HPI:   Chief Complaint: OBESITY    [name] has been referred by Abigail Infante, MD for consultation regarding her obesity and obesity related comorbidities.    [name] (MR# [id number]) is a 78 y.o. female who presents on 02/03/2019 for obesity evaluation and treatment. Current BMI is Body mass index is 36.39 kg/m.[name] [name] has been struggling with her weight for many years and has been unsuccessful in either losing weight, maintaining weight loss, or reaching her healthy weight goal.     [name] states that she does not like to cook secondary to some back issues. She snacks on carbohydrates, and she does have some night time eating behaviors.      [name] attended our information session and states she is currently in the action stage of change and ready to dedicate time achieving and maintaining a healthier weight. [name] is interested in becoming our patient and working on intensive lifestyle modifications including (but not limited to) diet, exercise and weight loss.    [name] states her desired weight loss is 62 lbs she started gaining weight in January 2020 her heaviest weight ever was 215 lbs she has significant food cravings issues  she snacks frequently in the evenings she wakes up frquently in the middle of the night to eat she is frequently drinking liquids with calories she frequently makes poor food choices she has problems with excessive hunger  she struggles with emotional eating    Fatigue [name] feels her energy is lower than it should be. This has worsened with weight gain and has not worsened recently. [name] admits to daytime somnolence and  denies waking up still tired. Patient is at risk for obstructive sleep apnea. [name] has a history of symptoms of daytime fatigue.  Patient generally gets 8 hours of sleep per night, and states they generally have generally restful sleep. Snoring is present. Apneic episodes are present. Epworth Sleepiness Score is 2.  Dyspnea on exertion [name] notes increasing shortness of breath with exercising and seems to be worsening over time with weight gain. She notes getting out of breath sooner with activity than she used to. This has not gotten worse recently. [name] denies orthopnea.  Hypertension [name] is a 78 y.o. female with hypertension. [name]'s blood pressure is well controlled. She is taking Diltiazem and Diovan. She is working on weight loss to help control her blood pressure with the goal of decreasing her risk of heart attack and stroke.   CAD with Coronary Bypass [name] has A-fib (controlled on Amiodarone). She is on Pacerone, Lasix, aspirin, Diltiazem, and Diovan.  Hypothyroidism [name] has a diagnosis of hypothyroidism. She is currently on levothyroxine. She notes fatigue, but denies hot or cold intolerance.  Chronic Back Pain [name] notes back pain (post lumbar fusion). She is status post sinal cord stimulator.  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 There are no specific foods that she cannot eat. She needs to eat more fiber.  Addison Disease [name] has a diagnosis of addison disease. She is taking hydrocortisone 35 mg.  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 Last A1c was 7.2. She denies hypoglycemia. She has been working on intensive lifestyle modifications including diet, exercise, and weight loss to  help control her blood glucose levels.  Depression with Emotional Eating Behaviors [name] notes night time eating behaviors. [name] struggles with emotional eating and using food for comfort to the extent that it is negatively impacting her health. She often snacks when she is not hungry. [name] sometimes feels she is out  of control and then feels guilty that she made poor food choices. She has been working on behavior modification techniques to help reduce her emotional eating and has been somewhat successful. She shows no sign of suicidal or homicidal ideations.  Depression Screen [name]'s Food and Mood (modified PHQ-9) score was  Depression screen PHQ 2/9 02/03/2019  Decreased Interest 3  Down, Depressed, Hopeless 2  PHQ - 2 Score 5  Altered sleeping 0  Tired, decreased energy 3  Change in appetite 1  Feeling bad or failure about yourself  0  Trouble concentrating 0  Moving slowly or fidgety/restless 0  Suicidal thoughts 0  PHQ-9 Score 9  Difficult doing work/chores Somewhat difficult  Some recent data might be hidden    ASSESSMENT AND PLAN:  Other fatigue - Plan: EKG 12-Lead, VITAMIN D 25 Hydroxy (Vit-D Deficiency, Fractures), Vitamin B12  SOB (shortness of breath) on exertion  Essential hypertension  Coronary artery disease involving coronary bypass graft of native heart with angina pectoris (HCC)  Other specified hypothyroidism  Chronic low back pain, unspecified back pain laterality, unspecified whether sciatica present  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 Labs will be ordered, and in the meanwhile [name] has agreed to work on diet, exercise and weight loss to help with fatigue. Proper sleep hygiene was discussed including the need for 7-8 hours of quality sleep each night. A sleep study was not ordered based on symptoms and Epworth score.  Dyspnea on  exertion [name]'s shortness of breath appears to be obesity related and exercise induced. She has agreed to work on weight loss and gradually increase exercise to treat her exercise induced shortness of breath. If [name] follows our instructions and loses weight without improvement of her shortness of breath, we will plan to refer to pulmonology. We will monitor this condition regularly. Lennox agrees to this plan.  Hypertension We discussed sodium restriction, working on healthy weight loss, and a regular exercise program as the means to achieve improved blood pressure control. [name] agreed with this plan and agreed to follow up as directed. We will continue to monitor her blood pressure as well as her progress with the above lifestyle modifications. Ashtyn will continue her medications as prescribed and will watch for signs of hypotension as she continues her lifestyle modifications. Hajira agrees to follow up with our clinic in 2 to 3 weeks.  CAD with Coronary Bypass [name] will continue her medications, and she is to follow up with her Cardiologist in 3 months. [name] agrees to follow up with our clinic in 2 to 3 weeks.  Hypothyroidism [name] was informed of the importance of good thyroid control to help with weight loss efforts. She was also informed that supertheraputic thyroid levels are dangerous and will not improve weight loss results. [name]  is to follow up with Endocrinology, and she agrees to follow up with our clinic in 2 to 3 weeks.  Chronic Back Pain [name] is to gradually increase activity, and will follow up.  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 She is to follow up with her primary care physician, and she agrees to follow up with our clinic in 2 to 3 weeks.  Addison Disease [name] is to follow up with Endocrinology every 4-6 months, and she agrees to follow up with our clinic in 2 to 3 weeks.  Diabetes II [name] has been given extensive diabetes  education by myself today including ideal fasting and post-prandial blood glucose readings, individual ideal Hgb A1c goals and hypoglycemia prevention. We discussed the importance of good blood sugar control to decrease the likelihood of diabetic complications such as nephropathy, neuropathy, limb loss, blindness, coronary artery disease, and death. We discussed the importance of intensive lifestyle modification including diet, exercise and weight loss as the first line treatment for diabetes. Tanielle is to follow up with her Endocrinologist.  Depression with Emotional Eating Behaviors We discussed behavior modification techniques today to help [name] deal with her emotional eating and depression. We will refer to Dr. Mallie Mussel, our Bariatric Psychologist for evaluation.   Depression Screen Tevis had a moderately positive depression screening. Depression is commonly associated with obesity and often results in emotional eating behaviors. We will monitor this closely and work on CBT to help improve the non-hunger eating patterns. Referral to Psychology may be required if no improvement is seen as she continues in our clinic.  Obesity Neda is currently in the action stage of change and her goal is to continue with weight loss efforts. I recommend Bridgitt begin the structured treatment plan as follows:  She has agreed to follow the Category 2 plan, with additional 1-2 breakfast options Teniya has been instructed to eventually work up to a goal of 150 minutes of combined cardio and strengthening exercise per week for weight loss and overall health benefits. We discussed the following Behavioral Modification Strategies today: increasing lean protein intake, decreasing simple carbohydrates, increasing vegetables, decrease eating out, work on meal planning and easy cooking plans, decrease liquid calories, increase H20 intake, no skipping meals, keeping healthy foods in the home, and planning for success Layah is to  stop all sugary drinks, and stop night time eating.   She was informed of the importance of frequent follow up visits to maximize her success with intensive lifestyle modifications for her multiple health conditions. She was informed we would discuss her lab results at her next visit unless there is a critical issue that needs to be addressed sooner. Royalty agreed to keep her next visit at the agreed upon time to discuss these results.  ALLERGIES: Allergies  Allergen Reactions   Oxycodone Other (See Comments)    Hallucinations    Tramadol Other (See Comments)    Hallucinations   Sulfa Antibiotics Itching    Vaginal itching   Sulfacetamide Sodium Itching    Vaginal itching    MEDICATIONS: Current Outpatient Medications on File Prior to Visit  Medication Sig Dispense Refill   acetaminophen (TYLENOL) 500 MG tablet Take 500 mg by mouth every 6 (six) hours as needed for mild pain or headache.      amiodarone (PACERONE) 200 MG tablet Take 1 tablet (200 mg total) by mouth daily. 90 tablet 3   aspirin EC 81 MG tablet Take 81 mg by mouth every Monday, Wednesday, and  Friday. In the morning.     clonazePAM (KLONOPIN) 0.5 MG tablet Take 0.25-0.5 mg by mouth 2 (two) times daily as needed for anxiety.     Cyanocobalamin 1000 MCG/ML KIT Inject 1,000 mcg into the muscle every 30 (thirty) days. Take one (1) injection as directed every month.     diltiazem (CARDIZEM CD) 180 MG 24 hr capsule Take 1 capsule (180 mg total) by mouth daily. 90 capsule 3   ELIQUIS 5 MG TABS tablet TAKE ONE TABLET TWICE DAILY 60 tablet 5   Evolocumab 140 MG/ML SOAJ Inject 140 mg into the skin every 14 (fourteen) days. 2 pen 11   FLUoxetine (PROZAC) 10 MG tablet Take 10 mg by mouth daily.      hydrocortisone (CORTEF) 20 MG tablet Take 20 mg by mouth 2 (two) times daily.     levothyroxine (SYNTHROID, LEVOTHROID) 88 MCG tablet Take 1 tablet (88 mcg total) by mouth daily before breakfast. 30 tablet 0   magnesium  oxide (MAG-OX) 400 MG tablet Take 1 tablet (400 mg total) by mouth daily. 90 tablet 1   mirabegron ER (MYRBETRIQ) 50 MG TB24 tablet Take 50 mg by mouth daily.     NITROSTAT 0.4 MG SL tablet ONE TABLET UNDER TONGUE AS NEEDED FOR CHEST PAIN AS DIRECTED 25 tablet 6   omeprazole (PRILOSEC) 20 MG capsule Take 20 mg by mouth daily.      rOPINIRole (REQUIP) 0.25 MG tablet Take 0.5 mg by mouth at bedtime.     valsartan (DIOVAN) 80 MG tablet TAKE 1/2 TABLET EVERY MORNING AND TAKE ADDITIONAL ONE-HALF TABLET IN THE AFTERNOON FOR BLOOD PRESSURE > 140/80 90 tablet 0   Vitamin D, Ergocalciferol, (DRISDOL) 50000 UNITS CAPS Take 50,000 Units by mouth 2 (two) times a week. Takes on Tuesdays and Fridays     furosemide (LASIX) 40 MG tablet Take 1 tablet (40 mg total) by mouth daily. 90 tablet 3   potassium chloride (K-DUR) 10 MEQ tablet Take 2 tablets (20 mEq total) by mouth 2 (two) times daily. 360 tablet 3   No current facility-administered medications on file prior to visit.     PAST MEDICAL HISTORY: Past Medical History:  Diagnosis Date   Addison's disease ([place])    Anxiety    Arthritis    "everywhere"   Atrial fibrillation ([place])    Eliquis   B12 deficiency    Back pain    CHF (congestive heart failure) ([place])    Chronic bronchitis ([place])    "although I didn't get it this year" (7/74/1287)   Complication of anesthesia    addison's disease causes hypotension after any surgery .  last knee surgery dr. Elmyra Ricks gave large dose of hydrocortisal and avoided the hypotensive side effectas of addison's disease.   Coronary artery disease    Depression    GERD (gastroesophageal reflux disease)    Hyperlipemia    Hypertension    Hypothyroidism    Lumbar radiculopathy 04/27/2014   Migraine    "in my 20's" (07/23/2017)   Osteoarthritis    Palpitations    Recurrent upper respiratory infection (URI)    SOB (shortness of breath)    UTI (lower urinary tract infection) 07/09/2011    Vitamin D deficiency     PAST SURGICAL HISTORY: Past Surgical History:  Procedure Laterality Date   ACHILLES TENDON SURGERY Left 07/16/2014   bone spur removed   ANTERIOR LUMBAR FUSION  1980   L4-5   [place]  2013   CARDIAC CATHETERIZATION N/A 09/02/2014   Procedure: Left Heart Cath and Coronary Angiography;  Surgeon: Wellington Hampshire, MD;  Location: [place] [facility];  Service: Cardiovascular;  Laterality: N/A;   CARDIOVERSION N/A 10/22/2015   Procedure: CARDIOVERSION;  Surgeon: Sanda Klein, MD;  Location: [place] [facility];  Service: Cardiovascular;  Laterality: N/A;   CARDIOVERSION N/A 06/07/2017   Procedure: CARDIOVERSION;  Surgeon: Larey Dresser, MD;  Location: [facility] END[facility];  Service: Cardiovascular;  Laterality: N/A;   CARDIOVERSION N/A 07/05/2017   Procedure: CARDIOVERSION;  Surgeon: Pixie Casino, MD;  Location: [facility] ENDOSCOPY;  Service: Cardiovascular;  Laterality: N/A;   CARDIOVERSION N/A 09/30/2018   Procedure: CARDIOVERSION;  Surgeon: Buford Dresser, MD;  Location: [place];  Service: Cardiovascular;  Laterality: N/A;   CARDIOVERSION N/A 11/26/2018   Procedure: CARDIOVERSION;  Surgeon: Sanda Klein, MD;  Location: [employer] [facility];  Service: Cardiovascular;  Laterality: N/A;   CARPAL TUNNEL RELEASE Right    CATARACT EXTRACTION, BILATERAL Bilateral    COLON RESECTION N/A 03/20/2018   Procedure: LAPAROSCOPIC SIGMOIDECTOMY WITH COLOSTOMY;  Surgeon: Ileana Roup, MD;  Location: [facility];  Service: General;  Laterality: N/A;   COLON SURGERY     laparascopic vs. open sigmoidectomy with cystoscopy  Dr. Dema Severin and Dr. Gloriann Loan 03-20-18   COLONOSCOPY WITH PROPOFOL N/A 01/17/2018   Procedure: COLONOSCOPY WITH PROPOFOL;  Surgeon: Milus Banister, MD;  Location: [facility];  Service: Endoscopy;  Laterality: N/A;   CORONARY ARTERY BYPASS GRAFT  06/26/2011   Procedure: CORONARY ARTERY BYPASS GRAFTING (CABG);  Surgeon: Melrose Nakayama, MD;  Location: [place];  Service: Open Heart Surgery;  Laterality: N/A;  times using Greater Saphenous Vein Graft harvested endoscopically from right leg   CYSTOSCOPY W/ URETERAL STENT PLACEMENT Bilateral 03/20/2018   Procedure: CYSTOSCOPY WITH BILATERAL RETROGRADE AND BILATERAL URETERAL CATHETER PLACEMENT;  Surgeon: Lucas Mallow, MD;  Location: [facility];  Service: Urology;  Laterality: Bilateral;   DIAGNOSTIC LAPAROSCOPY     DILATION AND CURETTAGE OF UTERUS     JOINT REPLACEMENT     KNEE ARTHROSCOPY Bilateral    LAPAROSCOPIC CHOLECYSTECTOMY     POLYPECTOMY  01/17/2018   Procedure: POLYPECTOMY;  Surgeon: Milus Banister, MD;  Location: [facility];  Service: Endoscopy;;   REVISION TOTAL KNEE ARTHROPLASTY Right    SPINAL CORD STIMULATOR IMPLANT  12/10/15   TAKE DOWN OF INTESTINAL FISTULA N/A 03/20/2018   Procedure: LAPAROSCOPIC TAKE DOWN OF COLOVESICAL FISTULA;  Surgeon: Ileana Roup, MD;  Location: [facility];  Service: General;  Laterality: N/A;   TONSILLECTOMY     TOTAL KNEE ARTHROPLASTY Bilateral     SOCIAL HISTORY: Social History   Tobacco Use   Smoking status: Passive Smoke Exposure - Never Smoker   Smokeless tobacco: Never Used   Tobacco comment: "husband passed in 2010; father passed 1978"  Substance Use Topics   Alcohol use: Yes    Alcohol/week: 0.0 standard drinks    Comment: glass of wine occasionally    Drug use: No    FAMILY HISTORY: Family History  Problem Relation Age of Onset   Heart attack Father    Heart disease Father    Hypertension Mother    Stroke Mother    Hypertension Maternal Grandfather    Colon cancer Neg Hx     ROS: Review of Systems  Constitutional: Positive for malaise/fatigue. Negative for weight loss.  HENT:       + Nasal stuffiness  Eyes:       +  Wear glasses  Respiratory: Positive for shortness of breath (with exertion).   Cardiovascular: Positive for palpitations. Negative for chest pain and  orthopnea.       + Leg cramping  Musculoskeletal: Positive for back pain.  Skin:       + Dryness  Endo/Heme/Allergies: Bruises/bleeds easily.       Negative hot/cold intolerance  Psychiatric/Behavioral: Positive for depression. Negative for suicidal ideas.       + Stress    PHYSICAL EXAM: Blood pressure 120/65, pulse 77, temperature 98.1 F (36.7 C), height '5\' 4"'  (1.626 m), weight 212 lb (96.2 kg), SpO2 97 %. Body mass index is 36.39 kg/m. Physical Exam Vitals signs reviewed.  Constitutional:      Appearance: Normal appearance. She is obese.  HENT:     Head: Normocephalic and atraumatic.     Nose: Nose normal.  Eyes:     General: No scleral icterus.    Extraocular Movements: Extraocular movements intact.  Neck:     Musculoskeletal: Normal range of motion and neck supple.     Comments: No thyromegaly present Cardiovascular:     Rate and Rhythm: Normal rate and regular rhythm.     Pulses: Normal pulses.     Heart sounds: Normal heart sounds.  Pulmonary:     Effort: Pulmonary effort is normal. No respiratory distress.     Breath sounds: Normal breath sounds.  Abdominal:     Palpations: Abdomen is soft.     Tenderness: There is no abdominal tenderness.     Comments: + Obesity  Musculoskeletal: Normal range of motion.     Right lower leg: No edema.     Left lower leg: No edema.  Skin:    General: Skin is warm and dry.  Neurological:     Mental Status: She is alert and oriented to person, place, and time.     Coordination: Coordination normal.  Psychiatric:        Mood and Affect: Mood normal.        Behavior: Behavior normal.     RECENT LABS AND TESTS: BMET    Component Value Date/Time   NA 141 11/20/2018 0951   K 3.9 11/20/2018 0951   CL 99 11/20/2018 0951   CO2 25 11/20/2018 0951   GLUCOSE 169 (H) 11/20/2018 0951   GLUCOSE 143 (H) 10/07/2018 1157   BUN 16 11/20/2018 0951   CREATININE 1.03 (H) 11/20/2018 0951   CREATININE 0.80 12/07/2015 0933   CALCIUM  9.2 11/20/2018 0951   GFRNONAA 52 (L) 11/20/2018 0951   GFRAA 60 11/20/2018 0951   Lab Results  Component Value Date   HGBA1C 5.8 (H) 03/15/2018   No results found for: INSULIN CBC    Component Value Date/Time   WBC 10.4 11/20/2018 0951   WBC 9.7 04/02/2018 0611   RBC 4.63 11/20/2018 0951   RBC 4.01 04/02/2018 0611   HGB 13.3 11/20/2018 0951   HCT 40.8 11/20/2018 0951   PLT 321 11/20/2018 0951   MCV 88 11/20/2018 0951   MCH 28.7 11/20/2018 0951   MCH 29.9 04/02/2018 0611   MCHC 32.6 11/20/2018 0951   MCHC 31.9 04/02/2018 0611   RDW 14.0 11/20/2018 0951   LYMPHSABS 2.8 03/15/2018 1213   MONOABS 1.1 (H) 03/15/2018 1213   EOSABS 0.0 03/15/2018 1213   BASOSABS 0.1 03/15/2018 1213   Iron/TIBC/Ferritin/ %Sat No results found for: IRON, TIBC, FERRITIN, IRONPCTSAT Lipid Panel     Component Value Date/Time   CHOL 115 07/16/2018  1023   TRIG 87 07/16/2018 1023   HDL 53 07/16/2018 1023   CHOLHDL 2.2 07/16/2018 1023   CHOLHDL 2.5 09/02/2014 0346   VLDL 16 09/02/2014 0346   LDLCALC 45 07/16/2018 1023   LDLDIRECT 135 (H) 02/04/2018 1555   Hepatic Function Panel     Component Value Date/Time   PROT 6.0 11/20/2018 0951   ALBUMIN 3.9 11/20/2018 0951   AST 18 11/20/2018 0951   ALT 16 11/20/2018 0951   ALKPHOS 77 11/20/2018 0951   BILITOT 0.5 11/20/2018 0951   BILIDIR 0.18 09/26/2018 1109      Component Value Date/Time   TSH 2.710 11/20/2018 0951   TSH 0.282 (L) 09/25/2017 0709    ECG  shows NSR with a rate of 75 BPM INDIRECT CALORIMETER done today shows a VO2 of 232 and a REE of 1618.  Her calculated basal metabolic rate is 7341 thus her basal metabolic rate is better than expected.       OBESITY BEHAVIORAL INTERVENTION VISIT  Today's visit was # 1   Starting weight: 212 lbs Starting date: 02/03/2019 Today's weight : 212 lbs  Today's date: 02/03/2019 Total lbs lost to date: 0 At least 15 minutes were spent on discussing the following behavioral intervention  visit.   ASK: We discussed the diagnosis of obesity with [name] today and [name] agreed to give [place] permission to discuss obesity behavioral modification therapy today.  ASSESS: Lamanda has the diagnosis of obesity and her BMI today is 36.37 Khadeja is in the action stage of change   ADVISE: Katrinna was educated on the multiple health risks of obesity as well as the benefit of weight loss to improve her health. She was advised of the need for long term treatment and the importance of lifestyle modifications to improve her current health and to decrease her risk of future health problems.  AGREE: Multiple dietary modification options and treatment options were discussed and  Leslea agreed to follow the recommendations documented in the above note.  ARRANGE: Maritza was educated on the importance of frequent visits to treat obesity as outlined per CMS and USPSTF guidelines and agreed to schedule her next follow up appointment today.  Wilhemena Durie, am acting as transcriptionist for [employer], DO   I have reviewed the above documentation for accuracy and completeness, and I agree with the above. -Jearld Lesch, DO

## 2019-02-03 NOTE — Telephone Encounter (Signed)
Okay to hold aspirin. 

## 2019-02-04 LAB — VITAMIN B12: Vitamin B-12: 354 pg/mL (ref 232–1245)

## 2019-02-04 LAB — VITAMIN D 25 HYDROXY (VIT D DEFICIENCY, FRACTURES): Vit D, 25-Hydroxy: 46.4 ng/mL (ref 30.0–100.0)

## 2019-02-05 ENCOUNTER — Encounter (INDEPENDENT_AMBULATORY_CARE_PROVIDER_SITE_OTHER): Payer: Self-pay | Admitting: Bariatrics

## 2019-02-05 NOTE — Progress Notes (Signed)
Office: [phone]  /  Fax: [phone]    Date: February 11, 2019  Time Seen: 12:07pm Duration: 41 minutes Provider: Glennie Isle, PsyD Type of Session: Intake for Individual Therapy  Type of Contact: Face-to-face  Informed Consent for In-Person Services During COVID-19: During today's appointment, information about the decision to initiate in-person services in light of the QHUTM-54 public health crisis was discussed. [name] and this provider agreed to meet in person for some or all future appointments. If there is a resurgence of the pandemic or other health concerns arise, telepsychological services may be initiated and any related concerns will be discussed and an attempt to address them will be made. [name] verbally acknowledged understanding that if necessary, this provider may determine there is a need to initiate telepsychological services for everyone's well-being. [name] expressed understanding she may request to initiate telepsychological services, and that request will be respected as long as it is feasible and clinically appropriate. Regarding telepsychological services, [name] acknowledged she is ultimately responsible for understanding her insurance benefits as it relates to reimbursement of telepsychological services. Moreover, the risks for opting for in-person services was discussed. [name] verbally acknowledged understanding that by coming to the office, she is assuming the risk of exposure to the coronavirus or other public risk, and the risk may increase if [name] travels by public transportation, cab, or [employer]. To obtain in-person services, [name] verbally agreed to taking certain precautions (e.g., screening prior to appointment; universal masking; social distancing of 6 feet; proper hand hygiene; no visitors) set forth by [facility] to keep everyone safe from exposure, sickness, and possible death. This information was shared by front desk staff either at the time of  scheduling and/or during the check-in process. [name] expressed understanding that should she not adhere to these safeguards, it may result in starting/returning to a telepsychological service arrangement and/or the exploration of other options for treatment. [name] acknowledged understanding that [facility] will follow the protocol set forth by [facility] should a patient present with a fever or other symptoms or disclose recent exposure, which will include rescheduling the appointment. Furthermore, [name] acknowledged understanding that precautions may change if additional local, state or federal orders or guidelines are published. This provider also shared that if [name] tests positive for the coronavirus, this provider may be required to notify local health authorities that [name] was in the [facility] clinic. Only minimum information necessary for data collection will be disclosed. This provider will follow [name]'s disclosure policy should this provider or staff test positive for the coronavirus. To avoid handling of paper/writing instruments and increasing likelihood of touching, verbal consent was obtained by [name] during today's appointment prior to proceeding. [name] provided verbal consent to proceed, and acknowledged understanding that by verbally consenting to proceed, she is agreeable to all information noted above.   Informed Consent: The provider's role was explained to [name]. The provider reviewed and discussed issues of confidentiality, privacy, and limits therein (e.g., reporting obligations). In addition to verbal informed consent, written informed consent for psychological services was obtained from [name] prior to the initial intake interview. Written consent included information concerning the practice, financial arrangements, and confidentiality and patients' rights. Since the clinic is not a 24/7 crisis center, mental health emergency resources were  shared, and the provider explained MyChart, e-mail, voicemail, and/or other messaging systems should be utilized only for non-emergency reasons. This provider also explained that information obtained during appointments will be placed in [name]'s medical  record in a confidential manner and relevant information will be shared with other providers at [facility] that she meets with for coordination of care. [name] verbally acknowledged understanding of the aforementioned, and agreed to use mental health emergency resources discussed if needed. Moreover, [name] agreed information may be shared with other [facility] providers as needed for coordination of care. By signing the service agreement document, [name] provided written consent for coordination of care.   Chief Complaint/HPI: [name] was referred by Dr. Jearld Lesch due to depression with emotional eating behaviors. Per the note for the initial visit with Dr. Jearld Lesch on February 03, 2019, "[name] notes night time eating behaviors. [name] struggles with emotional eating and using food for comfort to the extent that it is negatively impacting her health. She often snacks when she is not hungry. [name] sometimes feels she is out of control and then feels guilty that she made poor food choices. She has been working on behavior modification techniques to help reduce her emotional eating and has been somewhat successful. She shows no sign of suicidal or homicidal ideations." During the initial appointment with Dr. Jearld Lesch at [facility] on February 03, 2019, [name] further reported experiencing the following: significant food cravings issues , snacking frequently in the evenings, frequently drinking liquids with calories, frequently making poor food choices, struggling with emotional eating, waking up frquently in the middle of the night to eat and having problems with excessive hunger. [name]'s Food and Mood (modified PHQ-9)  score was 9.   During today's appointment, [name] was verbally administered a questionnaire assessing various behaviors related to emotional eating. [name] endorsed the following: use food to help you cope with emotional situations, find food is comforting to you and eat as a reward. She reported she does not have any specific cravings. [name] believes the onset of emotional eating was likely in her 31s and described the current frequency of emotional eating as "three times a day." In addition, [name] denied a history of binge eating. [name] denied a history of restricting food intake, purging and engagement in other compensatory strategies, and has never been diagnosed with an eating disorder. She also denied a history of treatment for emotional eating. Moreover, [name] indicated stress triggers emotional eating. She is unsure what makes emotional eating better. Furthermore, [name] endorsed other problems of concern. She explained, "I have a lot of health issues."   Mental Status Examination:  Appearance: neat Behavior: cooperative Mood: sad Affect: mood congruent; tearful when discussing certain aspects of history Speech: normal in rate, volume, and tone Eye Contact: appropriate Psychomotor Activity: appropriate Thought Process: linear, logical, and goal directed  Content/Perceptual Disturbances: denies suicidal and homicidal ideation, plan, and intent and no hallucinations, delusions, bizarre thinking or behavior reported or observed Orientation: time, person, place and purpose of appointment Cognition/Sensorium: memory, attention, language, and fund of knowledge intact  Insight: good Judgment: good  Family & Psychosocial History: [name] reported she is not in a relationship and noted she is a widow. She shared she has one son, age 23. She indicated she is currently not employed. Additionally, [name] shared her highest level of education obtained is a bachelor's degree. Currently, Nhyira's social support  system consists of her three good friends, son, son in law, and neighbors. Moreover, Macaria stated she resides alone.   Medical History:  Past Medical History:  Diagnosis Date   Addison's disease ([place])    Anxiety    Arthritis    "  everywhere"   Atrial fibrillation ([place])    Eliquis   B12 deficiency    Back pain    CHF (congestive heart failure) ([place])    Chronic bronchitis ([place])    "although I didn't get it this year" (2/70/7867)   Complication of anesthesia    addison's disease causes hypotension after any surgery .  last knee surgery dr. Elmyra Ricks gave large dose of hydrocortisal and avoided the hypotensive side effectas of addison's disease.   Coronary artery disease    Depression    GERD (gastroesophageal reflux disease)    Hyperlipemia    Hypertension    Hypothyroidism    Lumbar radiculopathy 04/27/2014   Migraine    "in my 20's" (07/23/2017)   Osteoarthritis    Palpitations    Recurrent upper respiratory infection (URI)    SOB (shortness of breath)    UTI (lower urinary tract infection) 07/09/2011   Vitamin D deficiency    Past Surgical History:  Procedure Laterality Date   ACHILLES TENDON SURGERY Left 07/16/2014   bone spur removed   ANTERIOR LUMBAR FUSION  1980   L4-5   BACK SURGERY     CARDIAC CATHETERIZATION  2013   CARDIAC CATHETERIZATION N/A 09/02/2014   Procedure: Left Heart Cath and Coronary Angiography;  Surgeon: Wellington Hampshire, MD;  Location: [facility];  Service: Cardiovascular;  Laterality: N/A;   CARDIOVERSION N/A 10/22/2015   Procedure: CARDIOVERSION;  Surgeon: Sanda Klein, MD;  Location: [place] [facility];  Service: Cardiovascular;  Laterality: N/A;   CARDIOVERSION N/A 06/07/2017   Procedure: CARDIOVERSION;  Surgeon: Larey Dresser, MD;  Location: [facility] ENDOSCOPY;  Service: Cardiovascular;  Laterality: N/A;   CARDIOVERSION N/A 07/05/2017   Procedure: CARDIOVERSION;  Surgeon: Pixie Casino, MD;  Location: [facility] END[facility]COPY;   Service: Cardiovascular;  Laterality: N/A;   CARDIOVERSION N/A 09/30/2018   Procedure: CARDIOVERSION;  Surgeon: Buford Dresser, MD;  Location: [place];  Service: Cardiovascular;  Laterality: N/A;   CARDIOVERSION N/A 11/26/2018   Procedure: CARDIOVERSION;  Surgeon: Sanda Klein, MD;  Location: [place];  Service: Cardiovascular;  Laterality: N/A;   CARPAL TUNNEL RELEASE Right    CATARACT EXTRACTION, BILATERAL Bilateral    COLON RESECTION N/A 03/20/2018   Procedure: LAPAROSCOPIC SIGMOIDECTOMY WITH COLOSTOMY;  Surgeon: Ileana Roup, MD;  Location: [facility];  Service: General;  Laterality: N/A;   COLON SURGERY     laparascopic vs. open sigmoidectomy with cystoscopy  Dr. Dema Severin and Dr. Gloriann Loan 03-20-18   COLONOSCOPY WITH PROPOFOL N/A 01/17/2018   Procedure: COLONOSCOPY WITH PROPOFOL;  Surgeon: Milus Banister, MD;  Location: [facility];  Service: Endoscopy;  Laterality: N/A;   CORONARY ARTERY BYPASS GRAFT  06/26/2011   Procedure: CORONARY ARTERY BYPASS GRAFTING (CABG);  Surgeon: Melrose Nakayama, MD;  Location: [place];  Service: Open Heart Surgery;  Laterality: N/A;  times using Greater Saphenous Vein Graft harvested endoscopically from right leg   CYSTOSCOPY W/ URETERAL STENT PLACEMENT Bilateral 03/20/2018   Procedure: CYSTOSCOPY WITH BILATERAL RETROGRADE AND BILATERAL URETERAL CATHETER PLACEMENT;  Surgeon: Lucas Mallow, MD;  Location: [facility];  Service: Urology;  Laterality: Bilateral;   DIAGNOSTIC LAPAROSCOPY     DILATION AND CURETTAGE OF UTERUS     JOINT REPLACEMENT     KNEE ARTHROSCOPY Bilateral    LAPAROSCOPIC CHOLECYSTECTOMY     POLYPECTOMY  01/17/2018   Procedure: POLYPECTOMY;  Surgeon: Milus Banister, MD;  Location: [facility];  Service: Endoscopy;;   REVISION TOTAL KNEE ARTHROPLASTY Right  SPINAL CORD STIMULATOR IMPLANT  12/10/15   TAKE DOWN OF INTESTINAL FISTULA N/A 03/20/2018   Procedure: LAPAROSCOPIC TAKE DOWN OF COLOVESICAL  FISTULA;  Surgeon: Ileana Roup, MD;  Location: [facility];  Service: General;  Laterality: N/A;   TONSILLECTOMY     TOTAL KNEE ARTHROPLASTY Bilateral    Current Outpatient Medications on File Prior to Visit  Medication Sig Dispense Refill   acetaminophen (TYLENOL) 500 MG tablet Take 500 mg by mouth every 6 (six) hours as needed for mild pain or headache.      amiodarone (PACERONE) 200 MG tablet Take 1 tablet (200 mg total) by mouth daily. 90 tablet 3   aspirin EC 81 MG tablet Take 81 mg by mouth every Monday, Wednesday, and Friday. In the morning.     clonazePAM (KLONOPIN) 0.5 MG tablet Take 0.25-0.5 mg by mouth 2 (two) times daily as needed for anxiety.     Cyanocobalamin 1000 MCG/ML KIT Inject 1,000 mcg into the muscle every 30 (thirty) days. Take one (1) injection as directed every month.     diltiazem (CARDIZEM CD) 180 MG 24 hr capsule Take 1 capsule (180 mg total) by mouth daily. 90 capsule 3   ELIQUIS 5 MG TABS tablet TAKE ONE TABLET TWICE DAILY 60 tablet 5   Evolocumab 140 MG/ML SOAJ Inject 140 mg into the skin every 14 (fourteen) days. 2 pen 11   FLUoxetine (PROZAC) 10 MG tablet Take 10 mg by mouth daily.      furosemide (LASIX) 40 MG tablet Take 1 tablet (40 mg total) by mouth daily. 90 tablet 3   hydrocortisone (CORTEF) 20 MG tablet Take 20 mg by mouth 2 (two) times daily.     levothyroxine (SYNTHROID, LEVOTHROID) 88 MCG tablet Take 1 tablet (88 mcg total) by mouth daily before breakfast. 30 tablet 0   magnesium oxide (MAG-OX) 400 MG tablet Take 1 tablet (400 mg total) by mouth daily. 90 tablet 1   mirabegron ER (MYRBETRIQ) 50 MG TB24 tablet Take 50 mg by mouth daily.     NITROSTAT 0.4 MG SL tablet ONE TABLET UNDER TONGUE AS NEEDED FOR CHEST PAIN AS DIRECTED (Patient taking differently: Place 0.4 mg under the tongue every 5 (five) minutes as needed for chest pain. ) 25 tablet 6   omeprazole (PRILOSEC) 20 MG capsule Take 20 mg by mouth daily.      potassium  chloride (K-DUR) 10 MEQ tablet Take 2 tablets (20 mEq total) by mouth 2 (two) times daily. 360 tablet 3   rOPINIRole (REQUIP) 0.25 MG tablet Take 0.5 mg by mouth at bedtime.     valsartan (DIOVAN) 80 MG tablet TAKE 1/2 TABLET EVERY MORNING AND TAKE ADDITIONAL ONE-HALF TABLET IN THE AFTERNOON FOR BLOOD PRESSURE > 140/80 (Patient taking differently: Take 80 mg by mouth at bedtime. ) 90 tablet 0   Vitamin D, Ergocalciferol, (DRISDOL) 50000 UNITS CAPS Take 50,000 Units by mouth 2 (two) times a week. Takes on Tuesdays and Fridays     No current facility-administered medications on file prior to visit.   Olisa denied a history of head injuries and loss of consciousness.    Mental Health History: [name] reported she met with a therapist in [place] in 2000 to help her cope with her son's sexuality. At the time, she indicated she could not speak with her husband about the aforementioned. [name] denied a history of hospitalizations for psychiatric concerns, and has never met with a psychiatrist. Eurika stated she is prescribed Prozac and Klonopin  PRN. She noted she was prescribed Klonopin by her pulmonologist and Prozac is prescribed by her PCP. Alencia denied a family history of mental health related concerns. Giada denied a trauma history, including psychological, physical  and sexual abuse, as well as neglect.   Zaryiah described her typical mood as waxing and waning due to current medical conditions. Aside from concerns noted above and endorsed on the PHQ-9 and GAD-7, Weston reported experiencing hopelessness due to her medical conditions and worry thoughts about her health and son's well-being. She also described experiencing panic attacks characterized by crying and difficulty breathing and described them the frequency as situational. Yelena denied current alcohol use. She denied tobacco use. She denied illicit/recreational substance use. Regarding caffeine intake, Jon reported consuming "some sodas and a  lot of tea." Furthermore, [name] denied experiencing the following: memory concerns, hallucinations and delusions, paranoia, symptoms of mania (e.g., expansive mood, flighty ideas, decreased need for sleep, engagement in risky behaviors), social withdrawal, crying spells and decreased motivation. She also denied history of and current suicidal ideation, plan, and intent; history of and current homicidal ideation, plan, and intent; and history of and current engagement in self-harm.  The following strengths were reported by [name]: very strong person, perseverance, caring, and resilient. The following strengths were observed by this provider: ability to express thoughts and feelings during the therapeutic session, ability to establish and benefit from a therapeutic relationship, ability to learn and practice coping skills, willingness to work toward established goal(s) with the clinic and ability to engage in reciprocal conversation.  Legal History: Najma denied a history of legal involvement.   Structured Assessment Results: The Patient Health Questionnaire-9 (PHQ-9) is a self-report measure that assesses symptoms and severity of depression over the course of the last two weeks. Maleiya obtained a score of 6 suggesting mild depression. Sharen finds the endorsed symptoms to be somewhat difficult. Little interest or pleasure in doing things 0  Feeling down, depressed, or hopeless 3  Trouble falling or staying asleep, or sleeping too much 0  Feeling tired or having little energy 3  Poor appetite or overeating 0  Feeling bad about yourself --- or that you are a failure or have let yourself or your family down 0  Trouble concentrating on things, such as reading the newspaper or watching television 0  Moving or speaking so slowly that other people could have noticed? Or the opposite --- being so fidgety or restless that you have been moving around a lot more than usual 0  Thoughts that you would be better off  dead or hurting yourself in some way 0  PHQ-9 Score 6    The Generalized Anxiety Disorder-7 (GAD-7) is a brief self-report measure that assesses symptoms of anxiety over the course of the last two weeks. Tyrone obtained a score of 4 suggesting minimal anxiety. Nalleli finds the endorsed symptoms to be very difficult. Feeling nervous, anxious, on edge 1  Not being able to stop or control worrying 3  Worrying too much about different things 0  Trouble relaxing 0  Being so restless that it's hard to sit still 0  Becoming easily annoyed or irritable 0  Feeling afraid as if something awful might happen 0  GAD-7 Score 4   Interventions: A chart review was conducted prior to the clinical intake interview. The PHQ-9, and GAD-7 were verbally administered and a clinical intake interview was completed. In addition, Yousra was verbally administered a Mood and Food questionnaire to assess various behaviors related to  emotional eating. Throughout session, empathic reflections and validation was provided. Continuing treatment with this provider was discussed and a treatment goal was established. Psychoeducation regarding emotional versus physical hunger was provided. Rosetta was given a handout to utilize between now and the next appointment to increase awareness of hunger patterns and subsequent eating.   Provisional DSM-5 Diagnosis: 311 (F32.8) Other Specified Depressive Disorder, Emotional Eating Behaviors  Plan: Landra appears able and willing to participate as evidenced by collaboration on a treatment goal, engagement in reciprocal conversation, and asking questions as needed for clarification. The next appointment will be scheduled in three weeks. The following treatment goal was established: decrease emotional eating. For the aforementioned goal, Marnell can benefit from individual therapy sessions that are brief in duration for approximately four to six sessions. The treatment modality will be individual  therapeutic services, including an eclectic therapeutic approach utilizing techniques from Cognitive Behavioral Therapy, Patient Centered Therapy, Dialectical Behavior Therapy, Acceptance and Commitment Therapy, Interpersonal Therapy, and Cognitive Restructuring. Therapeutic approach will include various interventions as appropriate, such as validation, support, mindfulness, thought defusion, reframing, psychoeducation, values assessment, and role playing. This provider will regularly review the treatment plan and medical chart to keep informed of status changes. Tashiya expressed understanding and agreement with the initial treatment plan of care.

## 2019-02-11 ENCOUNTER — Ambulatory Visit (INDEPENDENT_AMBULATORY_CARE_PROVIDER_SITE_OTHER): Payer: Medicare Other | Admitting: Psychology

## 2019-02-11 ENCOUNTER — Other Ambulatory Visit: Payer: Self-pay

## 2019-02-11 DIAGNOSIS — F3289 Other specified depressive episodes: Secondary | ICD-10-CM

## 2019-02-12 ENCOUNTER — Ambulatory Visit (HOSPITAL_COMMUNITY)
Admission: RE | Admit: 2019-02-12 | Discharge: 2019-02-12 | Disposition: A | Discharge status: Home / Self Care | Payer: Medicare Other | Source: Ambulatory Visit | Attending: Neurological Surgery | Admitting: Neurological Surgery

## 2019-02-12 ENCOUNTER — Other Ambulatory Visit: Payer: Self-pay

## 2019-02-12 DIAGNOSIS — M48062 Spinal stenosis, lumbar region with neurogenic claudication: Secondary | ICD-10-CM

## 2019-02-12 DIAGNOSIS — M5416 Radiculopathy, lumbar region: Secondary | ICD-10-CM | POA: Insufficient documentation

## 2019-02-12 MED ORDER — LIDOCAINE HCL (PF) 1 % IJ SOLN
5.0000 mL | Freq: Once | INTRAMUSCULAR | Status: AC
  Administered 2019-02-12: 09:00:00 5 mL via INTRADERMAL

## 2019-02-12 MED ORDER — ONDANSETRON HCL 4 MG/2ML IJ SOLN: 4.0000 mg | Freq: Four times a day (QID) | INTRAMUSCULAR | Status: DC | PRN

## 2019-02-12 MED ORDER — IOHEXOL 180 MG/ML  SOLN
20.0000 mL | Freq: Once | INTRAMUSCULAR | Status: AC | PRN
  Administered 2019-02-12: 09:00:00 12 mL via INTRATHECAL

## 2019-02-12 MED ORDER — DIAZEPAM 5 MG PO TABS
10.0000 mg | ORAL_TABLET | Freq: Once | ORAL | Status: AC
  Administered 2019-02-12: 08:00:00 10 mg via ORAL
  Filled 2019-02-12: qty 2

## 2019-02-12 MED ORDER — HYDROCODONE-ACETAMINOPHEN 5-325 MG PO TABS: 1.0000 | ORAL_TABLET | ORAL | Status: DC | PRN

## 2019-02-12 NOTE — Procedures (Addendum)
[name] is a 78 year old individual who has had a previous fusion at L4-L5 years ago.  She has evolved significant back pain with bilateral buttock pain and weakness in her lower extremities and is been getting progressively worse such that her capacity to walk even short distances is severely limited she has had an MRI little over a year ago and sometime ago she had a spinal cord stimulator placed she is now seen for further evaluation of this process and consideration of anything can be done for the pain in her low back.  On the MRI that she had done a year and a half ago it demonstrates that she has some mild stenosis at L3-4 and L2-3.  CT scan recently demonstrates that does not appear that she has a pseudoarthrosis at L4-L5 which has concern with we will see with myelogram demonstrates.  Pre op Dx: Lumbar stenosis with radiculopathy, neurogenic claudication Post op Dx: Same Procedure: Lumbar myelogram Surgeon: Guerino Caporale Puncture level: L2-3 Fluid color: Clear colorless Injection: Omnipaque 200, 12 mL Findings: Moderate to severe stenosis L2-3 and L3-4 above her previous fusion at L4-L5.  Further evaluation with CT scanning.  Slight retrolisthesis of L3 on L4 with upright posture

## 2019-02-12 NOTE — Progress Notes (Signed)
Spoke with Dr Ellene Route to clarify discharge time of 1100. Also pt is to start taking her eliquis again tomorrow.  Pt instructed

## 2019-02-12 NOTE — Discharge Instructions (Signed)
Myelogram  A myelogram is an imaging test. This test checks for problems in the spinal cord and the places where nerves attach to the spinal cord (nerve roots). A dye (contrast material) is put into your spine before the X-ray. This provides a clearer image for your doctor to see. You may need this test if you have a spinal cord problem that cannot be diagnosed with other imaging tests. You may also have this test to check your spine after surgery. Tell a doctor about:  Any allergies you have, especially to iodine.  All medicines you are taking, including vitamins, herbs, eye drops, creams, and over-the-counter medicines.  Any problems you or family members have had with anesthetic medicines or dye.  Any blood disorders you have.  Any surgeries you have had.  Any medical conditions you have or have had, including asthma.  Whether you are pregnant or may be pregnant. What are the risks? Generally, this is a safe procedure. However, problems may occur, including:  Infection.  Bleeding.  Allergic reaction to medicines or dyes.  Damage to your spinal cord or nerves.  Leaking of spinal fluid. This can cause a headache.  Damage to kidneys.  Seizures. This is rare. What happens before the procedure?  Follow instructions from your doctor about what you cannot eat or drink. You may be asked to drink more fluids.  Ask your doctor about changing or stopping your normal medicines. This is important if you take diabetes medicines or blood thinners.  Plan to have someone take you home from the hospital or clinic.  If you will be going home right after the procedure, plan to have someone with you for 24 hours. What happens during the procedure?  You will lie face down on a table.  Your doctor will find the best injection site on your spine. This is most often in the lower back.  This area will be washed with soap.  You will be given a medicine to numb the area (local  anesthetic).  Your doctor will place a long needle into the space around your spinal cord.  A sample of spinal fluid may be taken. This may be sent to the lab for testing.  The dye will be injected into the space around your spinal cord.  The exam table may be tilted. This helps the dye flow up or down your spine.  The X-ray will take images of your spinal cord.  A bandage (dressing) may be placed over the area where the dye was injected. The procedure may vary among doctors and hospitals. What can I expect after the procedure?  You may be monitored until you leave the hospital or clinic. This includes checking your blood pressure, heart rate, breathing rate, and blood oxygen level.  You may feel sore at the injection site. You may have a mild headache.  You will be told to lie flat with your head raised (elevated). This lowers the risk of a headache.  It is up to you to get the results of your procedure. Ask your doctor, or the department that is doing the procedure, when your results will be ready. Follow these instructions at home:   Rest as told by your doctor. Lie flat with your head slightly elevated.  Do not bend, lift, or do hard work for 24-48 hours, or as told by your doctor.  Take over-the-counter and prescription medicines only as told by your doctor.  Take care of your bandage as told by your  doctor.  Drink enough fluid to keep your pee (urine) pale yellow.  Bathe or shower as told by your doctor. Contact a doctor if:  You have a fever.  You have a headache that lasts longer than 24 hours.  You feel sick to your stomach (nauseous).  You vomit.  Your neck is stiff.  Your legs feel numb.  You cannot pee.  You cannot poop (no bowel movement).  You have a rash.  You are itchy or sneezing. Get help right away if:  You have new symptoms or your symptoms get worse.  You have a seizure.  You have trouble breathing. Summary  A myelogram is an  imaging test that checks for problems in the spinal cord and the places where nerves attach to the spinal cord (nerve roots).  Before the procedure, follow instructions from your doctor. You will be told what not to eat or drink, or what medicines to change or stop.  After the procedure, you will be told to lie flat with your head raised (elevated). This will lower your risk of a headache.  Do not bend, lift, or do any hard work for 24-48 hours, or as told by your doctor.  Contact a doctor if you have a stiff neck or numb legs. Get help right away if your symptoms get worse, or you have a seizure or trouble breathing. This information is not intended to replace advice given to you by your health care provider. Make sure you discuss any questions you have with your health care provider. Document Released: 12/28/2007 Document Revised: 05/29/2018 Document Reviewed: 05/30/2018 Elsevier Patient Education  2020 [employer].

## 2019-02-17 ENCOUNTER — Encounter (INDEPENDENT_AMBULATORY_CARE_PROVIDER_SITE_OTHER): Payer: Self-pay | Admitting: Bariatrics

## 2019-02-17 ENCOUNTER — Other Ambulatory Visit: Payer: Self-pay

## 2019-02-17 ENCOUNTER — Ambulatory Visit (INDEPENDENT_AMBULATORY_CARE_PROVIDER_SITE_OTHER): Payer: Medicare Other | Admitting: Bariatrics

## 2019-02-17 VITALS — BP 131/75 | HR 72 | Temp 98.0°F | Ht 64.0 in | Wt 212.0 lb

## 2019-02-17 DIAGNOSIS — E661 Drug-induced obesity: Secondary | ICD-10-CM | POA: Diagnosis not present

## 2019-02-17 DIAGNOSIS — E038 Other specified hypothyroidism: Secondary | ICD-10-CM | POA: Diagnosis not present

## 2019-02-17 DIAGNOSIS — Z6836 Body mass index (BMI) 36.0-36.9, adult: Secondary | ICD-10-CM

## 2019-02-17 DIAGNOSIS — E119 Type 2 diabetes mellitus without complications: Secondary | ICD-10-CM

## 2019-02-18 NOTE — Progress Notes (Signed)
Office: [phone]  /  Fax: [phone]   HPI:   Chief Complaint: OBESITY [name] is here to discuss her progress with her obesity treatment plan. She is on the Category 2 plan with Category 1 and 2 breakfast options and is following her eating plan approximately 50% of the time. She states she is exercising 0 minutes 0 times per week. [name]'s weight has remained the same. She is not doing well with her diet due to pain and being unable to cook. Her weight is 212 lb (96.2 kg) today and has not lost weight since her last visit. She has lost 0 lbs since starting treatment with [place].  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 Jaymi does not report checking her blood sugars. Last A1c was reported at 7.2. She has been working on intensive lifestyle modifications including diet, exercise, and weight loss to help control her blood glucose levels.  Hypothyroidism [name] has a diagnosis of hypothyroidism. She is taking Synthroid. She denies hot or cold intolerance or palpitations, but does admit to ongoing fatigue.  ASSESSMENT AND PLAN:  Type 2 diabetes mellitus without complication, without long-term current use of insulin (HCC)  Other specified hypothyroidism  Class 2 severe obesity with serious comorbidity and body mass index (BMI) of 36.0 to 36.9 in adult, unspecified obesity type ([place])  PLAN:  Diabetes II [name] has been given extensive diabetes education by myself today including ideal fasting and post-prandial blood glucose readings, individual ideal HgA1c goals  and hypoglycemia prevention. We discussed the importance of good blood sugar control to decrease the likelihood of diabetic complications such as nephropathy, neuropathy, limb loss, blindness, coronary artery disease, and death. We discussed the importance of intensive lifestyle modification including diet, exercise and weight loss as the first line treatment for diabetes. [name] will decrease  carbohydrates and increase protein and healthy fats.  Hypothyroidism [name] was informed of the importance of good thyroid control to help with weight loss efforts. She was also informed that supertheraputic thyroid levels are dangerous and will not improve weight loss results. [name] will continue Synthroid and follow-up as directed.  Obesity [name] is currently in the action stage of change. As such, her goal is to continue with weight loss efforts.  She has agreed to follow the Category 2 plan with Category 1 and 2 breakfast options and journal 1200 calories and 80 grams of protein. [name] will work on meal planning, intentional eating, will remove all unhealthy food from her house, and will count calories for snacks. She was given handout on Eating Out. [name] has been instructed to work up to a goal of 150 minutes of combined cardio and strengthening exercise per week for weight loss and overall health benefits. We discussed the following Behavioral Modification Strategies today: increasing lean protein intake, decreasing simple carbohydrates, increasing vegetables, increase H20 intake, decrease eating out, no skipping meals, work on meal planning and easy cooking plans, keeping healthy foods in the home, and planning for success.  [name] has agreed to follow-up with our clinic in 2-3 weeks. She was informed of the importance of frequent follow-up visits to maximize her success with intensive lifestyle modifications for her multiple health conditions.  ALLERGIES: Allergies  Allergen Reactions   Oxycodone Other (See Comments)    Hallucinations    Tramadol Other (See Comments)    Hallucinations   Sulfa Antibiotics Itching    Vaginal itching   Sulfacetamide Sodium Itching    Vaginal itching  MEDICATIONS: Current Outpatient Medications on File Prior to Visit  Medication Sig Dispense Refill   acetaminophen (TYLENOL) 500 MG tablet Take 500 mg by mouth every 6 (six) hours as needed for  mild pain or headache.      amiodarone (PACERONE) 200 MG tablet Take 1 tablet (200 mg total) by mouth daily. 90 tablet 3   aspirin EC 81 MG tablet Take 81 mg by mouth every Monday, Wednesday, and Friday. In the morning.     clonazePAM (KLONOPIN) 0.5 MG tablet Take 0.25-0.5 mg by mouth 2 (two) times daily as needed for anxiety.     Cyanocobalamin 1000 MCG/ML KIT Inject 1,000 mcg into the muscle every 30 (thirty) days. Take one (1) injection as directed every month.     diltiazem (CARDIZEM CD) 180 MG 24 hr capsule Take 1 capsule (180 mg total) by mouth daily. 90 capsule 3   ELIQUIS 5 MG TABS tablet TAKE ONE TABLET TWICE DAILY 60 tablet 5   Evolocumab 140 MG/ML SOAJ Inject 140 mg into the skin every 14 (fourteen) days. 2 pen 11   FLUoxetine (PROZAC) 10 MG tablet Take 10 mg by mouth daily.      hydrocortisone (CORTEF) 20 MG tablet Take 20 mg by mouth 2 (two) times daily.     levothyroxine (SYNTHROID, LEVOTHROID) 88 MCG tablet Take 1 tablet (88 mcg total) by mouth daily before breakfast. 30 tablet 0   magnesium oxide (MAG-OX) 400 MG tablet Take 1 tablet (400 mg total) by mouth daily. 90 tablet 1   mirabegron ER (MYRBETRIQ) 50 MG TB24 tablet Take 50 mg by mouth daily.     NITROSTAT 0.4 MG SL tablet ONE TABLET UNDER TONGUE AS NEEDED FOR CHEST PAIN AS DIRECTED (Patient taking differently: Place 0.4 mg under the tongue every 5 (five) minutes as needed for chest pain. ) 25 tablet 6   omeprazole (PRILOSEC) 20 MG capsule Take 20 mg by mouth daily.      rOPINIRole (REQUIP) 0.25 MG tablet Take 0.5 mg by mouth at bedtime.     valsartan (DIOVAN) 80 MG tablet TAKE 1/2 TABLET EVERY MORNING AND TAKE ADDITIONAL ONE-HALF TABLET IN THE AFTERNOON FOR BLOOD PRESSURE > 140/80 (Patient taking differently: Take 80 mg by mouth at bedtime. ) 90 tablet 0   Vitamin D, Ergocalciferol, (DRISDOL) 50000 UNITS CAPS Take 50,000 Units by mouth 2 (two) times a week. Takes on Tuesdays and Fridays     furosemide (LASIX)  40 MG tablet Take 1 tablet (40 mg total) by mouth daily. 90 tablet 3   potassium chloride (K-DUR) 10 MEQ tablet Take 2 tablets (20 mEq total) by mouth 2 (two) times daily. 360 tablet 3   No current facility-administered medications on file prior to visit.     PAST MEDICAL HISTORY: Past Medical History:  Diagnosis Date   Addison's disease ([place])    Anxiety    Arthritis    "everywhere"   Atrial fibrillation ([place])    Eliquis   B12 deficiency    Back pain    CHF (congestive heart failure) ([place])    Chronic bronchitis ([place])    "although I didn't get it this year" (7/91/5056)   Complication of anesthesia    addison's disease causes hypotension after any surgery .  last knee surgery dr. Elmyra Ricks gave large dose of hydrocortisal and avoided the hypotensive side effectas of addison's disease.   Coronary artery disease    Depression    GERD (gastroesophageal reflux disease)    Hyperlipemia  Hypertension    Hypothyroidism    Lumbar radiculopathy 04/27/2014   Migraine    "in my 20's" (07/23/2017)   Osteoarthritis    Palpitations    Recurrent upper respiratory infection (URI)    SOB (shortness of breath)    UTI (lower urinary tract infection) 07/09/2011   Vitamin D deficiency     PAST SURGICAL HISTORY: Past Surgical History:  Procedure Laterality Date   ACHILLES TENDON SURGERY Left 07/16/2014   bone spur removed   ANTERIOR LUMBAR FUSION  1980   L4-5   BACK SURGERY     CARDIAC CATHETERIZATION  2013   CARDIAC CATHETERIZATION N/A 09/02/2014   Procedure: Left Heart Cath and Coronary Angiography;  Surgeon: Wellington Hampshire, MD;  Location: [facility];  Service: Cardiovascular;  Laterality: N/A;   CARDIOVERSION N/A 10/22/2015   Procedure: CARDIOVERSION;  Surgeon: Sanda Klein, MD;  Location: [facility];  Service: Cardiovascular;  Laterality: N/A;   CARDIOVERSION N/A 06/07/2017   Procedure: CARDIOVERSION;  Surgeon: Larey Dresser, MD;  Location: [facility]  ENDOSCOPY;  Service: Cardiovascular;  Laterality: N/A;   CARDIOVERSION N/A 07/05/2017   Procedure: CARDIOVERSION;  Surgeon: Pixie Casino, MD;  Location: [facility] ENDOSCOPY;  Service: Cardiovascular;  Laterality: N/A;   CARDIOVERSION N/A 09/30/2018   Procedure: CARDIOVERSION;  Surgeon: Buford Dresser, MD;  Location: [facility];  Service: Cardiovascular;  Laterality: N/A;   CARDIOVERSION N/A 11/26/2018   Procedure: CARDIOVERSION;  Surgeon: Sanda Klein, MD;  Location: [facility];  Service: Cardiovascular;  Laterality: N/A;   CARPAL TUNNEL RELEASE Right    CATARACT EXTRACTION, BILATERAL Bilateral    COLON RESECTION N/A 03/20/2018   Procedure: LAPAROSCOPIC SIGMOIDECTOMY WITH COLOSTOMY;  Surgeon: Ileana Roup, MD;  Location: [facility];  Service: General;  Laterality: N/A;   COLON SURGERY     laparascopic vs. open sigmoidectomy with cystoscopy  Dr. Dema Severin and Dr. Gloriann Loan 03-20-18   COLONOSCOPY WITH PROPOFOL N/A 01/17/2018   Procedure: COLONOSCOPY WITH PROPOFOL;  Surgeon: Milus Banister, MD;  Location: [facility];  Service: Endoscopy;  Laterality: N/A;   CORONARY ARTERY BYPASS GRAFT  06/26/2011   Procedure: CORONARY ARTERY BYPASS GRAFTING (CABG);  Surgeon: Melrose Nakayama, MD;  Location: [facility];  Service: Open Heart Surgery;  Laterality: N/A;  times using Greater Saphenous Vein Graft harvested endoscopically from right leg   CYSTOSCOPY W/ URETERAL STENT PLACEMENT Bilateral 03/20/2018   Procedure: CYSTOSCOPY WITH BILATERAL RETROGRADE AND BILATERAL URETERAL CATHETER PLACEMENT;  Surgeon: Lucas Mallow, MD;  Location: [facility];  Service: Urology;  Laterality: Bilateral;   DIAGNOSTIC LAPAROSCOPY     DILATION AND CURETTAGE OF UTERUS     JOINT REPLACEMENT     KNEE ARTHROSCOPY Bilateral    LAPAROSCOPIC CHOLECYSTECTOMY     POLYPECTOMY  01/17/2018   Procedure: POLYPECTOMY;  Surgeon: Milus Banister, MD;  Location: [facility];  Service: Endoscopy;;   REVISION TOTAL  KNEE ARTHROPLASTY Right    SPINAL CORD STIMULATOR IMPLANT  12/10/15   TAKE DOWN OF INTESTINAL FISTULA N/A 03/20/2018   Procedure: LAPAROSCOPIC TAKE DOWN OF COLOVESICAL FISTULA;  Surgeon: Ileana Roup, MD;  Location: [facility];  Service: General;  Laterality: N/A;   TONSILLECTOMY     TOTAL KNEE ARTHROPLASTY Bilateral     SOCIAL HISTORY: Social History   Tobacco Use   Smoking status: Passive Smoke Exposure - Never Smoker   Smokeless tobacco: Never Used   Tobacco comment: "husband passed in 2010; father passed 1978"  Substance Use Topics  Alcohol use: Yes    Alcohol/week: 0.0 standard drinks    Comment: glass of wine occasionally    Drug use: No    FAMILY HISTORY: Family History  Problem Relation Age of Onset   Heart attack Father    Heart disease Father    Hypertension Mother    Stroke Mother    Hypertension Maternal Grandfather    Colon cancer Neg Hx    ROS: Review of Systems  Constitutional: Positive for malaise/fatigue.  Cardiovascular: Negative for palpitations.  Endo/Heme/Allergies:       Negative for hot/cold intolerance.   PHYSICAL EXAM: Blood pressure 131/75, pulse 72, temperature 98 F (36.7 C), height _0  (1.626 m), weight 212 lb (96.2 kg), SpO2 97 %. Body mass index is 36.39 kg/m. Physical Exam Vitals signs reviewed.  Constitutional:      Appearance: Normal appearance. She is obese.  Cardiovascular:     Rate and Rhythm: Normal rate.     Pulses: Normal pulses.  Pulmonary:     Effort: Pulmonary effort is normal.     Breath sounds: Normal breath sounds.  Musculoskeletal: Normal range of motion.  Skin:    General: Skin is warm and dry.  Neurological:     Mental Status: She is alert and oriented to person, place, and time.  Psychiatric:        Behavior: Behavior normal.   RECENT LABS AND TESTS: BMET    Component Value Date/Time   NA 141 11/20/2018 0951   K 3.9 11/20/2018 0951   CL 99 11/20/2018 0951   CO2 25 11/20/2018 0951    GLUCOSE 169 (H) 11/20/2018 0951   GLUCOSE 143 (H) 10/07/2018 1157   BUN 16 11/20/2018 0951   CREATININE 1.03 (H) 11/20/2018 0951   CREATININE 0.80 12/07/2015 0933   CALCIUM 9.2 11/20/2018 0951   GFRNONAA 52 (L) 11/20/2018 0951   GFRAA 60 11/20/2018 0951   Lab Results  Component Value Date   HGBA1C 5.8 (H) 03/15/2018   No results found for: INSULIN CBC    Component Value Date/Time   WBC 10.4 11/20/2018 0951   WBC 9.7 04/02/2018 0611   RBC 4.63 11/20/2018 0951   RBC 4.01 04/02/2018 0611   HGB 13.3 11/20/2018 0951   HCT 40.8 11/20/2018 0951   PLT 321 11/20/2018 0951   MCV 88 11/20/2018 0951   MCH 28.7 11/20/2018 0951   MCH 29.9 04/02/2018 0611   MCHC 32.6 11/20/2018 0951   MCHC 31.9 04/02/2018 0611   RDW 14.0 11/20/2018 0951   LYMPHSABS 2.8 03/15/2018 1213   MONOABS 1.1 (H) 03/15/2018 1213   EOSABS 0.0 03/15/2018 1213   BASOSABS 0.1 03/15/2018 1213   Iron/TIBC/Ferritin/ %Sat No results found for: IRON, TIBC, FERRITIN, IRONPCTSAT Lipid Panel     Component Value Date/Time   CHOL 115 07/16/2018 1023   TRIG 87 07/16/2018 1023   HDL 53 07/16/2018 1023   CHOLHDL 2.2 07/16/2018 1023   CHOLHDL 2.5 09/02/2014 0346   VLDL 16 09/02/2014 0346   LDLCALC 45 07/16/2018 1023   LDLDIRECT 135 (H) 02/04/2018 1555   Hepatic Function Panel     Component Value Date/Time   PROT 6.0 11/20/2018 0951   ALBUMIN 3.9 11/20/2018 0951   AST 18 11/20/2018 0951   ALT 16 11/20/2018 0951   ALKPHOS 77 11/20/2018 0951   BILITOT 0.5 11/20/2018 0951   BILIDIR 0.18 09/26/2018 1109      Component Value Date/Time   TSH 2.710 11/20/2018 0951   TSH 0.282 (L)  09/25/2017 0709   Results for GENIYAH, EISCHEID (MRN [id number]) as of 02/18/2019 11:43  Ref. Range 02/03/2019 11:20  Vitamin D, 25-Hydroxy Latest Ref Range: 30.0 - 100.0 ng/mL 46.4   OBESITY BEHAVIORAL INTERVENTION VISIT  Today's visit was #2  Starting weight: 212 lbs Starting date: 02/03/2019 Today's weight: 212 lbs  Today's date:  02/17/2019 Total lbs lost to date: 0 At least 15 minutes were spent on discussing the following behavioral intervention visit.    02/17/2019  Height _0  (1.626 m)  Weight 212 lb (96.2 kg)  BMI (Calculated) 36.37  BLOOD PRESSURE - SYSTOLIC 917  BLOOD PRESSURE - DIASTOLIC 75   Body Fat % 92.1 %  Total Body Water (lbs) 77.6 lbs   ASK: We discussed the diagnosis of obesity with [name] today and [name] agreed to give [place] permission to discuss obesity behavioral modification therapy today.  ASSESS: [name] has the diagnosis of obesity and her BMI today is 36.4. [name] is in the action stage of change.   ADVISE: Dia was educated on the multiple health risks of obesity as well as the benefit of weight loss to improve her health. She was advised of the need for long term treatment and the importance of lifestyle modifications to improve her current health and to decrease her risk of future health problems.  AGREE: Multiple dietary modification options and treatment options were discussed and  [name] agreed to follow the recommendations documented in the above note.  ARRANGE: Tya was educated on the importance of frequent visits to treat obesity as outlined per CMS and USPSTF guidelines and agreed to schedule her next follow up appointment today.  Migdalia Dk, am acting as Location manager for [employer], DO  I have reviewed the above documentation for accuracy and completeness, and I agree with the above. -Jearld Lesch, DO

## 2019-02-19 DIAGNOSIS — E661 Drug-induced obesity: Secondary | ICD-10-CM | POA: Insufficient documentation

## 2019-02-24 ENCOUNTER — Telehealth: Payer: Self-pay

## 2019-02-24 DIAGNOSIS — E785 Hyperlipidemia, unspecified: Secondary | ICD-10-CM

## 2019-02-24 NOTE — Telephone Encounter (Signed)
lmomed to schedule lipid labs 

## 2019-02-26 ENCOUNTER — Other Ambulatory Visit: Payer: Medicare Other | Admitting: *Deleted

## 2019-02-26 ENCOUNTER — Other Ambulatory Visit: Payer: Self-pay

## 2019-02-26 DIAGNOSIS — E785 Hyperlipidemia, unspecified: Secondary | ICD-10-CM

## 2019-02-26 LAB — LIPID PANEL
Chol/HDL Ratio: 3.8 ratio (ref 0.0–4.4)
Cholesterol, Total: 201 mg/dL — ABNORMAL HIGH (ref 100–199)
HDL: 53 mg/dL (ref >39)
LDL Chol Calc (NIH): 131 mg/dL — ABNORMAL HIGH (ref 0–99)
Triglycerides: 93 mg/dL (ref 0–149)
VLDL Cholesterol Cal: 17 mg/dL (ref 5–40)

## 2019-03-03 NOTE — Progress Notes (Unsigned)
  Office: [phone]  /  Fax: [phone]    Date: March 12, 2019   Time Seen:*** Duration: *** minutes Provider: Glennie Isle, Psy.D. Type of Session: Individual Therapy  Type of Contact: Face-to-face  Session Content: [name] is a 78 y.o. female presenting for a follow-up appointment to address the previously established treatment goal of decreasing emotional eating. The session was initiated with the administration of the PHQ-9 and GAD-7, as well as a brief check-in. *** [name] was receptive to today's session as evidenced by openness to sharing, responsiveness to feedback, and ***.  Mental Status Examination:  Appearance: {Appearance:22431} Behavior: {Behavior:22445} Mood: {gbmood:21757} Affect: {Affect:22436} Speech: {Speech:22432} Eye Contact: {Eye Contact:22433} Psychomotor Activity: {Motor Activity:22434} Thought Process: {thought process:22448}  Content/Perceptual Disturbances: {disturbances:22451} Orientation: {Orientation:22437} Cognition/Sensorium: {gbcognition:22449} Insight: {Insight:22446} Judgment: {Insight:22446}  Structured Assessment Results: The Patient Health Questionnaire-9 (PHQ-9) is a self-report measure that assesses symptoms and severity of depression over the course of the last two weeks. [name] obtained a score of *** suggesting {GBPHQ9SEVERITY:21752}. [name] finds the endorsed symptoms to be {gbphq9difficulty:21754}. [name] interest or pleasure in doing things ***  Feeling down, depressed, or hopeless ***  Trouble falling or staying asleep, or sleeping too much ***  Feeling tired or having [name] energy ***  Poor appetite or overeating ***  Feeling bad about yourself --- or that you are a failure or have let yourself or your family down ***  Trouble concentrating on things, such as reading the newspaper or watching television ***  Moving or speaking so slowly that other people could have noticed? Or the opposite --- being so fidgety or restless that  you have been moving around a lot more than usual ***  Thoughts that you would be better off dead or hurting yourself in some way ***  PHQ-9 Score ***    The Generalized Anxiety Disorder-7 (GAD-7) is a brief self-report measure that assesses symptoms of anxiety over the course of the last two weeks. [name] obtained a score of *** suggesting {gbgad7severity:21753}. [name] finds the endorsed symptoms to be {gbphq9difficulty:21754}. Feeling nervous, anxious, on edge ***  Not being able to stop or control worrying ***  Worrying too much about different things ***  Trouble relaxing ***  Being so restless that it's hard to sit still ***  Becoming easily annoyed or irritable ***  Feeling afraid as if something awful might happen ***  GAD-7 Score ***   Interventions:  {Interventions:22172}  DSM-5 Diagnosis: 311 (F32.8) Other Specified Depressive Disorder, Emotional Eating Behaviors  Treatment Goal & Progress: During the initial appointment with this provider, the following treatment goal was established: decrease emotional eating. [name] has demonstrated progress in her goal as evidenced by {gbtxprogress:22839}. [name] also reported {gbtxprogress2:22951}.  Plan: [name] continues to appear able and willing to participate as evidenced by engagement in reciprocal conversation, and asking questions for clarification as appropriate.*** The next appointment will be scheduled in {gbweeks:21758}. The next session will focus on reviewing learned skills, and working towards the established treatment goal.***

## 2019-03-04 ENCOUNTER — Other Ambulatory Visit: Payer: Self-pay | Admitting: Interventional Cardiology

## 2019-03-04 ENCOUNTER — Telehealth: Payer: Self-pay | Admitting: Pharmacist

## 2019-03-04 ENCOUNTER — Ambulatory Visit (INDEPENDENT_AMBULATORY_CARE_PROVIDER_SITE_OTHER): Payer: Self-pay | Admitting: Psychology

## 2019-03-04 DIAGNOSIS — E785 Hyperlipidemia, unspecified: Secondary | ICD-10-CM

## 2019-03-04 NOTE — Telephone Encounter (Signed)
Pt returned call to clinic - states she has only been taking her Repatha once a month instead of twice a month. No tolerability issues, was just forgetting her dose. Discussed the importance of adhering to twice monthly dosing schedule as her LDL has increased > 90mg /dL since her last lipid panel from 45 to 131. She states her last shot was about 3 weeks ago - advised her to give a dose of Repatha today, again on 12/15, and will recheck labs at her annual appt on 12/16.

## 2019-03-10 NOTE — Progress Notes (Unsigned)
°  Office: [phone]  /  Fax: [phone]    Date: March 17, 2019   Time Seen:*** Duration: *** minutes Provider: Glennie Isle, Psy.D. Type of Session: Individual Therapy  Type of Contact: Face-to-face  Session Content: [name] is a 78 y.o. female presenting for a follow-up appointment to address the previously established treatment goal of decreasing emotional eating. The session was initiated with the administration of the PHQ-9 and GAD-7, as well as a brief check-in. ***Psychoeducation regarding triggers for emotional eating was provided. [name] was provided a handout, and encouraged to utilize the handout between now and the next appointment to increase awareness of triggers and frequency. [name] agreed. This provider also discussed behavioral strategies for specific triggers, such as placing the utensil down when conversing to avoid mindless eating. [name] was receptive to today's appointment as evidenced by openness to sharing, responsiveness to feedback, and willingness to explore triggers for emotional eating.  Mental Status Examination:  Appearance: {Appearance:22431} Behavior: {Behavior:22445} Mood: {gbmood:21757} Affect: {Affect:22436} Speech: {Speech:22432} Eye Contact: {Eye Contact:22433} Psychomotor Activity: {Motor Activity:22434} Gait: {gbgait:23404} Thought Process: {thought process:22448}  Thought Content/Perception: {disturbances:22451} Orientation: {Orientation:22437} Memory/Concentration: {gbcognition:22449} Insight/Judgment: {Insight:22446}  Structured Assessments Results: The Patient Health Questionnaire-9 (PHQ-9) is a self-report measure that assesses symptoms and severity of depression over the course of the last two weeks. [name] obtained a score of *** suggesting {GBPHQ9SEVERITY:21752}. [name] finds the endorsed symptoms to be {gbphq9difficulty:21754}. [0= Not at all; 1= Several days; 2= More than half the days; 3= Nearly every day] Little interest or pleasure  in doing things ***  Feeling down, depressed, or hopeless ***  Trouble falling or staying asleep, or sleeping too much ***  Feeling tired or having little energy ***  Poor appetite or overeating ***  Feeling bad about yourself --- or that you are a failure or have let yourself or your family down ***  Trouble concentrating on things, such as reading the newspaper or watching television ***  Moving or speaking so slowly that other people could have noticed? Or the opposite --- being so fidgety or restless that you have been moving around a lot more than usual ***  Thoughts that you would be better off dead or hurting yourself in some way ***  PHQ-9 Score ***    The Generalized Anxiety Disorder-7 (GAD-7) is a brief self-report measure that assesses symptoms of anxiety over the course of the last two weeks. [name] obtained a score of *** suggesting {gbgad7severity:21753}. [name] finds the endorsed symptoms to be {gbphq9difficulty:21754}. [0= Not at all; 1= Several days; 2= Over half the days; 3= Nearly every day] Feeling nervous, anxious, on edge ***  Not being able to stop or control worrying ***  Worrying too much about different things ***  Trouble relaxing ***  Being so restless that it's hard to sit still ***  Becoming easily annoyed or irritable ***  Feeling afraid as if something awful might happen ***  GAD-7 Score ***   Interventions:  {Interventions for Progress Notes:23405}  DSM-5 Diagnosis: 311 (F32.8) Other Specified Depressive Disorder, Emotional Eating Behaviors  Treatment Goal & Progress: During the initial appointment with this provider, the following treatment goal was established: decrease emotional eating. [name] has demonstrated progress in her goal as evidenced by {gbtxprogress:22839}. [name] also {gbtxprogress2:22951}.  Plan: The next appointment will be scheduled in {gbweeks:21758}. The next session will focus on {Plan for Next Appointment:23400}.

## 2019-03-12 ENCOUNTER — Ambulatory Visit (INDEPENDENT_AMBULATORY_CARE_PROVIDER_SITE_OTHER): Payer: Medicare Other | Admitting: Bariatrics

## 2019-03-12 ENCOUNTER — Ambulatory Visit (INDEPENDENT_AMBULATORY_CARE_PROVIDER_SITE_OTHER): Payer: Medicare Other | Admitting: Psychology

## 2019-03-13 ENCOUNTER — Telehealth: Payer: Self-pay | Admitting: *Deleted

## 2019-03-13 NOTE — Telephone Encounter (Signed)
Patient with diagnosis of atrial fibrillation on Eliquis for anticoagulation.    Procedure: spinal cord stimulator removal Date of procedure: TBD  CHADS2-VASc score of  5 (HTN, AGE x 2, CAD, female)  CrCl 67.3 Platelet count 321  Per office protocol, patient can hold Eliquis for 3 days prior to procedure.    Patient will not need bridging with Lovenox (enoxaparin) around procedure.

## 2019-03-13 NOTE — Telephone Encounter (Signed)
   [facility] Pre-operative Risk Assessment    Request for surgical clearance:  1. What type of surgery is being performed? SPINAL CORD STIMULATOR REMOVAL (THORACIC PADDLE and GENERATOR REMOVAL)   2. When is this surgery scheduled? TBD   3. What type of clearance is required (medical clearance vs. Pharmacy clearance to hold med vs. Both)? BOTH  4. Are there any medications that need to be held prior to surgery and how long? ELIQUIS AND ASA   5. Practice name and name of physician performing surgery?  [facility]; DR. Kristeen Miss   6. What is your office phone number [phone]    7.   What is your office fax number [phone]  8.   Anesthesia type (None, local, MAC, general) ? GENERAL    Julaine Hua 03/13/2019, 2:06 PM  _________________________________________________________________   (provider comments below)

## 2019-03-13 NOTE — Telephone Encounter (Signed)
   Primary Cardiologist: Sinclair Grooms, MD  Chart reviewed as part of pre-operative protocol coverage. Patient was contacted 03/13/2019 in reference to pre-operative risk assessment for pending surgery as outlined below.  STPHANIE BOLING was last seen on 01/07/2019 by [name].  Since that day, [name] has done well. She reports no chest pain, pressure. Reports her DOE is at baseline. Tells me her only concern is going back into atrial fibrillation during surgery, we discussed that her amiodarone would hopefully prevent that from happening. EKG 02/03/19 shows SR.  Therefore, based on ACC/AHA guidelines, the patient would be at acceptable risk for the planned procedure without further cardiovascular testing.   Per Elder Cyphers, RPH-CPP recommendations she may hold ELiquis for 3 days prior to procedure. She has a diagnosis of PAF on Eliquis for anticoagulation with CHADS2VASC score of 5 (HTN, AGEx2, CAD, female). She will not need bridging with Lovenox (enoxaparin) around procedure. Her CrCl 67.3 and Platelet count 321.  Her surgeon will instruct her when to resume Eliquis after procedure. She verbalized understanding of this.   I will route this recommendation to the requesting party via Epic fax function and remove from pre-op pool.  Please call with questions.  Loel Dubonnet, NP 03/13/2019, 2:26 PM

## 2019-03-17 ENCOUNTER — Other Ambulatory Visit: Payer: Self-pay

## 2019-03-17 ENCOUNTER — Ambulatory Visit (INDEPENDENT_AMBULATORY_CARE_PROVIDER_SITE_OTHER): Payer: Medicare Other | Admitting: Psychology

## 2019-03-17 ENCOUNTER — Telehealth (INDEPENDENT_AMBULATORY_CARE_PROVIDER_SITE_OTHER): Payer: Medicare Other | Admitting: Bariatrics

## 2019-03-17 ENCOUNTER — Encounter (INDEPENDENT_AMBULATORY_CARE_PROVIDER_SITE_OTHER): Payer: Self-pay | Admitting: Bariatrics

## 2019-03-17 DIAGNOSIS — I10 Essential (primary) hypertension: Secondary | ICD-10-CM | POA: Diagnosis not present

## 2019-03-17 DIAGNOSIS — Z6836 Body mass index (BMI) 36.0-36.9, adult: Secondary | ICD-10-CM | POA: Diagnosis not present

## 2019-03-17 DIAGNOSIS — E119 Type 2 diabetes mellitus without complications: Secondary | ICD-10-CM | POA: Diagnosis not present

## 2019-03-18 ENCOUNTER — Other Ambulatory Visit: Payer: Self-pay | Admitting: Neurological Surgery

## 2019-03-18 NOTE — Progress Notes (Signed)
Office: [phone]  /  Fax: [phone] TeleHealth Visit:  [name] has verbally consented to this TeleHealth visit today. The patient is located at home, the provider is located at the [facility] office. The participants in this visit include the listed provider and patient. The visit was conducted today via FaceTime.  HPI:  Chief Complaint: OBESITY [name] is here to discuss her progress with her obesity treatment plan. She is on the Category 2 plan with Category 1 and 2 breakfast options and states she is following her eating plan approximately 50% of the time. She states she is exercising 0 minutes 0 times per week.  [name] states that her weight remains the same. She has severe back pain and states that she has seen a neurosurgeon and continues to follow-up. She cannot walk or bend. She states she cannot focus on the program at this time.  Today's visit was #3  Starting weight: 212 lbs Starting date: 02/03/2019  Hypertension Marlinda has a diagnosis of hypertension and is taking diltiazem and Diovan. She has increased severe back pain and her blood pressure was high at her doctor's office last week.  Type II Diabetes Mellitus Without Complications [name] has a diagnosis of type II diabetes mellitus and is on no medications.  ASSESSMENT AND PLAN:  Type 2 diabetes mellitus without complication, without long-term current use of insulin (HCC)  Essential hypertension  Class 2 severe obesity with serious comorbidity and body mass index (BMI) of 36.0 to 36.9 in adult, unspecified obesity type ([place])  PLAN:  Hypertension [name] is working on healthy weight loss and exercise to improve blood pressure control. She will continue her medications. We will watch for signs of hypotension as she continues her lifestyle modifications.  Diabetes II Without Complications [name] has been given diabetes education by myself today. Good blood sugar control is important to  decrease the likelihood of diabetic complications such as nephropathy, neuropathy, limb loss, blindness, coronary artery disease, and death. Intensive lifestyle modification including diet, exercise and weight loss were discussed as the first line treatment for diabetes. [name] will decrease carbohydrates, increase protein and healthy fats.  Obesity [name] is currently in the action stage of change. As such, her goal is to continue with weight loss efforts. She has agreed to follow the Category 2 plan with Category 1 and 2 breakfast options. [name] will work on meal planning, increasing her water intake, and intentional eating. [name] has been instructed to work up to a goal of 150 minutes of combined cardio and strengthening exercise per week for weight loss and overall health benefits. We discussed the following Behavioral Modification Strategies today: increasing lean protein intake, decreasing simple carbohydrates, increasing vegetables, increase H20 intake, decrease eating out, no skipping meals, work on meal planning and easy cooking plans, and keeping healthy foods in the home.  [name] has agreed to follow-up with our clinic in 3 months. She was informed of the importance of frequent follow-up visits to maximize her success with intensive lifestyle modifications for her multiple health conditions.  ALLERGIES: Allergies  Allergen Reactions  . Oxycodone Other (See Comments)    Hallucinations   . Tramadol Other (See Comments)    Hallucinations  . Sulfa Antibiotics Itching    Vaginal itching  . Sulfacetamide Sodium Itching    Vaginal itching    MEDICATIONS: Current Outpatient Medications on File Prior to Visit  Medication Sig Dispense Refill  . acetaminophen (TYLENOL) 500 MG tablet Take 500 mg by mouth every  6 (six) hours as needed for mild pain or headache.     [name] amiodarone (PACERONE) 200 MG tablet Take 1 tablet (200 mg total) by mouth daily. 90 tablet 3  . aspirin EC 81 MG tablet Take  81 mg by mouth every Monday, Wednesday, and Friday. In the morning.    . clonazePAM (KLONOPIN) 0.5 MG tablet Take 0.25-0.5 mg by mouth 2 (two) times daily as needed for anxiety.    . Cyanocobalamin 1000 MCG/ML KIT Inject 1,000 mcg into the muscle every 30 (thirty) days. Take one (1) injection as directed every month.    . diltiazem (CARDIZEM CD) 180 MG 24 hr capsule Take 1 capsule (180 mg total) by mouth daily. 90 capsule 3  . ELIQUIS 5 MG TABS tablet TAKE ONE TABLET TWICE DAILY 60 tablet 5  . Evolocumab 140 MG/ML SOAJ Inject 140 mg into the skin every 14 (fourteen) days. 2 pen 11  . FLUoxetine (PROZAC) 10 MG tablet Take 10 mg by mouth daily.     . furosemide (LASIX) 40 MG tablet Take 1 tablet (40 mg total) by mouth daily. 90 tablet 3  . hydrocortisone (CORTEF) 20 MG tablet Take 20 mg by mouth 2 (two) times daily.    [name] levothyroxine (SYNTHROID, LEVOTHROID) 88 MCG tablet Take 1 tablet (88 mcg total) by mouth daily before breakfast. 30 tablet 0  . magnesium oxide (MAG-OX) 400 MG tablet Take 1 tablet (400 mg total) by mouth daily. 90 tablet 1  . mirabegron ER (MYRBETRIQ) 50 MG TB24 tablet Take 50 mg by mouth daily.    [name] NITROSTAT 0.4 MG SL tablet ONE TABLET UNDER TONGUE AS NEEDED FOR CHEST PAIN AS DIRECTED (Patient taking differently: Place 0.4 mg under the tongue every 5 (five) minutes as needed for chest pain. ) 25 tablet 6  . omeprazole (PRILOSEC) 20 MG capsule Take 20 mg by mouth daily.     . potassium chloride (K-DUR) 10 MEQ tablet Take 2 tablets (20 mEq total) by mouth 2 (two) times daily. 360 tablet 3  . rOPINIRole (REQUIP) 0.25 MG tablet Take 0.5 mg by mouth at bedtime.    . valsartan (DIOVAN) 80 MG tablet TAKE 1/2 TABLET EVERY MORNING AND TAKE ADDITIONAL ONE-HALF TABLET IN THE AFTERNOON FOR BLOOD PRESSURE > 140/80 90 tablet 3  . Vitamin D, Ergocalciferol, (DRISDOL) 50000 UNITS CAPS Take 50,000 Units by mouth 2 (two) times a week. Takes on Tuesdays and Fridays     No current  facility-administered medications on file prior to visit.    PAST MEDICAL HISTORY: Past Medical History:  Diagnosis Date  . Addison's disease ([place])   . Anxiety   . Arthritis    "everywhere"  . Atrial fibrillation (HCC)    Eliquis  . B12 deficiency   . Back pain   . CHF (congestive heart failure) ([place])   . Chronic bronchitis ([place])    "although I didn't get it this year" (07/23/2017)  . 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  last knee surgery dr. Elmyra Ricks gave large dose of hydrocortisal and avoided the hypotensive side effectas of addison's disease.  . Coronary artery disease   . Depression   . GERD (gastroesophageal reflux disease)   . Hyperlipemia   . Hypertension   . Hypothyroidism   . Lumbar radiculopathy 04/27/2014  . Migraine    "in my 20's" (07/23/2017)  . Osteoarthritis   . Palpitations   . Recurrent upper respiratory infection (URI)   .  SOB (shortness of breath)   . UTI (lower urinary tract infection) 07/09/2011  . Vitamin D deficiency     PAST SURGICAL HISTORY: Past Surgical History:  Procedure Laterality Date  . ACHILLES TENDON SURGERY Left 07/16/2014   bone spur removed  . ANTERIOR LUMBAR FUSION  1980   L4-5  . BACK SURGERY    . CARDIAC CATHETERIZATION  2013  . CARDIAC CATHETERIZATION N/A 09/02/2014   Procedure: Left Heart Cath and Coronary Angiography;  Surgeon: Wellington Hampshire, MD;  Location: [facility];  Service: Cardiovascular;  Laterality: N/A;  . CARDIOVERSION N/A 10/22/2015   Procedure: CARDIOVERSION;  Surgeon: Sanda Klein, MD;  Location: [facility];  Service: Cardiovascular;  Laterality: N/A;  . CARDIOVERSION N/A 06/07/2017   Procedure: CARDIOVERSION;  Surgeon: Larey Dresser, MD;  Location: [facility];  Service: Cardiovascular;  Laterality: N/A;  . CARDIOVERSION N/A 07/05/2017   Procedure: CARDIOVERSION;  Surgeon: Pixie Casino, MD;  Location: [facility]COPY;  Service: Cardiovascular;   Laterality: N/A;  . CARDIOVERSION N/A 09/30/2018   Procedure: CARDIOVERSION;  Surgeon: Buford Dresser, MD;  Location: [facility]OSCOPY;  Service: Cardiovascular;  Laterality: N/A;  . CARDIOVERSION N/A 11/26/2018   Procedure: CARDIOVERSION;  Surgeon: Sanda Klein, MD;  Location: [facility] ENDOSCOPY;  Service: Cardiovascular;  Laterality: N/A;  . CARPAL TUNNEL RELEASE Right   . CATARACT EXTRACTION, BILATERAL Bilateral   . COLON RESECTION N/A 03/20/2018   Procedure: LAPAROSCOPIC SIGMOIDECTOMY WITH COLOSTOMY;  Surgeon: Ileana Roup, MD;  Location: [facility];  Service: General;  Laterality: N/A;  . COLON SURGERY     laparascopic vs. open sigmoidectomy with cystoscopy  Dr. Dema Severin and Dr. Gloriann Loan 03-20-18  . COLONOSCOPY WITH PROPOFOL N/A 01/17/2018   Procedure: COLONOSCOPY WITH PROPOFOL;  Surgeon: Milus Banister, MD;  Location: [facility];  Service: Endoscopy;  Laterality: N/A;  . CORONARY ARTERY BYPASS GRAFT  06/26/2011   Procedure: CORONARY ARTERY BYPASS GRAFTING (CABG);  Surgeon: Melrose Nakayama, MD;  Location: [facility];  Service: Open Heart Surgery;  Laterality: N/A;  times using Greater Saphenous Vein Graft harvested endoscopically from right leg  . CYSTOSCOPY W/ URETERAL STENT PLACEMENT Bilateral 03/20/2018   Procedure: CYSTOSCOPY WITH BILATERAL RETROGRADE AND BILATERAL URETERAL CATHETER PLACEMENT;  Surgeon: Lucas Mallow, MD;  Location: [facility];  Service: Urology;  Laterality: Bilateral;  . DIAGNOSTIC LAPAROSCOPY    . DILATION AND CURETTAGE OF UTERUS    . JOINT REPLACEMENT    . KNEE ARTHROSCOPY Bilateral   . LAPAROSCOPIC CHOLECYSTECTOMY    . POLYPECTOMY  01/17/2018   Procedure: POLYPECTOMY;  Surgeon: Milus Banister, MD;  Location: [facility];  Service: Endoscopy;;  . REVISION TOTAL KNEE ARTHROPLASTY Right   . SPINAL CORD STIMULATOR IMPLANT  12/10/15  . TAKE DOWN OF INTESTINAL FISTULA N/A 03/20/2018   Procedure: LAPAROSCOPIC TAKE DOWN OF COLOVESICAL FISTULA;  Surgeon: Ileana Roup, MD;  Location: [facility];  Service: General;  Laterality: N/A;  . TONSILLECTOMY    . TOTAL KNEE ARTHROPLASTY Bilateral     SOCIAL HISTORY: Social History   Tobacco Use  . Smoking status: Passive Smoke Exposure - Never Smoker  . Smokeless tobacco: Never Used  . Tobacco comment: "husband passed in 2010; father passed 1978"  Substance Use Topics  . Alcohol use: Yes    Alcohol/week: 0.0 standard drinks    Comment: glass of wine occasionally   . Drug use: No    FAMILY HISTORY: Family History  Problem Relation Age of Onset  .  Heart attack Father   . Heart disease Father   . Hypertension Mother   . Stroke Mother   . Hypertension Maternal Grandfather   . Colon cancer Neg Hx    ROS: ROS none noted.  PHYSICAL EXAM: There were no vitals taken for this visit. There is no height or weight on file to calculate BMI. Physical Exam: Pt in no acute distress  RECENT LABS AND TESTS: BMET    Component Value Date/Time   NA 141 11/20/2018 0951   K 3.9 11/20/2018 0951   CL 99 11/20/2018 0951   CO2 25 11/20/2018 0951   GLUCOSE 169 (H) 11/20/2018 0951   GLUCOSE 143 (H) 10/07/2018 1157   BUN 16 11/20/2018 0951   CREATININE 1.03 (H) 11/20/2018 0951   CREATININE 0.80 12/07/2015 0933   CALCIUM 9.2 11/20/2018 0951   GFRNONAA 52 (L) 11/20/2018 0951   GFRAA 60 11/20/2018 0951   Lab Results  Component Value Date   HGBA1C 5.8 (H) 03/15/2018   No results found for: INSULIN CBC    Component Value Date/Time   WBC 10.4 11/20/2018 0951   WBC 9.7 04/02/2018 0611   RBC 4.63 11/20/2018 0951   RBC 4.01 04/02/2018 0611   HGB 13.3 11/20/2018 0951   HCT 40.8 11/20/2018 0951   PLT 321 11/20/2018 0951   MCV 88 11/20/2018 0951   MCH 28.7 11/20/2018 0951   MCH 29.9 04/02/2018 0611   MCHC 32.6 11/20/2018 0951   MCHC 31.9 04/02/2018 0611   RDW 14.0 11/20/2018 0951   LYMPHSABS 2.8 03/15/2018 1213   MONOABS 1.1 (H) 03/15/2018 1213   EOSABS 0.0 03/15/2018 1213   BASOSABS 0.1  03/15/2018 1213   Iron/TIBC/Ferritin/ %Sat No results found for: IRON, TIBC, FERRITIN, IRONPCTSAT Lipid Panel     Component Value Date/Time   CHOL 201 (H) 02/26/2019 0831   TRIG 93 02/26/2019 0831   HDL 53 02/26/2019 0831   CHOLHDL 3.8 02/26/2019 0831   CHOLHDL 2.5 09/02/2014 0346   VLDL 16 09/02/2014 0346   LDLCALC 131 (H) 02/26/2019 0831   LDLDIRECT 135 (H) 02/04/2018 1555   Hepatic Function Panel     Component Value Date/Time   PROT 6.0 11/20/2018 0951   ALBUMIN 3.9 11/20/2018 0951   AST 18 11/20/2018 0951   ALT 16 11/20/2018 0951   ALKPHOS 77 11/20/2018 0951   BILITOT 0.5 11/20/2018 0951   BILIDIR 0.18 09/26/2018 1109      Component Value Date/Time   TSH 2.710 11/20/2018 0951   TSH 0.282 (L) 09/25/2017 0709    [facility]HAV[facility]AL INTERVENTION VISIT DOCUMENTATION FOR INSURANCE (~15 minutes)  I, Michaelene Song, am acting as Location manager for [employer], DO  I have reviewed the above documentation for accuracy and completeness, and I agree with the above. Jearld Lesch, DO

## 2019-03-19 ENCOUNTER — Encounter: Payer: Self-pay | Admitting: Internal Medicine

## 2019-03-19 ENCOUNTER — Other Ambulatory Visit: Payer: Self-pay

## 2019-03-19 ENCOUNTER — Other Ambulatory Visit: Payer: Medicare Other

## 2019-03-19 ENCOUNTER — Ambulatory Visit: Payer: Medicare Other | Admitting: Internal Medicine

## 2019-03-19 VITALS — BP 100/78 | HR 80 | Ht 64.0 in | Wt 210.0 lb

## 2019-03-19 DIAGNOSIS — I480 Paroxysmal atrial fibrillation: Secondary | ICD-10-CM

## 2019-03-19 DIAGNOSIS — R0600 Dyspnea, unspecified: Secondary | ICD-10-CM | POA: Diagnosis not present

## 2019-03-19 NOTE — Progress Notes (Signed)
HPI Ms. Cogan returns today for followup of her atrial fib. She is a pleasant 78 yo woman with a h/o persistent atrial fib who had failed multiple medications who was placed on amiodarone and has maintained NSR. She continues to have some difficulty breathing. We thought that this was initially due to her atrial fib. She is maintaining NSR. She has not been coughing. She takes lasix 40 mg daily. She admits to some dietary indiscretion.  Allergies  Allergen Reactions  . Oxycodone Other (See Comments)    Hallucinations   . Tramadol Other (See Comments)    Hallucinations  . Sulfa Antibiotics Itching    Vaginal itching  . Sulfacetamide Sodium Itching    Vaginal itching     Current Outpatient Medications  Medication Sig Dispense Refill  . acetaminophen (TYLENOL) 500 MG tablet Take 500 mg by mouth every 6 (six) hours as needed for mild pain or headache.     [name] amiodarone (PACERONE) 200 MG tablet Take 1 tablet (200 mg total) by mouth daily. 90 tablet 3  . aspirin EC 81 MG tablet Take 81 mg by mouth every Monday, Wednesday, and Friday. In the morning.    . clonazePAM (KLONOPIN) 0.5 MG tablet Take 0.25-0.5 mg by mouth 2 (two) times daily as needed for anxiety.    . Cyanocobalamin 1000 MCG/ML KIT Inject 1,000 mcg into the muscle every 30 (thirty) days. Take one (1) injection as directed every month.    . diltiazem (CARDIZEM CD) 180 MG 24 hr capsule Take 1 capsule (180 mg total) by mouth daily. 90 capsule 3  . ELIQUIS 5 MG TABS tablet TAKE ONE TABLET TWICE DAILY 60 tablet 5  . Evolocumab 140 MG/ML SOAJ Inject 140 mg into the skin every 14 (fourteen) days. 2 pen 11  . FLUoxetine (PROZAC) 10 MG tablet Take 10 mg by mouth daily.     . furosemide (LASIX) 40 MG tablet Take 1 tablet (40 mg total) by mouth daily. 90 tablet 3  . hydrocortisone (CORTEF) 20 MG tablet Take 20 mg by mouth 2 (two) times daily.    [name] levothyroxine (SYNTHROID, LEVOTHROID) 88 MCG tablet Take 1 tablet (88 mcg total) by  mouth daily before breakfast. 30 tablet 0  . magnesium oxide (MAG-OX) 400 MG tablet Take 1 tablet (400 mg total) by mouth daily. 90 tablet 1  . mirabegron ER (MYRBETRIQ) 50 MG TB24 tablet Take 50 mg by mouth daily.    [name] NITROSTAT 0.4 MG SL tablet ONE TABLET UNDER TONGUE AS NEEDED FOR CHEST PAIN AS DIRECTED (Patient taking differently: Place 0.4 mg under the tongue every 5 (five) minutes as needed for chest pain. ) 25 tablet 6  . omeprazole (PRILOSEC) 20 MG capsule Take 20 mg by mouth daily.     . potassium chloride (K-DUR) 10 MEQ tablet Take 2 tablets (20 mEq total) by mouth 2 (two) times daily. 360 tablet 3  . rOPINIRole (REQUIP) 0.25 MG tablet Take 0.5 mg by mouth at bedtime.    . valsartan (DIOVAN) 80 MG tablet TAKE 1/2 TABLET EVERY MORNING AND TAKE ADDITIONAL ONE-HALF TABLET IN THE AFTERNOON FOR BLOOD PRESSURE > 140/80 90 tablet 3  . Vitamin D, Ergocalciferol, (DRISDOL) 50000 UNITS CAPS Take 50,000 Units by mouth 2 (two) times a week. Takes on Tuesdays and Fridays     No current facility-administered medications for this visit.     Past Medical History:  Diagnosis Date  . Addison's disease ([place])   . Anxiety   .  Arthritis    "everywhere"  . Atrial fibrillation ([facility])    Eliquis  . B12 deficiency   . Back pain   . CHF (congestive heart failure) ([place])   . Chronic bronchitis ([place])    "although I didn't get it this year" (07/23/2017)  . 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  last knee surgery dr. Elmyra Ricks gave large dose of hydrocortisal and avoided the hypotensive side effectas of addison's disease.  . Coronary artery disease   . Depression   . GERD (gastroesophageal reflux disease)   . Hyperlipemia   . Hypertension   . Hypothyroidism   . Lumbar radiculopathy 04/27/2014  . Migraine    "in my 20's" (07/23/2017)  . Osteoarthritis   . Palpitations   . Recurrent upper respiratory infection (URI)   . SOB (shortness of breath)   . UTI  (lower urinary tract infection) 07/09/2011  . Vitamin D deficiency     ROS:   All systems reviewed and negative except as noted in the HPI.   Past Surgical History:  Procedure Laterality Date  . ACHILLES TENDON SURGERY Left 07/16/2014   bone spur removed  . ANTERIOR LUMBAR FUSION  1980   L4-5  . BACK SURGERY    . CARDIAC CATHETERIZATION  2013  . CARDIAC CATHETERIZATION N/A 09/02/2014   Procedure: Left Heart Cath and Coronary Angiography;  Surgeon: Wellington Hampshire, MD;  Location: [facility];  Service: Cardiovascular;  Laterality: N/A;  . CARDIOVERSION N/A 10/22/2015   Procedure: CARDIOVERSION;  Surgeon: Sanda Klein, MD;  Location: [facility];  Service: Cardiovascular;  Laterality: N/A;  . CARDIOVERSION N/A 06/07/2017   Procedure: CARDIOVERSION;  Surgeon: Larey Dresser, MD;  Location: [facility] ENDOSCOPY;  Service: Cardiovascular;  Laterality: N/A;  . CARDIOVERSION N/A 07/05/2017   Procedure: CARDIOVERSION;  Surgeon: Pixie Casino, MD;  Location: [facility] ENDOSCOPY;  Service: Cardiovascular;  Laterality: N/A;  . CARDIOVERSION N/A 09/30/2018   Procedure: CARDIOVERSION;  Surgeon: Buford Dresser, MD;  Location: [facility] ENDOSCOPY;  Service: Cardiovascular;  Laterality: N/A;  . CARDIOVERSION N/A 11/26/2018   Procedure: CARDIOVERSION;  Surgeon: Sanda Klein, MD;  Location: [facility] ENDOSCOPY;  Service: Cardiovascular;  Laterality: N/A;  . CARPAL TUNNEL RELEASE Right   . CATARACT EXTRACTION, BILATERAL Bilateral   . COLON RESECTION N/A 03/20/2018   Procedure: LAPAROSCOPIC SIGMOIDECTOMY WITH COLOSTOMY;  Surgeon: Ileana Roup, MD;  Location: [facility];  Service: General;  Laterality: N/A;  . COLON SURGERY     laparascopic vs. open sigmoidectomy with cystoscopy  Dr. Dema Severin and Dr. Gloriann Loan 03-20-18  . COLONOSCOPY WITH PROPOFOL N/A 01/17/2018   Procedure: COLONOSCOPY WITH PROPOFOL;  Surgeon: Milus Banister, MD;  Location: [facility] ENDOSCOPY;  Service: Endoscopy;  Laterality: N/A;  . CORONARY ARTERY  BYPASS GRAFT  06/26/2011   Procedure: CORONARY ARTERY BYPASS GRAFTING (CABG);  Surgeon: Melrose Nakayama, MD;  Location: [facility];  Service: Open Heart Surgery;  Laterality: N/A;  times using Greater Saphenous Vein Graft harvested endoscopically from right leg  . CYSTOSCOPY W/ URETERAL STENT PLACEMENT Bilateral 03/20/2018   Procedure: CYSTOSCOPY WITH BILATERAL RETROGRADE AND BILATERAL URETERAL CATHETER PLACEMENT;  Surgeon: Lucas Mallow, MD;  Location: [facility];  Service: Urology;  Laterality: Bilateral;  . DIAGNOSTIC LAPAROSCOPY    . DILATION AND CURETTAGE OF UTERUS    . JOINT REPLACEMENT    . KNEE ARTHROSCOPY Bilateral   . LAPAROSCOPIC CHOLECYSTECTOMY    . POLYPECTOMY  01/17/2018   Procedure:  POLYPECTOMY;  Surgeon: Milus Banister, MD;  Location: [facility];  Service: Endoscopy;;  . REVISION TOTAL KNEE ARTHROPLASTY Right   . SPINAL CORD STIMULATOR IMPLANT  12/10/15  . TAKE DOWN OF INTESTINAL FISTULA N/A 03/20/2018   Procedure: LAPAROSCOPIC TAKE DOWN OF COLOVESICAL FISTULA;  Surgeon: Ileana Roup, MD;  Location: [facility];  Service: General;  Laterality: N/A;  . TONSILLECTOMY    . TOTAL KNEE ARTHROPLASTY Bilateral      Family History  Problem Relation Age of Onset  . Heart attack Father   . Heart disease Father   . Hypertension Mother   . Stroke Mother   . Hypertension Maternal Grandfather   . Colon cancer Neg Hx      Social History   Socioeconomic History  . Marital status: Widowed    Spouse name: Not on file  . Number of children: Not on file  . Years of education: Not on file  . Highest education level: Not on file  Occupational History  . Occupation: Retired Pharmacist, hospital  Tobacco Use  . Smoking status: Passive Smoke Exposure - Never Smoker  . Smokeless tobacco: Never Used  . Tobacco comment: "husband passed in 2010; father passed 1978"  Substance and Sexual Activity  . Alcohol use: Yes    Alcohol/week: 0.0 standard drinks    Comment: glass of wine occasionally    . Drug use: No  . Sexual activity: Not Currently    Birth control/protection: Post-menopausal  Other Topics Concern  . Not on file  Social History Narrative  . Not on file   Social Determinants of Health   Financial Resource Strain:   . Difficulty of Paying Living Expenses: Not on file  Food Insecurity:   . Worried About Charity fundraiser in the Last Year: Not on file  . Ran Out of Food in the Last Year: Not on file  Transportation Needs:   . Lack of Transportation (Medical): Not on file  . Lack of Transportation (Non-Medical): Not on file  Physical Activity:   . Days of Exercise per Week: Not on file  . Minutes of Exercise per Session: Not on file  Stress:   . Feeling of Stress : Not on file  Social Connections:   . Frequency of Communication with Friends and Family: Not on file  . Frequency of Social Gatherings with Friends and Family: Not on file  . Attends Religious Services: Not on file  . Active Member of Clubs or Organizations: Not on file  . Attends Archivist Meetings: Not on file  . Marital Status: Not on file  Intimate Partner Violence:   . Fear of Current or Ex-Partner: Not on file  . Emotionally Abused: Not on file  . Physically Abused: Not on file  . Sexually Abused: Not on file     BP 100/78   Pulse 80   Ht '5\' 4"'  (1.626 m)   Wt 210 lb (95.3 kg)   SpO2 97%   BMI 36.05 kg/m   Physical Exam:  Well appearing NAD HEENT: Unremarkable Neck:  No JVD, no thyromegally Lymphatics:  No adenopathy Back:  No CVA tenderness Lungs:  Clear with no wheezes HEART:  Regular rate rhythm, no murmurs, no rubs, no clicks Abd:  soft, positive bowel sounds, no organomegally, no rebound, no guarding Ext:  2 plus pulses, no edema, no cyanosis, no clubbing Skin:  No rashes no nodules Neuro:  CN II through XII intact, motor grossly intact  EKG -  nsr  Assess/Plan: 1. Persistent atrial fib - she is maintaining NSR on amiodarone 200 mg daily. 2. Dyspnea -  unclear of the etiology. She will take her lasix 40 mg bid for 4 days. If the dyspnea is improved then she will remain on 40 bid. If no better then she will go back to 40 mg daily. She will obtain PFT's with a DLCO. 3. Coags - she has not had any bleeding on Eliquis. I spent over 25 minutes including 50% face to face time with the patient.  Mikle Bosworth.D.

## 2019-03-19 NOTE — Patient Instructions (Addendum)
Medication Instructions:  Your physician has recommended you make the following change in your medication:   Increase your Lasix to 40mg  twice daily x 4 days.  If your breathing has improved then continue that dose.  If breathing has not improved then return to Lasix 40mg  once daily.   Lab Work:  BMP one week if your breathing is better  If you have labs (blood work) drawn today and your tests are completely normal, you will receive your results only by: [name] MyChart Message (if you have MyChart) OR . A paper copy in the mail If you have any lab test that is abnormal or we need to change your treatment, we will call you to review the results.  Testing/Procedures:   Your physician has recommended that you have a pulmonary function test. Pulmonary Function Tests are a group of tests that measure how well air moves in and out of your lungs.    Please schedule for a Pulmonary Function Test     Follow-Up: At [facility], you and your health needs are our priority.  As part of our continuing mission to provide you with exceptional heart care, we have created designated Provider Care Teams.  These Care Teams include your primary Cardiologist (physician) and Advanced Practice Providers (APPs -  Physician Assistants and Nurse Practitioners) who all work together to provide you with the care you need, when you need it.  Your next appointment:  05/05/2019 1215pm with Dr Lovena Le

## 2019-03-24 ENCOUNTER — Telehealth: Payer: Self-pay

## 2019-03-24 DIAGNOSIS — R0600 Dyspnea, unspecified: Secondary | ICD-10-CM

## 2019-03-24 MED ORDER — FUROSEMIDE 40 MG PO TABS: 40.0000 mg | ORAL_TABLET | Freq: Two times a day (BID) | ORAL | 3 refills | Status: DC

## 2019-03-24 NOTE — Telephone Encounter (Signed)
Spoke with pt who states SOB has improved after taking the Lasix 40mg  bid x 4 days.  Pt states she has lost a total of 8lbs.  Pt states she would like to stay on  the current dose of Lasix 40 mg bid.  Pt advised she will need lab work (BMP) per Dr Lovena Le.  Lab appointment scheduled for 04/01/2019.  Pt may come anytime between 8am and 430pm.  Order sent to pt's pharmacy Lasix 40mg  bid.  Pt verbalizes understanding and agrees with plan.

## 2019-03-24 NOTE — Telephone Encounter (Signed)
-----   Message from Thora Lance, RN sent at 03/20/2019 10:22 AM EST ----- Regarding: pt is going to try Lasix and if likes will need a BMP

## 2019-04-01 ENCOUNTER — Other Ambulatory Visit: Payer: Medicare Other | Admitting: *Deleted

## 2019-04-01 ENCOUNTER — Telehealth: Payer: Self-pay

## 2019-04-01 ENCOUNTER — Other Ambulatory Visit: Payer: Self-pay

## 2019-04-01 DIAGNOSIS — R0600 Dyspnea, unspecified: Secondary | ICD-10-CM

## 2019-04-01 DIAGNOSIS — I10 Essential (primary) hypertension: Secondary | ICD-10-CM

## 2019-04-01 DIAGNOSIS — E876 Hypokalemia: Secondary | ICD-10-CM

## 2019-04-01 LAB — BASIC METABOLIC PANEL
BUN/Creatinine Ratio: 25 (ref 12–28)
BUN: 21 mg/dL (ref 8–27)
CO2: 29 mmol/L (ref 20–29)
Calcium: 8.7 mg/dL (ref 8.7–10.3)
Chloride: 95 mmol/L — ABNORMAL LOW (ref 96–106)
Creatinine, Ser: 0.84 mg/dL (ref 0.57–1.00)
GFR calc Af Amer: 76 mL/min/{1.73_m2} (ref >59)
GFR calc non Af Amer: 66 mL/min/{1.73_m2} (ref >59)
Glucose: 125 mg/dL — ABNORMAL HIGH (ref 65–99)
Potassium: 2.8 mmol/L — CL (ref 3.5–5.2)
Sodium: 142 mmol/L (ref 134–144)

## 2019-04-01 NOTE — Telephone Encounter (Signed)
[facility] called to report critical lab. Potassium 2.8  Will consult DOD, Dr. Caryl Comes.

## 2019-04-01 NOTE — Telephone Encounter (Signed)
Spoke with Dr. Caryl Comes, DOD.  Pt to hold lasix, repeat labs on 12/31.  Pt to take dose of potassium 39meq now and then another dose before bed tonight.  Tomorrow 68meq TID.  Pt advised of MD instructions.  Pt indicates she has been fatigued today and had some dizziness.

## 2019-04-02 ENCOUNTER — Telehealth: Payer: Self-pay

## 2019-04-02 NOTE — Addendum Note (Signed)
Addended by: Willeen Cass A on: 04/02/2019 02:52 PM   Modules accepted: Orders

## 2019-04-02 NOTE — Telephone Encounter (Signed)
Call placed to Pt.  Pt had been given instruction yesterday by DOD.  Will follow DOD instruction.  Pt will have repeat potassium 12/31 am and changed order to stat.  Will call pt tomorrow with further instructions.   PHARMACY-Pt states she is changing insurance coverage April 04, 2019 and needs assistance getting Repatha approved through new insurance.  States she had spoken with Jinny Blossom before who had helped her. Will forward to pharmacy for assistance with [place].

## 2019-04-02 NOTE — Telephone Encounter (Signed)
-----   Message from Evans Lance, MD sent at 04/02/2019  2:30 PM EST ----- Potassium 40 meq daily and repeat in 2 weeks.

## 2019-04-02 NOTE — Telephone Encounter (Signed)
Please follow up as possible.

## 2019-04-02 NOTE — Telephone Encounter (Signed)
Instructed the pt to bring in insurance card. The pt voiced understanding and stated that she would bring it in tomorrow so that I may start the pa.

## 2019-04-03 ENCOUNTER — Other Ambulatory Visit: Payer: Medicare Other | Admitting: *Deleted

## 2019-04-03 ENCOUNTER — Other Ambulatory Visit: Payer: Self-pay

## 2019-04-03 DIAGNOSIS — E876 Hypokalemia: Secondary | ICD-10-CM

## 2019-04-03 LAB — BASIC METABOLIC PANEL
BUN/Creatinine Ratio: 21 (ref 12–28)
BUN: 21 mg/dL (ref 8–27)
CO2: 31 mmol/L — ABNORMAL HIGH (ref 20–29)
Calcium: 8.8 mg/dL (ref 8.7–10.3)
Chloride: 100 mmol/L (ref 96–106)
Creatinine, Ser: 0.99 mg/dL (ref 0.57–1.00)
GFR calc Af Amer: 63 mL/min/{1.73_m2} (ref >59)
GFR calc non Af Amer: 54 mL/min/{1.73_m2} — ABNORMAL LOW (ref >59)
Glucose: 153 mg/dL — ABNORMAL HIGH (ref 65–99)
Potassium: 3.5 mmol/L (ref 3.5–5.2)
Sodium: 140 mmol/L (ref 134–144)

## 2019-04-07 ENCOUNTER — Telehealth: Payer: Self-pay

## 2019-04-07 ENCOUNTER — Other Ambulatory Visit (HOSPITAL_COMMUNITY)
Admission: RE | Admit: 2019-04-07 | Discharge: 2019-04-07 | Disposition: A | Discharge status: Home / Self Care | Payer: Medicare Other | Source: Ambulatory Visit | Attending: Neurological Surgery | Admitting: Neurological Surgery

## 2019-04-07 DIAGNOSIS — Z20822 Contact with and (suspected) exposure to covid-19: Secondary | ICD-10-CM | POA: Diagnosis not present

## 2019-04-07 DIAGNOSIS — Z01812 Encounter for preprocedural laboratory examination: Secondary | ICD-10-CM | POA: Diagnosis present

## 2019-04-07 DIAGNOSIS — E876 Hypokalemia: Secondary | ICD-10-CM

## 2019-04-07 NOTE — Telephone Encounter (Signed)
Call placed to Pt.  Pt has been taking lasix once a day over the weekend.  Pt with increased edema and sob.  Advised Pt to resume her lasix 40 mg bid.  Advised to take potassium 20 meq bid with her lasix.  Explained lasix mechanism of action.  Encourage Pt to also increase potassium in her diet.  Pt has procedure on Thursday.  Will discuss with Dr. Lovena Le when we should check her blood work again and will call her back.  Pt thanked nurse for call.

## 2019-04-07 NOTE — Telephone Encounter (Signed)
Pt called in requesting to know what the results of her lab work was. Pt as a little frantic. I called Willeen Cass rn and the nurse stated that her potassium was fine and that she would call her later today. I instructed the pt that as well and assured her that the nure will call her asap. Pt voiced understanding

## 2019-04-08 ENCOUNTER — Other Ambulatory Visit: Payer: Self-pay

## 2019-04-08 ENCOUNTER — Encounter (HOSPITAL_COMMUNITY): Payer: Self-pay | Admitting: Neurological Surgery

## 2019-04-08 LAB — NOVEL CORONAVIRUS, NAA (HOSP ORDER, SEND-OUT TO REF LAB; TAT 18-24 HRS): SARS-CoV-2, NAA: NOT DETECTED

## 2019-04-08 NOTE — Progress Notes (Signed)
Spoke with pt for pre-op call. Pt has an extensive cardiac history with CABG, A-fib, CHF. Pt's cardiologist is Dr. Daneen Schick. Cardiac clearance in Epic dated 03/13/19. Pt was instructed to hold Eliquis 3 days prior to surgery and Aspirin 5 days prior. Last dose for Eliquis was 04/07/19 and last dose for Aspirin was 04/05/19. Pt states she is not diabetic. 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  Pt had her Covid test done on 04/07/19 and it is negative. Pt states she has been in quarantine since the test was done and will continue until day of surgery.

## 2019-04-10 ENCOUNTER — Ambulatory Visit (HOSPITAL_COMMUNITY): Payer: Medicare PPO | Admitting: Anesthesiology

## 2019-04-10 ENCOUNTER — Other Ambulatory Visit: Payer: Self-pay

## 2019-04-10 ENCOUNTER — Encounter (HOSPITAL_COMMUNITY)
Admission: RE | Disposition: A | Discharge status: Home / Self Care | Payer: Self-pay | Source: Home / Self Care | Attending: Neurological Surgery

## 2019-04-10 ENCOUNTER — Encounter (HOSPITAL_COMMUNITY): Payer: Self-pay | Admitting: Neurological Surgery

## 2019-04-10 ENCOUNTER — Ambulatory Visit (HOSPITAL_COMMUNITY)
Admission: RE | Admit: 2019-04-10 | Discharge: 2019-04-10 | Disposition: A | Discharge status: Home / Self Care | Payer: Medicare PPO | Attending: Neurological Surgery | Admitting: Neurological Surgery

## 2019-04-10 DIAGNOSIS — Z7901 Long term (current) use of anticoagulants: Secondary | ICD-10-CM | POA: Insufficient documentation

## 2019-04-10 DIAGNOSIS — I2510 Atherosclerotic heart disease of native coronary artery without angina pectoris: Secondary | ICD-10-CM | POA: Insufficient documentation

## 2019-04-10 DIAGNOSIS — E271 Primary adrenocortical insufficiency: Secondary | ICD-10-CM | POA: Insufficient documentation

## 2019-04-10 DIAGNOSIS — I4891 Unspecified atrial fibrillation: Secondary | ICD-10-CM | POA: Insufficient documentation

## 2019-04-10 DIAGNOSIS — M1990 Unspecified osteoarthritis, unspecified site: Secondary | ICD-10-CM | POA: Insufficient documentation

## 2019-04-10 DIAGNOSIS — Z96653 Presence of artificial knee joint, bilateral: Secondary | ICD-10-CM | POA: Diagnosis not present

## 2019-04-10 DIAGNOSIS — Z9049 Acquired absence of other specified parts of digestive tract: Secondary | ICD-10-CM | POA: Diagnosis not present

## 2019-04-10 DIAGNOSIS — G4730 Sleep apnea, unspecified: Secondary | ICD-10-CM | POA: Insufficient documentation

## 2019-04-10 DIAGNOSIS — E559 Vitamin D deficiency, unspecified: Secondary | ICD-10-CM | POA: Insufficient documentation

## 2019-04-10 DIAGNOSIS — Z933 Colostomy status: Secondary | ICD-10-CM | POA: Diagnosis not present

## 2019-04-10 DIAGNOSIS — I509 Heart failure, unspecified: Secondary | ICD-10-CM | POA: Diagnosis not present

## 2019-04-10 DIAGNOSIS — E785 Hyperlipidemia, unspecified: Secondary | ICD-10-CM | POA: Diagnosis not present

## 2019-04-10 DIAGNOSIS — F329 Major depressive disorder, single episode, unspecified: Secondary | ICD-10-CM | POA: Diagnosis not present

## 2019-04-10 DIAGNOSIS — K219 Gastro-esophageal reflux disease without esophagitis: Secondary | ICD-10-CM | POA: Insufficient documentation

## 2019-04-10 DIAGNOSIS — I110 Hypertensive heart disease with heart failure: Secondary | ICD-10-CM | POA: Diagnosis not present

## 2019-04-10 DIAGNOSIS — T85113A Breakdown (mechanical) of implanted electronic neurostimulator, generator, initial encounter: Secondary | ICD-10-CM | POA: Diagnosis not present

## 2019-04-10 DIAGNOSIS — Z79890 Hormone replacement therapy (postmenopausal): Secondary | ICD-10-CM | POA: Insufficient documentation

## 2019-04-10 DIAGNOSIS — Z7982 Long term (current) use of aspirin: Secondary | ICD-10-CM | POA: Diagnosis not present

## 2019-04-10 DIAGNOSIS — Z951 Presence of aortocoronary bypass graft: Secondary | ICD-10-CM | POA: Diagnosis not present

## 2019-04-10 DIAGNOSIS — Y752 Prosthetic and other implants, materials and neurological devices associated with adverse incidents: Secondary | ICD-10-CM | POA: Insufficient documentation

## 2019-04-10 DIAGNOSIS — F419 Anxiety disorder, unspecified: Secondary | ICD-10-CM | POA: Diagnosis not present

## 2019-04-10 DIAGNOSIS — E039 Hypothyroidism, unspecified: Secondary | ICD-10-CM | POA: Diagnosis not present

## 2019-04-10 DIAGNOSIS — Z79899 Other long term (current) drug therapy: Secondary | ICD-10-CM | POA: Insufficient documentation

## 2019-04-10 HISTORY — DX: Sleep apnea, unspecified: G47.30

## 2019-04-10 HISTORY — DX: Colostomy status: Z93.3

## 2019-04-10 HISTORY — PX: SPINAL CORD STIMULATOR REMOVAL: SHX5379

## 2019-04-10 LAB — PROTIME-INR
INR: 1 (ref 0.8–1.2)
Prothrombin Time: 12.9 seconds (ref 11.4–15.2)

## 2019-04-10 SURGERY — LUMBAR SPINAL CORD STIMULATOR REMOVAL
Anesthesia: General

## 2019-04-10 MED ORDER — ACETAMINOPHEN 325 MG PO TABS: 650.0000 mg | ORAL_TABLET | ORAL | Status: DC | PRN

## 2019-04-10 MED ORDER — PROPOFOL 10 MG/ML IV BOLUS
INTRAVENOUS | Status: DC | PRN
  Administered 2019-04-10: 100 mg via INTRAVENOUS

## 2019-04-10 MED ORDER — SUGAMMADEX SODIUM 200 MG/2ML IV SOLN
INTRAVENOUS | Status: DC | PRN
  Administered 2019-04-10: 200 mg via INTRAVENOUS

## 2019-04-10 MED ORDER — ONDANSETRON HCL 4 MG/2ML IJ SOLN
INTRAMUSCULAR | Status: AC
  Filled 2019-04-10: qty 2

## 2019-04-10 MED ORDER — ONDANSETRON HCL 4 MG/2ML IJ SOLN
INTRAMUSCULAR | Status: DC | PRN
  Administered 2019-04-10: 4 mg via INTRAVENOUS

## 2019-04-10 MED ORDER — FLEET ENEMA 7-19 GM/118ML RE ENEM: 1.0000 | ENEMA | Freq: Once | RECTAL | Status: DC | PRN

## 2019-04-10 MED ORDER — CHLORHEXIDINE GLUCONATE CLOTH 2 % EX PADS: 6.0000 | MEDICATED_PAD | Freq: Once | CUTANEOUS | Status: DC

## 2019-04-10 MED ORDER — DILTIAZEM HCL ER COATED BEADS 180 MG PO CP24: 180.0000 mg | ORAL_CAPSULE | Freq: Every day | ORAL | Status: DC

## 2019-04-10 MED ORDER — FLUOXETINE HCL 20 MG PO TABS: 10.0000 mg | ORAL_TABLET | Freq: Every day | ORAL | Status: DC

## 2019-04-10 MED ORDER — PHENOL 1.4 % MT LIQD: 1.0000 | OROMUCOSAL | Status: DC | PRN

## 2019-04-10 MED ORDER — FENTANYL CITRATE (PF) 250 MCG/5ML IJ SOLN
INTRAMUSCULAR | Status: AC
  Filled 2019-04-10: qty 5

## 2019-04-10 MED ORDER — EPHEDRINE SULFATE 50 MG/ML IJ SOLN
INTRAMUSCULAR | Status: DC | PRN
  Administered 2019-04-10: 5 mg via INTRAVENOUS

## 2019-04-10 MED ORDER — ONDANSETRON HCL 4 MG/2ML IJ SOLN: 4.0000 mg | Freq: Four times a day (QID) | INTRAMUSCULAR | Status: DC | PRN

## 2019-04-10 MED ORDER — THROMBIN 5000 UNITS EX SOLR
CUTANEOUS | Status: AC
  Filled 2019-04-10: qty 5000

## 2019-04-10 MED ORDER — HYDROCODONE-ACETAMINOPHEN 5-325 MG PO TABS: 1.0000 | ORAL_TABLET | Freq: Four times a day (QID) | ORAL | 0 refills | Status: DC | PRN

## 2019-04-10 MED ORDER — KETOROLAC TROMETHAMINE 15 MG/ML IJ SOLN: 7.5000 mg | Freq: Four times a day (QID) | INTRAMUSCULAR | Status: DC

## 2019-04-10 MED ORDER — AMIODARONE HCL 200 MG PO TABS: 200.0000 mg | ORAL_TABLET | Freq: Every day | ORAL | Status: DC

## 2019-04-10 MED ORDER — LIDOCAINE 2% (20 MG/ML) 5 ML SYRINGE
INTRAMUSCULAR | Status: AC
  Filled 2019-04-10: qty 5

## 2019-04-10 MED ORDER — ONDANSETRON HCL 4 MG PO TABS: 4.0000 mg | ORAL_TABLET | Freq: Four times a day (QID) | ORAL | Status: DC | PRN

## 2019-04-10 MED ORDER — BUPIVACAINE HCL (PF) 0.5 % IJ SOLN
INTRAMUSCULAR | Status: DC | PRN
  Administered 2019-04-10: 10 mL
  Administered 2019-04-10: 20 mL

## 2019-04-10 MED ORDER — SODIUM CHLORIDE 0.9 % IV SOLN: 250.0000 mL | INTRAVENOUS | Status: DC

## 2019-04-10 MED ORDER — THROMBIN 5000 UNITS EX SOLR: OROMUCOSAL | Status: DC | PRN

## 2019-04-10 MED ORDER — LIDOCAINE HCL (CARDIAC) PF 100 MG/5ML IV SOSY
PREFILLED_SYRINGE | INTRAVENOUS | Status: DC | PRN
  Administered 2019-04-10: 60 mg via INTRAVENOUS

## 2019-04-10 MED ORDER — ROCURONIUM BROMIDE 100 MG/10ML IV SOLN
INTRAVENOUS | Status: DC | PRN
  Administered 2019-04-10: 50 mg via INTRAVENOUS

## 2019-04-10 MED ORDER — SENNA 8.6 MG PO TABS: 1.0000 | ORAL_TABLET | Freq: Two times a day (BID) | ORAL | Status: DC

## 2019-04-10 MED ORDER — HYDROCODONE-ACETAMINOPHEN 5-325 MG PO TABS
1.0000 | ORAL_TABLET | Freq: Once | ORAL | Status: AC
  Administered 2019-04-10: 1 via ORAL

## 2019-04-10 MED ORDER — HYDROCORTISONE 20 MG PO TABS: 20.0000 mg | ORAL_TABLET | Freq: Two times a day (BID) | ORAL | Status: DC

## 2019-04-10 MED ORDER — FENTANYL CITRATE (PF) 100 MCG/2ML IJ SOLN: 25.0000 ug | INTRAMUSCULAR | Status: DC | PRN

## 2019-04-10 MED ORDER — DEXAMETHASONE SODIUM PHOSPHATE 10 MG/ML IJ SOLN
INTRAMUSCULAR | Status: DC | PRN
  Administered 2019-04-10: 10 mg via INTRAVENOUS

## 2019-04-10 MED ORDER — LIDOCAINE-EPINEPHRINE 1 %-1:100000 IJ SOLN
INTRAMUSCULAR | Status: AC
  Filled 2019-04-10: qty 1

## 2019-04-10 MED ORDER — DOCUSATE SODIUM 100 MG PO CAPS: 100.0000 mg | ORAL_CAPSULE | Freq: Two times a day (BID) | ORAL | Status: DC

## 2019-04-10 MED ORDER — PROPOFOL 10 MG/ML IV BOLUS
INTRAVENOUS | Status: AC
  Filled 2019-04-10: qty 20

## 2019-04-10 MED ORDER — POLYETHYLENE GLYCOL 3350 17 G PO PACK: 17.0000 g | PACK | Freq: Every day | ORAL | Status: DC | PRN

## 2019-04-10 MED ORDER — SODIUM CHLORIDE 0.9% FLUSH: 3.0000 mL | INTRAVENOUS | Status: DC | PRN

## 2019-04-10 MED ORDER — CEFAZOLIN SODIUM-DEXTROSE 2-4 GM/100ML-% IV SOLN
INTRAVENOUS | Status: AC
  Filled 2019-04-10: qty 100

## 2019-04-10 MED ORDER — HYDROCODONE-ACETAMINOPHEN 5-325 MG PO TABS
ORAL_TABLET | ORAL | Status: AC
  Filled 2019-04-10: qty 1

## 2019-04-10 MED ORDER — MENTHOL 3 MG MT LOZG: 1.0000 | LOZENGE | OROMUCOSAL | Status: DC | PRN

## 2019-04-10 MED ORDER — BUPIVACAINE HCL (PF) 0.5 % IJ SOLN
INTRAMUSCULAR | Status: AC
  Filled 2019-04-10: qty 30

## 2019-04-10 MED ORDER — ACETAMINOPHEN 650 MG RE SUPP: 650.0000 mg | RECTAL | Status: DC | PRN

## 2019-04-10 MED ORDER — CEFAZOLIN SODIUM-DEXTROSE 2-4 GM/100ML-% IV SOLN: 2.0000 g | Freq: Three times a day (TID) | INTRAVENOUS | Status: DC

## 2019-04-10 MED ORDER — LIDOCAINE-EPINEPHRINE 1 %-1:100000 IJ SOLN
INTRAMUSCULAR | Status: DC | PRN
  Administered 2019-04-10: 10 mL

## 2019-04-10 MED ORDER — CLONAZEPAM 0.5 MG PO TABS: 0.2500 mg | ORAL_TABLET | Freq: Two times a day (BID) | ORAL | Status: DC | PRN

## 2019-04-10 MED ORDER — CEFAZOLIN SODIUM-DEXTROSE 2-4 GM/100ML-% IV SOLN
2.0000 g | INTRAVENOUS | Status: AC
  Administered 2019-04-10: 2 g via INTRAVENOUS

## 2019-04-10 MED ORDER — SODIUM CHLORIDE 0.9% FLUSH: 3.0000 mL | Freq: Two times a day (BID) | INTRAVENOUS | Status: DC

## 2019-04-10 MED ORDER — POTASSIUM CHLORIDE ER 10 MEQ PO TBCR: 20.0000 meq | EXTENDED_RELEASE_TABLET | Freq: Three times a day (TID) | ORAL | Status: DC

## 2019-04-10 MED ORDER — BISACODYL 10 MG RE SUPP: 10.0000 mg | Freq: Every day | RECTAL | Status: DC | PRN

## 2019-04-10 MED ORDER — FENTANYL CITRATE (PF) 100 MCG/2ML IJ SOLN
INTRAMUSCULAR | Status: DC | PRN
  Administered 2019-04-10: 25 ug via INTRAVENOUS
  Administered 2019-04-10: 100 ug via INTRAVENOUS

## 2019-04-10 MED ORDER — LACTATED RINGERS IV SOLN: INTRAVENOUS | Status: DC | PRN

## 2019-04-10 MED ORDER — SODIUM CHLORIDE 0.9 % IV SOLN
INTRAVENOUS | Status: DC | PRN
  Administered 2019-04-10: 500 mL

## 2019-04-10 MED ORDER — ACETAMINOPHEN 500 MG PO TABS: 500.0000 mg | ORAL_TABLET | Freq: Four times a day (QID) | ORAL | Status: DC | PRN

## 2019-04-10 MED ORDER — LEVOTHYROXINE SODIUM 88 MCG PO TABS: 88.0000 ug | ORAL_TABLET | Freq: Every day | ORAL | Status: DC

## 2019-04-10 MED ORDER — IRBESARTAN 75 MG PO TABS: 75.0000 mg | ORAL_TABLET | Freq: Every day | ORAL | Status: DC

## 2019-04-10 SURGICAL SUPPLY — 50 items
ACC NRSTM 4 TRQ WRNCH STRL (MISCELLANEOUS) ×1
ADH SKN CLS APL DERMABOND .7 (GAUZE/BANDAGES/DRESSINGS) ×2
APL SKNCLS STERI-STRIP NONHPOA (GAUZE/BANDAGES/DRESSINGS)
BAG DECANTER FOR FLEXI CONT (MISCELLANEOUS) ×2 IMPLANT
BENZOIN TINCTURE PRP APPL 2/3 (GAUZE/BANDAGES/DRESSINGS) IMPLANT
BLADE CLIPPER SURG (BLADE) IMPLANT
CARTRIDGE OIL MAESTRO DRILL (MISCELLANEOUS) ×1 IMPLANT
COVER WAND RF STERILE (DRAPES) ×2 IMPLANT
DECANTER SPIKE VIAL GLASS SM (MISCELLANEOUS) ×2 IMPLANT
DERMABOND ADVANCED (GAUZE/BANDAGES/DRESSINGS) ×2
DERMABOND ADVANCED .7 DNX12 (GAUZE/BANDAGES/DRESSINGS) ×2 IMPLANT
DIFFUSER DRILL AIR PNEUMATIC (MISCELLANEOUS) ×2 IMPLANT
DRAPE LAPAROTOMY 100X72X124 (DRAPES) ×2 IMPLANT
DURAPREP 26ML APPLICATOR (WOUND CARE) ×2 IMPLANT
ELECT REM PT RETURN 9FT ADLT (ELECTROSURGICAL) ×2
ELECTRODE REM PT RTRN 9FT ADLT (ELECTROSURGICAL) ×1 IMPLANT
GAUZE 4X4 16PLY RFD (DISPOSABLE) IMPLANT
GLOVE BIO SURGEON STRL SZ7 (GLOVE) ×4 IMPLANT
GLOVE BIOGEL PI IND STRL 8.5 (GLOVE) ×1 IMPLANT
GLOVE BIOGEL PI INDICATOR 8.5 (GLOVE) ×1
GLOVE ECLIPSE 8.5 STRL (GLOVE) ×2 IMPLANT
GLOVE EXAM NITRILE XL STR (GLOVE) IMPLANT
GLOVE SURG SS PI 7.5 STRL IVOR (GLOVE) ×2 IMPLANT
GOWN STRL REUS W/ TWL LRG LVL3 (GOWN DISPOSABLE) ×1 IMPLANT
GOWN STRL REUS W/ TWL XL LVL3 (GOWN DISPOSABLE) ×1 IMPLANT
GOWN STRL REUS W/TWL 2XL LVL3 (GOWN DISPOSABLE) ×2 IMPLANT
GOWN STRL REUS W/TWL LRG LVL3 (GOWN DISPOSABLE) ×2
GOWN STRL REUS W/TWL XL LVL3 (GOWN DISPOSABLE) ×2
KIT BASIN OR (CUSTOM PROCEDURE TRAY) ×2 IMPLANT
KIT NEURO ACCESSORY W/WRENCH (MISCELLANEOUS) ×2 IMPLANT
KIT TURNOVER KIT B (KITS) ×2 IMPLANT
NEEDLE HYPO 22GX1.5 SAFETY (NEEDLE) ×2 IMPLANT
NS IRRIG 1000ML POUR BTL (IV SOLUTION) ×2 IMPLANT
OIL CARTRIDGE MAESTRO DRILL (MISCELLANEOUS) ×2
PACK LAMINECTOMY NEURO (CUSTOM PROCEDURE TRAY) ×2 IMPLANT
PAD ARMBOARD 7.5X6 YLW CONV (MISCELLANEOUS) ×2 IMPLANT
SPONGE LAP 4X18 RFD (DISPOSABLE) IMPLANT
SPONGE SURGIFOAM ABS GEL SZ50 (HEMOSTASIS) ×2 IMPLANT
STAPLER SKIN PROX WIDE 3.9 (STAPLE) IMPLANT
STRIP CLOSURE SKIN 1/2X4 (GAUZE/BANDAGES/DRESSINGS) IMPLANT
SUT SILK 0 TIES 10X30 (SUTURE) IMPLANT
SUT VIC AB 0 CT1 18XCR BRD8 (SUTURE) ×1 IMPLANT
SUT VIC AB 0 CT1 8-18 (SUTURE) ×2
SUT VIC AB 2-0 CP2 18 (SUTURE) ×2 IMPLANT
SUT VIC AB 3-0 SH 8-18 (SUTURE) ×2 IMPLANT
SUT VIC AB 4-0 RB1 18 (SUTURE) ×4 IMPLANT
SYR CONTROL 10ML LL (SYRINGE) ×2 IMPLANT
TOWEL GREEN STERILE (TOWEL DISPOSABLE) ×2 IMPLANT
TOWEL GREEN STERILE FF (TOWEL DISPOSABLE) ×2 IMPLANT
WATER STERILE IRR 1000ML POUR (IV SOLUTION) ×2 IMPLANT

## 2019-04-10 NOTE — Op Note (Signed)
Date of surgery: 04/10/2019 Preoperative diagnosis: Nonfunctioning spinal cord stimulator Postoperative diagnosis: Same Procedure: Removal of spinal cord stimulator generator and paddle electrodes Surgeon: Kristeen Miss Anesthesia: General endotracheal Indications: [name] is a 79 year old individual who for the last 4-1/2 years has had a spinal cord stimulator in place.  She initially felt some good relief followed by decline in function despite multiple trials of resetting the patient's programming she ultimately stopped using it about a year ago and she desires to have the device removed.  Procedure: The patient was brought to the operating room supine on a stretcher.  After the smooth induction of general endotracheal anesthesia she was turned prone.  The back was prepped with alcohol DuraPrep and draped in a sterile fashion and 2 previously made incisions for insertion of the spinal cord stimulator root were reopened.  In the thoracic spine dissection was carried down through the subcutaneous tissues until the electrode wires could be identified.  Then the electrode wires were traced down to the laminectomy site in the mid thoracic spine.  These were carefully dissected free and a small laminotomy needed to be increased in size in order to free the electrodes to the base of the paddle itself.  Once this was freed some dissection along the paddle base allowed it to be freed and ultimately the paddle electrode could be removed.  The second incision was dissected down through the subcutaneous tissues to identify the spot spinal cord stimulator generator.  This was gradually uncovered and the electrode wires there were identified.  By dissecting around the generator ultimately we were able to deliver the generator through the incision and remove the wires at their attachment point to the generator with the available screwdriver.  Then the electrodes were fed back through to the thoracic incision to  remove the entirety of the electrodes.  The incisions were irrigated and hemostasis was noted then the fascia was closed with 2-0 Vicryl and the subcutaneous tissues were closed with 3-0 and 4-0 Vicryl in interrupted fashion for the final layer being subcuticular closure Dermabond was placed on the skin and blood loss was nil.

## 2019-04-10 NOTE — Transfer of Care (Signed)
Immediate Anesthesia Transfer of Care Note  Patient: [name]  Procedure(s) Performed: Spinal cord stimulator removal, thoracic paddle and generator (N/A )  Patient Location: PACU  Anesthesia Type:General  Level of Consciousness: drowsy  Airway & Oxygen Therapy: Patient Spontanous Breathing and Patient connected to nasal cannula oxygen  Post-op Assessment: Report given to RN, Post -op Vital signs reviewed and stable and Patient moving all extremities  Post vital signs: Reviewed and stable  Last Vitals:  Vitals Value Taken Time  BP 141/73 04/10/19 0953  Temp 36.6 C 04/10/19 0953  Pulse 61 04/10/19 0959  Resp 17 04/10/19 0959  SpO2 94 % 04/10/19 0959  Vitals shown include unvalidated device data.  Last Pain:  Vitals:   04/10/19 0953  TempSrc:   PainSc: (P) 0-No pain      Patients Stated Pain Goal: 3 ([id number])  Complications: No apparent anesthesia complications

## 2019-04-10 NOTE — H&P (Signed)
[name] is an 79 y.o. female.   Chief Complaint: Nonfunctioning spinal cord stimulator HPI: [name] is a 79 year old individual who had a spinal cord stimulator placed about 5 years ago.  She initially got good relief from its usage but over the past year and a half it is become increasingly dysfunctional and has not provided her any alleviation of pain such that she has not used it for the past number of months various programs have been tried and the patient wishes to have the device removed now that it is causing some discomfort where the battery pack is.  She also complains of some mid thoracic pain believes that it may be related to the electrodes.  After discussion with her and also myelogram that demonstrates that the paddle electrodes are not in any dangerous place and should not be causing any problems for her she desires to have the device removed.  Past Medical History:  Diagnosis Date  . Addison's disease ([place])   . Anxiety   . Arthritis    "everywhere"  . Atrial fibrillation (H[facility])    Eliquis  . B12 deficiency   . Back pain   . CHF (congestive heart failure) ([place])   . Chronic bronchitis ([place])    "although I didn't get it this year" (07/23/2017)  . Colostomy in place [facility])   . 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  last knee surgery dr. Elmyra Ricks gave large dose of hydrocortisal and avoided the hypotensive side effectas of addison's disease.  . Coronary artery disease   . Depression   . GERD (gastroesophageal reflux disease)   . Hyperlipemia   . Hypertension   . Hypothyroidism   . Lumbar radiculopathy 04/27/2014  . Migraine    "in my 20's" (07/23/2017)  . Osteoarthritis   . Palpitations   . Recurrent upper respiratory infection (URI)   . Sleep apnea    uses a cpap   . SOB (shortness of breath)   . UTI (lower urinary tract infection) 07/09/2011  . Vitamin D deficiency     Past Surgical History:  Procedure  Laterality Date  . ACHILLES TENDON SURGERY Left 07/16/2014   bone spur removed  . ANTERIOR LUMBAR FUSION  1980   L4-5  . BACK SURGERY    . CARDIAC CATHETERIZATION  2013  . CARDIAC CATHETERIZATION N/A 09/02/2014   Procedure: Left Heart Cath and Coronary Angiography;  Surgeon: Wellington Hampshire, MD;  Location: [place] [facility];  Service: Cardiovascular;  Laterality: N/A;  . CARDIOVERSION N/A 10/22/2015   Procedure: CARDIOVERSION;  Surgeon: Sanda Klein, MD;  Location: [place] ENDOSCOPY;  Service: Cardiovascular;  Laterality: N/A;  . CARDIOVERSION N/A 06/07/2017   Procedure: CARDIOVERSION;  Surgeon: Larey Dresser, MD;  Location: [facility] ENDOSCOPY;  Service: Cardiovascular;  Laterality: N/A;  . CARDIOVERSION N/A 07/05/2017   Procedure: CARDIOVERSION;  Surgeon: Pixie Casino, MD;  Location: [facility] ENDOSCOPY;  Service: Cardiovascular;  Laterality: N/A;  . CARDIOVERSION N/A 09/30/2018   Procedure: CARDIOVERSION;  Surgeon: Buford Dresser, MD;  Location: [facility] ENDOSCOPY;  Service: Cardiovascular;  Laterality: N/A;  . CARDIOVERSION N/A 11/26/2018   Procedure: CARDIOVERSION;  Surgeon: Sanda Klein, MD;  Location: [facility] ENDOSCOPY;  Service: Cardiovascular;  Laterality: N/A;  . CARPAL TUNNEL RELEASE Right   . CATARACT EXTRACTION, BILATERAL Bilateral   . COLON RESECTION N/A 03/20/2018   Procedure: LAPAROSCOPIC SIGMOIDECTOMY WITH COLOSTOMY;  Surgeon: Ileana Roup, MD;  Location: [facility] ORS;  Service: General;  Laterality: N/A;  . COLON SURGERY     laparascopic vs. open sigmoidectomy with cystoscopy  Dr. Dema Severin and Dr. Gloriann Loan 03-20-18  . COLONOSCOPY WITH PROPOFOL N/A 01/17/2018   Procedure: COLONOSCOPY WITH PROPOFOL;  Surgeon: Milus Banister, MD;  Location: [facility] ENDOSCOPY;  Service: Endoscopy;  Laterality: N/A;  . CORONARY ARTERY BYPASS GRAFT  06/26/2011   Procedure: CORONARY ARTERY BYPASS GRAFTING (CABG);  Surgeon: Melrose Nakayama, MD;  Location: [place];  Service: Open Heart Surgery;  Laterality: N/A;  times  using Greater Saphenous Vein Graft harvested endoscopically from right leg  . CYSTOSCOPY W/ URETERAL STENT PLACEMENT Bilateral 03/20/2018   Procedure: CYSTOSCOPY WITH BILATERAL RETROGRADE AND BILATERAL URETERAL CATHETER PLACEMENT;  Surgeon: Lucas Mallow, MD;  Location: [facility] ORS;  Service: Urology;  Laterality: Bilateral;  . DIAGNOSTIC LAPAROSCOPY    . DILATION AND CURETTAGE OF UTERUS    . JOINT REPLACEMENT    . KNEE ARTHROSCOPY Bilateral   . LAPAROSCOPIC CHOLECYSTECTOMY    . POLYPECTOMY  01/17/2018   Procedure: POLYPECTOMY;  Surgeon: Milus Banister, MD;  Location: [facility];  Service: Endoscopy;;  . REVISION TOTAL KNEE ARTHROPLASTY Right   . SPINAL CORD STIMULATOR IMPLANT  12/10/15  . TAKE DOWN OF INTESTINAL FISTULA N/A 03/20/2018   Procedure: LAPAROSCOPIC TAKE DOWN OF COLOVESICAL FISTULA;  Surgeon: Ileana Roup, MD;  Location: [facility] OR[facility];  Service: General;  Laterality: N/A;  . TONSILLECTOMY    . TOTAL KNEE ARTHROPLASTY Bilateral     Family History  Problem Relation Age of Onset  . Heart attack Father   . Heart disease Father   . Hypertension Mother   . Stroke Mother   . Hypertension Maternal Grandfather   . Colon cancer Neg Hx    Social History:  reports that she is a non-smoker but has been exposed to tobacco smoke. She has been exposed to 0.00 packs per day for the past 0.00 years. She has never used smokeless tobacco. She reports current alcohol use. She reports that she does not use drugs.  Allergies:  Allergies  Allergen Reactions  . Oxycodone Other (See Comments)    Hallucinations   . Tramadol Other (See Comments)    Hallucinations  . Sulfa Antibiotics Itching    Vaginal itching  . Sulfacetamide Sodium Itching    Vaginal itching    Medications Prior to Admission  Medication Sig Dispense Refill  . acetaminophen (TYLENOL) 500 MG tablet Take 500 mg by mouth every 6 (six) hours as needed for mild pain or headache.     [name] amiodarone (PACERONE) 200 MG tablet  Take 1 tablet (200 mg total) by mouth daily. 90 tablet 3  . aspirin EC 81 MG tablet Take 81 mg by mouth every Monday, Wednesday, and Friday. In the morning.    . clonazePAM (KLONOPIN) 0.5 MG tablet Take 0.25-0.5 mg by mouth 2 (two) times daily as needed for anxiety.    . Cyanocobalamin 1000 MCG/ML KIT Inject 1,000 mcg into the muscle every 30 (thirty) days. Take one (1) injection as directed every month.    . diltiazem (CARDIZEM CD) 180 MG 24 hr capsule Take 1 capsule (180 mg total) by mouth daily. 90 capsule 3  . ELIQUIS 5 MG TABS tablet TAKE ONE TABLET TWICE DAILY (Patient taking differently: Take 5 mg by mouth 2 (two) times daily. ) 60 tablet 5  . Evolocumab 140 MG/ML SOAJ Inject 140 mg into the skin every 14 (fourteen) days. (Patient taking differently: Inject 140 mg into  the skin every 14 (fourteen) days. Repatha 1st and 15th) 2 pen 11  . FLUoxetine (PROZAC) 10 MG tablet Take 10 mg by mouth daily.     . hydrocortisone (CORTEF) 20 MG tablet Take 20 mg by mouth 2 (two) times daily.    [name] levothyroxine (SYNTHROID, LEVOTHROID) 88 MCG tablet Take 1 tablet (88 mcg total) by mouth daily before breakfast. 30 tablet 0  . magnesium oxide (MAG-OX) 400 MG tablet Take 1 tablet (400 mg total) by mouth daily. (Patient taking differently: Take 240 mg by mouth daily. ) 90 tablet 1  . omeprazole (PRILOSEC) 20 MG capsule Take 20 mg by mouth daily.     . potassium chloride (K-DUR) 10 MEQ tablet Take 2 tablets (20 mEq total) by mouth 2 (two) times daily. (Patient taking differently: Take 20 mEq by mouth 3 (three) times daily. ) 360 tablet 3  . rOPINIRole (REQUIP) 0.25 MG tablet Take 0.5 mg by mouth at bedtime.    . valsartan (DIOVAN) 80 MG tablet TAKE 1/2 TABLET EVERY MORNING AND TAKE ADDITIONAL ONE-HALF TABLET IN THE AFTERNOON FOR BLOOD PRESSURE > 140/80 (Patient taking differently: Take 80 mg by mouth at bedtime. ) 90 tablet 3  . Vitamin D, Ergocalciferol, (DRISDOL) 50000 UNITS CAPS Take 50,000 Units by mouth 2  (two) times a week. Takes on Tuesdays and Fridays    . furosemide (LASIX) 40 MG tablet Take 1 tablet (40 mg total) by mouth 2 (two) times daily. (Patient not taking: Reported on 04/02/2019) 180 tablet 3  . NITROSTAT 0.4 MG SL tablet ONE TABLET UNDER TONGUE AS NEEDED FOR CHEST PAIN AS DIRECTED (Patient taking differently: Place 0.4 mg under the tongue every 5 (five) minutes as needed for chest pain. ) 25 tablet 6    Results for orders placed or performed during the hospital encounter of 04/10/19 (from the past 48 hour(s))  Protime-INR     Status: None   Collection Time: 04/10/19  6:30 AM  Result Value Ref Range   Prothrombin Time 12.9 11.4 - 15.2 seconds   INR 1.0 0.8 - 1.2    Comment: (NOTE) INR goal varies based on device and disease states. Performed at [facility], [place] [address] [place], [place] [address]    No results found.  Review of Systems  Constitutional: Negative.   HENT: Negative.   Eyes: Negative.   Respiratory: Negative.   Cardiovascular: Negative.   Gastrointestinal: Negative.   Endocrine: Negative.   Genitourinary: Negative.   Musculoskeletal: Positive for back pain.  Allergic/Immunologic: Negative.   Neurological: Positive for weakness.  Hematological: Negative.   Psychiatric/Behavioral: Negative.     Blood pressure (!) 143/66, pulse 66, temperature 98.7 F (37.1 C), temperature source Oral, resp. rate 18, height 5' 4.5" (1.638 m), weight 93 kg, SpO2 99 %. Physical Exam  Constitutional: She is oriented to person, place, and time. She appears well-developed and well-nourished.  Obese  HENT:  Head: Normocephalic and atraumatic.  Eyes: Pupils are equal, round, and reactive to light. Conjunctivae and EOM are normal.  Cardiovascular: Normal rate and regular rhythm.  Respiratory: Effort normal and breath sounds normal.  GI: Soft. Bowel sounds are normal.  Musculoskeletal:     Cervical back: Normal range of motion and neck supple.     Comments:  Well-healed midline thoracic incision in addition to left flank incision work generators placed.  No abnormal masses associated with these 2 incisions.  Straight leg raising is positive at 30 degrees in either lower extremity  Patrick's maneuver is negative bilaterally motor strength appears intact in the major muscle groups including the iliopsoas quadriceps tibialis anterior and gastrocs  Neurological: She is alert and oriented to person, place, and time.  Absent deep tendon reflexes in the patella and the Achilles both.  Upper extremity reflexes are trace in the biceps and absent in the triceps  Skin: Skin is warm and dry.  Psychiatric: She has a normal mood and affect. Her behavior is normal. Judgment and thought content normal.     Assessment/Plan Nonfunctioning spinal cord stimulator.  Plan: Removal of paddle electrodes and spinal cord stimulator.  Earleen Newport, MD 04/10/2019, 7:57 AM

## 2019-04-10 NOTE — Anesthesia Procedure Notes (Signed)
Procedure Name: Intubation Date/Time: 04/10/2019 8:22 AM Performed by: Zoye Chandra T, CRNA Pre-anesthesia Checklist: Patient identified, Emergency Drugs available, Suction available and Patient being monitored Patient Re-evaluated:Patient Re-evaluated prior to induction Oxygen Delivery Method: Circle system utilized Preoxygenation: Pre-oxygenation with 100% oxygen Induction Type: IV induction Ventilation: Mask ventilation without difficulty Laryngoscope Size: Miller and 2 Grade View: Grade II Tube type: Oral Tube size: 7.5 mm Number of attempts: 1 Airway Equipment and Method: Stylet and Oral airway Placement Confirmation: ETT inserted through vocal cords under direct vision,  positive ETCO2 and breath sounds checked- equal and bilateral Secured at: 22 cm Tube secured with: Tape Dental Injury: Teeth and Oropharynx as per pre-operative assessment

## 2019-04-10 NOTE — Anesthesia Preprocedure Evaluation (Signed)
Anesthesia Evaluation  Patient identified by MRN, date of birth, ID band Patient awake    Reviewed: Allergy & Precautions, H&P , NPO status , Patient's Chart, lab work & pertinent test results  Airway Mallampati: II   Neck ROM: full    Dental   Pulmonary sleep apnea ,    breath sounds clear to auscultation       Cardiovascular hypertension, + CAD, + CABG, +CHF and + DOE  + dysrhythmias Atrial Fibrillation  Rhythm:regular Rate:Normal     Neuro/Psych  Headaches, PSYCHIATRIC DISORDERS Anxiety Depression  Neuromuscular disease    GI/Hepatic GERD  ,  Endo/Other  Hypothyroidism   Renal/GU      Musculoskeletal  (+) Arthritis ,   Abdominal   Peds  Hematology   Anesthesia Other Findings   Reproductive/Obstetrics                             Anesthesia Physical Anesthesia Plan  ASA: III  Anesthesia Plan: General   Post-op Pain Management:    Induction: Intravenous  PONV Risk Score and Plan: 3 and Ondansetron, Dexamethasone and Treatment may vary due to age or medical condition  Airway Management Planned: Oral ETT  Additional Equipment:   Intra-op Plan:   Post-operative Plan: Extubation in OR  Informed Consent: I have reviewed the patients History and Physical, chart, labs and discussed the procedure including the risks, benefits and alternatives for the proposed anesthesia with the patient or authorized representative who has indicated his/her understanding and acceptance.       Plan Discussed with: CRNA, Anesthesiologist and Surgeon  Anesthesia Plan Comments:         Anesthesia Quick Evaluation

## 2019-04-11 ENCOUNTER — Encounter: Payer: Self-pay | Admitting: *Deleted

## 2019-04-11 MED ORDER — POTASSIUM CHLORIDE ER 10 MEQ PO TBCR: 40.0000 meq | EXTENDED_RELEASE_TABLET | Freq: Two times a day (BID) | ORAL | 7 refills | Status: AC

## 2019-04-11 NOTE — Telephone Encounter (Signed)
Call placed to Pt.  Advised per Dr. Jarold Motto should continue lasix 40 mg bid.    Should increase  Potassium to 40 meq BID  Updated potassium prescription and sent in to pt pharmacy  Repeat bmp in one week-scheduled for 1/14.  Pt indicates understanding and thanked nurse for call.

## 2019-04-14 NOTE — Anesthesia Postprocedure Evaluation (Signed)
Anesthesia Post Note  Patient: [name]  Procedure(s) Performed: Spinal cord stimulator removal, thoracic paddle and generator (N/A )     Patient location during evaluation: PACU Anesthesia Type: General Level of consciousness: awake and alert Pain management: pain level controlled Vital Signs Assessment: post-procedure vital signs reviewed and stable Respiratory status: spontaneous breathing, nonlabored ventilation, respiratory function stable and patient connected to nasal cannula oxygen Cardiovascular status: blood pressure returned to baseline and stable Postop Assessment: no apparent nausea or vomiting Anesthetic complications: no    Last Vitals:  Vitals:   04/10/19 1038 04/10/19 1053  BP: 121/64 111/86  Pulse: (!) 58 (!) 58  Resp: (!) 22 16  Temp:  36.6 C  SpO2: 94% 97%    Last Pain:  Vitals:   04/10/19 1053  TempSrc:   PainSc: 0-No pain                 Latronda Spink S

## 2019-04-17 ENCOUNTER — Other Ambulatory Visit: Payer: Medicare PPO | Admitting: *Deleted

## 2019-04-17 ENCOUNTER — Other Ambulatory Visit: Payer: Self-pay

## 2019-04-17 DIAGNOSIS — E876 Hypokalemia: Secondary | ICD-10-CM

## 2019-04-17 LAB — BASIC METABOLIC PANEL
BUN/Creatinine Ratio: 25 (ref 12–28)
BUN: 27 mg/dL (ref 8–27)
CO2: 24 mmol/L (ref 20–29)
Calcium: 9.3 mg/dL (ref 8.7–10.3)
Chloride: 97 mmol/L (ref 96–106)
Creatinine, Ser: 1.07 mg/dL — ABNORMAL HIGH (ref 0.57–1.00)
GFR calc Af Amer: 57 mL/min/{1.73_m2} — ABNORMAL LOW (ref >59)
GFR calc non Af Amer: 49 mL/min/{1.73_m2} — ABNORMAL LOW (ref >59)
Glucose: 197 mg/dL — ABNORMAL HIGH (ref 65–99)
Potassium: 4.1 mmol/L (ref 3.5–5.2)
Sodium: 139 mmol/L (ref 134–144)

## 2019-05-04 ENCOUNTER — Ambulatory Visit: Payer: Medicare PPO

## 2019-05-05 ENCOUNTER — Ambulatory Visit: Payer: Medicare Other | Admitting: Internal Medicine

## 2019-05-09 ENCOUNTER — Ambulatory Visit: Payer: Medicare PPO

## 2019-05-10 ENCOUNTER — Other Ambulatory Visit (HOSPITAL_COMMUNITY)
Admission: RE | Admit: 2019-05-10 | Discharge: 2019-05-10 | Disposition: A | Discharge status: Home / Self Care | Payer: Medicare PPO | Source: Ambulatory Visit | Attending: Internal Medicine | Admitting: Internal Medicine

## 2019-05-10 DIAGNOSIS — Z01812 Encounter for preprocedural laboratory examination: Secondary | ICD-10-CM | POA: Insufficient documentation

## 2019-05-10 DIAGNOSIS — Z20822 Contact with and (suspected) exposure to covid-19: Secondary | ICD-10-CM | POA: Diagnosis not present

## 2019-05-10 LAB — SARS CORONAVIRUS 2 (TAT 6-24 HRS): SARS Coronavirus 2: NEGATIVE

## 2019-05-13 ENCOUNTER — Other Ambulatory Visit: Payer: Self-pay

## 2019-05-13 ENCOUNTER — Ambulatory Visit (INDEPENDENT_AMBULATORY_CARE_PROVIDER_SITE_OTHER): Payer: Medicare PPO | Admitting: Internal Medicine

## 2019-05-13 DIAGNOSIS — R0600 Dyspnea, unspecified: Secondary | ICD-10-CM

## 2019-05-13 LAB — PULMONARY FUNCTION TEST
DL/VA % pred: 107 %
DL/VA: 4.39 ml/min/mmHg/L
DLCO unc % pred: 76 %
DLCO unc: 14.7 ml/min/mmHg
FEF 25-75 Post: 1.65 L/sec
FEF 25-75 Pre: 1.66 L/sec
FEF2575-%Change-Post: 0 %
FEF2575-%Pred-Post: 110 %
FEF2575-%Pred-Pre: 111 %
FEV1-%Change-Post: 1 %
FEV1-%Pred-Post: 72 %
FEV1-%Pred-Pre: 71 %
FEV1-Post: 1.46 L
FEV1-Pre: 1.44 L
FEV1FVC-%Change-Post: 2 %
FEV1FVC-%Pred-Pre: 113 %
FEV6-%Change-Post: 0 %
FEV6-%Pred-Post: 66 %
FEV6-%Pred-Pre: 66 %
FEV6-Post: 1.7 L
FEV6-Pre: 1.71 L
FEV6FVC-%Change-Post: 0 %
FEV6FVC-%Pred-Post: 105 %
FEV6FVC-%Pred-Pre: 105 %
FVC-%Change-Post: 0 %
FVC-%Pred-Post: 62 %
FVC-%Pred-Pre: 63 %
FVC-Post: 1.7 L
FVC-Pre: 1.71 L
Post FEV1/FVC ratio: 86 %
Post FEV6/FVC ratio: 100 %
Pre FEV1/FVC ratio: 84 %
Pre FEV6/FVC Ratio: 100 %
RV % pred: 63 %
RV: 1.53 L
TLC % pred: 72 %
TLC: 3.73 L

## 2019-05-13 NOTE — Progress Notes (Signed)
PFT done today. 

## 2019-05-15 ENCOUNTER — Ambulatory Visit: Payer: Medicare Other | Admitting: Internal Medicine

## 2019-05-20 ENCOUNTER — Other Ambulatory Visit: Payer: Self-pay | Admitting: Interventional Cardiology

## 2019-05-20 NOTE — Telephone Encounter (Signed)
Prescription refill request for Eliquis received.  Last office visit: 12/16/2020Lovena Le Scr: 1.07, 04/17/2019 Age: 79 y.o. Weight: 95.3 kg   Prescription refill sent.

## 2019-05-23 ENCOUNTER — Ambulatory Visit: Payer: Medicare PPO | Admitting: Student

## 2019-05-30 ENCOUNTER — Other Ambulatory Visit: Payer: Self-pay

## 2019-05-30 ENCOUNTER — Encounter: Payer: Self-pay | Admitting: Internal Medicine

## 2019-05-30 ENCOUNTER — Ambulatory Visit: Payer: Medicare PPO | Admitting: Internal Medicine

## 2019-05-30 VITALS — BP 112/56 | HR 88 | Ht 64.5 in | Wt 209.0 lb

## 2019-05-30 DIAGNOSIS — R0600 Dyspnea, unspecified: Secondary | ICD-10-CM | POA: Diagnosis not present

## 2019-05-30 DIAGNOSIS — R0609 Other forms of dyspnea: Secondary | ICD-10-CM

## 2019-05-30 DIAGNOSIS — I10 Essential (primary) hypertension: Secondary | ICD-10-CM

## 2019-05-30 DIAGNOSIS — I480 Paroxysmal atrial fibrillation: Secondary | ICD-10-CM

## 2019-05-30 DIAGNOSIS — I25810 Atherosclerosis of coronary artery bypass graft(s) without angina pectoris: Secondary | ICD-10-CM

## 2019-05-30 MED ORDER — FUROSEMIDE 40 MG PO TABS: ORAL_TABLET | ORAL | 3 refills | Status: AC

## 2019-05-30 NOTE — Patient Instructions (Addendum)
Medication Instructions:  Your physician has recommended you make the following change in your medication:   1.  Increase your lasix 40 mg --Take 2 tablets by mouth in the morning.  Then take 1 tablet after lunch.   Labwork: You will get lab work in 2 weeks:  BMP and sed rate  June 12, 2019 at the [facility] office any time between 8:00 am and 4:30 pm  Testing/Procedures: None ordered.  Follow-Up: Your physician wants you to follow-up in: based on results of your lab work.  Any Other Special Instructions Will Be Listed Below (If Applicable).  If you need a refill on your cardiac medications before your next appointment, please call your pharmacy.

## 2019-05-30 NOTE — Progress Notes (Signed)
HPI [name] returns today for followup. She has a h/o persistent atrial fib, who failed dofetilide and has been on amiodarone. She has a long h/o chronic dyspnea which she thinks has improved. She has diastolic heart failure. She thinks that if anything her dyspnea is improved since her prior visit. 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  Allergies  Allergen Reactions  . Oxycodone Other (See Comments)    Hallucinations   . Tramadol Other (See Comments)    Hallucinations  . Sulfa Antibiotics Itching    Vaginal itching  . Sulfacetamide Sodium Itching    Vaginal itching     Current Outpatient Medications  Medication Sig Dispense Refill  . acetaminophen (TYLENOL) 500 MG tablet Take 500 mg by mouth every 6 (six) hours as needed for mild pain or headache.     [name] amiodarone (PACERONE) 200 MG tablet Take 1 tablet (200 mg total) by mouth daily. 90 tablet 3  . aspirin EC 81 MG tablet Take 81 mg by mouth every Monday, Wednesday, and Friday. In the morning.    . clonazePAM (KLONOPIN) 0.5 MG tablet Take 0.25-0.5 mg by mouth 2 (two) times daily as needed for anxiety.    . Cyanocobalamin 1000 MCG/ML KIT Inject 1,000 mcg into the muscle every 30 (thirty) days. Take one (1) injection as directed every month.    . diltiazem (CARDIZEM CD) 180 MG 24 hr capsule Take 1 capsule (180 mg total) by mouth daily. 90 capsule 3  . ELIQUIS 5 MG TABS tablet TAKE ONE TABLET TWICE DAILY 60 tablet 5  . Evolocumab 140 MG/ML SOAJ Inject 140 mg into the skin every 14 (fourteen) days. (Patient taking differently: Inject 140 mg into the skin every 14 (fourteen) days. Repatha 1st and 15th) 2 pen 11  . FLUoxetine (PROZAC) 10 MG tablet Take 10 mg by mouth daily.     . furosemide (LASIX) 40 MG tablet Take 1 tablet (40 mg total) by mouth 2 (two) times daily. 180 tablet 3  . HYDROcodone-acetaminophen (NORCO) 5-325 MG tablet Take 1-2 tablets by mouth every 6 (six)  hours as needed for moderate pain. 30 tablet 0  . hydrocortisone (CORTEF) 20 MG tablet Take 20 mg by mouth 2 (two) times daily.    [name] levothyroxine (SYNTHROID, LEVOTHROID) 88 MCG tablet Take 1 tablet (88 mcg total) by mouth daily before breakfast. 30 tablet 0  . magnesium oxide (MAG-OX) 400 MG tablet Take 1 tablet (400 mg total) by mouth daily. (Patient taking differently: Take 240 mg by mouth daily. ) 90 tablet 1  . NITROSTAT 0.4 MG SL tablet ONE TABLET UNDER TONGUE AS NEEDED FOR CHEST PAIN AS DIRECTED (Patient taking differently: Place 0.4 mg under the tongue every 5 (five) minutes as needed for chest pain. ) 25 tablet 6  . omeprazole (PRILOSEC) 20 MG capsule Take 20 mg by mouth daily.     . potassium chloride (KLOR-CON) 10 MEQ tablet Take 4 tablets (40 mEq total) by mouth 2 (two) times daily. 360 tablet 7  . rOPINIRole (REQUIP) 0.25 MG tablet Take 0.5 mg by mouth at bedtime.    . valsartan (DIOVAN) 80 MG tablet TAKE 1/2 TABLET EVERY MORNING AND TAKE ADDITIONAL ONE-HALF TABLET IN THE AFTERNOON FOR BLOOD PRESSURE > 140/80 (Patient taking differently: Take 80 mg by mouth at bedtime. ) 90 tablet 3  . Vitamin D, Ergocalciferol, (DRISDOL) 50000 UNITS CAPS Take 50,000 Units by mouth 2 (two) times a  week. Takes on Tuesdays and Fridays     No current facility-administered medications for this visit.     Past Medical History:  Diagnosis Date  . Addison's disease ([place])   . Anxiety   . Arthritis    "everywhere"  . Atrial fibrillation (H[facility])    Eliquis  . B12 deficiency   . Back pain   . CHF (congestive heart failure) ([place])   . Chronic bronchitis ([place])    "although I didn't get it this year" (07/23/2017)  . Colostomy in place [facility])   . 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  last knee surgery dr. Elmyra Ricks gave large dose of hydrocortisal and avoided the hypotensive side effectas of addison's disease.  . Coronary artery disease   . Depression   . GERD  (gastroesophageal reflux disease)   . Hyperlipemia   . Hypertension   . Hypothyroidism   . Lumbar radiculopathy 04/27/2014  . Migraine    "in my 20's" (07/23/2017)  . Osteoarthritis   . Palpitations   . Recurrent upper respiratory infection (URI)   . Sleep apnea    uses a cpap   . SOB (shortness of breath)   . UTI (lower urinary tract infection) 07/09/2011  . Vitamin D deficiency     ROS:   All systems reviewed and negative except as noted in the HPI.   Past Surgical History:  Procedure Laterality Date  . ACHILLES TENDON SURGERY Left 07/16/2014   bone spur removed  . ANTERIOR LUMBAR FUSION  1980   L4-5  . BACK SURGERY    . CARDIAC CATHETERIZATION  2013  . CARDIAC CATHETERIZATION N/A 09/02/2014   Procedure: Left Heart Cath and Coronary Angiography;  Surgeon: Wellington Hampshire, MD;  Location: [facility];  Service: Cardiovascular;  Laterality: N/A;  . CARDIOVERSION N/A 10/22/2015   Procedure: CARDIOVERSION;  Surgeon: Sanda Klein, MD;  Location: [facility]OSCOPY;  Service: Cardiovascular;  Laterality: N/A;  . CARDIOVERSION N/A 06/07/2017   Procedure: CARDIOVERSION;  Surgeon: Larey Dresser, MD;  Location: [facility] ENDOSCOPY;  Service: Cardiovascular;  Laterality: N/A;  . CARDIOVERSION N/A 07/05/2017   Procedure: CARDIOVERSION;  Surgeon: Pixie Casino, MD;  Location: [facility] ENDOSCOPY;  Service: Cardiovascular;  Laterality: N/A;  . CARDIOVERSION N/A 09/30/2018   Procedure: CARDIOVERSION;  Surgeon: Buford Dresser, MD;  Location: [facility] ENDOSCOPY;  Service: Cardiovascular;  Laterality: N/A;  . CARDIOVERSION N/A 11/26/2018   Procedure: CARDIOVERSION;  Surgeon: Sanda Klein, MD;  Location: [facility] ENDOSCOPY;  Service: Cardiovascular;  Laterality: N/A;  . CARPAL TUNNEL RELEASE Right   . CATARACT EXTRACTION, BILATERAL Bilateral   . COLON RESECTION N/A 03/20/2018   Procedure: LAPAROSCOPIC SIGMOIDECTOMY WITH COLOSTOMY;  Surgeon: Ileana Roup, MD;  Location: [facility] ORS;  Service: General;   Laterality: N/A;  . COLON SURGERY     laparascopic vs. open sigmoidectomy with cystoscopy  Dr. Dema Severin and Dr. Gloriann Loan 03-20-18  . COLONOSCOPY WITH PROPOFOL N/A 01/17/2018   Procedure: COLONOSCOPY WITH PROPOFOL;  Surgeon: Milus Banister, MD;  Location: [facility] ENDOSCOPY;  Service: Endoscopy;  Laterality: N/A;  . CORONARY ARTERY BYPASS GRAFT  06/26/2011   Procedure: CORONARY ARTERY BYPASS GRAFTING (CABG);  Surgeon: Melrose Nakayama, MD;  Location: [place];  Service: Open Heart Surgery;  Laterality: N/A;  times using Greater Saphenous Vein Graft harvested endoscopically from right leg  . CYSTOSCOPY W/ URETERAL STENT PLACEMENT Bilateral 03/20/2018   Procedure: CYSTOSCOPY WITH BILATERAL RETROGRADE AND BILATERAL URETERAL CATHETER PLACEMENT;  Surgeon: Lucas Mallow, MD;  Location: [facility] OR[facility];  Service: Urology;  Laterality: Bilateral;  . DIAGNOSTIC LAPAROSCOPY    . DILATION AND CURETTAGE OF UTERUS    . JOINT REPLACEMENT    . KNEE ARTHROSCOPY Bilateral   . LAPAROSCOPIC CHOLECYSTECTOMY    . POLYPECTOMY  01/17/2018   Procedure: POLYPECTOMY;  Surgeon: Milus Banister, MD;  Location: [facility];  Service: Endoscopy;;  . REVISION TOTAL KNEE ARTHROPLASTY Right   . SPINAL CORD STIMULATOR IMPLANT  12/10/15  . SPINAL CORD STIMULATOR REMOVAL N/A 04/10/2019   Procedure: Spinal cord stimulator removal, thoracic paddle and generator;  Surgeon: Kristeen Miss, MD;  Location: [place];  Service: Neurosurgery;  Laterality: N/A;  Spinal cord stimulator removal, thoracic paddle and generator  . TAKE DOWN OF INTESTINAL FISTULA N/A 03/20/2018   Procedure: LAPAROSCOPIC TAKE DOWN OF COLOVESICAL FISTULA;  Surgeon: Ileana Roup, MD;  Location: [facility];  Service: General;  Laterality: N/A;  . TONSILLECTOMY    . TOTAL KNEE ARTHROPLASTY Bilateral      Family History  Problem Relation Age of Onset  . Heart attack Father   . Heart disease Father   . Hypertension Mother   . Stroke Mother   . Hypertension Maternal  Grandfather   . Colon cancer Neg Hx      Social History   Socioeconomic History  . Marital status: Widowed    Spouse name: Not on file  . Number of children: Not on file  . Years of education: Not on file  . Highest education level: Not on file  Occupational History  . Occupation: Retired Pharmacist, hospital  Tobacco Use  . Smoking status: Passive Smoke Exposure - Never Smoker  . Smokeless tobacco: Never Used  . Tobacco comment: "husband passed in 2010; father passed 1978"  Substance and Sexual Activity  . Alcohol use: Yes    Alcohol/week: 0.0 standard drinks    Comment: glass of wine occasionally   . Drug use: No  . Sexual activity: Not Currently    Birth control/protection: Post-menopausal  Other Topics Concern  . Not on file  Social History Narrative  . Not on file   Social Determinants of Health   Financial Resource Strain:   . Difficulty of Paying Living Expenses: Not on file  Food Insecurity:   . Worried About Charity fundraiser in the Last Year: Not on file  . Ran Out of Food in the Last Year: Not on file  Transportation Needs:   . Lack of Transportation (Medical): Not on file  . Lack of Transportation (Non-Medical): Not on file  Physical Activity:   . Days of Exercise per Week: Not on file  . Minutes of Exercise per Session: Not on file  Stress:   . Feeling of Stress : Not on file  Social Connections:   . Frequency of Communication with Friends and Family: Not on file  . Frequency of Social Gatherings with Friends and Family: Not on file  . Attends Religious Services: Not on file  . Active Member of Clubs or Organizations: Not on file  . Attends Archivist Meetings: Not on file  . Marital Status: Not on file  Intimate Partner Violence:   . Fear of Current or Ex-Partner: Not on file  . Emotionally Abused: Not on file  . Physically Abused: Not on file  . Sexually Abused: Not on file     BP (!) 112/56   Pulse 88   Ht 5' 4.5" (1.638  m)   Wt 209 lb  (94.8 kg)   SpO2 97%   BMI 35.32 kg/m   Physical Exam:  Well appearing NAD HEENT: Unremarkable Neck:  No JVD, no thyromegally Lymphatics:  No adenopathy Back:  No CVA tenderness Lungs:  Clear with no wheezes; occaisional scattered rales HEART:  Regular rate rhythm, no murmurs, no rubs, no clicks Abd:  soft, positive bowel sounds, no organomegally, no rebound, no guarding Ext:  2 plus pulses, no edema, no cyanosis, no clubbing Skin:  No rashes no nodules Neuro:  CN II through XII intact, motor grossly intact  EKG - nsr  DEVICE  Normal device function.  See PaceArt for details.   Assess/Plan: 1. Persistent atrial fib - she is maintaining NSR on amiodarone. She will continue for now. 2. Dyspnea - she thinks that she is a little better. I discussed the results of her PFT's and we considered stopping the amiodarone. We will check an ESR. If it is elevated, we will stop the amio. In the interim, she will increase lasix to 80/40 a.m and p.m. 3. Systemic anti-coagulation - she is not bleeding. No suggestion that her dyspnea is due to chronic venous thromboembolic disease.  Mikle Bosworth.D.

## 2019-06-09 ENCOUNTER — Other Ambulatory Visit: Payer: Self-pay | Admitting: Student

## 2019-06-09 NOTE — Telephone Encounter (Signed)
Not a [facility] clinic  patient

## 2019-06-12 ENCOUNTER — Other Ambulatory Visit: Payer: Medicare PPO

## 2019-06-13 ENCOUNTER — Other Ambulatory Visit: Payer: Medicare PPO | Admitting: *Deleted

## 2019-06-13 ENCOUNTER — Other Ambulatory Visit: Payer: Self-pay

## 2019-06-13 DIAGNOSIS — R0600 Dyspnea, unspecified: Secondary | ICD-10-CM

## 2019-06-13 DIAGNOSIS — R0609 Other forms of dyspnea: Secondary | ICD-10-CM

## 2019-06-13 DIAGNOSIS — I10 Essential (primary) hypertension: Secondary | ICD-10-CM

## 2019-06-13 DIAGNOSIS — I25810 Atherosclerosis of coronary artery bypass graft(s) without angina pectoris: Secondary | ICD-10-CM

## 2019-06-13 DIAGNOSIS — I480 Paroxysmal atrial fibrillation: Secondary | ICD-10-CM

## 2019-06-13 LAB — BASIC METABOLIC PANEL
BUN/Creatinine Ratio: 15 (ref 12–28)
BUN: 16 mg/dL (ref 8–27)
CO2: 25 mmol/L (ref 20–29)
Calcium: 9.2 mg/dL (ref 8.7–10.3)
Chloride: 94 mmol/L — ABNORMAL LOW (ref 96–106)
Creatinine, Ser: 1.1 mg/dL — ABNORMAL HIGH (ref 0.57–1.00)
GFR calc Af Amer: 55 mL/min/{1.73_m2} — ABNORMAL LOW (ref >59)
GFR calc non Af Amer: 48 mL/min/{1.73_m2} — ABNORMAL LOW (ref >59)
Glucose: 204 mg/dL — ABNORMAL HIGH (ref 65–99)
Potassium: 3.5 mmol/L (ref 3.5–5.2)
Sodium: 138 mmol/L (ref 134–144)

## 2019-06-13 LAB — SEDIMENTATION RATE: Sed Rate: 9 mm/hr (ref 0–40)

## 2019-06-16 ENCOUNTER — Other Ambulatory Visit: Payer: Self-pay | Admitting: Internal Medicine

## 2019-06-16 MED ORDER — MAGNESIUM OXIDE 400 MG PO TABS: 400.0000 mg | ORAL_TABLET | Freq: Every day | ORAL | 2 refills | Status: AC

## 2019-06-16 NOTE — Telephone Encounter (Signed)
Pt's medication was sent to pt's pharmacy as requested. Confirmation received.  °

## 2019-06-17 ENCOUNTER — Ambulatory Visit (INDEPENDENT_AMBULATORY_CARE_PROVIDER_SITE_OTHER): Payer: Medicare Other | Admitting: Bariatrics

## 2019-06-18 ENCOUNTER — Telehealth: Payer: Self-pay

## 2019-06-18 NOTE — Telephone Encounter (Signed)
Left detailed message per DPR.  Advised sed rate was normal.  Advised other labs looked good.  Advised to continue on the increased dose of lasix and call office if she had any concerns.  Pt is due for follow up with [name] in 3 months-will send to scheduling to set up this appointment for continued follow up.

## 2019-06-19 ENCOUNTER — Ambulatory Visit: Payer: Medicare PPO

## 2019-07-01 ENCOUNTER — Ambulatory Visit: Payer: Medicare PPO | Attending: Internal Medicine | Admitting: Physical Therapy

## 2019-07-01 ENCOUNTER — Encounter: Payer: Self-pay | Admitting: Physical Therapy

## 2019-07-01 ENCOUNTER — Other Ambulatory Visit: Payer: Self-pay

## 2019-07-01 DIAGNOSIS — R262 Difficulty in walking, not elsewhere classified: Secondary | ICD-10-CM | POA: Diagnosis present

## 2019-07-01 DIAGNOSIS — G8929 Other chronic pain: Secondary | ICD-10-CM | POA: Insufficient documentation

## 2019-07-01 DIAGNOSIS — M545 Low back pain: Secondary | ICD-10-CM | POA: Diagnosis present

## 2019-07-01 DIAGNOSIS — M6281 Muscle weakness (generalized): Secondary | ICD-10-CM

## 2019-07-01 DIAGNOSIS — R2689 Other abnormalities of gait and mobility: Secondary | ICD-10-CM | POA: Insufficient documentation

## 2019-07-02 NOTE — Patient Instructions (Signed)
Access Code: [email or web address] URL: [email or web address] Date: 07/02/2019 Prepared by: Jari Favre  Exercises Hooklying Rib Cage Breathing - 1 x daily - 7 x weekly - 3 sets - 10 reps

## 2019-07-02 NOTE — Therapy (Signed)
[facility] [address] [name], [place], [address] Phone: [phone]   Fax:  [phone]  Physical Therapy Evaluation  Patient Details  Name: [name] MRN: [id number] Date of Birth: 1940/11/19 Referring Provider (PT): Crist Infante   Encounter Date: 07/01/2019  PT End of Session - 07/02/19 0950    Visit Number  1    Date for PT Re-Evaluation  09/23/19    PT Start Time  1400    PT Stop Time  1440    PT Time Calculation (min)  40 min    Activity Tolerance  Patient tolerated treatment well;Patient limited by pain    Behavior During Therapy  [facility] for tasks assessed/performed       Past Medical History:  Diagnosis Date  . Addison's disease ([place])   . Anxiety   . Arthritis    "everywhere"  . Atrial fibrillation (HCC)    Eliquis  . B12 deficiency   . Back pain   . CHF (congestive heart failure) ([place])   . Chronic bronchitis ([place])    "although I didn't get it this year" (07/23/2017)  . Colostomy in place [facility])   . 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  last knee surgery dr. Elmyra Ricks gave large dose of hydrocortisal and avoided the hypotensive side effectas of addison's disease.  . Coronary artery disease   . Depression   . GERD (gastroesophageal reflux disease)   . Hyperlipemia   . Hypertension   . Hypothyroidism   . Lumbar radiculopathy 04/27/2014  . Migraine    "in my 20's" (07/23/2017)  . Osteoarthritis   . Palpitations   . Recurrent upper respiratory infection (URI)   . Sleep apnea    uses a cpap   . SOB (shortness of breath)   . UTI (lower urinary tract infection) 07/09/2011  . Vitamin D deficiency     Past Surgical History:  Procedure Laterality Date  . ACHILLES TENDON SURGERY Left 07/16/2014   bone spur removed  . ANTERIOR LUMBAR FUSION  1980   L4-5  . BACK SURGERY    . CARDIAC CATHETERIZATION  2013  . CARDIAC CATHETERIZATION N/A 09/02/2014    Procedure: Left Heart Cath and Coronary Angiography;  Surgeon: Wellington Hampshire, MD;  Location: [place] CV LAB;  Service: Cardiovascular;  Laterality: N/A;  . CARDIOVERSION N/A 10/22/2015   Procedure: CARDIOVERSION;  Surgeon: Sanda Klein, MD;  Location: [place] ENDOSCOPY;  Service: Cardiovascular;  Laterality: N/A;  . CARDIOVERSION N/A 06/07/2017   Procedure: CARDIOVERSION;  Surgeon: Larey Dresser, MD;  Location: [facility] ENDOSCOPY;  Service: Cardiovascular;  Laterality: N/A;  . CARDIOVERSION N/A 07/05/2017   Procedure: CARDIOVERSION;  Surgeon: Pixie Casino, MD;  Location: [facility] ENDOSCOPY;  Service: Cardiovascular;  Laterality: N/A;  . CARDIOVERSION N/A 09/30/2018   Procedure: CARDIOVERSION;  Surgeon: Buford Dresser, MD;  Location: [facility] ENDOSCOPY;  Service: Cardiovascular;  Laterality: N/A;  . CARDIOVERSION N/A 11/26/2018   Procedure: CARDIOVERSION;  Surgeon: Sanda Klein, MD;  Location: [facility] ENDOSCOPY;  Service: Cardiovascular;  Laterality: N/A;  . CARPAL TUNNEL RELEASE Right   . CATARACT EXTRACTION, BILATERAL Bilateral   . COLON RESECTION N/A 03/20/2018   Procedure: LAPAROSCOPIC SIGMOIDECTOMY WITH COLOSTOMY;  Surgeon: Ileana Roup, MD;  Location: [facility] ORS;  Service: General;  Laterality: N/A;  . COLON SURGERY     laparascopic vs. open sigmoidectomy with cystoscopy  Dr. Dema Severin and Dr. Gloriann Loan 03-20-18  . COLONOSCOPY WITH PROPOFOL  N/A 01/17/2018   Procedure: COLONOSCOPY WITH PROPOFOL;  Surgeon: Milus Banister, MD;  Location: [facility] ENDOSCOPY;  Service: Endoscopy;  Laterality: N/A;  . CORONARY ARTERY BYPASS GRAFT  06/26/2011   Procedure: CORONARY ARTERY BYPASS GRAFTING (CABG);  Surgeon: Melrose Nakayama, MD;  Location: [place];  Service: Open Heart Surgery;  Laterality: N/A;  times using Greater Saphenous Vein Graft harvested endoscopically from right leg  . CYSTOSCOPY W/ URETERAL STENT PLACEMENT Bilateral 03/20/2018   Procedure: CYSTOSCOPY WITH BILATERAL RETROGRADE AND BILATERAL URETERAL  CATHETER PLACEMENT;  Surgeon: Lucas Mallow, MD;  Location: [facility] ORS;  Service: Urology;  Laterality: Bilateral;  . DIAGNOSTIC LAPAROSCOPY    . DILATION AND CURETTAGE OF UTERUS    . JOINT REPLACEMENT    . KNEE ARTHROSCOPY Bilateral   . LAPAROSCOPIC CHOLECYSTECTOMY    . POLYPECTOMY  01/17/2018   Procedure: POLYPECTOMY;  Surgeon: Milus Banister, MD;  Location: [facility];  Service: Endoscopy;;  . REVISION TOTAL KNEE ARTHROPLASTY Right   . SPINAL CORD STIMULATOR IMPLANT  12/10/15  . SPINAL CORD STIMULATOR REMOVAL N/A 04/10/2019   Procedure: Spinal cord stimulator removal, thoracic paddle and generator;  Surgeon: Kristeen Miss, MD;  Location: [place];  Service: Neurosurgery;  Laterality: N/A;  Spinal cord stimulator removal, thoracic paddle and generator  . TAKE DOWN OF INTESTINAL FISTULA N/A 03/20/2018   Procedure: LAPAROSCOPIC TAKE DOWN OF COLOVESICAL FISTULA;  Surgeon: Ileana Roup, MD;  Location: [facility];  Service: General;  Laterality: N/A;  . TONSILLECTOMY    . TOTAL KNEE ARTHROPLASTY Bilateral     There were no vitals filed for this visit.   Subjective Assessment - 07/01/19 1410    Subjective  Pt reports she has had excruciating pain since December 2020. Reports she has been having increasing pain since a year ago.  States she currently cannot walk without the walker because of pain and cannot cook, go grocery shopping.  Getting herself ready in the morning is too painful just the leaning forward.  Cannot walk or lean forward at all.  She states the pain is right in the center and does not radiate.    Patient Stated Goals  normal activities; get back down to 2-3/10 (was like that one year ago)    Currently in Pain?  Yes    Pain Score  8    gets up to 10+/10   Pain Location  Back    Pain Orientation  Medial    Pain Descriptors / Indicators  Sharp    Pain Type  Chronic pain    Pain Radiating Towards  radiates out into the hips bilat    Pain Onset  More than a month ago     Pain Frequency  Intermittent    Aggravating Factors   standing and walking; leaning forwards    Effect of Pain on Daily Activities  can't do anything    Multiple Pain Sites  No         [facility] PT Assessment - 07/02/19 0001      Assessment   Medical Diagnosis  MDM54.16 (ICD-10-CM) - Radiculopathy, lumbar region    Referring Provider (PT)  Perini, Mark    Onset Date/Surgical Date  --   about a year ago   Prior Therapy  No      Precautions   Precautions  None      Restrictions   Weight Bearing Restrictions  No      Balance Screen   Has the patient  fallen in the past 6 months  Yes    How many times?  1    Has the patient had a decrease in activity level because of a fear of falling?   No    Is the patient reluctant to leave their home because of a fear of falling?   No      Home Social worker  Private residence    Living Arrangements  Alone      Prior Function   Level of Independence  Independent with basic ADLs   has help with groceries and meal prep; cleaning; walk dog     Cognition   Overall Cognitive Status  Within Functional Limits for tasks assessed      Observation/Other Assessments   Focus on Therapeutic Outcomes (FOTO)   65% limited; goal 49%      Posture/Postural Control   Posture/Postural Control  Postural limitations    Postural Limitations  Rounded Shoulders;Flexed trunk      ROM / Strength   AROM / PROM / Strength  AROM;Strength      AROM   Overall AROM Comments  lumbar flexion 10%      Strength   Overall Strength Comments  Rt/Lt hip adduction, flexion, ext 4-/5; core is weak with difficulty activating without holding her breath      Flexibility   Soft Tissue Assessment /Muscle Length  yes    Hamstrings  60%      Palpation   Palpation comment  lumbar paraspinals tight and TTP      Ambulation/Gait   Assistive device  4-wheeled walker    Gait Pattern  Decreased stride length;Trunk flexed      Standardized Balance Assessment    Standardized Balance Assessment  Timed Up and Go Test      Timed Up and Go Test   Normal TUG (seconds)  25                Objective measurements completed on examination: See above findings.              PT Education - 07/02/19 0952    Education Details  Access Code: [email or web address]    Person(s) Educated  Patient    Methods  Explanation;Demonstration;Tactile cues;Verbal cues;Handout    Comprehension  Verbalized understanding;Returned demonstration       PT Short Term Goals - 07/02/19 1045      PT SHORT TERM GOAL #1   Title  pt will perform TUG in <19 sec for reduced risk of falls    Time  6    Period  Weeks    Status  New    Target Date  08/12/19      PT SHORT TERM GOAL #2   Baseline  Pt will be able to peform 6MWT with LRAD    Time  6    Period  Weeks    Status  New    Target Date  08/12/19      PT SHORT TERM GOAL #3   Title  Pt will report 25% less pain overall    Time  6    Period  Weeks    Status  New    Target Date  08/12/19        PT Long Term Goals - 07/02/19 1041      PT LONG TERM GOAL #1   Title  Pt will reduce TUG to <15 sec with LRAD for reduced risk of falls  Time  12    Period  Weeks    Status  New    Target Date  09/23/19      PT LONG TERM GOAL #2   Title  Pt will be able to ambulate with LRAD for at least 15 minutes in order to do grocery shopping    Time  12    Period  Weeks    Status  New    Target Date  09/23/19      PT LONG TERM GOAL #3   Title  Pt will report pain level up to 6/10 at the most during typical ADLs    Time  12    Period  Weeks    Status  New    Target Date  09/23/19      PT LONG TERM GOAL #4   Title  LE and core strength improved in order to perform self care in the morning such as cleaning and dressing without any assisstance    Time  12    Period  Weeks    Status  New    Target Date  09/23/19      PT LONG TERM GOAL #5   Title  Pt will be ind with advanced HEP in order to maintain strength  and manage low back pain after discharge from therapy    Time  12    Period  Weeks    Status  New    Target Date  09/23/19             Plan - 07/02/19 1010    Clinical Impression Statement  Pt presents to clinic due to ongoing back pain she has experience worsening over the last year.  Pt walks with flexed posture.  She has scoliosis.  Pt has decreased lumbar flexion and tension throughout lumbar paraspinals.  pt has LE weakness as mentioned above.  She has fallen a coulple of times recently and been more off balance.  TUG is 25 seconds with rollator.  She is unable to stand without use of UE.  Pt will benefit from skilled PT to address balance and gait deficits as well as strength and other impairments in order to return safely to maximum level of independence.    Personal Factors and Comorbidities  Time since onset of injury/illness/exacerbation;Comorbidity 1    Comorbidities  scoliosis, spondylosis    Examination-Activity Limitations  Dressing;Hygiene/Grooming;Stand;Locomotion Level    Examination-Participation Restrictions  Community Activity    Stability/Clinical Decision Making  Evolving/Moderate complexity    Clinical Decision Making  Low    Rehab Potential  Excellent    PT Frequency  2x / week    PT Duration  12 weeks    PT Treatment/Interventions  ADLs/Self Care Home Management;Aquatic Therapy;Biofeedback;Cryotherapy;Electrical Stimulation;Moist Heat;Traction;Neuromuscular re-education;Therapeutic exercise;Therapeutic activities;Stair training;Gait training;Patient/family education;Manual techniques;Dry needling;Passive range of motion;Taping    PT Next Visit Plan  lumbar STM and stretches, DN, h/s stretch, nustep for endurance and LE strength    PT Home Exercise Plan  Access Code: [email or web address]    Consulted and Agree with Plan of Care  Patient       Patient will benefit from skilled therapeutic intervention in order to improve the following deficits and impairments:  Abnormal  gait, Decreased range of motion, Difficulty walking, Increased muscle spasms, Pain, Decreased activity tolerance, Impaired flexibility, Decreased strength, Decreased balance, Postural dysfunction  Visit Diagnosis: Other abnormalities of gait and mobility - Plan: PT plan of care cert/re-cert  Chronic midline low back pain,  unspecified whether sciatica present - Plan: PT plan of care cert/re-cert  Muscle weakness (generalized)  Difficulty in walking, not elsewhere classified     Problem List Patient Active Problem List   Diagnosis Date Noted  . Class 2 drug-induced obesity with serious comorbidity and body mass index (BMI) of 36.0 to 36.9 in adult 02/19/2019  . S/P insertion of spinal cord stimulator 01/09/2019  . Spondylosis without myelopathy or radiculopathy, lumbar region 01/09/2019  . 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 Coronary artery disease 10/17/2018  . Mesenteric mass (lipoma) s/p excision 03/20/2018 03/25/2018  . Diverticulitis of sigmoid colon 03/25/2018  . S/P laparoscopic-assisted sigmoidectomy 03/20/2018  . Colonic mass 03/20/2018  . History of adenomatous polyp of colon   . Polyp of ascending colon   . 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 Orthostatic hypotension 09/24/2017  . Visit for monitoring Tikosyn therapy 07/23/2017  . Depression 06/15/2017  . Acute respiratory failure with hypoxia ([place])   . Hypothyroidism   . Bronchitis 03/22/2016  . Abnormal CT of the chest 03/16/2016  . Coronary artery disease involving coronary bypass graft of native heart with angina pectoris ([place]) 03/16/2016  . Chronic anticoagulation 10/06/2015  . On dofetilide therapy 10/06/2015  . Atrial fibrillation ([place]) 09/15/2015  . Snoring 02/17/2015  . Chronic low back pain 11/09/2014  . Disc degeneration, lumbar 11/09/2014  . Addison disease ([place]) 09/30/2014  . Chronic back pain 09/30/2014  . Hematochezia-(Hgb stable) 09/03/2014  . DOE  (dyspnea on exertion) 09/01/2014  . Lumbar radiculopathy 04/27/2014  . Osteoarthritis 11/18/2008  . Hyperlipidemia 11/17/2008  . Essential hypertension 11/17/2008    Jule Ser, PT 07/02/2019, 11:05 AM  [facility] [address] [place], [place], [address] Phone: [phone]   Fax:  [phone]  Name: [name] MRN: [id number] Date of Birth: 25-May-1940

## 2019-07-07 ENCOUNTER — Ambulatory Visit: Payer: Medicare PPO | Attending: Internal Medicine | Admitting: Physical Therapy

## 2019-07-07 ENCOUNTER — Encounter: Payer: Self-pay | Admitting: Physical Therapy

## 2019-07-07 ENCOUNTER — Other Ambulatory Visit: Payer: Self-pay

## 2019-07-07 DIAGNOSIS — R2689 Other abnormalities of gait and mobility: Secondary | ICD-10-CM | POA: Insufficient documentation

## 2019-07-07 DIAGNOSIS — G8929 Other chronic pain: Secondary | ICD-10-CM | POA: Insufficient documentation

## 2019-07-07 DIAGNOSIS — M545 Low back pain, unspecified: Secondary | ICD-10-CM

## 2019-07-07 DIAGNOSIS — M6281 Muscle weakness (generalized): Secondary | ICD-10-CM | POA: Insufficient documentation

## 2019-07-07 DIAGNOSIS — R262 Difficulty in walking, not elsewhere classified: Secondary | ICD-10-CM | POA: Insufficient documentation

## 2019-07-07 NOTE — Therapy (Signed)
[facility] [address] [place], [place], [address] Phone: [phone]   Fax:  [phone]  Physical Therapy Treatment  Patient Details  Name: [name] MRN: [id number] Date of Birth: 09/27/1940 Referring Provider (PT): Crist Infante   Encounter Date: 07/07/2019  PT End of Session - 07/07/19 1528    Visit Number  2    Date for PT Re-Evaluation  09/23/19    PT Start Time  1528    PT Stop Time  1612    PT Time Calculation (min)  44 min    Activity Tolerance  Patient tolerated treatment well;Patient limited by pain    Behavior During Therapy  [facility] for tasks assessed/performed       Past Medical History:  Diagnosis Date  . Addison's disease ([place])   . Anxiety   . Arthritis    "everywhere"  . Atrial fibrillation (HCC)    Eliquis  . B12 deficiency   . Back pain   . CHF (congestive heart failure) ([place])   . Chronic bronchitis ([place])    "although I didn't get it this year" (07/23/2017)  . Colostomy in place [facility])   . 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  last knee surgery dr. Elmyra Ricks gave large dose of hydrocortisal and avoided the hypotensive side effectas of addison's disease.  . Coronary artery disease   . Depression   . GERD (gastroesophageal reflux disease)   . Hyperlipemia   . Hypertension   . Hypothyroidism   . Lumbar radiculopathy 04/27/2014  . Migraine    "in my 20's" (07/23/2017)  . Osteoarthritis   . Palpitations   . Recurrent upper respiratory infection (URI)   . Sleep apnea    uses a cpap   . SOB (shortness of breath)   . UTI (lower urinary tract infection) 07/09/2011  . Vitamin D deficiency     Past Surgical History:  Procedure Laterality Date  . ACHILLES TENDON SURGERY Left 07/16/2014   bone spur removed  . ANTERIOR LUMBAR FUSION  1980   L4-5  . BACK SURGERY    . CARDIAC CATHETERIZATION  2013  . CARDIAC CATHETERIZATION N/A 09/02/2014   Procedure: Left Heart Cath and Coronary Angiography;  Surgeon: Wellington Hampshire, MD;  Location: [facility];  Service: Cardiovascular;  Laterality: N/A;  . CARDIOVERSION N/A 10/22/2015   Procedure: CARDIOVERSION;  Surgeon: Sanda Klein, MD;  Location: [place] ENDOSCOPY;  Service: Cardiovascular;  Laterality: N/A;  . CARDIOVERSION N/A 06/07/2017   Procedure: CARDIOVERSION;  Surgeon: Larey Dresser, MD;  Location: [facility] ENDOSCOPY;  Service: Cardiovascular;  Laterality: N/A;  . CARDIOVERSION N/A 07/05/2017   Procedure: CARDIOVERSION;  Surgeon: Pixie Casino, MD;  Location: [facility] ENDOSCOPY;  Service: Cardiovascular;  Laterality: N/A;  . CARDIOVERSION N/A 09/30/2018   Procedure: CARDIOVERSION;  Surgeon: Buford Dresser, MD;  Location: [facility] ENDOSCOPY;  Service: Cardiovascular;  Laterality: N/A;  . CARDIOVERSION N/A 11/26/2018   Procedure: CARDIOVERSION;  Surgeon: Sanda Klein, MD;  Location: [facility] ENDOSCOPY;  Service: Cardiovascular;  Laterality: N/A;  . CARPAL TUNNEL RELEASE Right   . CATARACT EXTRACTION, BILATERAL Bilateral   . COLON RESECTION N/A 03/20/2018   Procedure: LAPAROSCOPIC SIGMOIDECTOMY WITH COLOSTOMY;  Surgeon: Ileana Roup, MD;  Location: [facility] ORS;  Service: General;  Laterality: N/A;  . COLON SURGERY     laparascopic vs. open sigmoidectomy with cystoscopy  Dr. Dema Severin and Dr. Gloriann Loan 03-20-18  . COLONOSCOPY WITH PROPOFOL N/A  01/17/2018   Procedure: COLONOSCOPY WITH PROPOFOL;  Surgeon: Milus Banister, MD;  Location: [facility] ENDOSCOPY;  Service: Endoscopy;  Laterality: N/A;  . CORONARY ARTERY BYPASS GRAFT  06/26/2011   Procedure: CORONARY ARTERY BYPASS GRAFTING (CABG);  Surgeon: Melrose Nakayama, MD;  Location: [place];  Service: Open Heart Surgery;  Laterality: N/A;  times using Greater Saphenous Vein Graft harvested endoscopically from right leg  . CYSTOSCOPY W/ URETERAL STENT PLACEMENT Bilateral 03/20/2018   Procedure: CYSTOSCOPY WITH BILATERAL RETROGRADE AND BILATERAL URETERAL  CATHETER PLACEMENT;  Surgeon: Lucas Mallow, MD;  Location: [facility];  Service: Urology;  Laterality: Bilateral;  . DIAGNOSTIC LAPAROSCOPY    . DILATION AND CURETTAGE OF UTERUS    . JOINT REPLACEMENT    . KNEE ARTHROSCOPY Bilateral   . LAPAROSCOPIC CHOLECYSTECTOMY    . POLYPECTOMY  01/17/2018   Procedure: POLYPECTOMY;  Surgeon: Milus Banister, MD;  Location: [facility];  Service: Endoscopy;;  . REVISION TOTAL KNEE ARTHROPLASTY Right   . SPINAL CORD STIMULATOR IMPLANT  12/10/15  . SPINAL CORD STIMULATOR REMOVAL N/A 04/10/2019   Procedure: Spinal cord stimulator removal, thoracic paddle and generator;  Surgeon: Kristeen Miss, MD;  Location: [facility];  Service: Neurosurgery;  Laterality: N/A;  Spinal cord stimulator removal, thoracic paddle and generator  . TAKE DOWN OF INTESTINAL FISTULA N/A 03/20/2018   Procedure: LAPAROSCOPIC TAKE DOWN OF COLOVESICAL FISTULA;  Surgeon: Ileana Roup, MD;  Location: [facility];  Service: General;  Laterality: N/A;  . TONSILLECTOMY    . TOTAL KNEE ARTHROPLASTY Bilateral     There were no vitals filed for this visit.  Subjective Assessment - 07/07/19 1725    Subjective  Pt states she feels worse.  States she felt better initially with the breathing exercise.    Currently in Pain?  Yes    Pain Score  8     Pain Location  Back    Pain Orientation  Mid;Lower    Multiple Pain Sites  No                       [facility] Adult PT Treatment/Exercise - 07/07/19 0001      Neuro Re-ed    Neuro Re-ed Details   reviewed breathing and activation of TrA with exhale      Exercises   Exercises  Lumbar      Lumbar Exercises: Supine   Clam  20 reps    Bent Knee Raise  5 reps    Bridge with Ball Squeeze Limitations  ball squeeze - 10x (had some increased pain in Rt groin but it did not persist)    Other Supine Lumbar Exercises  leg lengthening; cervical retraction; scap retraction - spinal decompression exercises - 10x 5 sec hold      Lumbar  Exercises: Sidelying   Clam  10 reps      Manual Therapy   Manual Therapy  Soft tissue mobilization    Manual therapy comments  sidelying on Rt side    Soft tissue mobilization  left gluteals and sacral traction; bilat lumbar paraspinals               PT Short Term Goals - 07/02/19 1045      PT SHORT TERM GOAL #1   Title  pt will perform TUG in <19 sec for reduced risk of falls    Time  6    Period  Weeks    Status  New  Target Date  08/12/19      PT SHORT TERM GOAL #2   Baseline  Pt will be able to peform 6MWT with LRAD    Time  6    Period  Weeks    Status  New    Target Date  08/12/19      PT SHORT TERM GOAL #3   Title  Pt will report 25% less pain overall    Time  6    Period  Weeks    Status  New    Target Date  08/12/19        PT Long Term Goals - 07/02/19 1041      PT LONG TERM GOAL #1   Title  Pt will reduce TUG to <15 sec with LRAD for reduced risk of falls    Time  12    Period  Weeks    Status  New    Target Date  09/23/19      PT LONG TERM GOAL #2   Title  Pt will be able to ambulate with LRAD for at least 15 minutes in order to do grocery shopping    Time  12    Period  Weeks    Status  New    Target Date  09/23/19      PT LONG TERM GOAL #3   Title  Pt will report pain level up to 6/10 at the most during typical ADLs    Time  12    Period  Weeks    Status  New    Target Date  09/23/19      PT LONG TERM GOAL #4   Title  LE and core strength improved in order to perform self care in the morning such as cleaning and dressing without any assisstance    Time  12    Period  Weeks    Status  New    Target Date  09/23/19      PT LONG TERM GOAL #5   Title  Pt will be ind with advanced HEP in order to maintain strength and manage low back pain after discharge from therapy    Time  12    Period  Weeks    Status  New    Target Date  09/23/19            Plan - 07/07/19 1726    Clinical Impression Statement  Pt states she has  only been doing what she has to do.  Pt was educated in moving and walking as much as possible due to stiffness increasing the pain.  Pt was provided exercises for spinal decompression and basic core/hip strengtening.  Pt has difficulty lying hook lying due to abuction weakness on Lt>Rt.  Pt will benefit from skilled PT to address weakness for improved gait and posture.    PT Treatment/Interventions  ADLs/Self Care Home Management;Aquatic Therapy;Biofeedback;Cryotherapy;Electrical Stimulation;Moist Heat;Traction;Neuromuscular re-education;Therapeutic exercise;Therapeutic activities;Stair training;Gait training;Patient/family education;Manual techniques;Dry needling;Passive range of motion;Taping    PT Next Visit Plan  h/s stretch, nustep for enduranc, core and LE strength    PT Home Exercise Plan  Access Code: [email or web address]    Consulted and Agree with Plan of Care  Patient       Patient will benefit from skilled therapeutic intervention in order to improve the following deficits and impairments:  Abnormal gait, Decreased range of motion, Difficulty walking, Increased muscle spasms, Pain, Decreased activity tolerance, Impaired flexibility, Decreased strength, Decreased balance, Postural dysfunction  Visit Diagnosis: Other abnormalities of gait and mobility  Chronic midline low back pain, unspecified whether sciatica present  Muscle weakness (generalized)  Difficulty in walking, not elsewhere classified     Problem List Patient Active Problem List   Diagnosis Date Noted  . Class 2 drug-induced obesity with serious comorbidity and body mass index (BMI) of 36.0 to 36.9 in adult 02/19/2019  . S/P insertion of spinal cord stimulator 01/09/2019  . Spondylosis without myelopathy or radiculopathy, lumbar region 01/09/2019  . 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 Coronary artery disease 10/17/2018  . Mesenteric mass (lipoma) s/p excision 03/20/2018 03/25/2018  . Diverticulitis of sigmoid colon  03/25/2018  . S/P laparoscopic-assisted sigmoidectomy 03/20/2018  . Colonic mass 03/20/2018  . History of adenomatous polyp of colon   . Polyp of ascending colon   . 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 Orthostatic hypotension 09/24/2017  . Visit for monitoring Tikosyn therapy 07/23/2017  . Depression 06/15/2017  . Acute respiratory failure with hypoxia ([place])   . Hypothyroidism   . Bronchitis 03/22/2016  . Abnormal CT of the chest 03/16/2016  . Coronary artery disease involving coronary bypass graft of native heart with angina pectoris ([place]) 03/16/2016  . Chronic anticoagulation 10/06/2015  . On dofetilide therapy 10/06/2015  . Atrial fibrillation ([place]) 09/15/2015  . Snoring 02/17/2015  . Chronic low back pain 11/09/2014  . Disc degeneration, lumbar 11/09/2014  . Addison disease ([place]) 09/30/2014  . Chronic back pain 09/30/2014  . Hematochezia-(Hgb stable) 09/03/2014  . DOE (dyspnea on exertion) 09/01/2014  . Lumbar radiculopathy 04/27/2014  . Osteoarthritis 11/18/2008  . Hyperlipidemia 11/17/2008  . Essential hypertension 11/17/2008    Jule Ser, PT 07/07/2019, 5:34 PM  [facility] [address] [place], [place], [address] Phone: [phone]   Fax:  [phone]  Name: JASLINE CASTELLINI MRN: [id number] Date of Birth: [birth year]

## 2019-07-07 NOTE — Patient Instructions (Addendum)
Trigger Point Dry Needling  . What is Trigger Point Dry Needling (DN)? o DN is a physical therapy technique used to treat muscle pain and dysfunction. Specifically, DN helps deactivate muscle trigger points (muscle knots).  o A thin filiform needle is used to penetrate the skin and stimulate the underlying trigger point. The goal is for a local twitch response (LTR) to occur and for the trigger point to relax. No medication of any kind is injected during the procedure.   . What Does Trigger Point Dry Needling Feel Like?  o The procedure feels different for each individual patient. Some patients report that they do not actually feel the needle enter the skin and overall the process is not painful. Very mild bleeding may occur. However, many patients feel a deep cramping in the muscle in which the needle was inserted. This is the local twitch response.   [name] How Will I feel after the treatment? o Soreness is normal, and the onset of soreness may not occur for a few hours. Typically this soreness does not last longer than two days.  o Bruising is uncommon, however; ice can be used to decrease any possible bruising.  o In rare cases feeling tired or nauseous after the treatment is normal. In addition, your symptoms may get worse before they get better, this period will typically not last longer than 24 hours.   . What Can I do After My Treatment? o Increase your hydration by drinking more water for the next 24 hours. o You may place ice or heat on the areas treated that have become sore, however, do not use heat on inflamed or bruised areas. Heat often brings more relief post needling. o You can continue your regular activities, but vigorous activity is not recommended initially after the treatment for 24 hours. o DN is best combined with other physical therapy such as strengthening, stretching, and other therapies.    [facility] [address], [place], [place]  [address] Phone # [phone] Fax [phone]  Decompression Exercise: Basic   Lie on back on firm surface, knees bent, feet flat, arms turned up, out to sides, backs of hands down. Time 3-5___ minutes. Surface: floor   Copyright  VHI. All rights reserved.  Shoulder Press   Press both shoulders down. Hold _3-5__ seconds. Repeat _5-10__ times. Surface: floor   Copyright  VHI. All rights reserved.  Head Press With [place] chin SLIGHTLY toward chest, keep mouth closed. Feel weight on back of head. Increase weight by pressing head down. Hold _3-5_ seconds. Relax. Repeat _5-10__ times. Surface: floor   Copyright  VHI. All rights reserved.  RE-ALIGNMENT ROUTINE EXERCISES-OSTEOPROROSIS BASIC FOR POSTURAL CORRECTION   RE-ALIGNMENT Tips BENEFITS: 1.It helps to re-align the curves of the back and improve standing posture. 2.It allows the back muscles to rest and strengthen in preparation for more activity. FREQUENCY: Daily, even after weeks, months and years of more advanced exercises. START: 1.All exercises start in the same position: lying on the back, arms resting on the supporting surface, palms up and slightly away from the body, backs of hands down, knees bent, feet flat. 2.The head, neck, arms, and legs are supported according to specific instructions of your therapist. Copyright  VHI. All rights reserved.    1. Decompression Exercise: Basic.   Takes compression off the vertebral bodies; increases tolerance for lying on the back; helps relieve back pain   Lie on back on firm surface, knees bent,  feet flat, arms turned up, out to sides (~35 degrees). Head neck and arms supported as necessary. Time _5-15__ minutes. Surface: floor     2. Shoulder Press  Strengthens upper back extensors and scapular retractors.   Press both shoulders down. Hold _2-3__ seconds. Repeat _3-5__ times. Surface: floor        3. Head Press With [place]  Strengthens neck  extensors   Tuck chin SLIGHTLY toward chest, keep mouth closed. Feel weight on back of head. Increase weight by pressing head down. Hold _2-3__ seconds. Relax. Repeat 3-5___ times. Surface: floor   4. Leg Lengthener: stretches quadratus lumborum and hip flexors.  Strengthens quads and ankle dorsiflexors.  Lying on back one leg straight.  Press that heel towards the wall hold 3-5 sec repeat 8- 10x on each leg

## 2019-07-09 ENCOUNTER — Ambulatory Visit: Payer: Medicare PPO

## 2019-07-09 ENCOUNTER — Other Ambulatory Visit: Payer: Self-pay

## 2019-07-09 DIAGNOSIS — R2689 Other abnormalities of gait and mobility: Secondary | ICD-10-CM | POA: Diagnosis not present

## 2019-07-09 DIAGNOSIS — R262 Difficulty in walking, not elsewhere classified: Secondary | ICD-10-CM

## 2019-07-09 DIAGNOSIS — G8929 Other chronic pain: Secondary | ICD-10-CM

## 2019-07-09 DIAGNOSIS — M545 Low back pain, unspecified: Secondary | ICD-10-CM

## 2019-07-09 DIAGNOSIS — M6281 Muscle weakness (generalized): Secondary | ICD-10-CM

## 2019-07-09 NOTE — Therapy (Signed)
[facility] [address] [place], [place], [address] Phone: [phone]   Fax:  [phone]  Physical Therapy Treatment  Patient Details  Name: [name] MRN: [id number] Date of Birth: [birth year] Referring Provider (PT): Crist Infante   Encounter Date: 07/09/2019  PT End of Session - 07/09/19 1700    Visit Number  3    Date for PT Re-Evaluation  09/23/19    PT Start Time  [id number]    PT Stop Time  1658    PT Time Calculation (min)  42 min    Activity Tolerance  Patient tolerated treatment well;Patient limited by pain    Behavior During Therapy  [facility] for tasks assessed/performed       Past Medical History:  Diagnosis Date  . Addison's disease ([place])   . Anxiety   . Arthritis    "everywhere"  . Atrial fibrillation (HCC)    Eliquis  . B12 deficiency   . Back pain   . CHF (congestive heart failure) ([place])   . Chronic bronchitis ([place])    "although I didn't get it this year" (07/23/2017)  . Colostomy in place [facility])   . 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  last knee surgery dr. Elmyra Ricks gave large dose of hydrocortisal and avoided the hypotensive side effectas of addison's disease.  . Coronary artery disease   . Depression   . GERD (gastroesophageal reflux disease)   . Hyperlipemia   . Hypertension   . Hypothyroidism   . Lumbar radiculopathy 04/27/2014  . Migraine    "in my 20's" (07/23/2017)  . Osteoarthritis   . Palpitations   . Recurrent upper respiratory infection (URI)   . Sleep apnea    uses a cpap   . SOB (shortness of breath)   . UTI (lower urinary tract infection) 07/09/2011  . Vitamin D deficiency     Past Surgical History:  Procedure Laterality Date  . ACHILLES TENDON SURGERY Left 07/16/2014   bone spur removed  . ANTERIOR LUMBAR FUSION  1980   L4-5  . BACK SURGERY    . CARDIAC CATHETERIZATION  2013  . CARDIAC CATHETERIZATION N/A 09/02/2014   Procedure: Left Heart Cath and Coronary Angiography;  Surgeon: Wellington Hampshire, MD;  Location: [place] CV LAB;  Service: Cardiovascular;  Laterality: N/A;  . CARDIOVERSION N/A 10/22/2015   Procedure: CARDIOVERSION;  Surgeon: Sanda Klein, MD;  Location: [place] ENDOSCOPY;  Service: Cardiovascular;  Laterality: N/A;  . CARDIOVERSION N/A 06/07/2017   Procedure: CARDIOVERSION;  Surgeon: Larey Dresser, MD;  Location: [facility] ENDOSCOPY;  Service: Cardiovascular;  Laterality: N/A;  . CARDIOVERSION N/A 07/05/2017   Procedure: CARDIOVERSION;  Surgeon: Pixie Casino, MD;  Location: [facility] ENDOSCOPY;  Service: Cardiovascular;  Laterality: N/A;  . CARDIOVERSION N/A 09/30/2018   Procedure: CARDIOVERSION;  Surgeon: Buford Dresser, MD;  Location: [facility] ENDOSCOPY;  Service: Cardiovascular;  Laterality: N/A;  . CARDIOVERSION N/A 11/26/2018   Procedure: CARDIOVERSION;  Surgeon: Sanda Klein, MD;  Location: [facility] ENDOSCOPY;  Service: Cardiovascular;  Laterality: N/A;  . CARPAL TUNNEL RELEASE Right   . CATARACT EXTRACTION, BILATERAL Bilateral   . COLON RESECTION N/A 03/20/2018   Procedure: LAPAROSCOPIC SIGMOIDECTOMY WITH COLOSTOMY;  Surgeon: Ileana Roup, MD;  Location: [facility] ORS;  Service: General;  Laterality: N/A;  . COLON SURGERY     laparascopic vs. open sigmoidectomy with cystoscopy  Dr. Dema Severin and Dr. Gloriann Loan 03-20-18  . COLONOSCOPY WITH PROPOFOL N/A  01/17/2018   Procedure: COLONOSCOPY WITH PROPOFOL;  Surgeon: Milus Banister, MD;  [facility] ENDOSCOPY;  Service: Endoscopy;  Laterality: N/A;  . CORONARY ARTERY BYPASS GRAFT  06/26/2011   Procedure: CORONARY ARTERY BYPASS GRAFTING (CABG);  Surgeon: Melrose Nakayama, MD;  Location: [place];  Service: Open Heart Surgery;  Laterality: N/A;  times using Greater Saphenous Vein Graft harvested endoscopically from right leg  . CYSTOSCOPY W/ URETERAL STENT PLACEMENT Bilateral 03/20/2018   Procedure: CYSTOSCOPY WITH BILATERAL RETROGRADE AND BILATERAL URETERAL  CATHETER PLACEMENT;  Surgeon: Lucas Mallow, MD;  [facility];  Service: Urology;  Laterality: Bilateral;  . DIAGNOSTIC LAPAROSCOPY    . DILATION AND CURETTAGE OF UTERUS    . JOINT REPLACEMENT    . KNEE ARTHROSCOPY Bilateral   . LAPAROSCOPIC CHOLECYSTECTOMY    . POLYPECTOMY  01/17/2018   Procedure: POLYPECTOMY;  Surgeon: Milus Banister, MD;  Location: [facility];  Service: Endoscopy;;  . REVISION TOTAL KNEE ARTHROPLASTY Right   . SPINAL CORD STIMULATOR IMPLANT  12/10/15  . SPINAL CORD STIMULATOR REMOVAL N/A 04/10/2019   Procedure: Spinal cord stimulator removal, thoracic paddle and generator;  Surgeon: Kristeen Miss, MD;  Location: [place];  Service: Neurosurgery;  Laterality: N/A;  Spinal cord stimulator removal, thoracic paddle and generator  . TAKE DOWN OF INTESTINAL FISTULA N/A 03/20/2018   Procedure: LAPAROSCOPIC TAKE DOWN OF COLOVESICAL FISTULA;  Surgeon: Ileana Roup, MD;  [facility];  Service: General;  Laterality: N/A;  . TONSILLECTOMY    . TOTAL KNEE ARTHROPLASTY Bilateral     There were no vitals filed for this visit.  Subjective Assessment - 07/09/19 1617    Subjective  My thoracic spine is really hurting today.    Currently in Pain?  Yes    Pain Score  8     Pain Location  Back    Pain Orientation  Mid;Lower    Pain Descriptors / Indicators  Sharp;Shooting    Pain Type  Chronic pain    Pain Onset  More than a month ago    Pain Frequency  Intermittent    Aggravating Factors   I just wake up with it    Pain Relieving Factors  not much                       [facility] Adult PT Treatment/Exercise - 07/09/19 0001      Manual Therapy   Manual Therapy  Soft tissue mobilization    Manual therapy comments  bil thoracic paraspinals, lumbar paraspinals and bil gluteals       Trigger Point Dry Needling - 07/09/19 0001    Consent Given?  Yes    Education Handout Provided  Previously provided    Muscles Treated Back/Hip  Lumbar  multifidi;Gluteus minimus;Gluteus medius    Dry Needling Comments  Thoracic multifidi    Gluteus Minimus Response  Twitch response elicited;Palpable increased muscle length    Gluteus Medius Response  Twitch response elicited;Palpable increased muscle length    Lumbar multifidi Response  Twitch response elicited;Palpable increased muscle length             PT Short Term Goals - 07/02/19 1045      PT SHORT TERM GOAL #1   Title  pt will perform TUG in <19 sec for reduced risk of falls    Time  6    Period  Weeks    Status  New    Target Date  08/12/19      PT SHORT TERM GOAL #2   Baseline  Pt will be able to peform 6MWT with LRAD    Time  6    Period  Weeks    Status  New    Target Date  08/12/19      PT SHORT TERM GOAL #3   Title  Pt will report 25% less pain overall    Time  6    Period  Weeks    Status  New    Target Date  08/12/19        PT Long Term Goals - 07/02/19 1041      PT LONG TERM GOAL #1   Title  Pt will reduce TUG to <15 sec with LRAD for reduced risk of falls    Time  12    Period  Weeks    Status  New    Target Date  09/23/19      PT LONG TERM GOAL #2   Title  Pt will be able to ambulate with LRAD for at least 15 minutes in order to do grocery shopping    Time  12    Period  Weeks    Status  New    Target Date  09/23/19      PT LONG TERM GOAL #3   Title  Pt will report pain level up to 6/10 at the most during typical ADLs    Time  12    Period  Weeks    Status  New    Target Date  09/23/19      PT LONG TERM GOAL #4   Title  LE and core strength improved in order to perform self care in the morning such as cleaning and dressing without any assisstance    Time  12    Period  Weeks    Status  New    Target Date  09/23/19      PT LONG TERM GOAL #5   Title  Pt will be ind with advanced HEP in order to maintain strength and manage low back pain after discharge from therapy    Time  12    Period  Weeks    Status  New    Target Date   09/23/19            Plan - 07/09/19 1701    Clinical Impression Statement  Pt entered today in 8/10 LBP and thoracic pain.  Session spent on dry needling to lower thoracic, lumbar and gluteals to address trigger points and tissue mobility.  Pt had good response and reported reduced pain post-session.  Pt was encouraged to continue with HEP for decompression and walk around the house as much as able for endurance gains.  Pt will benefit from skilled PT to address strength, flexibility and tissue mobility.    PT Frequency  2x / week    PT Duration  12 weeks    PT Treatment/Interventions  ADLs/Self Care Home Management;Aquatic Therapy;Biofeedback;Cryotherapy;Electrical Stimulation;Moist Heat;Traction;Neuromuscular re-education;Therapeutic exercise;Therapeutic activities;Stair training;Gait training;Patient/family education;Manual techniques;Dry needling;Passive range of motion;Taping    PT Next Visit Plan  assess response to dry needling and repeat if helpful, strength and endurance as tolerated    PT Home Exercise Plan  Access Code: [facility]    Consulted and Agree with Plan of Care  Patient       Patient will benefit from skilled therapeutic intervention in order to improve the following deficits and impairments:  Abnormal gait, Decreased range of motion, Difficulty walking, Increased muscle spasms, Pain, Decreased activity tolerance, Impaired flexibility, Decreased strength, Decreased balance, Postural dysfunction  Visit Diagnosis: Other abnormalities of gait and mobility  Chronic midline low back pain, unspecified whether sciatica present  Muscle weakness (generalized)  Difficulty in walking, not elsewhere classified     Problem List Patient Active Problem List   Diagnosis Date Noted  . Class 2 drug-induced obesity with serious comorbidity and body mass index (BMI) of 36.0 to 36.9 in adult 02/19/2019  . S/P insertion of spinal cord stimulator 01/09/2019  . Spondylosis without  myelopathy or radiculopathy, lumbar region 01/09/2019  . 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 Coronary artery disease 10/17/2018  . Mesenteric mass (lipoma) s/p excision 03/20/2018 03/25/2018  . Diverticulitis of sigmoid colon 03/25/2018  . S/P laparoscopic-assisted sigmoidectomy 03/20/2018  . Colonic mass 03/20/2018  . History of adenomatous polyp of colon   . Polyp of ascending colon   . 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 Orthostatic hypotension 09/24/2017  . Visit for monitoring Tikosyn therapy 07/23/2017  . Depression 06/15/2017  . Acute respiratory failure with hypoxia ([place])   . Hypothyroidism   . Bronchitis 03/22/2016  . Abnormal CT of the chest 03/16/2016  . Coronary artery disease involving coronary bypass graft of native heart with angina pectoris ([place]) 03/16/2016  . Chronic anticoagulation 10/06/2015  . On dofetilide therapy 10/06/2015  . Atrial fibrillation ([place]) 09/15/2015  . Snoring 02/17/2015  . Chronic low back pain 11/09/2014  . Disc degeneration, lumbar 11/09/2014  . Addison disease ([place]) 09/30/2014  . Chronic back pain 09/30/2014  . Hematochezia-(Hgb stable) 09/03/2014  . DOE (dyspnea on exertion) 09/01/2014  . Lumbar radiculopathy 04/27/2014  . Osteoarthritis 11/18/2008  . Hyperlipidemia 11/17/2008  . Essential hypertension 11/17/2008    Brazil Voytko 07/09/2019, 5:02 PM  [facility] [address], [place], [place], [address] Phone: [phone]   Fax:  [phone]  Name: [name] MRN: [id number] Date of Birth: 22-Apr-1940

## 2019-07-17 ENCOUNTER — Other Ambulatory Visit: Payer: Self-pay

## 2019-07-17 ENCOUNTER — Ambulatory Visit: Payer: Medicare PPO | Admitting: Physical Therapy

## 2019-07-17 DIAGNOSIS — R262 Difficulty in walking, not elsewhere classified: Secondary | ICD-10-CM

## 2019-07-17 DIAGNOSIS — R2689 Other abnormalities of gait and mobility: Secondary | ICD-10-CM

## 2019-07-17 DIAGNOSIS — G8929 Other chronic pain: Secondary | ICD-10-CM

## 2019-07-17 DIAGNOSIS — M6281 Muscle weakness (generalized): Secondary | ICD-10-CM

## 2019-07-17 DIAGNOSIS — M545 Low back pain, unspecified: Secondary | ICD-10-CM

## 2019-07-17 NOTE — Therapy (Signed)
[facility] [address] [place], [place], [address] Phone: [phone]   Fax:  [phone]  Physical Therapy Treatment  Patient Details  Name: [name] MRN: [id number] Date of Birth: 31-Mar-1941 Referring Provider (PT): Crist Infante   Encounter Date: 07/17/2019  PT End of Session - 07/17/19 1613    Visit Number  4    Date for PT Re-Evaluation  09/23/19    PT Start Time  [id number]    PT Stop Time  1613    PT Time Calculation (min)  40 min    Activity Tolerance  Patient tolerated treatment well;Patient limited by pain    Behavior During Therapy  [facility] for tasks assessed/performed       Past Medical History:  Diagnosis Date  . Addison's disease ([place])   . Anxiety   . Arthritis    "everywhere"  . Atrial fibrillation (HCC)    Eliquis  . B12 deficiency   . Back pain   . CHF (congestive heart failure) ([place])   . Chronic bronchitis ([place])    "although I didn't get it this year" (07/23/2017)  . Colostomy in place [facility])   . 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  last knee surgery dr. Elmyra Ricks gave large dose of hydrocortisal and avoided the hypotensive side effectas of addison's disease.  . Coronary artery disease   . Depression   . GERD (gastroesophageal reflux disease)   . Hyperlipemia   . Hypertension   . Hypothyroidism   . Lumbar radiculopathy 04/27/2014  . Migraine    "in my 20's" (07/23/2017)  . Osteoarthritis   . Palpitations   . Recurrent upper respiratory infection (URI)   . Sleep apnea    uses a cpap   . SOB (shortness of breath)   . UTI (lower urinary tract infection) 07/09/2011  . Vitamin D deficiency     Past Surgical History:  Procedure Laterality Date  . ACHILLES TENDON SURGERY Left 07/16/2014   bone spur removed  . ANTERIOR LUMBAR FUSION  1980   L4-5  . BACK SURGERY    . CARDIAC CATHETERIZATION  2013  . CARDIAC CATHETERIZATION N/A 09/02/2014    Procedure: Left Heart Cath and Coronary Angiography;  Surgeon: Wellington Hampshire, MD;  Location: [place] CV LAB;  Service: Cardiovascular;  Laterality: N/A;  . CARDIOVERSION N/A 10/22/2015   Procedure: CARDIOVERSION;  Surgeon: Sanda Klein, MD;  Location: [place] ENDOSCOPY;  Service: Cardiovascular;  Laterality: N/A;  . CARDIOVERSION N/A 06/07/2017   Procedure: CARDIOVERSION;  Surgeon: Larey Dresser, MD;  Location: [facility] ENDOSCOPY;  Service: Cardiovascular;  Laterality: N/A;  . CARDIOVERSION N/A 07/05/2017   Procedure: CARDIOVERSION;  Surgeon: Pixie Casino, MD;  Location: [facility] ENDOSCOPY;  Service: Cardiovascular;  Laterality: N/A;  . CARDIOVERSION N/A 09/30/2018   Procedure: CARDIOVERSION;  Surgeon: Buford Dresser, MD;  Location: [facility]COPY;  Service: Cardiovascular;  Laterality: N/A;  . CARDIOVERSION N/A 11/26/2018   Procedure: CARDIOVERSION;  Surgeon: Sanda Klein, MD;  Location: [facility] ENDOSCOPY;  Service: Cardiovascular;  Laterality: N/A;  . CARPAL TUNNEL RELEASE Right   . CATARACT EXTRACTION, BILATERAL Bilateral   . COLON RESECTION N/A 03/20/2018   Procedure: LAPAROSCOPIC SIGMOIDECTOMY WITH COLOSTOMY;  Surgeon: Ileana Roup, MD;  [facility];  Service: General;  Laterality: N/A;  . COLON SURGERY     laparascopic vs. open sigmoidectomy with cystoscopy  Dr. Dema Severin and Dr. Gloriann Loan 03-20-18  . COLONOSCOPY WITH PROPOFOL  N/A 01/17/2018   Procedure: COLONOSCOPY WITH PROPOFOL;  Surgeon: Milus Banister, MD;  [facility];  Service: Endoscopy;  Laterality: N/A;  . CORONARY ARTERY BYPASS GRAFT  06/26/2011   Procedure: CORONARY ARTERY BYPASS GRAFTING (CABG);  Surgeon: Melrose Nakayama, MD;  Location: [place];  Service: Open Heart Surgery;  Laterality: N/A;  times using Greater Saphenous Vein Graft harvested endoscopically from right leg  . CYSTOSCOPY W/ URETERAL STENT PLACEMENT Bilateral 03/20/2018   Procedure: CYSTOSCOPY WITH BILATERAL RETROGRADE AND BILATERAL URETERAL  CATHETER PLACEMENT;  Surgeon: Lucas Mallow, MD;  [facility];  Service: Urology;  Laterality: Bilateral;  . DIAGNOSTIC LAPAROSCOPY    . DILATION AND CURETTAGE OF UTERUS    . JOINT REPLACEMENT    . KNEE ARTHROSCOPY Bilateral   . LAPAROSCOPIC CHOLECYSTECTOMY    . POLYPECTOMY  01/17/2018   Procedure: POLYPECTOMY;  Surgeon: Milus Banister, MD;  Location: [facility];  Service: Endoscopy;;  . REVISION TOTAL KNEE ARTHROPLASTY Right   . SPINAL CORD STIMULATOR IMPLANT  12/10/15  . SPINAL CORD STIMULATOR REMOVAL N/A 04/10/2019   Procedure: Spinal cord stimulator removal, thoracic paddle and generator;  Surgeon: Kristeen Miss, MD;  Location: [place];  Service: Neurosurgery;  Laterality: N/A;  Spinal cord stimulator removal, thoracic paddle and generator  . TAKE DOWN OF INTESTINAL FISTULA N/A 03/20/2018   Procedure: LAPAROSCOPIC TAKE DOWN OF COLOVESICAL FISTULA;  Surgeon: Ileana Roup, MD;  [facility];  Service: General;  Laterality: N/A;  . TONSILLECTOMY    . TOTAL KNEE ARTHROPLASTY Bilateral     There were no vitals filed for this visit.  Subjective Assessment - 07/17/19 1605    Subjective  Pt had 8-9/10 pain when walking in.  Sitting right now there is no pain.  Ambulates without rollator today and reports the pain is excruciating    Currently in Pain?  No/denies                       [facility] Adult PT Treatment/Exercise - 07/17/19 0001      Neuro Re-ed    Neuro Re-ed Details   core engaged with all exercises - moist heat lumbar while sitting thoughout treatment      Lumbar Exercises: Aerobic   Nustep  L3 x 10 min PT present for status update      Lumbar Exercises: Seated   Long Arc Quad on Chair  Strengthening;Both;2 sets;10 reps;Weights    LAQ on Chair Weights (lbs)  2    Hip Flexion on Ball  Strengthening;Both;20 reps    Hip Flexion on Ball Limitations  sitting in chair - yellow ball    Other Seated Lumbar Exercises  clam red band; hip ER red band -  20x    Other Seated Lumbar Exercises  ball squeeze 20x (increased back pain last few reps) - 20x      Shoulder Exercises: Seated   Horizontal ABduction  Strengthening;Both;20 reps    Theraband Level (Shoulder Horizontal ABduction)  Level 2 (Red)    Flexion  Strengthening;Both;20 reps;Weights    Flexion Weight (lbs)  4   4 lb weighted ball   Diagonals  Strengthening;Both;20 reps;Theraband    Theraband Level (Shoulder Diagonals)  Level 2 (Red)             PT Education - 07/17/19 1613    Education Details  Access Code: [email or web address]    Person(s) Educated  Patient    Methods  Explanation;Demonstration;Tactile cues;Verbal cues;Handout  Comprehension  Verbalized understanding;Returned demonstration       PT Short Term Goals - 07/02/19 1045      PT SHORT TERM GOAL #1   Title  pt will perform TUG in <19 sec for reduced risk of falls    Time  6    Period  Weeks    Status  New    Target Date  08/12/19      PT SHORT TERM GOAL #2   Baseline  Pt will be able to peform 6MWT with LRAD    Time  6    Period  Weeks    Status  New    Target Date  08/12/19      PT SHORT TERM GOAL #3   Title  Pt will report 25% less pain overall    Time  6    Period  Weeks    Status  New    Target Date  08/12/19        PT Long Term Goals - 07/02/19 1041      PT LONG TERM GOAL #1   Title  Pt will reduce TUG to <15 sec with LRAD for reduced risk of falls    Time  12    Period  Weeks    Status  New    Target Date  09/23/19      PT LONG TERM GOAL #2   Title  Pt will be able to ambulate with LRAD for at least 15 minutes in order to do grocery shopping    Time  12    Period  Weeks    Status  New    Target Date  09/23/19      PT LONG TERM GOAL #3   Title  Pt will report pain level up to 6/10 at the most during typical ADLs    Time  12    Period  Weeks    Status  New    Target Date  09/23/19      PT LONG TERM GOAL #4   Title  LE and core strength improved in order to perform self care in  the morning such as cleaning and dressing without any assisstance    Time  12    Period  Weeks    Status  New    Target Date  09/23/19      PT LONG TERM GOAL #5   Title  Pt will be ind with advanced HEP in order to maintain strength and manage low back pain after discharge from therapy    Time  12    Period  Weeks    Status  New    Target Date  09/23/19            Plan - 07/17/19 1718    Clinical Impression Statement  Pt reports no change in pain and wants to work on strengthening.  Today focused on seated exercises due to severe pain in standing and patient is going to aquatic therapy tomorrow.  Pt fatigues and becomes out of breath during exercises but no increased pain.  Pt has noticeably more weakness on Lt LE and becomes fatigued more quickly on that side. Pt has a stoma on the Lt side so seems to be related to the weakness in her core.  Pt will benefit from skilled PT to continue to address strength and posture deficits for reduced pain with standing and walking.    PT Treatment/Interventions  ADLs/Self Care Home Management;Aquatic Therapy;Biofeedback;Cryotherapy;Dealer  Stimulation;Moist Heat;Traction;Neuromuscular re-education;Therapeutic exercise;Therapeutic activities;Stair training;Gait training;Patient/family education;Manual techniques;Dry needling;Passive range of motion;Taping    PT Next Visit Plan  f/u on exercises added toHEP; LE strength and stability; posture and gait    PT Home Exercise Plan  Access Code: [email or web address]    Consulted and Agree with Plan of Care  Patient       Patient will benefit from skilled therapeutic intervention in order to improve the following deficits and impairments:  Abnormal gait, Decreased range of motion, Difficulty walking, Increased muscle spasms, Pain, Decreased activity tolerance, Impaired flexibility, Decreased strength, Decreased balance, Postural dysfunction  Visit Diagnosis: Other abnormalities of gait and mobility  Chronic  midline low back pain, unspecified whether sciatica present  Muscle weakness (generalized)  Difficulty in walking, not elsewhere classified     Problem List Patient Active Problem List   Diagnosis Date Noted  . Class 2 drug-induced obesity with serious comorbidity and body mass index (BMI) of 36.0 to 36.9 in adult 02/19/2019  . S/P insertion of spinal cord stimulator 01/09/2019  . Spondylosis without myelopathy or radiculopathy, lumbar region 01/09/2019  . 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 Coronary artery disease 10/17/2018  . Mesenteric mass (lipoma) s/p excision 03/20/2018 03/25/2018  . Diverticulitis of sigmoid colon 03/25/2018  . S/P laparoscopic-assisted sigmoidectomy 03/20/2018  . Colonic mass 03/20/2018  . History of adenomatous polyp of colon   . Polyp of ascending colon   . 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 Orthostatic hypotension 09/24/2017  . Visit for monitoring Tikosyn therapy 07/23/2017  . Depression 06/15/2017  . Acute respiratory failure with hypoxia ([place])   . Hypothyroidism   . Bronchitis 03/22/2016  . Abnormal CT of the chest 03/16/2016  . Coronary artery disease involving coronary bypass graft of native heart with angina pectoris ([place]) 03/16/2016  . Chronic anticoagulation 10/06/2015  . On dofetilide therapy 10/06/2015  . Atrial fibrillation ([place]) 09/15/2015  . Snoring 02/17/2015  . Chronic low back pain 11/09/2014  . Disc degeneration, lumbar 11/09/2014  . Addison disease ([place]) 09/30/2014  . Chronic back pain 09/30/2014  . Hematochezia-(Hgb stable) 09/03/2014  . DOE (dyspnea on exertion) 09/01/2014  . Lumbar radiculopathy 04/27/2014  . Osteoarthritis 11/18/2008  . Hyperlipidemia 11/17/2008  . Essential hypertension 11/17/2008    Jule Ser, PT 07/17/2019, 5:26 PM  [facility] [address], [place], [place], [address] Phone:  [phone]   Fax:  [phone]  Name: HANNA FEIT MRN: [id number] Date of Birth: 1940/06/10

## 2019-07-17 NOTE — Patient Instructions (Signed)
Access Code: [email or web address] URL: [email or web address] Date: 07/17/2019 Prepared by: Jari Favre  Exercises Hooklying Rib Cage Breathing - 1 x daily - 7 x weekly - 3 sets - 10 reps Clamshell - 1 x daily - 7 x weekly - 10 reps - 3 sets Seated Shoulder Flexion with Dumbbells - 1 x daily - 7 x weekly - 10 reps - 3 sets Seated Shoulder Horizontal Abduction with Resistance - 1 x daily - 7 x weekly - 10 reps - 3 sets Seated March - 1 x daily - 7 x weekly - 10 reps - 3 sets Seated Long Arc Quad - 1 x daily - 7 x weekly - 10 reps - 3 sets Seated Hip Abduction with Resistance - 1 x daily - 7 x weekly - 10 reps - 3 sets

## 2019-07-18 ENCOUNTER — Ambulatory Visit: Payer: Medicare PPO | Admitting: Physical Therapy

## 2019-07-18 ENCOUNTER — Encounter: Payer: Self-pay | Admitting: Physical Therapy

## 2019-07-18 DIAGNOSIS — M6281 Muscle weakness (generalized): Secondary | ICD-10-CM

## 2019-07-18 DIAGNOSIS — R2689 Other abnormalities of gait and mobility: Secondary | ICD-10-CM | POA: Diagnosis not present

## 2019-07-18 DIAGNOSIS — M545 Low back pain, unspecified: Secondary | ICD-10-CM

## 2019-07-18 DIAGNOSIS — R262 Difficulty in walking, not elsewhere classified: Secondary | ICD-10-CM

## 2019-07-18 DIAGNOSIS — G8929 Other chronic pain: Secondary | ICD-10-CM

## 2019-07-18 NOTE — Therapy (Signed)
[employer] [facility] [address], [place], [place], [address] Phone: [phone]   Fax:  [phone]  Physical Therapy Treatment  Patient Details  Name: [name] MRN: [id number] Date of Birth: 06/26/1940 Referring Provider (PT): Crist Infante   Encounter Date: 07/18/2019  PT End of Session - 07/18/19 1516    Visit Number  5    Date for PT Re-Evaluation  09/23/19    PT Start Time  1205    PT Stop Time  1244    PT Time Calculation (min)  39 min    Activity Tolerance  Patient tolerated treatment well    Behavior During Therapy  [facility] for tasks assessed/performed       Past Medical History:  Diagnosis Date  . Addison's disease ([place])   . Anxiety   . Arthritis    "everywhere"  . Atrial fibrillation (HCC)    Eliquis  . B12 deficiency   . Back pain   . CHF (congestive heart failure) ([place])   . Chronic bronchitis ([place])    "although I didn't get it this year" (07/23/2017)  . Colostomy in place [facility])   . 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  last knee surgery dr. Elmyra Ricks gave large dose of hydrocortisal and avoided the hypotensive side effectas of addison's disease.  . Coronary artery disease   . Depression   . GERD (gastroesophageal reflux disease)   . Hyperlipemia   . Hypertension   . Hypothyroidism   . Lumbar radiculopathy 04/27/2014  . Migraine    "in my 20's" (07/23/2017)  . Osteoarthritis   . Palpitations   . Recurrent upper respiratory infection (URI)   . Sleep apnea    uses a cpap   . SOB (shortness of breath)   . UTI (lower urinary tract infection) 07/09/2011  . Vitamin D deficiency     Past Surgical History:  Procedure Laterality Date  . ACHILLES TENDON SURGERY Left 07/16/2014   bone spur removed  . ANTERIOR LUMBAR FUSION  1980   L4-5  . BACK SURGERY    . CARDIAC CATHETERIZATION  2013  . CARDIAC CATHETERIZATION N/A 09/02/2014   Procedure: Left Heart  Cath and Coronary Angiography;  Surgeon: Wellington Hampshire, MD;  Location: [facility] CV LAB;  Service: Cardiovascular;  Laterality: N/A;  . CARDIOVERSION N/A 10/22/2015   Procedure: CARDIOVERSION;  Surgeon: Sanda Klein, MD;  Location: [facility] ENDOSCOPY;  Service: Cardiovascular;  Laterality: N/A;  . CARDIOVERSION N/A 06/07/2017   Procedure: CARDIOVERSION;  Surgeon: Larey Dresser, MD;  Location: [facility] ENDOSCOPY;  Service: Cardiovascular;  Laterality: N/A;  . CARDIOVERSION N/A 07/05/2017   Procedure: CARDIOVERSION;  Surgeon: Pixie Casino, MD;  Location: [facility] ENDOSCOPY;  Service: Cardiovascular;  Laterality: N/A;  . CARDIOVERSION N/A 09/30/2018   Procedure: CARDIOVERSION;  Surgeon: Buford Dresser, MD;  Location: [facility] ENDOSCOPY;  Service: Cardiovascular;  Laterality: N/A;  . CARDIOVERSION N/A 11/26/2018   Procedure: CARDIOVERSION;  Surgeon: Sanda Klein, MD;  Location: [facility] ENDOSCOPY;  Service: Cardiovascular;  Laterality: N/A;  . CARPAL TUNNEL RELEASE Right   . CATARACT EXTRACTION, BILATERAL Bilateral   . COLON RESECTION N/A 03/20/2018   Procedure: LAPAROSCOPIC SIGMOIDECTOMY WITH COLOSTOMY;  Surgeon: Ileana Roup, MD;  Location: [facility] ORS;  Service: General;  Laterality: N/A;  . COLON SURGERY     laparascopic vs. open sigmoidectomy with cystoscopy  Dr. Dema Severin and Dr. Gloriann Loan 03-20-18  . COLONOSCOPY WITH PROPOFOL N/A 01/17/2018  Procedure: COLONOSCOPY WITH PROPOFOL;  Surgeon: Milus Banister, MD;  Location: [facility] ENDOSCOPY;  Service: Endoscopy;  Laterality: N/A;  . CORONARY ARTERY BYPASS GRAFT  06/26/2011   Procedure: CORONARY ARTERY BYPASS GRAFTING (CABG);  Surgeon: Melrose Nakayama, MD;  Location: [place];  Service: Open Heart Surgery;  Laterality: N/A;  times using Greater Saphenous Vein Graft harvested endoscopically from right leg  . CYSTOSCOPY W/ URETERAL STENT PLACEMENT Bilateral 03/20/2018   Procedure: CYSTOSCOPY WITH BILATERAL RETROGRADE AND BILATERAL URETERAL CATHETER PLACEMENT;   Surgeon: Lucas Mallow, MD;  Location: [facility] ORS;  Service: Urology;  Laterality: Bilateral;  . DIAGNOSTIC LAPAROSCOPY    . DILATION AND CURETTAGE OF UTERUS    . JOINT REPLACEMENT    . KNEE ARTHROSCOPY Bilateral   . LAPAROSCOPIC CHOLECYSTECTOMY    . POLYPECTOMY  01/17/2018   Procedure: POLYPECTOMY;  Surgeon: Milus Banister, MD;  Location: [facility];  Service: Endoscopy;;  . REVISION TOTAL KNEE ARTHROPLASTY Right   . SPINAL CORD STIMULATOR IMPLANT  12/10/15  . SPINAL CORD STIMULATOR REMOVAL N/A 04/10/2019   Procedure: Spinal cord stimulator removal, thoracic paddle and generator;  Surgeon: Kristeen Miss, MD;  Location: [place];  Service: Neurosurgery;  Laterality: N/A;  Spinal cord stimulator removal, thoracic paddle and generator  . TAKE DOWN OF INTESTINAL FISTULA N/A 03/20/2018   Procedure: LAPAROSCOPIC TAKE DOWN OF COLOVESICAL FISTULA;  Surgeon: Ileana Roup, MD;  Location: [facility] ORS;  Service: General;  Laterality: N/A;  . TONSILLECTOMY    . TOTAL KNEE ARTHROPLASTY Bilateral     There were no vitals filed for this visit.  Subjective Assessment - 07/18/19 1514    Subjective  My back constantly hurts, sometimes low level, sometimes higher. Ambulates with RW today. Pain "not too bad" right now.    Currently in Pain?  No/denies   Pt seated and typically has no pain when sitting   Multiple Pain Sites  No        Treatment: Aquatic session Water temp 87.6 degrees, Pt entered & exited pool via 4 steps, slowly using 2 hand rails. Pt in 3-4 feet of water for duration of session.  Sitting: 75% submersion at water bench. Pain assessment/update. LE AROM routine instructed by PTA that pt will do as a warm up and to get used to the water temperature. Pt also verbally educated in water principles she will be using during her sessions. Pt verbally understood principles.  Standing at mid waist depth: 1/4 length of the pool 3x water walking in all directions Blue noodle press standing  against the pool wall for core contraction 3 sec 2x6. Then added marching 1/4 length of pool with same noodle press. Decompression float x 1 min using blue noodle: difficult for pt to control her trunk. Standing: Heels raises 10x, Hip abd bil 10x, hip circles 10x bil, hip flex/ext 10x bil                         PT Short Term Goals - 07/02/19 1045      PT SHORT TERM GOAL #1   Title  pt will perform TUG in <19 sec for reduced risk of falls    Time  6    Period  Weeks    Status  New    Target Date  08/12/19      PT SHORT TERM GOAL #2   Baseline  Pt will be able to peform 6MWT with LRAD  Time  6    Period  Weeks    Status  New    Target Date  08/12/19      PT SHORT TERM GOAL #3   Title  Pt will report 25% less pain overall    Time  6    Period  Weeks    Status  New    Target Date  08/12/19        PT Long Term Goals - 07/02/19 1041      PT LONG TERM GOAL #1   Title  Pt will reduce TUG to <15 sec with LRAD for reduced risk of falls    Time  12    Period  Weeks    Status  New    Target Date  09/23/19      PT LONG TERM GOAL #2   Title  Pt will be able to ambulate with LRAD for at least 15 minutes in order to do grocery shopping    Time  12    Period  Weeks    Status  New    Target Date  09/23/19      PT LONG TERM GOAL #3   Title  Pt will report pain level up to 6/10 at the most during typical ADLs    Time  12    Period  Weeks    Status  New    Target Date  09/23/19      PT LONG TERM GOAL #4   Title  LE and core strength improved in order to perform self care in the morning such as cleaning and dressing without any assisstance    Time  12    Period  Weeks    Status  New    Target Date  09/23/19      PT LONG TERM GOAL #5   Title  Pt will be ind with advanced HEP in order to maintain strength and manage low back pain after discharge from therapy    Time  12    Period  Weeks    Status  New    Target Date  09/23/19            Plan  - 07/18/19 1517    Clinical Impression Statement  Pt presents today for her first aquatic PT session. Pt has a history of exercising in the water with postive effects. Pt verbally understands water principles and how to utilize them. Pt did have some shortness of breath throughout the pool session which she recovered with a few minutes of rest. Pt did not report back pain while performing her standing/walking exercises.    Personal Factors and Comorbidities  Time since onset of injury/illness/exacerbation;Comorbidity 1    Comorbidities  scoliosis, spondylosis    Examination-Activity Limitations  Dressing;Hygiene/Grooming;Stand;Locomotion Level    Stability/Clinical Decision Making  Evolving/Moderate complexity    Rehab Potential  Excellent    PT Frequency  2x / week    PT Duration  12 weeks    PT Treatment/Interventions  ADLs/Self Care Home Management;Aquatic Therapy;Biofeedback;Cryotherapy;Electrical Stimulation;Moist Heat;Traction;Neuromuscular re-education;Therapeutic exercise;Therapeutic activities;Stair training;Gait training;Patient/family education;Manual techniques;Dry needling;Passive range of motion;Taping    PT Next Visit Plan  See how pt tolerated the water exercises. Continue with strength progression.    PT Home Exercise Plan  Access Code: [email or web address]    Consulted and Agree with Plan of Care  Patient       Patient will benefit from skilled therapeutic intervention in order to improve the following deficits  and impairments:  Abnormal gait, Decreased range of motion, Difficulty walking, Increased muscle spasms, Pain, Decreased activity tolerance, Impaired flexibility, Decreased strength, Decreased balance, Postural dysfunction  Visit Diagnosis: Other abnormalities of gait and mobility  Chronic midline low back pain, unspecified whether sciatica present  Muscle weakness (generalized)  Difficulty in walking, not elsewhere classified     Problem List Patient Active Problem List    Diagnosis Date Noted  . Class 2 drug-induced obesity with serious comorbidity and body mass index (BMI) of 36.0 to 36.9 in adult 02/19/2019  . S/P insertion of spinal cord stimulator 01/09/2019  . Spondylosis without myelopathy or radiculopathy, lumbar region 01/09/2019  . 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 Coronary artery disease 10/17/2018  . Mesenteric mass (lipoma) s/p excision 03/20/2018 03/25/2018  . Diverticulitis of sigmoid colon 03/25/2018  . S/P laparoscopic-assisted sigmoidectomy 03/20/2018  . Colonic mass 03/20/2018  . History of adenomatous polyp of colon   . Polyp of ascending colon   . 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 Orthostatic hypotension 09/24/2017  . Visit for monitoring Tikosyn therapy 07/23/2017  . Depression 06/15/2017  . Acute respiratory failure with hypoxia ([place])   . Hypothyroidism   . Bronchitis 03/22/2016  . Abnormal CT of the chest 03/16/2016  . Coronary artery disease involving coronary bypass graft of native heart with angina pectoris ([place]) 03/16/2016  . Chronic anticoagulation 10/06/2015  . On dofetilide therapy 10/06/2015  . Atrial fibrillation ([place]) 09/15/2015  . Snoring 02/17/2015  . Chronic low back pain 11/09/2014  . Disc degeneration, lumbar 11/09/2014  . Addison disease ([place]) 09/30/2014  . Chronic back pain 09/30/2014  . Hematochezia-(Hgb stable) 09/03/2014  . DOE (dyspnea on exertion) 09/01/2014  . Lumbar radiculopathy 04/27/2014  . Osteoarthritis 11/18/2008  . Hyperlipidemia 11/17/2008  . Essential hypertension 11/17/2008    Estes Lehner, PTA 07/18/2019, 3:24 PM  [facility] [address] [place], [place], [address] Phone: [phone]   Fax:  [phone]  Name: [name] MRN: [id number] Date of Birth: Aug 15, 1940

## 2019-07-21 ENCOUNTER — Emergency Department (HOSPITAL_COMMUNITY): Payer: Medicare PPO

## 2019-07-21 ENCOUNTER — Other Ambulatory Visit: Payer: Self-pay

## 2019-07-21 ENCOUNTER — Emergency Department (HOSPITAL_COMMUNITY)
Admission: EM | Admit: 2019-07-21 | Discharge: 2019-07-21 | Disposition: A | Discharge status: Home / Self Care | Payer: Medicare PPO | Attending: Emergency Medicine | Admitting: Emergency Medicine

## 2019-07-21 ENCOUNTER — Ambulatory Visit: Payer: Medicare PPO

## 2019-07-21 DIAGNOSIS — S161XXA Strain of muscle, fascia and tendon at neck level, initial encounter: Secondary | ICD-10-CM

## 2019-07-21 DIAGNOSIS — S20219A Contusion of unspecified front wall of thorax, initial encounter: Secondary | ICD-10-CM

## 2019-07-21 DIAGNOSIS — Z7901 Long term (current) use of anticoagulants: Secondary | ICD-10-CM

## 2019-07-21 DIAGNOSIS — W19XXXA Unspecified fall, initial encounter: Secondary | ICD-10-CM

## 2019-07-21 DIAGNOSIS — R1010 Upper abdominal pain, unspecified: Secondary | ICD-10-CM

## 2019-07-21 DIAGNOSIS — Z933 Colostomy status: Secondary | ICD-10-CM | POA: Insufficient documentation

## 2019-07-21 DIAGNOSIS — R2681 Unsteadiness on feet: Secondary | ICD-10-CM | POA: Diagnosis not present

## 2019-07-21 DIAGNOSIS — W01190A Fall on same level from slipping, tripping and stumbling with subsequent striking against furniture, initial encounter: Secondary | ICD-10-CM | POA: Insufficient documentation

## 2019-07-21 DIAGNOSIS — R2689 Other abnormalities of gait and mobility: Secondary | ICD-10-CM

## 2019-07-21 DIAGNOSIS — Y9389 Activity, other specified: Secondary | ICD-10-CM | POA: Diagnosis not present

## 2019-07-21 DIAGNOSIS — Z79899 Other long term (current) drug therapy: Secondary | ICD-10-CM | POA: Insufficient documentation

## 2019-07-21 DIAGNOSIS — R0789 Other chest pain: Secondary | ICD-10-CM | POA: Insufficient documentation

## 2019-07-21 DIAGNOSIS — Y999 Unspecified external cause status: Secondary | ICD-10-CM | POA: Insufficient documentation

## 2019-07-21 DIAGNOSIS — Z7982 Long term (current) use of aspirin: Secondary | ICD-10-CM | POA: Insufficient documentation

## 2019-07-21 DIAGNOSIS — M6281 Muscle weakness (generalized): Secondary | ICD-10-CM

## 2019-07-21 DIAGNOSIS — Y92009 Unspecified place in unspecified non-institutional (private) residence as the place of occurrence of the external cause: Secondary | ICD-10-CM | POA: Diagnosis not present

## 2019-07-21 DIAGNOSIS — G8929 Other chronic pain: Secondary | ICD-10-CM

## 2019-07-21 DIAGNOSIS — R262 Difficulty in walking, not elsewhere classified: Secondary | ICD-10-CM

## 2019-07-21 DIAGNOSIS — S199XXA Unspecified injury of neck, initial encounter: Secondary | ICD-10-CM | POA: Diagnosis present

## 2019-07-21 LAB — COMPREHENSIVE METABOLIC PANEL
ALT: 32 U/L (ref 0–44)
AST: 37 U/L (ref 15–41)
Albumin: 3.2 g/dL — ABNORMAL LOW (ref 3.5–5.0)
Alkaline Phosphatase: 63 U/L (ref 38–126)
Anion gap: 11 (ref 5–15)
BUN: 15 mg/dL (ref 8–23)
CO2: 26 mmol/L (ref 22–32)
Calcium: 8.7 mg/dL — ABNORMAL LOW (ref 8.9–10.3)
Chloride: 102 mmol/L (ref 98–111)
Creatinine, Ser: 0.84 mg/dL (ref 0.44–1.00)
GFR calc Af Amer: >60 mL/min (ref >60)
GFR calc non Af Amer: >60 mL/min (ref >60)
Glucose, Bld: 145 mg/dL — ABNORMAL HIGH (ref 70–99)
Potassium: 3.7 mmol/L (ref 3.5–5.1)
Sodium: 139 mmol/L (ref 135–145)
Total Bilirubin: 0.5 mg/dL (ref 0.3–1.2)
Total Protein: 5.8 g/dL — ABNORMAL LOW (ref 6.5–8.1)

## 2019-07-21 LAB — CBC
HCT: 37.4 % (ref 36.0–46.0)
Hemoglobin: 12 g/dL (ref 12.0–15.0)
MCH: 30.4 pg (ref 26.0–34.0)
MCHC: 32.1 g/dL (ref 30.0–36.0)
MCV: 94.7 fL (ref 80.0–100.0)
Platelets: 269 10*3/uL (ref 150–400)
RBC: 3.95 MIL/uL (ref 3.87–5.11)
RDW: 12.5 % (ref 11.5–15.5)
WBC: 14.3 10*3/uL — ABNORMAL HIGH (ref 4.0–10.5)
nRBC: 0 % (ref 0.0–0.2)

## 2019-07-21 LAB — I-STAT CHEM 8, ED
BUN: 16 mg/dL (ref 8–23)
Calcium, Ion: 1.13 mmol/L — ABNORMAL LOW (ref 1.15–1.40)
Chloride: 102 mmol/L (ref 98–111)
Creatinine, Ser: 0.7 mg/dL (ref 0.44–1.00)
Glucose, Bld: 139 mg/dL — ABNORMAL HIGH (ref 70–99)
HCT: 39 % (ref 36.0–46.0)
Hemoglobin: 13.3 g/dL (ref 12.0–15.0)
Potassium: 3.5 mmol/L (ref 3.5–5.1)
Sodium: 138 mmol/L (ref 135–145)
TCO2: 27 mmol/L (ref 22–32)

## 2019-07-21 LAB — SAMPLE TO BLOOD BANK

## 2019-07-21 LAB — ETHANOL: Alcohol, Ethyl (B): <10 mg/dL (ref <10)

## 2019-07-21 LAB — LACTIC ACID, PLASMA: Lactic Acid, Venous: 1.9 mmol/L (ref 0.5–1.9)

## 2019-07-21 MED ORDER — FENTANYL CITRATE (PF) 100 MCG/2ML IJ SOLN
50.0000 ug | INTRAMUSCULAR | Status: DC | PRN
  Administered 2019-07-21: 50 ug via INTRAVENOUS
  Filled 2019-07-21: qty 2

## 2019-07-21 MED ORDER — DIPHENHYDRAMINE HCL 50 MG/ML IJ SOLN: 12.5000 mg | Freq: Once | INTRAMUSCULAR | Status: DC

## 2019-07-21 MED ORDER — METAXALONE 800 MG PO TABS: 800.0000 mg | ORAL_TABLET | Freq: Three times a day (TID) | ORAL | 0 refills | Status: AC

## 2019-07-21 MED ORDER — DIPHENHYDRAMINE HCL 50 MG/ML IJ SOLN
12.5000 mg | Freq: Once | INTRAMUSCULAR | Status: AC
  Administered 2019-07-21: 12.5 mg via INTRAVENOUS
  Filled 2019-07-21: qty 1

## 2019-07-21 MED ORDER — MORPHINE SULFATE (PF) 4 MG/ML IV SOLN
4.0000 mg | Freq: Once | INTRAVENOUS | Status: AC
  Administered 2019-07-21: 4 mg via INTRAVENOUS
  Filled 2019-07-21: qty 1

## 2019-07-21 MED ORDER — IOHEXOL 300 MG/ML  SOLN
100.0000 mL | Freq: Once | INTRAMUSCULAR | Status: AC | PRN
  Administered 2019-07-21: 100 mL via INTRAVENOUS

## 2019-07-21 MED ORDER — MORPHINE SULFATE (PF) 4 MG/ML IV SOLN: 4.0000 mg | Freq: Once | INTRAVENOUS | Status: DC

## 2019-07-21 MED ORDER — ONDANSETRON HCL 4 MG/2ML IJ SOLN
4.0000 mg | INTRAMUSCULAR | Status: DC | PRN
  Administered 2019-07-21: 4 mg via INTRAVENOUS
  Filled 2019-07-21: qty 2

## 2019-07-21 NOTE — ED Provider Notes (Signed)
Patient signed to me by Dr. Vallery Ridge pending results of her CT scans.  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  Patient informed of these findings and the importance of follow-up with her doctor.  Continues to note chest wall discomfort and will prescribe analgesics   Lacretia Leigh, MD 07/21/19 1725

## 2019-07-21 NOTE — Progress Notes (Signed)
Orthopedic Tech Progress Note Patient Details:  [name] 08-26-1940 [id number] Level 2 trauma  Patient ID: [name], female   DOB: Sep 17, 1940, 79 y.o.   MRN: [id number]   Janit Pagan 07/21/2019, 2:40 PM

## 2019-07-21 NOTE — Discharge Instructions (Addendum)
The radiologist saw the following findings: 4.4 cm fat and soft tissue mass in the left retroperitoneum,  previously 3.1 cm in 2019. 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   I it is very important that you follow-up with your doctor for this to make sure that this is not cancer.

## 2019-07-21 NOTE — ED Triage Notes (Signed)
Pt bib ems mechanical fall. Pt fell and hit glass table with center chest and L side. Pain worse with inspiration. On eliquis for afib. 131mcg fentanyl given en route.  160/100 HR 70 95% RA

## 2019-07-21 NOTE — ED Provider Notes (Signed)
[facility] EMERGENCY DEPARTMENT Provider Note   CSN: [id number] Arrival date & time: 07/21/19  1421     History Chief Complaint  Patient presents with  . Fall    [name] is a 79 y.o. female.  HPI Patient reports he lost her balance and fell.  She reports she fell chest first into a table in her house.  He reports that she has a lot of pain across the front of her chest and wraps around somewhat on the left into her back.  She denies she struck her head.  She did not have loss of consciousness.  Patient does not have confusion or headache.  She reports some discomfort in the back of her neck.  Has not experienced any numbness or tingling into her arms or her legs.  She does not have pain into her hips or legs.  Patient does take Eliquis regularly.  She reports she does not feel short of breath at rest but taking a deep breath is very painful.    No past medical history on file.  There are no problems to display for this patient.      OB History   No obstetric history on file.     No family history on file.  Social History   Tobacco Use  . Smoking status: Not on file  Substance Use Topics  . Alcohol use: Not on file  . Drug use: Not on file    Home Medications Prior to Admission medications   Medication Sig Start Date End Date Taking? Authorizing Provider  amiodarone (PACERONE) 200 MG tablet Take 200 mg by mouth in the morning. 05/09/19  Yes [provider]  aspirin 81 MG EC tablet Take 81 mg by mouth daily. Swallow whole.   Yes [provider]  diazepam (VALIUM) 5 MG tablet Take 2.5 mg by mouth See admin instructions. Take 2.5 mg by mouth one to two times a day as needed for anxiety 06/27/19  Yes [provider]  diltiazem (CARDIZEM CD) 180 MG 24 hr capsule Take 180 mg by mouth at bedtime. 06/16/19  Yes [provider]  ELIQUIS 5 MG TABS tablet Take 5 mg by mouth 2 (two) times daily. 07/21/19  Yes [provider]  FLUoxetine (PROZAC) 10 MG capsule Take 10 mg by mouth in the morning. 05/09/19  Yes [provider]  furosemide (LASIX) 40 MG tablet Take 40 mg by mouth 2 (two) times daily. 05/20/19  Yes [provider]  hydrocortisone (CORTEF) 20 MG tablet Take 20 mg by mouth in the morning and at bedtime. 03/04/19  Yes [provider]  levothyroxine (SYNTHROID) 88 MCG tablet Take 88 mcg by mouth daily before breakfast. 04/19/19  Yes [provider]  Magnesium Oxide 400 (240 Mg) MG TABS Take 400 mg by mouth in the morning. 06/16/19  Yes [provider]  omeprazole (PRILOSEC) 20 MG capsule Take 20 mg by mouth daily before breakfast. 06/30/19  Yes [provider]  potassium chloride (KLOR-CON) 10 MEQ tablet Take 40 mEq by mouth 2 (two) times daily. 04/11/19  Yes [provider]  REPATHA SURECLICK [id number] MG/ML SOAJ Inject 1 Syringe into the skin every 14 (fourteen) days.  07/21/19  Yes [provider]  rOPINIRole (REQUIP) 0.25 MG tablet Take 0.5 mg by mouth at bedtime. 06/30/19  Yes [provider]  valsartan (DIOVAN) 80 MG tablet Take 80 mg by mouth at bedtime.  05/20/19  Yes [provider]  Vitamin D, Ergocalciferol, (DRISDOL) 1.25 MG (50000 UNIT) CAPS capsule Take 50,000 Units by mouth See admin instructions. Take 50,000 units by mouth 2 times a week, on Tuesdays and Fridays 05/20/19  Yes [provider]    Allergies    Oxycodone, Sulfa antibiotics, and Tramadol  Review of Systems   Review of Systems 10 Systems reviewed and are negative for acute change except as noted in the HPI.  Physical Exam Updated Vital Signs BP (!) 147/54   Pulse 63   Temp 98 F (36.7 C) (Oral)   Resp 17   Ht 5\' 4"  (1.626 m)   Wt 92.5 kg   SpO2 99%   BMI 35.02 kg/m   Physical Exam Constitutional:      Appearance: Normal appearance.     Comments: No respiratory distress at rest.  GCS is 15.  HENT:     Head: Normocephalic  and atraumatic.     Mouth/Throat:     Mouth: Mucous membranes are moist.     Pharynx: Oropharynx is clear.  Eyes:     Extraocular Movements: Extraocular movements intact.     Conjunctiva/sclera: Conjunctivae normal.     Pupils: Pupils are equal, round, and reactive to light.  Neck:     Comments: Patient diffusely endorses some paraspinous discomfort to palpation of the neck.  No bony point tenderness. Cardiovascular:     Rate and Rhythm: Normal rate and regular rhythm.  Pulmonary:     Effort: Pulmonary effort is normal.     Breath sounds: Normal breath sounds.     Comments: Anterior chest wall is very tender over the sternum and the left anterior chest wall.  No crepitus or significant abrasions visible at this time. Chest:     Chest wall: Tenderness present.  Abdominal:     Comments: Is soft.  Patient has moderate to severe pain in the left upper quadrant to palpation.  No visible contusions or abrasions.  Lower abdomen nontender.  Patient has colostomy in lower abdomen.  Musculoskeletal:        General: No swelling, tenderness or deformity. Normal range of motion.     Right lower leg: No edema.     Left lower leg: No edema.     Comments: No deformities of extremities.  No pain with range of motion of the extremities.  Skin:    General: Skin is warm and dry.  Neurological:     General: No focal deficit present.     Mental Status: She is alert and oriented to person, place, and time.     Cranial Nerves: No cranial nerve deficit.     Motor: No weakness.     Coordination: Coordination normal.  Psychiatric:        Mood and Affect: Mood normal.     ED Results / Procedures / Treatments   Labs (all labs ordered are listed, but only abnormal results are displayed) Labs Reviewed  COMPREHENSIVE METABOLIC PANEL  CBC  ETHANOL  URINALYSIS, ROUTINE W REFLEX MICROSCOPIC  LACTIC ACID, PLASMA  PROTIME-INR  I-STAT CHEM 8, ED  SAMPLE TO BLOOD BANK    EKG EKG  Interpretation  Date/Time:  Monday July 21 2019 14:26:48 EDT Ventricular Rate:  67 PR Interval:    QRS Duration: 130 QT Interval:  449 QTC Calculation: 474 R Axis:   83 Text Interpretation: Sinus rhythm Prolonged PR interval Right bundle branch block agree. no old in system Confirmed by Charlesetta Shanks [id number]) on 07/21/2019 3:01:26 PM  Radiology DG Chest Portable 1 View  Result Date: 07/21/2019 CLINICAL DATA:  Status post fall with a blow to the left side of the chest. Initial encounter. EXAM: PORTABLE CHEST 1 VIEW COMPARISON:  Single-view of the chest 09/24/2017. FINDINGS: The lungs are clear. No pneumothorax or pleural effusion. There is cardiomegaly. The patient is status post CABG with atherosclerosis noted. No acute or focal bony abnormality. IMPRESSION: No acute disease. Cardiomegaly. Atherosclerosis. Electronically Signed   By: Inge Rise M.D.   On: 07/21/2019 14:55    Procedures Procedures (including critical care time)  Medications Ordered in ED Medications  ondansetron (ZOFRAN) injection 4 mg (has no administration in time range)  fentaNYL (SUBLIMAZE) injection 50 mcg (has no administration in time range)    ED Course  I have reviewed the triage vital signs and the nursing notes.  Pertinent labs & imaging results that were available during my care of the patient were reviewed by me and considered in my medical decision making (see chart for details).    MDM Rules/Calculators/A&P                      Patient with mechanical fall.  She is anticoagulated on Eliquis.  Patient has severe pain in the anterior chest and upper abdomen.  We will proceed with CT scanning as patient is at increased risk for bleeding.  No respiratory distress, no hypoxia.  Patient stable to proceed with diagnostic imaging.  Fentanyl and Zofran ordered for symptomatic treatment of pain and nausea.  Shift change Dr. Antony Haste to follow-up on imaging and reassess patient for condition. Final  Clinical Impression(s) / ED Diagnoses Final diagnoses:  Fall, initial encounter  Contusion of chest wall, unspecified laterality, initial encounter  Upper abdominal pain  Strain of neck muscle, initial encounter  Anticoagulated by anticoagulation treatment    Rx / DC Orders ED Discharge Orders    None       Charlesetta Shanks, MD 07/21/19 1507

## 2019-07-21 NOTE — ED Notes (Signed)
Pt calling friend for ride home.

## 2019-07-21 NOTE — Therapy (Addendum)
[facility] [address] [place], [place], [address] Phone: [phone]   Fax:  [phone]  Physical Therapy Treatment  Patient Details  Name: [name] MRN: [id number] Date of Birth: 29-Apr-1940 Referring Provider (PT): Crist Infante   Encounter Date: 07/21/2019  PT End of Session - 07/21/19 1114    Visit Number  6    Date for PT Re-Evaluation  09/23/19    Authorization Type  [employer]: 12 visits 3/30-6/22    Authorization - Visit Number  6    Authorization - Number of Visits  12    Progress Note Due on Visit  10    PT Start Time  1016    PT Stop Time  1102    PT Time Calculation (min)  46 min    Activity Tolerance  Patient tolerated treatment well    Behavior During Therapy  [facility] for tasks assessed/performed       Past Medical History:  Diagnosis Date  . Addison's disease ([place])   . Anxiety   . Arthritis    "everywhere"  . Atrial fibrillation (HCC)    Eliquis  . B12 deficiency   . Back pain   . CHF (congestive heart failure) ([place])   . Chronic bronchitis ([place])    "although I didn't get it this year" (07/23/2017)  . Colostomy in place [facility])   . 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  last knee surgery dr. Elmyra Ricks gave large dose of hydrocortisal and avoided the hypotensive side effectas of addison's disease.  . Coronary artery disease   . Depression   . GERD (gastroesophageal reflux disease)   . Hyperlipemia   . Hypertension   . Hypothyroidism   . Lumbar radiculopathy 04/27/2014  . Migraine    "in my 20's" (07/23/2017)  . Osteoarthritis   . Palpitations   . Recurrent upper respiratory infection (URI)   . Sleep apnea    uses a cpap   . SOB (shortness of breath)   . UTI (lower urinary tract infection) 07/09/2011  . Vitamin D deficiency     Past Surgical History:  Procedure Laterality Date  . ACHILLES TENDON SURGERY Left 07/16/2014   bone spur  removed  . ANTERIOR LUMBAR FUSION  1980   L4-5  . BACK SURGERY    . CARDIAC CATHETERIZATION  2013  . CARDIAC CATHETERIZATION N/A 09/02/2014   Procedure: Left Heart Cath and Coronary Angiography;  Surgeon: Wellington Hampshire, MD;  Location: [place] CV LAB;  Service: Cardiovascular;  Laterality: N/A;  . CARDIOVERSION N/A 10/22/2015   Procedure: CARDIOVERSION;  Surgeon: Sanda Klein, MD;  Location: [place] ENDOSCOPY;  Service: Cardiovascular;  Laterality: N/A;  . CARDIOVERSION N/A 06/07/2017   Procedure: CARDIOVERSION;  Surgeon: Larey Dresser, MD;  Location: [facility] ENDOSCOPY;  Service: Cardiovascular;  Laterality: N/A;  . CARDIOVERSION N/A 07/05/2017   Procedure: CARDIOVERSION;  Surgeon: Pixie Casino, MD;  Location: [facility] ENDOSCOPY;  Service: Cardiovascular;  Laterality: N/A;  . CARDIOVERSION N/A 09/30/2018   Procedure: CARDIOVERSION;  Surgeon: Buford Dresser, MD;  Location: [facility] ENDOSCOPY;  Service: Cardiovascular;  Laterality: N/A;  . CARDIOVERSION N/A 11/26/2018   Procedure: CARDIOVERSION;  Surgeon: Sanda Klein, MD;  Location: [facility] ENDOSCOPY;  Service: Cardiovascular;  Laterality: N/A;  . CARPAL TUNNEL RELEASE Right   . CATARACT EXTRACTION, BILATERAL Bilateral   . COLON RESECTION N/A 03/20/2018   Procedure: LAPAROSCOPIC SIGMOIDECTOMY WITH COLOSTOMY;  Surgeon: Ileana Roup, MD;  Location: [facility] ORS;  Service: General;  Laterality: N/A;  . COLON SURGERY     laparascopic vs. open sigmoidectomy with cystoscopy  Dr. Dema Severin and Dr. Gloriann Loan 03-20-18  . COLONOSCOPY WITH PROPOFOL N/A 01/17/2018   Procedure: COLONOSCOPY WITH PROPOFOL;  Surgeon: Milus Banister, MD;  Location: [facility] ENDOSCOPY;  Service: Endoscopy;  Laterality: N/A;  . CORONARY ARTERY BYPASS GRAFT  06/26/2011   Procedure: CORONARY ARTERY BYPASS GRAFTING (CABG);  Surgeon: Melrose Nakayama, MD;  Location: [place];  Service: Open Heart Surgery;  Laterality: N/A;  times using Greater Saphenous Vein Graft harvested endoscopically from right  leg  . CYSTOSCOPY W/ URETERAL STENT PLACEMENT Bilateral 03/20/2018   Procedure: CYSTOSCOPY WITH BILATERAL RETROGRADE AND BILATERAL URETERAL CATHETER PLACEMENT;  Surgeon: Lucas Mallow, MD;  Location: [facility];  Service: Urology;  Laterality: Bilateral;  . DIAGNOSTIC LAPAROSCOPY    . DILATION AND CURETTAGE OF UTERUS    . JOINT REPLACEMENT    . KNEE ARTHROSCOPY Bilateral   . LAPAROSCOPIC CHOLECYSTECTOMY    . POLYPECTOMY  01/17/2018   Procedure: POLYPECTOMY;  Surgeon: Milus Banister, MD;  Location: [facility];  Service: Endoscopy;;  . REVISION TOTAL KNEE ARTHROPLASTY Right   . SPINAL CORD STIMULATOR IMPLANT  12/10/15  . SPINAL CORD STIMULATOR REMOVAL N/A 04/10/2019   Procedure: Spinal cord stimulator removal, thoracic paddle and generator;  Surgeon: Kristeen Miss, MD;  Location: [place];  Service: Neurosurgery;  Laterality: N/A;  Spinal cord stimulator removal, thoracic paddle and generator  . TAKE DOWN OF INTESTINAL FISTULA N/A 03/20/2018   Procedure: LAPAROSCOPIC TAKE DOWN OF COLOVESICAL FISTULA;  Surgeon: Ileana Roup, MD;  Location: [facility];  Service: General;  Laterality: N/A;  . TONSILLECTOMY    . TOTAL KNEE ARTHROPLASTY Bilateral     There were no vitals filed for this visit.  Subjective Assessment - 07/21/19 1021    Subjective  I really liked the pool.  I wish I could do that everyday.    Currently in Pain?  Yes    Pain Score  8     Pain Location  Back    Pain Orientation  Mid;Lower    Pain Descriptors / Indicators  Sharp;Constant    Pain Type  Chronic pain    Pain Onset  More than a month ago    Pain Frequency  Intermittent    Aggravating Factors   It just hurts, standing    Pain Relieving Factors  in the pool, heating pad         [facility] PT Assessment - 07/21/19 0001      Standardized Balance Assessment   Standardized Balance Assessment  Timed Up and Go Test      Timed Up and Go Test   Normal TUG (seconds)  13.88    TUG Comments  no walker                    [facility] Adult PT Treatment/Exercise - 07/21/19 0001      Neuro Re-ed    Neuro Re-ed Details   core engaged with all exercises - moist heat lumbar while sitting thoughout treatment      Lumbar Exercises: Aerobic   Nustep  L3 x 10 min PT present for status update      Lumbar Exercises: Standing   Other Standing Lumbar Exercises  standing on balance pad 2x30 seconds, medial/lateral weight shifting x1 minute      Lumbar Exercises: Seated   Long Arc Quad on  Chair  Strengthening;Both;2 sets;10 reps;Weights    LAQ on Chair Weights (lbs)  2    Hip Flexion on Ball  Strengthening;Both;20 reps    Hip Flexion on Ball Limitations  2# in chair      Manual Therapy   Manual Therapy  Soft tissue mobilization    Manual therapy comments  bil thoracic paraspinals, lumbar paraspinals                PT Short Term Goals - 07/21/19 1034      PT SHORT TERM GOAL #1   Title  pt will perform TUG in <19 sec for reduced risk of falls    Baseline  13.88 without walker    Status  Achieved    Target Date  08/12/19        PT Long Term Goals - 07/02/19 1041      PT LONG TERM GOAL #1   Title  Pt will reduce TUG to <15 sec with LRAD for reduced risk of falls    Time  12    Period  Weeks    Status  New    Target Date  09/23/19      PT LONG TERM GOAL #2   Title  Pt will be able to ambulate with LRAD for at least 15 minutes in order to do grocery shopping    Time  12    Period  Weeks    Status  New    Target Date  09/23/19      PT LONG TERM GOAL #3   Title  Pt will report pain level up to 6/10 at the most during typical ADLs    Time  12    Period  Weeks    Status  New    Target Date  09/23/19      PT LONG TERM GOAL #4   Title  LE and core strength improved in order to perform self care in the morning such as cleaning and dressing without any assisstance    Time  12    Period  Weeks    Status  New    Target Date  09/23/19      PT LONG TERM GOAL #5   Title   Pt will be ind with advanced HEP in order to maintain strength and manage low back pain after discharge from therapy    Time  12    Period  Weeks    Status  New    Target Date  09/23/19            Plan - 07/21/19 1042    Clinical Impression Statement  Pt responded well to pool therapy last visit.  Pt denies any other changes in pain since the start of care.  Pt reports increased ease with walking since the start of care.  TUG time is improved today from 25 seconds to 13.88 seconds without the walker indicating improved balance overall.  Pt with tension and trigger points in bil thoracic and lumbar paraspinals and demonstrated improved tissue mobility after manual therapy today.  Pt will have a pool session next.    PT Frequency  2x / week    PT Duration  12 weeks    PT Treatment/Interventions  ADLs/Self Care Home Management;Aquatic Therapy;Biofeedback;Cryotherapy;Electrical Stimulation;Moist Heat;Traction;Neuromuscular re-education;Therapeutic exercise;Therapeutic activities;Stair training;Gait training;Patient/family education;Manual techniques;Dry needling;Passive range of motion;Taping    PT Next Visit Plan  strength progression, manual to address pain, aquatic therapy next session    PT  Home Exercise Plan  Access Code: [facility]    Recommended Other Services  cert is signed    Consulted and Agree with Plan of Care  Patient       Patient will benefit from skilled therapeutic intervention in order to improve the following deficits and impairments:  Abnormal gait, Decreased range of motion, Difficulty walking, Increased muscle spasms, Pain, Decreased activity tolerance, Impaired flexibility, Decreased strength, Decreased balance, Postural dysfunction  Visit Diagnosis: Chronic midline low back pain, unspecified whether sciatica present  Other abnormalities of gait and mobility  Muscle weakness (generalized)  Difficulty in walking, not elsewhere classified     Problem  List Patient Active Problem List   Diagnosis Date Noted  . Class 2 drug-induced obesity with serious comorbidity and body mass index (BMI) of 36.0 to 36.9 in adult 02/19/2019  . S/P insertion of spinal cord stimulator 01/09/2019  . Spondylosis without myelopathy or radiculopathy, lumbar region 01/09/2019  . 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 Coronary artery disease 10/17/2018  . Mesenteric mass (lipoma) s/p excision 03/20/2018 03/25/2018  . Diverticulitis of sigmoid colon 03/25/2018  . S/P laparoscopic-assisted sigmoidectomy 03/20/2018  . Colonic mass 03/20/2018  . History of adenomatous polyp of colon   . Polyp of ascending colon   . 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 Orthostatic hypotension 09/24/2017  . Visit for monitoring Tikosyn therapy 07/23/2017  . Depression 06/15/2017  . Acute respiratory failure with hypoxia ([place])   . Hypothyroidism   . Bronchitis 03/22/2016  . Abnormal CT of the chest 03/16/2016  . Coronary artery disease involving coronary bypass graft of native heart with angina pectoris ([place]) 03/16/2016  . Chronic anticoagulation 10/06/2015  . On dofetilide therapy 10/06/2015  . Atrial fibrillation ([place]) 09/15/2015  . Snoring 02/17/2015  . Chronic low back pain 11/09/2014  . Disc degeneration, lumbar 11/09/2014  . Addison disease ([place]) 09/30/2014  . Chronic back pain 09/30/2014  . Hematochezia-(Hgb stable) 09/03/2014  . DOE (dyspnea on exertion) 09/01/2014  . Lumbar radiculopathy 04/27/2014  . Osteoarthritis 11/18/2008  . Hyperlipidemia 11/17/2008  . Essential hypertension 11/17/2008    Sigurd Sos, PT 07/21/19 11:16 AM PHYSICAL THERAPY DISCHARGE SUMMARY  Visits from Start of Care: 6  Current functional level related to goals / functional outcomes: Pt had a fall and fractured ribs.  Pt will need to go to a rehab facility and will get a new order when return to PT is appropriate.    Remaining  deficits: Pt had a recent fall.  See above for current status prior to the fall.    Education / Equipment: HEP Plan: Patient agrees to discharge.  Patient goals were not met. Patient is being discharged due to a change in medical status.  ?????        Sigurd Sos, PT 07/30/19 7:52 AM   [facility] [address], [place], [place], [address] Phone: [phone]   Fax:  [phone]  Name: BELINA MANDILE MRN: [id number] Date of Birth: 03/05/1941

## 2019-07-21 NOTE — ED Notes (Signed)
Patient Alert and oriented to baseline. Stable and ambulatory to baseline. Patient verbalized understanding of the discharge instructions.  Patient belongings were taken by the patient.   

## 2019-07-23 ENCOUNTER — Emergency Department (HOSPITAL_COMMUNITY): Payer: Medicare PPO

## 2019-07-23 ENCOUNTER — Encounter (HOSPITAL_COMMUNITY): Payer: Self-pay | Admitting: Emergency Medicine

## 2019-07-23 ENCOUNTER — Other Ambulatory Visit: Payer: Self-pay

## 2019-07-23 ENCOUNTER — Observation Stay (HOSPITAL_COMMUNITY)
Admission: EM | Admit: 2019-07-23 | Discharge: 2019-07-29 | Disposition: A | Discharge status: Skilled Nursing Facility | Payer: Medicare PPO | Attending: Internal Medicine | Admitting: Internal Medicine

## 2019-07-23 ENCOUNTER — Inpatient Hospital Stay (HOSPITAL_COMMUNITY): Payer: Medicare PPO

## 2019-07-23 DIAGNOSIS — E661 Drug-induced obesity: Secondary | ICD-10-CM | POA: Diagnosis not present

## 2019-07-23 DIAGNOSIS — Z9049 Acquired absence of other specified parts of digestive tract: Secondary | ICD-10-CM | POA: Insufficient documentation

## 2019-07-23 DIAGNOSIS — K219 Gastro-esophageal reflux disease without esophagitis: Secondary | ICD-10-CM | POA: Diagnosis not present

## 2019-07-23 DIAGNOSIS — J984 Other disorders of lung: Secondary | ICD-10-CM | POA: Diagnosis not present

## 2019-07-23 DIAGNOSIS — S5011XA Contusion of right forearm, initial encounter: Secondary | ICD-10-CM | POA: Insufficient documentation

## 2019-07-23 DIAGNOSIS — Z79899 Other long term (current) drug therapy: Secondary | ICD-10-CM | POA: Insufficient documentation

## 2019-07-23 DIAGNOSIS — Z7901 Long term (current) use of anticoagulants: Secondary | ICD-10-CM | POA: Insufficient documentation

## 2019-07-23 DIAGNOSIS — R0902 Hypoxemia: Secondary | ICD-10-CM | POA: Diagnosis present

## 2019-07-23 DIAGNOSIS — I2510 Atherosclerotic heart disease of native coronary artery without angina pectoris: Secondary | ICD-10-CM | POA: Diagnosis not present

## 2019-07-23 DIAGNOSIS — M6281 Muscle weakness (generalized): Secondary | ICD-10-CM | POA: Insufficient documentation

## 2019-07-23 DIAGNOSIS — F329 Major depressive disorder, single episode, unspecified: Secondary | ICD-10-CM | POA: Diagnosis not present

## 2019-07-23 DIAGNOSIS — S2241XA Multiple fractures of ribs, right side, initial encounter for closed fracture: Principal | ICD-10-CM

## 2019-07-23 DIAGNOSIS — W1830XA Fall on same level, unspecified, initial encounter: Secondary | ICD-10-CM

## 2019-07-23 DIAGNOSIS — I4819 Other persistent atrial fibrillation: Secondary | ICD-10-CM | POA: Insufficient documentation

## 2019-07-23 DIAGNOSIS — Z951 Presence of aortocoronary bypass graft: Secondary | ICD-10-CM | POA: Insufficient documentation

## 2019-07-23 DIAGNOSIS — I509 Heart failure, unspecified: Secondary | ICD-10-CM | POA: Diagnosis not present

## 2019-07-23 DIAGNOSIS — Z7982 Long term (current) use of aspirin: Secondary | ICD-10-CM | POA: Diagnosis not present

## 2019-07-23 DIAGNOSIS — G4733 Obstructive sleep apnea (adult) (pediatric): Secondary | ICD-10-CM | POA: Insufficient documentation

## 2019-07-23 DIAGNOSIS — N179 Acute kidney failure, unspecified: Secondary | ICD-10-CM | POA: Diagnosis not present

## 2019-07-23 DIAGNOSIS — E039 Hypothyroidism, unspecified: Secondary | ICD-10-CM | POA: Diagnosis not present

## 2019-07-23 DIAGNOSIS — Z885 Allergy status to narcotic agent status: Secondary | ICD-10-CM | POA: Insufficient documentation

## 2019-07-23 DIAGNOSIS — J90 Pleural effusion, not elsewhere classified: Secondary | ICD-10-CM | POA: Insufficient documentation

## 2019-07-23 DIAGNOSIS — R0600 Dyspnea, unspecified: Secondary | ICD-10-CM | POA: Insufficient documentation

## 2019-07-23 DIAGNOSIS — I6782 Cerebral ischemia: Secondary | ICD-10-CM | POA: Insufficient documentation

## 2019-07-23 DIAGNOSIS — Z20822 Contact with and (suspected) exposure to covid-19: Secondary | ICD-10-CM | POA: Diagnosis not present

## 2019-07-23 DIAGNOSIS — Z6841 Body Mass Index (BMI) 40.0 and over, adult: Secondary | ICD-10-CM | POA: Insufficient documentation

## 2019-07-23 DIAGNOSIS — M1990 Unspecified osteoarthritis, unspecified site: Secondary | ICD-10-CM | POA: Insufficient documentation

## 2019-07-23 DIAGNOSIS — Z882 Allergy status to sulfonamides status: Secondary | ICD-10-CM | POA: Insufficient documentation

## 2019-07-23 DIAGNOSIS — S2239XA Fracture of one rib, unspecified side, initial encounter for closed fracture: Secondary | ICD-10-CM | POA: Diagnosis present

## 2019-07-23 DIAGNOSIS — Z933 Colostomy status: Secondary | ICD-10-CM | POA: Insufficient documentation

## 2019-07-23 DIAGNOSIS — G8929 Other chronic pain: Secondary | ICD-10-CM | POA: Diagnosis not present

## 2019-07-23 DIAGNOSIS — R2689 Other abnormalities of gait and mobility: Secondary | ICD-10-CM | POA: Insufficient documentation

## 2019-07-23 DIAGNOSIS — F419 Anxiety disorder, unspecified: Secondary | ICD-10-CM | POA: Insufficient documentation

## 2019-07-23 DIAGNOSIS — R339 Retention of urine, unspecified: Secondary | ICD-10-CM | POA: Insufficient documentation

## 2019-07-23 DIAGNOSIS — E271 Primary adrenocortical insufficiency: Secondary | ICD-10-CM | POA: Diagnosis not present

## 2019-07-23 DIAGNOSIS — J42 Unspecified chronic bronchitis: Secondary | ICD-10-CM | POA: Insufficient documentation

## 2019-07-23 DIAGNOSIS — I110 Hypertensive heart disease with heart failure: Secondary | ICD-10-CM | POA: Diagnosis not present

## 2019-07-23 DIAGNOSIS — Z888 Allergy status to other drugs, medicaments and biological substances status: Secondary | ICD-10-CM | POA: Insufficient documentation

## 2019-07-23 LAB — BASIC METABOLIC PANEL
Anion gap: 9 (ref 5–15)
BUN: 18 mg/dL (ref 8–23)
CO2: 27 mmol/L (ref 22–32)
Calcium: 8.5 mg/dL — ABNORMAL LOW (ref 8.9–10.3)
Chloride: 102 mmol/L (ref 98–111)
Creatinine, Ser: 1.1 mg/dL — ABNORMAL HIGH (ref 0.44–1.00)
GFR calc Af Amer: 55 mL/min — ABNORMAL LOW (ref >60)
GFR calc non Af Amer: 47 mL/min — ABNORMAL LOW (ref >60)
Glucose, Bld: 163 mg/dL — ABNORMAL HIGH (ref 70–99)
Potassium: 4.5 mmol/L (ref 3.5–5.1)
Sodium: 138 mmol/L (ref 135–145)

## 2019-07-23 LAB — CBC
HCT: 36.1 % (ref 36.0–46.0)
Hemoglobin: 11.1 g/dL — ABNORMAL LOW (ref 12.0–15.0)
MCH: 29.9 pg (ref 26.0–34.0)
MCHC: 30.7 g/dL (ref 30.0–36.0)
MCV: 97.3 fL (ref 80.0–100.0)
Platelets: 269 10*3/uL (ref 150–400)
RBC: 3.71 MIL/uL — ABNORMAL LOW (ref 3.87–5.11)
RDW: 12.9 % (ref 11.5–15.5)
WBC: 11.4 10*3/uL — ABNORMAL HIGH (ref 4.0–10.5)
nRBC: 0 % (ref 0.0–0.2)

## 2019-07-23 LAB — SARS CORONAVIRUS 2 (TAT 6-24 HRS): SARS Coronavirus 2: NEGATIVE

## 2019-07-23 LAB — TROPONIN I (HIGH SENSITIVITY)
Troponin I (High Sensitivity): 10 ng/L (ref <18)
Troponin I (High Sensitivity): 9 ng/L (ref <18)

## 2019-07-23 MED ORDER — LEVOTHYROXINE SODIUM 88 MCG PO TABS
88.0000 ug | ORAL_TABLET | Freq: Every day | ORAL | Status: DC
  Administered 2019-07-24 – 2019-07-29 (×6): 88 ug via ORAL
  Filled 2019-07-23 (×6): qty 1

## 2019-07-23 MED ORDER — SODIUM CHLORIDE 0.9% FLUSH
3.0000 mL | Freq: Once | INTRAVENOUS | Status: AC
  Administered 2019-07-23: 3 mL via INTRAVENOUS

## 2019-07-23 MED ORDER — SENNOSIDES-DOCUSATE SODIUM 8.6-50 MG PO TABS: 1.0000 | ORAL_TABLET | Freq: Every evening | ORAL | Status: DC | PRN

## 2019-07-23 MED ORDER — MIRABEGRON ER 25 MG PO TB24
50.0000 mg | ORAL_TABLET | Freq: Every day | ORAL | Status: DC
  Administered 2019-07-24 – 2019-07-29 (×6): 50 mg via ORAL
  Filled 2019-07-23 (×6): qty 2

## 2019-07-23 MED ORDER — ROPINIROLE HCL 0.5 MG PO TABS
0.5000 mg | ORAL_TABLET | Freq: Every day | ORAL | Status: DC
  Administered 2019-07-23 – 2019-07-28 (×6): 0.5 mg via ORAL
  Filled 2019-07-23 (×7): qty 1

## 2019-07-23 MED ORDER — APIXABAN 5 MG PO TABS
5.0000 mg | ORAL_TABLET | Freq: Two times a day (BID) | ORAL | Status: DC
  Administered 2019-07-23 – 2019-07-29 (×12): 5 mg via ORAL
  Filled 2019-07-23 (×13): qty 1

## 2019-07-23 MED ORDER — CYANOCOBALAMIN 1000 MCG/ML IJ KIT: 1000.0000 ug | PACK | INTRAMUSCULAR | Status: DC

## 2019-07-23 MED ORDER — MORPHINE SULFATE (PF) 2 MG/ML IV SOLN
2.0000 mg | INTRAVENOUS | Status: DC | PRN
  Administered 2019-07-23 – 2019-07-25 (×2): 4 mg via INTRAVENOUS
  Filled 2019-07-23 (×2): qty 2
  Filled 2019-07-23: qty 1
  Filled 2019-07-23: qty 2

## 2019-07-23 MED ORDER — ENOXAPARIN SODIUM 40 MG/0.4ML ~~LOC~~ SOLN
40.0000 mg | SUBCUTANEOUS | Status: DC
  Administered 2019-07-23: 40 mg via SUBCUTANEOUS
  Filled 2019-07-23: qty 0.4

## 2019-07-23 MED ORDER — ASPIRIN EC 81 MG PO TBEC
81.0000 mg | DELAYED_RELEASE_TABLET | ORAL | Status: DC
  Administered 2019-07-23 – 2019-07-28 (×3): 81 mg via ORAL
  Filled 2019-07-23 (×4): qty 1

## 2019-07-23 MED ORDER — METHOCARBAMOL 500 MG PO TABS
500.0000 mg | ORAL_TABLET | Freq: Three times a day (TID) | ORAL | Status: DC
  Administered 2019-07-23 – 2019-07-27 (×13): 500 mg via ORAL
  Filled 2019-07-23 (×14): qty 1

## 2019-07-23 MED ORDER — FUROSEMIDE 40 MG PO TABS
40.0000 mg | ORAL_TABLET | Freq: Two times a day (BID) | ORAL | Status: DC
  Administered 2019-07-23 – 2019-07-24 (×2): 40 mg via ORAL
  Filled 2019-07-23 (×4): qty 1

## 2019-07-23 MED ORDER — FLUOXETINE HCL 10 MG PO CAPS
10.0000 mg | ORAL_CAPSULE | Freq: Every morning | ORAL | Status: DC
  Administered 2019-07-24 – 2019-07-29 (×5): 10 mg via ORAL
  Filled 2019-07-23 (×8): qty 1

## 2019-07-23 MED ORDER — IRBESARTAN 75 MG PO TABS
75.0000 mg | ORAL_TABLET | Freq: Every day | ORAL | Status: DC
  Administered 2019-07-25 – 2019-07-29 (×5): 75 mg via ORAL
  Filled 2019-07-23 (×5): qty 1

## 2019-07-23 MED ORDER — IPRATROPIUM-ALBUTEROL 0.5-2.5 (3) MG/3ML IN SOLN
3.0000 mL | Freq: Two times a day (BID) | RESPIRATORY_TRACT | Status: DC
  Administered 2019-07-24 (×2): 3 mL via RESPIRATORY_TRACT
  Filled 2019-07-23 (×2): qty 3

## 2019-07-23 MED ORDER — HYDROCORTISONE 20 MG PO TABS
20.0000 mg | ORAL_TABLET | Freq: Two times a day (BID) | ORAL | Status: DC
  Administered 2019-07-24 – 2019-07-29 (×11): 20 mg via ORAL
  Filled 2019-07-23 (×14): qty 1

## 2019-07-23 MED ORDER — IPRATROPIUM-ALBUTEROL 0.5-2.5 (3) MG/3ML IN SOLN
3.0000 mL | Freq: Two times a day (BID) | RESPIRATORY_TRACT | Status: DC
  Administered 2019-07-23: 3 mL via RESPIRATORY_TRACT
  Filled 2019-07-23 (×2): qty 3

## 2019-07-23 MED ORDER — DILTIAZEM HCL ER COATED BEADS 180 MG PO CP24
180.0000 mg | ORAL_CAPSULE | Freq: Every day | ORAL | Status: DC
  Administered 2019-07-23 – 2019-07-29 (×6): 180 mg via ORAL
  Filled 2019-07-23 (×6): qty 1

## 2019-07-23 MED ORDER — PANTOPRAZOLE SODIUM 40 MG PO TBEC
40.0000 mg | DELAYED_RELEASE_TABLET | Freq: Every day | ORAL | Status: DC
  Administered 2019-07-24 – 2019-07-29 (×6): 40 mg via ORAL
  Filled 2019-07-23 (×6): qty 1

## 2019-07-23 MED ORDER — ACETAMINOPHEN 325 MG PO TABS
650.0000 mg | ORAL_TABLET | Freq: Four times a day (QID) | ORAL | Status: DC
  Administered 2019-07-23 – 2019-07-26 (×7): 650 mg via ORAL
  Filled 2019-07-23 (×7): qty 2

## 2019-07-23 MED ORDER — ONDANSETRON HCL 4 MG/2ML IJ SOLN
4.0000 mg | Freq: Once | INTRAMUSCULAR | Status: AC
  Administered 2019-07-23: 4 mg via INTRAVENOUS
  Filled 2019-07-23: qty 2

## 2019-07-23 MED ORDER — LIDOCAINE 5 % EX PTCH
1.0000 | MEDICATED_PATCH | CUTANEOUS | Status: DC
  Administered 2019-07-23 – 2019-07-28 (×6): 1 via TRANSDERMAL
  Filled 2019-07-23 (×7): qty 1

## 2019-07-23 MED ORDER — PROMETHAZINE HCL 25 MG PO TABS
12.5000 mg | ORAL_TABLET | Freq: Four times a day (QID) | ORAL | Status: DC | PRN
  Administered 2019-07-26: 12.5 mg via ORAL
  Filled 2019-07-23: qty 1

## 2019-07-23 MED ORDER — NITROGLYCERIN 0.4 MG SL SUBL: 0.4000 mg | SUBLINGUAL_TABLET | SUBLINGUAL | Status: DC | PRN

## 2019-07-23 MED ORDER — NEBIVOLOL HCL 2.5 MG PO TABS: 2.5000 mg | ORAL_TABLET | Freq: Every day | ORAL | Status: DC

## 2019-07-23 MED ORDER — MORPHINE SULFATE (PF) 4 MG/ML IV SOLN
4.0000 mg | Freq: Once | INTRAVENOUS | Status: AC
  Administered 2019-07-23: 4 mg via INTRAVENOUS
  Filled 2019-07-23: qty 1

## 2019-07-23 MED ORDER — AMIODARONE HCL 200 MG PO TABS
200.0000 mg | ORAL_TABLET | Freq: Every day | ORAL | Status: DC
  Administered 2019-07-25 – 2019-07-29 (×5): 200 mg via ORAL
  Filled 2019-07-23 (×5): qty 1

## 2019-07-23 MED ORDER — HYDROCODONE-ACETAMINOPHEN 5-325 MG PO TABS
1.0000 | ORAL_TABLET | Freq: Four times a day (QID) | ORAL | Status: DC | PRN
  Administered 2019-07-23 – 2019-07-26 (×9): 1 via ORAL
  Filled 2019-07-23 (×9): qty 1

## 2019-07-23 NOTE — ED Notes (Signed)
Patient stated her back was hurting, patient repositioned onto side at this time.

## 2019-07-23 NOTE — ED Notes (Signed)
Will need repeat per MD once pts tremors are less or when pt is laying down

## 2019-07-23 NOTE — Consult Note (Signed)
[facility] Surgery Consult Note  [name] 1941-01-12  [id number].    Requesting MD: Nanda Quinton Chief Complaint/Reason for Consult: right rib fractures s/p fall 2 days ago  HPI:  Patient is an 79 year old female who presented to [facility] after fall 2 days ago with worsening pain over right ribs and shortness of breath. She fell onto a glass top table after losing her balance and landed on anterior right chest and right forearm. She was evaluated at time of fall initially and workup was negative so she was discharged home. Patient lives in a single family home alone, she reports her only child lives in [place], [place]. On evaluation in the ED today she is now found to have right rib fractures 4-6 anteriorly, and 7-8 laterally. Rib fractures are non-displaced and there is no pneumothorax or hemothorax, mild atelectasis present. Patient also reports right forearm hematoma but ROM intact in right elbow and right wrist. She also reports some decreased urination over the last 2 days, but also reports decreased PO intake. She reports 2 other past falls where she also lost her balance. She reports that her stoma has been functioning. She does report some intermittent nausea but also reports this is chronic. She also reports some congestion in the mornings that is chronic as well.   PMH significant for Atrial fibrillation on eliquis, CHF, CAD, HTN, HLD, Addison's disease, Chronic bronchitis, GERD, Hypothyroidism, Migraines, Osteoarthritis, Depression/Anxiety, Chronic back pain - s/p recent removal of spinal stimulator, Colostomy in place. Patient is not on any current narcotic pain medications and reports that she does not tolerate gabapentin or percocet well, both cause her to hallucinate. Similar reactions listed to oxycodone and tramadol. Denies alcohol, tobacco or illicit drug use.   ROS: Review of Systems  Constitutional: Negative for chills and fever.  HENT: Positive for congestion (chronic  morning congestion ).   Eyes: Negative for blurred vision and double vision.  Respiratory: Positive for shortness of breath. Negative for cough, hemoptysis and wheezing.   Cardiovascular: Negative for chest pain, palpitations and leg swelling.  Gastrointestinal: Positive for nausea (chronic - sometimes). Negative for abdominal pain, constipation and vomiting.  Genitourinary: Negative for dysuria, flank pain and hematuria.       Decreased urine output  Musculoskeletal: Positive for back pain (chronic) and falls (2 other past falls).       Pain over right ribs  Neurological: Negative for tingling, sensory change, loss of consciousness and headaches.  All other systems reviewed and are negative.   Family History  Problem Relation Age of Onset  . Heart attack Father   . Heart disease Father   . Hypertension Mother   . Stroke Mother   . Hypertension Maternal Grandfather   . Colon cancer Neg Hx     Past Medical History:  Diagnosis Date  . Addison's disease ([place])   . Anxiety   . Arthritis    "everywhere"  . Atrial fibrillation (HCC)    Eliquis  . B12 deficiency   . Back pain   . CHF (congestive heart failure) ([place])   . Chronic bronchitis ([place])    "although I didn't get it this year" (07/23/2017)  . Colostomy in place [facility]   . 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  last knee surgery dr. Elmyra Ricks gave large dose of hydrocortisal and avoided the hypotensive side effectas of addison's disease.  . Coronary artery disease   . Depression   .  GERD (gastroesophageal reflux disease)   . Hyperlipemia   . Hypertension   . Hypothyroidism   . Lumbar radiculopathy 04/27/2014  . Migraine    "in my 20's" (07/23/2017)  . Osteoarthritis   . Palpitations   . Recurrent upper respiratory infection (URI)   . Sleep apnea    uses a cpap   . SOB (shortness of breath)   . UTI (lower urinary tract infection) 07/09/2011  . Vitamin D deficiency      Past Surgical History:  Procedure Laterality Date  . ACHILLES TENDON SURGERY Left 07/16/2014   bone spur removed  . ANTERIOR LUMBAR FUSION  1980   L4-5  . BACK SURGERY    . CARDIAC CATHETERIZATION  2013  . CARDIAC CATHETERIZATION N/A 09/02/2014   Procedure: Left Heart Cath and Coronary Angiography;  Surgeon: Wellington Hampshire, MD;  Location: [place] CV LAB;  Service: Cardiovascular;  Laterality: N/A;  . CARDIOVERSION N/A 10/22/2015   Procedure: CARDIOVERSION;  Surgeon: Sanda Klein, MD;  Location: [place] ENDOSCOPY;  Service: Cardiovascular;  Laterality: N/A;  . CARDIOVERSION N/A 06/07/2017   Procedure: CARDIOVERSION;  Surgeon: Larey Dresser, MD;  Location: [facility] ENDOSCOPY;  Service: Cardiovascular;  Laterality: N/A;  . CARDIOVERSION N/A 07/05/2017   Procedure: CARDIOVERSION;  Surgeon: Pixie Casino, MD;  Location: [facility] ENDOSCOPY;  Service: Cardiovascular;  Laterality: N/A;  . CARDIOVERSION N/A 09/30/2018   Procedure: CARDIOVERSION;  Surgeon: Buford Dresser, MD;  Location: [facility] ENDOSCOPY;  Service: Cardiovascular;  Laterality: N/A;  . CARDIOVERSION N/A 11/26/2018   Procedure: CARDIOVERSION;  Surgeon: Sanda Klein, MD;  Location: [facility] ENDOSCOPY;  Service: Cardiovascular;  Laterality: N/A;  . CARPAL TUNNEL RELEASE Right   . CATARACT EXTRACTION, BILATERAL Bilateral   . COLON RESECTION N/A 03/20/2018   Procedure: LAPAROSCOPIC SIGMOIDECTOMY WITH COLOSTOMY;  Surgeon: Ileana Roup, MD;  Location: [facility] ORS;  Service: General;  Laterality: N/A;  . COLON SURGERY     laparascopic vs. open sigmoidectomy with cystoscopy  Dr. Dema Severin and Dr. Gloriann Loan 03-20-18  . COLONOSCOPY WITH PROPOFOL N/A 01/17/2018   Procedure: COLONOSCOPY WITH PROPOFOL;  Surgeon: Milus Banister, MD;  Location: [facility] ENDOSCOPY;  Service: Endoscopy;  Laterality: N/A;  . CORONARY ARTERY BYPASS GRAFT  06/26/2011   Procedure: CORONARY ARTERY BYPASS GRAFTING (CABG);  Surgeon: Melrose Nakayama, MD;  Location: [place];  Service:  Open Heart Surgery;  Laterality: N/A;  times using Greater Saphenous Vein Graft harvested endoscopically from right leg  . CYSTOSCOPY W/ URETERAL STENT PLACEMENT Bilateral 03/20/2018   Procedure: CYSTOSCOPY WITH BILATERAL RETROGRADE AND BILATERAL URETERAL CATHETER PLACEMENT;  Surgeon: Lucas Mallow, MD;  Location: [facility] ORS;  Service: Urology;  Laterality: Bilateral;  . DIAGNOSTIC LAPAROSCOPY    . DILATION AND CURETTAGE OF UTERUS    . JOINT REPLACEMENT    . KNEE ARTHROSCOPY Bilateral   . LAPAROSCOPIC CHOLECYSTECTOMY    . POLYPECTOMY  01/17/2018   Procedure: POLYPECTOMY;  Surgeon: Milus Banister, MD;  Location: [facility] ENDOS[facility];  Service: Endoscopy;;  . REVISION TOTAL KNEE ARTHROPLASTY Right   . SPINAL CORD STIMULATOR IMPLANT  12/10/15  . SPINAL CORD STIMULATOR REMOVAL N/A 04/10/2019   Procedure: Spinal cord stimulator removal, thoracic paddle and generator;  Surgeon: Kristeen Miss, MD;  Location: [place];  Service: Neurosurgery;  Laterality: N/A;  Spinal cord stimulator removal, thoracic paddle and generator  . TAKE DOWN OF INTESTINAL FISTULA N/A 03/20/2018   Procedure: LAPAROSCOPIC TAKE DOWN OF COLOVESICAL FISTULA;  Surgeon: Ileana Roup, MD;  Location: [facility] ORS;  Service: General;  Laterality: N/A;  . TONSILLECTOMY    . TOTAL KNEE ARTHROPLASTY Bilateral     Social History:  reports that she is a non-smoker but has been exposed to tobacco smoke. She has been exposed to 0.00 packs per day for the past 0.00 years. She has never used smokeless tobacco. She reports current alcohol use. She reports that she does not use drugs.  Allergies:  Allergies  Allergen Reactions  . Oxycodone Other (See Comments)    Hallucinations   . Oxycodone Other (See Comments)    Halluciations  . Sulfa Antibiotics Itching  . Tramadol Other (See Comments)    Hallucinations  . Tramadol Other (See Comments)    Halluciations  . Sulfa Antibiotics Itching    Vaginal itching  . Sulfacetamide Sodium Itching     Vaginal itching    (Not in a hospital admission)   Blood pressure (!) 110/47, pulse (!) 55, temperature 98.1 F (36.7 C), temperature source Oral, resp. rate 17, height 5\' 4"  (1.626 m), weight 92.5 kg, SpO2 97 %. Physical Exam:  General: pleasant, WD, overweight white female who is laying in bed in NAD HEENT: head is normocephalic, atraumatic.  Sclera are noninjected.  PERRL.  Ears and nose without any masses or lesions.  Mouth is pink and moist Heart: sinus brady with HR in the 50s.  Normal s1,s2. No obvious murmurs, gallops, or rubs noted.  Palpable radial and pedal pulses bilaterally Lungs: No ecchymosis of left CTAB, no wheezes, rhonchi, or rales noted.  Respiratory effort nonlabored. Patient able to maintain saturations in the mid 90s on 0.5 L supplemental oxygen, drops to low 90s and high 80s on room air Abd: soft, NT, ND, +BS, colostomy in place with viable stoma MS: R forearm with ecchymosis and hematoma without drainage or signs of infection, ROM grossly intact in R elbow and R wrist; LUE ROM grossly intact; BL lower extremities are symmetrical with no cyanosis, clubbing, or edema. Skin: warm and dry with no masses, lesions, or rashes Neuro: Cranial nerves 2-12 grossly intact, sensation grossly intact throughout Psych: A&Ox3 with an appropriate affect.   Results for orders placed or performed during the hospital encounter of 07/23/19 (from the past 48 hour(s))  Basic metabolic panel     Status: Abnormal   Collection Time: 07/23/19  6:46 AM  Result Value Ref Range   Sodium 138 135 - 145 mmol/L   Potassium 4.5 3.5 - 5.1 mmol/L   Chloride 102 98 - 111 mmol/L   CO2 27 22 - 32 mmol/L   Glucose, Bld 163 (H) 70 - 99 mg/dL    Comment: Glucose reference range applies only to samples taken after fasting for at least 8 hours.   BUN 18 8 - 23 mg/dL   Creatinine, Ser 1.10 (H) 0.44 - 1.00 mg/dL   Calcium 8.5 (L) 8.9 - 10.3 mg/dL   GFR calc non Af Amer 47 (L) >60 mL/min   GFR calc Af Amer  55 (L) >60 mL/min   Anion gap 9 5 - 15    Comment: Performed at [facility] [address], [place], [place] [address]  CBC     Status: Abnormal   Collection Time: 07/23/19  6:46 AM  Result Value Ref Range   WBC 11.4 (H) 4.0 - 10.5 K/uL   RBC 3.71 (L) 3.87 - 5.11 MIL/uL   Hemoglobin 11.1 (L) 12.0 - 15.0 g/dL   HCT 36.1 36.0 - 46.0 %   MCV 97.3  80.0 - 100.0 fL   MCH 29.9 26.0 - 34.0 pg   MCHC 30.7 30.0 - 36.0 g/dL   RDW 12.9 11.5 - 15.5 %   Platelets 269 150 - 400 K/uL   nRBC 0.0 0.0 - 0.2 %    Comment: Performed at [facility] [address] [place], [place] [address]  Troponin I (High Sensitivity)     Status: None   Collection Time: 07/23/19  6:46 AM  Result Value Ref Range   Troponin I (High Sensitivity) 10 <18 ng/L    Comment: (NOTE) Elevated high sensitivity troponin I (hsTnI) values and significant  changes across serial measurements may suggest ACS but many other  chronic and acute conditions are known to elevate hsTnI results.  Refer to the "Links" section for chest pain algorithms and additional  guidance. Performed at [facility], [place] [address], [place], [place] [address]   Troponin I (High Sensitivity)     Status: None   Collection Time: 07/23/19  9:08 AM  Result Value Ref Range   Troponin I (High Sensitivity) 9 <18 ng/L    Comment: (NOTE) Elevated high sensitivity troponin I (hsTnI) values and significant  changes across serial measurements may suggest ACS but many other  chronic and acute conditions are known to elevate hsTnI results.  Refer to the "Links" section for chest pain algorithms and additional  guidance. Performed at [facility], [place] [address], [place], [place] [address]    DG Chest 2 View  Result Date: 07/23/2019 CLINICAL DATA:  Chest pain. EXAM: CHEST - 2 VIEW COMPARISON:  07/21/2019 FINDINGS: The heart is mildly enlarged but stable. Stable surgical changes from bypass surgery. Mild chronic bronchitic type changes  but no infiltrates or effusions. Streaky basilar scarring changes. The bony thorax is intact. IMPRESSION: Chronic lung changes but no acute pulmonary findings. Electronically Signed   By: Marijo Sanes M.D.   On: 07/23/2019 07:18   CT Head Wo Contrast  Result Date: 07/21/2019 CLINICAL DATA:  Fall, on blood thinners EXAM: CT HEAD WITHOUT CONTRAST CT CERVICAL SPINE WITHOUT CONTRAST TECHNIQUE: Multidetector CT imaging of the head and cervical spine was performed following the standard protocol without intravenous contrast. Multiplanar CT image reconstructions of the cervical spine were also generated. COMPARISON:  None. FINDINGS: CT HEAD FINDINGS Brain: No evidence of acute infarction, hemorrhage, hydrocephalus, extra-axial collection or mass lesion/mass effect. Subcortical white matter and periventricular small vessel ischemic changes. Vascular: Intracranial atherosclerosis. Skull: Normal. Negative for fracture or focal lesion. Sinuses/Orbits: The visualized paranasal sinuses are essentially clear. The mastoid air cells are unopacified. Other: None. CT CERVICAL SPINE FINDINGS Alignment: Normal cervical lordosis. Skull base and vertebrae: No acute fracture. No primary bone lesion or focal pathologic process. Soft tissues and spinal canal: No prevertebral fluid or swelling. No visible canal hematoma. Disc levels: Mild degenerative changes, most prominent at C5-6. Spinal canal is patent. Upper chest: Evaluated on dedicated CT chest. Other: Visualized thyroid is unremarkable. IMPRESSION: No evidence of acute intracranial abnormality. Small vessel ischemic changes. No evidence of traumatic injury to the cervical spine. Mild degenerative changes. Electronically Signed   By: Julian Hy M.D.   On: 07/21/2019 16:22   CT Chest Wo Contrast  Result Date: 07/23/2019 CLINICAL DATA:  Right-sided chest pain after fall 2 days ago EXAM: CT CHEST WITHOUT CONTRAST TECHNIQUE: Multidetector CT imaging of the chest was  performed following the standard protocol without IV contrast. COMPARISON:  X-ray 07/23/2019, CT 07/21/2019 05/05/2015 FINDINGS: Cardiovascular:  Cardiomegaly. No pericardial effusion. Thoracic aorta is nonaneurysmal. Atherosclerotic calcifications of the aorta and coronary arteries. Prior CABG. Mediastinum/Nodes: No enlarged mediastinal or axillary lymph nodes. Thyroid gland, trachea, and esophagus demonstrate no significant findings. Lungs/Pleura: Mild bibasilar subsegmental atelectasis. No new or enlarging pulmonary nodules. No focal airspace consolidation, pleural effusion, or pneumothorax. Upper Abdomen: No acute abnormality. Musculoskeletal: Acute nondisplaced fractures of the anterior aspects of the right fourth, fifth, and sixth ribs as well as the lateral aspect of the right seventh and eighth ribs. Thoracic vertebral body heights and alignment are maintained. Prior median sternotomy. IMPRESSION: 1. Acute nondisplaced fractures of the anterior aspects of the right fourth, fifth, and sixth ribs as well as the lateral aspect of the right seventh and eighth ribs. No pneumothorax. 2. Cardiomegaly. 3. Aortic and coronary artery atherosclerosis. (ICD10-I70.0) Electronically Signed   By: Davina Poke D.O.   On: 07/23/2019 08:39   CT Chest W Contrast  Result Date: 07/21/2019 CLINICAL DATA:  Fall, chest pain, on blood thinners EXAM: CT CHEST, ABDOMEN, AND PELVIS WITH CONTRAST TECHNIQUE: Multidetector CT imaging of the chest, abdomen and pelvis was performed following the standard protocol during bolus administration of intravenous contrast. CONTRAST:  197mL OMNIPAQUE IOHEXOL 300 MG/ML  SOLN COMPARISON:  CTA chest dated 05/05/2015. CT abdomen/pelvis dated 11/19/2017. FINDINGS: CT CHEST FINDINGS Cardiovascular: No evidence of traumatic aortic injury. Cardiomegaly.  No pericardial effusion. No evidence of thoracic aortic aneurysm. Atherosclerotic calcifications of the aortic arch. Three vessel coronary  atherosclerosis. Postsurgical changes related to prior CABG. Mediastinum/Nodes: No evidence of anterior mediastinal hematoma. No suspicious mediastinal lymphadenopathy. Visualized thyroid is unremarkable. Lungs/Pleura: Mild biapical pleural-parenchymal scarring. Mild subpleural reticulation, lower lobe predominant, suggesting mild chronic interstitial lung disease. No focal consolidation. No suspicious pulmonary nodules. 4 mm nodule in the medial left lung base (series 4/image 136), unchanged from 2019, benign. No pleural effusion or pneumothorax. Musculoskeletal: Degenerative changes of the thoracic spine. Median sternotomy. No fracture is seen. Specifically, the sternum, bilateral scapulae, clavicles, and bilateral ribs are intact. CT ABDOMEN PELVIS FINDINGS Hepatobiliary: Liver is within normal limits. No perihepatic fluid/hemorrhage. Status post cholecystectomy. No intrahepatic or extrahepatic ductal dilatation. Pancreas: Within normal limits. Spleen: 9 mm cyst along the lateral spleen (series 3/image 62), minimally progressive from 2019, benign. No perisplenic fluid/hemorrhage. Adrenals/Urinary Tract: Adrenal glands are within normal limits. Kidneys are within normal limits.  No hydronephrosis. Bladder is within normal limits. Stomach/Bowel: Adrenal glands are within normal limits Vascular/Lymphatic: Stomach is within normal limits. No evidence of bowel obstruction. Normal appendix (series 3/image 89). Status post left hemicolectomy with left mid distal transverse colostomy. Associated parastomal hernia containing fat and a loop of transverse colon (series 3/image 91). Reproductive: Uterus is within normal limits. Bilateral ovaries are within normal limits. Other: No abdominopelvic ascites. 3.1 x 4.4 cm fat and soft tissue density mass in the left retroperitoneum (series 2/image 93), previously 2.4 x 3.1 cm when measured in a similar fashion in 2019. Additional soft tissue nodularity medially (series 3/images  92 and 96). No hemoperitoneum or free air. Musculoskeletal: Degenerative changes of the lumbar spine. No fracture is seen. Specifically, the visualized bony pelvis and bilateral hips are intact. IMPRESSION: No evidence of traumatic injury to the chest, abdomen, or pelvis. 4.4 cm fat and soft tissue mass in the left retroperitoneum, previously 3.1 cm in 2019. 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 Additional ancillary findings as above. Electronically Signed   By: Julian Hy M.D.   On: 07/21/2019 16:48   CT Cervical  Spine Wo Contrast  Result Date: 07/21/2019 CLINICAL DATA:  Fall, on blood thinners EXAM: CT HEAD WITHOUT CONTRAST CT CERVICAL SPINE WITHOUT CONTRAST TECHNIQUE: Multidetector CT imaging of the head and cervical spine was performed following the standard protocol without intravenous contrast. Multiplanar CT image reconstructions of the cervical spine were also generated. COMPARISON:  None. FINDINGS: CT HEAD FINDINGS Brain: No evidence of acute infarction, hemorrhage, hydrocephalus, extra-axial collection or mass lesion/mass effect. Subcortical white matter and periventricular small vessel ischemic changes. Vascular: Intracranial atherosclerosis. Skull: Normal. Negative for fracture or focal lesion. Sinuses/Orbits: The visualized paranasal sinuses are essentially clear. The mastoid air cells are unopacified. Other: None. CT CERVICAL SPINE FINDINGS Alignment: Normal cervical lordosis. Skull base and vertebrae: No acute fracture. No primary bone lesion or focal pathologic process. Soft tissues and spinal canal: No prevertebral fluid or swelling. No visible canal hematoma. Disc levels: Mild degenerative changes, most prominent at C5-6. Spinal canal is patent. Upper chest: Evaluated on dedicated CT chest. Other: Visualized thyroid is unremarkable. IMPRESSION: No evidence of acute intracranial abnormality. Small vessel ischemic changes. No evidence of traumatic injury to  the cervical spine. Mild degenerative changes. Electronically Signed   By: Julian Hy M.D.   On: 07/21/2019 16:22   CT Abdomen Pelvis W Contrast  Result Date: 07/21/2019 CLINICAL DATA:  Fall, chest pain, on blood thinners EXAM: CT CHEST, ABDOMEN, AND PELVIS WITH CONTRAST TECHNIQUE: Multidetector CT imaging of the chest, abdomen and pelvis was performed following the standard protocol during bolus administration of intravenous contrast. CONTRAST:  158mL OMNIPAQUE IOHEXOL 300 MG/ML  SOLN COMPARISON:  CTA chest dated 05/05/2015. CT abdomen/pelvis dated 11/19/2017. FINDINGS: CT CHEST FINDINGS Cardiovascular: No evidence of traumatic aortic injury. Cardiomegaly.  No pericardial effusion. No evidence of thoracic aortic aneurysm. Atherosclerotic calcifications of the aortic arch. Three vessel coronary atherosclerosis. Postsurgical changes related to prior CABG. Mediastinum/Nodes: No evidence of anterior mediastinal hematoma. No suspicious mediastinal lymphadenopathy. Visualized thyroid is unremarkable. Lungs/Pleura: Mild biapical pleural-parenchymal scarring. Mild subpleural reticulation, lower lobe predominant, suggesting mild chronic interstitial lung disease. No focal consolidation. No suspicious pulmonary nodules. 4 mm nodule in the medial left lung base (series 4/image 136), unchanged from 2019, benign. No pleural effusion or pneumothorax. Musculoskeletal: Degenerative changes of the thoracic spine. Median sternotomy. No fracture is seen. Specifically, the sternum, bilateral scapulae, clavicles, and bilateral ribs are intact. CT ABDOMEN PELVIS FINDINGS Hepatobiliary: Liver is within normal limits. No perihepatic fluid/hemorrhage. Status post cholecystectomy. No intrahepatic or extrahepatic ductal dilatation. Pancreas: Within normal limits. Spleen: 9 mm cyst along the lateral spleen (series 3/image 62), minimally progressive from 2019, benign. No perisplenic fluid/hemorrhage. Adrenals/Urinary Tract: Adrenal  glands are within normal limits. Kidneys are within normal limits.  No hydronephrosis. Bladder is within normal limits. Stomach/Bowel: Adrenal glands are within normal limits Vascular/Lymphatic: Stomach is within normal limits. No evidence of bowel obstruction. Normal appendix (series 3/image 89). Status post left hemicolectomy with left mid distal transverse colostomy. Associated parastomal hernia containing fat and a loop of transverse colon (series 3/image 91). Reproductive: Uterus is within normal limits. Bilateral ovaries are within normal limits. Other: No abdominopelvic ascites. 3.1 x 4.4 cm fat and soft tissue density mass in the left retroperitoneum (series 2/image 93), previously 2.4 x 3.1 cm when measured in a similar fashion in 2019. Additional soft tissue nodularity medially (series 3/images 92 and 96). No hemoperitoneum or free air. Musculoskeletal: Degenerative changes of the lumbar spine. No fracture is seen. Specifically, the visualized bony pelvis and bilateral hips are intact. IMPRESSION: No  evidence of traumatic injury to the chest, abdomen, or pelvis. 4.4 cm fat and soft tissue mass in the left retroperitoneum, previously 3.1 cm in 2019. 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 Additional ancillary findings as above. Electronically Signed   By: Julian Hy M.D.   On: 07/21/2019 16:48   DG Chest Portable 1 View  Result Date: 07/21/2019 CLINICAL DATA:  Status post fall with a blow to the left side of the chest. Initial encounter. EXAM: PORTABLE CHEST 1 VIEW COMPARISON:  Single-view of the chest 09/24/2017. FINDINGS: The lungs are clear. No pneumothorax or pleural effusion. There is cardiomegaly. The patient is status post CABG with atherosclerosis noted. No acute or focal bony abnormality. IMPRESSION: No acute disease. Cardiomegaly. Atherosclerosis. Electronically Signed   By: Inge Rise M.D.   On: 07/21/2019 14:55      Assessment/Plan Atrial  fibrillation on eliquis - sinus brady today on my exam CHF CAD HTN HLD Addison's disease Chronic bronchitis GERD Hypothyroidism Migraines Osteoarthritis Depression/Anxiety Chronic back pain - s/p recent removal of spinal stimulator, patient is in outpatient rehab program currently  Colostomy in place  AKI - Cr 1.1 from 0.84 2 days ago, patient reports decreased urine output and decreased PO intake  Fall 2 days ago - Hx of 2 other falls, pt reports losing balance each time R 4-6 anterior rib fractures and 7-8 lateral rib fractures - not noted on initial workup 2 days ago but patient returns today with worsened pain - recommend repeat CXR tomorrow AM - multimodal pain control, pulmonary toilet, IS, PT/PT - wean supplemental oxygen as able R forearm hematoma - wrap with dry gauze, heat/ice prn, hgb 11.1 from 13.3  FEN: clear for diet from trauma standpoint VTE: clear to resume eliquis when hgb stable ID: no current abx indicated from a trauma standpoint  Recommend medical admission for multiple medical problems with fall after loss of balance. We will consult for pain management for rib fractures.   Norm Parcel, [facility] 07/23/2019, 10:39 AM Please see Amion for pager number during day hours 7:00am-4:30pm

## 2019-07-23 NOTE — ED Triage Notes (Signed)
Pt had a fall 2 days ago hit her chest wall with a glass table and she has increase chest wall pain and SOB. Pt is chronic A fib on the monitor. BP 133/98, HR 70, R-20, SPO2 94% RA.

## 2019-07-23 NOTE — ED Notes (Signed)
Pt son Aaron Edelman is in [place] on vacation, but is available by phone anytime except Thursday 3-7p as he will be flying back. He expects to be at the hospital early after noon on Friday.

## 2019-07-23 NOTE — H&P (Addendum)
Date: 07/23/2019               Patient Name:  [name] MRN: [id number]  DOB: Aug 24, 1940 Age / Sex: 79 y.o., female   PCP: Patient, No Pcp Per         Medical Service: Internal Medicine Teaching Service         Attending Physician: Dr. Sid Falcon, MD    First Contact: Dr. Benjamine Mola Pager: [phone]  Second Contact: Dr. Truman Hayward Pager: [phone]       After Hours (After 5p/  First Contact Pager: [phone]  weekends / holidays): Second Contact Pager: [phone]   Chief Complaint: Chest pain and right arm pain  History of Present Illness:  Ms. Wauters is a 79 y.o f with cad, hypothyroidism, hypertension, addison's disease, atrial fibrillation, colostomy, osa who presents with chest pain post a fall two days ago. The patient describes the pain as present over her precordial area, 8/10 in intensity, constantly there, worsened with movement and deep breaths.  She did not find anything that alleviated the pain.  The patient has accompanied right arm pain.  The patient stated that she leaned down to feed her cat and she subsequently fell forward onto a glass table.  She denies any prodromal symptoms of lightheadedness/dizziness, chest pain, dyspnea, palpitations.  She did not lose any consciousness or hit her head.  The patient does get anticoagulation with Xarelto.  The patient presented to the ED for evaluation subsequent to her fall.  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   The patient states that she had more frequent falls recently with her most recent fall being a few months ago.  Prior to her last fall also she was bending down.  Of note, the  patient states that she has been having worsening back pain over the past year especially in the thoracic spine region.  She has been using a rolling walker to get around the house.  She also has access to a wheelchair and a cane.  She is able to feed herself, bathe herself and dress herself.  She has home health assistance that helps her get her groceries and cooks for her.   In the ED, the patient has been afebrile, bradycardic with rates in the 50s, normal respirations, hypotensive 90-110/40-60s, respirating well on room air. Was given morphine and ondensetron.    Meds:   No current facility-administered medications on file prior to encounter.   Current Outpatient Medications on File Prior to Encounter  Medication Sig  . fentaNYL (DURAGESIC) 25 MCG/HR Place 1 patch onto the skin daily.  . potassium chloride (KLOR-CON) 10 MEQ tablet Take 4 tablets (40 mEq total) by mouth 2 (two) times daily.  [name] acetaminophen (TYLENOL) 500 MG tablet Take 500 mg by mouth every 6 (six) hours as needed for mild pain or headache.   [name] amiodarone (PACERONE) 200 MG tablet Take 1 tablet (200 mg total) by mouth daily.  [name] aspirin EC 81 MG tablet Take 81 mg by mouth every Monday, Wednesday, and Friday. In the morning.  . clonazePAM (KLONOPIN) 0.5 MG tablet Take 0.25-0.5 mg by mouth 2 (two) times daily  as needed for anxiety.  . Cyanocobalamin 1000 MCG/ML KIT Inject 1,000 mcg into the muscle every 30 (thirty) days.   . diazepam (VALIUM) 5 MG tablet Take 2.5 mg by mouth See admin instructions. Take 2.5 mg by mouth one to two times a day as needed for anxiety  . diltiazem (CARDIZEM CD) 180 MG 24 hr capsule Take 1 capsule (180 mg total) by mouth daily.  [name] ELIQUIS 5 MG TABS tablet TAKE ONE TABLET TWICE DAILY (Patient taking differently: Take 5 mg by mouth 2 (two) times daily. )  . Evolocumab 140 MG/ML SOAJ Inject 140 mg into the skin every 14 (fourteen) days. (Patient taking differently: Inject 140 mg into the skin every 14  (fourteen) days. Repatha 1st and 15th)  . FLUoxetine (PROZAC) 10 MG capsule Take 10 mg by mouth in the morning.  . furosemide (LASIX) 40 MG tablet Take 2 tablets by mouth in the morning.  Take 1 tablet by mouth after lunch. (Patient taking differently: Take 40 mg by mouth See admin instructions. Take 2 tablets by mouth in the morning.  Take 1 tablet by mouth after lunch.)  . HYDROcodone-acetaminophen (NORCO) 5-325 MG tablet Take 1-2 tablets by mouth every 6 (six) hours as needed for moderate pain.  . hydrocortisone (CORTEF) 20 MG tablet Take 20 mg by mouth in the morning and at bedtime.  [name] levothyroxine (SYNTHROID, LEVOTHROID) 88 MCG tablet Take 1 tablet (88 mcg total) by mouth daily before breakfast.  . magnesium oxide (MAG-OX) 400 MG tablet Take 1 tablet (400 mg total) by mouth daily.  . metaxalone (SKELAXIN) 800 MG tablet Take 1 tablet (800 mg total) by mouth 3 (three) times daily.  . mirabegron ER (MYRBETRIQ) 50 MG TB24 tablet Take 50 mg by mouth daily.  . nebivolol (BYSTOLIC) 2.5 MG tablet Take 2.5 mg by mouth daily.  [name] NITROSTAT 0.4 MG SL tablet ONE TABLET UNDER TONGUE AS NEEDED FOR CHEST PAIN AS DIRECTED (Patient taking differently: Place 0.4 mg under the tongue every 5 (five) minutes as needed for chest pain. )  . omeprazole (PRILOSEC) 20 MG capsule Take 20 mg by mouth daily before breakfast.  . REPATHA SURECLICK 354 MG/ML SOAJ Inject 1 Syringe into the skin every 14 (fourteen) days.   [name] rOPINIRole (REQUIP) 0.25 MG tablet Take 0.5 mg by mouth at bedtime.  . valsartan (DIOVAN) 80 MG tablet TAKE 1/2 TABLET EVERY MORNING AND TAKE ADDITIONAL ONE-HALF TABLET IN THE AFTERNOON FOR BLOOD PRESSURE > 140/80 (Patient taking differently: Take 80 mg by mouth at bedtime. )  . valsartan (DIOVAN) 80 MG tablet Take 80 mg by mouth at bedtime.   . Vitamin D, Ergocalciferol, (DRISDOL) 50000 UNITS CAPS Take 50,000 Units by mouth 2 (two) times a week. Takes on Tuesdays and Fridays     Allergies: Allergies as  of 07/23/2019 - Review Complete 07/23/2019  Allergen Reaction Noted  . Oxycodone Other (See Comments) 11/20/2018  . Oxycodone Other (See Comments) 07/21/2019  . Sulfa antibiotics Itching 07/21/2019  . Tramadol Other (See Comments) 06/19/2017  . Tramadol Other (See Comments) 07/21/2019  . Sulfa antibiotics Itching 06/23/2011  . Sulfacetamide sodium Itching 01/01/2015   Past Medical History:  Diagnosis Date  . Addison's disease ([place])   . Anxiety   . Arthritis    "everywhere"  . Atrial fibrillation ([facility])    Eliquis  . B12 deficiency   . Back pain   . CHF (congestive heart failure) ([place])   . Chronic bronchitis ([place])    "although  I didn't get it this year" (07/23/2017)  . Colostomy in place [facility])   . 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  last knee surgery dr. Elmyra Ricks gave large dose of hydrocortisal and avoided the hypotensive side effectas of addison's disease.  . Coronary artery disease   . Depression   . GERD (gastroesophageal reflux disease)   . Hyperlipemia   . Hypertension   . Hypothyroidism   . Lumbar radiculopathy 04/27/2014  . Migraine    "in my 20's" (07/23/2017)  . Osteoarthritis   . Palpitations   . Recurrent upper respiratory infection (URI)   . Sleep apnea    uses a cpap   . SOB (shortness of breath)   . UTI (lower urinary tract infection) 07/09/2011  . Vitamin D deficiency     Family History:   Family History  Problem Relation Age of Onset  . Heart attack Father   . Heart disease Father   . Hypertension Mother   . Stroke Mother   . Hypertension Maternal Grandfather   . Colon cancer Neg Hx    Social History: Patient lives alone in a single-family home.  Is married but her husband passed away, son lives in [place].    Never smoker, does not drink EtOH, no drug use  Review of Systems: A complete ROS was negative except as per HPI.   Physical Exam: Blood pressure 116/60, pulse (!) 58, temperature  98.1 F (36.7 C), temperature source Oral, resp. rate 16, height '5\' 4"'  (1.626 m), weight 92.5 kg, SpO2 100 %.  Physical Exam  Constitutional: She is oriented to person, place, and time. She appears well-developed and well-nourished. No distress.  HENT:  Head: Normocephalic and atraumatic.  Cardiovascular: Normal rate, regular rhythm and normal heart sounds.  Respiratory: Effort normal and breath sounds normal. No respiratory distress. She has no wheezes. She exhibits tenderness (to palpation).  GI: Soft. Bowel sounds are normal. She exhibits no distension.  Neurological: She is alert and oriented to person, place, and time.  Skin: She is not diaphoretic. There is erythema (right upper arm ).  2-3cm in diameter raised firm area on right upper forearm  Psychiatric: She has a normal mood and affect. Her behavior is normal. Judgment and thought content normal.        Labs:  Bmp: na 138, k 4.5, bicarb 27, glu 163, bun 18, cr 1.1 CBC:  Wbc 11.4, hb 11, hct 36, plt 269 Troponin: 10 > 9 COVID-19 negative  EKG: personally reviewed my interpretation is sinus bradycardia with flutter  CXR: personally reviewed my interpretation is poorly penetrated, no effusions or infiltrates. Read as streaky basilar scarring.  CT Chest: acute nondisplaced fracture on anterior right 4-6th ribs as well as lateral aspect of right 7-8th ribs. No pneumothrorax.    Assessment & Plan by Problem: Active Problems:   Rib fracture  Ms. Bellavance is a  79 y.o f with cad, hypothyroidism, hypertension, addison's disease, atrial fibrillation, colostomy, osa who presents with chest pain post a fall two days ago.  Fall Patient has been having frequent falls without any prodromal symptoms. She states that she did not trip over anything and the fall occurred after she was leaning forward to feed her cat. Patient has a strong cardiac history so will place on cardiac monitoring to ensure that she does not have any rhythm  abnormality that could be contributing. Patient was already given fluids by ED so orthostatic vital signs  are likely to not be helpful. The patient has chronic back pain that she has been struggling with. She uses a rolling walker to ambulate, but stated she was not using one when feeding the cat. I suspect that the back pain maybe limiting her range of motion and affecting her balance. Of note the patient had a nonfunctioning spinal cord stimulator that was removed in January 2021 after multiple failed attempts at resetting the device.   -PT and OT assessment  -Follow up with neurosurgery -Ambulatory home assessment to prevent falls  Rib fracture Patient with acute nondisplaced anterior rib fractures on the right 4-6 anterior rib fractures and 7-8 lateral rib fractures that were not noted on prior imaging 3 days ago.  The surgery was consulted and they recommended a repeat chest x-ray tomorrow a.m. 4/22 and recommended analgesic control.  They will continue to follow the patient.  -Incentive spirometry -Repeat chest x-ray 4/22 -Pain control: Morphine 2-4 mg every 3 hours as needed, Norco 5-325 mg every 6 hours as needed, acetaminophen 650 every 6 hours, Robaxin 500 mg 3 times daily -PT and OT  Right forearm hematoma The patient is noted to have right forearm erythema that is quite significant. There was a 2-3 cm in diameter area that was raised.  We will get a CT right forearm to evaluate soft tissue injury versus underlying fracture.  -CT right forearm  Persistent atrial fibrillation Patient's pulse has ranged 50-60s and she is hemodynamically stable. Patient failed dofetilide and has been on amiodarone.   -Amiodarone 200 mg daily -Diltiazem 180 mg daily  Dyspnea Patient's last echo was done 09/20/2015 ef 65-70%, Moderate lvh. Per last cardiology visit in February 2021 there is some concern for possible amiodarone associated lung injury and they increased lasix to 80/40am and pm.    -continue lasix 41m bid home dose  Hypothyroidism -Continue home levothyroxine 88 mcg daily  Addison's disease  -Continue home hydrocortisone 20 mg twice daily  Coronary artery disease status post CABG  -Continue aspirin 81 mg daily  Chronic back pain Most recent cervical spine imaging showed mild degenerative changes without any traumatic injury on 07/21/2019.  Lumbar cervical spine 02/10/2019 showed mild foraminal narrowing at L1-L2 R >left, moderate central and bilateral foraminal stenosis L2-L3 that has progressed, bilateral foraminal stenosis at L3-L4, fusion at L L4-L5 with mild left subarticular and moderate bilateral foraminal stenosis, central canal stenosis at L1--L2, L2-3,l3- 4.  -Patient needs follow-up regarding this with neurosurgery outpatient -PT and OT  Depression   -continue fluoxetine 138mqd   Dispo: Admit patient to Observation with expected length of stay less than 2 midnights.  Signed: Lars MageMD 07/23/2019, 3:26 PM

## 2019-07-23 NOTE — ED Notes (Signed)
RN was able to find a monitor to place patient on telemetry monitoring.

## 2019-07-23 NOTE — ED Notes (Signed)
Pt transported to CT ?

## 2019-07-23 NOTE — ED Notes (Signed)
Pt was in severe pain unrelieved by repositioning.  Pt moaning in pain.  Changed sheets which had become wet from ice pack and medicated pt to ensure relief of severe pain. Placed on cont spo2 monitoring to be able to closely observe pt

## 2019-07-23 NOTE — ED Provider Notes (Signed)
Emergency Department Provider Note   I have reviewed the triage vital signs and the nursing notes.   HISTORY  Chief Complaint Fall and Chest Pain   HPI [name] is a 79 y.o. female with PMH reviewed below presents to the ED with right anterior chest pain worsening since a fall 2 days prior.  At that time the patient lost her balance and fell striking her chest against a glass table.  Patient states that she fell very hard and had severe pain in that area.  She presented to the emergency department at which time x-rays and along with CT scans were performed.  No clear injury identified.  Patient was discharged home with pain medications but symptoms continue to worsen.  She states that she has been mainly laying in bed because she has severe pain with movement.  She denies any fevers, productive cough, chills.  Pain is in the same location from when she fell.  No radiation of symptoms or other modifying factors.  She denies abdominal or back pain.  Denies any numbness or weakness in the extremities.  No headaches.   Past Medical History:  Diagnosis Date  . Addison's disease ([place])   . Anxiety   . Arthritis    "everywhere"  . Atrial fibrillation (HCC)    Eliquis  . B12 deficiency   . Back pain   . CHF (congestive heart failure) ([place])   . Chronic bronchitis ([place])    "although I didn't get it this year" (07/23/2017)  . Colostomy in place [facility])   . 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  last knee surgery dr. Elmyra Ricks gave large dose of hydrocortisal and avoided the hypotensive side effectas of addison's disease.  . Coronary artery disease   . Depression   . GERD (gastroesophageal reflux disease)   . Hyperlipemia   . Hypertension   . Hypothyroidism   . Lumbar radiculopathy 04/27/2014  . Migraine    "in my 20's" (07/23/2017)  . Osteoarthritis   . Palpitations   . Recurrent upper respiratory infection (URI)   . Sleep apnea    uses a cpap   . SOB (shortness of breath)   . UTI (lower urinary tract infection) 07/09/2011  . Vitamin D deficiency     Patient Active Problem List   Diagnosis Date Noted  . Rib fracture 07/23/2019  . Class 2 drug-induced obesity with serious comorbidity and body mass index (BMI) of 36.0 to 36.9 in adult 02/19/2019  . S/P insertion of spinal cord stimulator 01/09/2019  . Spondylosis without myelopathy or radiculopathy, lumbar region 01/09/2019  . 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 Coronary artery disease 10/17/2018  . Mesenteric mass (lipoma) s/p excision 03/20/2018 03/25/2018  . Diverticulitis of sigmoid colon 03/25/2018  . S/P laparoscopic-assisted sigmoidectomy 03/20/2018  . Colonic mass 03/20/2018  . History of adenomatous polyp of colon   . Polyp of ascending colon   . 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 Orthostatic hypotension 09/24/2017  . Visit for monitoring Tikosyn therapy 07/23/2017  . Depression 06/15/2017  . Acute respiratory failure with hypoxia ([place])   . Hypothyroidism   . Bronchitis 03/22/2016  . Abnormal CT of the chest 03/16/2016  . Coronary artery disease involving coronary bypass graft of native heart with angina pectoris ([place]) 03/16/2016  . Chronic anticoagulation 10/06/2015  . On dofetilide therapy 10/06/2015  . Atrial fibrillation ([place]) 09/15/2015  . Snoring 02/17/2015  .  Chronic low back pain 11/09/2014  . Disc degeneration, lumbar 11/09/2014  . Addison disease ([place]) 09/30/2014  . Chronic back pain 09/30/2014  . Hematochezia-(Hgb stable) 09/03/2014  . DOE (dyspnea on exertion) 09/01/2014  . Lumbar radiculopathy 04/27/2014  . Osteoarthritis 11/18/2008  . Hyperlipidemia 11/17/2008  . Essential hypertension 11/17/2008    Past Surgical History:  Procedure Laterality Date  . ACHILLES TENDON SURGERY Left 07/16/2014   bone spur removed  . ANTERIOR LUMBAR FUSION  1980   L4-5  . BACK SURGERY    .  CARDIAC CATHETERIZATION  2013  . CARDIAC CATHETERIZATION N/A 09/02/2014   Procedure: Left Heart Cath and Coronary Angiography;  Surgeon: Wellington Hampshire, MD;  Location: [place] [facility];  Service: Cardiovascular;  Laterality: N/A;  . CARDIOVERSION N/A 10/22/2015   Procedure: CARDIOVERSION;  Surgeon: Sanda Klein, MD;  Location: [place] [facility];  Service: Cardiovascular;  Laterality: N/A;  . CARDIOVERSION N/A 06/07/2017   Procedure: CARDIOVERSION;  Surgeon: Larey Dresser, MD;  Location: [facility]COPY;  Service: Cardiovascular;  Laterality: N/A;  . CARDIOVERSION N/A 07/05/2017   Procedure: CARDIOVERSION;  Surgeon: Pixie Casino, MD;  Location: [facility] ENDOSCOPY;  Service: Cardiovascular;  Laterality: N/A;  . CARDIOVERSION N/A 09/30/2018   Procedure: CARDIOVERSION;  Surgeon: Buford Dresser, MD;  Location: [facility] ENDOSCOPY;  Service: Cardiovascular;  Laterality: N/A;  . CARDIOVERSION N/A 11/26/2018   Procedure: CARDIOVERSION;  Surgeon: Sanda Klein, MD;  Location: [facility] ENDOSCOPY;  Service: Cardiovascular;  Laterality: N/A;  . CARPAL TUNNEL RELEASE Right   . CATARACT EXTRACTION, BILATERAL Bilateral   . COLON RESECTION N/A 03/20/2018   Procedure: LAPAROSCOPIC SIGMOIDECTOMY WITH COLOSTOMY;  Surgeon: Ileana Roup, MD;  Location: [facility];  Service: General;  Laterality: N/A;  . COLON SURGERY     laparascopic vs. open sigmoidectomy with cystoscopy  Dr. Dema Severin and Dr. Gloriann Loan 03-20-18  . COLONOSCOPY WITH PROPOFOL N/A 01/17/2018   Procedure: COLONOSCOPY WITH PROPOFOL;  Surgeon: Milus Banister, MD;  Location: [facility];  Service: Endoscopy;  Laterality: N/A;  . CORONARY ARTERY BYPASS GRAFT  06/26/2011   Procedure: CORONARY ARTERY BYPASS GRAFTING (CABG);  Surgeon: Melrose Nakayama, MD;  Location: [place];  Service: Open Heart Surgery;  Laterality: N/A;  times using Greater Saphenous Vein Graft harvested endoscopically from right leg  . CYSTOSCOPY W/ URETERAL STENT PLACEMENT Bilateral 03/20/2018    Procedure: CYSTOSCOPY WITH BILATERAL RETROGRADE AND BILATERAL URETERAL CATHETER PLACEMENT;  Surgeon: Lucas Mallow, MD;  Location: [facility];  Service: Urology;  Laterality: Bilateral;  . DIAGNOSTIC LAPAROSCOPY    . DILATION AND CURETTAGE OF UTERUS    . JOINT REPLACEMENT    . KNEE ARTHROSCOPY Bilateral   . LAPAROSCOPIC CHOLECYSTECTOMY    . POLYPECTOMY  01/17/2018   Procedure: POLYPECTOMY;  Surgeon: Milus Banister, MD;  Location: [facility];  Service: Endoscopy;;  . REVISION TOTAL KNEE ARTHROPLASTY Right   . SPINAL CORD STIMULATOR IMPLANT  12/10/15  . SPINAL CORD STIMULATOR REMOVAL N/A 04/10/2019   Procedure: Spinal cord stimulator removal, thoracic paddle and generator;  Surgeon: Kristeen Miss, MD;  Location: [facility];  Service: Neurosurgery;  Laterality: N/A;  Spinal cord stimulator removal, thoracic paddle and generator  . TAKE DOWN OF INTESTINAL FISTULA N/A 03/20/2018   Procedure: LAPAROSCOPIC TAKE DOWN OF COLOVESICAL FISTULA;  Surgeon: Ileana Roup, MD;  Location: [facility];  Service: General;  Laterality: N/A;  . TONSILLECTOMY    . TOTAL KNEE ARTHROPLASTY Bilateral     Allergies Oxycodone, Oxycodone, Sulfa antibiotics, Tramadol,  Tramadol, Sulfa antibiotics, and Sulfacetamide sodium  Family History  Problem Relation Age of Onset  . Heart attack Father   . Heart disease Father   . Hypertension Mother   . Stroke Mother   . Hypertension Maternal Grandfather   . Colon cancer Neg Hx     Social History Social History   Tobacco Use  . Smoking status: Passive Smoke Exposure - Never Smoker  . Smokeless tobacco: Never Used  . Tobacco comment: "husband passed in 2010; father passed 1978"  Substance Use Topics  . Alcohol use: Yes    Alcohol/week: 0.0 standard drinks    Comment: glass of wine occasionally   . Drug use: No    Review of Systems  Constitutional: No fever/chills Eyes: No visual changes. ENT: No sore throat. Cardiovascular: Positive chest pain. Respiratory:  Denies shortness of breath. Gastrointestinal: No abdominal pain.  No nausea, no vomiting.  No diarrhea.  No constipation. Genitourinary: Negative for dysuria. Musculoskeletal: Negative for back pain. Skin: Negative for rash. Neurological: Negative for headaches, focal weakness or numbness.  10-point ROS otherwise negative.  ____________________________________________   PHYSICAL EXAM:  VITAL SIGNS: ED Triage Vitals  Enc Vitals Group     BP 07/23/19 0649 (!) 91/48     Pulse Rate 07/23/19 0649 60     Resp 07/23/19 0649 16     Temp 07/23/19 0649 98.1 F (36.7 C)     Temp Source 07/23/19 0649 Oral     SpO2 07/23/19 0649 94 %     Weight 07/23/19 0637 204 lb (92.5 kg)     Height 07/23/19 0637 5\' 4"  (1.626 m)   Constitutional: Alert and oriented. Well appearing and in no acute distress. Eyes: Conjunctivae are normal.  Head: Atraumatic. Nose: No congestion/rhinnorhea. Mouth/Throat: Mucous membranes are moist.  Neck: No stridor. Cardiovascular: Normal rate, regular rhythm. Good peripheral circulation. Grossly normal heart sounds.   Respiratory: Normal respiratory effort.  No retractions. Lungs CTAB. Gastrointestinal: Soft and nontender. No distention.  Musculoskeletal: No lower extremity tenderness nor edema. No gross deformities of extremities.  Tenderness to palpation over the right sternum and right lateral chest.  No bruising or crepitus.  Neurologic:  Normal speech and language.  Skin:  Skin is warm, dry and intact. No rash noted.  ____________________________________________   LABS (all labs ordered are listed, but only abnormal results are displayed)  Labs Reviewed  BASIC METABOLIC PANEL - Abnormal; Notable for the following components:      Result Value   Glucose, Bld 163 (*)    Creatinine, Ser 1.10 (*)    Calcium 8.5 (*)    GFR calc non Af Amer 47 (*)    GFR calc Af Amer 55 (*)    All other components within normal limits  CBC - Abnormal; Notable for the  following components:   WBC 11.4 (*)    RBC 3.71 (*)    Hemoglobin 11.1 (*)    All other components within normal limits  SARS CORONAVIRUS 2 (TAT 6-24 HRS)  BASIC METABOLIC PANEL  CBC  TROPONIN I (HIGH SENSITIVITY)  TROPONIN I (HIGH SENSITIVITY)   ____________________________________________  EKG   EKG Interpretation  Date/Time:  Wednesday July 23 2019 07:46:16 EDT Ventricular Rate:  55 PR Interval:  268 QRS Duration: 130 QT Interval:  581 QTC Calculation: 556 R Axis:   78 Text Interpretation: Sinus rhythm Prolonged PR interval IVCD, consider atypical RBBB No STEMI Confirmed by Nanda Quinton [phone]) on 07/23/2019 8:02:06 AM  ____________________________________________  RADIOLOGY  DG Chest 2 View  Result Date: 07/23/2019 CLINICAL DATA:  Chest pain. EXAM: CHEST - 2 VIEW COMPARISON:  07/21/2019 FINDINGS: The heart is mildly enlarged but stable. Stable surgical changes from bypass surgery. Mild chronic bronchitic type changes but no infiltrates or effusions. Streaky basilar scarring changes. The bony thorax is intact. IMPRESSION: Chronic lung changes but no acute pulmonary findings. Electronically Signed   By: Marijo Sanes M.D.   On: 07/23/2019 07:18   CT Chest Wo Contrast  Result Date: 07/23/2019 CLINICAL DATA:  Right-sided chest pain after fall 2 days ago EXAM: CT CHEST WITHOUT CONTRAST TECHNIQUE: Multidetector CT imaging of the chest was performed following the standard protocol without IV contrast. COMPARISON:  X-ray 07/23/2019, CT 07/21/2019 05/05/2015 FINDINGS: Cardiovascular: Cardiomegaly. No pericardial effusion. Thoracic aorta is nonaneurysmal. Atherosclerotic calcifications of the aorta and coronary arteries. Prior CABG. Mediastinum/Nodes: No enlarged mediastinal or axillary lymph nodes. Thyroid gland, trachea, and esophagus demonstrate no significant findings. Lungs/Pleura: Mild bibasilar subsegmental atelectasis. No new or enlarging pulmonary nodules. No focal  airspace consolidation, pleural effusion, or pneumothorax. Upper Abdomen: No acute abnormality. Musculoskeletal: Acute nondisplaced fractures of the anterior aspects of the right fourth, fifth, and sixth ribs as well as the lateral aspect of the right seventh and eighth ribs. Thoracic vertebral body heights and alignment are maintained. Prior median sternotomy. IMPRESSION: 1. Acute nondisplaced fractures of the anterior aspects of the right fourth, fifth, and sixth ribs as well as the lateral aspect of the right seventh and eighth ribs. No pneumothorax. 2. Cardiomegaly. 3. Aortic and coronary artery atherosclerosis. (ICD10-I70.0) Electronically Signed   By: Davina Poke D.O.   On: 07/23/2019 08:39   CT FOREARM RIGHT WO CONTRAST  Result Date: 07/23/2019 CLINICAL DATA:  Bruising and swelling of the anterior forearm. No recalled acute injury. EXAM: CT OF THE RIGHT FOREARM WITHOUT CONTRAST TECHNIQUE: Multidetector CT imaging was performed according to the standard protocol. Multiplanar CT image reconstructions were also generated. COMPARISON:  None. FINDINGS: Bones/Joint/Cartilage No evidence of acute fracture, dislocation or bone destruction. Mild degenerative changes within the wrist. Tiny ossific density dorsal to the lunate, best seen on the sagittal images, probably not acute. No significant elbow joint effusion. Ligaments Suboptimally assessed by CT. Muscles and Tendons No gross muscle or tendon abnormality. Soft tissues In the subcutaneous fat posterior to the mid ulna, there is a hyperdense mass measuring 3.7 x 2.7 x 1.6 cm, presumably a hematoma. There is surrounding soft tissue swelling. No evidence of foreign body or soft tissue emphysema. IMPRESSION: 1. Hyperdense mass posterior to the mid ulna, presumably a hematoma. No evidence of foreign body or soft tissue emphysema. Clinical follow-up recommended to ensure resolution and exclude mass lesion. 2. No evidence of acute fracture, dislocation or bone  destruction. Electronically Signed   By: Richardean Sale M.D.   On: 07/23/2019 19:18    ____________________________________________   PROCEDURES  Procedure(s) performed:   Procedures  CRITICAL CARE Performed by: Margette Fast Total critical care time: 35 minutes Critical care time was exclusive of separately billable procedures and treating other patients. Critical care was necessary to treat or prevent imminent or life-threatening deterioration. Critical care was time spent personally by me on the following activities: development of treatment plan with patient and/or surrogate as well as nursing, discussions with consultants, evaluation of patient's response to treatment, examination of patient, obtaining history from patient or surrogate, ordering and performing treatments and interventions, ordering and review of laboratory studies, ordering and review of  radiographic studies, pulse oximetry and re-evaluation of patient's condition.  Nanda Quinton, MD Emergency Medicine  ____________________________________________   INITIAL IMPRESSION / ASSESSMENT AND PLAN / ED COURSE  Pertinent labs & imaging results that were available during my care of the patient were reviewed by me and considered in my medical decision making (see chart for details).   Patient presents to the emergency department for evaluation of chest pain after fall 2 days ago.  She continues to have anterior chest discomfort which is severe and reproducible on exam.  Doubt ACS/PE.  She has no crepitus or bruising.  I reviewed the CT scans from her visit 2 days ago.  Her chest x-ray is negative.  Patient does have some hypoxemia here.  On my exam she was as low as 86% on room air.  In this scenario plan for repeat CT imaging of the chest to evaluate for underlying pulmonary contusion or developing infiltrate not seen on chest x-ray.   CT chest with multiple non-displaced rib fractures. No PNX. Discussed with trauma service  who saw the patient and due to medical complexity at baseline are requesting medicine admit.   Discussed patient's case with teaching service to request admission. Patient and family (if present) updated with plan. Care transferred to teaching service.  I reviewed all nursing notes, vitals, pertinent old records, EKGs, labs, imaging (as available).  ____________________________________________  FINAL CLINICAL IMPRESSION(S) / ED DIAGNOSES  Final diagnoses:  Closed fracture of multiple ribs of right side, initial encounter  Fall from ground level  Hypoxemia     MEDICATIONS GIVEN DURING THIS VISIT:  Medications  ipratropium-albuterol (DUONEB) 0.5-2.5 (3) MG/3ML nebulizer solution 3 mL (3 mLs Nebulization Given 07/23/19 1129)  acetaminophen (TYLENOL) tablet 650 mg (650 mg Oral Given 07/23/19 1736)  methocarbamol (ROBAXIN) tablet 500 mg (500 mg Oral Given 07/23/19 1736)  morphine 2 MG/ML injection 2-4 mg (4 mg Intravenous Given 07/23/19 1421)  lidocaine (LIDODERM) 5 % 1 patch (1 patch Transdermal Patch Applied 07/23/19 1129)  HYDROcodone-acetaminophen (NORCO/VICODIN) 5-325 MG per tablet 1 tablet (1 tablet Oral Given 07/23/19 1421)  enoxaparin (LOVENOX) injection 40 mg (40 mg Subcutaneous Given 07/23/19 1740)  senna-docusate (Senokot-S) tablet 1 tablet (has no administration in time range)  promethazine (PHENERGAN) tablet 12.5 mg (has no administration in time range)  amiodarone (PACERONE) tablet 200 mg (0 mg Oral Hold 07/23/19 1600)  aspirin EC tablet 81 mg (81 mg Oral Given 07/23/19 1738)  diltiazem (CARDIZEM CD) 24 hr capsule 180 mg (180 mg Oral Given 07/23/19 1737)  FLUoxetine (PROZAC) capsule 10 mg (has no administration in time range)  furosemide (LASIX) tablet 40 mg (has no administration in time range)  hydrocortisone (CORTEF) tablet 20 mg (has no administration in time range)  levothyroxine (SYNTHROID) tablet 88 mcg (has no administration in time range)  mirabegron ER (MYRBETRIQ) tablet  50 mg (has no administration in time range)  nitroGLYCERIN (NITROSTAT) SL tablet 0.4 mg (has no administration in time range)  pantoprazole (PROTONIX) EC tablet 40 mg (has no administration in time range)  rOPINIRole (REQUIP) tablet 0.5 mg (has no administration in time range)  irbesartan (AVAPRO) tablet 75 mg (has no administration in time range)  sodium chloride flush (NS) 0.9 % injection 3 mL (3 mLs Intravenous Given 07/23/19 0828)  morphine 4 MG/ML injection 4 mg (4 mg Intravenous Given 07/23/19 0828)  ondansetron (ZOFRAN) injection 4 mg (4 mg Intravenous Given 07/23/19 [id number])    Note:  This document was prepared using Dragon voice recognition software  and may include unintentional dictation errors.  Nanda Quinton, MD, [facility] Emergency Medicine    Kayron Kalmar, [facility], MD 07/23/19 [phone]

## 2019-07-23 NOTE — ED Notes (Signed)
NT will get EKG when pt returns from CT.

## 2019-07-24 ENCOUNTER — Inpatient Hospital Stay (HOSPITAL_COMMUNITY): Payer: Medicare PPO

## 2019-07-24 DIAGNOSIS — S2241XA Multiple fractures of ribs, right side, initial encounter for closed fracture: Secondary | ICD-10-CM | POA: Diagnosis not present

## 2019-07-24 DIAGNOSIS — S5011XA Contusion of right forearm, initial encounter: Secondary | ICD-10-CM | POA: Diagnosis not present

## 2019-07-24 DIAGNOSIS — N179 Acute kidney failure, unspecified: Secondary | ICD-10-CM

## 2019-07-24 DIAGNOSIS — W1830XA Fall on same level, unspecified, initial encounter: Secondary | ICD-10-CM | POA: Diagnosis not present

## 2019-07-24 LAB — BASIC METABOLIC PANEL
Anion gap: 10 (ref 5–15)
BUN: 23 mg/dL (ref 8–23)
CO2: 24 mmol/L (ref 22–32)
Calcium: 8.3 mg/dL — ABNORMAL LOW (ref 8.9–10.3)
Chloride: 104 mmol/L (ref 98–111)
Creatinine, Ser: 1.28 mg/dL — ABNORMAL HIGH (ref 0.44–1.00)
GFR calc Af Amer: 46 mL/min — ABNORMAL LOW (ref >60)
GFR calc non Af Amer: 39 mL/min — ABNORMAL LOW (ref >60)
Glucose, Bld: 145 mg/dL — ABNORMAL HIGH (ref 70–99)
Potassium: 4.3 mmol/L (ref 3.5–5.1)
Sodium: 138 mmol/L (ref 135–145)

## 2019-07-24 LAB — CBC
HCT: 33.2 % — ABNORMAL LOW (ref 36.0–46.0)
Hemoglobin: 10.3 g/dL — ABNORMAL LOW (ref 12.0–15.0)
MCH: 30.4 pg (ref 26.0–34.0)
MCHC: 31 g/dL (ref 30.0–36.0)
MCV: 97.9 fL (ref 80.0–100.0)
Platelets: 248 10*3/uL (ref 150–400)
RBC: 3.39 MIL/uL — ABNORMAL LOW (ref 3.87–5.11)
RDW: 13.3 % (ref 11.5–15.5)
WBC: 11.1 10*3/uL — ABNORMAL HIGH (ref 4.0–10.5)
nRBC: 0 % (ref 0.0–0.2)

## 2019-07-24 MED ORDER — SODIUM CHLORIDE 0.9 % IV SOLN: INTRAVENOUS | Status: AC

## 2019-07-24 MED ORDER — GUAIFENESIN ER 600 MG PO TB12
600.0000 mg | ORAL_TABLET | Freq: Two times a day (BID) | ORAL | Status: DC | PRN
  Administered 2019-07-24 – 2019-07-25 (×2): 600 mg via ORAL
  Filled 2019-07-24 (×2): qty 1

## 2019-07-24 NOTE — TOC Progression Note (Signed)
Transition of Care [facility]) - Progression Note    Patient Details  Name: [name] MRN: [id number] Date of Birth: 1941-01-11  Transition of Care [facility]) CM/SW [place], [place] Phone Number: 07/24/2019, 2:42 PM  Clinical Narrative:    CSW spoke with pt at bedside. Introduced self, role, reason for visit. Pt from home alone, aware that she is recommended for SNF. Pt son lives in [place].  She is interested and has been before, she requested referral not be sent to SNF she was at before. [facility], [facility]. Explained [facility] usually is full, [facility] and [facility] not taking referrals. CSW has started [place], faxed out pt, [facility] pending.    Expected Discharge Plan: Skilled Nursing Facility Barriers to Discharge: Continued Medical Work up, Ship broker, Environmental education officer)  Expected Discharge Plan and Services Expected Discharge Plan: [place]   Discharge Planning Services: CM Consult Post Acute Care Choice: [place] Living arrangements for the past 2 months: Single Family Home  Readmission Risk Interventions No flowsheet data found.

## 2019-07-24 NOTE — Social Work (Addendum)
To Whom It May Concern:  Please be advised that the above-named patient will require a short-term nursing home stay - anticipated 30 days or less for rehabilitation and strengthening.  The plan is for return home.  

## 2019-07-24 NOTE — Progress Notes (Signed)
  Date: 07/24/2019  Patient name: [name]  Medical record number: [id number]  Date of birth: 08-29-1940   I have seen and evaluated [name] and discussed their care with the Residency Team. Briefly, [name] is an 79 year old woman who presented after a fall with multiple rib fractures and found to have an AKI.   Vitals:   07/24/19 1423 07/24/19 1814  BP:    Pulse:    Resp:    Temp:    SpO2: 97% 96%   General: Lying in bed, NAD Eyes: Anicteric sclerae CV: RR, NR, no murmur Pulm: Course crackles at bases, unable to take a deep breath without pain Abd: Soft, +BS,  Mood: Appears to be in pain.   Assessment and Plan: I have seen and evaluated the patient as outlined above. I agree with the formulated Assessment and Plan as detailed in the residents' note, with the following changes:   1. AKI:  IVF, check renal function after fluids  2. Rib Fractures - IS, pain control  3. Right arm hematoma - Monitor, ensure improvement once bruising heals  Other issues per Dr. Darci Current note.   Sid Falcon, MD 4/22/20218:41 PM

## 2019-07-24 NOTE — Evaluation (Signed)
Occupational Therapy Evaluation Patient Details Name: [name] MRN: [id number] DOB: February 27, 1941 Today's Date: 07/24/2019    History of Present Illness [name] is a 79 y.o f with CAD, hypothyroidism, HTN, afib, back pain, nonfunctioning spinal cord stimulator (removed 04/2019), colostomy, OSA, admitted 07/23/19 with chest pain after having a fall two days ago. Pt sustained multiple R-side rib fxs (4-8), R forearm hematoma.   Clinical Impression   Pt was mod I prior to admission with Rollator and DME, but did have a personal care attendant coming 2x a day to assist with IADL. Today Pt is mod to max A for LB ADL, mod A for in room mobility with close chair follow for safety, she was able to complete grooming with set up in recliner. At this time recommending SNF post-acute stay and continued OT services to maximize safety and independence in ADL and functional transfers. Pts SpO2 ranging from 85-91% during session (improved with mobility and activity) and Pt demonstrating ability to pull 750 on incentive spirometer (completed x10). Next session to focus on continued activity tolerance and transfers for ADL.     Follow Up Recommendations  SNF;Supervision/Assistance - 24 hour    Equipment Recommendations  Other (comment)(defer to next venue of care)    Recommendations for Other Services       Precautions / Restrictions Precautions Precautions: Fall Restrictions Weight Bearing Restrictions: No      Mobility Bed Mobility Overal bed mobility: Needs Assistance Bed Mobility: Supine to Sit     Supine to sit: Mod assist;HOB elevated     General bed mobility comments: ModA to assist trunk elevation and scooting hips to EOB; pt limited by pain  Transfers Overall transfer level: Needs assistance Equipment used: 2 person hand held assist;Rolling walker (2 wheeled) Transfers: Sit to/from Stand Sit to Stand: Min assist         General transfer comment: Stood from EOB with  bilat HHA, reliant on minA for trunk elevation and balance. Able to stand from [facility] to RW with minA; repeated cues for correct hand placement. Stood again with BUE support on RW requiring minA to stabilize RW    Balance Overall balance assessment: Needs assistance   Sitting balance-Leahy Scale: Fair       Standing balance-Leahy Scale: Poor Standing balance comment: Reliant on UE support                           ADL either performed or assessed with clinical judgement   ADL Overall ADL's : Needs assistance/impaired Eating/Feeding: Set up;Sitting   Grooming: Wash/dry hands;Wash/dry face;Oral care;Set up;Sitting Grooming Details (indicate cue type and reason): in recliner Upper Body Bathing: Moderate assistance   Lower Body Bathing: Moderate assistance   Upper Body Dressing : Moderate assistance   Lower Body Dressing: Maximal assistance   Toilet Transfer: Minimal assistance;Ambulation;RW Toilet Transfer Details (indicate cue type and reason): vc for safe hand placement Toileting- Clothing Manipulation and Hygiene: Moderate assistance;Sit to/from stand Toileting - Clothing Manipulation Details (indicate cue type and reason): did not attempt to have Pt change ostomy bag     Functional mobility during ADLs: Minimal assistance;Rolling walker;Cueing for safety General ADL Comments: decreased access to LB for ADL, decreased activity tolerance     Vision         Perception     Praxis      Pertinent Vitals/Pain Pain Assessment: Faces Faces Pain Scale: Hurts even more Pain Location: Rib fxs Pain  Descriptors / Indicators: Grimacing;Guarding;Moaning Pain Intervention(s): Monitored during session;Repositioned;Limited activity within patient's tolerance     Hand Dominance     Extremity/Trunk Assessment Upper Extremity Assessment Upper Extremity Assessment: Generalized weakness(R forearm hematoma; motion limited by painful rib fxs)   Lower Extremity  Assessment Lower Extremity Assessment: Generalized weakness   Cervical / Trunk Assessment Cervical / Trunk Assessment: Other exceptions Cervical / Trunk Exceptions: hx of stenosis   Communication Communication Communication: No difficulties   Cognition Arousal/Alertness: Awake/alert Behavior During Therapy: WFL for tasks assessed/performed Overall Cognitive Status: Within Functional Limits for tasks assessed                                     General Comments  C/o dizziness upon sitting, BP 130/60.SpO2 85-91% on RA, improving with OOB/upright mobility, able to demonstrate use of IS pulling up to 750    Exercises Other Exercises Other Exercises: Good technique with incentive spirometer - pulling ~750 mL   Shoulder Instructions      Home Living Family/patient expects to be discharged to:: Private residence Living Arrangements: Alone Available Help at Discharge: Family;Personal care attendant(PCA comes 2 hours 2x a day) Type of Home: House Home Access: Stairs to enter [employer] of Steps: 2 Entrance Stairs-Rails: None Home Layout: One level(1 step into kitchen/den)     Bathroom Shower/Tub: Teacher, early years/pre: Standard     Home Equipment: Environmental consultant - 4 wheels;Cane - single point;Wheelchair - manual;Hand held shower head;Shower seat   Additional Comments: has a PCA who come a few hours in the morning and evening to cook and to feed dog; Lives with dog who is currently being boarded while admitted. Has ostomy bag. Son lives in [place], unable to stay to assist      Prior Functioning/Environment Level of Independence: Independent with assistive device(s)        Comments: Mod indep with rollator for household and community ambulation; drives; able to get rollator into and out of car. Has hired aide come 2x day to help with cooking/household tasks and feeding dog        OT Problem List: Decreased activity tolerance;Decreased  range of motion;Impaired balance (sitting and/or standing);Decreased safety awareness;Decreased knowledge of use of DME or AE;Decreased knowledge of precautions;Cardiopulmonary status limiting activity;Pain      OT Treatment/Interventions: Self-care/ADL training;Energy conservation;DME and/or AE instruction;Therapeutic activities;Patient/family education;Balance training    OT Goals(Current goals can be found in the care plan section) Acute Rehab OT Goals Patient Stated Goal: Return home to dog OT Goal Formulation: With patient Time For Goal Achievement: 08/07/19 Potential to Achieve Goals: Good ADL Goals Pt Will Perform Grooming: with supervision;standing Pt Will Perform Upper Body Bathing: with supervision;sitting;with adaptive equipment Pt Will Perform Lower Body Bathing: with supervision;with adaptive equipment;sitting/lateral leans Pt Will Transfer to Toilet: with supervision;ambulating Pt Will Perform Toileting - Clothing Manipulation and hygiene: with supervision;sitting/lateral leans Additional ADL Goal #1: Pt will perform bed mobility at supervision level prior to engaging in ADL routine  OT Frequency: Min 2X/week   Barriers to D/C: Decreased caregiver support  Pt lives alone       Co-evaluation PT/OT/SLP Co-Evaluation/Treatment: Yes Reason for Co-Treatment: For patient/therapist safety;To address functional/ADL transfers PT goals addressed during session: Mobility/safety with mobility;Balance;Proper use of DME OT goals addressed during session: ADL's and self-care;Proper use of Adaptive equipment and DME      AM-PAC OT "6 Clicks" Daily Activity  Outcome Measure Help from another person eating meals?: A Little Help from another person taking care of personal grooming?: A Little Help from another person toileting, which includes using toliet, bedpan, or urinal?: A Lot Help from another person bathing (including washing, rinsing, drying)?: A Lot Help from another person  to put on and taking off regular upper body clothing?: A Little Help from another person to put on and taking off regular lower body clothing?: A Lot 6 Click Score: 15   End of Session Equipment Utilized During Treatment: Rolling walker Nurse Communication: Mobility status  Activity Tolerance: Patient tolerated treatment well Patient left: in chair;with call bell/phone within reach;with chair alarm set  OT Visit Diagnosis: Unsteadiness on feet (R26.81);Other abnormalities of gait and mobility (R26.89);History of falling (Z91.81);Muscle weakness (generalized) (M62.81)                Time: [id number] OT Time Calculation (min): 49 min Charges:  OT General Charges $OT Visit: 1 Visit OT Evaluation $OT Eval Moderate Complexity: [place] OTR/L Acute Rehabilitation Services Pager: [phone] Office: [place] 07/24/2019, 2:06 PM

## 2019-07-24 NOTE — Care Management CC44 (Signed)
Condition Code 44 Documentation Completed  Patient Details  Name: [name] MRN: [id number] Date of Birth: Apr 02, 1941   Condition Code 44 given:  Yes Patient signature on Condition Code 44 notice:  Yes Documentation of 2 MD's agreement:  Yes Code 44 added to claim:  Yes    Marilu Favre, RN 07/24/2019, 10:23 AM

## 2019-07-24 NOTE — Plan of Care (Signed)
  Problem: Education: Goal: Knowledge of General Education information will improve Description Including pain rating scale, medication(s)/side effects and non-pharmacologic comfort measures Outcome: Progressing   

## 2019-07-24 NOTE — Progress Notes (Signed)
MD notified of patient's BP 95/37. Patient asymptomatic. MD states they will be up to assess patient shortly.

## 2019-07-24 NOTE — Final Consult Note (Addendum)
Consultant Final Sign-Off Note    Assessment/Final recommendations  [name] is a 79 y.o. female followed by me for:  Fall - Hx of 2 other falls, pt reports losing balance each time R 4-6 anterior rib fractures and 7-8 lateral rib fractures - No PTX on CXR this am - Multimodal pain control - on Tylenol, Norco and Robaxin - Pulmonary toilet, IS, flutter valve - PT/OT - Wean supplemental oxygen R forearm hematoma - wrap with dry gauze, heat/ice prn, no fx on CT  Wound care (if applicable):    Diet at discharge: per primary team   Activity at discharge: PT/OT eval   Follow-up appointment:  PCP   Pending results:  Unresulted Labs (From admission, onward)   None       Medication recommendations:    Other recommendations:    Thank you for allowing [place] to participate in the care of your patient!  Please consult [place] again if you have further needs for your patient.  Barth Kirks [facility] 07/24/2019 9:17 AM    Subjective   Patient reports some pain over her ribs. Tolerating diet without n/v. No abdominal pain. Some air in ostomy bag. Using IS and pulling 500. Used Norco once this am.   Objective  Vital signs in last 24 hours: Temp:  [98.3 F (36.8 C)-99 F (37.2 C)] 98.3 F (36.8 C) (04/22 0600) Pulse Rate:  [57-65] 57 (04/22 0800) Resp:  [15-20] 16 (04/22 0800) BP: (95-136)/(37-100) 95/37 (04/22 0800) SpO2:  [89 %-100 %] 94 % (04/22 0800) Weight:  [96.9 kg] 96.9 kg (04/22 0500)  RN reports she called primary team about BP - started on IVF by primary team  Gen: Awake and alert, NAD Heart: reg rate Lungs: CTA b/l, normal rate and effort. On 2L. Pulling 500 on IS. Chest wall tenderness Abd: Soft, ND, NT, +BS. Colostomy bag with air present. No stool.  Msk: Moves all extremities without reported pain   Pertinent labs and Studies: Recent Labs    07/21/19 1440 07/21/19 1440 07/21/19 1512 07/23/19 0646 07/24/19 0212  WBC 14.3*  --   --  11.4* 11.1*  HGB  12.0   < > 13.3 11.1* 10.3*  HCT 37.4   < > 39.0 36.1 33.2*   < > = values in this interval not displayed.   BMET Recent Labs    07/23/19 0646 07/24/19 0212  NA 138 138  K 4.5 4.3  CL 102 104  CO2 27 24  GLUCOSE 163* 145*  BUN 18 23  CREATININE 1.10* 1.28*  CALCIUM 8.5* 8.3*   No results for input(s): LABURIN in the last 72 hours. Results for orders placed or performed during the hospital encounter of 07/23/19  SARS CORONAVIRUS 2 (TAT 6-24 HRS) Nasopharyngeal Nasopharyngeal Swab     Status: None   Collection Time: 07/23/19  9:08 AM   Specimen: Nasopharyngeal Swab  Result Value Ref Range Status   SARS Coronavirus 2 NEGATIVE NEGATIVE Final    Comment: (NOTE) SARS-CoV-2 target nucleic acids are NOT DETECTED. The SARS-CoV-2 RNA is generally detectable in upper and lower respiratory specimens during the acute phase of infection. Negative results do not preclude SARS-CoV-2 infection, do not rule out co-infections with other pathogens, and should not be used as the sole basis for treatment or other patient management decisions. Negative results must be combined with clinical observations, patient history, and epidemiological information. The expected result is Negative. Fact Sheet for Patients: SugarRoll.be Fact Sheet for Healthcare Providers: https://www.woods-mathews.com/ This  test is not yet approved or cleared by the [place] and  has been authorized for detection and/or diagnosis of SARS-CoV-2 by FDA under an Emergency Use Authorization (EUA). This EUA will remain  in effect (meaning this test can be used) for the duration of the COVID-19 declaration under Section 56 4(b)(1) of the Act, 21 U.S.C. section 360bbb-3(b)(1), unless the authorization is terminated or revoked sooner. Performed at [facility], [place] [address] [place], [place] [address]     Imaging: CT FOREARM RIGHT WO CONTRAST  Result Date:  07/23/2019 CLINICAL DATA:  Bruising and swelling of the anterior forearm. No recalled acute injury. EXAM: CT OF THE RIGHT FOREARM WITHOUT CONTRAST TECHNIQUE: Multidetector CT imaging was performed according to the standard protocol. Multiplanar CT image reconstructions were also generated. COMPARISON:  None. FINDINGS: Bones/Joint/Cartilage No evidence of acute fracture, dislocation or bone destruction. Mild degenerative changes within the wrist. Tiny ossific density dorsal to the lunate, best seen on the sagittal images, probably not acute. No significant elbow joint effusion. Ligaments Suboptimally assessed by CT. Muscles and Tendons No gross muscle or tendon abnormality. Soft tissues In the subcutaneous fat posterior to the mid ulna, there is a hyperdense mass measuring 3.7 x 2.7 x 1.6 cm, presumably a hematoma. There is surrounding soft tissue swelling. No evidence of foreign body or soft tissue emphysema. IMPRESSION: 1. Hyperdense mass posterior to the mid ulna, presumably a hematoma. No evidence of foreign body or soft tissue emphysema. Clinical follow-up recommended to ensure resolution and exclude mass lesion. 2. No evidence of acute fracture, dislocation or bone destruction. Electronically Signed   By: Richardean Sale M.D.   On: 07/23/2019 19:18   DG CHEST PORT 1 VIEW  Result Date: 07/24/2019 CLINICAL DATA:  Multiple rib fractures. EXAM: PORTABLE CHEST 1 VIEW COMPARISON:  CT 07/23/2019.  Chest x-ray 07/23/2019. FINDINGS: Prior CABG. Cardiomegaly. Mild left base atelectasis/infiltrate. Tiny left pleural effusion. No pneumothorax. Right rib fractures best identified by prior CT. IMPRESSION: 1.  Prior CABG.  Stable cardiomegaly. 2. Mild left base atelectasis/infiltrate. Tiny left pleural effusion. 3. Right rib fractures best identified by prior CT. No pneumothorax. Electronically Signed   By: Marcello Moores  Register   On: 07/24/2019 05:49

## 2019-07-24 NOTE — Progress Notes (Signed)
Subjective:  Patient seen at bedside. Reports continued pain from ribs, improved with pain medication.  Objective:    Vital Signs (last 24 hours): Vitals:   07/24/19 0600 07/24/19 0748 07/24/19 0800 07/24/19 1030  BP: (!) 103/50  (!) 95/37   Pulse: (!) 58  (!) 57   Resp: 16  16   Temp: 98.3 F (36.8 C)     TempSrc: Tympanic     SpO2: 95% 95% 94% 94%  Weight:      Height:        Physical Exam: General Alert and answers questions appropriately, no acute distress  Cardiac Regular rate and rhythm, no murmurs, rubs, or gallops  Pulmonary Clear to auscultation bilaterally without wheezes, rhonchi, or rales   DG Chest Port 1 View (07/24/19): IMPRESSION: 1.  Prior CABG.  Stable cardiomegaly. 2. Mild left base atelectasis/infiltrate. Tiny left pleural effusion. 3. Right rib fractures best identified by prior CT. No pneumothorax.  DG Chest 2 View (07/23/19): IMPRESSION: Chronic lung changes but no acute pulmonary findings.  CT Chest WO Contrast (07/23/19): FINDINGS: Cardiovascular: Cardiomegaly. No pericardial effusion. Thoracic aorta is nonaneurysmal. Atherosclerotic calcifications of the aorta and coronary arteries. Prior CABG.  Mediastinum/Nodes: No enlarged mediastinal or axillary lymph nodes. Thyroid gland, trachea, and esophagus demonstrate no significant findings.  Lungs/Pleura: Mild bibasilar subsegmental atelectasis. No new or enlarging pulmonary nodules. No focal airspace consolidation, pleural effusion, or pneumothorax.  Upper Abdomen: No acute abnormality.  Musculoskeletal: Acute nondisplaced fractures of the anterior aspects of the right fourth, fifth, and sixth ribs as well as the lateral aspect of the right seventh and eighth ribs. Thoracic vertebral body heights and alignment are maintained. Prior median sternotomy.  IMPRESSION: 1. Acute nondisplaced fractures of the anterior aspects of the right fourth, fifth, and sixth ribs as well as the  lateral aspect of the right seventh and eighth ribs. No pneumothorax. 2. Cardiomegaly. 3. Aortic and coronary artery atherosclerosis. (ICD10-I70.0)  CT Forearm Right WO Contrast (07/23/19): IMPRESSION: 1. Hyperdense mass posterior to the mid ulna, presumably a hematoma. No evidence of foreign body or soft tissue emphysema. Clinical follow-up recommended to ensure resolution and exclude mass lesion. 2. No evidence of acute fracture, dislocation or bone destruction.   Assessment/Plan:   Principal Problem:   Acute kidney injury [facility]) Active Problems:   Rib fracture  Patient is an 79 year old female with past medical history significant for coronary artery disease, hypothyroidism, hypertension, addison's disease, atrial fibrillation, colostomy, OSA who presented with chest pain after a fall two days prior.  # Acute kidney injury: Creatinine of 1.28 this AM from 0.70 on 07/21/19. Likely prerenal in setting of decreased PO intake. Will place patient on mIVF NS 100 cc/hr *Check BMP in AM  # Rib fracture Patient with acute nondisplaced anterior rib fracture on right 4-6 anterior rib and 7-8 lateral rib fractures were noted on prior imaging 3 days ago.  Surgery consulted, recommend repeat chest x-ray this morning (4/22), pain control, and observation. CXR this AM without significant change from prior. *Incentive spirometry *Pain control: Morphine 2-4 mg Q3HR PRN + Norco 5-325 mg Q6HR PRN. Received morphine 4mg  and Norco x1 yesterday *PT/OT consulted *Check CBC in AM for signs of bleeding  # Right forearm hematoma Patient with significant bruising on the right forearm.  Raised 3-4 cm area. No tenderness on palpation. CT forearm was obtained for further evaluation and revealed no bony abnormality but demonstrated a 3.7x2.7x1.6 cm hematoma. *Continue observation  # Chronic back pain CT cervical spine  from 07/21/2019 demonstrated mild degenerative changes, no traumatic injury.  CT lumbar spine  on 02/10/2019 demonstrated disc protrusions, subarticular narrowing, stenosis. *Patient will need followup with neurosurgery as outpatient *PT and OT *Continue fluoxetine 10 mg daily  # Persistent atrial fibrillation Patient's pulse has ranged 50-60s and she is hemodynamically stable.  *Continue home amiodarone 200 mg daily + diltiazem 180 mg daily  # Shortness of breath: Echo performed 09/20/2015 revealed EF 65-70%, moderate LVH. Per cardiology note in February 2021, concern for possible amiodarone associated lung injury and lasix increase to 80mg  AM and 40 mg PM  # Hypothyroidism: Continue home levothyroxine 88 mcg daily  # Addison's disease: Continue home hydrocortisone 20 mg twice daily  # CAD status post CABG: Continue aspirin 81 mg daily  PT/OT: Consulted Diet: Heart DVT Ppx: On Eliquis 5 mg daily Admit Status: Inpatient Dispo: Anticipated discharge in approximately 1-2 days  Jeanmarie Hubert, MD 07/24/2019, 10:45 AM

## 2019-07-24 NOTE — Evaluation (Addendum)
Physical Therapy Evaluation Patient Details Name: [name] MRN: [id number] DOB: 1941-02-08 Today's Date: 07/24/2019   History of Present Illness  Ms. Kinslow is a 79 y.o f with CAD, hypothyroidism, HTN, afib, back pain, nonfunctioning spinal cord stimulator (removed 04/2019), colostomy, OSA, admitted 07/23/19 with chest pain after having a fall two days ago. Pt sustained multiple R-side rib fxs (4-8), R forearm hematoma.    Clinical Impression  Pt presents with an overall decrease in functional mobility secondary to above. PTA, pt mod indep with rollator, lives alone and drives; currently working with OP PT for chronic back pain. Today, pt required up to [facility] for limited mobility; limited by generalized weakness, pain, and decreased activity tolerance. Pt would benefit from continued acute PT services to maximize functional mobility and independence prior to d/c with SNF-level therapies.     Follow Up Recommendations SNF;Supervision for mobility/OOB    Equipment Recommendations  (TBD)    Recommendations for Other Services       Precautions / Restrictions Precautions Precautions: Fall Restrictions Weight Bearing Restrictions: No      Mobility  Bed Mobility Overal bed mobility: Needs Assistance Bed Mobility: Supine to Sit     Supine to sit: Mod assist;HOB elevated     General bed mobility comments: ModA to assist trunk elevation and scooting hips to EOB; pt limited by pain  Transfers Overall transfer level: Needs assistance Equipment used: 2 person hand held assist;Rolling walker (2 wheeled) Transfers: Sit to/from Stand Sit to Stand: Min assist         General transfer comment: Stood from EOB with bilat HHA, reliant on minA for trunk elevation and balance. Able to stand from [facility] to RW with minA; repeated cues for correct hand placement. Stood again with BUE support on RW requiring minA to stabilize RW  Ambulation/Gait Ambulation/Gait assistance: Min guard;Min  Web designer (Feet): 16 Feet Assistive device: Rolling walker (2 wheeled);1 person hand held assist Gait Pattern/deviations: Step-through pattern;Decreased stride length;Antalgic;Trunk flexed   Gait velocity interpretation: <1.31 ft/sec, indicative of household ambulator General Gait Details: Slow, unsteady, painful gait with RW and close min guard for balance; reliant on HHA and minA for trial withotu DME  Stairs            Wheelchair Mobility    Modified Rankin (Stroke Patients Only)       Balance Overall balance assessment: Needs assistance   Sitting balance-Leahy Scale: Fair       Standing balance-Leahy Scale: Poor Standing balance comment: Reliant on UE support                             Pertinent Vitals/Pain Pain Assessment: Faces Faces Pain Scale: Hurts even more Pain Location: Rib fxs Pain Descriptors / Indicators: Grimacing;Guarding;Moaning Pain Intervention(s): Monitored during session;Limited activity within patient's tolerance    Home Living Family/patient expects to be discharged to:: Private residence Living Arrangements: Alone   Type of Home: House Home Access: Stairs to enter Entrance Stairs-Rails: None Entrance Stairs-Number of Steps: 2 Home Layout: One level(1 step into kitchen/den) Home Equipment: Walker - 4 wheels;Cane - single point;Wheelchair - manual;Hand held shower head;Shower seat Additional Comments: Lives with dog who is currently being boarded while admitted. Has ostomy bag. Son lives in [place], unable to stay to assist    Prior Function Level of Independence: Independent with assistive device(s)         Comments: Mod indep with rollator for household  and community ambulation; drives; able to get rollator into and out of car. Has hired aide come 2x day to help with cooking/household tasks and feeding dog     Hand Dominance        Extremity/Trunk Assessment   Upper Extremity Assessment Upper  Extremity Assessment: Generalized weakness(R forearm hematoma; motion limited by painful rib fxs)    Lower Extremity Assessment Lower Extremity Assessment: Generalized weakness       Communication   Communication: No difficulties  Cognition Arousal/Alertness: Awake/alert Behavior During Therapy: WFL for tasks assessed/performed Overall Cognitive Status: Within Functional Limits for tasks assessed                                        General Comments General comments (skin integrity, edema, etc.): C/o dizziness upon sitting, BP 130/60.SpO2 85-91% on RA, improving with OOB/upright mobility    Exercises Other Exercises Other Exercises: Good technique with incentive spirometer - pulling ~750 mL   Assessment/Plan    PT Assessment Patient needs continued PT services  PT Problem List Decreased strength;Decreased range of motion;Decreased activity tolerance;Decreased balance;Decreased mobility;Decreased knowledge of use of DME;Pain       PT Treatment Interventions DME instruction;Gait training;Stair training;Functional mobility training;Therapeutic activities;Therapeutic exercise;Balance training;Patient/family education    PT Goals (Current goals can be found in the Care Plan section)  Acute Rehab PT Goals Patient Stated Goal: Return home to dog PT Goal Formulation: With patient Time For Goal Achievement: 08/07/19 Potential to Achieve Goals: Good    Frequency Min 3X/week   Barriers to discharge Decreased caregiver support      Co-evaluation PT/OT/SLP Co-Evaluation/Treatment: Yes Reason for Co-Treatment: For patient/therapist safety;To address functional/ADL transfers           AM-PAC PT "6 Clicks" Mobility  Outcome Measure Help needed turning from your back to your side while in a flat bed without using bedrails?: A Lot Help needed moving from lying on your back to sitting on the side of a flat bed without using bedrails?: A Lot Help needed moving  to and from a bed to a chair (including a wheelchair)?: A Little Help needed standing up from a chair using your arms (e.g., wheelchair or bedside chair)?: A Little Help needed to walk in hospital room?: A Little Help needed climbing 3-5 steps with a railing? : A Lot 6 Click Score: 15    End of Session   Activity Tolerance: Patient tolerated treatment well;Patient limited by pain Patient left: in chair;with call bell/phone within reach;with chair alarm set Nurse Communication: Mobility status PT Visit Diagnosis: Other abnormalities of gait and mobility (R26.89);Muscle weakness (generalized) (M62.81);Pain Pain - Right/Left: Right Pain - part of body: Arm(abdomen)    Time: 1049-1130 PT Time Calculation (min) (ACUTE ONLY): 41 min   Charges:   PT Evaluation $PT Eval Moderate Complexity: 1 Mod PT Treatments $Therapeutic Activity: 8-22 mins   Mabeline Caras, PT, DPT Acute Rehabilitation Services  Pager [phone] Office [phone]  Derry Lory 07/24/2019, 11:32 AM

## 2019-07-24 NOTE — TOC Initial Note (Signed)
Transition of Care [facility]) - Initial/Assessment Note    Patient Details  Name: [name] MRN: [id number] Date of Birth: [birth year]  Transition of Care [facility]) CM/SW Contact:    Marilu Favre, RN Phone Number: 07/24/2019, 11:22 AM  Clinical Narrative:                  Spoke to patient at bedside. Patient from home alone, confirmed information on face sheet. Patient has a PCP Dr Crist Infante .  Patient states she has no family but she has friends close by. She has a Corporate investment banker at home.   Will revisit after PT eval, patient voiced understanding   Barriers to Discharge: Continued Medical Work up   Patient Goals and CMS Choice Patient states their goals for this hospitalization and ongoing recovery are:: to go home CMS Medicare.gov Compare Post Acute Care list provided to:: Patient    Expected Discharge Plan and Services     Discharge Planning Services: CM Consult   Living arrangements for the past 2 months: Single Family Home                                      Prior Living Arrangements/Services Living arrangements for the past 2 months: Single Family Home Lives with:: Self              Current home services: DME(Rolator) Criminal Activity/Legal Involvement Pertinent to Current Situation/Hospitalization: No - Comment as needed  Activities of Daily Living      Permission Sought/Granted   Permission granted to share information with : No              Emotional Assessment Appearance:: Appears stated age Attitude/Demeanor/Rapport: Guarded Affect (typically observed): Adaptable Orientation: : Oriented to Self, Oriented to Place, Oriented to  Time, Oriented to Situation Alcohol / Substance Use: Not Applicable Psych Involvement: No (comment)  Admission diagnosis:  Hypoxemia [R09.02] Rib fracture [S22.39XA] Closed fracture of multiple ribs of right side, initial encounter [S22.41XA] Fall from ground level [W18.30XA] Multiple fractures of ribs,  right side, initial encounter for closed fracture [S22.41XA] Patient Active Problem List   Diagnosis Date Noted  . Acute kidney injury ([place]) 07/24/2019  . Rib fracture 07/23/2019  . Class 2 drug-induced obesity with serious comorbidity and body mass index (BMI) of 36.0 to 36.9 in adult 02/19/2019  . S/P insertion of spinal cord stimulator 01/09/2019  . Spondylosis without myelopathy or radiculopathy, lumbar region 01/09/2019  . 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 Coronary artery disease 10/17/2018  . Mesenteric mass (lipoma) s/p excision 03/20/2018 03/25/2018  . Diverticulitis of sigmoid colon 03/25/2018  . S/P laparoscopic-assisted sigmoidectomy 03/20/2018  . Colonic mass 03/20/2018  . History of adenomatous polyp of colon   . Polyp of ascending colon   . 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 Orthostatic hypotension 09/24/2017  . Visit for monitoring Tikosyn therapy 07/23/2017  . Depression 06/15/2017  . Acute respiratory failure with hypoxia ([place])   . Hypothyroidism   . Bronchitis 03/22/2016  . Abnormal CT of the chest 03/16/2016  . 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 Chronic anticoagulation 10/06/2015  . On dofetilide therapy 10/06/2015  . Atrial fibrillation ([place]) 09/15/2015  . Snoring 02/17/2015  . Chronic low back pain 11/09/2014  . Disc degeneration, lumbar 11/09/2014  . Addison disease ([place]) 09/30/2014  .  Chronic back pain 09/30/2014  . Hematochezia-(Hgb stable) 09/03/2014  . DOE (dyspnea on exertion) 09/01/2014  . Lumbar radiculopathy 04/27/2014  . Osteoarthritis 11/18/2008  . Hyperlipidemia 11/17/2008  . Essential hypertension 11/17/2008   PCP:  Patient, No Pcp Per Pharmacy:   [facility], [place] - [address] [place] [address] Phone: [phone] Fax: [phone]  [place], [place] [address] [place] [address] Phone: [phone] Fax: [phone]     Social Determinants of Health (SDOH) Interventions    Readmission Risk Interventions No flowsheet data found.

## 2019-07-24 NOTE — Progress Notes (Signed)
   07/23/19 2319  Vitals  Temp 99 F (37.2 C)  Temp Source Oral  BP (!) 136/58  MAP (mmHg) 78  BP Location Left Arm  BP Method Automatic  Patient Position (if appropriate) Lying  Pulse Rate 64  Pulse Rate Source Monitor  Resp 17  Level of Consciousness  Level of Consciousness Alert  Oxygen Therapy  SpO2 97 %  O2 Device Nasal Cannula  O2 Flow Rate (L/min) 2 L/min  Patient Activity (if Appropriate) In bed  Pain Assessment  Pain Scale 0-10  Pain Score 6  Pain Type Acute pain  Pain Location Rib cage  Pain Orientation Right;Left  Pain Descriptors / Indicators Aching  Pain Frequency Constant  Pain Onset Gradual  Patients Stated Pain Goal 2  Pain Intervention(s) Medication (See eMAR);Repositioned;Emotional support  PCA/Epidural/Spinal Assessment  Respiratory Pattern Regular;Unlabored  Glasgow Coma Scale  Eye Opening 4  Best Verbal Response (NON-intubated) 5  Best Motor Response 6  Glasgow Coma Scale Score 15  MEWS Score  MEWS Temp 0  MEWS Systolic 0  MEWS Pulse 0  MEWS RR 0  MEWS LOC 0  MEWS Score 0  MEWS Score Color Green  Received patient from ED, alert orientedx3 with vital signs shown above. Patient oriented to room and bed controls. Patient lying comfortably in bed with call bell at reach, bed alarm on and floor mats in place. Will continue to monitor with remainder of shift.

## 2019-07-24 NOTE — Discharge Summary (Addendum)
Name: [name] MRN: [id number] DOB: 03-22-1941 79 y.o. PCP: Abigail Infante, MD  Date of Admission: 07/23/2019  6:28 AM Date of Discharge: 07/29/19 Attending Physician: Abigail Chiquito, MD FACP  Discharge Diagnosis: 1. Rib Fracture due to fall 2. R Arm hematoma  Discharge Medications: Allergies as of 07/29/2019      Reactions   Oxycodone Other (See Comments)   Hallucinations    Oxycodone Other (See Comments)   Halluciations   Sulfa Antibiotics Itching   Tramadol Other (See Comments)   Hallucinations   Tramadol Other (See Comments)   Halluciations   Sulfa Antibiotics Itching   Vaginal itching   Sulfacetamide Sodium Itching   Vaginal itching      Medication List    TAKE these medications   acetaminophen 500 MG tablet Commonly known as: TYLENOL Take 500 mg by mouth every 6 (six) hours as needed for mild pain or headache.   amiodarone 200 MG tablet Commonly known as: PACERONE Take 1 tablet (200 mg total) by mouth daily.   aspirin EC 81 MG tablet Take 81 mg by mouth every Monday, Wednesday, and Friday. In the morning.   clonazePAM 0.5 MG tablet Commonly known as: KLONOPIN Take 0.25-0.5 mg by mouth 2 (two) times daily as needed for anxiety.   Cyanocobalamin 1000 MCG/ML Kit Inject 1,000 mcg into the muscle every 30 (thirty) days.   diazepam 5 MG tablet Commonly known as: VALIUM Take 2.5 mg by mouth See admin instructions. Take 2.5 mg by mouth one to two times Scott day as needed for anxiety   diltiazem 180 MG 24 hr capsule Commonly known as: CARDIZEM CD Take 1 capsule (180 mg total) by mouth daily.   Eliquis 5 MG Tabs tablet Generic drug: apixaban TAKE ONE TABLET TWICE DAILY What changed: how much to take   Evolocumab 140 MG/ML Soaj Inject 140 mg into the skin every 14 (fourteen) days. What changed: additional instructions   Repatha SureClick 494 MG/ML Soaj Generic drug: Evolocumab Inject 1 Syringe into the skin every 14 (fourteen) days. What changed:  Another medication with the same name was changed. Make sure you understand how and when to take each.   fentaNYL 25 MCG/HR Commonly known as: [place] 1 patch onto the skin daily.   FLUoxetine 10 MG capsule Commonly known as: PROZAC Take 10 mg by mouth in the morning.   furosemide 40 MG tablet Commonly known as: LASIX Take 2 tablets by mouth in the morning.  Take 1 tablet by mouth after lunch. What changed:   how much to take  how to take this  when to take this   HYDROcodone-acetaminophen 5-325 MG tablet Commonly known as: NORCO/VICODIN Take 1 tablet by mouth every 6 (six) hours as needed for up to 5 days for moderate pain or severe pain. What changed:   how much to take  reasons to take this   hydrocortisone 20 MG tablet Commonly known as: CORTEF Take 20 mg by mouth in the morning and at bedtime.   levothyroxine 88 MCG tablet Commonly known as: SYNTHROID Take 1 tablet (88 mcg total) by mouth daily before breakfast.   magnesium oxide 400 MG tablet Commonly known as: MAG-OX Take 1 tablet (400 mg total) by mouth daily.   metaxalone 800 MG tablet Commonly known as: SKELAXIN Take 1 tablet (800 mg total) by mouth 3 (three) times daily.   Myrbetriq 50 MG Tb24 tablet Generic drug: mirabegron ER Take 50 mg by mouth daily.   nebivolol  2.5 MG tablet Commonly known as: BYSTOLIC Take 2.5 mg by mouth daily.   Nitrostat 0.4 MG SL tablet Generic drug: nitroGLYCERIN ONE TABLET UNDER TONGUE AS NEEDED FOR CHEST PAIN AS DIRECTED What changed: See the new instructions.   omeprazole 20 MG capsule Commonly known as: PRILOSEC Take 20 mg by mouth daily before breakfast.   polyethylene glycol 17 g packet Commonly known as: MiraLax Take 17 g by mouth daily as needed. May increase daily by 1 dose up to 4 times daily to achieve bowel movement   potassium chloride 10 MEQ tablet Commonly known as: KLOR-CON Take 4 tablets (40 mEq total) by mouth 2 (two) times daily.     psyllium 95 % Pack Commonly known as: HYDROCIL/METAMUCIL Take 1 packet by mouth as needed for mild constipation (May take partial dose if patient prefers).   rOPINIRole 0.25 MG tablet Commonly known as: REQUIP Take 0.5 mg by mouth at bedtime.   valsartan 80 MG tablet Commonly known as: DIOVAN TAKE 1/2 TABLET EVERY MORNING AND TAKE ADDITIONAL ONE-HALF TABLET IN THE AFTERNOON FOR BLOOD PRESSURE > 140/80 What changed: See the new instructions.   valsartan 80 MG tablet Commonly known as: DIOVAN Take 80 mg by mouth at bedtime. What changed: Another medication with the same name was changed. Make sure you understand how and when to take each.   Vitamin D (Ergocalciferol) 1.25 MG (50000 UNIT) Caps capsule Commonly known as: DRISDOL Take 50,000 Units by mouth 2 (two) times Scott week. Takes on Tuesdays and Fridays            Durable Medical Equipment  (From admission, onward)         Start     Ordered   07/29/19 1120  DME Oxygen  Once    Question Answer Comment  Length of Need 6 Months   Mode or (Route) Nasal cannula   Liters per Minute 2   Oxygen delivery system Gas      07/29/19 1129          Disposition and follow-up:   Ms.[name] was discharged from [facility] in [place] condition.  At the hospital follow up visit please address:  1. Rib Fracture due to fall - Assess for her pain control and up-titrate pain med as needed - If endorsing respiratory distress, get stat chest x-ray to r/o hemothorax or pneumothorax  2. Urinary retention -please evaluate patient for urinary retention, check bladder scan  3. R Arm hematoma - Check that her hematoma is decreasing in size - Check cbc  2.  Labs / imaging needed at time of follow-up: cbc, chest x-ray if concerning for complication.  3.  Pending labs/ test needing follow-up: N/Scott  Follow-up Appointments:  Contact information for follow-up providers    Abigail Crome, MD. Call.   Specialty:  Cardiology Contact information: [address] [place] [address] [phone]        Abigail Lesch A, DO. Call.   Specialty: Bariatrics Contact information: [phone] [place] [address] [phone]            Contact information for after-discharge care    Destination    [facility] Preferred SNF .   Service: Skilled Nursing Contact information: [address] [place] [facility] Course by problem list: # Rib Fracture due to  fall:  Ms. Plouff is Scott  79 y.o female with CAD, hypothyroidism, hypertension, addison's disease, atrial fibrillation, colostomy, OSA who presented to [facility] after Scott fall with chief complaint of chest pain. She underwent CT chest which showed multiple broken ribs. She was started on pain meds and supplemental oxygen. On day of discharge, she was saturating well on room air and had adequate pain control with oral regimen. She was discharged with recommendation to followup with her PCP.  # R Arm hematoma:  Patient had an approximately right arm hematoma that formed after fall. CT of the right forearm demonstrated 3.7 x 2.7 x 1.6 cm. Outpatient follow-up is recommended to ensure resolution.  # Urinary retention: Patient with urinary retention during hospitalization requiring occasional in and out catheterization. Recommend continued Q6HR bladder scans post-void at rehabilitation facility with in and out catheterization for volume > 300 ml  Discharge Vitals:   BP 124/63 (BP Location: Left Arm)   Pulse 63   Temp (!) 97.5 F (36.4 C) (Oral)   Resp 18   Ht _0  (1.626 m)   Wt 119.7 kg   SpO2 96%   BMI 45.32 kg/m   Pertinent Labs, Studies, and Procedures:   CT CHEST WITHOUT CONTRAST  TECHNIQUE: Multidetector CT imaging of the chest was performed following the standard protocol without IV contrast.   COMPARISON:  X-ray 07/23/2019,  CT 07/21/2019 05/05/2015   FINDINGS: Cardiovascular: Cardiomegaly. No pericardial effusion. Thoracic aorta is nonaneurysmal. Atherosclerotic calcifications of the aorta and coronary arteries. Prior CABG.   Mediastinum/Nodes: No enlarged mediastinal or axillary lymph nodes. Thyroid gland, trachea, and esophagus demonstrate no significant findings.   Lungs/Pleura: Mild bibasilar subsegmental atelectasis. No new or enlarging pulmonary nodules. No focal airspace consolidation, pleural effusion, or pneumothorax.   Upper Abdomen: No acute abnormality.   Musculoskeletal: Acute nondisplaced fractures of the anterior aspects of the right fourth, fifth, and sixth ribs as well as the lateral aspect of the right seventh and eighth ribs. Thoracic vertebral body heights and alignment are maintained. Prior median sternotomy.   IMPRESSION: 1. Acute nondisplaced fractures of the anterior aspects of the right fourth, fifth, and sixth ribs as well as the lateral aspect of the right seventh and eighth ribs. No pneumothorax. 2. Cardiomegaly. 3. Aortic and coronary artery atherosclerosis. (ICD10-I70.0)  CT OF THE RIGHT FOREARM WITHOUT CONTRAST  TECHNIQUE: Multidetector CT imaging was performed according to the standard protocol. Multiplanar CT image reconstructions were also generated.   COMPARISON:  None.   FINDINGS: Bones/Joint/Cartilage   No evidence of acute fracture, dislocation or bone destruction. Mild degenerative changes within the wrist. Tiny ossific density dorsal to the lunate, best seen on the sagittal images, probably not acute. No significant elbow joint effusion.   Ligaments   Suboptimally assessed by CT.   Muscles and Tendons   No gross muscle or tendon abnormality.   Soft tissues   In the subcutaneous fat posterior to the mid ulna, there is Scott hyperdense mass measuring 3.7 x 2.7 x 1.6 cm, presumably Scott hematoma. There is surrounding soft tissue swelling. No evidence  of foreign body or soft tissue emphysema.   IMPRESSION: 1. Hyperdense mass posterior to the mid ulna, presumably Scott hematoma. No evidence of foreign body or soft tissue emphysema. Clinical follow-up recommended to ensure resolution and exclude mass lesion. 2. No evidence of acute fracture, dislocation or bone destruction.  CBC Latest Ref Rng & Units 07/25/2019 07/24/2019 07/23/2019  WBC 4.0 - 10.5 K/uL 7.8 11.1(H) 11.4(H)  Hemoglobin  12.0 - 15.0 g/dL 10.2(L) 10.3(L) 11.1(L)  Hematocrit 36.0 - 46.0 % 31.9(L) 33.2(L) 36.1  Platelets 150 - 400 K/uL 214 248 269   Discharge Instructions: Discharge Instructions    Call MD for:  difficulty breathing, headache or visual disturbances   Complete by: As directed    Call MD for:  persistant dizziness or light-headedness   Complete by: As directed    Call MD for:  persistant nausea and vomiting   Complete by: As directed    Call MD for:  severe uncontrolled pain   Complete by: As directed    Call MD for:  temperature >100.4   Complete by: As directed    Diet - low sodium heart healthy   Complete by: As directed    Discharge instructions   Complete by: As directed    Dear [name]  You came to [place] with chest pain. We have determined this was caused by broken ribs. Here are our recommendations for you at discharge:  Please take Norco 1 tablet every 4 hours as needed for pain F/u with your primary care provider for pain control  Thank you for choosing [facility]   Increase activity slowly   Complete by: As directed       Signed: Jeanmarie Hubert, MD 07/29/2019, 11:35 AM

## 2019-07-24 NOTE — Care Management Obs Status (Signed)
[place] NOTIFICATION   Patient Details  Name: [name] MRN: [id number] Date of Birth: July 10, 1940   Medicare Observation Status Notification Given:  Yes    Marilu Favre, RN 07/24/2019, 10:21 AM

## 2019-07-24 NOTE — NC FL2 (Signed)
[place] LEVEL OF CARE SCREENING TOOL     IDENTIFICATION  Patient Name: [name] Birthdate: [birth year] Sex: female Admission Date (Current Location): 07/23/2019  [facility] and [place] Number:  Herbalist and Address:  [facility], [place] [address] [place], [place] [address]      Provider Number: [id number]  Attending Physician Name and Address:  Sid Falcon, MD  Relative Name and Phone Number:       Current Level of Care: Hospital Recommended Level of Care: [place] Prior Approval Number:    Date Approved/Denied:   PASRR Number: pending  Discharge Plan: SNF    Current Diagnoses: Patient Active Problem List   Diagnosis Date Noted  . Acute kidney injury ([place]) 07/24/2019  . Rib fracture 07/23/2019  . Class 2 drug-induced obesity with serious comorbidity and body mass index (BMI) of 36.0 to 36.9 in adult 02/19/2019  . S/P insertion of spinal cord stimulator 01/09/2019  . Spondylosis without myelopathy or radiculopathy, lumbar region 01/09/2019  . 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 Coronary artery disease 10/17/2018  . Mesenteric mass (lipoma) s/p excision 03/20/2018 03/25/2018  . Diverticulitis of sigmoid colon 03/25/2018  . S/P laparoscopic-assisted sigmoidectomy 03/20/2018  . Colonic mass 03/20/2018  . History of adenomatous polyp of colon   . Polyp of ascending colon   . 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 Orthostatic hypotension 09/24/2017  . Visit for monitoring Tikosyn therapy 07/23/2017  . Depression 06/15/2017  . Acute respiratory failure with hypoxia ([place])   . Hypothyroidism   . Bronchitis 03/22/2016  . Abnormal CT of the chest 03/16/2016  . 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 Chronic anticoagulation 10/06/2015  . On dofetilide therapy 10/06/2015  . Atrial  fibrillation ([place]) 09/15/2015  . Snoring 02/17/2015  . Chronic low back pain 11/09/2014  . Disc degeneration, lumbar 11/09/2014  . Addison disease ([place]) 09/30/2014  . Chronic back pain 09/30/2014  . Hematochezia-(Hgb stable) 09/03/2014  . DOE (dyspnea on exertion) 09/01/2014  . Lumbar radiculopathy 04/27/2014  . Osteoarthritis 11/18/2008  . Hyperlipidemia 11/17/2008  . Essential hypertension 11/17/2008    Orientation RESPIRATION BLADDER Height & Weight     Time, Self, Situation, Place  Normal Continent Weight: 213 lb 10 oz (96.9 kg) Height:  5\' 4"  (162.6 cm)  BEHAVIORAL SYMPTOMS/MOOD NEUROLOGICAL BOWEL NUTRITION STATUS      Continent, Colostomy(pt has had this prior to admission) Diet(see discharge summary)  AMBULATORY STATUS COMMUNICATION OF NEEDS Skin   Extensive Assist Verbally Bruising, Other (Comment)(generalized ecchymosis)                       Personal Care Assistance Level of Assistance  Bathing, Feeding, Dressing Bathing Assistance: Maximum assistance Feeding assistance: Independent Dressing Assistance: Maximum assistance     Functional Limitations Info  Sight, Hearing, Speech Sight Info: Adequate Hearing Info: Adequate Speech Info: Adequate    SPECIAL CARE FACTORS FREQUENCY  PT (By licensed PT), OT (By licensed OT)     PT Frequency: 5x week OT Frequency: 5x week            Contractures Contractures Info: Not present    Additional Factors Info  Code Status, Allergies, Psychotropic Code Status Info: Full Code Allergies Info: Oxycodone, Oxycodone, Sulfa Antibiotics, Tramadol, Tramadol, Sulfa Antibiotics, Sulfacetamide Sodium Psychotropic Info: FLUoxetine (PROZAC) capsule 10 mg every morning PO;  rOPINIRole (REQUIP) tablet 0.5 mg daily at bedtime         Current Medications (07/24/2019):  This is the current hospital active medication list Current Facility-Administered Medications  Medication Dose Route Frequency Provider Last Rate Last Admin   . 0.9 %  sodium chloride infusion   Intravenous Continuous Gilles Chiquito B, MD 100 mL/hr at 07/24/19 1022 New Bag at 07/24/19 1022  . acetaminophen (TYLENOL) tablet 650 mg  650 mg Oral Q6H Norm Parcel, PA-C   650 mg at 07/24/19 1156  . amiodarone (PACERONE) tablet 200 mg  200 mg Oral Daily Chundi, Verne Spurr, MD   Stopped at 07/23/19 1600  . apixaban (ELIQUIS) tablet 5 mg  5 mg Oral BID Chundi, Vahini, MD   5 mg at 07/24/19 1153  . aspirin EC tablet 81 mg  81 mg Oral Q M,W,F Chundi, Vahini, MD   81 mg at 07/23/19 1738  . diltiazem (CARDIZEM CD) 24 hr capsule 180 mg  180 mg Oral Daily Chundi, Vahini, MD   180 mg at 07/23/19 1737  . FLUoxetine (PROZAC) capsule 10 mg  10 mg Oral q AM Chundi, Vahini, MD   10 mg at 07/24/19 0640  . furosemide (LASIX) tablet 40 mg  40 mg Oral BID Chundi, Verne Spurr, MD   40 mg at 07/24/19 0742  . HYDROcodone-acetaminophen (NORCO/VICODIN) 5-325 MG per tablet 1 tablet  1 tablet Oral Q6H PRN Norm Parcel, PA-C   1 tablet at 07/24/19 1457  . hydrocortisone (CORTEF) tablet 20 mg  20 mg Oral BID Chundi, Vahini, MD   20 mg at 07/24/19 1156  . ipratropium-albuterol (DUONEB) 0.5-2.5 (3) MG/3ML nebulizer solution 3 mL  3 mL Nebulization BID Gilles Chiquito B, MD   3 mL at 07/24/19 0746  . irbesartan (AVAPRO) tablet 75 mg  75 mg Oral Daily Chundi, Vahini, MD      . levothyroxine (SYNTHROID) tablet 88 mcg  88 mcg Oral Q0600 Lars Mage, MD   88 mcg at 07/24/19 0536  . lidocaine (LIDODERM) 5 % 1 patch  1 patch Transdermal Q24H Norm Parcel, PA-C   1 patch at 07/24/19 1217  . methocarbamol (ROBAXIN) tablet 500 mg  500 mg Oral TID Barkley Boards R, PA-C   500 mg at 07/24/19 1457  . mirabegron ER (MYRBETRIQ) tablet 50 mg  50 mg Oral Daily Chundi, Vahini, MD   50 mg at 07/24/19 1154  . morphine 2 MG/ML injection 2-4 mg  2-4 mg Intravenous Q3H PRN Norm Parcel, PA-C   4 mg at 07/23/19 1421  . nitroGLYCERIN (NITROSTAT) SL tablet 0.4 mg  0.4 mg Sublingual Q5 min PRN Chundi,  Vahini, MD      . pantoprazole (PROTONIX) EC tablet 40 mg  40 mg Oral Daily Chundi, Vahini, MD   40 mg at 07/24/19 1155  . promethazine (PHENERGAN) tablet 12.5 mg  12.5 mg Oral Q6H PRN Chundi, Vahini, MD      . rOPINIRole (REQUIP) tablet 0.5 mg  0.5 mg Oral QHS Chundi, Vahini, MD   0.5 mg at 07/23/19 2342  . senna-docusate (Senokot-S) tablet 1 tablet  1 tablet Oral QHS PRN Lars Mage, MD         Discharge Medications: Please see discharge summary for a list of discharge medications.  Relevant Imaging Results:  Relevant Lab Results:   Additional Information SS#[id number] [place], [place]

## 2019-07-25 ENCOUNTER — Ambulatory Visit: Payer: Medicare PPO | Admitting: Physical Therapy

## 2019-07-25 DIAGNOSIS — W1830XA Fall on same level, unspecified, initial encounter: Secondary | ICD-10-CM | POA: Diagnosis not present

## 2019-07-25 DIAGNOSIS — S2241XA Multiple fractures of ribs, right side, initial encounter for closed fracture: Secondary | ICD-10-CM | POA: Diagnosis not present

## 2019-07-25 DIAGNOSIS — R0602 Shortness of breath: Secondary | ICD-10-CM

## 2019-07-25 DIAGNOSIS — S5011XA Contusion of right forearm, initial encounter: Secondary | ICD-10-CM | POA: Diagnosis not present

## 2019-07-25 DIAGNOSIS — Z951 Presence of aortocoronary bypass graft: Secondary | ICD-10-CM

## 2019-07-25 DIAGNOSIS — M549 Dorsalgia, unspecified: Secondary | ICD-10-CM

## 2019-07-25 DIAGNOSIS — E2740 Unspecified adrenocortical insufficiency: Secondary | ICD-10-CM

## 2019-07-25 DIAGNOSIS — N179 Acute kidney failure, unspecified: Secondary | ICD-10-CM | POA: Diagnosis not present

## 2019-07-25 LAB — BASIC METABOLIC PANEL
Anion gap: 9 (ref 5–15)
BUN: 23 mg/dL (ref 8–23)
CO2: 24 mmol/L (ref 22–32)
Calcium: 8.4 mg/dL — ABNORMAL LOW (ref 8.9–10.3)
Chloride: 103 mmol/L (ref 98–111)
Creatinine, Ser: 1.12 mg/dL — ABNORMAL HIGH (ref 0.44–1.00)
GFR calc Af Amer: 54 mL/min — ABNORMAL LOW (ref >60)
GFR calc non Af Amer: 46 mL/min — ABNORMAL LOW (ref >60)
Glucose, Bld: 178 mg/dL — ABNORMAL HIGH (ref 70–99)
Potassium: 4.1 mmol/L (ref 3.5–5.1)
Sodium: 136 mmol/L (ref 135–145)

## 2019-07-25 LAB — CBC
HCT: 31.9 % — ABNORMAL LOW (ref 36.0–46.0)
Hemoglobin: 10.2 g/dL — ABNORMAL LOW (ref 12.0–15.0)
MCH: 30.4 pg (ref 26.0–34.0)
MCHC: 32 g/dL (ref 30.0–36.0)
MCV: 94.9 fL (ref 80.0–100.0)
Platelets: 214 10*3/uL (ref 150–400)
RBC: 3.36 MIL/uL — ABNORMAL LOW (ref 3.87–5.11)
RDW: 13.1 % (ref 11.5–15.5)
WBC: 7.8 10*3/uL (ref 4.0–10.5)
nRBC: 0 % (ref 0.0–0.2)

## 2019-07-25 MED ORDER — IPRATROPIUM-ALBUTEROL 0.5-2.5 (3) MG/3ML IN SOLN: 3.0000 mL | Freq: Four times a day (QID) | RESPIRATORY_TRACT | Status: DC | PRN

## 2019-07-25 MED ORDER — PSYLLIUM 95 % PO PACK
1.0000 | PACK | ORAL | Status: DC | PRN
  Administered 2019-07-25 – 2019-07-27 (×3): 1 via ORAL
  Filled 2019-07-25 (×3): qty 1

## 2019-07-25 NOTE — Progress Notes (Signed)
Subjective:  Ms. Wan was examined and evaluated at bedside this am. She mentions that she had a very rough night due to exacerbation of her back pain and continuing chest pain. She mentions that with her pain regimen, she began to 'see things that aren't there' and states she wants to reduce her pain medications. Discussed plan to await SNF placement.  Objective:  Vital Signs (last 24 hours): Vitals:   07/24/19 2043 07/24/19 2052 07/24/19 2236 07/25/19 0501  BP: (!) 91/55   (!) 104/55  Pulse: 61   62  Resp: 16   16  Temp: (!) 97.5 F (36.4 C)   98.4 F (36.9 C)  TempSrc: Oral   Oral  SpO2: 96% 96% 94% 98%  Weight:      Height:        Physical Exam: General Alert and answers questions appropriately, no acute distress  Cardiac Regular rate and rhythm, no murmurs, rubs, or gallops  Pulmonary Clear to auscultation bilaterally without wheezes, rhonchi, or rales   BMP Latest Ref Rng & Units 07/25/2019 07/24/2019 07/23/2019  Glucose 70 - 99 mg/dL 178(H) 145(H) 163(H)  BUN 8 - 23 mg/dL 23 23 18   Creatinine 0.44 - 1.00 mg/dL 1.12(H) 1.28(H) 1.10(H)  BUN/Creat Ratio 12 - 28 - - -  Sodium 135 - 145 mmol/L 136 138 138  Potassium 3.5 - 5.1 mmol/L 4.1 4.3 4.5  Chloride 98 - 111 mmol/L 103 104 102  CO2 22 - 32 mmol/L 24 24 27   Calcium 8.9 - 10.3 mg/dL 8.4(L) 8.3(L) 8.5(L)    CBC Latest Ref Rng & Units 07/25/2019 07/24/2019 07/23/2019  WBC 4.0 - 10.5 K/uL 7.8 11.1(H) 11.4(H)  Hemoglobin 12.0 - 15.0 g/dL 10.2(L) 10.3(L) 11.1(L)  Hematocrit 36.0 - 46.0 % 31.9(L) 33.2(L) 36.1  Platelets 150 - 400 K/uL 214 248 269     Assessment/Plan:   Principal Problem:   Acute kidney injury [facility]) Active Problems:   Rib fracture  Patient is an 79 year old female with past medical history significant for coronary artery disease, hypothyroidism, hypertension, Addison's disease, atrial fibrillation, colostomy, OSA who presented with chest pain after a fall 2 days prior.  # Acute kidney injury:  Creatinine of 1.28 this AM from 0.70 on 07/21/19. Likely prerenal in setting of decreased PO intake. Improved with mIVF NS 100 cc/hr. Creatinine trend: 1.10 -> 1.28 -> 1.12  # Rib fracture Patient with acute nondisplaced anterior rib fracture on right 4-6 anterior rib and 7-8 lateral rib fractures were noted on prior imaging 3 days ago.  Hemoglobin stable. Trauma surgery signed off *Incentive spirometry *Pain control: Morphine 2-4 mg Q3HR PRN + Norco 5-325 mg Q6HR PRN. Received Norco 4x in past 24 hours, morphine x1 *PT/OT consulted, recommend SNF  # Right forearm hematoma Patient with significant bruising on the right forearm.  Raised 3-4 cm area. No tenderness on palpation. CT forearm was obtained for further evaluation and revealed no bony abnormality but demonstrated a 3.7x2.7x1.6 cm hematoma. *Continue observation  # Chronic back pain CT cervical spine from 07/21/2019 demonstrated mild degenerative changes, no traumatic injury.  CT lumbar spine on 02/10/2019 demonstrated disc protrusions, subarticular narrowing, stenosis. *Patient will need followup with neurosurgery as outpatient *PT and OT *Continue fluoxetine 10 mg daily  # Persistent atrial fibrillation Patient's hemodynamically stable, last HR 62 *Continue home amiodarone 200 mg daily + diltiazem 180 mg daily  # Shortness of breath: Echo performed 09/20/2015 revealed EF 65-70%, moderate LVH. Per cardiology note in February 2021, concern for possible  amiodarone associated lung injury and on lasix as outpatient. Holding lasix given renal function  # Hypothyroidism: Continue home levothyroxine 88 mcg daily  # Addison's disease: Continue home hydrocortisone 20 mg twice daily  # CAD status post CABG: Continue aspirin 81 mg daily  PT/OT: Consulted Diet: Heart DVT Ppx: On Eliquis 5 mg daily Admit Status: Inpatient Dispo: Anticipated discharge pending placement, medically clear for discharge  Jeanmarie Hubert, MD 07/25/2019,  7:56 AM

## 2019-07-25 NOTE — Plan of Care (Signed)
  Problem: Education: Goal: Knowledge of General Education information will improve Description Including pain rating scale, medication(s)/side effects and non-pharmacologic comfort measures Outcome: Progressing   

## 2019-07-25 NOTE — Progress Notes (Signed)
RT to room to ask pt about wearing CPAP, pt was calling for help upon my arrival. Pt states she is in pain, cold, and couldn't find call bell. Pt also states she did not tolerate CPAP the previous night. RT helped pt find call bell to notify for RN, fixed her sheets and blanket, and adjusted thermostat. Pt seemed more calm, so RT made sure call bell was within reach before leaving.

## 2019-07-25 NOTE — Progress Notes (Signed)
Physical Therapy Treatment Patient Details Name: [name] MRN: [id number] DOB: Jul 26, 1940 Today's Date: 07/25/2019    History of Present Illness Ms. Kellar is a 79 y.o f with CAD, hypothyroidism, HTN, afib, back pain, nonfunctioning spinal cord stimulator (removed 04/2019), colostomy, OSA, admitted 07/23/19 with chest pain after having a fall two days ago. Pt sustained multiple R-side rib fxs (4-8), R forearm hematoma.    PT Comments     Pt progressing slowly towards her physical therapy goals. Requiring min assist for transfers, ambulating 10 feet with a walker at a min guard assist level (chair follow utilized). Rest of session focused on seated therapeutic exercises for strengthening and postural re-education. Pt continues with weakness, decreased endurance and pain. Continue to recommend SNF for ongoing Physical Therapy.      Follow Up Recommendations  SNF;Supervision for mobility/OOB     Equipment Recommendations  Other (comment)(defer)    Recommendations for Other Services       Precautions / Restrictions Precautions Precautions: Fall;Other (comment) Precaution Comments: colostomy bag Restrictions Weight Bearing Restrictions: No    Mobility  Bed Mobility               General bed mobility comments: OOB in chair  Transfers Overall transfer level: Needs assistance Equipment used: Rolling walker (2 wheeled) Transfers: Sit to/from Stand Sit to Stand: Min assist         General transfer comment: MinA to rise from chair  Ambulation/Gait Ambulation/Gait assistance: Min guard Gait Distance (Feet): 10 Feet Assistive device: Rolling walker (2 wheeled) Gait Pattern/deviations: Step-through pattern;Decreased stride length;Antalgic;Trunk flexed Gait velocity: decreased Gait velocity interpretation: <1.31 ft/sec, indicative of household ambulator General Gait Details: Slow, painful, gait, min guard to steady. Close chair follow utilized   Proofreader Rankin (Stroke Patients Only)       Balance Overall balance assessment: Needs assistance   Sitting balance-Leahy Scale: Fair       Standing balance-Leahy Scale: Poor Standing balance comment: Reliant on UE support                            Cognition Arousal/Alertness: Awake/alert Behavior During Therapy: WFL for tasks assessed/performed Overall Cognitive Status: Within Functional Limits for tasks assessed                                        Exercises General Exercises - Upper Extremity Shoulder Flexion: Both;10 reps;Seated(to 90 deg) General Exercises - Lower Extremity Long Arc Quad: Both;10 reps;Seated Hip Flexion/Marching: Both;10 reps;Seated Other Exercises Other Exercises: Pulling ~1250 on IS x 10 Other Exercises: Seated: Scapular retractions x 10    General Comments        Pertinent Vitals/Pain Pain Assessment: Faces Faces Pain Scale: Hurts whole lot Pain Location: Rib fxs, back Pain Descriptors / Indicators: Grimacing;Guarding;Moaning Pain Intervention(s): Limited activity within patient's tolerance;Monitored during session;Repositioned    Home Living                      Prior Function            PT Goals (current goals can now be found in the care plan section) Acute Rehab PT Goals Patient Stated Goal: Return home to dog Potential to Achieve Goals: Good Progress towards  PT goals: Progressing toward goals    Frequency    Min 3X/week      PT Plan Current plan remains appropriate    Co-evaluation              AM-PAC PT "6 Clicks" Mobility   Outcome Measure  Help needed turning from your back to your side while in a flat bed without using bedrails?: A Lot Help needed moving from lying on your back to sitting on the side of a flat bed without using bedrails?: A Lot Help needed moving to and from a bed to a chair (including a wheelchair)?: A  Little Help needed standing up from a chair using your arms (e.g., wheelchair or bedside chair)?: A Little Help needed to walk in hospital room?: A Little Help needed climbing 3-5 steps with a railing? : A Lot 6 Click Score: 15    End of Session Equipment Utilized During Treatment: Gait belt Activity Tolerance: Patient tolerated treatment well Patient left: with call bell/phone within reach;in chair;with chair alarm set   PT Visit Diagnosis: Other abnormalities of gait and mobility (R26.89);Muscle weakness (generalized) (M62.81);Pain Pain - part of body: (back, ribs)     Time: [id number] PT Time Calculation (min) (ACUTE ONLY): 18 min  Charges:  $Therapeutic Exercise: 8-22 mins                       Wyona Almas, PT, DPT Acute Rehabilitation Services Pager [phone] Office [phone]    Deno Etienne 07/25/2019, 5:04 PM

## 2019-07-25 NOTE — TOC Progression Note (Addendum)
Transition of Care [facility]) - Progression Note    Patient Details  Name: [name] MRN: [id number] Date of Birth: 03-04-1941  [facility] CM/SW Contact  Jacalyn Lefevre Edson Snowball, RN Phone Number: 07/25/2019, 10:44 AM  Clinical Narrative:     PAtient from home alone. Has an aide 3 hours in morning and in afternoon on Monday, Tuesday , Wednesday and Friday.   On Thursdays she has a cleaning lady come in.   Discussed PT recommendation for SNF. Patient has been to [facility] and does not want to return. NCM provided her SNF bed offers , she has five. Provided medicare.gov list.   Discussed home health would not make daily visits and visits would only be 30 to 45 minutes.   Patient's son lives in [place] and will be at hospital in 2 hours. Patient will discuss SNF offers with son and then decide. NCM will follow up later today. Insurance Josem Kaufmann has already been submitted.  1530 followed up with patient. Son and son in law just arrived to patient's room and would like a few minutes to discuss SNF , will f/u   Received PASRR [id number]  1600 patient ,son, and son in law have decided on [place] with [facility] aware. Last covid 4/21 , if patient discharges tomorrow she will not need a new covid.  [facility] spoke with Neoma Laming , she did not have a record of authorization being started. Provided information again and faxed in clinicals to [phone] ref number [id number]  If  auth received patient can discharge 4/24 to [facility] Rm 109p call report to [phone]  Expected Discharge Plan: [place] Barriers to Discharge: Continued Medical Work up  Expected Discharge Plan and Services Expected Discharge Plan: [place]   Discharge Planning Services: CM Consult Post Acute Care Choice: [place] arrangements for the past 2 months: Single Family Home                   DME Agency: NA       HH  Arranged: NA           Social Determinants of Health (SDOH) Interventions    Readmission Risk Interventions No flowsheet data found.

## 2019-07-25 NOTE — Discharge Instructions (Signed)
Fall Prevention in the Home, Adult Falls can cause injuries. They can happen to people of all ages. There are many things you can do to make your home safe and to help prevent falls. Ask for help when making these changes, if needed. What actions can I take to prevent falls? General Instructions  Use good lighting in all rooms. Replace any light bulbs that burn out.  Turn on the lights when you go into a dark area. Use night-lights.  Keep items that you use often in easy-to-reach places. Lower the shelves around your home if necessary.  Set up your furniture so you have a clear path. Avoid moving your furniture around.  Do not have throw rugs and other things on the floor that can make you trip.  Avoid walking on wet floors.  If any of your floors are uneven, fix them.  Add color or contrast paint or tape to clearly mark and help you see: ? Any grab bars or handrails. ? First and last steps of stairways. ? Where the edge of each step is.  If you use a stepladder: ? Make sure that it is fully opened. Do not climb a closed stepladder. ? Make sure that both sides of the stepladder are locked into place. ? Ask someone to hold the stepladder for you while you use it.  If there are any pets around you, be aware of where they are. What can I do in the bathroom?      Keep the floor dry. Clean up any water that spills onto the floor as soon as it happens.  Remove soap buildup in the tub or shower regularly.  Use non-skid mats or decals on the floor of the tub or shower.  Attach bath mats securely with double-sided, non-slip rug tape.  If you need to sit down in the shower, use a plastic, non-slip stool.  Install grab bars by the toilet and in the tub and shower. Do not use towel bars as grab bars. What can I do in the bedroom?  Make sure that you have a light by your bed that is easy to reach.  Do not use any sheets or blankets that are too big for your bed. They should not  hang down onto the floor.  Have a firm chair that has side arms. You can use this for support while you get dressed. What can I do in the kitchen?  Clean up any spills right away.  If you need to reach something above you, use a strong step stool that has a grab bar.  Keep electrical cords out of the way.  Do not use floor polish or wax that makes floors slippery. If you must use wax, use non-skid floor wax. What can I do with my stairs?  Do not leave any items on the stairs.  Make sure that you have a light switch at the top of the stairs and the bottom of the stairs. If you do not have them, ask someone to add them for you.  Make sure that there are handrails on both sides of the stairs, and use them. Fix handrails that are broken or loose. Make sure that handrails are as long as the stairways.  Install non-slip stair treads on all stairs in your home.  Avoid having throw rugs at the top or bottom of the stairs. If you do have throw rugs, attach them to the floor with carpet tape.  Choose a carpet that does   not hide the edge of the steps on the stairway.  Check any carpeting to make sure that it is firmly attached to the stairs. Fix any carpet that is loose or worn. What can I do on the outside of my home?  Use bright outdoor lighting.  Regularly fix the edges of walkways and driveways and fix any cracks.  Remove anything that might make you trip as you walk through a door, such as a raised step or threshold.  Trim any bushes or trees on the path to your home.  Regularly check to see if handrails are loose or broken. Make sure that both sides of any steps have handrails.  Install guardrails along the edges of any raised decks and porches.  Clear walking paths of anything that might make someone trip, such as tools or rocks.  Have any leaves, snow, or ice cleared regularly.  Use sand or salt on walking paths during winter.  Clean up any spills in your garage right  away. This includes grease or oil spills. What other actions can I take?  Wear shoes that: ? Have a low heel. Do not wear high heels. ? Have rubber bottoms. ? Are comfortable and fit you well. ? Are closed at the toe. Do not wear open-toe sandals.  Use tools that help you move around (mobility aids) if they are needed. These include: ? Canes. ? Walkers. ? Scooters. ? Crutches.  Review your medicines with your doctor. Some medicines can make you feel dizzy. This can increase your chance of falling. Ask your doctor what other things you can do to help prevent falls. Where to find more information  Centers for Disease Control and Prevention, STEADI: https://garcia.biz/  [employer] on Aging: BrainJudge.co.uk Contact a doctor if:  You are afraid of falling at home.  You feel weak, drowsy, or dizzy at home.  You fall at home. Summary  There are many simple things that you can do to make your home safe and to help prevent falls.  Ways to make your home safe include removing tripping hazards and installing grab bars in the bathroom.  Ask for help when making these changes in your home. This information is not intended to replace advice given to you by your health care provider. Make sure you discuss any questions you have with your health care provider. Document Revised: 07/11/2018 Document Reviewed: 11/02/2016 Elsevier Patient Education  2020 [place] on my medicine - ELIQUIS (apixaban)  This medication education was reviewed with me or my healthcare representative as part of my discharge preparation.  The pharmacist that spoke with me during my hospital stay was:  Onnie Boer, RPH-CPP  Why was Eliquis prescribed for you? Eliquis was prescribed for you to reduce the risk of a blood clot forming that can cause a stroke if you have a medical condition called atrial fibrillation (a type of irregular heartbeat).  What do You need to know about  Eliquis ? Take your Eliquis TWICE DAILY - one tablet in the morning and one tablet in the evening with or without food. If you have difficulty swallowing the tablet whole please discuss with your pharmacist how to take the medication safely.  Take Eliquis exactly as prescribed by your doctor and DO NOT stop taking Eliquis without talking to the doctor who prescribed the medication.  Stopping may increase your risk of developing a stroke.  Refill your prescription before you run out.  After discharge, you should have regular check-up  appointments with your healthcare provider that is prescribing your Eliquis.  In the future your dose may need to be changed if your kidney function or weight changes by a significant amount or as you get older.  What do you do if you miss a dose? If you miss a dose, take it as soon as you remember on the same day and resume taking twice daily.  Do not take more than one dose of ELIQUIS at the same time to make up a missed dose.  Important Safety Information A possible side effect of Eliquis is bleeding. You should call your healthcare provider right away if you experience any of the following: ? Bleeding from an injury or your nose that does not stop. ? Unusual colored urine (red or dark brown) or unusual colored stools (red or black). ? Unusual bruising for unknown reasons. ? A serious fall or if you hit your head (even if there is no bleeding).  Some medicines may interact with Eliquis and might increase your risk of bleeding or clotting while on Eliquis. To help avoid this, consult your healthcare provider or pharmacist prior to using any new prescription or non-prescription medications, including herbals, vitamins, non-steroidal anti-inflammatory drugs (NSAIDs) and supplements.  This website has more information on Eliquis (apixaban): http://www.eliquis.com/eliquis/home

## 2019-07-26 MED ORDER — HYDROCODONE-ACETAMINOPHEN 5-325 MG PO TABS
1.0000 | ORAL_TABLET | Freq: Four times a day (QID) | ORAL | Status: DC | PRN
  Administered 2019-07-26 (×2): 2 via ORAL
  Administered 2019-07-27 (×2): 1 via ORAL
  Administered 2019-07-27 – 2019-07-28 (×2): 2 via ORAL
  Administered 2019-07-28: 1 via ORAL
  Administered 2019-07-29: 2 via ORAL
  Filled 2019-07-26: qty 1
  Filled 2019-07-26: qty 2
  Filled 2019-07-26: qty 1
  Filled 2019-07-26 (×3): qty 2
  Filled 2019-07-26: qty 1
  Filled 2019-07-26: qty 2
  Filled 2019-07-26: qty 1

## 2019-07-26 NOTE — TOC Progression Note (Signed)
Transition of Care [facility]) - Progression Note    Patient Details  Name: [name] MRN: [id number] Date of Birth: [birth year]  Transition [facility] Care [facility]) CM/SW [place], [employer] Phone Number: 07/26/2019, 4:23 PM  Clinical Narrative:    CSW spoke with [employer].  Pt's insurance Josem Kaufmann is still pending.  CSW will continue to follow for discharge planning.   Expected Discharge Plan: [place] Barriers to Discharge: Continued Medical Work up  Expected Discharge Plan and Services Expected Discharge Plan: [place]   Discharge Planning Services: CM Consult Post Acute Care Choice: [place] Living arrangements for the past 2 months: Single Family Home                   DME Agency: NA       HH Arranged: NA           Social Determinants of Health (SDOH) Interventions    Readmission Risk Interventions No flowsheet data found.

## 2019-07-26 NOTE — Progress Notes (Signed)
   Subjective:  [name] is a 79 y.o. F with PMH of CAD, Hypothyroidism, HTN, Addison's Disease, A.fib, colostomy, OSA admit for rib fracture on hospital day 1  [name] was examined and evaluated at bedside this am. She mentions that she is continuing to endorse significant pain but denies any additional visual hallucinations associated with her pain meds. She mentions lack of colostomy output despite miralax administration. She denies any dyspnea, hemoptysis.  Objective:  Vital signs in last 24 hours: Vitals:   07/25/19 1359 07/25/19 2119 07/26/19 0507 07/26/19 0637  BP: 118/90 118/65 (!) 152/84   Pulse: 72 67 63   Resp: 18 19 16    Temp: 97.9 F (36.6 C) 98.6 F (37 C) 97.6 F (36.4 C)   TempSrc: Oral Oral Oral   SpO2: 91% 92% 94%   Weight:    120.2 kg  Height:       Gen: Well-developed, well nourished, NAD HEENT: NCAT head, hearing intact CV: RRR, S1, S2 normal, No rubs, no murmurs Pulm: CTAB, No rales, no wheezes, chest wall tenderness Abd: Soft, BS+, NTND, Colostomy bag in place with no content inside Extm: ROM intact, Peripheral pulses intact  Assessment/Plan:  Principal Problem:   Acute kidney injury [facility]) Active Problems:   Rib fracture   Fall from ground level  [name] is an 79 year old female with past medical history significant for coronary artery disease, hypothyroidism, hypertension, Addison's disease, atrial fibrillation, colostomy, OSA who presented with chest pain after a fall 2 days prior.  Rib fracture Patient with acute nondisplaced anterior rib fracture on right 4-6 anterior rib and 7-8 lateral rib fractures were noted on prior imaging 3 days ago. Hemoglobin stable. Trauma surgery signed off - C/w Incentive spirometry - C/w Norco 5-325 mg Q6HR PRN.  - Discharge to SNF when bed available  Right forearm hematoma Patient with significant bruising on the right forearm. Raised 3-4cm area. No tenderness on palpation. CT forearm was  obtained for further evaluation and revealed no bony abnormality but demonstrated a 3.7x2.7x1.6 cm hematoma. - Monitor  Chronic back pain CT cervical spine from 07/21/2019 demonstrated mild degenerative changes, no traumatic injury. CT lumbar spine on 02/10/2019 demonstrated disc protrusions, subarticular narrowing, stenosis. *Patient will need followup with neurosurgery as outpatient *PT and OT *Continue fluoxetine 10 mg daily  Persistent atrial fibrillation CHADSVASC2 score of 6. HR 59, on amiodarone, diltiazem and eliquis - C/w amiodarone, diltiazem, eliquis. *Continue home amiodarone 200 mg daily + diltiazem 180 mg daily  Hypothyroidism: Continue home levothyroxine 88 mcg daily Addison's disease: Continue home hydrocortisone 20 mg twice daily CAD status post CABG: Continue aspirin 81 mg daily  DVT prophx: Eliquis Diet: HH Bowel: Senokot Code: Full  Dispo: Anticipated discharge in approximately 0-1 day(s).   Mosetta Anis, MD 07/26/2019, 10:27 AM Pager: [phone]

## 2019-07-26 NOTE — Progress Notes (Signed)
Occupational Therapy Treatment Patient Details Name: [name] MRN: [id number] DOB: 1940/10/20 Today's Date: 07/26/2019    History of present illness Ms. 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 Pt sustained multiple R-side rib fxs (4-8), R forearm hematoma.   OT comments  Pt received sitting in recliner with her son and son-in-law upon arrival. Resting SpO2 95% RA, SpO2 following toileting 86%, increased to 90% with pursed lip breathing. Pt required modA for toileting hygiene, she had loss of balance x2 while engaging in functional activity with single UE support requiring physical assistance from therapist to correct. Pt will continue to benefit from skilled OT services to maximize safety and independence with ADL/IADL and functional mobility. Will continue to follow acutely and progress as tolerated.    Follow Up Recommendations  SNF;Supervision/Assistance - 24 hour    Equipment Recommendations  3 in 1 bedside commode    Recommendations for Other Services      Precautions / Restrictions Precautions Precautions: Fall;Other (comment) Precaution Comments: colostomy bag Restrictions Weight Bearing Restrictions: No       Mobility Bed Mobility Overal bed mobility: Needs Assistance Bed Mobility: Sit to Supine       Sit to supine: Min assist   General bed mobility comments: minA for BLE management  Transfers Overall transfer level: Needs assistance Equipment used: Rolling walker (2 wheeled) Transfers: Sit to/from Stand Sit to Stand: Min assist         General transfer comment: minA to stabilize and vc for safe hand placement    Balance Overall balance assessment: Needs assistance Sitting-balance support: No upper extremity supported;Feet supported Sitting balance-Leahy Scale: Fair Sitting  balance - Comments: unable to tolerate much challenge   Standing balance support: Bilateral upper extremity supported;During functional activity;Single extremity supported Standing balance-Leahy Scale: Poor Standing balance comment: 2 loss of balance standing with single upper extremity support while attempting to pull up briefs                           ADL either performed or assessed with clinical judgement   ADL Overall ADL's : Needs assistance/impaired     Grooming: Wash/dry hands;Set up;Sitting Grooming Details (indicate cue type and reason): EOB                 Toilet Transfer: Minimal assistance;RW;Ambulation Toilet Transfer Details (indicate cue type and reason): vc for safe hand placement Toileting- Clothing Manipulation and Hygiene: Moderate assistance;Sit to/from stand Toileting - Clothing Manipulation Details (indicate cue type and reason): modA for pericare and pulling up breifs, pt had 2 lob while attempting to pull up briefs in standing     Functional mobility during ADLs: Minimal assistance;Rolling walker General ADL Comments: decreased access to LB for ADL, decreased stability and decreased activity tolerance     Vision       Perception     Praxis      Cognition Arousal/Alertness: Awake/alert Behavior During Therapy: WFL for tasks assessed/performed Overall Cognitive Status: Within Functional Limits for tasks assessed                                          Exercises Other Exercises Other Exercises: pulling 1000 on IS x10  Shoulder Instructions       General Comments SpO2 86%-95% RA throughout session, pt pulling 1,000 on IS    Pertinent Vitals/ Pain       Pain Assessment: 0-10 Pain Score: 6  Pain Location: rib fxs, back Pain Descriptors / Indicators: Grimacing;Guarding Pain Intervention(s): Limited activity within patient's tolerance;Monitored during session;Patient requesting pain meds-RN notified  Home  Living                                          Prior Functioning/Environment              Frequency  Min 2X/week        Progress Toward Goals  OT Goals(current goals can now be found in the care plan section)  Progress towards OT goals: Progressing toward goals  Acute Rehab OT Goals Patient Stated Goal: return home OT Goal Formulation: With patient Time For Goal Achievement: 08/07/19 Potential to Achieve Goals: Good ADL Goals Pt Will Perform Grooming: with supervision;standing Pt Will Perform Upper Body Bathing: with supervision;sitting;with adaptive equipment Pt Will Perform Lower Body Bathing: with supervision;with adaptive equipment;sitting/lateral leans Pt Will Transfer to Toilet: with supervision;ambulating Pt Will Perform Toileting - Clothing Manipulation and hygiene: with supervision;sitting/lateral leans Additional ADL Goal #1: Pt will perform bed mobility at supervision level prior to engaging in ADL routine  Plan Discharge plan remains appropriate    Co-evaluation                 AM-PAC OT "6 Clicks" Daily Activity     Outcome Measure   Help from another person eating meals?: A Little Help from another person taking care of personal grooming?: A Little Help from another person toileting, which includes using toliet, bedpan, or urinal?: A Lot Help from another person bathing (including washing, rinsing, drying)?: A Lot Help from another person to put on and taking off regular upper body clothing?: A Little Help from another person to put on and taking off regular lower body clothing?: A Lot 6 Click Score: 15    End of Session Equipment Utilized During Treatment: Rolling walker;Gait belt  OT Visit Diagnosis: Unsteadiness on feet (R26.81);Other abnormalities of gait and mobility (R26.89);History of falling (Z91.81);Muscle weakness (generalized) (M62.81)   Activity Tolerance Patient tolerated treatment well   Patient Left in  bed;with call bell/phone within reach;with bed alarm set   Nurse Communication Mobility status        Time: [id number] OT Time Calculation (min): 27 min  Charges: OT General Charges $OT Visit: 1 Visit OT Treatments $Self Care/Home Management : 23-37 mins  Helene Kelp OTR/L Acute Rehabilitation Services Office: [place] 07/26/2019, 4:07 PM

## 2019-07-27 DIAGNOSIS — W1830XA Fall on same level, unspecified, initial encounter: Secondary | ICD-10-CM

## 2019-07-27 DIAGNOSIS — M542 Cervicalgia: Secondary | ICD-10-CM | POA: Diagnosis not present

## 2019-07-27 DIAGNOSIS — N179 Acute kidney failure, unspecified: Secondary | ICD-10-CM

## 2019-07-27 DIAGNOSIS — S2241XA Multiple fractures of ribs, right side, initial encounter for closed fracture: Secondary | ICD-10-CM | POA: Diagnosis not present

## 2019-07-27 DIAGNOSIS — I2510 Atherosclerotic heart disease of native coronary artery without angina pectoris: Secondary | ICD-10-CM

## 2019-07-27 DIAGNOSIS — D510 Vitamin B12 deficiency anemia due to intrinsic factor deficiency: Secondary | ICD-10-CM

## 2019-07-27 DIAGNOSIS — E039 Hypothyroidism, unspecified: Secondary | ICD-10-CM

## 2019-07-27 DIAGNOSIS — I4819 Other persistent atrial fibrillation: Secondary | ICD-10-CM

## 2019-07-27 MED ORDER — SENNOSIDES-DOCUSATE SODIUM 8.6-50 MG PO TABS
1.0000 | ORAL_TABLET | Freq: Two times a day (BID) | ORAL | Status: DC
  Administered 2019-07-27 – 2019-07-29 (×5): 1 via ORAL
  Filled 2019-07-27 (×5): qty 1

## 2019-07-27 NOTE — Progress Notes (Signed)
Pt's family brought home CPAP with home mask/tubing. Cords checked for frays by this RT, and everything looks good. Assisted pt in setting up machine by adding sterile water (per pt request), assisting with mask, and turning machine on. Pt resting comfortably at this time. RT will continue to monitor.

## 2019-07-27 NOTE — Progress Notes (Addendum)
   Subjective:  STEPHENIE Scott is a 79 y.o. F with PMH of CAD, Hypothyroidism, HTN, Addison's Disease, A.fib, colostomy, OSA admit for rib fracture   [name] was examined and evaluated at bedside this am. She mentions that she feels like her bladder is full but she cannot urinate. She feels that her pure-wick is not functioning. Reassured her that her pure-wick is in place and functioning appropriately and she has not soiled herself. She was observed requiring significant assistance on transfer to bedside chair. Denies any respiratory distress or dyspnea.  Objective:  Vital signs in last 24 hours: Vitals:   07/26/19 0637 07/26/19 1410 07/26/19 2030 07/27/19 0436  BP:  (!) 104/53 (!) 121/47 (!) 123/53  Pulse:  63 68 68  Resp:  18 16 18   Temp:  98.4 F (36.9 C) 98.1 F (36.7 C) 97.8 F (36.6 C)  TempSrc:  Oral Oral Oral  SpO2:  95% 94% 99%  Weight: 120.2 kg     Height:       Physical Exam  Constitutional: She is oriented to person, place, and time and well-developed, well-nourished, and in no distress.  HENT:  Mouth/Throat: Oropharynx is clear and moist.  Cardiovascular: Normal rate, regular rhythm, normal heart sounds and intact distal pulses.  No murmur heard. Pulmonary/Chest: Effort normal and breath sounds normal. She has no wheezes. She has no rales. She exhibits tenderness (Exquisite chest wall tenderness).  Abdominal: Soft. Bowel sounds are normal. She exhibits no distension. There is no abdominal tenderness.  Colostomy bag in place, minimal stool output noted inside  Genitourinary:    Genitourinary Comments: Pure-wick in place and functioning   Musculoskeletal:        General: No edema. Normal range of motion.  Neurological: She is alert and oriented to person, place, and time.   Assessment/Plan:  Principal Problem:   Acute kidney injury [facility]) Active Problems:   Rib fracture   Fall from ground level  [name] is an 79 year old female with past medical  history significant for coronary artery disease, hypothyroidism, hypertension, Addison's disease, atrial fibrillation, colostomy, OSA who presented with chest pain after a fall 2 days prior.  Urinary Urgency Per patient minimal urinary output but has sensation that 'she has to go' Urine output recorded 1.3L yesterday. Pure-wick in place. Possibly outlet obstruction. - Bladder Scan - In and out cath as needed  Rib fracture Patient with acute nondisplaced anterior rib fracture on right 4-6 anterior rib and 7-8 lateral rib fractures were noted on prior imaging 3 days ago. Hemoglobin stable. Trauma surgery signed off - C/w Incentive spirometry - C/w Norco 5-325 mg Q6HR PRN.  - Discharge to SNF when insurance authorization is complete  Chronic back pain CT cervical spine from 07/21/2019 demonstrated mild degenerative changes, no traumatic injury. CT lumbar spine on 02/10/2019 demonstrated disc protrusions, subarticular narrowing, stenosis. *Patient will need followup with neurosurgery as outpatient *PT and OT *Continue fluoxetine 10 mg daily  Persistent atrial fibrillation CHADSVASC2 score of 6. HR 59, on amiodarone, diltiazem and eliquis - C/w amiodarone, diltiazem, eliquis.  Hypothyroidism: Continue home levothyroxine 88 mcg daily Addison's disease: Continue home hydrocortisone 20 mg twice daily CAD status post CABG: Continue aspirin 81 mg daily  DVT prophx: Eliquis Diet: HH Bowel: Senokot Code: Full  Dispo: Anticipated discharge in approximately 0-1 day(s).   Mosetta Anis, MD 07/27/2019, 10:56 AM Pager: [phone]

## 2019-07-28 ENCOUNTER — Ambulatory Visit: Payer: Medicare PPO

## 2019-07-28 DIAGNOSIS — M549 Dorsalgia, unspecified: Secondary | ICD-10-CM | POA: Diagnosis not present

## 2019-07-28 DIAGNOSIS — W1830XA Fall on same level, unspecified, initial encounter: Secondary | ICD-10-CM | POA: Diagnosis not present

## 2019-07-28 DIAGNOSIS — N179 Acute kidney failure, unspecified: Secondary | ICD-10-CM | POA: Diagnosis not present

## 2019-07-28 DIAGNOSIS — R3915 Urgency of urination: Secondary | ICD-10-CM

## 2019-07-28 DIAGNOSIS — S2241XA Multiple fractures of ribs, right side, initial encounter for closed fracture: Secondary | ICD-10-CM | POA: Diagnosis not present

## 2019-07-28 DIAGNOSIS — R339 Retention of urine, unspecified: Secondary | ICD-10-CM

## 2019-07-28 LAB — BASIC METABOLIC PANEL
Anion gap: 7 (ref 5–15)
BUN: 14 mg/dL (ref 8–23)
CO2: 27 mmol/L (ref 22–32)
Calcium: 8.7 mg/dL — ABNORMAL LOW (ref 8.9–10.3)
Chloride: 104 mmol/L (ref 98–111)
Creatinine, Ser: 0.83 mg/dL (ref 0.44–1.00)
GFR calc Af Amer: >60 mL/min (ref >60)
GFR calc non Af Amer: >60 mL/min (ref >60)
Glucose, Bld: 147 mg/dL — ABNORMAL HIGH (ref 70–99)
Potassium: 4.2 mmol/L (ref 3.5–5.1)
Sodium: 138 mmol/L (ref 135–145)

## 2019-07-28 LAB — SARS CORONAVIRUS 2 (TAT 6-24 HRS): SARS Coronavirus 2: NEGATIVE

## 2019-07-28 NOTE — Progress Notes (Addendum)
  Subjective:  Patient seen at bedside this morning.  Patient was upset as she had lost her glasses.  Glasses were able to be located.  Patient states she has some continued pain with movement.  Objective:   Vital Signs (last 24 hours): Vitals:   07/27/19 1553 07/27/19 2026 07/28/19 0341 07/28/19 0500  BP: 103/68 (!) 146/59 (!) 169/68   Pulse: 61 65 65   Resp:  16 16   Temp: 97.8 F (36.6 C) 98.3 F (36.8 C) 97.8 F (36.6 C)   TempSrc: Oral Oral Oral   SpO2:  95% 99%   Weight:    119.7 kg  Height:       Physical Exam: General Resting in bed, no acute distress  Pulmonary Breathing comfortably on room air, no cough, no distress   Neurology Alert and answers questions appropriately, no gross deficit   BMP Latest Ref Rng & Units 07/28/2019 07/25/2019 07/24/2019  Glucose 70 - 99 mg/dL 147(H) 178(H) 145(H)  BUN 8 - 23 mg/dL 14 23 23   Creatinine 0.44 - 1.00 mg/dL 0.83 1.12(H) 1.28(H)  BUN/Creat Ratio 12 - 28 - - -  Sodium 135 - 145 mmol/L 138 136 138  Potassium 3.5 - 5.1 mmol/L 4.2 4.1 4.3  Chloride 98 - 111 mmol/L 104 103 104  CO2 22 - 32 mmol/L 27 24 24   Calcium 8.9 - 10.3 mg/dL 8.7(L) 8.4(L) 8.3(L)    Assessment/Plan:   Principal Problem:   Acute kidney injury [facility]) Active Problems:   Rib fracture   Fall from ground level  Patient is an 79 year old female with past medical history significant for coronary artery disease, hypothyroidism, hypertension, Addison's disease, atrial fibrillation, colostomy, OSA presented with chest pain after a fall 2 days prior to presentation.  # Urinary urgency # Urinary retention Per patient yesterday, minimal output, sensation that she has to urinate.  Bladder Scan x2 yesterday with urinary retention (845 ml, 900 ml) requiring I/O cath. Urinary retention may be 2/2 to pain medication side effect - Bladder scan this AM - In and out cath as needed - Discontinue methocarbamol as this may be contributory  # Rib fracture Patient with acute  nondisplaced anterior rib fracture on right 4-6 anterior rib and 7-8 lateral rib fractures were noted on prior imaging 3 days ago.Hemoglobin stable. Trauma surgery signed off - C/w Incentive spirometry - C/w Norco 5-325 mg Q6HR PRN - Discharge to SNF when insurance authorization is complete  # Chronic back pain CT cervical spine from 07/21/2019 demonstrated mild degenerative changes, no traumatic injury. CT lumbar spine on 02/10/2019 demonstrated disc protrusions, subarticular narrowing, stenosis. *Patient will need followup with neurosurgery as outpatient *PT and OT *Continue fluoxetine 10 mg daily  # Persistent atrial fibrillation CHADSVASC2 score of 6. HR 59, on amiodarone, diltiazem and eliquis - C/w amiodarone, diltiazem, eliquis.  Hypothyroidism: Continue home levothyroxine 88 mcg daily Addison's disease: Continue home hydrocortisone 20 mg twice daily CAD status post CABG: Continue aspirin 81 mg daily  PT/OT: Recommend SNF Diet: Heart DVT Ppx: On therapeutic Lovenox Admit Status: Inpatient Dispo: Anticipated discharge pending placement, medically clear for discharge  Abigail Hubert, MD 07/28/2019, 6:45 AM

## 2019-07-28 NOTE — Care Management (Addendum)
Update 1445 Received insurance authorization for patient to go to [facility] number [id number] for 3 days starting today. End date 07/30/19 . SNF to provide updates to [facility] fax [phone]. Information given to Harriet Pho at [facility] . Await covid results.     Insurance authorization for SNF still pending. Will need new covid swab, prior to discharge.   Patient aware.   Juliann Pulse at [facility] aware.   Messaged Dr Benjamine Mola for new covid test. Bedside nurse Joaquim Lai aware also.   Magdalen Spatz RN

## 2019-07-28 NOTE — Progress Notes (Signed)
Physical Therapy Treatment Patient Details Name: [name] MRN: [id number] DOB: April 14, 1940 Today's Date: 07/28/2019    History of Present Illness Ms. 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 Pt sustained multiple R-side rib fxs (4-8), R forearm hematoma.    PT Comments    Patient progressing well towards PT goals. Reports improved pain but having difficulty with bed mobility. Improved ambulation distance with Min guard assist for balance/safety. Distance limited due to fatigue and back pain (chronic). Sp02 remained in 90s on RA throughout activity. Encouraged OOB for all meals and IS. Will continue to follow and progress as tolerated.   Follow Up Recommendations  SNF;Supervision for mobility/OOB     Equipment Recommendations  Other (comment)(defer)    Recommendations for Other Services       Precautions / Restrictions Precautions Precautions: Fall;Other (comment) Precaution Comments: colostomy bag Restrictions Weight Bearing Restrictions: No    Mobility  Bed Mobility Overal bed mobility: Needs Assistance Bed Mobility: Sit to Supine       Sit to supine: Min assist;HOB elevated   General bed mobility comments: minA for BLE management  Transfers Overall transfer level: Needs assistance Equipment used: Rolling walker (2 wheeled) Transfers: Sit to/from Stand Sit to Stand: Min guard;Min assist         General transfer comment: Min guard for safety. Stood from chair x1, from toilet x2, transferred back to EOB. Cues for hand placement as pt prefers to pull up onRW.  Ambulation/Gait Ambulation/Gait assistance: Min guard Gait Distance (Feet): 15 Feet(+ 30') Assistive device: Rolling walker (2 wheeled) Gait Pattern/deviations: Step-through pattern;Decreased stride length;Trunk flexed Gait velocity:  decreased   General Gait Details: Slow, mostly steady gait with use of RW for support; increased back pain with mobility. Sp02 in 90s with mobility on RA. 1 seated rest break due to urinary incontinence.   Stairs             Wheelchair Mobility    Modified Rankin (Stroke Patients Only)       Balance Overall balance assessment: Needs assistance Sitting-balance support: Feet supported;No upper extremity supported Sitting balance-Leahy Scale: Fair     Standing balance support: During functional activity Standing balance-Leahy Scale: Poor Standing balance comment: Requires UE support in standing.                            Cognition Arousal/Alertness: Awake/alert Behavior During Therapy: WFL for tasks assessed/performed Overall Cognitive Status: Within Functional Limits for tasks assessed                                        Exercises      General Comments        Pertinent Vitals/Pain Pain Assessment: Faces Faces Pain Scale: Hurts even more Pain Location: rib fxs, back Pain Descriptors / Indicators: Grimacing;Guarding Pain Intervention(s): Repositioned;Monitored during session;Limited activity within patient's tolerance;Patient requesting pain meds-RN notified    Home Living                      Prior Function            PT Goals (current goals can now be found in the care plan section) Progress towards PT  goals: Progressing toward goals    Frequency    Min 3X/week      PT Plan Current plan remains appropriate    Co-evaluation              AM-PAC PT "6 Clicks" Mobility   Outcome Measure  Help needed turning from your back to your side while in a flat bed without using bedrails?: A Little Help needed moving from lying on your back to sitting on the side of a flat bed without using bedrails?: A Little Help needed moving to and from a bed to a chair (including a wheelchair)?: A Little Help needed  standing up from a chair using your arms (e.g., wheelchair or bedside chair)?: A Little Help needed to walk in hospital room?: A Little Help needed climbing 3-5 steps with a railing? : A Lot 6 Click Score: 17    End of Session Equipment Utilized During Treatment: Gait belt Activity Tolerance: Patient tolerated treatment well Patient left: in bed;with call bell/phone within reach;with bed alarm set Nurse Communication: Mobility status;Patient requests pain meds PT Visit Diagnosis: Other abnormalities of gait and mobility (R26.89);Muscle weakness (generalized) (M62.81);Pain Pain - Right/Left: Right Pain - part of body: (ribs, back)     Time: [id number] PT Time Calculation (min) (ACUTE ONLY): 23 min  Charges:  $Gait Training: 8-22 mins $Therapeutic Activity: 8-22 mins                     Marisa Severin, PT, DPT Acute Rehabilitation Services Pager [phone] Office [place] 07/28/2019, 3:38 PM

## 2019-07-28 NOTE — Progress Notes (Signed)
RT helped patient place home CPAP on for tonight.

## 2019-07-28 NOTE — Plan of Care (Signed)
  Problem: Activity: Goal: Risk for activity intolerance will decrease Outcome: Progressing   Problem: Coping: Goal: Level of anxiety will decrease Outcome: Progressing   Problem: Elimination: Goal: Will not experience complications related to bowel motility Outcome: Progressing   Problem: Safety: Goal: Ability to remain free from injury will improve Outcome: Progressing   

## 2019-07-28 NOTE — Plan of Care (Signed)
  Problem: Education: Goal: Knowledge of General Education information will improve Description Including pain rating scale, medication(s)/side effects and non-pharmacologic comfort measures Outcome: Progressing   

## 2019-07-29 DIAGNOSIS — S2241XA Multiple fractures of ribs, right side, initial encounter for closed fracture: Secondary | ICD-10-CM | POA: Diagnosis not present

## 2019-07-29 DIAGNOSIS — R339 Retention of urine, unspecified: Secondary | ICD-10-CM | POA: Diagnosis not present

## 2019-07-29 DIAGNOSIS — S5011XA Contusion of right forearm, initial encounter: Secondary | ICD-10-CM | POA: Diagnosis not present

## 2019-07-29 DIAGNOSIS — W1830XA Fall on same level, unspecified, initial encounter: Secondary | ICD-10-CM | POA: Diagnosis not present

## 2019-07-29 MED ORDER — POLYETHYLENE GLYCOL 3350 17 G PO PACK: 17.0000 g | PACK | Freq: Every day | ORAL | 0 refills | Status: AC | PRN

## 2019-07-29 MED ORDER — PSYLLIUM 95 % PO PACK: 1.0000 | PACK | ORAL | 0 refills | Status: AC | PRN

## 2019-07-29 MED ORDER — HYDROCODONE-ACETAMINOPHEN 5-325 MG PO TABS: 1.0000 | ORAL_TABLET | Freq: Four times a day (QID) | ORAL | 0 refills | Status: AC | PRN

## 2019-07-29 NOTE — Care Management (Addendum)
Juliann Pulse at [facility] can accept patient today. Number to call report is [phone] patient will be going to room 107b.   Patient aware and will call family.  Messaged MD for discharge summary .  Notified Juliann Pulse at [facility] , MD added oxygen 2l [place] continuous.   DC Summary sent to [facility] via [place]. PTAR called for 1:30 pm. Patient and bedside nurse aware. NCM offered to call patient's son, patient stated she will call him.   Magdalen Spatz RN

## 2019-07-29 NOTE — Plan of Care (Signed)
  Problem: Education: Goal: Knowledge of General Education information will improve Description Including pain rating scale, medication(s)/side effects and non-pharmacologic comfort measures Outcome: Progressing   

## 2019-07-29 NOTE — Progress Notes (Signed)
  Subjective:  [name] was seen sitting up in her bed. We discussed about her discharge plan and she stated that she understood. Patient articulated frustrations with number of staff - expressed that there is not sufficient staff to take care of patient needs. Patient expressed thankfulness for the care she received but articulated areas for improvement. Patient encouraged to provide that feedback in her survey.  Objective:   Vital Signs (last 24 hours): Vitals:   07/28/19 0500 07/28/19 1330 07/28/19 2056 07/29/19 0433  BP:  112/75 (!) 107/46 124/63  Pulse:  68 67 63  Resp:  19 20 18   Temp:  98.1 F (36.7 C) 98.5 F (36.9 C) (!) 97.5 F (36.4 C)  TempSrc:  Oral Oral Oral  SpO2:  92% 96% 96%  Weight: 119.7 kg     Height:       Physical Exam: General Resting in bed, no acute distress  Pulmonary Breathing comfortably on room air, no cough, no distress   Neurology Alert and answers questions appropriately, no gross deficit   Assessment/Plan:   Principal Problem:   Acute kidney injury [facility]) Active Problems:   Rib fracture   Fall from ground level  Patient is an 79 year old female with past medical history significant for coronary artery disease, hypothyroidism, hypertension, Addison's disease, atrial fibrillation, colostomy, OSA who presented on 07/23/19 with chest pain after a fall 2 days prior to presentation.  # Urinary urgency # Urinary retention Patient with urinary retention over the weekend requiring in and out catheter. AM bladder scan with volume 434 ml  # Rib fracture Patient with acute nondisplaced anterior rib fracture on right 4-6 anterior rib and 7-8 lateral rib fractures were noted on prior imaging 3 days ago.Hemoglobin stable. Trauma surgery signed off - C/w Incentive spirometry - C/w Norco 5-325 mg Q6HR PRN - taken 3x over past 24 hours - Discharge to SNF wheninsurance authorization is complete  # Chronic back pain CT cervical spine from 07/21/2019  demonstrated mild degenerative changes, no traumatic injury. CT lumbar spine on 02/10/2019 demonstrated disc protrusions, subarticular narrowing, stenosis. *Patient will need followup with neurosurgery as outpatient *PT and OT *Continue fluoxetine 10 mg daily  # Persistent atrial fibrillation CHADSVASC2 score of 6. HR 82, on amiodarone, diltiazem and eliquis - C/w amiodarone, diltiazem, eliquis.  Hypothyroidism: Continue home levothyroxine 88 mcg daily Addison's disease: Continue home hydrocortisone 20 mg twice daily CAD status post CABG: Continue aspirin 81 mg daily  PT/OT: Recommend SNF Diet: Heart DVT Ppx: On therapeutic Lovenox Admit Status: Inpatient Dispo: Anticipated discharge pending placement, medically clear for discharge  Abigail Hubert, MD 07/29/2019, 7:47 AM

## 2019-08-01 ENCOUNTER — Telehealth: Payer: Self-pay | Admitting: Interventional Cardiology

## 2019-08-01 ENCOUNTER — Encounter: Payer: Medicare PPO | Admitting: Physical Therapy

## 2019-08-01 NOTE — Telephone Encounter (Signed)
Left detailed message for pt.  Per Dr. Michaele Offer lasix to 80 mg BID.  Advised if this dose did not help she may need to go to hospital for further diuresis.    Will forward to [name] and nurse for assistance with further fluid issues.

## 2019-08-01 NOTE — Telephone Encounter (Signed)
° °  Pt c/o swelling: STAT is pt has developed SOB within 24 hours  1) How much weight have you gained and in what time span? 9 lbs in 3 days  2) If swelling, where is the swelling located? Both ankles  3) Are you currently taking a fluid pill? 3 dose 40 mg lasix everyday  4) Are you currently SOB? A little bit but that is not unusual  5) Do you have a log of your daily weights (if so, list)? 197, 204, 216  6) Have you gained 3 pounds in a day or 5 pounds in a week? 3 days lbs   7) Have you traveled recently? Nop  Pt called, she said she is currently in nursing rehab because she fell and he ankles on both feet is swelling. She's been taking lasix 40 mg 2 in the morning and 1 in the afternoon and it is not doing anything. She said she has some SOB but that is not unusual for her. She wanted to know what she can do  Please call

## 2019-08-04 NOTE — Telephone Encounter (Signed)
Left message to call back  

## 2019-08-04 NOTE — Telephone Encounter (Signed)
Patient is returning Jennifer's call.

## 2019-08-04 NOTE — Telephone Encounter (Signed)
Spoke with pt and she did not receive the VM left Friday.  Swelling not improved.  Offered an appt this week with [name] but pt states she can't get here due to being in a rehab facility due to a recent fall where she obtained some broken ribs.  Advised pt to increase Furosemide to 80 BID as instructed by Dr. Lovena Le.  Advised pt to call back Thurs if no improvement.  Pt verbalized understanding and was appreciative for call.

## 2019-08-05 DIAGNOSIS — R031 Nonspecific low blood-pressure reading: Secondary | ICD-10-CM | POA: Diagnosis not present

## 2019-08-05 DIAGNOSIS — I10 Essential (primary) hypertension: Secondary | ICD-10-CM | POA: Diagnosis not present

## 2019-08-05 DIAGNOSIS — I4891 Unspecified atrial fibrillation: Secondary | ICD-10-CM | POA: Diagnosis not present

## 2019-08-05 DIAGNOSIS — M545 Low back pain: Secondary | ICD-10-CM | POA: Diagnosis not present

## 2019-08-05 DIAGNOSIS — K5900 Constipation, unspecified: Secondary | ICD-10-CM | POA: Diagnosis not present

## 2019-08-08 ENCOUNTER — Encounter: Payer: Medicare PPO | Admitting: Physical Therapy

## 2019-08-08 DIAGNOSIS — R5383 Other fatigue: Secondary | ICD-10-CM | POA: Diagnosis not present

## 2019-08-08 DIAGNOSIS — I4891 Unspecified atrial fibrillation: Secondary | ICD-10-CM | POA: Diagnosis not present

## 2019-08-08 DIAGNOSIS — R531 Weakness: Secondary | ICD-10-CM | POA: Diagnosis not present

## 2019-08-08 DIAGNOSIS — F411 Generalized anxiety disorder: Secondary | ICD-10-CM | POA: Diagnosis not present

## 2019-08-08 DIAGNOSIS — R001 Bradycardia, unspecified: Secondary | ICD-10-CM | POA: Diagnosis not present

## 2019-08-08 DIAGNOSIS — R5381 Other malaise: Secondary | ICD-10-CM | POA: Diagnosis not present

## 2019-08-11 DIAGNOSIS — I440 Atrioventricular block, first degree: Secondary | ICD-10-CM | POA: Diagnosis not present

## 2019-08-11 DIAGNOSIS — R001 Bradycardia, unspecified: Secondary | ICD-10-CM | POA: Diagnosis not present

## 2019-08-11 DIAGNOSIS — Z7901 Long term (current) use of anticoagulants: Secondary | ICD-10-CM | POA: Diagnosis not present

## 2019-08-11 DIAGNOSIS — R42 Dizziness and giddiness: Secondary | ICD-10-CM | POA: Diagnosis not present

## 2019-08-11 DIAGNOSIS — I4891 Unspecified atrial fibrillation: Secondary | ICD-10-CM | POA: Diagnosis not present

## 2019-08-11 DIAGNOSIS — Z933 Colostomy status: Secondary | ICD-10-CM | POA: Diagnosis not present

## 2019-08-12 DIAGNOSIS — R9431 Abnormal electrocardiogram [ECG] [EKG]: Secondary | ICD-10-CM | POA: Diagnosis not present

## 2019-08-13 DIAGNOSIS — W19XXXD Unspecified fall, subsequent encounter: Secondary | ICD-10-CM | POA: Diagnosis not present

## 2019-08-13 DIAGNOSIS — R5383 Other fatigue: Secondary | ICD-10-CM | POA: Diagnosis not present

## 2019-08-13 DIAGNOSIS — R531 Weakness: Secondary | ICD-10-CM | POA: Diagnosis not present

## 2019-08-13 DIAGNOSIS — I10 Essential (primary) hypertension: Secondary | ICD-10-CM | POA: Diagnosis not present

## 2019-08-13 DIAGNOSIS — R112 Nausea with vomiting, unspecified: Secondary | ICD-10-CM | POA: Diagnosis not present

## 2019-08-13 DIAGNOSIS — I509 Heart failure, unspecified: Secondary | ICD-10-CM | POA: Diagnosis not present

## 2019-08-13 DIAGNOSIS — Z933 Colostomy status: Secondary | ICD-10-CM | POA: Diagnosis not present

## 2019-08-13 DIAGNOSIS — Z03810 Encounter for observation for suspected exposure to anthrax ruled out: Secondary | ICD-10-CM | POA: Diagnosis not present

## 2019-08-13 DIAGNOSIS — M545 Low back pain: Secondary | ICD-10-CM | POA: Diagnosis not present

## 2019-08-13 DIAGNOSIS — F411 Generalized anxiety disorder: Secondary | ICD-10-CM | POA: Diagnosis not present

## 2019-08-13 DIAGNOSIS — N390 Urinary tract infection, site not specified: Secondary | ICD-10-CM | POA: Diagnosis not present

## 2019-08-13 DIAGNOSIS — S2243XD Multiple fractures of ribs, bilateral, subsequent encounter for fracture with routine healing: Secondary | ICD-10-CM | POA: Diagnosis not present

## 2019-08-13 DIAGNOSIS — R109 Unspecified abdominal pain: Secondary | ICD-10-CM | POA: Diagnosis not present

## 2019-08-13 DIAGNOSIS — F339 Major depressive disorder, recurrent, unspecified: Secondary | ICD-10-CM | POA: Diagnosis not present

## 2019-08-13 DIAGNOSIS — R102 Pelvic and perineal pain: Secondary | ICD-10-CM | POA: Diagnosis not present

## 2019-08-13 DIAGNOSIS — I4891 Unspecified atrial fibrillation: Secondary | ICD-10-CM | POA: Diagnosis not present

## 2019-08-13 DIAGNOSIS — I2510 Atherosclerotic heart disease of native coronary artery without angina pectoris: Secondary | ICD-10-CM | POA: Diagnosis not present

## 2019-08-13 DIAGNOSIS — S2241XD Multiple fractures of ribs, right side, subsequent encounter for fracture with routine healing: Secondary | ICD-10-CM | POA: Diagnosis not present

## 2019-08-13 DIAGNOSIS — R319 Hematuria, unspecified: Secondary | ICD-10-CM | POA: Diagnosis not present

## 2019-08-13 DIAGNOSIS — R82998 Other abnormal findings in urine: Secondary | ICD-10-CM | POA: Diagnosis not present

## 2019-08-13 DIAGNOSIS — E039 Hypothyroidism, unspecified: Secondary | ICD-10-CM | POA: Diagnosis not present

## 2019-08-13 DIAGNOSIS — Z20828 Contact with and (suspected) exposure to other viral communicable diseases: Secondary | ICD-10-CM | POA: Diagnosis not present

## 2019-08-13 DIAGNOSIS — E785 Hyperlipidemia, unspecified: Secondary | ICD-10-CM | POA: Diagnosis not present

## 2019-08-13 DIAGNOSIS — I5189 Other ill-defined heart diseases: Secondary | ICD-10-CM | POA: Diagnosis not present

## 2019-08-13 DIAGNOSIS — F329 Major depressive disorder, single episode, unspecified: Secondary | ICD-10-CM | POA: Diagnosis not present

## 2019-08-13 DIAGNOSIS — R4189 Other symptoms and signs involving cognitive functions and awareness: Secondary | ICD-10-CM | POA: Diagnosis not present

## 2019-08-13 DIAGNOSIS — F419 Anxiety disorder, unspecified: Secondary | ICD-10-CM | POA: Diagnosis not present

## 2019-08-13 DIAGNOSIS — I5030 Unspecified diastolic (congestive) heart failure: Secondary | ICD-10-CM | POA: Diagnosis not present

## 2019-08-13 DIAGNOSIS — R8279 Other abnormal findings on microbiological examination of urine: Secondary | ICD-10-CM | POA: Diagnosis not present

## 2019-08-13 DIAGNOSIS — Z0000 Encounter for general adult medical examination without abnormal findings: Secondary | ICD-10-CM | POA: Diagnosis not present

## 2019-08-14 DIAGNOSIS — R531 Weakness: Secondary | ICD-10-CM | POA: Diagnosis not present

## 2019-08-14 DIAGNOSIS — I509 Heart failure, unspecified: Secondary | ICD-10-CM | POA: Diagnosis not present

## 2019-08-14 DIAGNOSIS — I10 Essential (primary) hypertension: Secondary | ICD-10-CM | POA: Diagnosis not present

## 2019-08-14 DIAGNOSIS — M545 Low back pain: Secondary | ICD-10-CM | POA: Diagnosis not present

## 2019-08-15 ENCOUNTER — Encounter: Payer: Medicare PPO | Admitting: Physical Therapy

## 2019-08-18 ENCOUNTER — Ambulatory Visit: Payer: Medicare PPO

## 2019-08-18 DIAGNOSIS — I5189 Other ill-defined heart diseases: Secondary | ICD-10-CM | POA: Diagnosis not present

## 2019-08-18 DIAGNOSIS — R4189 Other symptoms and signs involving cognitive functions and awareness: Secondary | ICD-10-CM | POA: Diagnosis not present

## 2019-08-18 DIAGNOSIS — R5383 Other fatigue: Secondary | ICD-10-CM | POA: Diagnosis not present

## 2019-08-19 DIAGNOSIS — R102 Pelvic and perineal pain: Secondary | ICD-10-CM | POA: Diagnosis not present

## 2019-08-22 ENCOUNTER — Encounter: Payer: Medicare PPO | Admitting: Physical Therapy

## 2019-08-26 DIAGNOSIS — I5189 Other ill-defined heart diseases: Secondary | ICD-10-CM | POA: Diagnosis not present

## 2019-08-26 DIAGNOSIS — K632 Fistula of intestine: Secondary | ICD-10-CM | POA: Diagnosis not present

## 2019-08-26 DIAGNOSIS — E038 Other specified hypothyroidism: Secondary | ICD-10-CM | POA: Diagnosis not present

## 2019-08-26 DIAGNOSIS — E7849 Other hyperlipidemia: Secondary | ICD-10-CM | POA: Diagnosis not present

## 2019-08-26 DIAGNOSIS — I2510 Atherosclerotic heart disease of native coronary artery without angina pectoris: Secondary | ICD-10-CM | POA: Diagnosis not present

## 2019-08-26 DIAGNOSIS — M545 Low back pain: Secondary | ICD-10-CM | POA: Diagnosis not present

## 2019-08-26 DIAGNOSIS — M1388 Other specified arthritis, other site: Secondary | ICD-10-CM | POA: Diagnosis not present

## 2019-08-26 DIAGNOSIS — I10 Essential (primary) hypertension: Secondary | ICD-10-CM | POA: Diagnosis not present

## 2019-08-26 DIAGNOSIS — F418 Other specified anxiety disorders: Secondary | ICD-10-CM | POA: Diagnosis not present

## 2019-08-27 DIAGNOSIS — G4733 Obstructive sleep apnea (adult) (pediatric): Secondary | ICD-10-CM | POA: Diagnosis not present

## 2019-08-29 ENCOUNTER — Encounter: Payer: Medicare PPO | Admitting: Physical Therapy

## 2019-09-03 DIAGNOSIS — Z933 Colostomy status: Secondary | ICD-10-CM | POA: Diagnosis not present

## 2019-09-03 DIAGNOSIS — K5732 Diverticulitis of large intestine without perforation or abscess without bleeding: Secondary | ICD-10-CM | POA: Diagnosis not present

## 2019-09-03 DIAGNOSIS — N321 Vesicointestinal fistula: Secondary | ICD-10-CM | POA: Diagnosis not present

## 2019-09-05 ENCOUNTER — Encounter: Payer: Medicare PPO | Admitting: Physical Therapy

## 2019-09-11 DIAGNOSIS — E538 Deficiency of other specified B group vitamins: Secondary | ICD-10-CM | POA: Diagnosis not present

## 2019-09-11 DIAGNOSIS — R0600 Dyspnea, unspecified: Secondary | ICD-10-CM | POA: Diagnosis not present

## 2019-09-11 DIAGNOSIS — E7800 Pure hypercholesterolemia, unspecified: Secondary | ICD-10-CM | POA: Diagnosis not present

## 2019-09-11 DIAGNOSIS — I25709 Atherosclerosis of coronary artery bypass graft(s), unspecified, with unspecified angina pectoris: Secondary | ICD-10-CM | POA: Diagnosis not present

## 2019-09-11 DIAGNOSIS — E271 Primary adrenocortical insufficiency: Secondary | ICD-10-CM | POA: Diagnosis not present

## 2019-09-11 DIAGNOSIS — E559 Vitamin D deficiency, unspecified: Secondary | ICD-10-CM | POA: Diagnosis not present

## 2019-09-11 DIAGNOSIS — R600 Localized edema: Secondary | ICD-10-CM | POA: Diagnosis not present

## 2019-09-11 DIAGNOSIS — I480 Paroxysmal atrial fibrillation: Secondary | ICD-10-CM | POA: Diagnosis not present

## 2019-09-15 NOTE — Telephone Encounter (Signed)
ERROR

## 2019-09-17 DIAGNOSIS — Z933 Colostomy status: Secondary | ICD-10-CM | POA: Diagnosis not present

## 2019-09-17 DIAGNOSIS — N321 Vesicointestinal fistula: Secondary | ICD-10-CM | POA: Diagnosis not present

## 2019-09-17 DIAGNOSIS — K5732 Diverticulitis of large intestine without perforation or abscess without bleeding: Secondary | ICD-10-CM | POA: Diagnosis not present

## 2019-09-22 ENCOUNTER — Ambulatory Visit: Payer: Medicare PPO | Admitting: Interventional Cardiology

## 2019-09-27 DIAGNOSIS — G4733 Obstructive sleep apnea (adult) (pediatric): Secondary | ICD-10-CM | POA: Diagnosis not present

## 2019-10-06 DIAGNOSIS — Z933 Colostomy status: Secondary | ICD-10-CM | POA: Diagnosis not present

## 2019-10-06 DIAGNOSIS — N321 Vesicointestinal fistula: Secondary | ICD-10-CM | POA: Diagnosis not present

## 2019-10-06 DIAGNOSIS — K5732 Diverticulitis of large intestine without perforation or abscess without bleeding: Secondary | ICD-10-CM | POA: Diagnosis not present

## 2019-10-16 DIAGNOSIS — R109 Unspecified abdominal pain: Secondary | ICD-10-CM | POA: Diagnosis not present

## 2019-10-16 DIAGNOSIS — E538 Deficiency of other specified B group vitamins: Secondary | ICD-10-CM | POA: Diagnosis not present

## 2019-10-16 DIAGNOSIS — E7800 Pure hypercholesterolemia, unspecified: Secondary | ICD-10-CM | POA: Diagnosis not present

## 2019-10-16 DIAGNOSIS — F4321 Adjustment disorder with depressed mood: Secondary | ICD-10-CM | POA: Diagnosis not present

## 2019-10-16 DIAGNOSIS — D6489 Other specified anemias: Secondary | ICD-10-CM | POA: Diagnosis not present

## 2019-10-16 DIAGNOSIS — R600 Localized edema: Secondary | ICD-10-CM | POA: Diagnosis not present

## 2019-10-16 DIAGNOSIS — N390 Urinary tract infection, site not specified: Secondary | ICD-10-CM | POA: Diagnosis not present

## 2019-10-16 DIAGNOSIS — E271 Primary adrenocortical insufficiency: Secondary | ICD-10-CM | POA: Diagnosis not present

## 2019-10-16 DIAGNOSIS — I25709 Atherosclerosis of coronary artery bypass graft(s), unspecified, with unspecified angina pectoris: Secondary | ICD-10-CM | POA: Diagnosis not present

## 2019-10-16 DIAGNOSIS — E038 Other specified hypothyroidism: Secondary | ICD-10-CM | POA: Diagnosis not present

## 2019-10-16 DIAGNOSIS — M545 Low back pain: Secondary | ICD-10-CM | POA: Diagnosis not present

## 2019-10-16 DIAGNOSIS — E559 Vitamin D deficiency, unspecified: Secondary | ICD-10-CM | POA: Diagnosis not present

## 2019-10-16 DIAGNOSIS — I480 Paroxysmal atrial fibrillation: Secondary | ICD-10-CM | POA: Diagnosis not present

## 2019-10-20 DIAGNOSIS — K5732 Diverticulitis of large intestine without perforation or abscess without bleeding: Secondary | ICD-10-CM | POA: Diagnosis not present

## 2019-10-20 DIAGNOSIS — N321 Vesicointestinal fistula: Secondary | ICD-10-CM | POA: Diagnosis not present

## 2019-10-20 DIAGNOSIS — Z933 Colostomy status: Secondary | ICD-10-CM | POA: Diagnosis not present

## 2019-10-27 DIAGNOSIS — G4733 Obstructive sleep apnea (adult) (pediatric): Secondary | ICD-10-CM | POA: Diagnosis not present

## 2019-10-29 DIAGNOSIS — N390 Urinary tract infection, site not specified: Secondary | ICD-10-CM | POA: Diagnosis not present

## 2019-10-29 DIAGNOSIS — R7989 Other specified abnormal findings of blood chemistry: Secondary | ICD-10-CM | POA: Diagnosis not present

## 2019-10-29 DIAGNOSIS — E1169 Type 2 diabetes mellitus with other specified complication: Secondary | ICD-10-CM | POA: Diagnosis not present

## 2019-10-29 DIAGNOSIS — E785 Hyperlipidemia, unspecified: Secondary | ICD-10-CM | POA: Diagnosis not present

## 2019-10-29 DIAGNOSIS — W19XXXA Unspecified fall, initial encounter: Secondary | ICD-10-CM | POA: Diagnosis not present

## 2019-11-12 DIAGNOSIS — R0600 Dyspnea, unspecified: Secondary | ICD-10-CM | POA: Diagnosis not present

## 2019-11-12 DIAGNOSIS — E1159 Type 2 diabetes mellitus with other circulatory complications: Secondary | ICD-10-CM | POA: Diagnosis not present

## 2019-11-12 DIAGNOSIS — I480 Paroxysmal atrial fibrillation: Secondary | ICD-10-CM | POA: Diagnosis not present

## 2019-11-12 DIAGNOSIS — E1169 Type 2 diabetes mellitus with other specified complication: Secondary | ICD-10-CM | POA: Diagnosis not present

## 2019-11-12 DIAGNOSIS — I25709 Atherosclerosis of coronary artery bypass graft(s), unspecified, with unspecified angina pectoris: Secondary | ICD-10-CM | POA: Diagnosis not present

## 2019-11-12 DIAGNOSIS — E7800 Pure hypercholesterolemia, unspecified: Secondary | ICD-10-CM | POA: Diagnosis not present

## 2019-11-12 DIAGNOSIS — N390 Urinary tract infection, site not specified: Secondary | ICD-10-CM | POA: Diagnosis not present

## 2019-11-12 DIAGNOSIS — M545 Low back pain: Secondary | ICD-10-CM | POA: Diagnosis not present

## 2019-11-12 DIAGNOSIS — R110 Nausea: Secondary | ICD-10-CM | POA: Diagnosis not present

## 2019-11-12 DIAGNOSIS — F4321 Adjustment disorder with depressed mood: Secondary | ICD-10-CM | POA: Diagnosis not present

## 2019-11-18 DIAGNOSIS — M6281 Muscle weakness (generalized): Secondary | ICD-10-CM | POA: Diagnosis not present

## 2019-11-18 DIAGNOSIS — R269 Unspecified abnormalities of gait and mobility: Secondary | ICD-10-CM | POA: Diagnosis not present

## 2019-11-25 DIAGNOSIS — R269 Unspecified abnormalities of gait and mobility: Secondary | ICD-10-CM | POA: Diagnosis not present

## 2019-11-25 DIAGNOSIS — M6281 Muscle weakness (generalized): Secondary | ICD-10-CM | POA: Diagnosis not present

## 2019-11-27 DIAGNOSIS — G4733 Obstructive sleep apnea (adult) (pediatric): Secondary | ICD-10-CM | POA: Diagnosis not present

## 2019-11-27 DIAGNOSIS — R269 Unspecified abnormalities of gait and mobility: Secondary | ICD-10-CM | POA: Diagnosis not present

## 2019-11-27 DIAGNOSIS — M6281 Muscle weakness (generalized): Secondary | ICD-10-CM | POA: Diagnosis not present

## 2019-11-28 DIAGNOSIS — E119 Type 2 diabetes mellitus without complications: Secondary | ICD-10-CM | POA: Diagnosis not present

## 2019-11-28 DIAGNOSIS — H04123 Dry eye syndrome of bilateral lacrimal glands: Secondary | ICD-10-CM | POA: Diagnosis not present

## 2019-12-01 DIAGNOSIS — R0602 Shortness of breath: Secondary | ICD-10-CM | POA: Diagnosis not present

## 2019-12-01 DIAGNOSIS — U071 COVID-19: Secondary | ICD-10-CM | POA: Diagnosis not present

## 2019-12-01 DIAGNOSIS — J189 Pneumonia, unspecified organism: Secondary | ICD-10-CM | POA: Diagnosis not present

## 2019-12-01 DIAGNOSIS — I10 Essential (primary) hypertension: Secondary | ICD-10-CM | POA: Diagnosis not present

## 2019-12-01 DIAGNOSIS — E119 Type 2 diabetes mellitus without complications: Secondary | ICD-10-CM | POA: Diagnosis not present

## 2019-12-01 DIAGNOSIS — J029 Acute pharyngitis, unspecified: Secondary | ICD-10-CM | POA: Diagnosis not present

## 2019-12-01 DIAGNOSIS — E2740 Unspecified adrenocortical insufficiency: Secondary | ICD-10-CM | POA: Diagnosis not present

## 2019-12-01 DIAGNOSIS — G4733 Obstructive sleep apnea (adult) (pediatric): Secondary | ICD-10-CM | POA: Diagnosis not present

## 2019-12-01 DIAGNOSIS — R918 Other nonspecific abnormal finding of lung field: Secondary | ICD-10-CM | POA: Diagnosis not present

## 2019-12-01 DIAGNOSIS — I2510 Atherosclerotic heart disease of native coronary artery without angina pectoris: Secondary | ICD-10-CM | POA: Diagnosis not present

## 2019-12-01 DIAGNOSIS — R05 Cough: Secondary | ICD-10-CM | POA: Diagnosis not present

## 2019-12-01 DIAGNOSIS — R110 Nausea: Secondary | ICD-10-CM | POA: Diagnosis not present

## 2019-12-01 DIAGNOSIS — J1282 Pneumonia due to coronavirus disease 2019: Secondary | ICD-10-CM | POA: Diagnosis not present

## 2019-12-01 DIAGNOSIS — E039 Hypothyroidism, unspecified: Secondary | ICD-10-CM | POA: Diagnosis not present

## 2019-12-01 DIAGNOSIS — J159 Unspecified bacterial pneumonia: Secondary | ICD-10-CM | POA: Diagnosis not present

## 2019-12-01 DIAGNOSIS — I480 Paroxysmal atrial fibrillation: Secondary | ICD-10-CM | POA: Diagnosis not present

## 2019-12-01 DIAGNOSIS — I25709 Atherosclerosis of coronary artery bypass graft(s), unspecified, with unspecified angina pectoris: Secondary | ICD-10-CM | POA: Diagnosis not present

## 2019-12-01 DIAGNOSIS — Z7901 Long term (current) use of anticoagulants: Secondary | ICD-10-CM | POA: Diagnosis not present

## 2019-12-01 DIAGNOSIS — R509 Fever, unspecified: Secondary | ICD-10-CM | POA: Diagnosis not present

## 2019-12-01 DIAGNOSIS — J9601 Acute respiratory failure with hypoxia: Secondary | ICD-10-CM | POA: Diagnosis not present

## 2019-12-17 DIAGNOSIS — Z933 Colostomy status: Secondary | ICD-10-CM | POA: Diagnosis not present

## 2019-12-17 DIAGNOSIS — K5732 Diverticulitis of large intestine without perforation or abscess without bleeding: Secondary | ICD-10-CM | POA: Diagnosis not present

## 2019-12-17 DIAGNOSIS — N321 Vesicointestinal fistula: Secondary | ICD-10-CM | POA: Diagnosis not present

## 2019-12-18 DIAGNOSIS — N321 Vesicointestinal fistula: Secondary | ICD-10-CM | POA: Diagnosis not present

## 2019-12-18 DIAGNOSIS — Z933 Colostomy status: Secondary | ICD-10-CM | POA: Diagnosis not present

## 2019-12-18 DIAGNOSIS — K5732 Diverticulitis of large intestine without perforation or abscess without bleeding: Secondary | ICD-10-CM | POA: Diagnosis not present

## 2019-12-23 DIAGNOSIS — M6281 Muscle weakness (generalized): Secondary | ICD-10-CM | POA: Diagnosis not present

## 2019-12-23 DIAGNOSIS — R269 Unspecified abnormalities of gait and mobility: Secondary | ICD-10-CM | POA: Diagnosis not present

## 2019-12-25 DIAGNOSIS — R269 Unspecified abnormalities of gait and mobility: Secondary | ICD-10-CM | POA: Diagnosis not present

## 2019-12-25 DIAGNOSIS — M6281 Muscle weakness (generalized): Secondary | ICD-10-CM | POA: Diagnosis not present

## 2019-12-28 DIAGNOSIS — G4733 Obstructive sleep apnea (adult) (pediatric): Secondary | ICD-10-CM | POA: Diagnosis not present

## 2019-12-30 DIAGNOSIS — R269 Unspecified abnormalities of gait and mobility: Secondary | ICD-10-CM | POA: Diagnosis not present

## 2019-12-30 DIAGNOSIS — M6281 Muscle weakness (generalized): Secondary | ICD-10-CM | POA: Diagnosis not present

## 2019-12-31 DIAGNOSIS — M48061 Spinal stenosis, lumbar region without neurogenic claudication: Secondary | ICD-10-CM | POA: Diagnosis not present

## 2019-12-31 DIAGNOSIS — M545 Low back pain: Secondary | ICD-10-CM | POA: Diagnosis not present

## 2019-12-31 DIAGNOSIS — G8929 Other chronic pain: Secondary | ICD-10-CM | POA: Diagnosis not present

## 2019-12-31 DIAGNOSIS — Z981 Arthrodesis status: Secondary | ICD-10-CM | POA: Diagnosis not present

## 2020-01-01 DIAGNOSIS — K5732 Diverticulitis of large intestine without perforation or abscess without bleeding: Secondary | ICD-10-CM | POA: Diagnosis not present

## 2020-01-01 DIAGNOSIS — N321 Vesicointestinal fistula: Secondary | ICD-10-CM | POA: Diagnosis not present

## 2020-01-01 DIAGNOSIS — Z933 Colostomy status: Secondary | ICD-10-CM | POA: Diagnosis not present

## 2020-01-08 DIAGNOSIS — G894 Chronic pain syndrome: Secondary | ICD-10-CM | POA: Diagnosis not present

## 2020-01-08 DIAGNOSIS — G8929 Other chronic pain: Secondary | ICD-10-CM | POA: Diagnosis not present

## 2020-01-08 DIAGNOSIS — M48062 Spinal stenosis, lumbar region with neurogenic claudication: Secondary | ICD-10-CM | POA: Diagnosis not present

## 2020-01-08 DIAGNOSIS — Z981 Arthrodesis status: Secondary | ICD-10-CM | POA: Diagnosis not present

## 2020-01-08 DIAGNOSIS — M47816 Spondylosis without myelopathy or radiculopathy, lumbar region: Secondary | ICD-10-CM | POA: Diagnosis not present

## 2020-01-08 DIAGNOSIS — M545 Low back pain, unspecified: Secondary | ICD-10-CM | POA: Diagnosis not present

## 2020-01-13 DIAGNOSIS — Z933 Colostomy status: Secondary | ICD-10-CM | POA: Diagnosis not present

## 2020-01-13 DIAGNOSIS — N321 Vesicointestinal fistula: Secondary | ICD-10-CM | POA: Diagnosis not present

## 2020-01-13 DIAGNOSIS — K5732 Diverticulitis of large intestine without perforation or abscess without bleeding: Secondary | ICD-10-CM | POA: Diagnosis not present

## 2020-01-23 DIAGNOSIS — Z20822 Contact with and (suspected) exposure to covid-19: Secondary | ICD-10-CM | POA: Diagnosis not present

## 2020-01-27 DIAGNOSIS — G4733 Obstructive sleep apnea (adult) (pediatric): Secondary | ICD-10-CM | POA: Diagnosis not present

## 2020-01-30 DIAGNOSIS — G8929 Other chronic pain: Secondary | ICD-10-CM | POA: Diagnosis not present

## 2020-01-30 DIAGNOSIS — I2510 Atherosclerotic heart disease of native coronary artery without angina pectoris: Secondary | ICD-10-CM | POA: Diagnosis not present

## 2020-01-30 DIAGNOSIS — G894 Chronic pain syndrome: Secondary | ICD-10-CM | POA: Diagnosis not present

## 2020-01-30 DIAGNOSIS — M1990 Unspecified osteoarthritis, unspecified site: Secondary | ICD-10-CM | POA: Diagnosis not present

## 2020-01-30 DIAGNOSIS — I4891 Unspecified atrial fibrillation: Secondary | ICD-10-CM | POA: Diagnosis not present

## 2020-01-30 DIAGNOSIS — E785 Hyperlipidemia, unspecified: Secondary | ICD-10-CM | POA: Diagnosis not present

## 2020-01-30 DIAGNOSIS — Z882 Allergy status to sulfonamides status: Secondary | ICD-10-CM | POA: Diagnosis not present

## 2020-01-30 DIAGNOSIS — M545 Low back pain, unspecified: Secondary | ICD-10-CM | POA: Diagnosis not present

## 2020-01-30 DIAGNOSIS — I10 Essential (primary) hypertension: Secondary | ICD-10-CM | POA: Diagnosis not present

## 2020-01-30 DIAGNOSIS — Z951 Presence of aortocoronary bypass graft: Secondary | ICD-10-CM | POA: Diagnosis not present

## 2020-02-12 DIAGNOSIS — J1282 Pneumonia due to coronavirus disease 2019: Secondary | ICD-10-CM | POA: Diagnosis not present

## 2020-02-12 DIAGNOSIS — I25810 Atherosclerosis of coronary artery bypass graft(s) without angina pectoris: Secondary | ICD-10-CM | POA: Diagnosis not present

## 2020-02-12 DIAGNOSIS — U071 COVID-19: Secondary | ICD-10-CM | POA: Diagnosis not present

## 2020-02-12 DIAGNOSIS — I480 Paroxysmal atrial fibrillation: Secondary | ICD-10-CM | POA: Diagnosis not present

## 2020-02-12 DIAGNOSIS — I10 Essential (primary) hypertension: Secondary | ICD-10-CM | POA: Diagnosis not present

## 2020-02-12 DIAGNOSIS — E785 Hyperlipidemia, unspecified: Secondary | ICD-10-CM | POA: Diagnosis not present

## 2020-02-27 DIAGNOSIS — G4733 Obstructive sleep apnea (adult) (pediatric): Secondary | ICD-10-CM | POA: Diagnosis not present

## 2020-03-04 DIAGNOSIS — N321 Vesicointestinal fistula: Secondary | ICD-10-CM | POA: Diagnosis not present

## 2020-03-04 DIAGNOSIS — Z933 Colostomy status: Secondary | ICD-10-CM | POA: Diagnosis not present

## 2020-03-04 DIAGNOSIS — K5732 Diverticulitis of large intestine without perforation or abscess without bleeding: Secondary | ICD-10-CM | POA: Diagnosis not present

## 2020-03-11 DIAGNOSIS — I25810 Atherosclerosis of coronary artery bypass graft(s) without angina pectoris: Secondary | ICD-10-CM | POA: Diagnosis not present

## 2020-03-12 DIAGNOSIS — M48062 Spinal stenosis, lumbar region with neurogenic claudication: Secondary | ICD-10-CM | POA: Diagnosis not present

## 2020-03-12 DIAGNOSIS — G894 Chronic pain syndrome: Secondary | ICD-10-CM | POA: Diagnosis not present

## 2020-03-16 DIAGNOSIS — M47816 Spondylosis without myelopathy or radiculopathy, lumbar region: Secondary | ICD-10-CM | POA: Diagnosis not present

## 2020-03-16 DIAGNOSIS — I25810 Atherosclerosis of coronary artery bypass graft(s) without angina pectoris: Secondary | ICD-10-CM | POA: Diagnosis not present

## 2020-03-16 DIAGNOSIS — Z9689 Presence of other specified functional implants: Secondary | ICD-10-CM | POA: Diagnosis not present

## 2020-03-16 DIAGNOSIS — E661 Drug-induced obesity: Secondary | ICD-10-CM | POA: Diagnosis not present

## 2020-03-16 DIAGNOSIS — E2740 Unspecified adrenocortical insufficiency: Secondary | ICD-10-CM | POA: Diagnosis not present

## 2020-03-16 DIAGNOSIS — Z9049 Acquired absence of other specified parts of digestive tract: Secondary | ICD-10-CM | POA: Diagnosis not present

## 2020-03-16 DIAGNOSIS — I480 Paroxysmal atrial fibrillation: Secondary | ICD-10-CM | POA: Diagnosis not present

## 2020-03-16 DIAGNOSIS — E271 Primary adrenocortical insufficiency: Secondary | ICD-10-CM | POA: Diagnosis not present

## 2020-03-24 DIAGNOSIS — I480 Paroxysmal atrial fibrillation: Secondary | ICD-10-CM | POA: Diagnosis not present

## 2020-03-24 DIAGNOSIS — I10 Essential (primary) hypertension: Secondary | ICD-10-CM | POA: Diagnosis not present

## 2020-03-24 DIAGNOSIS — I25810 Atherosclerosis of coronary artery bypass graft(s) without angina pectoris: Secondary | ICD-10-CM | POA: Diagnosis not present

## 2020-03-28 DIAGNOSIS — G4733 Obstructive sleep apnea (adult) (pediatric): Secondary | ICD-10-CM | POA: Diagnosis not present

## 2020-04-05 DIAGNOSIS — D492 Neoplasm of unspecified behavior of bone, soft tissue, and skin: Secondary | ICD-10-CM | POA: Diagnosis not present

## 2020-04-05 DIAGNOSIS — B078 Other viral warts: Secondary | ICD-10-CM | POA: Diagnosis not present

## 2020-04-05 DIAGNOSIS — L578 Other skin changes due to chronic exposure to nonionizing radiation: Secondary | ICD-10-CM | POA: Diagnosis not present

## 2020-04-05 DIAGNOSIS — L821 Other seborrheic keratosis: Secondary | ICD-10-CM | POA: Diagnosis not present

## 2020-04-09 DIAGNOSIS — K435 Parastomal hernia without obstruction or  gangrene: Secondary | ICD-10-CM | POA: Diagnosis not present

## 2020-04-22 DIAGNOSIS — E669 Obesity, unspecified: Secondary | ICD-10-CM | POA: Diagnosis not present

## 2020-04-22 DIAGNOSIS — E2740 Unspecified adrenocortical insufficiency: Secondary | ICD-10-CM | POA: Diagnosis not present

## 2020-04-22 DIAGNOSIS — I10 Essential (primary) hypertension: Secondary | ICD-10-CM | POA: Diagnosis not present

## 2020-04-22 DIAGNOSIS — G2581 Restless legs syndrome: Secondary | ICD-10-CM | POA: Diagnosis not present

## 2020-04-22 DIAGNOSIS — E119 Type 2 diabetes mellitus without complications: Secondary | ICD-10-CM | POA: Diagnosis not present

## 2020-04-22 DIAGNOSIS — I480 Paroxysmal atrial fibrillation: Secondary | ICD-10-CM | POA: Diagnosis not present

## 2020-04-22 DIAGNOSIS — E538 Deficiency of other specified B group vitamins: Secondary | ICD-10-CM | POA: Diagnosis not present

## 2020-04-22 DIAGNOSIS — Z23 Encounter for immunization: Secondary | ICD-10-CM | POA: Diagnosis not present

## 2020-04-22 DIAGNOSIS — Z13820 Encounter for screening for osteoporosis: Secondary | ICD-10-CM | POA: Diagnosis not present

## 2020-04-23 DIAGNOSIS — E669 Obesity, unspecified: Secondary | ICD-10-CM | POA: Diagnosis not present

## 2020-04-23 DIAGNOSIS — E1169 Type 2 diabetes mellitus with other specified complication: Secondary | ICD-10-CM | POA: Diagnosis not present

## 2020-04-23 DIAGNOSIS — E271 Primary adrenocortical insufficiency: Secondary | ICD-10-CM | POA: Diagnosis not present

## 2020-04-23 DIAGNOSIS — E039 Hypothyroidism, unspecified: Secondary | ICD-10-CM | POA: Diagnosis not present

## 2020-04-26 DIAGNOSIS — Z933 Colostomy status: Secondary | ICD-10-CM | POA: Diagnosis not present

## 2020-04-26 DIAGNOSIS — K5900 Constipation, unspecified: Secondary | ICD-10-CM | POA: Diagnosis not present

## 2020-04-26 DIAGNOSIS — I10 Essential (primary) hypertension: Secondary | ICD-10-CM | POA: Diagnosis not present

## 2020-04-26 DIAGNOSIS — Z7901 Long term (current) use of anticoagulants: Secondary | ICD-10-CM | POA: Diagnosis not present

## 2020-04-26 DIAGNOSIS — Z7952 Long term (current) use of systemic steroids: Secondary | ICD-10-CM | POA: Diagnosis not present

## 2020-04-28 DIAGNOSIS — G4733 Obstructive sleep apnea (adult) (pediatric): Secondary | ICD-10-CM | POA: Diagnosis not present

## 2020-05-10 DIAGNOSIS — Z20822 Contact with and (suspected) exposure to covid-19: Secondary | ICD-10-CM | POA: Diagnosis not present

## 2020-05-10 DIAGNOSIS — Z01812 Encounter for preprocedural laboratory examination: Secondary | ICD-10-CM | POA: Diagnosis not present

## 2020-05-11 DIAGNOSIS — Z01810 Encounter for preprocedural cardiovascular examination: Secondary | ICD-10-CM | POA: Diagnosis not present

## 2020-05-11 DIAGNOSIS — I2510 Atherosclerotic heart disease of native coronary artery without angina pectoris: Secondary | ICD-10-CM | POA: Diagnosis not present

## 2020-05-13 DIAGNOSIS — E785 Hyperlipidemia, unspecified: Secondary | ICD-10-CM | POA: Diagnosis not present

## 2020-05-13 DIAGNOSIS — F419 Anxiety disorder, unspecified: Secondary | ICD-10-CM | POA: Diagnosis not present

## 2020-05-13 DIAGNOSIS — G4730 Sleep apnea, unspecified: Secondary | ICD-10-CM | POA: Diagnosis not present

## 2020-05-13 DIAGNOSIS — M47816 Spondylosis without myelopathy or radiculopathy, lumbar region: Secondary | ICD-10-CM | POA: Diagnosis not present

## 2020-05-13 DIAGNOSIS — Z006 Encounter for examination for normal comparison and control in clinical research program: Secondary | ICD-10-CM | POA: Diagnosis not present

## 2020-05-13 DIAGNOSIS — G894 Chronic pain syndrome: Secondary | ICD-10-CM | POA: Diagnosis not present

## 2020-05-13 DIAGNOSIS — M48062 Spinal stenosis, lumbar region with neurogenic claudication: Secondary | ICD-10-CM | POA: Diagnosis not present

## 2020-05-13 DIAGNOSIS — F32A Depression, unspecified: Secondary | ICD-10-CM | POA: Diagnosis not present

## 2020-05-13 DIAGNOSIS — I480 Paroxysmal atrial fibrillation: Secondary | ICD-10-CM | POA: Diagnosis not present

## 2020-05-13 DIAGNOSIS — I2510 Atherosclerotic heart disease of native coronary artery without angina pectoris: Secondary | ICD-10-CM | POA: Diagnosis not present

## 2020-05-13 DIAGNOSIS — E271 Primary adrenocortical insufficiency: Secondary | ICD-10-CM | POA: Diagnosis not present

## 2020-05-13 DIAGNOSIS — M1990 Unspecified osteoarthritis, unspecified site: Secondary | ICD-10-CM | POA: Diagnosis not present

## 2020-05-13 DIAGNOSIS — E039 Hypothyroidism, unspecified: Secondary | ICD-10-CM | POA: Diagnosis not present

## 2020-05-13 DIAGNOSIS — Z933 Colostomy status: Secondary | ICD-10-CM | POA: Diagnosis not present

## 2020-05-13 DIAGNOSIS — E119 Type 2 diabetes mellitus without complications: Secondary | ICD-10-CM | POA: Diagnosis not present

## 2020-05-13 DIAGNOSIS — E669 Obesity, unspecified: Secondary | ICD-10-CM | POA: Diagnosis not present

## 2020-05-13 DIAGNOSIS — I10 Essential (primary) hypertension: Secondary | ICD-10-CM | POA: Diagnosis not present

## 2020-05-20 DIAGNOSIS — N321 Vesicointestinal fistula: Secondary | ICD-10-CM | POA: Diagnosis not present

## 2020-05-20 DIAGNOSIS — Z933 Colostomy status: Secondary | ICD-10-CM | POA: Diagnosis not present

## 2020-05-20 DIAGNOSIS — K5732 Diverticulitis of large intestine without perforation or abscess without bleeding: Secondary | ICD-10-CM | POA: Diagnosis not present

## 2020-05-24 DIAGNOSIS — M8589 Other specified disorders of bone density and structure, multiple sites: Secondary | ICD-10-CM | POA: Diagnosis not present

## 2020-05-24 DIAGNOSIS — Z13820 Encounter for screening for osteoporosis: Secondary | ICD-10-CM | POA: Diagnosis not present

## 2020-05-24 DIAGNOSIS — Z780 Asymptomatic menopausal state: Secondary | ICD-10-CM | POA: Diagnosis not present

## 2020-05-26 DIAGNOSIS — I10 Essential (primary) hypertension: Secondary | ICD-10-CM | POA: Diagnosis not present

## 2020-05-26 DIAGNOSIS — F419 Anxiety disorder, unspecified: Secondary | ICD-10-CM | POA: Diagnosis not present

## 2020-05-28 DIAGNOSIS — I10 Essential (primary) hypertension: Secondary | ICD-10-CM | POA: Diagnosis not present

## 2020-05-28 DIAGNOSIS — G8929 Other chronic pain: Secondary | ICD-10-CM | POA: Diagnosis not present

## 2020-05-28 DIAGNOSIS — M545 Low back pain, unspecified: Secondary | ICD-10-CM | POA: Diagnosis not present

## 2020-05-28 DIAGNOSIS — G894 Chronic pain syndrome: Secondary | ICD-10-CM | POA: Diagnosis not present

## 2020-06-03 DIAGNOSIS — E876 Hypokalemia: Secondary | ICD-10-CM | POA: Diagnosis not present

## 2020-06-03 DIAGNOSIS — E271 Primary adrenocortical insufficiency: Secondary | ICD-10-CM | POA: Diagnosis not present

## 2020-06-03 DIAGNOSIS — Z20822 Contact with and (suspected) exposure to covid-19: Secondary | ICD-10-CM | POA: Diagnosis not present

## 2020-06-03 DIAGNOSIS — R109 Unspecified abdominal pain: Secondary | ICD-10-CM | POA: Diagnosis not present

## 2020-06-03 DIAGNOSIS — J189 Pneumonia, unspecified organism: Secondary | ICD-10-CM | POA: Diagnosis not present

## 2020-06-03 DIAGNOSIS — I160 Hypertensive urgency: Secondary | ICD-10-CM | POA: Diagnosis not present

## 2020-06-03 DIAGNOSIS — C480 Malignant neoplasm of retroperitoneum: Secondary | ICD-10-CM | POA: Diagnosis not present

## 2020-06-03 DIAGNOSIS — R0600 Dyspnea, unspecified: Secondary | ICD-10-CM | POA: Diagnosis not present

## 2020-06-03 DIAGNOSIS — R1032 Left lower quadrant pain: Secondary | ICD-10-CM | POA: Diagnosis not present

## 2020-06-03 DIAGNOSIS — K435 Parastomal hernia without obstruction or  gangrene: Secondary | ICD-10-CM | POA: Diagnosis not present

## 2020-06-03 DIAGNOSIS — J918 Pleural effusion in other conditions classified elsewhere: Secondary | ICD-10-CM | POA: Diagnosis not present

## 2020-06-03 DIAGNOSIS — J8489 Other specified interstitial pulmonary diseases: Secondary | ICD-10-CM | POA: Diagnosis not present

## 2020-06-03 DIAGNOSIS — Z6836 Body mass index (BMI) 36.0-36.9, adult: Secondary | ICD-10-CM | POA: Diagnosis not present

## 2020-06-03 DIAGNOSIS — I10 Essential (primary) hypertension: Secondary | ICD-10-CM | POA: Diagnosis not present

## 2020-06-03 DIAGNOSIS — E039 Hypothyroidism, unspecified: Secondary | ICD-10-CM | POA: Diagnosis not present

## 2020-06-03 DIAGNOSIS — E2740 Unspecified adrenocortical insufficiency: Secondary | ICD-10-CM | POA: Diagnosis not present

## 2020-06-03 DIAGNOSIS — R509 Fever, unspecified: Secondary | ICD-10-CM | POA: Diagnosis not present

## 2020-06-03 DIAGNOSIS — A419 Sepsis, unspecified organism: Secondary | ICD-10-CM | POA: Diagnosis not present

## 2020-06-03 DIAGNOSIS — J90 Pleural effusion, not elsewhere classified: Secondary | ICD-10-CM | POA: Diagnosis not present

## 2020-06-03 DIAGNOSIS — E661 Drug-induced obesity: Secondary | ICD-10-CM | POA: Diagnosis not present

## 2020-06-04 DIAGNOSIS — J918 Pleural effusion in other conditions classified elsewhere: Secondary | ICD-10-CM | POA: Diagnosis not present

## 2020-06-04 DIAGNOSIS — K435 Parastomal hernia without obstruction or  gangrene: Secondary | ICD-10-CM | POA: Diagnosis not present

## 2020-06-04 DIAGNOSIS — J90 Pleural effusion, not elsewhere classified: Secondary | ICD-10-CM | POA: Diagnosis not present

## 2020-06-04 DIAGNOSIS — I4891 Unspecified atrial fibrillation: Secondary | ICD-10-CM | POA: Diagnosis not present

## 2020-06-04 DIAGNOSIS — K760 Fatty (change of) liver, not elsewhere classified: Secondary | ICD-10-CM | POA: Diagnosis not present

## 2020-06-04 DIAGNOSIS — E271 Primary adrenocortical insufficiency: Secondary | ICD-10-CM | POA: Diagnosis not present

## 2020-06-04 DIAGNOSIS — E876 Hypokalemia: Secondary | ICD-10-CM | POA: Diagnosis not present

## 2020-06-04 DIAGNOSIS — C480 Malignant neoplasm of retroperitoneum: Secondary | ICD-10-CM | POA: Diagnosis not present

## 2020-06-04 DIAGNOSIS — R918 Other nonspecific abnormal finding of lung field: Secondary | ICD-10-CM | POA: Diagnosis not present

## 2020-06-04 DIAGNOSIS — I160 Hypertensive urgency: Secondary | ICD-10-CM | POA: Diagnosis not present

## 2020-06-04 DIAGNOSIS — J189 Pneumonia, unspecified organism: Secondary | ICD-10-CM | POA: Diagnosis not present

## 2020-06-04 DIAGNOSIS — Z20822 Contact with and (suspected) exposure to covid-19: Secondary | ICD-10-CM | POA: Diagnosis not present

## 2020-06-04 DIAGNOSIS — E039 Hypothyroidism, unspecified: Secondary | ICD-10-CM | POA: Diagnosis not present

## 2020-06-04 DIAGNOSIS — R109 Unspecified abdominal pain: Secondary | ICD-10-CM | POA: Diagnosis not present

## 2020-06-05 DIAGNOSIS — I4891 Unspecified atrial fibrillation: Secondary | ICD-10-CM | POA: Diagnosis not present

## 2020-06-05 DIAGNOSIS — E039 Hypothyroidism, unspecified: Secondary | ICD-10-CM | POA: Diagnosis not present

## 2020-06-05 DIAGNOSIS — E876 Hypokalemia: Secondary | ICD-10-CM | POA: Diagnosis not present

## 2020-06-05 DIAGNOSIS — C480 Malignant neoplasm of retroperitoneum: Secondary | ICD-10-CM | POA: Diagnosis not present

## 2020-06-05 DIAGNOSIS — E271 Primary adrenocortical insufficiency: Secondary | ICD-10-CM | POA: Diagnosis not present

## 2020-06-05 DIAGNOSIS — J189 Pneumonia, unspecified organism: Secondary | ICD-10-CM | POA: Diagnosis not present

## 2020-06-05 DIAGNOSIS — I160 Hypertensive urgency: Secondary | ICD-10-CM | POA: Diagnosis not present

## 2020-06-05 DIAGNOSIS — Z20822 Contact with and (suspected) exposure to covid-19: Secondary | ICD-10-CM | POA: Diagnosis not present

## 2020-06-05 DIAGNOSIS — R109 Unspecified abdominal pain: Secondary | ICD-10-CM | POA: Diagnosis not present

## 2020-06-05 DIAGNOSIS — K435 Parastomal hernia without obstruction or  gangrene: Secondary | ICD-10-CM | POA: Diagnosis not present

## 2020-06-05 DIAGNOSIS — R918 Other nonspecific abnormal finding of lung field: Secondary | ICD-10-CM | POA: Diagnosis not present

## 2020-06-05 DIAGNOSIS — J918 Pleural effusion in other conditions classified elsewhere: Secondary | ICD-10-CM | POA: Diagnosis not present

## 2020-06-06 DIAGNOSIS — E039 Hypothyroidism, unspecified: Secondary | ICD-10-CM | POA: Diagnosis not present

## 2020-06-06 DIAGNOSIS — C480 Malignant neoplasm of retroperitoneum: Secondary | ICD-10-CM | POA: Diagnosis not present

## 2020-06-06 DIAGNOSIS — R109 Unspecified abdominal pain: Secondary | ICD-10-CM | POA: Diagnosis not present

## 2020-06-06 DIAGNOSIS — I080 Rheumatic disorders of both mitral and aortic valves: Secondary | ICD-10-CM | POA: Diagnosis not present

## 2020-06-06 DIAGNOSIS — I160 Hypertensive urgency: Secondary | ICD-10-CM | POA: Diagnosis not present

## 2020-06-06 DIAGNOSIS — R918 Other nonspecific abnormal finding of lung field: Secondary | ICD-10-CM | POA: Diagnosis not present

## 2020-06-06 DIAGNOSIS — J918 Pleural effusion in other conditions classified elsewhere: Secondary | ICD-10-CM | POA: Diagnosis not present

## 2020-06-06 DIAGNOSIS — E876 Hypokalemia: Secondary | ICD-10-CM | POA: Diagnosis not present

## 2020-06-06 DIAGNOSIS — Z20822 Contact with and (suspected) exposure to covid-19: Secondary | ICD-10-CM | POA: Diagnosis not present

## 2020-06-06 DIAGNOSIS — I4891 Unspecified atrial fibrillation: Secondary | ICD-10-CM | POA: Diagnosis not present

## 2020-06-06 DIAGNOSIS — K435 Parastomal hernia without obstruction or  gangrene: Secondary | ICD-10-CM | POA: Diagnosis not present

## 2020-06-06 DIAGNOSIS — E271 Primary adrenocortical insufficiency: Secondary | ICD-10-CM | POA: Diagnosis not present

## 2020-06-06 DIAGNOSIS — J189 Pneumonia, unspecified organism: Secondary | ICD-10-CM | POA: Diagnosis not present

## 2020-06-07 DIAGNOSIS — J189 Pneumonia, unspecified organism: Secondary | ICD-10-CM | POA: Diagnosis not present

## 2020-06-07 DIAGNOSIS — R109 Unspecified abdominal pain: Secondary | ICD-10-CM | POA: Diagnosis not present

## 2020-06-07 DIAGNOSIS — C480 Malignant neoplasm of retroperitoneum: Secondary | ICD-10-CM | POA: Diagnosis not present

## 2020-06-07 DIAGNOSIS — E876 Hypokalemia: Secondary | ICD-10-CM | POA: Diagnosis not present

## 2020-06-07 DIAGNOSIS — E271 Primary adrenocortical insufficiency: Secondary | ICD-10-CM | POA: Diagnosis not present

## 2020-06-07 DIAGNOSIS — K435 Parastomal hernia without obstruction or  gangrene: Secondary | ICD-10-CM | POA: Diagnosis not present

## 2020-06-07 DIAGNOSIS — Z20822 Contact with and (suspected) exposure to covid-19: Secondary | ICD-10-CM | POA: Diagnosis not present

## 2020-06-07 DIAGNOSIS — J918 Pleural effusion in other conditions classified elsewhere: Secondary | ICD-10-CM | POA: Diagnosis not present

## 2020-06-07 DIAGNOSIS — I4891 Unspecified atrial fibrillation: Secondary | ICD-10-CM | POA: Diagnosis not present

## 2020-06-07 DIAGNOSIS — E039 Hypothyroidism, unspecified: Secondary | ICD-10-CM | POA: Diagnosis not present

## 2020-06-07 DIAGNOSIS — R918 Other nonspecific abnormal finding of lung field: Secondary | ICD-10-CM | POA: Diagnosis not present

## 2020-06-07 DIAGNOSIS — I160 Hypertensive urgency: Secondary | ICD-10-CM | POA: Diagnosis not present

## 2020-06-17 DIAGNOSIS — E271 Primary adrenocortical insufficiency: Secondary | ICD-10-CM | POA: Diagnosis not present

## 2020-06-17 DIAGNOSIS — M8589 Other specified disorders of bone density and structure, multiple sites: Secondary | ICD-10-CM | POA: Diagnosis not present

## 2020-06-17 DIAGNOSIS — M80811D Other osteoporosis with current pathological fracture, right shoulder, subsequent encounter for fracture with routine healing: Secondary | ICD-10-CM | POA: Diagnosis not present

## 2020-06-22 DIAGNOSIS — K5732 Diverticulitis of large intestine without perforation or abscess without bleeding: Secondary | ICD-10-CM | POA: Diagnosis not present

## 2020-06-22 DIAGNOSIS — Z933 Colostomy status: Secondary | ICD-10-CM | POA: Diagnosis not present

## 2020-06-22 DIAGNOSIS — N321 Vesicointestinal fistula: Secondary | ICD-10-CM | POA: Diagnosis not present

## 2020-06-25 DIAGNOSIS — M8589 Other specified disorders of bone density and structure, multiple sites: Secondary | ICD-10-CM | POA: Diagnosis not present

## 2020-06-25 DIAGNOSIS — M80819D Other osteoporosis with current pathological fracture, unspecified shoulder, subsequent encounter for fracture with routine healing: Secondary | ICD-10-CM | POA: Diagnosis not present

## 2020-06-28 DIAGNOSIS — E785 Hyperlipidemia, unspecified: Secondary | ICD-10-CM | POA: Diagnosis not present

## 2020-06-28 DIAGNOSIS — M545 Low back pain, unspecified: Secondary | ICD-10-CM | POA: Diagnosis not present

## 2020-06-28 DIAGNOSIS — E271 Primary adrenocortical insufficiency: Secondary | ICD-10-CM | POA: Diagnosis not present

## 2020-06-28 DIAGNOSIS — K219 Gastro-esophageal reflux disease without esophagitis: Secondary | ICD-10-CM | POA: Diagnosis not present

## 2020-06-28 DIAGNOSIS — G894 Chronic pain syndrome: Secondary | ICD-10-CM | POA: Diagnosis not present

## 2020-06-28 DIAGNOSIS — G8929 Other chronic pain: Secondary | ICD-10-CM | POA: Diagnosis not present

## 2020-06-28 DIAGNOSIS — G589 Mononeuropathy, unspecified: Secondary | ICD-10-CM | POA: Diagnosis not present

## 2020-06-28 DIAGNOSIS — I2510 Atherosclerotic heart disease of native coronary artery without angina pectoris: Secondary | ICD-10-CM | POA: Diagnosis not present

## 2020-06-28 DIAGNOSIS — E039 Hypothyroidism, unspecified: Secondary | ICD-10-CM | POA: Diagnosis not present

## 2020-06-28 DIAGNOSIS — I4891 Unspecified atrial fibrillation: Secondary | ICD-10-CM | POA: Diagnosis not present

## 2020-07-09 IMAGING — CT CT FOREARM*R* W/O CM
3 series · 16 of 33 positions shown, 19 images · non-contrast
Comparison: None.

CLINICAL DATA: Bruising and swelling of the anterior forearm. No
recalled acute injury.

EXAM:
CT OF THE RIGHT FOREARM WITHOUT CONTRAST
TECHNIQUE: Multidetector CT imaging was performed according to the standard
protocol. Multiplanar CT image reconstructions were also generated.

[Series 6: lfov ext 3.0 i31s 2 · axial · 0.64mm/px · z∈[-256,-158]mm · 8 of 40 slices shown, 10 images]
[im 4/40  soft-tissue]
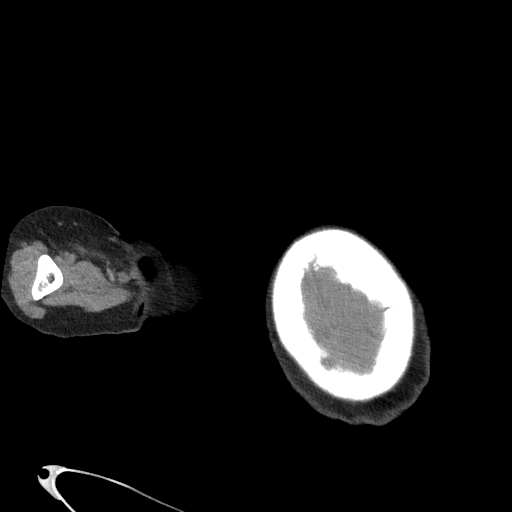
[im 4/40  bone]
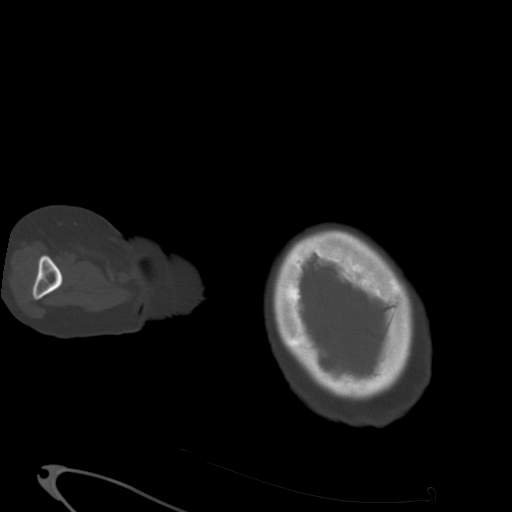
[im 10/40  bone]
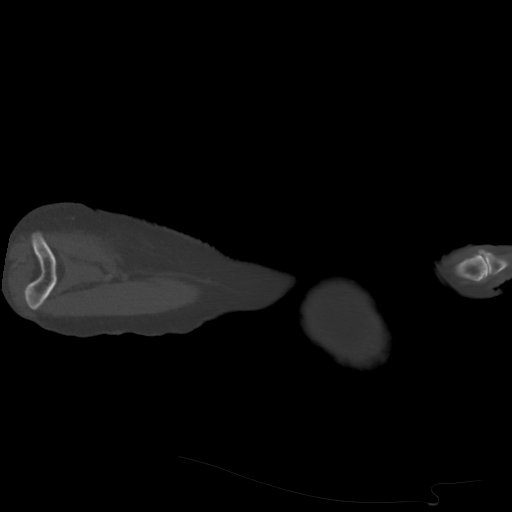
[im 13/40  bone]
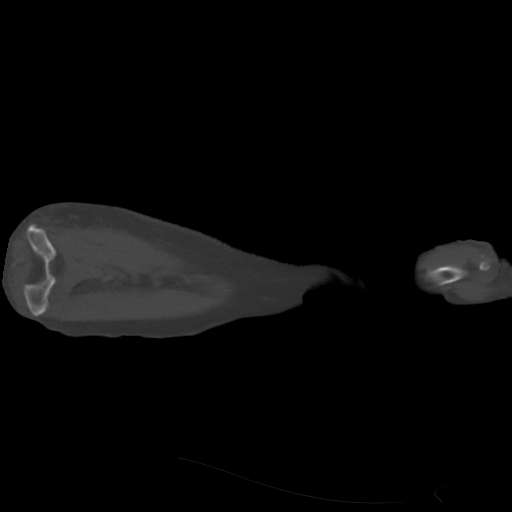
[im 19/40  bone]
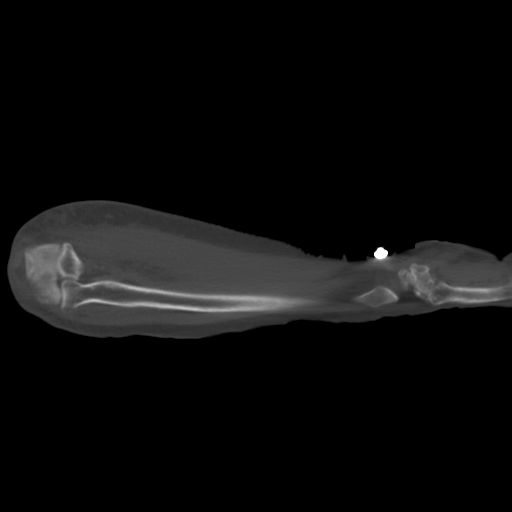
[im 22/40  soft-tissue]
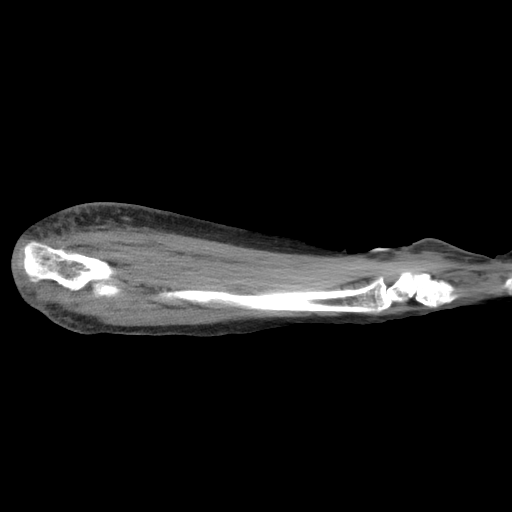
[im 22/40  bone]
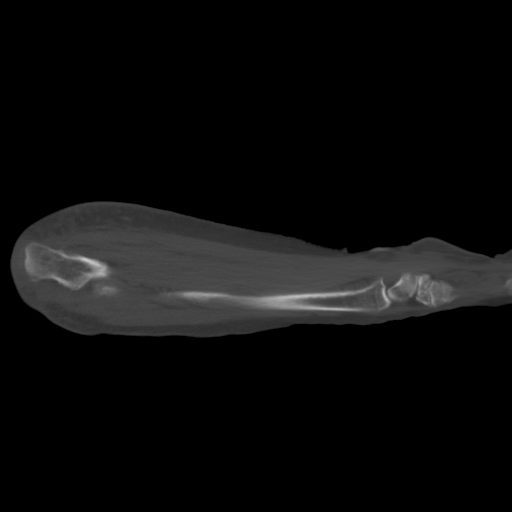
[im 28/40  bone]
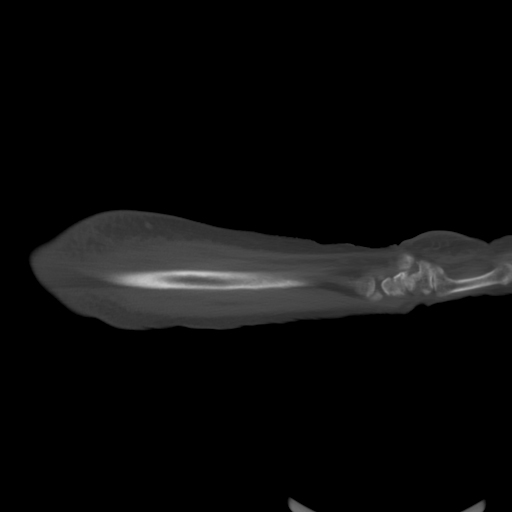
[im 31/40  bone]
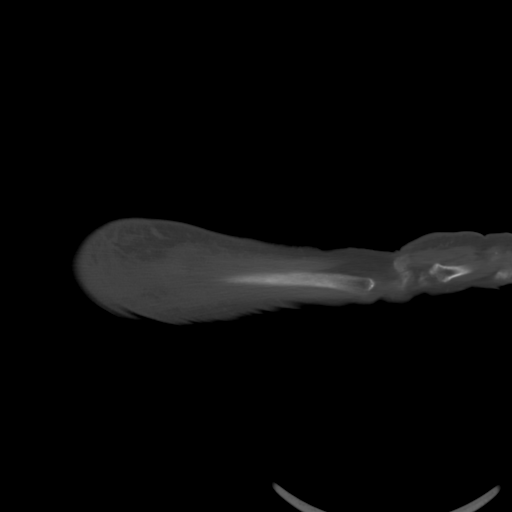
[im 37/40  bone]
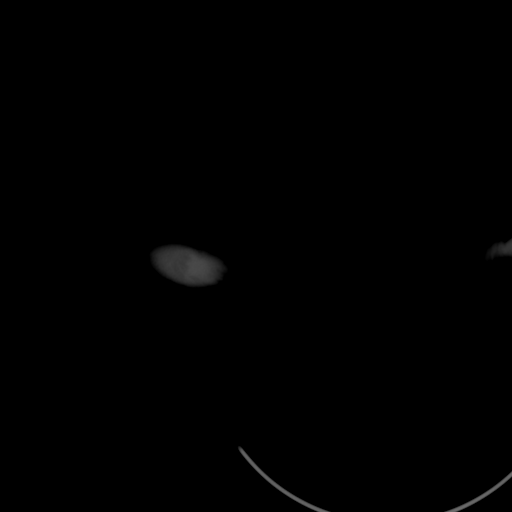

[Series 604: axial st · sagittal · 0.64mm/px · 5 of 141 slices shown, 6 images]
[im 47/141  bone]
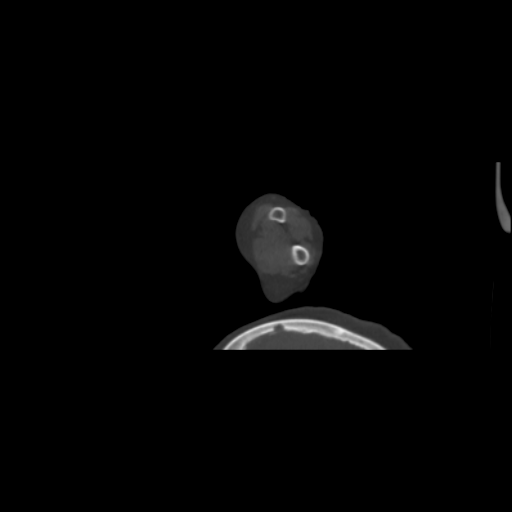
[im 59/141  bone]
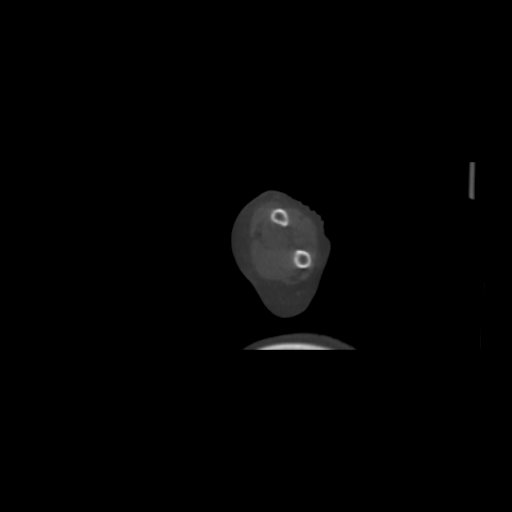
[im 71/141  soft-tissue]
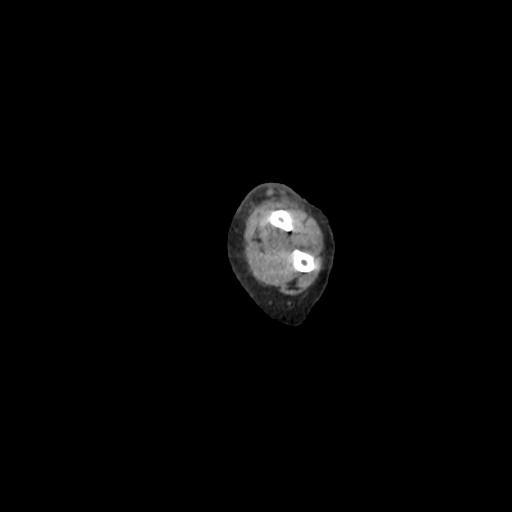
[im 71/141  bone]
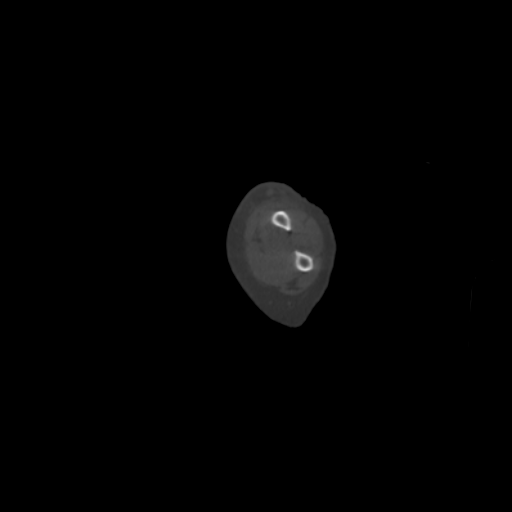
[im 82/141  bone]
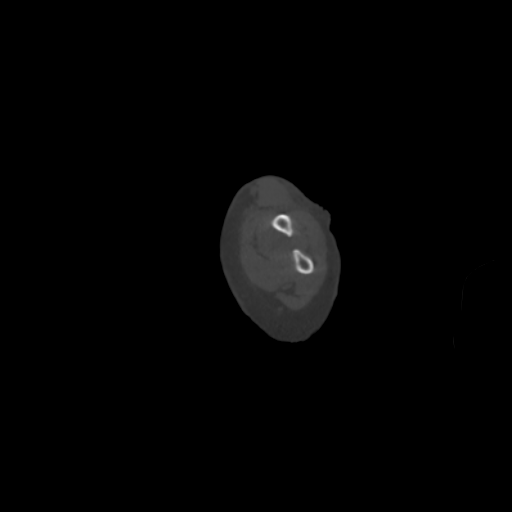
[im 94/141  bone]
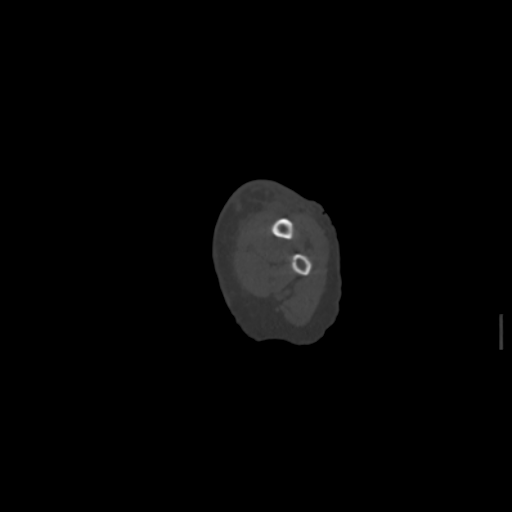

[Series 605: st coronal · coronal · 0.64mm/px · 3 of 46 slices shown]
[im 10/46  bone]
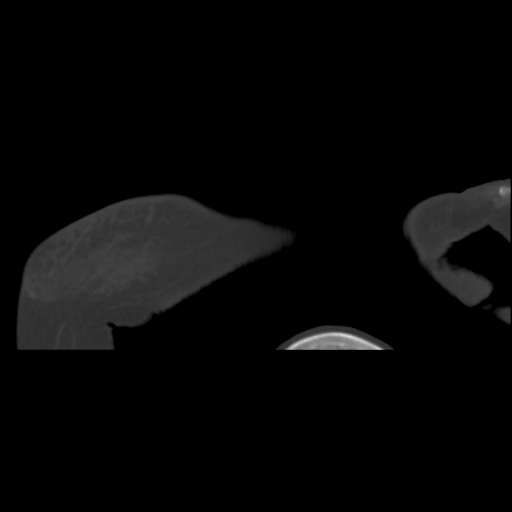
[im 19/46  bone]
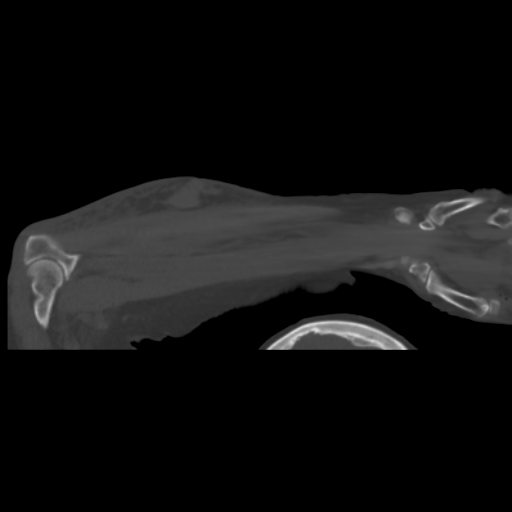
[im 28/46  bone]
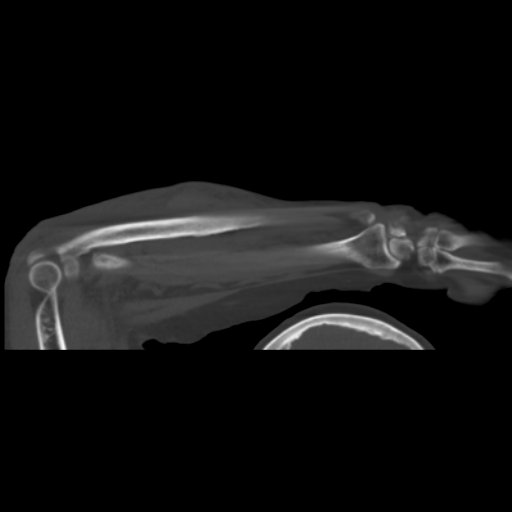

[16 of 33 positions shown; findings below may reference images not displayed]

FINDINGS: Bones/Joint/Cartilage

No evidence of acute fracture, dislocation or bone destruction. Mild
degenerative changes within the wrist. Tiny ossific density dorsal
to the lunate, best seen on the sagittal images, probably not acute.
No significant elbow joint effusion.

Ligaments

Suboptimally assessed by CT.

Muscles and Tendons

No gross muscle or tendon abnormality.

Soft tissues

In the subcutaneous fat posterior to the mid ulna, there is a
hyperdense mass measuring 3.7 x 2.7 x 1.6 cm, presumably a hematoma.
There is surrounding soft tissue swelling. No evidence of foreign
body or soft tissue emphysema.
IMPRESSION: 1. Hyperdense mass posterior to the mid ulna, presumably a hematoma.
No evidence of foreign body or soft tissue emphysema. Clinical
follow-up recommended to ensure resolution and exclude mass lesion.
2. No evidence of acute fracture, dislocation or bone destruction.

## 2020-07-26 DIAGNOSIS — R1900 Intra-abdominal and pelvic swelling, mass and lump, unspecified site: Secondary | ICD-10-CM | POA: Diagnosis not present

## 2020-07-26 DIAGNOSIS — Z933 Colostomy status: Secondary | ICD-10-CM | POA: Diagnosis not present

## 2020-07-26 DIAGNOSIS — I10 Essential (primary) hypertension: Secondary | ICD-10-CM | POA: Diagnosis not present

## 2020-07-26 DIAGNOSIS — K435 Parastomal hernia without obstruction or  gangrene: Secondary | ICD-10-CM | POA: Diagnosis not present

## 2020-07-27 DIAGNOSIS — G8929 Other chronic pain: Secondary | ICD-10-CM | POA: Diagnosis not present

## 2020-07-27 DIAGNOSIS — I10 Essential (primary) hypertension: Secondary | ICD-10-CM | POA: Diagnosis not present

## 2020-07-27 DIAGNOSIS — G894 Chronic pain syndrome: Secondary | ICD-10-CM | POA: Diagnosis not present

## 2020-07-27 DIAGNOSIS — M545 Low back pain, unspecified: Secondary | ICD-10-CM | POA: Diagnosis not present

## 2020-07-30 DIAGNOSIS — I10 Essential (primary) hypertension: Secondary | ICD-10-CM | POA: Diagnosis not present

## 2020-07-30 DIAGNOSIS — M47816 Spondylosis without myelopathy or radiculopathy, lumbar region: Secondary | ICD-10-CM | POA: Diagnosis not present

## 2020-07-30 DIAGNOSIS — E271 Primary adrenocortical insufficiency: Secondary | ICD-10-CM | POA: Diagnosis not present

## 2020-07-30 DIAGNOSIS — Z933 Colostomy status: Secondary | ICD-10-CM | POA: Diagnosis not present

## 2020-07-30 DIAGNOSIS — I480 Paroxysmal atrial fibrillation: Secondary | ICD-10-CM | POA: Diagnosis not present

## 2020-07-30 DIAGNOSIS — Z7901 Long term (current) use of anticoagulants: Secondary | ICD-10-CM | POA: Diagnosis not present

## 2020-07-30 DIAGNOSIS — R1900 Intra-abdominal and pelvic swelling, mass and lump, unspecified site: Secondary | ICD-10-CM | POA: Diagnosis not present

## 2020-07-30 DIAGNOSIS — K435 Parastomal hernia without obstruction or  gangrene: Secondary | ICD-10-CM | POA: Diagnosis not present

## 2020-08-06 DIAGNOSIS — I10 Essential (primary) hypertension: Secondary | ICD-10-CM | POA: Diagnosis not present

## 2020-08-06 DIAGNOSIS — E1169 Type 2 diabetes mellitus with other specified complication: Secondary | ICD-10-CM | POA: Diagnosis not present

## 2020-08-06 DIAGNOSIS — E271 Primary adrenocortical insufficiency: Secondary | ICD-10-CM | POA: Diagnosis not present

## 2020-08-06 DIAGNOSIS — E039 Hypothyroidism, unspecified: Secondary | ICD-10-CM | POA: Diagnosis not present

## 2020-08-06 DIAGNOSIS — E669 Obesity, unspecified: Secondary | ICD-10-CM | POA: Diagnosis not present

## 2020-08-06 DIAGNOSIS — D124 Benign neoplasm of descending colon: Secondary | ICD-10-CM | POA: Diagnosis not present

## 2020-08-06 DIAGNOSIS — M810 Age-related osteoporosis without current pathological fracture: Secondary | ICD-10-CM | POA: Diagnosis not present

## 2020-08-06 DIAGNOSIS — K5730 Diverticulosis of large intestine without perforation or abscess without bleeding: Secondary | ICD-10-CM | POA: Diagnosis not present

## 2020-08-16 DIAGNOSIS — I4811 Longstanding persistent atrial fibrillation: Secondary | ICD-10-CM | POA: Diagnosis not present

## 2020-08-16 DIAGNOSIS — E039 Hypothyroidism, unspecified: Secondary | ICD-10-CM | POA: Diagnosis not present

## 2020-08-16 DIAGNOSIS — I10 Essential (primary) hypertension: Secondary | ICD-10-CM | POA: Diagnosis not present

## 2020-08-16 DIAGNOSIS — Z9229 Personal history of other drug therapy: Secondary | ICD-10-CM | POA: Diagnosis not present

## 2020-08-26 DIAGNOSIS — G894 Chronic pain syndrome: Secondary | ICD-10-CM | POA: Diagnosis not present

## 2020-08-26 DIAGNOSIS — M47816 Spondylosis without myelopathy or radiculopathy, lumbar region: Secondary | ICD-10-CM | POA: Diagnosis not present

## 2020-08-26 DIAGNOSIS — I10 Essential (primary) hypertension: Secondary | ICD-10-CM | POA: Diagnosis not present

## 2020-08-26 DIAGNOSIS — M545 Low back pain, unspecified: Secondary | ICD-10-CM | POA: Diagnosis not present

## 2020-08-26 DIAGNOSIS — G8929 Other chronic pain: Secondary | ICD-10-CM | POA: Diagnosis not present

## 2020-08-27 DIAGNOSIS — E568 Deficiency of other vitamins: Secondary | ICD-10-CM | POA: Diagnosis not present

## 2020-08-27 DIAGNOSIS — E271 Primary adrenocortical insufficiency: Secondary | ICD-10-CM | POA: Diagnosis not present

## 2020-08-27 DIAGNOSIS — I480 Paroxysmal atrial fibrillation: Secondary | ICD-10-CM | POA: Diagnosis not present

## 2020-08-27 DIAGNOSIS — M48061 Spinal stenosis, lumbar region without neurogenic claudication: Secondary | ICD-10-CM | POA: Diagnosis not present

## 2020-08-31 DIAGNOSIS — R112 Nausea with vomiting, unspecified: Secondary | ICD-10-CM | POA: Diagnosis not present

## 2020-08-31 DIAGNOSIS — I361 Nonrheumatic tricuspid (valve) insufficiency: Secondary | ICD-10-CM | POA: Diagnosis not present

## 2020-08-31 DIAGNOSIS — E876 Hypokalemia: Secondary | ICD-10-CM | POA: Diagnosis not present

## 2020-08-31 DIAGNOSIS — R079 Chest pain, unspecified: Secondary | ICD-10-CM | POA: Diagnosis not present

## 2020-08-31 DIAGNOSIS — Z933 Colostomy status: Secondary | ICD-10-CM | POA: Diagnosis not present

## 2020-08-31 DIAGNOSIS — I509 Heart failure, unspecified: Secondary | ICD-10-CM | POA: Diagnosis not present

## 2020-08-31 DIAGNOSIS — R270 Ataxia, unspecified: Secondary | ICD-10-CM | POA: Diagnosis not present

## 2020-08-31 DIAGNOSIS — R42 Dizziness and giddiness: Secondary | ICD-10-CM | POA: Diagnosis not present

## 2020-08-31 DIAGNOSIS — G4733 Obstructive sleep apnea (adult) (pediatric): Secondary | ICD-10-CM | POA: Diagnosis not present

## 2020-08-31 DIAGNOSIS — E2740 Unspecified adrenocortical insufficiency: Secondary | ICD-10-CM | POA: Diagnosis not present

## 2020-08-31 DIAGNOSIS — I480 Paroxysmal atrial fibrillation: Secondary | ICD-10-CM | POA: Diagnosis not present

## 2020-08-31 DIAGNOSIS — I2510 Atherosclerotic heart disease of native coronary artery without angina pectoris: Secondary | ICD-10-CM | POA: Diagnosis not present

## 2020-08-31 DIAGNOSIS — I483 Typical atrial flutter: Secondary | ICD-10-CM | POA: Diagnosis not present

## 2020-08-31 DIAGNOSIS — E271 Primary adrenocortical insufficiency: Secondary | ICD-10-CM | POA: Diagnosis not present

## 2020-08-31 DIAGNOSIS — I110 Hypertensive heart disease with heart failure: Secondary | ICD-10-CM | POA: Diagnosis not present

## 2020-08-31 DIAGNOSIS — I272 Pulmonary hypertension, unspecified: Secondary | ICD-10-CM | POA: Diagnosis not present

## 2020-08-31 DIAGNOSIS — R001 Bradycardia, unspecified: Secondary | ICD-10-CM | POA: Diagnosis not present

## 2020-08-31 DIAGNOSIS — D649 Anemia, unspecified: Secondary | ICD-10-CM | POA: Diagnosis not present

## 2020-08-31 DIAGNOSIS — I5032 Chronic diastolic (congestive) heart failure: Secondary | ICD-10-CM | POA: Diagnosis not present

## 2020-08-31 DIAGNOSIS — I10 Essential (primary) hypertension: Secondary | ICD-10-CM | POA: Diagnosis not present

## 2020-08-31 DIAGNOSIS — Z7901 Long term (current) use of anticoagulants: Secondary | ICD-10-CM | POA: Diagnosis not present

## 2020-09-01 DIAGNOSIS — R531 Weakness: Secondary | ICD-10-CM | POA: Diagnosis not present

## 2020-09-01 DIAGNOSIS — I160 Hypertensive urgency: Secondary | ICD-10-CM | POA: Diagnosis not present

## 2020-09-01 DIAGNOSIS — R0600 Dyspnea, unspecified: Secondary | ICD-10-CM | POA: Diagnosis not present

## 2020-09-01 DIAGNOSIS — R001 Bradycardia, unspecified: Secondary | ICD-10-CM | POA: Diagnosis not present

## 2020-09-01 DIAGNOSIS — I480 Paroxysmal atrial fibrillation: Secondary | ICD-10-CM | POA: Diagnosis not present

## 2020-09-01 DIAGNOSIS — M47896 Other spondylosis, lumbar region: Secondary | ICD-10-CM | POA: Diagnosis not present

## 2020-09-01 DIAGNOSIS — Z7952 Long term (current) use of systemic steroids: Secondary | ICD-10-CM | POA: Diagnosis not present

## 2020-09-01 DIAGNOSIS — R42 Dizziness and giddiness: Secondary | ICD-10-CM | POA: Diagnosis not present

## 2020-09-01 DIAGNOSIS — R9431 Abnormal electrocardiogram [ECG] [EKG]: Secondary | ICD-10-CM | POA: Diagnosis not present

## 2020-09-01 DIAGNOSIS — I509 Heart failure, unspecified: Secondary | ICD-10-CM | POA: Diagnosis not present

## 2020-09-01 DIAGNOSIS — G4733 Obstructive sleep apnea (adult) (pediatric): Secondary | ICD-10-CM | POA: Diagnosis not present

## 2020-09-01 DIAGNOSIS — Z7901 Long term (current) use of anticoagulants: Secondary | ICD-10-CM | POA: Diagnosis not present

## 2020-09-01 DIAGNOSIS — R270 Ataxia, unspecified: Secondary | ICD-10-CM | POA: Diagnosis not present

## 2020-09-01 DIAGNOSIS — E876 Hypokalemia: Secondary | ICD-10-CM | POA: Diagnosis not present

## 2020-09-02 DIAGNOSIS — R001 Bradycardia, unspecified: Secondary | ICD-10-CM | POA: Diagnosis not present

## 2020-09-02 DIAGNOSIS — G4733 Obstructive sleep apnea (adult) (pediatric): Secondary | ICD-10-CM | POA: Diagnosis not present

## 2020-09-02 DIAGNOSIS — R531 Weakness: Secondary | ICD-10-CM | POA: Diagnosis not present

## 2020-09-02 DIAGNOSIS — Z7952 Long term (current) use of systemic steroids: Secondary | ICD-10-CM | POA: Diagnosis not present

## 2020-09-02 DIAGNOSIS — R9431 Abnormal electrocardiogram [ECG] [EKG]: Secondary | ICD-10-CM | POA: Diagnosis not present

## 2020-09-02 DIAGNOSIS — R0600 Dyspnea, unspecified: Secondary | ICD-10-CM | POA: Diagnosis not present

## 2020-09-02 DIAGNOSIS — I509 Heart failure, unspecified: Secondary | ICD-10-CM | POA: Diagnosis not present

## 2020-09-02 DIAGNOSIS — E876 Hypokalemia: Secondary | ICD-10-CM | POA: Diagnosis not present

## 2020-09-02 DIAGNOSIS — I160 Hypertensive urgency: Secondary | ICD-10-CM | POA: Diagnosis not present

## 2020-09-02 DIAGNOSIS — I480 Paroxysmal atrial fibrillation: Secondary | ICD-10-CM | POA: Diagnosis not present

## 2020-09-03 DIAGNOSIS — R531 Weakness: Secondary | ICD-10-CM | POA: Diagnosis not present

## 2020-09-03 DIAGNOSIS — E1165 Type 2 diabetes mellitus with hyperglycemia: Secondary | ICD-10-CM | POA: Diagnosis not present

## 2020-09-03 DIAGNOSIS — I480 Paroxysmal atrial fibrillation: Secondary | ICD-10-CM | POA: Diagnosis not present

## 2020-09-03 DIAGNOSIS — R001 Bradycardia, unspecified: Secondary | ICD-10-CM | POA: Diagnosis not present

## 2020-09-03 DIAGNOSIS — I4819 Other persistent atrial fibrillation: Secondary | ICD-10-CM | POA: Diagnosis not present

## 2020-09-03 DIAGNOSIS — I509 Heart failure, unspecified: Secondary | ICD-10-CM | POA: Diagnosis not present

## 2020-09-03 DIAGNOSIS — Z933 Colostomy status: Secondary | ICD-10-CM | POA: Diagnosis not present

## 2020-09-03 DIAGNOSIS — G4733 Obstructive sleep apnea (adult) (pediatric): Secondary | ICD-10-CM | POA: Diagnosis not present

## 2020-09-03 DIAGNOSIS — R1902 Left upper quadrant abdominal swelling, mass and lump: Secondary | ICD-10-CM | POA: Diagnosis not present

## 2020-09-03 DIAGNOSIS — C499 Malignant neoplasm of connective and soft tissue, unspecified: Secondary | ICD-10-CM | POA: Diagnosis not present

## 2020-09-03 DIAGNOSIS — E271 Primary adrenocortical insufficiency: Secondary | ICD-10-CM | POA: Diagnosis not present

## 2020-09-03 DIAGNOSIS — Z7901 Long term (current) use of anticoagulants: Secondary | ICD-10-CM | POA: Diagnosis not present

## 2020-09-03 DIAGNOSIS — E876 Hypokalemia: Secondary | ICD-10-CM | POA: Diagnosis not present

## 2020-09-04 DIAGNOSIS — R531 Weakness: Secondary | ICD-10-CM | POA: Diagnosis not present

## 2020-09-04 DIAGNOSIS — G4733 Obstructive sleep apnea (adult) (pediatric): Secondary | ICD-10-CM | POA: Diagnosis not present

## 2020-09-04 DIAGNOSIS — R001 Bradycardia, unspecified: Secondary | ICD-10-CM | POA: Diagnosis not present

## 2020-09-04 DIAGNOSIS — E1165 Type 2 diabetes mellitus with hyperglycemia: Secondary | ICD-10-CM | POA: Diagnosis not present

## 2020-09-04 DIAGNOSIS — I509 Heart failure, unspecified: Secondary | ICD-10-CM | POA: Diagnosis not present

## 2020-09-04 DIAGNOSIS — C499 Malignant neoplasm of connective and soft tissue, unspecified: Secondary | ICD-10-CM | POA: Diagnosis not present

## 2020-09-04 DIAGNOSIS — I4819 Other persistent atrial fibrillation: Secondary | ICD-10-CM | POA: Diagnosis not present

## 2020-09-04 DIAGNOSIS — Z933 Colostomy status: Secondary | ICD-10-CM | POA: Diagnosis not present

## 2020-09-04 DIAGNOSIS — Z7901 Long term (current) use of anticoagulants: Secondary | ICD-10-CM | POA: Diagnosis not present

## 2020-09-05 DIAGNOSIS — I509 Heart failure, unspecified: Secondary | ICD-10-CM | POA: Diagnosis not present

## 2020-09-05 DIAGNOSIS — G4733 Obstructive sleep apnea (adult) (pediatric): Secondary | ICD-10-CM | POA: Diagnosis not present

## 2020-09-05 DIAGNOSIS — Z7901 Long term (current) use of anticoagulants: Secondary | ICD-10-CM | POA: Diagnosis not present

## 2020-09-05 DIAGNOSIS — C499 Malignant neoplasm of connective and soft tissue, unspecified: Secondary | ICD-10-CM | POA: Diagnosis not present

## 2020-09-05 DIAGNOSIS — E1165 Type 2 diabetes mellitus with hyperglycemia: Secondary | ICD-10-CM | POA: Diagnosis not present

## 2020-09-05 DIAGNOSIS — R001 Bradycardia, unspecified: Secondary | ICD-10-CM | POA: Diagnosis not present

## 2020-09-05 DIAGNOSIS — Z933 Colostomy status: Secondary | ICD-10-CM | POA: Diagnosis not present

## 2020-09-05 DIAGNOSIS — I4819 Other persistent atrial fibrillation: Secondary | ICD-10-CM | POA: Diagnosis not present

## 2020-09-05 DIAGNOSIS — R531 Weakness: Secondary | ICD-10-CM | POA: Diagnosis not present

## 2020-09-06 DIAGNOSIS — Z933 Colostomy status: Secondary | ICD-10-CM | POA: Diagnosis not present

## 2020-09-06 DIAGNOSIS — C499 Malignant neoplasm of connective and soft tissue, unspecified: Secondary | ICD-10-CM | POA: Diagnosis not present

## 2020-09-06 DIAGNOSIS — E271 Primary adrenocortical insufficiency: Secondary | ICD-10-CM | POA: Diagnosis not present

## 2020-09-06 DIAGNOSIS — I160 Hypertensive urgency: Secondary | ICD-10-CM | POA: Diagnosis not present

## 2020-09-06 DIAGNOSIS — Z9889 Other specified postprocedural states: Secondary | ICD-10-CM | POA: Diagnosis not present

## 2020-09-06 DIAGNOSIS — E039 Hypothyroidism, unspecified: Secondary | ICD-10-CM | POA: Diagnosis not present

## 2020-09-06 DIAGNOSIS — E785 Hyperlipidemia, unspecified: Secondary | ICD-10-CM | POA: Diagnosis not present

## 2020-09-06 DIAGNOSIS — I4891 Unspecified atrial fibrillation: Secondary | ICD-10-CM | POA: Diagnosis not present

## 2020-09-06 DIAGNOSIS — R001 Bradycardia, unspecified: Secondary | ICD-10-CM | POA: Diagnosis not present

## 2020-09-06 DIAGNOSIS — I2510 Atherosclerotic heart disease of native coronary artery without angina pectoris: Secondary | ICD-10-CM | POA: Diagnosis not present

## 2020-09-06 DIAGNOSIS — E876 Hypokalemia: Secondary | ICD-10-CM | POA: Diagnosis not present

## 2020-09-06 DIAGNOSIS — R531 Weakness: Secondary | ICD-10-CM | POA: Diagnosis not present

## 2020-09-06 DIAGNOSIS — G4733 Obstructive sleep apnea (adult) (pediatric): Secondary | ICD-10-CM | POA: Diagnosis not present

## 2020-09-06 DIAGNOSIS — I4819 Other persistent atrial fibrillation: Secondary | ICD-10-CM | POA: Diagnosis not present

## 2020-09-06 DIAGNOSIS — I509 Heart failure, unspecified: Secondary | ICD-10-CM | POA: Diagnosis not present

## 2020-09-06 DIAGNOSIS — Z7952 Long term (current) use of systemic steroids: Secondary | ICD-10-CM | POA: Diagnosis not present

## 2020-09-06 DIAGNOSIS — E119 Type 2 diabetes mellitus without complications: Secondary | ICD-10-CM | POA: Diagnosis not present

## 2020-09-06 DIAGNOSIS — I480 Paroxysmal atrial fibrillation: Secondary | ICD-10-CM | POA: Diagnosis not present

## 2020-09-06 DIAGNOSIS — R0600 Dyspnea, unspecified: Secondary | ICD-10-CM | POA: Diagnosis not present

## 2020-09-06 DIAGNOSIS — Z7901 Long term (current) use of anticoagulants: Secondary | ICD-10-CM | POA: Diagnosis not present

## 2020-09-06 DIAGNOSIS — E1165 Type 2 diabetes mellitus with hyperglycemia: Secondary | ICD-10-CM | POA: Diagnosis not present

## 2020-09-06 DIAGNOSIS — I10 Essential (primary) hypertension: Secondary | ICD-10-CM | POA: Diagnosis not present

## 2020-09-07 DIAGNOSIS — R531 Weakness: Secondary | ICD-10-CM | POA: Diagnosis not present

## 2020-09-07 DIAGNOSIS — R1902 Left upper quadrant abdominal swelling, mass and lump: Secondary | ICD-10-CM | POA: Diagnosis not present

## 2020-09-07 DIAGNOSIS — E271 Primary adrenocortical insufficiency: Secondary | ICD-10-CM | POA: Diagnosis not present

## 2020-09-07 DIAGNOSIS — R001 Bradycardia, unspecified: Secondary | ICD-10-CM | POA: Diagnosis not present

## 2020-09-07 DIAGNOSIS — I480 Paroxysmal atrial fibrillation: Secondary | ICD-10-CM | POA: Diagnosis not present

## 2020-09-07 DIAGNOSIS — I509 Heart failure, unspecified: Secondary | ICD-10-CM | POA: Diagnosis not present

## 2020-09-07 DIAGNOSIS — Z7901 Long term (current) use of anticoagulants: Secondary | ICD-10-CM | POA: Diagnosis not present

## 2020-09-08 DIAGNOSIS — G4733 Obstructive sleep apnea (adult) (pediatric): Secondary | ICD-10-CM | POA: Diagnosis not present

## 2020-09-08 DIAGNOSIS — Z7901 Long term (current) use of anticoagulants: Secondary | ICD-10-CM | POA: Diagnosis not present

## 2020-09-08 DIAGNOSIS — C499 Malignant neoplasm of connective and soft tissue, unspecified: Secondary | ICD-10-CM | POA: Diagnosis not present

## 2020-09-08 DIAGNOSIS — R001 Bradycardia, unspecified: Secondary | ICD-10-CM | POA: Diagnosis not present

## 2020-09-08 DIAGNOSIS — Z933 Colostomy status: Secondary | ICD-10-CM | POA: Diagnosis not present

## 2020-09-08 DIAGNOSIS — I509 Heart failure, unspecified: Secondary | ICD-10-CM | POA: Diagnosis not present

## 2020-09-08 DIAGNOSIS — E1165 Type 2 diabetes mellitus with hyperglycemia: Secondary | ICD-10-CM | POA: Diagnosis not present

## 2020-09-08 DIAGNOSIS — I4819 Other persistent atrial fibrillation: Secondary | ICD-10-CM | POA: Diagnosis not present

## 2020-09-09 DIAGNOSIS — R531 Weakness: Secondary | ICD-10-CM | POA: Diagnosis not present

## 2020-09-09 DIAGNOSIS — E271 Primary adrenocortical insufficiency: Secondary | ICD-10-CM | POA: Diagnosis not present

## 2020-09-09 DIAGNOSIS — Z7901 Long term (current) use of anticoagulants: Secondary | ICD-10-CM | POA: Diagnosis not present

## 2020-09-09 DIAGNOSIS — R1902 Left upper quadrant abdominal swelling, mass and lump: Secondary | ICD-10-CM | POA: Diagnosis not present

## 2020-09-09 DIAGNOSIS — I480 Paroxysmal atrial fibrillation: Secondary | ICD-10-CM | POA: Diagnosis not present

## 2020-09-14 DIAGNOSIS — I472 Ventricular tachycardia: Secondary | ICD-10-CM | POA: Diagnosis not present

## 2020-09-14 DIAGNOSIS — I480 Paroxysmal atrial fibrillation: Secondary | ICD-10-CM | POA: Diagnosis not present

## 2020-09-16 DIAGNOSIS — R9431 Abnormal electrocardiogram [ECG] [EKG]: Secondary | ICD-10-CM | POA: Diagnosis not present

## 2020-09-16 DIAGNOSIS — R001 Bradycardia, unspecified: Secondary | ICD-10-CM | POA: Diagnosis not present

## 2020-09-16 DIAGNOSIS — I25810 Atherosclerosis of coronary artery bypass graft(s) without angina pectoris: Secondary | ICD-10-CM | POA: Diagnosis not present

## 2020-09-16 DIAGNOSIS — E271 Primary adrenocortical insufficiency: Secondary | ICD-10-CM | POA: Diagnosis not present

## 2020-09-16 DIAGNOSIS — I10 Essential (primary) hypertension: Secondary | ICD-10-CM | POA: Diagnosis not present

## 2020-09-16 DIAGNOSIS — I5032 Chronic diastolic (congestive) heart failure: Secondary | ICD-10-CM | POA: Diagnosis not present

## 2020-09-16 DIAGNOSIS — E7800 Pure hypercholesterolemia, unspecified: Secondary | ICD-10-CM | POA: Diagnosis not present

## 2020-09-16 DIAGNOSIS — I480 Paroxysmal atrial fibrillation: Secondary | ICD-10-CM | POA: Diagnosis not present

## 2020-09-16 DIAGNOSIS — K632 Fistula of intestine: Secondary | ICD-10-CM | POA: Diagnosis not present

## 2020-09-20 DIAGNOSIS — I4891 Unspecified atrial fibrillation: Secondary | ICD-10-CM | POA: Diagnosis not present

## 2020-09-20 DIAGNOSIS — E785 Hyperlipidemia, unspecified: Secondary | ICD-10-CM | POA: Diagnosis not present

## 2020-09-20 DIAGNOSIS — M6281 Muscle weakness (generalized): Secondary | ICD-10-CM | POA: Diagnosis not present

## 2020-09-20 DIAGNOSIS — I10 Essential (primary) hypertension: Secondary | ICD-10-CM | POA: Diagnosis not present

## 2020-09-20 DIAGNOSIS — R262 Difficulty in walking, not elsewhere classified: Secondary | ICD-10-CM | POA: Diagnosis not present

## 2020-09-22 DIAGNOSIS — I480 Paroxysmal atrial fibrillation: Secondary | ICD-10-CM | POA: Diagnosis not present

## 2020-09-22 DIAGNOSIS — I10 Essential (primary) hypertension: Secondary | ICD-10-CM | POA: Diagnosis not present

## 2020-09-22 DIAGNOSIS — I472 Ventricular tachycardia: Secondary | ICD-10-CM | POA: Diagnosis not present

## 2020-09-22 DIAGNOSIS — I25810 Atherosclerosis of coronary artery bypass graft(s) without angina pectoris: Secondary | ICD-10-CM | POA: Diagnosis not present

## 2020-09-24 DIAGNOSIS — K439 Ventral hernia without obstruction or gangrene: Secondary | ICD-10-CM | POA: Diagnosis not present

## 2020-09-24 DIAGNOSIS — E559 Vitamin D deficiency, unspecified: Secondary | ICD-10-CM | POA: Diagnosis not present

## 2020-09-24 DIAGNOSIS — E271 Primary adrenocortical insufficiency: Secondary | ICD-10-CM | POA: Diagnosis not present

## 2020-09-24 DIAGNOSIS — F331 Major depressive disorder, recurrent, moderate: Secondary | ICD-10-CM | POA: Diagnosis not present

## 2020-09-24 DIAGNOSIS — I480 Paroxysmal atrial fibrillation: Secondary | ICD-10-CM | POA: Diagnosis not present

## 2020-09-24 DIAGNOSIS — I10 Essential (primary) hypertension: Secondary | ICD-10-CM | POA: Diagnosis not present

## 2020-09-24 DIAGNOSIS — I2510 Atherosclerotic heart disease of native coronary artery without angina pectoris: Secondary | ICD-10-CM | POA: Diagnosis not present

## 2020-09-24 DIAGNOSIS — E039 Hypothyroidism, unspecified: Secondary | ICD-10-CM | POA: Diagnosis not present

## 2020-09-24 DIAGNOSIS — E785 Hyperlipidemia, unspecified: Secondary | ICD-10-CM | POA: Diagnosis not present

## 2020-09-27 DIAGNOSIS — Z933 Colostomy status: Secondary | ICD-10-CM | POA: Diagnosis not present

## 2020-09-27 DIAGNOSIS — K5732 Diverticulitis of large intestine without perforation or abscess without bleeding: Secondary | ICD-10-CM | POA: Diagnosis not present

## 2020-09-27 DIAGNOSIS — N321 Vesicointestinal fistula: Secondary | ICD-10-CM | POA: Diagnosis not present

## 2020-09-28 DIAGNOSIS — I10 Essential (primary) hypertension: Secondary | ICD-10-CM | POA: Diagnosis not present

## 2020-09-28 DIAGNOSIS — M6281 Muscle weakness (generalized): Secondary | ICD-10-CM | POA: Diagnosis not present

## 2020-09-28 DIAGNOSIS — R262 Difficulty in walking, not elsewhere classified: Secondary | ICD-10-CM | POA: Diagnosis not present

## 2020-09-28 DIAGNOSIS — I4891 Unspecified atrial fibrillation: Secondary | ICD-10-CM | POA: Diagnosis not present

## 2020-09-28 DIAGNOSIS — E785 Hyperlipidemia, unspecified: Secondary | ICD-10-CM | POA: Diagnosis not present

## 2020-09-30 DIAGNOSIS — I4891 Unspecified atrial fibrillation: Secondary | ICD-10-CM | POA: Diagnosis not present

## 2020-09-30 DIAGNOSIS — E785 Hyperlipidemia, unspecified: Secondary | ICD-10-CM | POA: Diagnosis not present

## 2020-09-30 DIAGNOSIS — M6281 Muscle weakness (generalized): Secondary | ICD-10-CM | POA: Diagnosis not present

## 2020-09-30 DIAGNOSIS — R262 Difficulty in walking, not elsewhere classified: Secondary | ICD-10-CM | POA: Diagnosis not present

## 2020-09-30 DIAGNOSIS — I10 Essential (primary) hypertension: Secondary | ICD-10-CM | POA: Diagnosis not present

## 2020-10-08 DIAGNOSIS — I10 Essential (primary) hypertension: Secondary | ICD-10-CM | POA: Diagnosis not present

## 2020-10-08 DIAGNOSIS — E785 Hyperlipidemia, unspecified: Secondary | ICD-10-CM | POA: Diagnosis not present

## 2020-10-08 DIAGNOSIS — R262 Difficulty in walking, not elsewhere classified: Secondary | ICD-10-CM | POA: Diagnosis not present

## 2020-10-08 DIAGNOSIS — M6281 Muscle weakness (generalized): Secondary | ICD-10-CM | POA: Diagnosis not present

## 2020-10-08 DIAGNOSIS — I4891 Unspecified atrial fibrillation: Secondary | ICD-10-CM | POA: Diagnosis not present

## 2020-10-12 DIAGNOSIS — I10 Essential (primary) hypertension: Secondary | ICD-10-CM | POA: Diagnosis not present

## 2020-10-12 DIAGNOSIS — E785 Hyperlipidemia, unspecified: Secondary | ICD-10-CM | POA: Diagnosis not present

## 2020-10-12 DIAGNOSIS — M6281 Muscle weakness (generalized): Secondary | ICD-10-CM | POA: Diagnosis not present

## 2020-10-12 DIAGNOSIS — I4891 Unspecified atrial fibrillation: Secondary | ICD-10-CM | POA: Diagnosis not present

## 2020-10-12 DIAGNOSIS — R262 Difficulty in walking, not elsewhere classified: Secondary | ICD-10-CM | POA: Diagnosis not present

## 2020-10-20 DIAGNOSIS — G4730 Sleep apnea, unspecified: Secondary | ICD-10-CM | POA: Diagnosis not present

## 2020-10-20 DIAGNOSIS — Z882 Allergy status to sulfonamides status: Secondary | ICD-10-CM | POA: Diagnosis not present

## 2020-10-20 DIAGNOSIS — I4891 Unspecified atrial fibrillation: Secondary | ICD-10-CM | POA: Diagnosis not present

## 2020-10-20 DIAGNOSIS — I10 Essential (primary) hypertension: Secondary | ICD-10-CM | POA: Diagnosis not present

## 2020-10-20 DIAGNOSIS — E785 Hyperlipidemia, unspecified: Secondary | ICD-10-CM | POA: Diagnosis not present

## 2020-10-20 DIAGNOSIS — Z951 Presence of aortocoronary bypass graft: Secondary | ICD-10-CM | POA: Diagnosis not present

## 2020-10-20 DIAGNOSIS — I2510 Atherosclerotic heart disease of native coronary artery without angina pectoris: Secondary | ICD-10-CM | POA: Diagnosis not present

## 2020-10-20 DIAGNOSIS — Z96653 Presence of artificial knee joint, bilateral: Secondary | ICD-10-CM | POA: Diagnosis not present

## 2020-10-20 DIAGNOSIS — I472 Ventricular tachycardia: Secondary | ICD-10-CM | POA: Diagnosis not present

## 2020-10-27 DIAGNOSIS — I10 Essential (primary) hypertension: Secondary | ICD-10-CM | POA: Diagnosis not present

## 2020-10-27 DIAGNOSIS — Z933 Colostomy status: Secondary | ICD-10-CM | POA: Diagnosis not present

## 2020-10-27 DIAGNOSIS — Z7901 Long term (current) use of anticoagulants: Secondary | ICD-10-CM | POA: Diagnosis not present

## 2020-10-27 DIAGNOSIS — R1900 Intra-abdominal and pelvic swelling, mass and lump, unspecified site: Secondary | ICD-10-CM | POA: Diagnosis not present

## 2020-10-27 DIAGNOSIS — K435 Parastomal hernia without obstruction or  gangrene: Secondary | ICD-10-CM | POA: Diagnosis not present

## 2020-10-27 DIAGNOSIS — I480 Paroxysmal atrial fibrillation: Secondary | ICD-10-CM | POA: Diagnosis not present

## 2020-10-28 DIAGNOSIS — I10 Essential (primary) hypertension: Secondary | ICD-10-CM | POA: Diagnosis not present

## 2020-11-08 DIAGNOSIS — M1990 Unspecified osteoarthritis, unspecified site: Secondary | ICD-10-CM | POA: Diagnosis not present

## 2020-11-08 DIAGNOSIS — I4891 Unspecified atrial fibrillation: Secondary | ICD-10-CM | POA: Diagnosis not present

## 2020-11-08 DIAGNOSIS — I959 Hypotension, unspecified: Secondary | ICD-10-CM | POA: Diagnosis not present

## 2020-11-08 DIAGNOSIS — E785 Hyperlipidemia, unspecified: Secondary | ICD-10-CM | POA: Diagnosis not present

## 2020-11-08 DIAGNOSIS — E271 Primary adrenocortical insufficiency: Secondary | ICD-10-CM | POA: Diagnosis not present

## 2020-11-08 DIAGNOSIS — I2510 Atherosclerotic heart disease of native coronary artery without angina pectoris: Secondary | ICD-10-CM | POA: Diagnosis not present

## 2020-11-08 DIAGNOSIS — R531 Weakness: Secondary | ICD-10-CM | POA: Diagnosis not present

## 2020-11-08 DIAGNOSIS — E1169 Type 2 diabetes mellitus with other specified complication: Secondary | ICD-10-CM | POA: Diagnosis not present

## 2020-11-08 DIAGNOSIS — R42 Dizziness and giddiness: Secondary | ICD-10-CM | POA: Diagnosis not present

## 2020-11-10 DIAGNOSIS — Z0000 Encounter for general adult medical examination without abnormal findings: Secondary | ICD-10-CM | POA: Diagnosis not present

## 2020-11-10 DIAGNOSIS — I2510 Atherosclerotic heart disease of native coronary artery without angina pectoris: Secondary | ICD-10-CM | POA: Diagnosis not present

## 2020-11-10 DIAGNOSIS — R531 Weakness: Secondary | ICD-10-CM | POA: Diagnosis not present

## 2020-11-10 DIAGNOSIS — R42 Dizziness and giddiness: Secondary | ICD-10-CM | POA: Diagnosis not present

## 2020-11-10 DIAGNOSIS — I509 Heart failure, unspecified: Secondary | ICD-10-CM | POA: Diagnosis not present

## 2020-11-11 DIAGNOSIS — R1909 Other intra-abdominal and pelvic swelling, mass and lump: Secondary | ICD-10-CM | POA: Diagnosis not present

## 2020-11-11 DIAGNOSIS — K439 Ventral hernia without obstruction or gangrene: Secondary | ICD-10-CM | POA: Diagnosis not present

## 2020-11-11 DIAGNOSIS — Z933 Colostomy status: Secondary | ICD-10-CM | POA: Diagnosis not present

## 2020-11-11 DIAGNOSIS — K435 Parastomal hernia without obstruction or  gangrene: Secondary | ICD-10-CM | POA: Diagnosis not present

## 2020-11-11 DIAGNOSIS — R1900 Intra-abdominal and pelvic swelling, mass and lump, unspecified site: Secondary | ICD-10-CM | POA: Diagnosis not present

## 2020-11-11 DIAGNOSIS — E876 Hypokalemia: Secondary | ICD-10-CM | POA: Diagnosis not present

## 2020-11-15 DIAGNOSIS — E876 Hypokalemia: Secondary | ICD-10-CM | POA: Diagnosis not present

## 2020-11-17 DIAGNOSIS — Z433 Encounter for attention to colostomy: Secondary | ICD-10-CM | POA: Diagnosis not present

## 2020-11-19 DIAGNOSIS — R001 Bradycardia, unspecified: Secondary | ICD-10-CM | POA: Diagnosis not present

## 2020-11-19 DIAGNOSIS — I25810 Atherosclerosis of coronary artery bypass graft(s) without angina pectoris: Secondary | ICD-10-CM | POA: Diagnosis not present

## 2020-11-19 DIAGNOSIS — I480 Paroxysmal atrial fibrillation: Secondary | ICD-10-CM | POA: Diagnosis not present

## 2020-11-19 DIAGNOSIS — I10 Essential (primary) hypertension: Secondary | ICD-10-CM | POA: Diagnosis not present

## 2020-12-17 DIAGNOSIS — Z7952 Long term (current) use of systemic steroids: Secondary | ICD-10-CM | POA: Diagnosis not present

## 2020-12-17 DIAGNOSIS — I10 Essential (primary) hypertension: Secondary | ICD-10-CM | POA: Diagnosis not present

## 2020-12-17 DIAGNOSIS — I480 Paroxysmal atrial fibrillation: Secondary | ICD-10-CM | POA: Diagnosis not present

## 2020-12-17 DIAGNOSIS — E782 Mixed hyperlipidemia: Secondary | ICD-10-CM | POA: Diagnosis not present

## 2020-12-17 DIAGNOSIS — R1900 Intra-abdominal and pelvic swelling, mass and lump, unspecified site: Secondary | ICD-10-CM | POA: Diagnosis not present

## 2020-12-17 DIAGNOSIS — K435 Parastomal hernia without obstruction or  gangrene: Secondary | ICD-10-CM | POA: Diagnosis not present

## 2020-12-17 DIAGNOSIS — Z01812 Encounter for preprocedural laboratory examination: Secondary | ICD-10-CM | POA: Diagnosis not present

## 2020-12-17 DIAGNOSIS — Z933 Colostomy status: Secondary | ICD-10-CM | POA: Diagnosis not present

## 2020-12-17 DIAGNOSIS — E271 Primary adrenocortical insufficiency: Secondary | ICD-10-CM | POA: Diagnosis not present

## 2020-12-17 DIAGNOSIS — R002 Palpitations: Secondary | ICD-10-CM | POA: Diagnosis not present

## 2020-12-17 DIAGNOSIS — I25810 Atherosclerosis of coronary artery bypass graft(s) without angina pectoris: Secondary | ICD-10-CM | POA: Diagnosis not present

## 2020-12-22 DIAGNOSIS — N321 Vesicointestinal fistula: Secondary | ICD-10-CM | POA: Diagnosis not present

## 2020-12-22 DIAGNOSIS — K5732 Diverticulitis of large intestine without perforation or abscess without bleeding: Secondary | ICD-10-CM | POA: Diagnosis not present

## 2020-12-22 DIAGNOSIS — Z933 Colostomy status: Secondary | ICD-10-CM | POA: Diagnosis not present

## 2020-12-23 DIAGNOSIS — Z7901 Long term (current) use of anticoagulants: Secondary | ICD-10-CM | POA: Diagnosis not present

## 2020-12-23 DIAGNOSIS — R1904 Left lower quadrant abdominal swelling, mass and lump: Secondary | ICD-10-CM | POA: Diagnosis not present

## 2020-12-23 DIAGNOSIS — K435 Parastomal hernia without obstruction or  gangrene: Secondary | ICD-10-CM | POA: Diagnosis not present

## 2020-12-23 DIAGNOSIS — I480 Paroxysmal atrial fibrillation: Secondary | ICD-10-CM | POA: Diagnosis not present

## 2020-12-23 DIAGNOSIS — E271 Primary adrenocortical insufficiency: Secondary | ICD-10-CM | POA: Diagnosis not present

## 2020-12-23 DIAGNOSIS — E876 Hypokalemia: Secondary | ICD-10-CM | POA: Diagnosis not present

## 2020-12-23 DIAGNOSIS — Z933 Colostomy status: Secondary | ICD-10-CM | POA: Diagnosis not present

## 2020-12-23 DIAGNOSIS — I10 Essential (primary) hypertension: Secondary | ICD-10-CM | POA: Diagnosis not present

## 2020-12-24 DIAGNOSIS — Z933 Colostomy status: Secondary | ICD-10-CM | POA: Diagnosis not present

## 2020-12-24 DIAGNOSIS — N321 Vesicointestinal fistula: Secondary | ICD-10-CM | POA: Diagnosis not present

## 2020-12-24 DIAGNOSIS — K5732 Diverticulitis of large intestine without perforation or abscess without bleeding: Secondary | ICD-10-CM | POA: Diagnosis not present

## 2020-12-28 DIAGNOSIS — E271 Primary adrenocortical insufficiency: Secondary | ICD-10-CM | POA: Diagnosis not present

## 2020-12-28 DIAGNOSIS — K5732 Diverticulitis of large intestine without perforation or abscess without bleeding: Secondary | ICD-10-CM | POA: Diagnosis not present

## 2020-12-28 DIAGNOSIS — E1169 Type 2 diabetes mellitus with other specified complication: Secondary | ICD-10-CM | POA: Diagnosis not present

## 2020-12-28 DIAGNOSIS — Z933 Colostomy status: Secondary | ICD-10-CM | POA: Diagnosis not present

## 2020-12-28 DIAGNOSIS — E669 Obesity, unspecified: Secondary | ICD-10-CM | POA: Diagnosis not present

## 2020-12-28 DIAGNOSIS — I10 Essential (primary) hypertension: Secondary | ICD-10-CM | POA: Diagnosis not present

## 2020-12-28 DIAGNOSIS — E039 Hypothyroidism, unspecified: Secondary | ICD-10-CM | POA: Diagnosis not present

## 2020-12-28 DIAGNOSIS — N321 Vesicointestinal fistula: Secondary | ICD-10-CM | POA: Diagnosis not present

## 2020-12-29 DIAGNOSIS — E271 Primary adrenocortical insufficiency: Secondary | ICD-10-CM | POA: Diagnosis not present

## 2020-12-29 DIAGNOSIS — F331 Major depressive disorder, recurrent, moderate: Secondary | ICD-10-CM | POA: Diagnosis not present

## 2020-12-29 DIAGNOSIS — I480 Paroxysmal atrial fibrillation: Secondary | ICD-10-CM | POA: Diagnosis not present

## 2020-12-29 DIAGNOSIS — I509 Heart failure, unspecified: Secondary | ICD-10-CM | POA: Diagnosis not present

## 2020-12-29 DIAGNOSIS — I10 Essential (primary) hypertension: Secondary | ICD-10-CM | POA: Diagnosis not present

## 2020-12-29 DIAGNOSIS — K439 Ventral hernia without obstruction or gangrene: Secondary | ICD-10-CM | POA: Diagnosis not present

## 2020-12-29 DIAGNOSIS — R1900 Intra-abdominal and pelvic swelling, mass and lump, unspecified site: Secondary | ICD-10-CM | POA: Diagnosis not present

## 2020-12-29 DIAGNOSIS — M48061 Spinal stenosis, lumbar region without neurogenic claudication: Secondary | ICD-10-CM | POA: Diagnosis not present

## 2020-12-29 DIAGNOSIS — Z933 Colostomy status: Secondary | ICD-10-CM | POA: Diagnosis not present

## 2021-01-10 DIAGNOSIS — K661 Hemoperitoneum: Secondary | ICD-10-CM | POA: Diagnosis not present

## 2021-01-10 DIAGNOSIS — E559 Vitamin D deficiency, unspecified: Secondary | ICD-10-CM | POA: Diagnosis not present

## 2021-01-10 DIAGNOSIS — G8918 Other acute postprocedural pain: Secondary | ICD-10-CM | POA: Diagnosis not present

## 2021-01-10 DIAGNOSIS — K432 Incisional hernia without obstruction or gangrene: Secondary | ICD-10-CM | POA: Diagnosis not present

## 2021-01-10 DIAGNOSIS — G4730 Sleep apnea, unspecified: Secondary | ICD-10-CM | POA: Diagnosis not present

## 2021-01-10 DIAGNOSIS — Z96653 Presence of artificial knee joint, bilateral: Secondary | ICD-10-CM | POA: Diagnosis not present

## 2021-01-10 DIAGNOSIS — I509 Heart failure, unspecified: Secondary | ICD-10-CM | POA: Diagnosis not present

## 2021-01-10 DIAGNOSIS — K660 Peritoneal adhesions (postprocedural) (postinfection): Secondary | ICD-10-CM | POA: Diagnosis not present

## 2021-01-10 DIAGNOSIS — R531 Weakness: Secondary | ICD-10-CM | POA: Diagnosis not present

## 2021-01-10 DIAGNOSIS — E271 Primary adrenocortical insufficiency: Secondary | ICD-10-CM | POA: Diagnosis not present

## 2021-01-10 DIAGNOSIS — K435 Parastomal hernia without obstruction or  gangrene: Secondary | ICD-10-CM | POA: Diagnosis not present

## 2021-01-10 DIAGNOSIS — R262 Difficulty in walking, not elsewhere classified: Secondary | ICD-10-CM | POA: Diagnosis not present

## 2021-01-10 DIAGNOSIS — E039 Hypothyroidism, unspecified: Secondary | ICD-10-CM | POA: Diagnosis not present

## 2021-01-10 DIAGNOSIS — K219 Gastro-esophageal reflux disease without esophagitis: Secondary | ICD-10-CM | POA: Diagnosis not present

## 2021-01-10 DIAGNOSIS — M1990 Unspecified osteoarthritis, unspecified site: Secondary | ICD-10-CM | POA: Diagnosis not present

## 2021-01-10 DIAGNOSIS — I10 Essential (primary) hypertension: Secondary | ICD-10-CM | POA: Diagnosis not present

## 2021-01-10 DIAGNOSIS — J8489 Other specified interstitial pulmonary diseases: Secondary | ICD-10-CM | POA: Diagnosis not present

## 2021-01-10 DIAGNOSIS — D179 Benign lipomatous neoplasm, unspecified: Secondary | ICD-10-CM | POA: Diagnosis not present

## 2021-01-10 DIAGNOSIS — J90 Pleural effusion, not elsewhere classified: Secondary | ICD-10-CM | POA: Diagnosis not present

## 2021-01-10 DIAGNOSIS — I4891 Unspecified atrial fibrillation: Secondary | ICD-10-CM | POA: Diagnosis not present

## 2021-01-10 DIAGNOSIS — D1779 Benign lipomatous neoplasm of other sites: Secondary | ICD-10-CM | POA: Diagnosis not present

## 2021-01-10 DIAGNOSIS — I2510 Atherosclerotic heart disease of native coronary artery without angina pectoris: Secondary | ICD-10-CM | POA: Diagnosis not present

## 2021-01-10 DIAGNOSIS — F331 Major depressive disorder, recurrent, moderate: Secondary | ICD-10-CM | POA: Diagnosis not present

## 2021-01-10 DIAGNOSIS — R2681 Unsteadiness on feet: Secondary | ICD-10-CM | POA: Diagnosis not present

## 2021-01-10 DIAGNOSIS — R1909 Other intra-abdominal and pelvic swelling, mass and lump: Secondary | ICD-10-CM | POA: Diagnosis not present

## 2021-01-10 DIAGNOSIS — K689 Other disorders of retroperitoneum: Secondary | ICD-10-CM | POA: Diagnosis not present

## 2021-01-10 DIAGNOSIS — E119 Type 2 diabetes mellitus without complications: Secondary | ICD-10-CM | POA: Diagnosis not present

## 2021-01-10 DIAGNOSIS — E669 Obesity, unspecified: Secondary | ICD-10-CM | POA: Diagnosis not present

## 2021-01-10 DIAGNOSIS — F419 Anxiety disorder, unspecified: Secondary | ICD-10-CM | POA: Diagnosis not present

## 2021-01-10 DIAGNOSIS — R0602 Shortness of breath: Secondary | ICD-10-CM | POA: Diagnosis not present

## 2021-01-10 DIAGNOSIS — E785 Hyperlipidemia, unspecified: Secondary | ICD-10-CM | POA: Diagnosis not present

## 2021-01-20 DIAGNOSIS — D649 Anemia, unspecified: Secondary | ICD-10-CM | POA: Diagnosis not present

## 2021-01-20 DIAGNOSIS — Z9889 Other specified postprocedural states: Secondary | ICD-10-CM | POA: Diagnosis not present

## 2021-01-20 DIAGNOSIS — R1900 Intra-abdominal and pelvic swelling, mass and lump, unspecified site: Secondary | ICD-10-CM | POA: Diagnosis not present

## 2021-01-20 DIAGNOSIS — K435 Parastomal hernia without obstruction or  gangrene: Secondary | ICD-10-CM | POA: Diagnosis not present

## 2021-01-20 DIAGNOSIS — I480 Paroxysmal atrial fibrillation: Secondary | ICD-10-CM | POA: Diagnosis not present

## 2021-01-20 DIAGNOSIS — R2681 Unsteadiness on feet: Secondary | ICD-10-CM | POA: Diagnosis not present

## 2021-01-20 DIAGNOSIS — E559 Vitamin D deficiency, unspecified: Secondary | ICD-10-CM | POA: Diagnosis not present

## 2021-01-20 DIAGNOSIS — M47816 Spondylosis without myelopathy or radiculopathy, lumbar region: Secondary | ICD-10-CM | POA: Diagnosis not present

## 2021-01-20 DIAGNOSIS — I25810 Atherosclerosis of coronary artery bypass graft(s) without angina pectoris: Secondary | ICD-10-CM | POA: Diagnosis not present

## 2021-01-20 DIAGNOSIS — I10 Essential (primary) hypertension: Secondary | ICD-10-CM | POA: Diagnosis not present

## 2021-01-20 DIAGNOSIS — R1904 Left lower quadrant abdominal swelling, mass and lump: Secondary | ICD-10-CM | POA: Diagnosis not present

## 2021-01-20 DIAGNOSIS — F419 Anxiety disorder, unspecified: Secondary | ICD-10-CM | POA: Diagnosis not present

## 2021-01-20 DIAGNOSIS — R531 Weakness: Secondary | ICD-10-CM | POA: Diagnosis not present

## 2021-01-20 DIAGNOSIS — E271 Primary adrenocortical insufficiency: Secondary | ICD-10-CM | POA: Diagnosis not present

## 2021-01-20 DIAGNOSIS — K5901 Slow transit constipation: Secondary | ICD-10-CM | POA: Diagnosis not present

## 2021-01-20 DIAGNOSIS — I509 Heart failure, unspecified: Secondary | ICD-10-CM | POA: Diagnosis not present

## 2021-01-20 DIAGNOSIS — F331 Major depressive disorder, recurrent, moderate: Secondary | ICD-10-CM | POA: Diagnosis not present

## 2021-01-20 DIAGNOSIS — K632 Fistula of intestine: Secondary | ICD-10-CM | POA: Diagnosis not present

## 2021-01-20 DIAGNOSIS — R262 Difficulty in walking, not elsewhere classified: Secondary | ICD-10-CM | POA: Diagnosis not present

## 2021-01-20 DIAGNOSIS — G2581 Restless legs syndrome: Secondary | ICD-10-CM | POA: Diagnosis not present

## 2021-01-20 DIAGNOSIS — Z933 Colostomy status: Secondary | ICD-10-CM | POA: Diagnosis not present

## 2021-01-21 DIAGNOSIS — K435 Parastomal hernia without obstruction or  gangrene: Secondary | ICD-10-CM | POA: Diagnosis not present

## 2021-01-21 DIAGNOSIS — F331 Major depressive disorder, recurrent, moderate: Secondary | ICD-10-CM | POA: Diagnosis not present

## 2021-01-21 DIAGNOSIS — I10 Essential (primary) hypertension: Secondary | ICD-10-CM | POA: Diagnosis not present

## 2021-01-21 DIAGNOSIS — R531 Weakness: Secondary | ICD-10-CM | POA: Diagnosis not present

## 2021-01-21 DIAGNOSIS — R1900 Intra-abdominal and pelvic swelling, mass and lump, unspecified site: Secondary | ICD-10-CM | POA: Diagnosis not present

## 2021-01-21 DIAGNOSIS — E271 Primary adrenocortical insufficiency: Secondary | ICD-10-CM | POA: Diagnosis not present

## 2021-01-21 DIAGNOSIS — E559 Vitamin D deficiency, unspecified: Secondary | ICD-10-CM | POA: Diagnosis not present

## 2021-01-21 DIAGNOSIS — I480 Paroxysmal atrial fibrillation: Secondary | ICD-10-CM | POA: Diagnosis not present

## 2021-01-24 DIAGNOSIS — K435 Parastomal hernia without obstruction or  gangrene: Secondary | ICD-10-CM | POA: Diagnosis not present

## 2021-01-24 DIAGNOSIS — G2581 Restless legs syndrome: Secondary | ICD-10-CM | POA: Diagnosis not present

## 2021-01-24 DIAGNOSIS — F419 Anxiety disorder, unspecified: Secondary | ICD-10-CM | POA: Diagnosis not present

## 2021-01-24 DIAGNOSIS — K5901 Slow transit constipation: Secondary | ICD-10-CM | POA: Diagnosis not present

## 2021-01-25 DIAGNOSIS — I25810 Atherosclerosis of coronary artery bypass graft(s) without angina pectoris: Secondary | ICD-10-CM | POA: Diagnosis not present

## 2021-01-25 DIAGNOSIS — R1904 Left lower quadrant abdominal swelling, mass and lump: Secondary | ICD-10-CM | POA: Diagnosis not present

## 2021-01-25 DIAGNOSIS — K632 Fistula of intestine: Secondary | ICD-10-CM | POA: Diagnosis not present

## 2021-01-25 DIAGNOSIS — M47816 Spondylosis without myelopathy or radiculopathy, lumbar region: Secondary | ICD-10-CM | POA: Diagnosis not present

## 2021-01-25 DIAGNOSIS — Z933 Colostomy status: Secondary | ICD-10-CM | POA: Diagnosis not present

## 2021-01-25 DIAGNOSIS — Z9889 Other specified postprocedural states: Secondary | ICD-10-CM | POA: Diagnosis not present

## 2021-01-27 DIAGNOSIS — D649 Anemia, unspecified: Secondary | ICD-10-CM | POA: Diagnosis not present

## 2021-01-27 DIAGNOSIS — F419 Anxiety disorder, unspecified: Secondary | ICD-10-CM | POA: Diagnosis not present

## 2021-01-27 DIAGNOSIS — K5901 Slow transit constipation: Secondary | ICD-10-CM | POA: Diagnosis not present

## 2021-01-27 DIAGNOSIS — R531 Weakness: Secondary | ICD-10-CM | POA: Diagnosis not present

## 2021-01-28 DIAGNOSIS — D649 Anemia, unspecified: Secondary | ICD-10-CM | POA: Diagnosis not present

## 2021-01-28 DIAGNOSIS — K5901 Slow transit constipation: Secondary | ICD-10-CM | POA: Diagnosis not present

## 2021-02-09 DIAGNOSIS — K435 Parastomal hernia without obstruction or  gangrene: Secondary | ICD-10-CM | POA: Diagnosis not present

## 2021-02-09 DIAGNOSIS — R1900 Intra-abdominal and pelvic swelling, mass and lump, unspecified site: Secondary | ICD-10-CM | POA: Diagnosis not present

## 2021-02-09 DIAGNOSIS — E039 Hypothyroidism, unspecified: Secondary | ICD-10-CM | POA: Diagnosis not present

## 2021-02-09 DIAGNOSIS — G2581 Restless legs syndrome: Secondary | ICD-10-CM | POA: Diagnosis not present

## 2021-02-09 DIAGNOSIS — I480 Paroxysmal atrial fibrillation: Secondary | ICD-10-CM | POA: Diagnosis not present

## 2021-02-09 DIAGNOSIS — Z933 Colostomy status: Secondary | ICD-10-CM | POA: Diagnosis not present

## 2021-02-09 DIAGNOSIS — I509 Heart failure, unspecified: Secondary | ICD-10-CM | POA: Diagnosis not present

## 2021-02-09 DIAGNOSIS — R531 Weakness: Secondary | ICD-10-CM | POA: Diagnosis not present

## 2021-02-15 DIAGNOSIS — E785 Hyperlipidemia, unspecified: Secondary | ICD-10-CM | POA: Diagnosis not present

## 2021-02-15 DIAGNOSIS — R293 Abnormal posture: Secondary | ICD-10-CM | POA: Diagnosis not present

## 2021-02-15 DIAGNOSIS — I10 Essential (primary) hypertension: Secondary | ICD-10-CM | POA: Diagnosis not present

## 2021-02-15 DIAGNOSIS — M6281 Muscle weakness (generalized): Secondary | ICD-10-CM | POA: Diagnosis not present

## 2021-02-16 DIAGNOSIS — Z9889 Other specified postprocedural states: Secondary | ICD-10-CM | POA: Diagnosis not present

## 2021-02-16 DIAGNOSIS — R1909 Other intra-abdominal and pelvic swelling, mass and lump: Secondary | ICD-10-CM | POA: Diagnosis not present

## 2021-02-18 DIAGNOSIS — E039 Hypothyroidism, unspecified: Secondary | ICD-10-CM | POA: Diagnosis not present

## 2021-02-18 DIAGNOSIS — F331 Major depressive disorder, recurrent, moderate: Secondary | ICD-10-CM | POA: Diagnosis not present

## 2021-02-18 DIAGNOSIS — I480 Paroxysmal atrial fibrillation: Secondary | ICD-10-CM | POA: Diagnosis not present

## 2021-02-18 DIAGNOSIS — E271 Primary adrenocortical insufficiency: Secondary | ICD-10-CM | POA: Diagnosis not present

## 2021-02-18 DIAGNOSIS — E785 Hyperlipidemia, unspecified: Secondary | ICD-10-CM | POA: Diagnosis not present

## 2021-02-18 DIAGNOSIS — K219 Gastro-esophageal reflux disease without esophagitis: Secondary | ICD-10-CM | POA: Diagnosis not present

## 2021-02-18 DIAGNOSIS — M6281 Muscle weakness (generalized): Secondary | ICD-10-CM | POA: Diagnosis not present

## 2021-02-18 DIAGNOSIS — E559 Vitamin D deficiency, unspecified: Secondary | ICD-10-CM | POA: Diagnosis not present

## 2021-02-18 DIAGNOSIS — I10 Essential (primary) hypertension: Secondary | ICD-10-CM | POA: Diagnosis not present

## 2021-02-18 DIAGNOSIS — R293 Abnormal posture: Secondary | ICD-10-CM | POA: Diagnosis not present

## 2021-02-22 DIAGNOSIS — E785 Hyperlipidemia, unspecified: Secondary | ICD-10-CM | POA: Diagnosis not present

## 2021-02-22 DIAGNOSIS — M6281 Muscle weakness (generalized): Secondary | ICD-10-CM | POA: Diagnosis not present

## 2021-02-22 DIAGNOSIS — R293 Abnormal posture: Secondary | ICD-10-CM | POA: Diagnosis not present

## 2021-02-22 DIAGNOSIS — I10 Essential (primary) hypertension: Secondary | ICD-10-CM | POA: Diagnosis not present

## 2021-03-01 DIAGNOSIS — M6281 Muscle weakness (generalized): Secondary | ICD-10-CM | POA: Diagnosis not present

## 2021-03-01 DIAGNOSIS — Z933 Colostomy status: Secondary | ICD-10-CM | POA: Diagnosis not present

## 2021-03-01 DIAGNOSIS — R109 Unspecified abdominal pain: Secondary | ICD-10-CM | POA: Diagnosis not present

## 2021-03-01 DIAGNOSIS — R293 Abnormal posture: Secondary | ICD-10-CM | POA: Diagnosis not present

## 2021-03-01 DIAGNOSIS — E785 Hyperlipidemia, unspecified: Secondary | ICD-10-CM | POA: Diagnosis not present

## 2021-03-01 DIAGNOSIS — I10 Essential (primary) hypertension: Secondary | ICD-10-CM | POA: Diagnosis not present

## 2021-03-01 DIAGNOSIS — Z79899 Other long term (current) drug therapy: Secondary | ICD-10-CM | POA: Diagnosis not present

## 2021-03-01 DIAGNOSIS — Z9889 Other specified postprocedural states: Secondary | ICD-10-CM | POA: Diagnosis not present

## 2021-03-04 DIAGNOSIS — I10 Essential (primary) hypertension: Secondary | ICD-10-CM | POA: Diagnosis not present

## 2021-03-04 DIAGNOSIS — E785 Hyperlipidemia, unspecified: Secondary | ICD-10-CM | POA: Diagnosis not present

## 2021-03-04 DIAGNOSIS — R293 Abnormal posture: Secondary | ICD-10-CM | POA: Diagnosis not present

## 2021-03-04 DIAGNOSIS — M6281 Muscle weakness (generalized): Secondary | ICD-10-CM | POA: Diagnosis not present

## 2021-03-07 DIAGNOSIS — Z79899 Other long term (current) drug therapy: Secondary | ICD-10-CM | POA: Diagnosis not present

## 2021-03-08 DIAGNOSIS — R1900 Intra-abdominal and pelvic swelling, mass and lump, unspecified site: Secondary | ICD-10-CM | POA: Diagnosis not present

## 2021-03-08 DIAGNOSIS — K432 Incisional hernia without obstruction or gangrene: Secondary | ICD-10-CM | POA: Diagnosis not present

## 2021-03-08 DIAGNOSIS — Z79899 Other long term (current) drug therapy: Secondary | ICD-10-CM | POA: Diagnosis not present

## 2021-03-08 DIAGNOSIS — R1031 Right lower quadrant pain: Secondary | ICD-10-CM | POA: Diagnosis not present

## 2021-03-08 DIAGNOSIS — R1032 Left lower quadrant pain: Secondary | ICD-10-CM | POA: Diagnosis not present

## 2021-03-08 DIAGNOSIS — Z9889 Other specified postprocedural states: Secondary | ICD-10-CM | POA: Diagnosis not present

## 2021-03-08 DIAGNOSIS — K435 Parastomal hernia without obstruction or  gangrene: Secondary | ICD-10-CM | POA: Diagnosis not present

## 2021-03-08 DIAGNOSIS — G894 Chronic pain syndrome: Secondary | ICD-10-CM | POA: Diagnosis not present

## 2021-03-09 DIAGNOSIS — D72829 Elevated white blood cell count, unspecified: Secondary | ICD-10-CM | POA: Diagnosis not present

## 2021-03-09 DIAGNOSIS — R0989 Other specified symptoms and signs involving the circulatory and respiratory systems: Secondary | ICD-10-CM | POA: Diagnosis not present

## 2021-03-10 DIAGNOSIS — Z933 Colostomy status: Secondary | ICD-10-CM | POA: Diagnosis not present

## 2021-03-10 DIAGNOSIS — K435 Parastomal hernia without obstruction or  gangrene: Secondary | ICD-10-CM | POA: Diagnosis not present

## 2021-03-10 DIAGNOSIS — D72829 Elevated white blood cell count, unspecified: Secondary | ICD-10-CM | POA: Diagnosis not present

## 2021-03-11 DIAGNOSIS — G894 Chronic pain syndrome: Secondary | ICD-10-CM | POA: Diagnosis not present

## 2021-03-11 DIAGNOSIS — M545 Low back pain, unspecified: Secondary | ICD-10-CM | POA: Diagnosis not present

## 2021-03-11 DIAGNOSIS — G8929 Other chronic pain: Secondary | ICD-10-CM | POA: Diagnosis not present

## 2021-03-11 DIAGNOSIS — I10 Essential (primary) hypertension: Secondary | ICD-10-CM | POA: Diagnosis not present

## 2021-03-11 DIAGNOSIS — Z981 Arthrodesis status: Secondary | ICD-10-CM | POA: Diagnosis not present

## 2021-03-11 DIAGNOSIS — M48061 Spinal stenosis, lumbar region without neurogenic claudication: Secondary | ICD-10-CM | POA: Diagnosis not present

## 2021-03-11 DIAGNOSIS — M47816 Spondylosis without myelopathy or radiculopathy, lumbar region: Secondary | ICD-10-CM | POA: Diagnosis not present

## 2021-03-14 DIAGNOSIS — M6281 Muscle weakness (generalized): Secondary | ICD-10-CM | POA: Diagnosis not present

## 2021-03-14 DIAGNOSIS — R293 Abnormal posture: Secondary | ICD-10-CM | POA: Diagnosis not present

## 2021-03-14 DIAGNOSIS — E785 Hyperlipidemia, unspecified: Secondary | ICD-10-CM | POA: Diagnosis not present

## 2021-03-14 DIAGNOSIS — I10 Essential (primary) hypertension: Secondary | ICD-10-CM | POA: Diagnosis not present

## 2021-03-16 DIAGNOSIS — Z79899 Other long term (current) drug therapy: Secondary | ICD-10-CM | POA: Diagnosis not present

## 2021-03-17 DIAGNOSIS — Z9889 Other specified postprocedural states: Secondary | ICD-10-CM | POA: Diagnosis not present

## 2021-03-17 DIAGNOSIS — I509 Heart failure, unspecified: Secondary | ICD-10-CM | POA: Diagnosis not present

## 2021-03-17 DIAGNOSIS — K439 Ventral hernia without obstruction or gangrene: Secondary | ICD-10-CM | POA: Diagnosis not present

## 2021-03-17 DIAGNOSIS — D72829 Elevated white blood cell count, unspecified: Secondary | ICD-10-CM | POA: Diagnosis not present

## 2021-03-17 DIAGNOSIS — R0602 Shortness of breath: Secondary | ICD-10-CM | POA: Diagnosis not present

## 2021-03-18 DIAGNOSIS — N200 Calculus of kidney: Secondary | ICD-10-CM | POA: Diagnosis not present

## 2021-03-18 DIAGNOSIS — D72829 Elevated white blood cell count, unspecified: Secondary | ICD-10-CM | POA: Diagnosis not present

## 2021-03-18 DIAGNOSIS — J479 Bronchiectasis, uncomplicated: Secondary | ICD-10-CM | POA: Diagnosis not present

## 2021-03-18 DIAGNOSIS — R109 Unspecified abdominal pain: Secondary | ICD-10-CM | POA: Diagnosis not present

## 2021-03-23 DIAGNOSIS — Z79899 Other long term (current) drug therapy: Secondary | ICD-10-CM | POA: Diagnosis not present

## 2021-03-24 DIAGNOSIS — Z9889 Other specified postprocedural states: Secondary | ICD-10-CM | POA: Diagnosis not present

## 2021-03-24 DIAGNOSIS — R1900 Intra-abdominal and pelvic swelling, mass and lump, unspecified site: Secondary | ICD-10-CM | POA: Diagnosis not present

## 2021-03-24 DIAGNOSIS — Z933 Colostomy status: Secondary | ICD-10-CM | POA: Diagnosis not present

## 2021-03-24 DIAGNOSIS — R0602 Shortness of breath: Secondary | ICD-10-CM | POA: Diagnosis not present

## 2021-03-24 DIAGNOSIS — D72829 Elevated white blood cell count, unspecified: Secondary | ICD-10-CM | POA: Diagnosis not present

## 2021-11-09 ENCOUNTER — Encounter (INDEPENDENT_AMBULATORY_CARE_PROVIDER_SITE_OTHER): Payer: Self-pay

## 2024-03-03 DEATH — deceased
